# Patient Record
Sex: Female | Born: 1945
Health system: Southern US, Community
[De-identification: ages and names within clinical notes are randomized; demographics above are authoritative.]

## PROBLEM LIST (undated history)

## (undated) DIAGNOSIS — Z8719 Personal history of other diseases of the digestive system: Secondary | ICD-10-CM

## (undated) DIAGNOSIS — I10 Essential (primary) hypertension: Secondary | ICD-10-CM

## (undated) DIAGNOSIS — K219 Gastro-esophageal reflux disease without esophagitis: Secondary | ICD-10-CM

## (undated) DIAGNOSIS — IMO0002 Reserved for concepts with insufficient information to code with codable children: Secondary | ICD-10-CM

## (undated) DIAGNOSIS — C3490 Malignant neoplasm of unspecified part of unspecified bronchus or lung: Secondary | ICD-10-CM

## (undated) DIAGNOSIS — L309 Dermatitis, unspecified: Secondary | ICD-10-CM

## (undated) DIAGNOSIS — E785 Hyperlipidemia, unspecified: Secondary | ICD-10-CM

## (undated) DIAGNOSIS — C140 Malignant neoplasm of pharynx, unspecified: Secondary | ICD-10-CM

## (undated) DIAGNOSIS — R070 Pain in throat: Principal | ICD-10-CM

## (undated) DIAGNOSIS — A4902 Methicillin resistant Staphylococcus aureus infection, unspecified site: Secondary | ICD-10-CM

## (undated) DIAGNOSIS — Z923 Personal history of irradiation: Secondary | ICD-10-CM

## (undated) DIAGNOSIS — R112 Nausea with vomiting, unspecified: Secondary | ICD-10-CM

## (undated) DIAGNOSIS — D638 Anemia in other chronic diseases classified elsewhere: Principal | ICD-10-CM

## (undated) DIAGNOSIS — R110 Nausea: Principal | ICD-10-CM

## (undated) DIAGNOSIS — C01 Malignant neoplasm of base of tongue: Secondary | ICD-10-CM

## (undated) HISTORY — DX: Anemia in other chronic diseases classified elsewhere: D63.8

## (undated) HISTORY — PX: UPPER GASTROINTESTINAL ENDOSCOPY: SHX188

## (undated) HISTORY — DX: Essential (primary) hypertension: I10

## (undated) HISTORY — DX: Dermatitis, unspecified: L30.9

## (undated) HISTORY — DX: Hyperlipidemia, unspecified: E78.5

## (undated) HISTORY — DX: Malignant neoplasm of pharynx, unspecified: C14.0

## (undated) HISTORY — DX: Nausea with vomiting, unspecified: R11.2

## (undated) HISTORY — DX: Methicillin resistant Staphylococcus aureus infection, unspecified site: A49.02

## (undated) HISTORY — DX: Personal history of other diseases of the digestive system: Z87.19

## (undated) HISTORY — PX: OTHER SURGICAL HISTORY: SHX169

## (undated) HISTORY — DX: Nausea: R11.0

## (undated) HISTORY — DX: Reserved for concepts with insufficient information to code with codable children: IMO0002

## (undated) HISTORY — DX: Pain in throat: R07.0

## (undated) HISTORY — DX: Malignant neoplasm of base of tongue: C01

## (undated) HISTORY — PX: TONSILLECTOMY: SUR1361

## (undated) HISTORY — PX: INCONTINENCE SURGERY: SHX676

## (undated) HISTORY — PX: RECTOCELE REPAIR: SHX761

---

## 1988-09-26 HISTORY — PX: ABDOMINAL HYSTERECTOMY: SHX81

## 2004-09-09 ENCOUNTER — Encounter: Admission: RE | Admit: 2004-09-09 | Discharge: 2004-09-09 | Payer: Self-pay | Admitting: Occupational Medicine

## 2011-10-05 ENCOUNTER — Encounter (HOSPITAL_COMMUNITY): Payer: Self-pay | Admitting: Cardiology

## 2011-10-05 ENCOUNTER — Emergency Department (HOSPITAL_COMMUNITY)
Admission: EM | Admit: 2011-10-05 | Discharge: 2011-10-05 | Disposition: A | Discharge status: Home / Self Care | Payer: 59 | Source: Home / Self Care

## 2011-10-05 DIAGNOSIS — J069 Acute upper respiratory infection, unspecified: Secondary | ICD-10-CM

## 2011-10-05 DIAGNOSIS — J209 Acute bronchitis, unspecified: Secondary | ICD-10-CM

## 2011-10-05 MED ORDER — GUAIFENESIN-CODEINE 100-10 MG/5ML PO SYRP: ORAL_SOLUTION | ORAL | Status: AC

## 2011-10-05 MED ORDER — AMOXICILLIN-POT CLAVULANATE 875-125 MG PO TABS: 1.0000 | ORAL_TABLET | Freq: Two times a day (BID) | ORAL | Status: AC

## 2011-10-05 NOTE — ED Provider Notes (Signed)
History     CSN: [id number]  Arrival date & time 10/05/11  1109   None     Chief Complaint  Patient presents with  . Generalized Body Aches  . Cough  . Nasal Congestion  . Fever    (Consider location/radiation/quality/duration/timing/severity/associated sxs/prior treatment) HPI Comments: Pt presents with c/o cough, body aches and fever - onset 2 days ago . Cough is nonproductive. She denies dyspnea or wheezing. Her throat is painful only when she coughs. Clear rhinorrhea only from Rt nare. Sinus pressure - hx of chronic sinus problems and plans to see ENT regarding this. T Max 100.8 at home. Cough is disruptive to sleep. She has tried various otc cough medications without improvement.   The history is provided by the patient.    History reviewed. No pertinent past medical history.  Past Surgical History  Procedure Date  . Abdominal hysterectomy 1990    partial  . Incontinence surgery 1990, 4098,1191    No family history on file.  History  Substance Use Topics  . Smoking status: Never Smoker   . Smokeless tobacco: Not on file  . Alcohol Use: No    OB History    Grav Para Term Preterm Abortions TAB SAB Ect Mult Living                  Review of Systems  Constitutional: Positive for fever, chills and appetite change. Negative for fatigue.  HENT: Positive for rhinorrhea and sinus pressure. Negative for ear pain, sore throat, sneezing and postnasal drip.   Respiratory: Positive for cough. Negative for shortness of breath and wheezing.   Cardiovascular: Negative for chest pain and palpitations.  Gastrointestinal: Negative for nausea, vomiting, abdominal pain and diarrhea.  Neurological: Negative for headaches.    Allergies  Review of patient's allergies indicates no known allergies.  Home Medications   Current Outpatient Rx  Name Route Sig Dispense Refill  . CALCIUM + D PO Oral Take 2 tablets by mouth daily.    . IBUPROFEN 200 MG PO CAPS Oral Take 400 mg by  mouth daily.    . LUTEIN-ZEAXANTHIN-SELENIUM PO Oral Take 1 tablet by mouth daily.    . MULTIVITAMIN PO Oral Take 1 tablet by mouth daily.    Marland Kitchen OVER THE COUNTER MEDICATION  Raspberry ketones 2 tabs daily    . AMOXICILLIN-POT CLAVULANATE 875-125 MG PO TABS Oral Take 1 tablet by mouth 2 (two) times daily. 20 tablet 0  . GUAIFENESIN-CODEINE 100-10 MG/5ML PO SYRP  1-2 tsp every 6 hrs prn cough 120 mL 0    BP 151/76  Pulse 96  Temp(Src) 100.2 F (37.9 C) (Oral)  Resp 20  SpO2 100%  Physical Exam  Constitutional: She appears well-developed and well-nourished. No distress.  HENT:  Head: Normocephalic and atraumatic.  Right Ear: Tympanic membrane, external ear and ear canal normal.  Left Ear: Tympanic membrane, external ear and ear canal normal.  Nose: Nose normal. No mucosal edema.  Mouth/Throat: Uvula is midline, oropharynx is clear and moist and mucous membranes are normal. No oropharyngeal exudate, posterior oropharyngeal edema or posterior oropharyngeal erythema.       Bilat nasal turbs pale. Small amount of clear mucus bilat also.   Neck: Neck supple.  Cardiovascular: Normal rate, regular rhythm and normal heart sounds.   Pulmonary/Chest: Effort normal and breath sounds normal. No respiratory distress.  Lymphadenopathy:    She has no cervical adenopathy.  Neurological: She is alert.  Skin: Skin is warm and dry.  Psychiatric: She has a normal mood and affect.    ED Course  Procedures (including critical care time)  Labs Reviewed - No data to display No results found.   1. Acute URI   2. Acute bronchitis       MDM   Viral respiratory symptoms with fever - onset 2 days ago.        Melody Comas, [place] 10/05/11 1527

## 2011-10-05 NOTE — ED Notes (Addendum)
Pt reports hackey cough, nasal congestion, body aches and fever since Monday. Nasal drainage is clear. Scattered expiratory to right lung fields. Has been taking Mucinex, advil, cough syrup with no relief. Temp 100.8 at home this morning.

## 2011-10-05 NOTE — ED Provider Notes (Signed)
Medical screening examination/treatment/procedure(s) were performed by non-physician practitioner and as supervising physician I was immediately available for consultation/collaboration.  Marquavion Venhuizen   Emalynn Clewis, MD 10/05/11 1723 

## 2011-10-17 ENCOUNTER — Ambulatory Visit (INDEPENDENT_AMBULATORY_CARE_PROVIDER_SITE_OTHER): Payer: 59 | Admitting: Family Medicine

## 2011-10-17 ENCOUNTER — Encounter: Payer: Self-pay | Admitting: Family Medicine

## 2011-10-17 VITALS — BP 192/90 | Temp 99.1°F | Ht 66.0 in | Wt 178.0 lb

## 2011-10-17 DIAGNOSIS — E785 Hyperlipidemia, unspecified: Secondary | ICD-10-CM

## 2011-10-17 DIAGNOSIS — R51 Headache: Secondary | ICD-10-CM

## 2011-10-17 DIAGNOSIS — R519 Headache, unspecified: Secondary | ICD-10-CM | POA: Insufficient documentation

## 2011-10-17 DIAGNOSIS — D569 Thalassemia, unspecified: Secondary | ICD-10-CM | POA: Insufficient documentation

## 2011-10-17 DIAGNOSIS — E663 Overweight: Secondary | ICD-10-CM

## 2011-10-17 DIAGNOSIS — N390 Urinary tract infection, site not specified: Secondary | ICD-10-CM

## 2011-10-17 DIAGNOSIS — I10 Essential (primary) hypertension: Secondary | ICD-10-CM

## 2011-10-17 DIAGNOSIS — R300 Dysuria: Secondary | ICD-10-CM

## 2011-10-17 DIAGNOSIS — D563 Thalassemia minor: Secondary | ICD-10-CM

## 2011-10-17 DIAGNOSIS — Z0000 Encounter for general adult medical examination without abnormal findings: Secondary | ICD-10-CM

## 2011-10-17 LAB — POCT URINALYSIS DIPSTICK
Bilirubin, UA: NEGATIVE
Glucose, UA: NEGATIVE
Ketones, UA: NEGATIVE
Nitrite, UA: NEGATIVE
Protein, UA: NEGATIVE
Spec Grav, UA: 1.02
Urobilinogen, UA: 0.2
pH, UA: 7

## 2011-10-17 LAB — COMPREHENSIVE METABOLIC PANEL
ALT: 25 U/L (ref 0–35)
AST: 21 U/L (ref 0–37)
Albumin: 4.3 g/dL (ref 3.5–5.2)
Alkaline Phosphatase: 90 U/L (ref 39–117)
BUN: 17 mg/dL (ref 6–23)
CO2: 29 mEq/L (ref 19–32)
Calcium: 9.5 mg/dL (ref 8.4–10.5)
Chloride: 101 mEq/L (ref 96–112)
Creat: 0.64 mg/dL (ref 0.50–1.10)
Glucose, Bld: 111 mg/dL — ABNORMAL HIGH (ref 70–99)
Potassium: 4 mEq/L (ref 3.5–5.3)
Sodium: 136 mEq/L (ref 135–145)
Total Bilirubin: 0.6 mg/dL (ref 0.3–1.2)
Total Protein: 6.5 g/dL (ref 6.0–8.3)

## 2011-10-17 LAB — CBC
HCT: 35.9 % — ABNORMAL LOW (ref 36.0–46.0)
Hemoglobin: 11 g/dL — ABNORMAL LOW (ref 12.0–15.0)
MCH: 19.3 pg — ABNORMAL LOW (ref 26.0–34.0)
MCHC: 30.6 g/dL (ref 30.0–36.0)
MCV: 63 fL — ABNORMAL LOW (ref 78.0–100.0)
Platelets: 413 10*3/uL — ABNORMAL HIGH (ref 150–400)
RBC: 5.7 MIL/uL — ABNORMAL HIGH (ref 3.87–5.11)
RDW: 15.4 % (ref 11.5–15.5)
WBC: 7.9 10*3/uL (ref 4.0–10.5)

## 2011-10-17 LAB — LIPID PANEL
HDL: 42 mg/dL (ref >39)
LDL Cholesterol: 94 mg/dL (ref 0–99)
Total CHOL/HDL Ratio: 3.5 Ratio
Triglycerides: 57 mg/dL (ref <150)
VLDL: 11 mg/dL (ref 0–40)

## 2011-10-17 LAB — POCT UA - MICROSCOPIC ONLY

## 2011-10-17 MED ORDER — CEPHALEXIN 500 MG PO CAPS: 500.0000 mg | ORAL_CAPSULE | Freq: Two times a day (BID) | ORAL | Status: AC

## 2011-10-17 MED ORDER — LISINOPRIL 5 MG PO TABS: 5.0000 mg | ORAL_TABLET | Freq: Every day | ORAL | Status: DC

## 2011-10-17 NOTE — Assessment & Plan Note (Signed)
Pt reports frequent headaches.  Taking motrin daily.  Most likely having medication overuse headache.  Need to discuss in more detail at f/up appt.

## 2011-10-17 NOTE — Progress Notes (Signed)
Subjective:    Patient ID: Debra Farrell, female    DOB: [birth year], 66 y.o.   MRN: [id number]  HPI Here for new patient complete physical:  PMH, FH, surg hx and SH documented under the appropriate tabs in chart: Has not had a PCP in years.   1)UTI symptoms: Dysuria/buring with urination x 3 days.  Not improving.  Pain worse at end of stream.  Some frequency and occasional retention symptoms.  No back pain.  No fever.  Nothings seems to be helping it.  Drinking lot so fluids.  Has h/o uti's and has had bladder surgery to tack bladder and remove calcium deposit in bladder in past.    2) HTN: [facility] that bp was elevated.  Has only checked occasional over the past 2 years.  Sometimes sbp 170 the lowest she has seen it was 135.  Doesn't check frequently.  Knows that she most likely will need bp agent and is ok with starting something low.  Not tracking bp at home.  No dizziness.  Occasional headache.  No syncope.   3)Health maintanence: Up to date on tetanus and flu vaccine.  Has not had mammogram or colonoscopy. Has not noticed any breast lesions or nodules.  No dark or bloody stools.  Also has not had shingles vaccine.  Agrees to screening lipid panel.  Also agrees to cbc check since pmh of thalassemia minor.  Also agrees to CMET.     Review of Systems As per above.     Objective:   Physical Exam  Constitutional: She is oriented to person, place, and time. She appears well-developed and well-nourished.  HENT:  Head: Normocephalic and atraumatic.  Right Ear: External ear normal.  Left Ear: External ear normal.       TM normal bilateral.  Diffuse dental caries.   Cardiovascular: Normal rate, regular rhythm and normal heart sounds.   No murmur heard. Pulmonary/Chest: Effort normal. No respiratory distress. She has no wheezes. She has no rales.  Abdominal: Soft. She exhibits no distension. There is tenderness. There is no rebound and no guarding.       No CVA  tenderness.  Musculoskeletal: She exhibits no edema.  Neurological: She is alert and oriented to person, place, and time.  Skin: No rash noted.  Psychiatric: She has a normal mood and affect. Her behavior is normal.          Assessment & Plan:

## 2011-10-17 NOTE — Assessment & Plan Note (Signed)
Creatine drawn today for baseline.  Started lisinopril 5mg  (pt wanted to start low).  Pt to return in 2 weeks for f/up and titration up of medication.

## 2011-10-17 NOTE — Assessment & Plan Note (Signed)
CBC to obtain baseline hemoglobin.

## 2011-10-17 NOTE — Assessment & Plan Note (Addendum)
Up to date on tetanus and flu vaccine. Due for mammogram- gave handout Due for colonoscopy-- will put in order Due for shingles vaccine-- gave handout, pt to let me know if she would like me to send in Rx to pharmacy Fasting glucose obtained via CBC panel to screen for diabetes- no family history of diabetes.

## 2011-10-17 NOTE — Assessment & Plan Note (Signed)
+   dysuria. Small leuks, and trace blood.  Urine culture sent.  Will treat with keflex x 7 days.  Pt to return if no improvement within 3 days or if any new or worsening of symptoms.  No signs/symptoms of pyelo at this time.

## 2011-10-17 NOTE — Assessment & Plan Note (Signed)
Pt expreses concern about weight gain.  Would like to discuss in more detail at future appt.

## 2011-10-17 NOTE — Assessment & Plan Note (Signed)
2 years ago pt diagnosed with hyperlipidemia at [facility].  Recheck lipid panel today.

## 2011-10-17 NOTE — Patient Instructions (Signed)
UTI: Keflex as directed.  I will mail or call you with the result.  HTN: Lisinopril and keep bp logbook.  Blood work I will mail it to you.   Health screenings: Read about shingles vaccine, I will order colonoscopy, Schedule mammogram.

## 2011-10-18 ENCOUNTER — Encounter: Payer: Self-pay | Admitting: Family Medicine

## 2011-10-19 ENCOUNTER — Other Ambulatory Visit: Payer: Self-pay | Admitting: Family Medicine

## 2011-10-19 DIAGNOSIS — Z1231 Encounter for screening mammogram for malignant neoplasm of breast: Secondary | ICD-10-CM

## 2011-10-20 ENCOUNTER — Encounter: Payer: Self-pay | Admitting: Family Medicine

## 2011-10-20 LAB — URINE CULTURE

## 2011-10-31 ENCOUNTER — Ambulatory Visit (INDEPENDENT_AMBULATORY_CARE_PROVIDER_SITE_OTHER): Payer: 59 | Admitting: Family Medicine

## 2011-10-31 ENCOUNTER — Telehealth: Payer: Self-pay | Admitting: Family Medicine

## 2011-10-31 DIAGNOSIS — N390 Urinary tract infection, site not specified: Secondary | ICD-10-CM

## 2011-10-31 DIAGNOSIS — I10 Essential (primary) hypertension: Secondary | ICD-10-CM

## 2011-10-31 DIAGNOSIS — Z0000 Encounter for general adult medical examination without abnormal findings: Secondary | ICD-10-CM

## 2011-10-31 DIAGNOSIS — E663 Overweight: Secondary | ICD-10-CM

## 2011-10-31 MED ORDER — ASPIRIN 81 MG PO CHEW: 81.0000 mg | CHEWABLE_TABLET | Freq: Every day | ORAL | Status: AC

## 2011-10-31 NOTE — Patient Instructions (Addendum)
Health maintanence: My nurses will let you know when your GI appointment.   Blood pressure: Increase lisinopril to 10mg  daily.  Our goal blood pressure is 140/90.  Work on exercise program/ exercises goals for cold weather  Headaches: Stop taking daily motrin.    Return back to see me last week of February--

## 2011-10-31 NOTE — Progress Notes (Signed)
Subjective:    Patient ID: Debra Farrell, female    DOB: 1946/07/02, 66 y.o.   MRN: [id number]  HPI followup blood pressure: Patient has been taking lisinopril daily 5 mg. Patient states her blood pressure has been running 150 to 190s over 80s to 90s. Patient pressure today on exam 165/74. Patient states that she has not noticed any side effects. No dizziness. No vision changes. Some decreased energy she's not sure this is do to the above-mentioned medication.  Headaches: Patient takes Motrin 400 mg daily in the morning to prevent headache. Tends to have an evening headache about 4 days of the week. States this is not very severe pain. States in the morning she has pressure on the top her temples and some tightness in the back or neck and this is what causes her to take a preventative dose of Motrin.  No changes in vision. No severe headaches. No aura. No nausea or vomiting. No days missing work due to headaches.  UTI followup: Patient states that she has no further symptoms of urinary tract infection. No dysuria. No fever. No back pain. No abdominal pain.to all antibiotic as directed.  Health maintenance: Patient has mammogram scheduled for this week. Patient requesting shingles vaccine prescription. Patient to be scheduled for colonoscopy.     Review of Systems    as per above. Objective:   Physical Exam  Constitutional: She appears well-developed and well-nourished.  HENT:  Head: Normocephalic and atraumatic.  Cardiovascular: Normal rate, regular rhythm and normal heart sounds.   No murmur heard. Pulmonary/Chest: Effort normal. No respiratory distress. She has no wheezes.  Abdominal: Soft. She exhibits no distension.  Musculoskeletal: She exhibits no edema.  Neurological: She is alert. She displays normal reflexes. No cranial nerve deficit. She exhibits normal muscle tone. Coordination normal.  Skin: No rash noted.  Psychiatric: She has a normal mood and affect.            Assessment & Plan:

## 2011-10-31 NOTE — Telephone Encounter (Signed)
Pt was here this AM and Caviness was going to increase her Lisinopril and the pharmacy still hasn't gotten the orders.  [facility]

## 2011-10-31 NOTE — Telephone Encounter (Signed)
Spoke with patient and she does have lisinopril 5 mg  on hand . Advised her to use these taking 2 at the time and will send message to Dr. Edmonia James to send in the 10 mg lisinopril in to pharmacy. Patient agreeable.

## 2011-11-01 ENCOUNTER — Other Ambulatory Visit: Payer: Self-pay | Admitting: Family Medicine

## 2011-11-01 ENCOUNTER — Telehealth: Payer: Self-pay | Admitting: Family Medicine

## 2011-11-01 MED ORDER — LISINOPRIL 10 MG PO TABS: 5.0000 mg | ORAL_TABLET | Freq: Every day | ORAL | Status: DC

## 2011-11-01 NOTE — Telephone Encounter (Signed)
Patient notified

## 2011-11-01 NOTE — Telephone Encounter (Signed)
LMOM advising pt that she can sched colonoscopy herself. Left # for Harper on machine.

## 2011-11-01 NOTE — Telephone Encounter (Signed)
rx sent to pharmacy

## 2011-11-01 NOTE — Telephone Encounter (Signed)
Message copied by Barnie Alderman on Tue Nov 01, 2011  4:58 PM ------      Message from: Darci Needle      Created: Tue Nov 01, 2011  4:44 PM                   ----- Message -----         From: Ellin Mayhew, MD         Sent: 10/31/2011  11:18 AM           To: [facility]            Order placed for GI consult for colonoscopy at her last appt.  This has not been scheduled.  Can you please check on this referral and give pt a call regarding when her appt is scheduled.  Thanks, [employer]

## 2011-11-02 ENCOUNTER — Ambulatory Visit
Admission: RE | Admit: 2011-11-02 | Discharge: 2011-11-02 | Disposition: A | Discharge status: Home / Self Care | Payer: 59 | Source: Ambulatory Visit | Attending: Family Medicine | Admitting: Family Medicine

## 2011-11-02 DIAGNOSIS — Z1231 Encounter for screening mammogram for malignant neoplasm of breast: Secondary | ICD-10-CM

## 2011-11-02 NOTE — Assessment & Plan Note (Addendum)
Increase lisinopril to 10mg  daily.  Pt to monitor bp and return for recheck in 2-3 weeks. Consider recheck of creatinine at next appt.

## 2011-11-02 NOTE — Assessment & Plan Note (Signed)
Now resolved.  No further symtoms.  No further intervention needed at this time.

## 2011-11-02 NOTE — Assessment & Plan Note (Signed)
Need to discuss in more detail at f/up appt.

## 2011-11-02 NOTE — Assessment & Plan Note (Addendum)
Patient has mammogram scheduled for this week. Patient requesting shingles vaccine prescription- gave rx. Patient to be scheduled for colonoscopy.

## 2011-11-22 ENCOUNTER — Encounter: Payer: Self-pay | Admitting: Family Medicine

## 2011-11-22 ENCOUNTER — Ambulatory Visit (INDEPENDENT_AMBULATORY_CARE_PROVIDER_SITE_OTHER): Payer: 59 | Admitting: Family Medicine

## 2011-11-22 DIAGNOSIS — I10 Essential (primary) hypertension: Secondary | ICD-10-CM

## 2011-11-22 DIAGNOSIS — R51 Headache: Secondary | ICD-10-CM

## 2011-11-22 DIAGNOSIS — R739 Hyperglycemia, unspecified: Secondary | ICD-10-CM

## 2011-11-22 DIAGNOSIS — E663 Overweight: Secondary | ICD-10-CM

## 2011-11-22 DIAGNOSIS — R7309 Other abnormal glucose: Secondary | ICD-10-CM

## 2011-11-22 LAB — BASIC METABOLIC PANEL
BUN: 12 mg/dL (ref 6–23)
CO2: 29 mEq/L (ref 19–32)
Calcium: 10.1 mg/dL (ref 8.4–10.5)
Chloride: 104 mEq/L (ref 96–112)
Glucose, Bld: 102 mg/dL — ABNORMAL HIGH (ref 70–99)
Potassium: 4.1 mEq/L (ref 3.5–5.3)
Sodium: 140 mEq/L (ref 135–145)

## 2011-11-22 MED ORDER — LOSARTAN POTASSIUM 50 MG PO TABS: 50.0000 mg | ORAL_TABLET | Freq: Every day | ORAL | Status: DC

## 2011-11-22 NOTE — Patient Instructions (Signed)
Blood pressure: Stop lisinopril.  Start losartan. Return in 2 weeks for bp with nurse.  Make nurse appointment.   Headaches: Keep a headache log. See form. Do neck exercises. Back Exercises  Weight management: Consider walking at mall.  Consider getting membership at [facility].  Get a cold weather and raining day exercise plan.   Return in 1 month for follow up on headaches and blood pressure.      These exercises may help you when beginning to rehabilitate your injury. Your symptoms may resolve with or without further involvement from your physician, physical therapist or athletic trainer. While completing these exercises, remember:   Restoring tissue flexibility helps normal motion to return to the joints. This allows healthier, less painful movement and activity.   An effective stretch should be held for at least 30 seconds.   A stretch should never be painful. You should only feel a gentle lengthening or release in the stretched tissue.  STRETCH - Extension, Prone on Elbows   Lie on your stomach on the floor, a bed will be too soft. Place your palms about shoulder width apart and at the height of your head.   Place your elbows under your shoulders. If this is too painful, stack pillows under your chest.   Allow your body to relax so that your hips drop lower and make contact more completely with the floor.   Hold this position for __________ seconds.   Slowly return to lying flat on the floor.  Repeat __________ times. Complete this exercise __________ times per day.  RANGE OF MOTION - Extension, Prone Press Ups   Lie on your stomach on the floor, a bed will be too soft. Place your palms about shoulder width apart and at the height of your head.   Keeping your back as relaxed as possible, slowly straighten your elbows while keeping your hips on the floor. You may adjust the placement of your hands to maximize your comfort. As you gain motion, your hands will come more  underneath your shoulders.   Hold this position __________ seconds.   Slowly return to lying flat on the floor.  Repeat __________ times. Complete this exercise __________ times per day.  RANGE OF MOTION- Quadruped, Neutral Spine   Assume a hands and knees position on a firm surface. Keep your hands under your shoulders and your knees under your hips. You may place padding under your knees for comfort.   Drop your head and point your tail bone toward the ground below you. This will round out your low back like an angry cat. Hold this position for __________ seconds.   Slowly lift your head and release your tail bone so that your back sags into a large arch, like an old horse.   Hold this position for __________ seconds.   Repeat this until you feel limber in your low back.   Now, find your "sweet spot." This will be the most comfortable position somewhere between the two previous positions. This is your neutral spine. Once you have found this position, tense your stomach muscles to support your low back.   Hold this position for __________ seconds.  Repeat __________ times. Complete this exercise __________ times per day.  STRETCH - Flexion, Single Knee to Chest   Lie on a firm bed or floor with both legs extended in front of you.   Keeping one leg in contact with the floor, bring your opposite knee to your chest. Hold your leg in place  by either grabbing behind your thigh or at your knee.   Pull until you feel a gentle stretch in your low back. Hold __________ seconds.   Slowly release your grasp and repeat the exercise with the opposite side.  Repeat __________ times. Complete this exercise __________ times per day.  STRETCH - Hamstrings, Standing  Stand or sit and extend your right / left leg, placing your foot on a chair or foot stool   Keeping a slight arch in your low back and your hips straight forward.   Lead with your chest and lean forward at the waist until you feel a  gentle stretch in the back of your right / left knee or thigh. (When done correctly, this exercise requires leaning only a small distance.)   Hold this position for __________ seconds.  Repeat __________ times. Complete this stretch __________ times per day. STRENGTHENING - Deep Abdominals, Pelvic Tilt   Lie on a firm bed or floor. Keeping your legs in front of you, bend your knees so they are both pointed toward the ceiling and your feet are flat on the floor.   Tense your lower abdominal muscles to press your low back into the floor. This motion will rotate your pelvis so that your tail bone is scooping upwards rather than pointing at your feet or into the floor.   With a gentle tension and even breathing, hold this position for __________ seconds.  Repeat __________ times. Complete this exercise __________ times per day.  STRENGTHENING - Abdominals, Crunches   Lie on a firm bed or floor. Keeping your legs in front of you, bend your knees so they are both pointed toward the ceiling and your feet are flat on the floor. Cross your arms over your chest.   Slightly tip your chin down without bending your neck.   Tense your abdominals and slowly lift your trunk high enough to just clear your shoulder blades. Lifting higher can put excessive stress on the low back and does not further strengthen your abdominal muscles.   Control your return to the starting position.  Repeat __________ times. Complete this exercise __________ times per day.  STRENGTHENING - Quadruped, Opposite UE/LE Lift   Assume a hands and knees position on a firm surface. Keep your hands under your shoulders and your knees under your hips. You may place padding under your knees for comfort.   Find your neutral spine and gently tense your abdominal muscles so that you can maintain this position. Your shoulders and hips should form a rectangle that is parallel with the floor and is not twisted.   Keeping your trunk steady,  lift your right hand no higher than your shoulder and then your left leg no higher than your hip. Make sure you are not holding your breath. Hold this position __________ seconds.   Continuing to keep your abdominal muscles tense and your back steady, slowly return to your starting position. Repeat with the opposite arm and leg.  Repeat __________ times. Complete this exercise __________ times per day. Document Released: 09/30/2005 Document Revised: 05/25/2011 Document Reviewed: 12/25/2008 [facility] Patient Information 2012 [place], [place].

## 2011-11-27 ENCOUNTER — Encounter: Payer: Self-pay | Admitting: Family Medicine

## 2011-11-27 NOTE — Assessment & Plan Note (Signed)
Encouraged pt to find a fun way to incorporate exercise in to daily life.  Also encouraged health diet and possible RD consult.  Pt states she will think about it and get me.

## 2011-11-27 NOTE — Assessment & Plan Note (Signed)
Since possible reaction to lisinopril.  Will d/c lisinopril and start losartan at this time.  Pt to continue to monitor bp and return in 2-4 weeks for recheck.

## 2011-11-27 NOTE — Progress Notes (Signed)
Subjective:    Patient ID: Debra Farrell, female    DOB: January 15, 1946, 66 y.o.   MRN: [id number]  HPI Blood pressure f/up: Pt taking lisinopril as directed.  BP elevated consistently on BP log- BP ranging from 150/65- 181/94.  No vision changes. No dizziness. No syncope.  Pt states she has noticed a dry cough and a tickle in her throat since starting on the lisinopril.  Is concerned that this may be a side effect.   H/A: Has stopped taking daily motrin x 1 month now and continues to have a daily headache.  Usually occurs 1 hour after getting up, usually located in back of neck.  No n/v.  No syncope .  descrives h/a pain as mild to moderate.    Weight: Trying to eat healthier.  Has not been exercising.  States that it is too cold to exercise.    Review of Systems As per above.     Objective:   Physical Exam  Constitutional: She appears well-developed and well-nourished.  HENT:  Head: Normocephalic and atraumatic.  Cardiovascular: Normal rate, regular rhythm and normal heart sounds.   No murmur heard. Pulmonary/Chest: Effort normal and breath sounds normal. No respiratory distress.  Abdominal: Soft. She exhibits no distension. There is no tenderness.  Musculoskeletal: She exhibits no edema.  Neurological: She is alert. She displays normal reflexes. No cranial nerve deficit. She exhibits normal muscle tone. Coordination normal.  Skin: No rash noted.  Psychiatric: She has a normal mood and affect.          Assessment & Plan:

## 2011-11-27 NOTE — Assessment & Plan Note (Signed)
Cause of headache unclear.  May be medication overuse headache.  But usally improves after 1 month of non use.  H/a may be 2/2 neck muscles tension.  Pt to do neck exercises and keep headache log.  Return in1 month for follow up.

## 2011-12-05 ENCOUNTER — Ambulatory Visit (INDEPENDENT_AMBULATORY_CARE_PROVIDER_SITE_OTHER): Payer: 59 | Admitting: *Deleted

## 2011-12-05 VITALS — BP 138/70 | HR 76

## 2011-12-05 DIAGNOSIS — I10 Essential (primary) hypertension: Secondary | ICD-10-CM

## 2011-12-05 NOTE — Progress Notes (Signed)
In for BP check . BP checked manually using regular adult cuff.   BP RA 140/70 and LA 138/70 pulse 76. Taking medication as directed. Has follow up appointment with PCP in 12/21/2011. Denis any problem with new medication.

## 2011-12-21 ENCOUNTER — Encounter: Payer: Self-pay | Admitting: Family Medicine

## 2011-12-21 ENCOUNTER — Ambulatory Visit (INDEPENDENT_AMBULATORY_CARE_PROVIDER_SITE_OTHER): Payer: 59 | Admitting: Family Medicine

## 2011-12-21 VITALS — BP 168/89 | HR 74 | Ht 67.0 in | Wt 175.2 lb

## 2011-12-21 DIAGNOSIS — R51 Headache: Secondary | ICD-10-CM

## 2011-12-21 DIAGNOSIS — I10 Essential (primary) hypertension: Secondary | ICD-10-CM

## 2011-12-21 DIAGNOSIS — E663 Overweight: Secondary | ICD-10-CM

## 2011-12-21 LAB — BASIC METABOLIC PANEL
CO2: 28 mEq/L (ref 19–32)
Calcium: 9.5 mg/dL (ref 8.4–10.5)
Chloride: 103 mEq/L (ref 96–112)
Creat: 0.64 mg/dL (ref 0.50–1.10)
Glucose, Bld: 98 mg/dL (ref 70–99)
Potassium: 4 mEq/L (ref 3.5–5.3)
Sodium: 138 mEq/L (ref 135–145)

## 2011-12-21 MED ORDER — LOSARTAN POTASSIUM 100 MG PO TABS: 100.0000 mg | ORAL_TABLET | Freq: Every day | ORAL | Status: DC

## 2011-12-21 NOTE — Progress Notes (Signed)
Subjective:    Patient ID: Debra Farrell, female    DOB: 06-Nov-1945, 66 y.o.   MRN: [id number]  HPI Headache: Patient states that her headaches seem to be related to stress. Did not bring in headache logbook today but states that she has a daily headache when she wakes up most days. Also has headaches that occur anytime she has stress throughout the day. She has avoided the use of Tylenol or Motrin except for very severe headaches. Has only taken one dose of Motrin during the past month. If she can find a way to relax her headache usually resolves. She identifies life stressors most related to family issues. Has not identified any food triggers. No light sensitivity. No noise sensitivity. No nausea. No vomiting. Able to go to work and has not taken any 6 days due to headaches.  Blood pressure: Blood pressure ranges from systolics of 140s to 170s at home. Has been taking losartan but does occasionally miss doses. No vision changes. No syncope. No dizziness.  Headache as per above.  Weight management: Patient eats 3 meals per day. Is cutting back on portion sizes. Has lost 3 pounds since last appointment. Exercises 3-4 times per week.-For 1 hour at a time. Usually walks.    Review of Systems As per above.    Objective:   Physical Exam  Constitutional: She appears well-developed and well-nourished.  HENT:  Head: Normocephalic and atraumatic.  Eyes: Pupils are equal, round, and reactive to light.  Neck: Normal range of motion. Neck supple.  Cardiovascular: Normal rate, regular rhythm and normal heart sounds.   No murmur heard. Pulmonary/Chest: Effort normal. No respiratory distress. She has no wheezes.  Abdominal: Soft. She exhibits no distension.  Musculoskeletal: She exhibits no edema.  Neurological: She is alert. She displays normal reflexes. No cranial nerve deficit. She exhibits normal muscle tone. Coordination normal.  Skin: No rash noted.  Psychiatric: She has a normal mood and  affect.          Assessment & Plan:

## 2011-12-21 NOTE — Patient Instructions (Signed)
Weight management: Make a list of fruits/veggies/ and meats that you like. A list of those you will not try. A list of those you don't eat , but are open to trying.  Keep up the great work with walking. Think of other new ways to be active so you won't be sore bored. Look at plate method handout.    Blood pressure: Losartan increase to 100mg  daily.  We will check your kidney function today.  Headaches: See headache triggers handout  See relaxation technique handout.

## 2011-12-22 ENCOUNTER — Encounter: Payer: Self-pay | Admitting: Family Medicine

## 2011-12-22 NOTE — Assessment & Plan Note (Signed)
Pt goals are as follows: Make a list of fruits/veggies/ and meats that you like. A list of those you will not try. A list of those you don't eat , but are open to trying.  Keep up the great work with walking. Think of other new ways to be active so you won't be sore bored. Look at plate method handout.

## 2011-12-22 NOTE — Assessment & Plan Note (Signed)
Cause of headache unclear.  Pt to continue to use motrin only for severe headaches.  There does seem to be a stress component to headaches.  Handout give to patient regarding headache food triggers, also gave handout on relaxation techniques.  Encouraged pt to complete headache logbook and bring to next appointment.

## 2011-12-22 NOTE — Assessment & Plan Note (Signed)
168/89--also elevated at home.  Will increase losartan to 100mg  daily.  Pt agrees with this plan.  Pt requests that I recheck renal function today.

## 2012-01-26 ENCOUNTER — Encounter: Payer: Self-pay | Admitting: Family Medicine

## 2012-01-26 ENCOUNTER — Ambulatory Visit (INDEPENDENT_AMBULATORY_CARE_PROVIDER_SITE_OTHER): Payer: 59 | Admitting: Family Medicine

## 2012-01-26 VITALS — BP 166/86 | HR 76 | Ht 67.0 in | Wt 174.0 lb

## 2012-01-26 DIAGNOSIS — I10 Essential (primary) hypertension: Secondary | ICD-10-CM

## 2012-01-26 DIAGNOSIS — E663 Overweight: Secondary | ICD-10-CM

## 2012-01-26 MED ORDER — HYDROCHLOROTHIAZIDE 25 MG PO TABS: 25.0000 mg | ORAL_TABLET | Freq: Every day | ORAL | Status: DC

## 2012-01-26 MED ORDER — LOSARTAN POTASSIUM 100 MG PO TABS: 100.0000 mg | ORAL_TABLET | Freq: Every day | ORAL | Status: DC

## 2012-01-26 NOTE — Progress Notes (Signed)
Subjective:     Patient ID: Debra Farrell, female   DOB: Apr 18, 1946, 66 y.o.   MRN: [id number]  HPI Here for f/u on HTN. On BP recheck, R arm: 152/88 L arm: 158/80  Her BP is always above 140 in both arms at home. Has been on Losartan 100mg  daily, no AE.no cough.  No syncope, dizziness, vision change.  Does not smoke, drink alcohol, no added salt in diet. She is unsure if her parents had high blood pressure. No known history of renal artery stenosis. Feels well, no recent illness.  Weight management: Ms. Davia continues to lose weight. Lost 1.5 lbs since last visit 1 mo ago. Has increased fruits/vegetables and watches her intake. Expects to increase activity when weather gets better.  Review of Systems As per above    Objective:   Physical Exam Gen: well-appearing, pleasant HEENT: ncat, mmm, conjunctiva clear, oropharynx clear. No carotid bruits CV: RRR, no m/r/g Lungs: CTAB, no increased wob Abdomen: soft, non-tender. No renal artery bruits.    Assessment:         Plan:

## 2012-01-28 NOTE — Assessment & Plan Note (Signed)
Continue to work on diet and exercise goals.

## 2012-01-28 NOTE — Assessment & Plan Note (Signed)
Debra Farrell is following up on her BP. BP still high today, even with losartan 100mg  daily. Will add HCTZ 25mg  today and f/u in 3-4 weeks.

## 2012-02-21 ENCOUNTER — Encounter: Payer: Self-pay | Admitting: Family Medicine

## 2012-02-21 ENCOUNTER — Ambulatory Visit (INDEPENDENT_AMBULATORY_CARE_PROVIDER_SITE_OTHER): Payer: 59 | Admitting: Family Medicine

## 2012-02-21 VITALS — BP 133/75 | HR 71 | Ht 67.0 in | Wt 172.0 lb

## 2012-02-21 DIAGNOSIS — E663 Overweight: Secondary | ICD-10-CM

## 2012-02-21 DIAGNOSIS — Z0000 Encounter for general adult medical examination without abnormal findings: Secondary | ICD-10-CM

## 2012-02-21 DIAGNOSIS — I10 Essential (primary) hypertension: Secondary | ICD-10-CM

## 2012-02-21 MED ORDER — HYDROCHLOROTHIAZIDE 25 MG PO TABS: 25.0000 mg | ORAL_TABLET | Freq: Every day | ORAL | Status: DC

## 2012-02-21 MED ORDER — LOSARTAN POTASSIUM 100 MG PO TABS: 100.0000 mg | ORAL_TABLET | Freq: Every day | ORAL | Status: DC

## 2012-02-21 NOTE — Progress Notes (Signed)
Subjective:    Patient ID: Debra Farrell, female    DOB: [birth year], 66 y.o.   MRN: [id number]  HPI Hypertension followup: Patient states she's taking Cozaar and HCTZ as directed. Tolerating medications well without side effects. Blood pressure has been in 140s systolic up to 160 systolic at home. Using home automatic cuff. Does not check at work. Today blood pressure 130/70 in right arm. And 133/75 and left arm. Confirmed with manual check. No dizziness. No syncope. No vision changes. No chest pain. No shortness of breath.  Weight management followup: Patient has lost 2 pounds since last appointment early May. Hasn't increase activity-active at least 30 minutes daily. Doesn't long work, walking, cleaning. Has decreased portion sizes. Has increased fruits and vegetables in her diet. Patient states that she feels great.  Health maintenance: Patient states that she has not yet scheduled colonoscopy. Trying to find someone that can help her with transportation and care for her after procedure.    Smoking status reviewed.  Review of Systems As per above.    Objective:   Physical Exam        Assessment & Plan:

## 2012-02-21 NOTE — Patient Instructions (Signed)
Hypertension: Keep taking meds as directed. Keep up the great exercise, health diet, and weight loss. Continue to check bp at home, maybe at work.   Remember your goal bp is less than 140/90.

## 2012-02-23 NOTE — Assessment & Plan Note (Signed)
Pt lost 2 lbs since last visit with portion control and increasing exercise- discussed that we have a nutritionist as resource here at program.  Pt aware but doesn't want referral at this time.

## 2012-02-23 NOTE — Assessment & Plan Note (Signed)
bp wnl at todays appt.  I am not sure why readings are elevated at home and not here.  Pt to check at work on the machines there to see how they are running.  Pt to also check and make sure that home machine is working correctly.  Can bring to next appt and compare against our manual cuff.  Will continue current bp medications- no changes today.  Pt to continue exercise and weightloss efforts.

## 2012-02-23 NOTE — Assessment & Plan Note (Signed)
Due for colonoscopy- pt is in the process of getting this scheduled.

## 2012-04-05 ENCOUNTER — Ambulatory Visit (INDEPENDENT_AMBULATORY_CARE_PROVIDER_SITE_OTHER): Payer: 59 | Admitting: Family Medicine

## 2012-04-05 ENCOUNTER — Encounter: Payer: Self-pay | Admitting: Family Medicine

## 2012-04-05 VITALS — BP 105/70 | HR 88 | Ht 68.0 in | Wt 171.0 lb

## 2012-04-05 DIAGNOSIS — M25561 Pain in right knee: Secondary | ICD-10-CM

## 2012-04-05 DIAGNOSIS — E663 Overweight: Secondary | ICD-10-CM

## 2012-04-05 DIAGNOSIS — M25569 Pain in unspecified knee: Secondary | ICD-10-CM

## 2012-04-05 DIAGNOSIS — I10 Essential (primary) hypertension: Secondary | ICD-10-CM

## 2012-04-05 MED ORDER — MELOXICAM 15 MG PO TABS: 7.5000 mg | ORAL_TABLET | Freq: Every day | ORAL | Status: AC

## 2012-04-05 NOTE — Assessment & Plan Note (Signed)
Encouraged continued diet modifications. Continue to work towards increasing exercise to help with weight and bp.

## 2012-04-05 NOTE — Progress Notes (Signed)
Subjective:    Patient ID: Debra Farrell, female    DOB: 05-May-1946, 66 y.o.   MRN: [id number]  HPI Blood pressure followup: On arrival left arm blood pressure 105/70. Manual check of right arm in exam room by M.D.-128/70. At home patient states blood pressure currently 130s and 140s systolic. Uses a blood pressure machine at home-not manual. Patient has had no headaches. No dizziness. No blurred vision. Doing well with Cozaar and hydrochlorothiazide. No side effects of medications.  Weight management: Patient has lost 2 pounds since last appointment. States that she is working to control portion sizes.  Has decreased soda intake. Exercise is predominantly by doing yard work. Not walking or on a strict exercise routine.  Right knee pain: X4 days.  No known trauma to the knee. Pain located on lateral aspect. Pain worse after she has been sitting for a while and goes to stand up. Tried to split the knee and this made the pain worse, it felt more stiff. Pain is worse with movement. Improves if she rests the knee in a bent position with ice applied. Able to walk-pain seemed to improve after she walks around a bit. Has been taking Advil 400 mg or Aleve- 2 tabs- with minimal relief.  No joint redness. No joint swelling. No fever.    Smoking status reviewed.   Review of Systems    as per above. Objective:   Physical Exam  Constitutional: She appears well-developed and well-nourished.  Cardiovascular: Normal rate, regular rhythm and normal heart sounds.   No murmur heard. Pulmonary/Chest: Effort normal. No respiratory distress.  Musculoskeletal: She exhibits no edema.       Right knee exam: Normal rom or right knee and hip.  + tenderness on palpation of lateral aspect of tibial plateau.    Crepitus of knee bilateral with movement. Normal strength.  Ligaments wnl.  No redness. No effusion.   Negative Mcmurray's.      Neurological: She is alert.          Assessment & Plan:

## 2012-04-05 NOTE — Patient Instructions (Addendum)
Blood pressure: Continue meds as directed.  Continue to monitor at home.    Weight: Continue to work on portion control, cutting out sugary drinks, try to increase exercise.   Knee pain:  I think that your pain is due to arthritis or possibly an inflammed bursea.  Take mobic as directed.  Continue to move knee, walk.    Return in 3-4 months for blood pressure and weight recheck.   Return sooner if knee pain doesn't resolve after 1 week.  Or if new or worsening of symptoms.

## 2012-04-05 NOTE — Assessment & Plan Note (Signed)
Well controlled. Continue cozaar and hctz.  Encouraged patient to exercise and continue weight loss efforts.

## 2012-04-05 NOTE — Assessment & Plan Note (Signed)
Knee pain may be 2/2 to arthritis flare (+ crepitus bilateral suggesting h/o arthritis)- this may be pt's first flare.  Also, could be knee bursa inflammation.  Will treat with daily mobic x 7 days, then prn.  Pt to continue to walk and be active.  Return if no improvement in 1-2 weeks for recheck.

## 2013-02-21 ENCOUNTER — Other Ambulatory Visit: Payer: Self-pay | Admitting: Family Medicine

## 2013-03-13 ENCOUNTER — Ambulatory Visit (INDEPENDENT_AMBULATORY_CARE_PROVIDER_SITE_OTHER): Payer: 59 | Admitting: Family Medicine

## 2013-03-13 ENCOUNTER — Encounter: Payer: Self-pay | Admitting: Family Medicine

## 2013-03-13 VITALS — BP 117/63 | HR 69 | Ht 68.0 in | Wt 166.0 lb

## 2013-03-13 DIAGNOSIS — I10 Essential (primary) hypertension: Secondary | ICD-10-CM

## 2013-03-13 DIAGNOSIS — Z0000 Encounter for general adult medical examination without abnormal findings: Secondary | ICD-10-CM

## 2013-03-13 DIAGNOSIS — G479 Sleep disorder, unspecified: Secondary | ICD-10-CM

## 2013-03-13 DIAGNOSIS — L989 Disorder of the skin and subcutaneous tissue, unspecified: Secondary | ICD-10-CM

## 2013-03-13 LAB — BASIC METABOLIC PANEL
BUN: 23 mg/dL (ref 6–23)
CO2: 27 mEq/L (ref 19–32)
Calcium: 9.3 mg/dL (ref 8.4–10.5)
Chloride: 105 mEq/L (ref 96–112)
Creat: 0.89 mg/dL (ref 0.50–1.10)
Glucose, Bld: 145 mg/dL — ABNORMAL HIGH (ref 70–99)
Potassium: 3.8 mEq/L (ref 3.5–5.3)
Sodium: 140 mEq/L (ref 135–145)

## 2013-03-13 MED ORDER — HYDROCHLOROTHIAZIDE 25 MG PO TABS: 25.0000 mg | ORAL_TABLET | Freq: Every day | ORAL | Status: DC

## 2013-03-13 MED ORDER — LOSARTAN POTASSIUM 100 MG PO TABS: 100.0000 mg | ORAL_TABLET | Freq: Every day | ORAL | Status: DC

## 2013-03-13 MED ORDER — TRAZODONE HCL 50 MG PO TABS: 50.0000 mg | ORAL_TABLET | Freq: Every evening | ORAL | Status: DC | PRN

## 2013-03-13 NOTE — Patient Instructions (Addendum)
Follow up in 6 months  Make an appointment with the procedure clinic for a skin biopsy.

## 2013-03-14 ENCOUNTER — Encounter: Payer: Self-pay | Admitting: Family Medicine

## 2013-03-17 DIAGNOSIS — G479 Sleep disorder, unspecified: Secondary | ICD-10-CM | POA: Insufficient documentation

## 2013-03-17 DIAGNOSIS — L989 Disorder of the skin and subcutaneous tissue, unspecified: Secondary | ICD-10-CM | POA: Insufficient documentation

## 2013-03-17 DIAGNOSIS — Z0000 Encounter for general adult medical examination without abnormal findings: Secondary | ICD-10-CM | POA: Insufficient documentation

## 2013-03-17 NOTE — Assessment & Plan Note (Signed)
Trial of trazadone 

## 2013-03-17 NOTE — Progress Notes (Signed)
Patient ID: Debra Farrell    DOB: May 19, 1946, 67 y.o.   MRN: [id number] --- Subjective:  Debra Farrell is a 67 y.o.female who presents for routine general health visit and refills on medications.  - hypertension: on losartan 100 and hydrochlorothiazide 25.compliant. Tolerating medication well. No chest pain, no shortness of breath, no lower extremity edema, no headache.  - difficulty sleeping: goes to sleep around 9pm-10pm. Wakes up around 2am and then has trouble going back to sleep. This is relatively new occurrence. No new life stressors that she worries about. No caffeine during the afternoon. No TV before bed.  - skin lesions:  -One spot on right upper back that has been scabbing over and has been present for a few months.  -Lesion on left axilla that has been there since adolescence, has grown slowly through the years.   - health maintenance:  Has not yet had colonoscopy.  Mammogram up to date PAP: h.o hysterectomy   ROS: see HPI Past Medical History: reviewed and updated medications and allergies. Medications: takes multiple vitamins.  Social History: Tobacco: no  Objective: Filed Vitals:   03/13/13 1528  BP: 117/63  Pulse: 69    Physical Examination:   General appearance - alert, well appearing, and in no distress Nose - normal and patent, no erythema, discharge or polyps Mouth - mucous membranes moist, pharynx normal without lesions Neck - supple, no significant adenopathy Chest - clear to auscultation, no wheezes, rales or rhonchi, symmetric air entry Heart - normal rate, regular rhythm, normal S1, S2, no murmurs, rubs, clicks or gallops Extremities - peripheral pulses normal, no pedal edema, no clubbing or cyanosis Skin - 1x1cm erythematous, scabbed macule, tanned skin  Skin growth under left axilla

## 2013-03-17 NOTE — Assessment & Plan Note (Signed)
Macule on back: differential squamous cell ca vs basal cell vs actinic keratosis. Recommended follow up at procedure clinic for punch biopsy.   Growth under axilla: possible lipoma. Not growing rapidly and longstanding. Monitor.

## 2013-03-17 NOTE — Assessment & Plan Note (Signed)
Mammogram done Due for colonoscopy: encouraged her to get it.

## 2013-03-17 NOTE — Assessment & Plan Note (Signed)
Well controled on hctz and losartan. Obtain BMP to monitor cr and K. Otherwise, continue current therapy.

## 2013-03-21 ENCOUNTER — Other Ambulatory Visit: Payer: Self-pay | Admitting: Family Medicine

## 2013-03-21 ENCOUNTER — Encounter: Payer: Self-pay | Admitting: Family Medicine

## 2013-03-21 ENCOUNTER — Ambulatory Visit (INDEPENDENT_AMBULATORY_CARE_PROVIDER_SITE_OTHER): Payer: 59 | Admitting: Family Medicine

## 2013-03-21 VITALS — BP 131/70 | HR 70 | Ht 68.0 in | Wt 166.0 lb

## 2013-03-21 DIAGNOSIS — D1720 Benign lipomatous neoplasm of skin and subcutaneous tissue of unspecified limb: Secondary | ICD-10-CM

## 2013-03-21 DIAGNOSIS — L989 Disorder of the skin and subcutaneous tissue, unspecified: Secondary | ICD-10-CM

## 2013-03-21 DIAGNOSIS — D1739 Benign lipomatous neoplasm of skin and subcutaneous tissue of other sites: Secondary | ICD-10-CM

## 2013-03-21 NOTE — Patient Instructions (Addendum)
I will call you regarding your pathology which we should have by at lest Tuesday of next week. If you have not heard from [place] by then, please give my office a call.  The bump under your left arm looks like a lipoma (normal fatty deposit). Unless it became inflamed, bothersome or suddenly started to enlarge quickly, I would do nothing. If you decide to have it removed, it would be a simple procedure so just let me or your PCP know.  Treat the biopsy area just like a small cut--you can take the bandaid off in a few hours or leave it  On for a day or so. If the area gets wet, just dry it and if there is a bandaid, put a dry one on. You should not experience any significant redness, bleeding or pain. If you see any signs of infection or have problems, please cal lmy office or the on call doctor. (same number) Great to see you.

## 2013-03-21 NOTE — Progress Notes (Signed)
Patient ID: Debra Farrell, female   DOB: 1946/06/23, 67 y.o.   MRN: [id number] Patient here for evaluation of lesion on her back and lesion under her developed.  Lesion on her back has been there for a while. It does not itch, does not bleed. She thinks it may be a little bit larger than originally. The lump underneath the axilla has been there more than 20 years. It has gotten somewhat bigger with time but no recent growth. It is not painful, does not drain or itch.  OBJECTIVE: Well-developed female no acute distress SKIN: Right in the brawl I in the axilla is a soft slightly pedunculated fleshy downsized mass. Consistent with small lipomatous lesion. On the back there is an irregularly shaped flat hyperpigmented and slightly excoriated area that is doing half centimeters by 2 cm in its widest dimensions.  PROCEDURE NOTE: Patient given informed consent for punch biopsy. Area prepped and draped in years usual sterile fashion. 1 cc of 1% lidocaine with epinephrine using for local anesthesia. 4 mm punch biopsy taken. Hemostasis obtained with pressure. Patient chart procedure well. Essentially no blood loss(<1 cc)  ASSESSMENT: #1. Neoplasm of skin on the back uncertain behavior. 4 mm punch biopsy. #2. 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 This could be removed particularly if he became bothersome. I think it is totally benign. She will watch it.

## 2013-03-26 ENCOUNTER — Encounter: Payer: Self-pay | Admitting: Family Medicine

## 2014-02-27 ENCOUNTER — Other Ambulatory Visit: Payer: Self-pay | Admitting: Family Medicine

## 2014-04-26 DIAGNOSIS — IMO0002 Reserved for concepts with insufficient information to code with codable children: Secondary | ICD-10-CM

## 2014-04-26 HISTORY — DX: Reserved for concepts with insufficient information to code with codable children: IMO0002

## 2014-05-22 ENCOUNTER — Emergency Department (HOSPITAL_COMMUNITY)
Admission: EM | Admit: 2014-05-22 | Discharge: 2014-05-22 | Disposition: A | Discharge status: Home / Self Care | Payer: 59 | Attending: Emergency Medicine | Admitting: Emergency Medicine

## 2014-05-22 DIAGNOSIS — S01309A Unspecified open wound of unspecified ear, initial encounter: Secondary | ICD-10-CM | POA: Diagnosis present

## 2014-05-22 DIAGNOSIS — W1809XA Striking against other object with subsequent fall, initial encounter: Secondary | ICD-10-CM | POA: Diagnosis not present

## 2014-05-22 DIAGNOSIS — I10 Essential (primary) hypertension: Secondary | ICD-10-CM | POA: Insufficient documentation

## 2014-05-22 DIAGNOSIS — Z23 Encounter for immunization: Secondary | ICD-10-CM | POA: Diagnosis not present

## 2014-05-22 DIAGNOSIS — Z862 Personal history of diseases of the blood and blood-forming organs and certain disorders involving the immune mechanism: Secondary | ICD-10-CM | POA: Insufficient documentation

## 2014-05-22 DIAGNOSIS — Z79899 Other long term (current) drug therapy: Secondary | ICD-10-CM | POA: Insufficient documentation

## 2014-05-22 DIAGNOSIS — Z8639 Personal history of other endocrine, nutritional and metabolic disease: Secondary | ICD-10-CM | POA: Insufficient documentation

## 2014-05-22 DIAGNOSIS — Y9289 Other specified places as the place of occurrence of the external cause: Secondary | ICD-10-CM | POA: Insufficient documentation

## 2014-05-22 DIAGNOSIS — Y9389 Activity, other specified: Secondary | ICD-10-CM | POA: Insufficient documentation

## 2014-05-22 DIAGNOSIS — Z872 Personal history of diseases of the skin and subcutaneous tissue: Secondary | ICD-10-CM | POA: Insufficient documentation

## 2014-05-22 DIAGNOSIS — IMO0002 Reserved for concepts with insufficient information to code with codable children: Secondary | ICD-10-CM

## 2014-05-22 MED ORDER — TETANUS-DIPHTH-ACELL PERTUSSIS 5-2.5-18.5 LF-MCG/0.5 IM SUSP
0.5000 mL | Freq: Once | INTRAMUSCULAR | Status: AC
  Administered 2014-05-22: 0.5 mL via INTRAMUSCULAR
  Filled 2014-05-22: qty 0.5

## 2014-05-22 NOTE — Discharge Instructions (Signed)
Sutured Wound Care Ms. Kovich, you were seen today after a cut on your ear.  Your tetanus was updated and the laceration was repaired.  Follow up with your primary doctor in 7-10 days to have the wound evaluated and possibly get the stitches removed.  Return to the ED immediately if you develop worsening pain, swelling, redness, foul smelling drainage, or any concerns for infection.  Thank you. Sutures are stitches that can be used to close wounds. Caring for your wound can help stop infection and lessen pain. HOME CARE   Rest and raise (elevate) the injured area until the pain and puffiness (swelling) go away.  Only take medicines as told by your doctor.  Clean the wound gently with mild soap and water once a day after the first 2 days. Rinse off the soap. Pat the area dry with a clean towel. Do not rub the wound.  Change the bandage (dressing) as told by your doctor. If the bandage sticks, soak it off with soapy water. Stop using a bandage after 2 days or after the wound stops leaking fluid.  Put cream on the wound as told by your doctor.  Do not stretch the wound.  Drink enough fluids to keep your pee (urine) clear or pale yellow.  See your doctor to have the sutures removed.  Use sunscreen or sunblock on the wound after it heals. GET HELP RIGHT AWAY IF:   Your wound gets red, puffy, hot, or tender.  You have more pain in the wound.  You have a red streak that goes away from the wound.  You see yellowish-white fluid (pus) coming out of the wound.  You have a fever.  You have chills and start to shake.  You notice a bad smell coming from the wound.  Your wound will not stop bleeding. MAKE SURE YOU:   Understand these instructions.  Will watch your condition.  Will get help right away if you are not doing well or get worse. Document Released: 02/29/2008 Document Revised: 12/05/2011 Document Reviewed: 01/16/2011 [facility] Patient Information 2015 [employer], [place]. This  information is not intended to replace advice given to you by your health care provider. Make sure you discuss any questions you have with your health care provider. Laceration Care, Adult A laceration is a cut that goes through all layers of the skin. The cut goes into the tissue beneath the skin. HOME CARE For stitches (sutures) or staples:  Keep the cut clean and dry.  If you have a bandage (dressing), change it at least once a day. Change the bandage if it gets wet or dirty, or as told by your doctor.  Wash the cut with soap and water 2 times a day. Rinse the cut with water. Pat it dry with a clean towel.  Put a thin layer of medicated cream on the cut as told by your doctor.  You may shower after the first 24 hours. Do not soak the cut in water until the stitches are removed.  Only take medicines as told by your doctor.  Have your stitches or staples removed as told by your doctor. For skin adhesive strips:  Keep the cut clean and dry.  Do not get the strips wet. You may take a bath, but be careful to keep the cut dry.  If the cut gets wet, pat it dry with a clean towel.  The strips will fall off on their own. Do not remove the strips that are still stuck to  the cut. For wound glue:  You may shower or take baths. Do not soak or scrub the cut. Do not swim. Avoid heavy sweating until the glue falls off on its own. After a shower or bath, pat the cut dry with a clean towel.  Do not put medicine on your cut until the glue falls off.  If you have a bandage, do not put tape over the glue.  Avoid lots of sunlight or tanning lamps until the glue falls off. Put sunscreen on the cut for the first year to reduce your scar.  The glue will fall off on its own. Do not pick at the glue. You may need a tetanus shot if:  You cannot remember when you had your last tetanus shot.  You have never had a tetanus shot. If you need a tetanus shot and you choose not to have one, you may get  tetanus. Sickness from tetanus can be serious. GET HELP RIGHT AWAY IF:   Your pain does not get better with medicine.  Your arm, hand, leg, or foot loses feeling (numbness) or changes color.  Your cut is bleeding.  Your joint feels weak, or you cannot use your joint.  You have painful lumps on your body.  Your cut is red, puffy (swollen), or painful.  You have a red line on the skin near the cut.  You have yellowish-white fluid (pus) coming from the cut.  You have a fever.  You have a bad smell coming from the cut or bandage.  Your cut breaks open before or after stitches are removed.  You notice something coming out of the cut, such as wood or glass.  You cannot move a finger or toe. MAKE SURE YOU:   Understand these instructions.  Will watch your condition.  Will get help right away if you are not doing well or get worse. Document Released: 02/29/2008 Document Revised: 12/05/2011 Document Reviewed: 03/08/2011 [facility] Patient Information 2015 [place], [place]. This information is not intended to replace advice given to you by your health care provider. Make sure you discuss any questions you have with your health care provider.

## 2014-05-22 NOTE — ED Provider Notes (Signed)
CSN: [id number]     Arrival date & time 05/22/14  0520 History   First MD Initiated Contact with Patient 05/22/14 [phone]     No chief complaint on file.    (Consider location/radiation/quality/duration/timing/severity/associated sxs/prior Treatment) HPI Debra Farrell is a 68 year old female with no significant past medical history coming to emergency department with a superficial laceration. 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 She denies any LOC at that time or injury to other parts of her body.  She denies any headache chest pain or shortness of breath.  Patient is requesting a laceration repair so she can go to work at [employer].   Past Medical History  Diagnosis Date  . Hypertension   . Hyperlipidemia   . Eczema   . Thalassemia minor    Past Surgical History  Procedure Laterality Date  . Abdominal hysterectomy  1990    partial  . Incontinence surgery  1990, [id number]  . Rectocele repair    . Urocele      correction surgery   Family History  Problem Relation Age of Onset  . Cancer Mother     lung  . Asthma Father    History  Substance Use Topics  . Smoking status: Never Smoker   . Smokeless tobacco: Not on file  . Alcohol Use: No   OB History   Grav Para Term Preterm Abortions TAB SAB Ect Mult Living                 Review of Systems 10 Systems reviewed and are negative for acute change except as noted in the HPI.    Allergies  Morphine and related  Home Medications   Prior to Admission medications   Medication Sig Start Date End Date Taking? Authorizing Provider  hydrochlorothiazide (HYDRODIURIL) 25 MG tablet Take 1 tablet (25 mg total) by mouth daily. 03/13/13  Yes Kandis Nab, MD  losartan (COZAAR) 100 MG tablet Take 1 tablet (100 mg total) by mouth daily. 03/13/13  Yes Kandis Nab, MD  naproxen sodium (ANAPROX) 220 MG tablet Take 220 mg by mouth 2 (two) times daily as needed (for  pain).   Yes Historical Provider, MD   BP 161/72  Pulse 66  Temp(Src) 98.2 F (36.8 C) (Oral)  Resp 18  Ht 5\' 8"  (1.727 m)  Wt 173 lb (78.472 kg)  BMI 26.31 kg/m2  SpO2 99% Physical Exam  Nursing note and vitals reviewed. Constitutional: She is oriented to person, place, and time. She appears well-developed and well-nourished. No distress.  HENT:  Head: Normocephalic.  Nose: Nose normal.  Mouth/Throat: Oropharynx is clear and moist. No oropharyngeal exudate.  Right ear has a superficial laceration 3 x 3 x 1 cm with the dog flap.  There is no active hemorrhage. Laceration extends to the lateral face.  Eyes: Conjunctivae and EOM are normal. Pupils are equal, round, and reactive to light. No scleral icterus.  Neck: Normal range of motion. Neck supple. No JVD present. No tracheal deviation present. No thyromegaly present.  Cardiovascular: Normal rate, regular rhythm and normal heart sounds.  Exam reveals no gallop and no friction rub.   No murmur heard. Pulmonary/Chest: Effort normal and breath sounds normal. No respiratory distress. She has no wheezes. She exhibits no tenderness.  Abdominal: Soft. Bowel sounds are normal. She exhibits no distension and no mass. There is no tenderness. There is no rebound and no  guarding.  Musculoskeletal: Normal range of motion. She exhibits no edema and no tenderness.  Lymphadenopathy:    She has no cervical adenopathy.  Neurological: She is alert and oriented to person, place, and time.  Skin: Skin is warm and dry. No rash noted. She is not diaphoretic. No erythema. No pallor.    ED Course  Procedures (including critical care time) Labs Review Labs Reviewed - No data to display  Imaging Review No results found.   EKG Interpretation None      MDM   Final diagnoses:  Laceration   Patient is to emergency department seeking care for her laceration. Patient denies any loss of consciousness. CT scan of her head at this time is not  warranted. She has no neurological findings on exam or complaints. Patient tetanus was updated as it is greater than 56 years old. Patient had an elevated blood pressure. This appears to be her baseline she is asymptomatic. Patient safe for discharge.    LACERATION REPAIR Performed by: Everlene Balls Authorized byEverlene Balls Consent: Verbal consent obtained. Risks and benefits: risks, benefits and alternatives were discussed Consent given by: patient Patient identity confirmed: provided demographic data Prepped and Draped in normal sterile fashion Wound explored  Laceration Location: right ear  Laceration Length: 3x3x1cm  No Foreign Bodies seen or palpated  Anesthesia: local infiltration  Local anesthetic: lidocaine 2% with epinephrine  Anesthetic total: 5 ml  Irrigation method: syringe Amount of cleaning: standard  Skin closure: superficial  Number of sutures: 11  Technique: simple interrupted  Patient tolerance: Patient tolerated the procedure well with no immediate complications.     Everlene Balls, MD 05/22/14 [phone]

## 2014-05-22 NOTE — ED Notes (Addendum)
Pt fell OOB and fell against the corner of night stand after waking up fm bad dream. Pt cleaned wound up with peroxide and placed steri strips and iced  For 20 min PTA.   Pt is in hurry to get to wotrk at 0700

## 2014-05-30 ENCOUNTER — Ambulatory Visit (INDEPENDENT_AMBULATORY_CARE_PROVIDER_SITE_OTHER): Payer: 59 | Admitting: *Deleted

## 2014-05-30 DIAGNOSIS — Z4802 Encounter for removal of sutures: Secondary | ICD-10-CM

## 2014-05-30 NOTE — Progress Notes (Signed)
   Patient presents for suture removal of right ear laceration.  Wound assessed; no redness, drainage or swelling noted.  Pt stated area is tender; due her sleeping on that side.  11 sutures was placed; 8 sutures removed and 3 sutures left area not completely healed.  Precepted with Dr. Gwendlyn Deutscher; assessed area and agree with plan; pt to return on Tuesday 06/03/2014 for reminder of sutures to be removed.

## 2014-06-03 ENCOUNTER — Ambulatory Visit (INDEPENDENT_AMBULATORY_CARE_PROVIDER_SITE_OTHER): Payer: 59 | Admitting: *Deleted

## 2014-06-03 ENCOUNTER — Other Ambulatory Visit: Payer: Self-pay | Admitting: *Deleted

## 2014-06-03 DIAGNOSIS — Z4802 Encounter for removal of sutures: Secondary | ICD-10-CM

## 2014-06-03 MED ORDER — LOSARTAN POTASSIUM 100 MG PO TABS: 100.0000 mg | ORAL_TABLET | Freq: Every day | ORAL | Status: DC

## 2014-06-03 NOTE — Progress Notes (Signed)
   Pt in nurse clinic for reminder of sutures to be removed.  Three sutures in place right ear; three sutures removed without difficultly.  Pt advised to follow with PCP and return to clinic at signs of infections.  Derl Barrow, RN

## 2014-06-03 NOTE — Telephone Encounter (Signed)
Please let the pt know I sent a Rx for 1 month; I would like for her to come into clinic for a routine appt as she has not been seen in over a year.  Thanks, [employer]

## 2014-06-04 NOTE — Telephone Encounter (Signed)
Pt informed of rx being sent for 1 month and will make an routine appt soon. Elna Radovich CMA

## 2014-06-25 ENCOUNTER — Ambulatory Visit (INDEPENDENT_AMBULATORY_CARE_PROVIDER_SITE_OTHER): Payer: 59 | Admitting: Family Medicine

## 2014-06-25 ENCOUNTER — Encounter: Payer: Self-pay | Admitting: Family Medicine

## 2014-06-25 VITALS — BP 156/66 | HR 76 | Temp 98.2°F | Wt 168.0 lb

## 2014-06-25 DIAGNOSIS — Z0000 Encounter for general adult medical examination without abnormal findings: Secondary | ICD-10-CM

## 2014-06-25 DIAGNOSIS — G479 Sleep disorder, unspecified: Secondary | ICD-10-CM

## 2014-06-25 DIAGNOSIS — E785 Hyperlipidemia, unspecified: Secondary | ICD-10-CM

## 2014-06-25 DIAGNOSIS — I10 Essential (primary) hypertension: Secondary | ICD-10-CM

## 2014-06-25 DIAGNOSIS — Z23 Encounter for immunization: Secondary | ICD-10-CM

## 2014-06-25 MED ORDER — LOSARTAN POTASSIUM 100 MG PO TABS: 100.0000 mg | ORAL_TABLET | Freq: Every day | ORAL | Status: DC

## 2014-06-25 NOTE — Patient Instructions (Signed)
It was nice meeting you. For sleep, you can try OTC melatonin. If this is not helpful, please call the office and we can consider an alternative. Please take the time to get your mammogram and colonoscopy. Follow up with me in 1 year or sooner if needed.

## 2014-06-25 NOTE — Progress Notes (Signed)
Patient ID: Debra Farrell, female   DOB: 06/02/1946, 68 y.o.   MRN: [id number] [facility]  Debra Puff, MD  Subjective:  Chief complaint: medication refill  Hypertension  Currently on losartan and HCTZ. Patient feels her BPs have been stable when she takes them at work (she's a Engineer, civil (consulting) at [employer]). No chest pain, SOB, change in vision, or symptoms of orthostatic hypotension.   Insomnia: Patient notes that she works 12 hour day shifts. She normally goes to bed around 9pm and wakes up around 2am. She denies any depressive symptoms such as change in diet, fatigue, or loss of interest in activities. She feels she has good sleep hygiene.   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 No constipation, diarrhea, nausea, or vomiting.   Past Medical History Patient Active Problem List   Diagnosis Date Noted  . Skin lesion of back 03/21/2013  . Lipoma of axilla 03/21/2013  . Sleep disorder 03/17/2013  . Skin lesion 03/17/2013  . Health care maintenance 03/17/2013  . Right knee pain 04/05/2012  . Health maintenance examination 10/17/2011  . 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 Hypertension 10/17/2011  . Hyperlipidemia 10/17/2011  . Overweight 10/17/2011  . Headache 10/17/2011    Medications- reviewed and updated Current Outpatient Prescriptions  Medication Sig Dispense Refill  . hydrochlorothiazide (HYDRODIURIL) 25 MG tablet Take 1 tablet (25 mg total) by mouth daily.  90 tablet  3  . losartan (COZAAR) 100 MG tablet Take 1 tablet (100 mg total) by mouth daily.  90 tablet  4  . naproxen sodium (ANAPROX) 220 MG tablet Take 220 mg by mouth 2 (two) times daily as needed (for pain).       No current facility-administered medications for this visit.    Objective: BP 156/66  Pulse 76  Temp(Src) 98.2 F (36.8 C) (Oral)  Wt 168 lb (76.204 kg) Repeat manual BP by MD: 126/70 Gen: Pleasant. No acute distress. Alert, cooperative with exam HEENT:  Atraumatic, EOMI, PERRLA, Oropharynx clear. MMM CV: RRR. No murmurs, rubs, or gallops noted. 2+ radial and DP pulses bilaterally. Resp: CTAB. No wheezing, crackles, or rhonchi noted. Abd: +BS. Soft, non-distended, non-tender. No rebound or guarding.  Ext: No edema. No gross deformities. Neuro: Alert and oriented, No gross focal deficits   Assessment/Plan:  Hypertension BP stable and at goal. No medication side effects. - Continue losartan and HCTZ - CMET today  Health maintenance examination Patient s/p hysterectomy that was preformed in [place]. She is pretty confident that her cervix was removed at this time therefore no need for pap smear. Patient preforms regular breast exams on herself. Defers breast and pelvic exam by MD today. - Influenza vaccine given today - PCV 13 given today, will need PCV 23 in 1 year. - Patient due to mammogram- last was done in 2013. She had the information. - Recommended DEXA scan when she goes to get her mammogram - Patient due for colonoscopy- has the information. No hematochezia/melana. -Tdap and Zostavax up to date. -   Sleep disorder Patient never tried trazodone prescribed by Dr. Gwenlyn Saran. - Will attempt a trial of melatonin      Orders Placed This Encounter  Procedures  . Pneumococcal conjugate vaccine 13-valent IM  . Comprehensive metabolic panel    Meds ordered this encounter  Medications  . losartan (COZAAR) 100 MG tablet    Sig: Take 1 tablet (100 mg total) by mouth daily.    Dispense:  90 tablet  Refill:  4

## 2014-06-25 NOTE — Assessment & Plan Note (Signed)
Patient never tried trazodone prescribed by Dr. Otis Dials. - Will attempt a trial of melatonin

## 2014-06-25 NOTE — Assessment & Plan Note (Signed)
Patient s/p hysterectomy that was preformed in [place]. She is pretty confident that her cervix was removed at this time therefore no need for pap smear. Patient preforms regular breast exams on herself. Defers breast and pelvic exam by MD today. - Influenza vaccine given today - PCV 13 given today, will need PCV 23 in 1 year. - Patient due to mammogram- last was done in 2013. She had the information. - Recommended DEXA scan when she goes to get her mammogram - Patient due for colonoscopy- has the information. No hematochezia/melana. -Tdap and Zostavax up to date. -

## 2014-06-25 NOTE — Assessment & Plan Note (Signed)
BP stable and at goal. No medication side effects. - Continue losartan and HCTZ - CMET today

## 2014-06-26 LAB — COMPREHENSIVE METABOLIC PANEL
ALT: 19 U/L (ref 0–35)
AST: 22 U/L (ref 0–37)
Albumin: 4.3 g/dL (ref 3.5–5.2)
Alkaline Phosphatase: 94 U/L (ref 39–117)
BUN: 21 mg/dL (ref 6–23)
CO2: 29 mEq/L (ref 19–32)
Calcium: 9.5 mg/dL (ref 8.4–10.5)
Chloride: 101 mEq/L (ref 96–112)
Creat: 0.95 mg/dL (ref 0.50–1.10)
Glucose, Bld: 75 mg/dL (ref 70–99)
Potassium: 3.7 mEq/L (ref 3.5–5.3)
Sodium: 137 mEq/L (ref 135–145)
Total Bilirubin: 0.4 mg/dL (ref 0.2–1.2)
Total Protein: 6.8 g/dL (ref 6.0–8.3)

## 2015-04-07 ENCOUNTER — Ambulatory Visit (INDEPENDENT_AMBULATORY_CARE_PROVIDER_SITE_OTHER): Payer: 59 | Admitting: Family Medicine

## 2015-04-07 ENCOUNTER — Encounter: Payer: Self-pay | Admitting: Family Medicine

## 2015-04-07 VITALS — BP 121/55 | HR 72 | Temp 98.0°F | Ht 68.0 in | Wt 161.9 lb

## 2015-04-07 DIAGNOSIS — I10 Essential (primary) hypertension: Secondary | ICD-10-CM | POA: Diagnosis not present

## 2015-04-07 DIAGNOSIS — E785 Hyperlipidemia, unspecified: Secondary | ICD-10-CM | POA: Diagnosis not present

## 2015-04-07 MED ORDER — HYDROCHLOROTHIAZIDE 25 MG PO TABS: 25.0000 mg | ORAL_TABLET | Freq: Every day | ORAL | Status: DC

## 2015-04-07 NOTE — Patient Instructions (Signed)
It was good to see you. Please schedule a mammogram and colonoscopy  Continue taking HCTZ '25mg'$  daily and monitor your BPs, if elevated call the clinic Follow up in [place] for your physical exam

## 2015-04-07 NOTE — Progress Notes (Signed)
Patient ID: Debra Farrell, female   DOB: 1946/08/31, 69 y.o.   MRN: [id number]    Subjective: CC:f/u HTN HPI: Patient is a 69 y.o. female with a past medical history of HTN presenting to clinic today for f/u HTN.  Hypertension Blood pressure at home: stable: 130s/ 80s Blood pressure today: 121/55 Meds: Compliant with losartan and HCTZ x 2wks, however has not been taking them previously since 05/2014 Side effects: Fatigue ROS: Denies headache, dizziness, visual changes, nausea, vomiting, chest pain, abdominal pain or shortness of breath.   Social History: Patient currently the guardian of her granddaughter who's 40 y/o. Having difficult time. In therapy 2x/wk for her behavior. Was hoping to take care of her grandbaby in [place] this summer but that fell through. RN at [employer].  ROS: All other systems reviewed and are negative.  Past Medical History Patient Active Problem List   Diagnosis Date Noted  . Skin lesion of back 03/21/2013  . Lipoma of axilla 03/21/2013  . Sleep disorder 03/17/2013  . Skin lesion 03/17/2013  . Health care maintenance 03/17/2013  . Right knee pain 04/05/2012  . Health maintenance examination 10/17/2011  . 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 Hypertension 10/17/2011  . Hyperlipidemia 10/17/2011  . Overweight(278.02) 10/17/2011  . Headache(784.0) 10/17/2011    Medications- reviewed and updated Current Outpatient Prescriptions  Medication Sig Dispense Refill  . hydrochlorothiazide (HYDRODIURIL) 25 MG tablet Take 1 tablet (25 mg total) by mouth daily. 90 tablet 3   No current facility-administered medications for this visit.    Objective: Office vital signs reviewed. BP 121/55 mmHg  Pulse 72  Temp(Src) 98 F (36.7 C) (Oral)  Ht '5\' 8"'$  (1.727 m)  Wt 161 lb 14.4 oz (73.437 kg)  BMI 24.62 kg/m2   Physical Examination:  General: Awake, alert, well- nourished, NAD Cardio: RRR, no m/r/g noted. No pitting edema noted.  Pulm: No increased WOB.  CTAB,  without wheezes, rhonchi or crackles noted.  MSK: Normal gait and station  Assessment/Plan: Hypertension BP stable/slightly low. Patient feels she's had decreased energy lately. Endorses that she's only been taking medication x 2 wks in preparation for OV.  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     Orders Placed This Encounter  Procedures  . Basic Metabolic Panel    Meds ordered this encounter  Medications  . hydrochlorothiazide (HYDRODIURIL) 25 MG tablet    Sig: Take 1 tablet (25 mg total) by mouth daily.    Dispense:  90 tablet    Refill:  [place] PGY-2, [place]

## 2015-04-07 NOTE — Assessment & Plan Note (Signed)
BP stable/slightly low. Patient feels she's had decreased energy lately. Endorses that she's only been taking medication x 2 wks in preparation for OV.  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

## 2015-04-08 LAB — BASIC METABOLIC PANEL
BUN: 33 mg/dL — ABNORMAL HIGH (ref 6–23)
CALCIUM: 9.8 mg/dL (ref 8.4–10.5)
CHLORIDE: 100 meq/L (ref 96–112)
CO2: 29 meq/L (ref 19–32)
CREATININE: 1.04 mg/dL (ref 0.50–1.10)
GLUCOSE: 114 mg/dL — AB (ref 70–99)
Potassium: 3.6 mEq/L (ref 3.5–5.3)
SODIUM: 141 meq/L (ref 135–145)

## 2015-04-14 ENCOUNTER — Encounter: Payer: Self-pay | Admitting: Family Medicine

## 2015-09-11 ENCOUNTER — Ambulatory Visit (INDEPENDENT_AMBULATORY_CARE_PROVIDER_SITE_OTHER): Payer: 59 | Admitting: Family Medicine

## 2015-09-11 ENCOUNTER — Encounter: Payer: Self-pay | Admitting: Family Medicine

## 2015-09-11 VITALS — BP 125/60 | HR 86 | Temp 98.5°F | Ht 66.0 in | Wt 166.9 lb

## 2015-09-11 DIAGNOSIS — R590 Localized enlarged lymph nodes: Secondary | ICD-10-CM | POA: Diagnosis not present

## 2015-09-11 DIAGNOSIS — R221 Localized swelling, mass and lump, neck: Secondary | ICD-10-CM | POA: Diagnosis not present

## 2015-09-11 DIAGNOSIS — Z1159 Encounter for screening for other viral diseases: Secondary | ICD-10-CM

## 2015-09-11 LAB — CBC WITH DIFFERENTIAL/PLATELET
BASOS ABS: 0.1 10*3/uL (ref 0.0–0.1)
Basophils Relative: 1 % (ref 0–1)
EOS PCT: 3 % (ref 0–5)
Eosinophils Absolute: 0.2 10*3/uL (ref 0.0–0.7)
HEMATOCRIT: 35.9 % — AB (ref 36.0–46.0)
HEMOGLOBIN: 11.6 g/dL — AB (ref 12.0–15.0)
LYMPHS ABS: 1.4 10*3/uL (ref 0.7–4.0)
LYMPHS PCT: 19 % (ref 12–46)
MCH: 19.4 pg — ABNORMAL LOW (ref 26.0–34.0)
MCHC: 32.3 g/dL (ref 30.0–36.0)
MCV: 60 fL — AB (ref 78.0–100.0)
Monocytes Absolute: 0.7 10*3/uL (ref 0.1–1.0)
Monocytes Relative: 10 % (ref 3–12)
Neutro Abs: 4.8 10*3/uL (ref 1.7–7.7)
Neutrophils Relative %: 67 % (ref 43–77)
Platelets: 307 10*3/uL (ref 150–400)
RBC: 5.98 MIL/uL — ABNORMAL HIGH (ref 3.87–5.11)
RDW: 15.8 % — ABNORMAL HIGH (ref 11.5–15.5)
WBC: 7.2 10*3/uL (ref 4.0–10.5)

## 2015-09-11 NOTE — Patient Instructions (Signed)
You will be referred to the ENT (Ear Nose and Throat) doctors. They will evaluate you and most likely will plan for a biopsy of the lump/lymph node; they may or may not order imaging studies before this.

## 2015-09-11 NOTE — Progress Notes (Signed)
Subjective:    Patient ID: Debra Farrell, female    DOB: [birth year], 69 y.o.   MRN: [id number]  HPI  Patient presents for Same Day Appointment  CC: lump on neck  # Right neck mass:  First noticed about 6 months ago, she ignored it for a while  Initially soft, more movable. Was not painful.  In last several months has noticed the size getting progressively bigger, less mobile, more tender (but not sure if this is because she is playing with it more)  Has not noticed any other lumps/masses  Works at [employer] and was being bothered by coworkers and doctors to get it looked at  She denies anything out of the ordinary when she first noticed it -- no infections, no new symptoms (fatigue, fevers), no travel  She continues to deny any other symptoms: no difficulty swallowing, no fevers/chills, no SOB, no cough, no night sweats, no weight changes  Remote history of a left breast lump that was taken out and told it was benign (1985)  Social Hx: never smoker. Not currently sexually active (reports last time >10 years ago with ex-husband)  Review of Systems   See HPI for ROS.   Past medical history, surgical, family, and social history reviewed and updated in the EMR as appropriate.  Objective:  BP 125/60 mmHg  Pulse 86  Temp(Src) 98.5 F (36.9 C) (Oral)  Ht 5\' 6"  (1.676 m)  Wt 166 lb 14.4 oz (75.705 kg)  BMI 26.95 kg/m2 Vitals and nursing note reviewed  General: NAD Neck: there is an obvious mass anterior right cervical neck near SCM area approx 6cm. It is not fluctuant, it is firm and immobile.  CV: RRR, normal heart sounds, no murmurs Resp: clear to auscultation bilaterally, normal effort Skin: no surrounding skin lesions or erythema   Assessment & Plan:   1. Mass of right side of neck Enlarging isolated mass/node in a 69 yo female. No other symptoms that would point to a source, low suspicion of infectious etiology. Concerning for possible malignancy (this was  discussed with her) vs benign tumor. Check CBC with diff, HIV (discussed low possibility but should be ruled out). Will refer to ENT for further evaluation/workup (called and made appointment in clinic, first available is Jan 3rd). - Ambulatory referral to ENT - CBC with Differential/Platelet - HIV antibody  Will also check Hep C screen since we are doing blood work.

## 2015-09-12 ENCOUNTER — Telehealth: Payer: Self-pay | Admitting: Family Medicine

## 2015-09-12 LAB — HIV ANTIBODY (ROUTINE TESTING W REFLEX): HIV 1&2 Ab, 4th Generation: NONREACTIVE

## 2015-09-12 LAB — HEPATITIS C ANTIBODY: HCV Ab: NEGATIVE

## 2015-09-12 NOTE — Telephone Encounter (Signed)
Called patient and informed of negative testing results, neg HIV and Hep C, normal WBC, hgb slightly low (pt reports thalassemia minor). No changes to be made based on this, still recommending ENT follow up which was scheduled yesterday. -Dr. Lamar Benes

## 2015-09-29 ENCOUNTER — Other Ambulatory Visit: Payer: Self-pay | Admitting: Otolaryngology

## 2015-09-29 ENCOUNTER — Other Ambulatory Visit (HOSPITAL_COMMUNITY)
Admission: RE | Admit: 2015-09-29 | Discharge: 2015-09-29 | Disposition: A | Discharge status: Home / Self Care | Payer: 59 | Source: Ambulatory Visit | Attending: Otolaryngology | Admitting: Otolaryngology

## 2015-09-29 DIAGNOSIS — R221 Localized swelling, mass and lump, neck: Secondary | ICD-10-CM | POA: Diagnosis not present

## 2015-09-29 DIAGNOSIS — I96 Gangrene, not elsewhere classified: Secondary | ICD-10-CM | POA: Diagnosis not present

## 2015-09-29 DIAGNOSIS — K1370 Unspecified lesions of oral mucosa: Secondary | ICD-10-CM | POA: Diagnosis not present

## 2015-09-29 DIAGNOSIS — K134 Granuloma and granuloma-like lesions of oral mucosa: Secondary | ICD-10-CM | POA: Diagnosis not present

## 2015-09-29 DIAGNOSIS — R590 Localized enlarged lymph nodes: Secondary | ICD-10-CM | POA: Diagnosis not present

## 2015-10-01 ENCOUNTER — Other Ambulatory Visit (HOSPITAL_COMMUNITY): Payer: Self-pay | Admitting: Otolaryngology

## 2015-10-01 DIAGNOSIS — R590 Localized enlarged lymph nodes: Secondary | ICD-10-CM

## 2015-10-06 ENCOUNTER — Other Ambulatory Visit (HOSPITAL_COMMUNITY): Payer: Self-pay | Admitting: Otolaryngology

## 2015-10-06 DIAGNOSIS — C799 Secondary malignant neoplasm of unspecified site: Secondary | ICD-10-CM

## 2015-10-08 ENCOUNTER — Encounter (HOSPITAL_COMMUNITY): Payer: Self-pay

## 2015-10-08 ENCOUNTER — Ambulatory Visit (HOSPITAL_COMMUNITY)
Admission: RE | Admit: 2015-10-08 | Discharge: 2015-10-08 | Disposition: A | Discharge status: Home / Self Care | Payer: 59 | Source: Ambulatory Visit | Attending: Otolaryngology | Admitting: Otolaryngology

## 2015-10-08 DIAGNOSIS — R590 Localized enlarged lymph nodes: Secondary | ICD-10-CM | POA: Diagnosis not present

## 2015-10-08 DIAGNOSIS — K148 Other diseases of tongue: Secondary | ICD-10-CM | POA: Diagnosis not present

## 2015-10-08 LAB — CREATININE, SERUM: CREATININE: 0.82 mg/dL (ref 0.44–1.00)

## 2015-10-08 MED ORDER — IOHEXOL 300 MG/ML  SOLN
75.0000 mL | Freq: Once | INTRAMUSCULAR | Status: AC | PRN
  Administered 2015-10-08: 75 mL via INTRAVENOUS

## 2015-10-12 ENCOUNTER — Telehealth: Payer: Self-pay | Admitting: *Deleted

## 2015-10-12 ENCOUNTER — Encounter (HOSPITAL_COMMUNITY): Payer: 59

## 2015-10-12 ENCOUNTER — Telehealth: Payer: Self-pay | Admitting: Hematology and Oncology

## 2015-10-12 ENCOUNTER — Other Ambulatory Visit: Payer: Self-pay | Admitting: *Deleted

## 2015-10-12 NOTE — Telephone Encounter (Signed)
  Oncology Nurse Navigator Documentation Navigator Location: [facility] (10/12/15 1622) Navigator Encounter Type: Telephone (10/12/15 1622) Telephone: Appt Confirmation/Clarification (10/12/15 1622)             Barriers/Navigation Needs: Coordination of Care (10/12/15 1622)   Interventions: Coordination of Care (10/12/15 1622)       Spoke with Ms. Greeson:  Informed her of upcoming appts:  1/18 7:30 PET with 0700 arrival at [facility] Radiology, NPO after midnight; 1/20 12:30 NE and 1:00 Dr. Isidore Moos; 1/23 10:30 or 11:30 Dr. Alvy Bimler.   Explained the purpose of a dental evaluation prior to starting RT, indicated she will be contacted by Dental Medicine to schedule an appt with Dr. Enrique Sack. She verbalized understanding of information provided, understands I will contact her to verify appt time with Dr. Alvy Bimler.  Gayleen Orem, RN, BSN, [place] at [place] [phone]                Time Spent with Patient: 15 (10/12/15 1622)

## 2015-10-12 NOTE — Telephone Encounter (Signed)
  Oncology Nurse Navigator Documentation Navigator Location: [facility] (10/12/15 0935) Navigator Encounter Type: Introductory phone call (10/12/15 0935)     Confirmed Diagnosis Date: 09/29/15 (10/12/15 0935)     Placed introductory call to new referral patient.  She was enroute home from [place].  Introduced myself as the oncology nurse navigator that works with Drs Alvy Bimler and Isidore Moos to whom she is being referred by Dr. Erik Obey, [facility] ENT.  She confirmed her understanding of pending referrals, appt for PET this Friday 12:00.  She understands I am arranging additional appts with Drs. Alvy Bimler and Isidore Moos and will be following up with her.    I provided my contact information, told her I will call again tomorrow.  Gayleen Orem, RN, BSN, [place] at [place] [phone] .                           Time Spent with Patient: 15 (10/12/15 0935)

## 2015-10-12 NOTE — Telephone Encounter (Signed)
Rick sent and urgent pof for a new patient.it was sen to HIM/kim H as an inbasket a nd she is aware as well   anne

## 2015-10-13 ENCOUNTER — Telehealth: Payer: Self-pay | Admitting: *Deleted

## 2015-10-13 NOTE — Telephone Encounter (Signed)
  Oncology Nurse Navigator Documentation Navigator Location: [facility] (10/13/15 1312) Navigator Encounter Type: Telephone (10/13/15 1312) Telephone: Appt Confirmation/Clarification (10/13/15 1312)      Spoke with Debra Farrell, she acknowledged understanding of appts through next Monday, including most recently scheduled 1/23 11:30 Dr Alvy Bimler and 12:45 Dental Medicine.   She requested I email this information to her home which I later did.   She understands she can contact me with questions.  Gayleen Orem, RN, BSN, [place] at [place] [phone]                                        Time Spent with Patient: 15 (10/13/15 1312)

## 2015-10-14 ENCOUNTER — Telehealth: Payer: Self-pay | Admitting: *Deleted

## 2015-10-14 ENCOUNTER — Encounter: Payer: Self-pay | Admitting: Hematology and Oncology

## 2015-10-14 ENCOUNTER — Encounter (HOSPITAL_COMMUNITY)
Admission: RE | Admit: 2015-10-14 | Discharge: 2015-10-14 | Disposition: A | Discharge status: Home / Self Care | Payer: 59 | Source: Ambulatory Visit | Attending: Otolaryngology | Admitting: Otolaryngology

## 2015-10-14 ENCOUNTER — Telehealth: Payer: Self-pay | Admitting: Hematology and Oncology

## 2015-10-14 ENCOUNTER — Other Ambulatory Visit (HOSPITAL_COMMUNITY): Payer: Self-pay | Admitting: Otolaryngology

## 2015-10-14 DIAGNOSIS — C01 Malignant neoplasm of base of tongue: Secondary | ICD-10-CM | POA: Insufficient documentation

## 2015-10-14 DIAGNOSIS — C799 Secondary malignant neoplasm of unspecified site: Secondary | ICD-10-CM

## 2015-10-14 DIAGNOSIS — C4442 Squamous cell carcinoma of skin of scalp and neck: Secondary | ICD-10-CM | POA: Diagnosis not present

## 2015-10-14 HISTORY — DX: Malignant neoplasm of base of tongue: C01

## 2015-10-14 LAB — GLUCOSE, CAPILLARY: Glucose-Capillary: 113 mg/dL — ABNORMAL HIGH (ref 65–99)

## 2015-10-14 MED ORDER — FLUDEOXYGLUCOSE F - 18 (FDG) INJECTION
8.3000 | Freq: Once | INTRAVENOUS | Status: AC | PRN
  Administered 2015-10-14: 8.3 via INTRAVENOUS

## 2015-10-14 NOTE — Telephone Encounter (Signed)
  Oncology Nurse Navigator Documentation Navigator Location: [facility] (10/14/15 1415) Navigator Encounter Type: Telephone (10/14/15 1415) Telephone: Appt Confirmation/Clarification (10/14/15 1415)     Called patient, confirmed her understanding that appt with Dr. Alvy Bimler has been rescheduled for this Friday at 11:30.  She verbalized understanding.  Gayleen Orem, RN, BSN, [place] at [place] [phone]                                       Time Spent with Patient: 15 (10/14/15 1415)

## 2015-10-14 NOTE — Telephone Encounter (Signed)
Pt aware of appt 10/16/15'@11'$ :00

## 2015-10-15 ENCOUNTER — Telehealth: Payer: Self-pay | Admitting: *Deleted

## 2015-10-15 ENCOUNTER — Other Ambulatory Visit (HOSPITAL_COMMUNITY): Payer: Self-pay | Admitting: Otolaryngology

## 2015-10-15 DIAGNOSIS — E0789 Other specified disorders of thyroid: Secondary | ICD-10-CM

## 2015-10-15 NOTE — Telephone Encounter (Signed)
  Oncology Nurse Navigator Documentation Navigator Location: [facility] (10/15/15 1251) Navigator Encounter Type: Telephone (10/15/15 1251) Telephone: Appt Confirmation/Clarification (10/15/15 1251)     Spoke with Mr Maddison, informed her that her appts tomorrow in Radiation Oncology have been changed to 1:30 NE, 2:00 Dr. Isidore Moos.  She verbalized understanding.  Gayleen Orem, RN, BSN, [place] at [place] [phone]                                       Time Spent with Patient: 15 (10/15/15 1251)

## 2015-10-15 NOTE — Progress Notes (Signed)
Head and Neck Cancer Location of Tumor / Histology:  09/29/15 Diagnosis Mouth, biopsy, lower alveolus - PYOGENIC GRANULOMA.  Patient presented 1 month ago with symptoms of: As observed by PCP Dr. Coralyn Pear as [place]  Right neck mass:  First noticed about 6 months ago, she ignored it for a while  Initially soft, more movable. Was not painful. In last several months has noticed the size getting progressively bigger, less mobile, more tender (but not sure if this is because she is playing with it more)    Nutrition Status Yes No Comments  Weight changes? '[x]'$  '[]'$  She reports she has been losing weight (about 5 lbs) since diagnosis, although she reports she is eating more than ever.   Swallowing concerns? '[]'$  '[x]'$    PEG? '[]'$  '[x]'$     Referrals Yes No Comments  Social Work? '[]'$  '[x]'$    Dentistry? '[]'$  '[x]'$  Appointment scheduled 10/19/15  Swallowing therapy? '[]'$  '[]'$    Nutrition? '[]'$  '[x]'$  10/20/15  Med/Onc? '[x]'$  '[]'$  Dr. Alvy Bimler 10/16/15 at 11:30   Safety Issues Yes No Comments  Prior radiation? '[]'$  '[x]'$    Pacemaker/ICD? '[]'$  '[x]'$    Possible current pregnancy? '[]'$  '[x]'$    Is the patient on methotrexate? '[]'$  '[x]'$     Tobacco/Marijuana/Snuff/ETOH use: Never a smoker  Past/Anticipated interventions by otolaryngology, if any: Dr. Erik Obey. Last note written he had called Ms. Palecek and informed her of her CT results which showed that she has a large Right base of tongue mass and multiple right sided nodes.   Past/Anticipated interventions by medical oncology, if any: She has an appointment with Dr. Alvy Bimler 10/16/15 at 11:30.     Current Complaints / other details:  None  BP 153/69 mmHg  Pulse 77  Temp(Src) 98.5 F (36.9 C)  Ht '5\' 6"'$  (1.676 m)  Wt 165 lb 8 oz (75.07 kg)  BMI 26.73 kg/m2   Wt Readings from Last 3 Encounters:  10/16/15 165 lb 8 oz (75.07 kg)  10/16/15 163 lb 14.4 oz (74.345 kg)  09/11/15 166 lb 14.4 oz (75.705 kg)

## 2015-10-16 ENCOUNTER — Encounter: Payer: Self-pay | Admitting: *Deleted

## 2015-10-16 ENCOUNTER — Encounter: Payer: Self-pay | Admitting: Radiation Oncology

## 2015-10-16 ENCOUNTER — Ambulatory Visit: Payer: 59 | Admitting: Radiation Oncology

## 2015-10-16 ENCOUNTER — Ambulatory Visit
Admission: RE | Admit: 2015-10-16 | Discharge: 2015-10-16 | Disposition: A | Discharge status: Home / Self Care | Payer: 59 | Source: Ambulatory Visit | Attending: Radiation Oncology | Admitting: Radiation Oncology

## 2015-10-16 ENCOUNTER — Ambulatory Visit (HOSPITAL_COMMUNITY): Payer: 59

## 2015-10-16 ENCOUNTER — Encounter: Payer: Self-pay | Admitting: Hematology and Oncology

## 2015-10-16 ENCOUNTER — Ambulatory Visit (HOSPITAL_BASED_OUTPATIENT_CLINIC_OR_DEPARTMENT_OTHER): Payer: 59 | Admitting: Hematology and Oncology

## 2015-10-16 ENCOUNTER — Ambulatory Visit (HOSPITAL_COMMUNITY)
Admission: RE | Admit: 2015-10-16 | Discharge: 2015-10-16 | Disposition: A | Discharge status: Home / Self Care | Payer: 59 | Source: Ambulatory Visit | Attending: Otolaryngology | Admitting: Otolaryngology

## 2015-10-16 VITALS — BP 153/69 | HR 77 | Temp 98.5°F | Ht 66.0 in | Wt 165.5 lb

## 2015-10-16 VITALS — BP 182/67 | HR 71 | Temp 98.0°F | Resp 18 | Ht 66.0 in | Wt 163.9 lb

## 2015-10-16 DIAGNOSIS — L309 Dermatitis, unspecified: Secondary | ICD-10-CM | POA: Insufficient documentation

## 2015-10-16 DIAGNOSIS — I10 Essential (primary) hypertension: Secondary | ICD-10-CM | POA: Insufficient documentation

## 2015-10-16 DIAGNOSIS — E041 Nontoxic single thyroid nodule: Secondary | ICD-10-CM | POA: Insufficient documentation

## 2015-10-16 DIAGNOSIS — R634 Abnormal weight loss: Secondary | ICD-10-CM

## 2015-10-16 DIAGNOSIS — E785 Hyperlipidemia, unspecified: Secondary | ICD-10-CM | POA: Insufficient documentation

## 2015-10-16 DIAGNOSIS — D497 Neoplasm of unspecified behavior of endocrine glands and other parts of nervous system: Secondary | ICD-10-CM | POA: Diagnosis not present

## 2015-10-16 DIAGNOSIS — E042 Nontoxic multinodular goiter: Secondary | ICD-10-CM | POA: Diagnosis not present

## 2015-10-16 DIAGNOSIS — D563 Thalassemia minor: Secondary | ICD-10-CM | POA: Insufficient documentation

## 2015-10-16 DIAGNOSIS — Z510 Encounter for antineoplastic radiation therapy: Secondary | ICD-10-CM | POA: Insufficient documentation

## 2015-10-16 DIAGNOSIS — Z809 Family history of malignant neoplasm, unspecified: Secondary | ICD-10-CM | POA: Diagnosis not present

## 2015-10-16 DIAGNOSIS — C01 Malignant neoplasm of base of tongue: Secondary | ICD-10-CM

## 2015-10-16 DIAGNOSIS — E0789 Other specified disorders of thyroid: Secondary | ICD-10-CM

## 2015-10-16 NOTE — Progress Notes (Signed)
Radiation Oncology         [phone] ________________________________  Initial Outpatient Consultation  Name: Debra Farrell MRN: [id number]  Date: 10/16/2015  DOB: 03-Jun-1946  YQ:MVHQION Leonides Schanz, MD  [name], MD   REFERRING PHYSICIAN: [name], MD  DIAGNOSIS:    ICD-9-CM ICD-10-CM   1. 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 female who presented with symptoms from a right neck mass she noticed about 6 months ago but ignored. The size of the mass has been getting bigger, which lead her to see Dr. Raiford Noble.  Subsequently, the patient saw Dr. Raiford Noble in December 2016 who referred her to ENT.  On 09/29/2015 due to appreciation of loose teeth at the alveolar ridge and concerned for primary there, Dr. Lazarus Salines performed a biopsy, however this showed pyogenic granuloma and no malignancy.  According to tumor board discussion, the pt also underwent biopsy of the right neck which showed necrotic cells that were suspicious but not definitive for squamous cell carcinoma.  Pertinent imaging thus far includes a CT scan performed on 10/08/2015 revealing a large right base of tongue mass and multiple right sided nodes. This also showed an enlarged right lobe on the thyroid containing multiple nodules. PET scan on 10/14/2015 confirms hypermetabolic activity in the right tongue base and right neck, as well as mild hypermetabolic activity in the left level 3 lymph node. Scattered pulmonary nodules are indeterminate. She had an ultrasound of the thyroid, results pending.   Another biopsy will be pursued by otolaryngology of the neck or throat for definitive diagnosis. Patient has poor dentition and will likely undergo multiple dental extractions.  She is accompanied by a Animator. She (patient) is a  Engineer, civil (consulting).  Swallowing issues, if any: No  Weight Changes: Yes, she reports she has been losing weight (about 5 lbs) since diagnosis, although she reports she is eating more than ever. She has unintentionally lost 10 pounds.  PEG: No  Pain status: N/A  Other symptoms: No  Tobacco history, if any: Never a smoker.  ETOH abuse, if any: No  Prior cancers, if any: No  PREVIOUS RADIATION THERAPY: No  PAST MEDICAL HISTORY:  has a past medical history of Hypertension; Hyperlipidemia; Eczema; Thalassemia minor; and Cancer of base of tongue (HCC) (10/14/2015).    PAST SURGICAL HISTORY: Past Surgical History  Procedure Laterality Date  . Abdominal hysterectomy  1990    partial  . Incontinence surgery  1990, [id number]  . Rectocele repair    . Urocele      correction surgery    FAMILY HISTORY: family history includes Asthma in her father; Cancer in her mother.  SOCIAL HISTORY:  reports that she has never smoked. She has never used smokeless tobacco. She reports that she does not drink alcohol or use illicit drugs.  ALLERGIES: Morphine and related  MEDICATIONS:  Current Outpatient Prescriptions  Medication Sig Dispense Refill  . hydrochlorothiazide (HYDRODIURIL) 25 MG tablet Take 25 mg by mouth daily.     No current facility-administered medications for this encounter.    REVIEW OF SYSTEMS:  Notable for that above.   PHYSICAL EXAM:  height is 5\' 6"  (1.676 m) and weight is 165 lb 8 oz (75.07 kg). Her temperature is 98.5 F (36.9 C). Her blood pressure is  153/69 and her pulse is 77.   General: Alert and oriented, in no acute distress HEENT: Head is normocephalic. Extraocular movements are intact. Oropharynx is without visible lesions on external inspection. Oral cavity notable for some brittle teeth in posterior molars of the lower jaw bilaterally and loose teeth anteriorly. Small growth along the posterior midline of the lower alveolar ridge. Neck: Neck is notable for mass in upper  right neck that is 3 finger widths in dimension. No palpable masses in the left cervical or supraclavicular neck. Heart: Regular in rate and rhythm with no murmurs, rubs, or gallops. Chest: Clear to auscultation bilaterally, with no rhonchi, wheezes, or rales. Abdomen: Soft, nontender, nondistended, with no rigidity or guarding. Extremities: No cyanosis or edema. Lymphatics: see Neck Exam Skin: No concerning lesions. Musculoskeletal: symmetric strength and muscle tone throughout. Neurologic: Cranial nerves II through XII are grossly intact. No obvious focalities. Speech is fluent. Coordination is intact. Psychiatric: Judgment and insight are intact. Affect is appropriate.   ECOG = 0  0 - Asymptomatic (Fully active, able to carry on all predisease activities without restriction)  1 - Symptomatic but completely ambulatory (Restricted in physically strenuous activity but ambulatory and able to carry out work of a light or sedentary nature. For example, light housework, office work)  2 - Symptomatic, <50% in bed during the day (Ambulatory and capable of all self care but unable to carry out any work activities. Up and about more than 50% of waking hours)  3 - Symptomatic, >50% in bed, but not bedbound (Capable of only limited self-care, confined to bed or chair 50% or more of waking hours)  4 - Bedbound (Completely disabled. Cannot carry on any self-care. Totally confined to bed or chair)  5 - Death   [name], Creech RH, Tormey DC, et al. [phone]). "Toxicity and response criteria of the [facility]". Am. Evlyn Clines. Oncol. 5 (6): 649-55   LABORATORY DATA:  Lab Results  Component Value Date   WBC 7.2 09/11/2015   HGB 11.6* 09/11/2015   HCT 35.9* 09/11/2015   MCV 60.0* 09/11/2015   PLT 307 09/11/2015   CMP     Component Value Date/Time   NA 141 04/07/2015 1536   K 3.6 04/07/2015 1536   CL 100 04/07/2015 1536   CO2 29 04/07/2015 1536   GLUCOSE 114* 04/07/2015  1536   BUN 33* 04/07/2015 1536   CREATININE 0.82 10/08/2015 1406   CREATININE 1.04 04/07/2015 1536   CALCIUM 9.8 04/07/2015 1536   PROT 6.8 06/25/2014 1512   ALBUMIN 4.3 06/25/2014 1512   AST 22 06/25/2014 1512   ALT 19 06/25/2014 1512   ALKPHOS 94 06/25/2014 1512   BILITOT 0.4 06/25/2014 1512   GFRNONAA >60 10/08/2015 1406   GFRAA >60 10/08/2015 1406     No results found for: TSH     RADIOGRAPHY: Ct Soft Tissue Neck W Contrast  10/08/2015  CLINICAL DATA:  Cervical lymphadenopathy. EXAM: CT NECK WITH CONTRAST TECHNIQUE: Multidetector CT imaging of the neck was performed using the standard protocol following the bolus administration of intravenous contrast. CONTRAST:  75mL OMNIPAQUE IOHEXOL 300 MG/ML  SOLN COMPARISON:  None. FINDINGS: Pharynx and larynx: An avidly enhancing right tongue base mass measures 2.7 x 2.6 x 3.0 cm (transverse by AP by craniocaudal). This extends laterally on the right to the glossotonsillar sulcus and slightly extends leftward across the midline. The mass extends to the posterior superior aspect of the hyoid bone on the right without  evidence of osseous invasion. Multiple dental caries with periapical lucencies, most notably involving the left mandibular molars. Salivary glands: Parotid glands are partially obscured by metallic streak artifact from earrings, without abnormality identified. Submandibular glands are unremarkable aside from anterior displacement of the right submandibular gland by lymphadenopathy. Thyroid: Enlarged, heterogeneous right thyroid lobe containing multiple nodules including an approximately 3.3 x 2.4 x 3.7 cm nodule projecting post row inferiorly. Lymph nodes: Multiple enlarged, necrotic right level II lymph nodes including a level II/ III nodal mass measuring 3.5 x 2.5 cm. No suspicious lymph nodes identified in the left neck. Vascular: The right internal jugular vein is medially displaced and severely compressed/completely effaced in the mid  neck by the nodal mass but remains patent. A partial retropharyngeal course is noted of the internal carotid arteries. Limited intracranial: The visualized portion of the brain is unremarkable. Visualized orbits: Unremarkable aside from possible right cataract extraction. Mastoids and visualized paranasal sinuses: Minimal left maxillary sinus mucosal thickening. Skeleton: Mild lower cervical disc degeneration. No suspicious lytic or blastic osseous lesions identified. Upper chest: Pleural-parenchymal scarring in the lung apices. IMPRESSION: 1. 3 cm right tongue base mass consistent with malignancy. 2. Necrotic right level II and III nodal metastases. 3. Enlarged right thyroid lobe containing multiple nodules. Suggest thyroid ultrasound for further evaluation. Electronically Signed   By: Sebastian Ache M.D.   On: 10/08/2015 16:24   Nm Pet Image Initial (pi) Skull Base To Thigh  10/14/2015  CLINICAL DATA:  Initial treatment strategy for squamous cell carcinoma of the head/neck. EXAM: NUCLEAR MEDICINE PET SKULL BASE TO THIGH TECHNIQUE: 8.3 mCi F-18 FDG was injected intravenously. Full-ring PET imaging was performed from the skull base to thigh after the radiotracer. CT data was obtained and used for attenuation correction and anatomic localization. FASTING BLOOD GLUCOSE:  Value: 113 mg/dl COMPARISON:  CT neck 16/06/9603. FINDINGS: NECK Right tongue base mass is better measured on 10/08/2015 and has an SUV max of 26.7. Hypermetabolic right level 2 and 3 lymph nodes measure up to 2.6 cm in long axis in the right level 3 station, with an SUV max of 18.8. A 6 mm short axis left level 3 lymph node is seen deep to the left sternocleidomastoid muscle (CT image 42) and appears mildly hypermetabolic as well. CT images show no acute findings. CHEST No hypermetabolic mediastinal, hilar or axillary lymph nodes. No hypermetabolic pulmonary nodules. CT images show an enlarged and heterogeneous right lobe of the thyroid, as before.  No pericardial or pleural effusion. Biapical pleural parenchymal scarring. A few scattered tiny pulmonary nodules are too small for PET resolution. ABDOMEN/PELVIS No abnormal hypermetabolism in the liver, adrenal glands, spleen or pancreas. No hypermetabolic lymph nodes. CT images show the liver to be grossly unremarkable. Stones are seen in the gallbladder. Adrenal glands, kidneys, spleen, pancreas, stomach and bowel are grossly unremarkable. SKELETON No abnormal osseous hypermetabolism. Scattered foci of uptake along the costovertebral junctions do not have CT correlate. IMPRESSION: 1. Hypermetabolic right tongue base mass with hypermetabolic right level II and III adenopathy. Small left level III lymph node appears mildly hypermetabolic as well. 2. A few scattered tiny pulmonary nodules are too small for PET resolution. Continued attention on followup exams is warranted. 3. Enlarged heterogeneous right lobe of the thyroid, as before. Please correlate clinically. 4. Cholelithiasis. Electronically Signed   By: Leanna Battles M.D.   On: 10/14/2015 10:18      IMPRESSION/PLAN:  This is a delightful patient with BOT head and neck cancer.  I recommend radiotherapy for this patient.  We discussed the potential risks, benefits, and side effects of radiotherapy. We talked in detail about acute and late effects. We discussed that some of the most bothersome acute effects may be mucositis, dysgeusia, salivary changes, skin irritation, hair loss, dehydration, weight loss and fatigue. We talked about late effects which include but are not necessarily limited to dysphagia, hypothyroidism, nerve injury, spinal cord injury, xerostomia, trismus, and neck edema. No guarantees of treatment were given. A consent form was signed and placed in the patient's medical record. The patient is enthusiastic about proceeding with treatment. I look forward to participating in the patient's care.   Simulation (treatment planning) will  take place after her dental appointment and they clear her for radiation.  We also discussed that the treatment of head and neck cancer is a multidisciplinary process to maximize treatment outcomes and quality of life. For this reasons the following referrals have been or will be made:   Medical oncology to discuss chemotherapy. She had an appointment with Dr. Bertis Ruddy today at 11:30 am. Anticipate CDDP   Dentistry for dental evaluation, possible extractions in the radiation fields, and /or advice on reducing risk of cavities, osteoradionecrosis, or other oral issues. She has an appointment scheduled on 10/19/2015.   Nutritionist for nutrition support during and after treatment. A feeding tube will be ordered today.   Speech language pathology for swallowing and/or speech therapy.   Social work for social support.    Physical therapy due to risk of lymphedema in neck and deconditioning.   Baseline labs including TSH.  IR referral for PEG tube. __________________________________________   Lonie Peak, MD    This document serves as a record of services personally performed by Lonie Peak, MD. It was created on her behalf by Thomasene Lot, a trained medical scribe. The creation of this record is based on the scribe's personal observations and the provider's statements to them. This document has been checked and approved by the attending provider.

## 2015-10-16 NOTE — Progress Notes (Signed)
[facility] CONSULT NOTE  Patient Care Team: [name], MD as PCP - General [name], RN as Oncology Nurse Navigator [name], MD as Consulting Physician (Hematology and Oncology) [name], MD as Attending Physician (Radiation Oncology) [name], RD as Dietitian (Nutrition)  CHIEF COMPLAINTS/PURPOSE OF CONSULTATION:  Base of tongue cancer  HISTORY OF PRESENTING ILLNESS:  Debra Farrell 70 y.o. female is here because of newly diagnosed base of tongue cancer According to the patient, the first initial presentation was due to palpable lymphadenopathy in the right side of the neck. It has been going on for about 6 months she denies any hearing deficit, difficulties with chewing food, swallowing difficulties, painful swallowing or changes in the quality of her voice. She report 8 pounds weight loss. She had anorexia. I reviewed her records extensively and summarized as follows: Oncology History   Cancer of base of tongue ([facility])   Staging form: Lip and Oral Cavity, AJCC 7th Edition     Clinical stage from 10/14/2015: Stage IVA (T2, N2b, M0) - Signed by [name], MD on 10/19/2015       Cancer of base of tongue ([facility])   09/29/2015 Pathology Results Accession: [id number] FNA right neck showed necrotic cells suspicious for squamous cell cancer   09/29/2015 Pathology Results Accession: [id number] Mouth biopsy showed pyogenic granuloma   10/08/2015 Imaging CT neck showed 1. 3 cm right tongue base mass consistent with malignancy.2. Necrotic right level II and III nodal metastases.3. Enlarged right thyroid lobe containing multiple nodules.   10/14/2015 Imaging PET scan showed 1. Hypermetabolic right tongue base mass with hypermetabolic right level II and III adenopathy. Small left level III lymph node appears mildly hypermetabolic as well.2. A few scattered tiny pulmonary nodules are too small for PETresolution     MEDICAL HISTORY:  Past Medical History  Diagnosis  Date  . Hypertension   . Hyperlipidemia   . Eczema   . Thalassemia minor   . Cancer of base of tongue ([facility]) 10/14/2015    SURGICAL HISTORY: Past Surgical History  Procedure Laterality Date  . Abdominal hysterectomy  1990    partial  . Incontinence surgery  1990, [id number]  . Rectocele repair    . Urocele      correction surgery    SOCIAL HISTORY: Social History   Social History  . Marital Status: Unknown    Spouse Name: N/A  . Number of Children: N/A  . Years of Education: N/A   Occupational History  . Not on file.   Social History Main Topics  . Smoking status: Never Smoker   . Smokeless tobacco: Never Used  . Alcohol Use: No  . Drug Use: No  . Sexual Activity: No     Comment: widowed. 3 daughters. 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   Enjoys biking and walking.    Leeanne Mannan- Grandaughter- age 90- Has lived with Chesnie since birth.           FAMILY HISTORY: Family History  Problem Relation Age of Onset  . Cancer Mother     lung  . Asthma Father     ALLERGIES:  is allergic to morphine and related.  MEDICATIONS:  Current Outpatient Prescriptions  Medication Sig Dispense Refill  . hydrochlorothiazide (HYDRODIURIL) 25 MG tablet Take 25 mg by mouth  daily.     No current facility-administered medications for this visit.    REVIEW OF SYSTEMS:   Constitutional: Denies fevers, chills or abnormal night sweats Eyes: Denies blurriness of vision, double vision or watery eyes Ears, nose, mouth, throat, and face: Denies mucositis or sore throat Respiratory: Denies cough, dyspnea or wheezes Cardiovascular: Denies palpitation, chest discomfort or lower extremity swelling Gastrointestinal:  Denies nausea, heartburn or change in bowel habits Skin: Denies abnormal skin rashes Neurological:Denies numbness, tingling or new weaknesses Behavioral/Psych:  Mood is stable, no new changes  All other systems were reviewed with the patient and are negative.  PHYSICAL EXAMINATION: ECOG PERFORMANCE STATUS: 0 - Asymptomatic  Filed Vitals:   10/16/15 1054  BP: 182/67  Pulse: 71  Temp: 98 F (36.7 C)  Resp: 18   Filed Weights   10/16/15 1054  Weight: 163 lb 14.4 oz (74.345 kg)    GENERAL:alert, no distress and comfortable SKIN: skin color, texture, turgor are normal, no rashes or significant lesions EYES: normal, conjunctiva are pink and non-injected, sclera clear OROPHARYNX:no exudate, no erythema and lips, buccal mucosa, and tongue normal  NECK: supple, thyroid normal size, non-tender, without nodularity LYMPH:  She has palpable lymphadenopathy on the right side of the neck  LUNGS: clear to auscultation and percussion with normal breathing effort HEART: regular rate & rhythm and no murmurs and no lower extremity edema ABDOMEN:abdomen soft, non-tender and normal bowel sounds Musculoskeletal:no cyanosis of digits and no clubbing  PSYCH: alert & oriented x 3 with fluent speech NEURO: no focal motor/sensory deficits  LABORATORY DATA:  I have reviewed the data as listed Lab Results  Component Value Date   WBC 7.2 09/11/2015   HGB 11.6* 09/11/2015   HCT 35.9* 09/11/2015   MCV 60.0* 09/11/2015   PLT 307 09/11/2015   Lab Results  Component Value Date   NA 141 04/07/2015   K 3.6 04/07/2015   CL 100 04/07/2015   CO2 29 04/07/2015    RADIOGRAPHIC STUDIES: I have personally reviewed the radiological images as listed and agreed with the findings in the report. Ct Soft Tissue Neck W Contrast  10/08/2015  CLINICAL DATA:  Cervical lymphadenopathy. EXAM: CT NECK WITH CONTRAST TECHNIQUE: Multidetector CT imaging of the neck was performed using the standard protocol following the bolus administration of intravenous contrast. CONTRAST:  71m OMNIPAQUE IOHEXOL 300 MG/ML  SOLN COMPARISON:  None. FINDINGS: Pharynx and larynx: An avidly enhancing  right tongue base mass measures 2.7 x 2.6 x 3.0 cm (transverse by AP by craniocaudal). This extends laterally on the right to the glossotonsillar sulcus and slightly extends leftward across the midline. The mass extends to the posterior superior aspect of the hyoid bone on the right without evidence of osseous invasion. Multiple dental caries with periapical lucencies, most notably involving the left mandibular molars. Salivary glands: Parotid glands are partially obscured by metallic streak artifact from earrings, without abnormality identified. Submandibular glands are unremarkable aside from anterior displacement of the right submandibular gland by lymphadenopathy. Thyroid: Enlarged, heterogeneous right thyroid lobe containing multiple nodules including an approximately 3.3 x 2.4 x 3.7 cm nodule projecting post row inferiorly. Lymph nodes: Multiple enlarged, necrotic right level II lymph nodes including a level II/ III nodal mass measuring 3.5 x 2.5 cm. No suspicious lymph nodes identified in the left neck. Vascular: The right internal jugular vein is medially displaced and severely compressed/completely effaced in the mid neck by the nodal mass but remains patent. A partial retropharyngeal course is noted of  the internal carotid arteries. Limited intracranial: The visualized portion of the brain is unremarkable. Visualized orbits: Unremarkable aside from possible right cataract extraction. Mastoids and visualized paranasal sinuses: Minimal left maxillary sinus mucosal thickening. Skeleton: Mild lower cervical disc degeneration. No suspicious lytic or blastic osseous lesions identified. Upper chest: Pleural-parenchymal scarring in the lung apices. IMPRESSION: 1. 3 cm right tongue base mass consistent with malignancy. 2. Necrotic right level II and III nodal metastases. 3. Enlarged right thyroid lobe containing multiple nodules. Suggest thyroid ultrasound for further evaluation. Electronically Signed   By: Logan Bores M.D.   On: 10/08/2015 16:24   Nm Pet Image Initial (pi) Skull Base To Thigh  10/14/2015  CLINICAL DATA:  Initial treatment strategy for squamous cell carcinoma of the head/neck. EXAM: NUCLEAR MEDICINE PET SKULL BASE TO THIGH TECHNIQUE: 8.3 mCi F-18 FDG was injected intravenously. Full-ring PET imaging was performed from the skull base to thigh after the radiotracer. CT data was obtained and used for attenuation correction and anatomic localization. FASTING BLOOD GLUCOSE:  Value: 113 mg/dl COMPARISON:  CT neck 10/08/2015. FINDINGS: NECK Right tongue base mass is better measured on 10/08/2015 and has an SUV max of 26.7. Hypermetabolic right level 2 and 3 lymph nodes measure up to 2.6 cm in long axis in the right level 3 station, with an SUV max of 18.8. A 6 mm short axis left level 3 lymph node is seen deep to the left sternocleidomastoid muscle (CT image 42) and appears mildly hypermetabolic as well. CT images show no acute findings. CHEST No hypermetabolic mediastinal, hilar or axillary lymph nodes. No hypermetabolic pulmonary nodules. CT images show an enlarged and heterogeneous right lobe of the thyroid, as before. No pericardial or pleural effusion. Biapical pleural parenchymal scarring. A few scattered tiny pulmonary nodules are too small for PET resolution. ABDOMEN/PELVIS No abnormal hypermetabolism in the liver, adrenal glands, spleen or pancreas. No hypermetabolic lymph nodes. CT images show the liver to be grossly unremarkable. Stones are seen in the gallbladder. Adrenal glands, kidneys, spleen, pancreas, stomach and bowel are grossly unremarkable. SKELETON No abnormal osseous hypermetabolism. Scattered foci of uptake along the costovertebral junctions do not have CT correlate. IMPRESSION: 1. Hypermetabolic right tongue base mass with hypermetabolic right level II and III adenopathy. Small left level III lymph node appears mildly hypermetabolic as well. 2. A few scattered tiny pulmonary nodules are  too small for PET resolution. Continued attention on followup exams is warranted. 3. Enlarged heterogeneous right lobe of the thyroid, as before. Please correlate clinically. 4. Cholelithiasis. Electronically Signed   By: Lorin Picket M.D.   On: 10/14/2015 10:18   We review her case at the ENT tumor board. With necrotic tissue, further HPV status determination is not possible. Repeat biopsy along with dental extraction is recommended.  PLAN:  Cancer of base of tongue ([facility]) The patient would need further evaluation/biopsy for definitive confirmation of invasive cancer and for HPV testing. She would need full dental evaluation with possible dental extraction prior to the start of treatment. She would benefit from concurrent chemoradiation therapy. I plan to give her high dose cisplatin and she is in excellent health.   In preparation for treatment, the patient will need the following tests or referrals #1 Referral to Radiation Oncology for consultation.  #2 Referral to dentist for full dental evaluation and possible dental extraction  #3 Baseline audiogram  #4 Feeding tube placement.  #5 Infusaport placement #6 Referral to Speech Pathologist  #7 Referral to Nutritionist  #8  Referral to Social Worker #9 Possible Referral to chemotherapy class to learn about practical tips while on treatment.  #10 Blood work     Essential hypertension Her blood pressure is very high, is exacerbated by white coat hypertension. She will continue medical management.    All questions were answered. The patient knows to call the clinic with any problems, questions or concerns. I spent 55 minutes counseling the patient face to face. The total time spent in the appointment was 60 minutes and more than 50% was on counseling.     [facility], [place], MD 10/16/2015 3:17 PM

## 2015-10-19 ENCOUNTER — Ambulatory Visit (HOSPITAL_COMMUNITY): Payer: Self-pay | Admitting: Dentistry

## 2015-10-19 ENCOUNTER — Encounter (HOSPITAL_COMMUNITY): Payer: Self-pay | Admitting: Dentistry

## 2015-10-19 ENCOUNTER — Other Ambulatory Visit (HOSPITAL_COMMUNITY): Payer: Self-pay | Admitting: Otolaryngology

## 2015-10-19 ENCOUNTER — Telehealth: Payer: Self-pay | Admitting: *Deleted

## 2015-10-19 ENCOUNTER — Ambulatory Visit: Payer: 59 | Admitting: Hematology and Oncology

## 2015-10-19 VITALS — BP 183/79 | HR 68 | Temp 98.9°F

## 2015-10-19 DIAGNOSIS — IMO0002 Reserved for concepts with insufficient information to code with codable children: Secondary | ICD-10-CM

## 2015-10-19 DIAGNOSIS — M264 Malocclusion, unspecified: Secondary | ICD-10-CM

## 2015-10-19 DIAGNOSIS — Z01818 Encounter for other preprocedural examination: Secondary | ICD-10-CM | POA: Diagnosis not present

## 2015-10-19 DIAGNOSIS — K029 Dental caries, unspecified: Secondary | ICD-10-CM

## 2015-10-19 DIAGNOSIS — K0889 Other specified disorders of teeth and supporting structures: Secondary | ICD-10-CM

## 2015-10-19 DIAGNOSIS — M270 Developmental disorders of jaws: Secondary | ICD-10-CM

## 2015-10-19 DIAGNOSIS — K083 Retained dental root: Secondary | ICD-10-CM

## 2015-10-19 DIAGNOSIS — K036 Deposits [accretions] on teeth: Secondary | ICD-10-CM

## 2015-10-19 DIAGNOSIS — M799 Soft tissue disorder, unspecified: Secondary | ICD-10-CM

## 2015-10-19 DIAGNOSIS — K045 Chronic apical periodontitis: Secondary | ICD-10-CM

## 2015-10-19 DIAGNOSIS — C01 Malignant neoplasm of base of tongue: Secondary | ICD-10-CM | POA: Diagnosis not present

## 2015-10-19 DIAGNOSIS — E079 Disorder of thyroid, unspecified: Secondary | ICD-10-CM

## 2015-10-19 DIAGNOSIS — K08409 Partial loss of teeth, unspecified cause, unspecified class: Secondary | ICD-10-CM

## 2015-10-19 DIAGNOSIS — K0530 Chronic periodontitis, unspecified: Secondary | ICD-10-CM

## 2015-10-19 DIAGNOSIS — K030 Excessive attrition of teeth: Secondary | ICD-10-CM

## 2015-10-19 NOTE — Assessment & Plan Note (Signed)
The patient would need further evaluation/biopsy for definitive confirmation of invasive cancer and for HPV testing. She would need full dental evaluation with possible dental extraction prior to the start of treatment. She would benefit from concurrent chemoradiation therapy. I plan to give her high dose cisplatin and she is in excellent health.   In preparation for treatment, the patient will need the following tests or referrals #1 Referral to Radiation Oncology for consultation.  #2 Referral to dentist for full dental evaluation and possible dental extraction  #3 Baseline audiogram  #4 Feeding tube placement.  #5 Infusaport placement #6 Referral to Speech Pathologist  #7 Referral to Nutritionist  #8 Referral to Social Worker #9 Possible Referral to chemotherapy class to learn about practical tips while on treatment.  #10 Blood work

## 2015-10-19 NOTE — Telephone Encounter (Signed)
  Oncology Nurse Navigator Documentation Navigator Location: [facility] (10/19/15 1507) Navigator Encounter Type: Telephone (10/19/15 1507) Telephone: Jerilee Hoh Confirmation/Clarification (10/19/15 1507)     Called Ms Himmelberger to remind her of 0800 arrival for tomorrow morning's H&N MDC. LVMM.  Gayleen Orem, RN, BSN, [facility] [phone]                                       Time Spent with Patient: 15 (10/19/15 1507)

## 2015-10-19 NOTE — Progress Notes (Signed)
DENTAL CONSULTATION  Date of Consultation:  10/19/2015 Patient Name:   Debra Farrell Date of Birth:   [birth year] Medical Record Number: [id number]  VITALS: BP 183/79 mmHg  Pulse 68  Temp(Src) 98.9 F (37.2 C) (Oral)  CHIEF COMPLAINT: Patient referred by Dr. Lazarus Salines for a dental consultation.   HPI: Debra Farrell is a 70 year old female recently diagnosed with squamous cell carcinoma of the base of tongue. Patient with anticipated chemoradiation therapy. Patient now seen as part of a pre-chemoradiation therapy dental protocol examination.  Patient has a history of the lower left molar tooth pain for the past several weeks. This pain occurs on an intermittent basis. Patient indicates that the pain lasts for minutes at a time and usually occurs when she is chewing in that area. Patient describes the pain as being both sharp and dull in nature.  This tooth is currently not hurting her today.  The patient last saw a dentist approximately 6 years ago. This was by a dentist on [address] [facility].  The patient was referred to Dr. Barbette Merino (Oral surgeon) to have a tooth pulled at that time. Patient denies any complications from that dental extraction. Patient then had a bridge from tooth numbers 12-14 inserted after adequate healing. Patient indicates that the premolar abutment tooth (12) has hurt from "time to time" since then.  Patient indicates that she previously had periodontal surgery while she lived in [place]. Patient has lived in [place] since 2005.  PROBLEM LIST: Patient Active Problem List   Diagnosis Date Noted  . 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    Priority: High  . Skin lesion of back 03/21/2013  . Lipoma of axilla 03/21/2013  . Sleep disorder 03/17/2013  . Skin lesion 03/17/2013  . Health care maintenance 03/17/2013  . Right knee pain 04/05/2012  . Health maintenance examination 10/17/2011  . 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 Essential hypertension 10/17/2011  .  Hyperlipidemia 10/17/2011  . Overweight(278.02) 10/17/2011  . Headache(784.0) 10/17/2011    PMH: Past Medical History  Diagnosis Date  . Hypertension   . Hyperlipidemia   . Eczema   . Thalassemia minor   . 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    PSH: Past Surgical History  Procedure Laterality Date  . Abdominal hysterectomy  1990    partial  . Incontinence surgery  1990, [id number]  . Rectocele repair    . Urocele      correction surgery    ALLERGIES: Allergies  Allergen Reactions  . Morphine And Related Nausea And Vomiting    MEDICATIONS: Current Outpatient Prescriptions  Medication Sig Dispense Refill  . hydrochlorothiazide (HYDRODIURIL) 25 MG tablet Take 25 mg by mouth daily.     No current facility-administered medications for this visit.    LABS: Lab Results  Component Value Date   WBC 7.2 09/11/2015   HGB 11.6* 09/11/2015   HCT 35.9* 09/11/2015   MCV 60.0* 09/11/2015   PLT 307 09/11/2015      Component Value Date/Time   NA 141 04/07/2015 1536   K 3.6 04/07/2015 1536   CL 100 04/07/2015 1536   CO2 29 04/07/2015 1536   GLUCOSE 114* 04/07/2015 1536   BUN 33* 04/07/2015 1536   CREATININE 0.82 10/08/2015 1406   CREATININE 1.04 04/07/2015 1536   CALCIUM 9.8 04/07/2015 1536   GFRNONAA >60 10/08/2015 1406   GFRAA >60 10/08/2015 1406   No results found for: INR, PROTIME No results found for: PTT  SOCIAL HISTORY:  Social History   Social History  . Marital Status: Unknown    Spouse Name: N/A  . Number of Children: 3  . Years of Education: N/A   Occupational History  .  [employer]   Social History Main Topics  . Smoking status: Never Smoker   . Smokeless tobacco: Never Used  . Alcohol Use: No  . Drug Use: No  . 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 Not on file   Social History Narrative   The patient is widowed with 3 Daughters.    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   Enjoys biking and walking.    [name]- Grandaughter- age 109- Has lived with Annali since birth.           FAMILY HISTORY: Family History  Problem Relation Age of Onset  . Cancer Mother     lung  . Asthma Father     REVIEW OF SYSTEMS: Review of systems by Dr. Bertis Ruddy from 10/16/2015 was reviewed with the patient again and no changes were noted. See below.  Constitutional: Denies fevers, chills or abnormal night sweats Eyes: Denies blurriness of vision, double vision or watery eyes Ears, nose, mouth, throat, and face: Denies mucositis or sore throat Respiratory: Denies cough, dyspnea or wheezes Cardiovascular: Denies palpitation, chest discomfort or lower extremity swelling Gastrointestinal: Denies nausea, heartburn or change in bowel habits Skin: Denies abnormal skin rashes Neurological:Denies numbness, tingling or new weaknesses Behavioral/Psych: Mood is stable, no new changes  All other systems were reviewed with the patient and are negative.  DENTAL HISTORY: CHIEF COMPLAINT: Patient referred by Dr. Lazarus Salines for a dental consultation.   HPI: Debra Farrell is a 70 year old female recently diagnosed with squamous cell carcinoma of the base of tongue. Patient with anticipated chemoradiation therapy. Patient now seen as part of a pre-chemoradiation therapy dental protocol examination.  Patient has a history of the lower left molar tooth pain for the past several weeks. This pain occurs on an intermittent basis. Patient indicates that the pain lasts for minutes at a time and usually occurs when she is chewing in that area. Patient describes the pain as being both sharp and dull in nature.  This tooth is currently not hurting her today.  The patient last saw a dentist approximately 6 years ago. This was by a dentist on [address] [facility].  The patient was referred to Dr. Barbette Merino (Oral surgeon) to have a tooth pulled at that time. Patient denies any  complications from that dental extraction. Patient then had a bridge from tooth numbers 12-14 inserted after adequate healing. Patient indicates that the premolar abutment tooth (12) has hurt from "time to time" since then.  Patient indicates that she previously had periodontal surgery while she lived in [place]. Patient has lived in [place] since 2005.  DENTAL EXAMINATION: GENERAL: The patient is a well-developed, well-nourished female in no acute distress. HEAD AND NECK: She has 2 x 2 cm right neck lymphadenopathy. There is no palpable left lymphadenopathy. Patient denies any TMJ symptoms but has left TMJ crepitus. Maximum interincisal opening is 42 mm. INTRAORAL EXAM: Patient has normal saliva. The patient has an 8 mm x 9 mm pedunculated soft tissue lesion on the lingual aspect of tooth numbers 24 and 25 that was previously biopsied and diagnosed as being a pyogenic granuloma. Patient has a mandibular left lingual  torus. DENTITION: Patient is missing tooth numbers 1, 13, 16, 19, 30, and 32. Tooth numbers 17, 18, and 31 are present as retained root segments. Patient has maxillary and mandibular excessive incisal attrition. PERIODONTAL: The patient has significant chronic periodontitis with plaque and calculus accumulations, gingival recession, and tooth mobility as charted. Periodontal charting was performed. Patient has significant pocketing, bleeding on probing, and tooth mobility. DENTAL CARIES/SUBOPTIMAL RESTORATIONS: Patient has multiple dental caries as charted. ENDODONTIC: Patient with a history of acute pulpitis symptoms. Patient has chronic apical periodontitis involving tooth numbers 12, 17, 18, 25, and 31. CROWN AND BRIDGE: Patient has multiple crown and bridge restorations. PROSTHODONTIC: Patient denies having any partial dentures. OCCLUSION: Patient has a poor occlusal scheme secondary to multiple missing teeth, multiple retained root segments, supra-eruption and drifting of the  unopposed teeth into the edentulous areas, deep overbite and excessive overjet, and lack of replacement missing teeth with dental prostheses.  RADIOGRAPHIC INTERPRETATION: An orthopantogram was taken and supplemented with 14 periapical radiographs and 4 bitewings. There are multiple missing teeth. There are multiple retained root segments. There is evidence of periapical pathology and radiolucency. There is supra-eruption and drifting of the unopposed teeth into the edentulous areas. There is moderate to severe bone loss. Radiographic calculus is noted. There is evidence of maxillary and mandibular interincisal attrition. Dental caries are noted.  ASSESSMENTS: 1. Squamous cell carcinoma of the base of tongue 2. Pre-chemo radiation therapy dental protocol 3. History of acute pulpitis 4. Chronic apical periodontitis 5. Multiple retained root segments 6. Mandibular left lingual torus 7. Soft tissue lesion of the mandibular left lingual area 24/25-presumed pyogenic granuloma as per previous biopsy. 8. Chronic periodontitis with bone loss 9. Accretions 10. Gingival recession 11. Generalized tooth mobility 12. Maxillary and mandibular incisal attrition 13. Multiple missing teeth 14. Deep overbite and excessive overjet 15. Poor occlusal scheme and malocclusion  PLAN/RECOMMENDATIONS: 1. I discussed the risks, benefits, and complications of various treatment options with the patient in relationship to her medical and dental conditions, anticipated  chemoradiation therapy,  and chemoradiation therapy side effects to include xerostomia, radiation caries, trismus, mucositis, taste changes, gum and jawbone changes, and risk for infection, bleeding, and osteoradionecrosis. We discussed various treatment options to include no treatment, multiple extractions with alveoloplasty, pre-prosthetic surgery as indicated, periodontal therapy, dental restorations, root canal therapy, crown and bridge therapy, implant  therapy, and replacement of missing teeth as indicated after adequate healing. The patient currently wishes to proceed with extraction of remaining teeth with alveoloplasty and pre-prosthetic surgery as needed in the operating room with general anesthesia. This surgery will be coordinated with Dr. Lazarus Salines.  The patient will then need to follow up with the dentist of her choice for fabrication of upper and lower complete dentures approximately 3 months from the last date of radiation therapy. Chemoradiation therapy should be able to be started in approximately 2 weeks from the date of the dental surgery.  2. Discussion of findings with medical team and coordination of future medical and dental care as needed.  I spent in excess of 90 minutes during the conduct of this consultation and >50% of this time involved direct face-to-face encounter for counseling and/or coordination of the patient's care.    Charlynne Pander, DDS

## 2015-10-19 NOTE — Patient Instructions (Signed)

## 2015-10-19 NOTE — Assessment & Plan Note (Signed)
Her blood pressure is very high, is exacerbated by white coat hypertension. She will continue medical management.

## 2015-10-20 ENCOUNTER — Ambulatory Visit: Payer: 59

## 2015-10-20 ENCOUNTER — Ambulatory Visit: Payer: 59 | Admitting: Nutrition

## 2015-10-20 ENCOUNTER — Encounter (HOSPITAL_COMMUNITY): Payer: Self-pay | Admitting: Dentistry

## 2015-10-20 ENCOUNTER — Ambulatory Visit: Payer: 59 | Attending: Radiation Oncology | Admitting: Physical Therapy

## 2015-10-20 ENCOUNTER — Other Ambulatory Visit: Payer: Self-pay | Admitting: Hematology and Oncology

## 2015-10-20 ENCOUNTER — Encounter: Payer: Self-pay | Admitting: *Deleted

## 2015-10-20 ENCOUNTER — Telehealth: Payer: Self-pay | Admitting: *Deleted

## 2015-10-20 ENCOUNTER — Ambulatory Visit
Admission: RE | Admit: 2015-10-20 | Discharge: 2015-10-20 | Disposition: A | Discharge status: Home / Self Care | Payer: 59 | Source: Ambulatory Visit | Attending: Radiation Oncology | Admitting: Radiation Oncology

## 2015-10-20 ENCOUNTER — Ambulatory Visit: Payer: Self-pay | Admitting: Physical Therapy

## 2015-10-20 VITALS — BP 162/87 | HR 67 | Temp 97.8°F | Ht 66.0 in

## 2015-10-20 DIAGNOSIS — Z9189 Other specified personal risk factors, not elsewhere classified: Secondary | ICD-10-CM | POA: Diagnosis not present

## 2015-10-20 DIAGNOSIS — R1310 Dysphagia, unspecified: Secondary | ICD-10-CM | POA: Insufficient documentation

## 2015-10-20 DIAGNOSIS — M2560 Stiffness of unspecified joint, not elsewhere classified: Secondary | ICD-10-CM | POA: Diagnosis not present

## 2015-10-20 DIAGNOSIS — C01 Malignant neoplasm of base of tongue: Secondary | ICD-10-CM

## 2015-10-20 NOTE — Progress Notes (Signed)
Patient was seen in head and neck clinic.  70 year old female diagnosed with tongue cancer.  She is a patient of Dr. Alvy Bimler and Dr. Isidore Moos.  Past medical history includes hypertension, and hyperlipidemia.  Medications were reviewed.  Labs include glucose 113.  Height: 66 inches. Weight: 165.5 pounds January 20. Usual body weight: 165-175 pounds. BMI: 26.73.  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 States she is receiving a feeding tube tomorrow. She describes herself as a very picky eater and does not seem to consume much protein. Patient dislikes oral nutrition supplements.  Nutrition diagnosis:  Predicted suboptimal energy intake related to new diagnosis of tongue cancer and associated treatments as evidenced by history or presence of this condition for which research shows an increased incidence of suboptimal energy intake.  Intervention:  Educated patient to consume smaller, more frequent meals increasing calories and protein to promote weight maintenance. Educated patient on strategies for eating if she develops nausea and vomiting and sore mouth. Provided recipes for shakes and smoothies. Provided fact sheets on increasing calories and protein, nausea, vomiting, sore mouth, and recipes. Educated patient to begin Ensure Plus or equivalent. One can via feeding tube twice a day with 60 cc free water before and after bolus feeding. Patient has good understanding.  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 Questions were answered.  Teach back method used.  Monitoring, evaluation, goals: Patient will tolerate adequate calories and protein for minimal weight loss.  Next visit: To be scheduled with treatment.  **Disclaimer: This note was dictated with voice recognition software. Similar sounding words can inadvertently be transcribed and this note may contain transcription errors which may not have been corrected upon publication of  note.**

## 2015-10-20 NOTE — Progress Notes (Signed)
Patient here for the Head and Neck clinic. Vitals signs taken, meds and allergies reviewed.

## 2015-10-20 NOTE — Telephone Encounter (Addendum)
  Oncology Nurse Navigator Documentation Navigator Location: [facility] (10/20/15 1431) Navigator Encounter Type: Telephone (10/20/15 1431) Telephone: Lahoma Crocker Call;Appt Confirmation/Clarification (10/20/15 1431)     Called Ms Reach to inform her that per further discussion with Care Team members, tomorrow's PEG placement is being cancelled. I further explained PEG and port-a-cath placement will be rescheduled sometime in early February following dental extractions and before start of chemo and RT tmts.  She verbalized understanding. I spoke with Juliann Pulse at [facility] IR re cancellation.  Gayleen Orem, RN, BSN, [place] at [place] [phone]                                       Time Spent with Patient: 15 (10/20/15 1431)

## 2015-10-20 NOTE — Therapy (Signed)
[facility] [address] [place], [place], [address] Phone: [phone]   Fax:  [phone]  Physical Therapy Evaluation  Patient Details  Name: Debra Farrell MRN: [id number] Date of Birth: 29-Oct-1945 Referring Provider: Dr. Lonie Peak  Encounter Date: 10/20/2015      PT End of Session - 10/20/15 1118    Visit Number 1   Number of Visits 1   PT Start Time 0905   PT Stop Time 0935   PT Time Calculation (min) 30 min   Activity Tolerance Patient tolerated treatment well   Behavior During Therapy [facility] for tasks assessed/performed      Past Medical History  Diagnosis Date  . Hypertension   . Hyperlipidemia   . Eczema   . Thalassemia minor   . 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    Past Surgical History  Procedure Laterality Date  . Abdominal hysterectomy  1990    partial  . Incontinence surgery  1990, [id number]  . Rectocele repair    . Urocele      correction surgery    There were no vitals filed for this visit.  Visit Diagnosis:  At risk for lymphedema - Plan: PT plan of care cert/re-cert  Joint stiffness of spine - Plan: PT plan of care cert/re-cert      Subjective Assessment - 10/20/15 1057    Subjective "You've gotta have a positive attitude with all this."   Pertinent History Pt. with right neck mass x 6 monhts.  Diagnosed with right base of tongue mass and positive nodes (stage IVA), expected to be treated with chemoradiation.  HTN.   Patient Stated Goals get information from all head & neck clinic providers   Currently in Pain? No/denies            [facility] PT Assessment - 10/20/15 1110    Assessment   Medical Diagnosis right base of tongue cancer with positive nodes   Referring Provider Dr. Lonie Peak   Precautions   Precautions Other (comment)   Precaution Comments cancer precautions   Restrictions   Weight Bearing Restrictions No   Balance Screen   Has the patient fallen in the  past 6 months No   Has the patient had a decrease in activity level because of a fear of falling?  No   Is the patient reluctant to leave their home because of a fear of falling?  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, civil (consulting) at [employer]   Leisure reports being very active; just got a new bike, has a Photographer, does yardwork and household chores   Cognition   Overall Cognitive Status Within Functional Limits for tasks assessed   Observation/Other Assessments   Observations alert and energetic-appearing woman   Functional Tests   Functional tests Sit to Stand   Sit to Stand   Comments 17 repetitions in 30 seconds (above average)  leg muscles tired   Posture/Postural Control   Posture/Postural Control Postural limitations   Postural Limitations Rounded Shoulders;Forward head  slight   ROM / Strength   AROM / PROM / Strength AROM;Strength   AROM   Overall AROM Comments neck AROM WFL except sidebend 25% loss bilaterally and rotation 10% loss;  shoulder AROM WNL throughout   Strength   Overall Strength Comments both shoulders grossly 5/5   Palpation   Palpation comment rightneck lump firm and approx. golf-ball sized   Ambulation/Gait   Ambulation/Gait Yes   Ambulation/Gait Assistance 7: Independent           LYMPHEDEMA/ONCOLOGY QUESTIONNAIRE - 10/20/15 1107    Type   Cancer Type base of tongue   Lymphedema Assessments   Lymphedema Assessments Head and Neck   Head and Neck   4 cm superior to sternal notch around neck 38 cm   6 cm superior to sternal notch around neck 34.9 cm   8 cm superior to sternal notch around neck 34.8 cm   Other at 10 cm. proximal to sternal notch, 36.8 cm.                        PT Education - 10/20/15 1117     Education provided Yes   Education Details posture, neck ROM, diaphragmatic breathing, walking or other cardiovascular exercise, Cure article on staying active, lymphedema and PT info   Person(s) Educated Patient   Methods Explanation;Handout   Comprehension Verbalized understanding                 Head and Neck Clinic Goals - 10/20/15 1123    Patient will be able to verbalize understanding of a home exercise program for cervical range of motion, posture, and walking.    Status Achieved   Patient will be able to verbalize understanding of proper sitting and standing posture.    Status Achieved   Patient will be able to verbalize understanding of lymphedema risk and availability of treatment for this condition.    Status Achieved           Plan - 10/20/15 1118    Clinical Impression Statement This is a very pleasant woman who is a Engineer, civil (consulting) at [employer] and who reports being very active. She looks like she does stay fit and did well on functional sit to stand testing as well as shoulder strength testing.  Her neck AROM does show some limitation, and she feels this is directly from the lump in her neck that is easily visible and palpable today.     Pt will benefit from skilled therapeutic intervention in order to improve on the following deficits Decreased range of motion;Other (comment)  at risk for lymphedema   Rehab Potential Excellent   PT Frequency One time visit   PT Treatment/Interventions Patient/family education   PT Next Visit Plan None at this time; patient may benefit from treatment going forward if lymphedema develops or other functional limitations that can result from chemoradiation.   PT Home Exercise Plan see education section   Consulted and Agree with Plan of Care Patient         Problem List Patient Active Problem List   Diagnosis Date Noted  . 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  . Skin lesion of back 03/21/2013  . Lipoma  of axilla 03/21/2013  . Sleep disorder 03/17/2013  . Skin lesion 03/17/2013  . Health care maintenance 03/17/2013  . Right knee pain 04/05/2012  . Health maintenance examination 10/17/2011  . 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 Essential hypertension 10/17/2011  . Hyperlipidemia 10/17/2011  . Overweight(278.02) 10/17/2011  . Headache(784.0) 10/17/2011    SALISBURY,DONNA 10/20/2015, 11:27 AM  [facility] [address] [place], [place], [address] Phone: [phone]  Fax:  [phone]  Name: Debra Farrell MRN: [id number] Date of Birth: 02-23-1946   Micheline Maze, PT 10/20/2015 11:27 AM

## 2015-10-20 NOTE — Progress Notes (Addendum)
  Oncology Nurse Navigator Documentation Navigator Location: [facility] (10/16/15 1050) Navigator Encounter Type: Initial MedOnc (10/16/15 1050)           Patient Visit Type: MedOnc (10/16/15 1050)     Met with patient during initial consult with Dr. Alvy Bimler.  She was accompanied by her friend/co-worker Ginger Gleaman. I further introduced myself as her Navigator, explained my role as a member of the Care Team. Provided New Patient Information packet:  Contact information for physician, this navigator, other members of the Care Team  Fall Prevention Patient [place] sheet  [facility] campus map with highlight of [place].  She stated she uses [facility] since she works at [employer]. She indicated she has Advance Directives, will bring copies. 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  Denies pain, N&V, SOB, chest pains, difficulty swallowing.  Denies that she drinks/smokes.  Lives by self (widowed).  Has 3 dtrs, 2 of whom live in [place], the other lives in [place].  G-dtr in [place] recently committed suicide (hanging). She verbalized understanding of Dr. Calton Dach discussion of CT and PET results. She verbalized understanding that she will receive concurrent RT and cisplatin chemotherapy, that she will have baseline audiogram prior to start of chemo.  Per Dr. Alvy Bimler, I will contact Dr. Noreene Filbert office to arrange. Showed them the location of Dr. Ritta Slot office and [facility] as reference for future appts, including arrival procedure for these appts.   She understands I will be joining them for this afternoon's consult with Dr. Isidore Moos.  Gayleen Orem, RN, BSN, [place] at [place] [phone]                             Time Spent with Patient: 45 (10/16/15 1050)

## 2015-10-20 NOTE — Progress Notes (Signed)
  Oncology Nurse Navigator Documentation Navigator Location: [facility] (10/16/15 1410) Navigator Encounter Type: Initial RadOnc (10/16/15 1410)           Patient Visit Type: CEQFDV (10/16/15 1410)       Met with Ms Hutto and her friend during initial consult with Dr. Isidore Moos.     Provided introductory explanation of radiation treatment including SIM planning and purpose of Aquaplast head and shoulder mask, showed them example.    Provided photos/diagrams of feeding tube and PAC, explained their purpose.  Provided a tour of SIM and Tomo areas, explained treatment and arrival procedures. I encouraged them to contact me with questions/concerns as treatments/procedures begin.   Gayleen Orem, RN, BSN, [place] at [place] [phone]                             Time Spent with Patient: 60 (10/16/15 1410)

## 2015-10-20 NOTE — Progress Notes (Signed)
  Oncology Nurse Navigator Documentation  Met with patient prior to, during and after H&N MDC.   She informed me that per her appt with Dr. Enrique Sack yesterday, she is moving forward with full extractions.  Procedure is scheduled for 10/28/15. She denied any needs/concerns at this time, understands to contact me should that change.   Gayleen Orem, RN, BSN, [place] at [place] [phone]

## 2015-10-20 NOTE — Progress Notes (Signed)
A user error has taken place: encounter opened in error, closed for administrative reasons.

## 2015-10-21 ENCOUNTER — Telehealth: Payer: Self-pay | Admitting: *Deleted

## 2015-10-21 ENCOUNTER — Other Ambulatory Visit: Payer: Self-pay | Admitting: Hematology and Oncology

## 2015-10-21 ENCOUNTER — Other Ambulatory Visit (HOSPITAL_COMMUNITY): Payer: Self-pay

## 2015-10-21 ENCOUNTER — Ambulatory Visit: Payer: 59 | Admitting: Radiation Oncology

## 2015-10-21 ENCOUNTER — Ambulatory Visit (HOSPITAL_COMMUNITY): Payer: 59

## 2015-10-21 ENCOUNTER — Other Ambulatory Visit (HOSPITAL_COMMUNITY): Payer: Self-pay | Admitting: Dentistry

## 2015-10-21 DIAGNOSIS — C01 Malignant neoplasm of base of tongue: Secondary | ICD-10-CM

## 2015-10-21 NOTE — Patient Instructions (Signed)
SWALLOWING EXERCISES Do these 6 of the 7 days per week until 6 months until your last day of radiation, then 2-3 times per week afterwards  1. Effortful Swallows - Press your tongue against the roof of your mouth for 3 seconds and then squeeze hard with the muscles in your neck while you swallow your  saliva or a sip of water - Repeat 15-20 times, 2-3 times a day, and use whenever you eat or drink  2. Masako Swallow - swallow with your tongue sticking out - Stick tongue out past your teeth and gently bite tongue with your teeth - Swallow, while holding your tongue with your teeth - Repeat 15-20 times, 2-3 times a day *use a wet spoon if your mouth gets dry*  3. Pitch Raise - Repeat "he", once per second in as high of a pitch as you can - Repeat 20 times, 2-3 times a day  4. Shaker Exercise - head lift - Lie flat on your back in your bed or on a couch without pillows - Raise your head and look at your feet - KEEP YOUR SHOULDERS DOWN - HOLD FOR 45-60 SECONDS, then lower your head back down - Repeat 3 times, 2 times a day  5. Mendelsohn Maneuver - "half swallow" exercise - Start to swallow, and keep your Adam's apple up by squeezing hard with the muscles of the throat - Hold the squeeze for 5-7 seconds and then relax - Repeat 15 times, 2-3 times a day *use a wet spoon if your mouth gets dry*  6. Breath Hold - Say "HUH!" loudly, then hold your breath for 3 seconds at your voice box - Repeat 20 times, 2-3 times a day  7. Chin pushback - Open your mouth  - Place your fist UNDER your chin near your neck, and push back with your fist for 5 seconds - Repeat 8-10 times, 2-3 times a day

## 2015-10-21 NOTE — Therapy (Signed)
[facility] [address] [place], [place], [address] Phone: [phone]   Fax:  [phone]  Speech Language Pathology Evaluation  Patient Details  Name: Debra Farrell MRN: [id number] Date of Birth: March 17, 1946 Referring Provider: Lonie Peak MD  Encounter Date: 10/20/2015      End of Session - 10/21/15 1706    Visit Number 1   Number of Visits 3   Date for SLP Re-Evaluation 12/18/15   SLP Start Time 0920   SLP Stop Time  1000   SLP Time Calculation (min) 40 min   Activity Tolerance Patient tolerated treatment well      Past Medical History  Diagnosis Date  . Hypertension   . Hyperlipidemia   . Eczema   . Thalassemia minor   . 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    Past Surgical History  Procedure Laterality Date  . Abdominal hysterectomy  1990    partial  . Incontinence surgery  1990, [id number]  . Rectocele repair    . Urocele      correction surgery    There were no vitals filed for this visit.  Visit Diagnosis: Dysphagia          SLP Evaluation [facility] - 10/21/15 1713    SLP Visit Information   SLP Received On 10/20/15   Referring Provider Lonie Peak MD   Medical Diagnosis Cancer- base of tongue   Pain Assessment   Currently in Pain? No/denies   Prior Functional Status   Cognitive/Linguistic Baseline Within functional limits   Cognition   Overall Cognitive Status Within Functional Limits for tasks assessed   Auditory Comprehension   Overall Auditory Comprehension Appears within functional limits for tasks assessed   Verbal Expression   Overall Verbal Expression Appears within functional limits for tasks assessed   Oral Motor/Sensory Function   Overall Oral Motor/Sensory Function Appears within functional limits for tasks assessed   Labial ROM Within Functional Limits   Motor Speech   Overall Motor Speech Appears within functional limits for tasks assessed       Pt  currently tolerates regular diet and thin liquids. Oral motor assessment: as above. POs: Pt ate [place] sandwich and drank H2O without overt s/s aspiration. Thyroid elevation appeared WNL, and swallows appeared timely. Oral residue noted as none. Pt's swallow deemed WFL/WNL at this time.   Because data states the risk for dysphagia during and after radiation treatment is high due to undergoing radiation tx, SLP taught pt about the possibility of reduced/limited ability for PO intake during rad tx. SLP encouraged pt to continue swallowing POs as far into rad tx as possible, even ingesting POs and/or completing HEP shortly after administration of pain meds.   SLP educated pt re: changes to swallowing musculature after rad tx, and why adherence to dysphagia HEP provided today and PO consumption was necessary to inhibit possible muscular disuse atrophy and to reduce muscle fibrosis following rad tx. Pt demonstrated understanding of these things to SLP.     After eval tasks, SLP then developed a HEP for pt and pt was instructed how to perform exercises involving lingual, vocal, and pharyngeal strengthening. SLP performed each exercise and pt return demonstrated each exercise. SLP ensured pt performance was correct prior to moving on to next exercise. Pt was instructed to complete this program 2-3 times a day, 6-7 days/week until 6 months after her last rad tx, then x2-3 a week after that.  SLP Education - 10/21/15 1705    Education provided Yes   Education Details HEP, late effects head/neck radiation   Person(s) Educated Patient   Methods Explanation;Demonstration;Handout;Verbal cues   Comprehension Verbalized understanding;Returned demonstration;Verbal cues required          SLP Short Term Goals - 10/21/15 1715    SLP SHORT TERM GOAL #1   Title pt will demo HEP with rare min A   Time 1   Period --  visit   Status New   SLP SHORT TERM GOAL #2   Title pt will  provide SLP with rationale for doing HEP   Time 1   Period --  visit   Status New          SLP Long Term Goals - 10/21/15 1716    SLP LONG TERM GOAL #1   Title pt will demo HEP over two sessions with modified independence   Time 3   Period --  visits   Status New   SLP LONG TERM GOAL #2   Title pt will tell SLP three s/s aspiration PNA with modifiedi independence   Time 3   Period --  visits   Status New          Plan - 10/21/15 1713    Clinical Impression Statement Pt presents with swallowing essentially WNL, however risk for aspiration increases as pt progresses through rad tx. Skilled ST is needed to assess pt during and after treatment with success with HEP, and pt safety with POs.   Speech Therapy Frequency --  approx every 4 weeks   Duration --  for 60 days   Treatment/Interventions Aspiration precaution training;Pharyngeal strengthening exercises;Oral motor exercises;Diet toleration management by SLP;Patient/family education;SLP instruction and feedback   Potential to Achieve Goals Good   SLP Home Exercise Plan provided today   Consulted and Agree with Plan of Care Patient          G-Codes - 10-22-2015 1709    Functional Assessment Tool Used noms   Functional Limitations Swallowing   Swallow Current Status (Y7829) 0 percent impaired, limited or restricted   Swallow Goal Status (F6213) At least 1 percent but less than 20 percent impaired, limited or restricted      Problem List Patient Active Problem List   Diagnosis Date Noted  . 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  . Skin lesion of back 03/21/2013  . Lipoma of axilla 03/21/2013  . Sleep disorder 03/17/2013  . Skin lesion 03/17/2013  . Health care maintenance 03/17/2013  . Right knee pain 04/05/2012  . Health maintenance examination 10/17/2011  . 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 Essential hypertension 10/17/2011  . Hyperlipidemia 10/17/2011  . Overweight(278.02) 10/17/2011  . Headache(784.0)  10/17/2011    Quamere Mussell , MS, CCC-SLP  10/21/2015, 5:19 PM  [facility] [facility] [address] [place], [place], [address] Phone: [phone]   Fax:  [phone]  Name: KATHERN CHESTNUT MRN: [id number] Date of Birth: 11-18-1945

## 2015-10-21 NOTE — Telephone Encounter (Addendum)
  Oncology Nurse Navigator Documentation Navigator Location: [facility] (10/21/15 0844) Navigator Encounter Type: Telephone (10/21/15 0844) Telephone: Outgoing Call (10/21/15 0844)                 Interventions: Coordination of Care (10/21/15 0844)       Per Dr. Alvy Bimler, called [facility] E[facility] Audiology, spoke with Lovey Newcomer, requested Ms Govan be contacted to arrange appt for baseline audiogram prior to anticipated 2/15 start of cisplatin chemotherapy.  I asked to be notified when appt scheduled.  She verbalized understanding, indicated she would call Ms Gillum to arrange.  Gayleen Orem, RN, BSN, [place] at [place] [phone]                   Time Spent with Patient: 15 (10/21/15 0844)

## 2015-10-21 NOTE — Telephone Encounter (Signed)
  Oncology Nurse Navigator Documentation  Navigator Location: [facility] (10/21/15 0935) Navigator Encounter Type: Telephone (10/21/15 0935) Telephone: Incoming Call;Appt Confirmation/Clarification (10/21/15 0935)                 Interventions: Coordination of Care (10/21/15 0935)     Received call from [facility] Audiology stating Ms Bi's audiogram is scheduled for tomorrow at 1:00 in conjunction with an appt with Dr. Erik Obey.  I informed Dr. Alvy Bimler.  Gayleen Orem, RN, BSN, [place] at [place] [phone]                     Time Spent with Patient: 15 (10/21/15 0935)

## 2015-10-22 ENCOUNTER — Telehealth: Payer: Self-pay | Admitting: *Deleted

## 2015-10-22 DIAGNOSIS — H903 Sensorineural hearing loss, bilateral: Secondary | ICD-10-CM | POA: Diagnosis not present

## 2015-10-22 DIAGNOSIS — R221 Localized swelling, mass and lump, neck: Secondary | ICD-10-CM | POA: Diagnosis not present

## 2015-10-22 DIAGNOSIS — R220 Localized swelling, mass and lump, head: Secondary | ICD-10-CM | POA: Diagnosis not present

## 2015-10-22 MED FILL — HYDROCOD-APAP 7.5-325/15ML: 7.5-325 | 3 days supply | Qty: 400 | Fill #0

## 2015-10-22 MED FILL — CHLORHEXIDINE 0.12% RINSE: 0.12 | 23 days supply | Qty: 473 | Fill #0

## 2015-10-23 ENCOUNTER — Encounter (HOSPITAL_COMMUNITY): Payer: Self-pay

## 2015-10-23 ENCOUNTER — Encounter: Payer: Self-pay | Admitting: Radiation Oncology

## 2015-10-23 ENCOUNTER — Telehealth: Payer: Self-pay | Admitting: Hematology and Oncology

## 2015-10-23 ENCOUNTER — Other Ambulatory Visit: Payer: Self-pay

## 2015-10-23 ENCOUNTER — Encounter (HOSPITAL_COMMUNITY)
Admission: RE | Admit: 2015-10-23 | Discharge: 2015-10-23 | Disposition: A | Discharge status: Home / Self Care | Payer: 59 | Source: Ambulatory Visit | Attending: Otolaryngology | Admitting: Otolaryngology

## 2015-10-23 ENCOUNTER — Other Ambulatory Visit: Payer: Self-pay | Admitting: Otolaryngology

## 2015-10-23 DIAGNOSIS — E785 Hyperlipidemia, unspecified: Secondary | ICD-10-CM | POA: Diagnosis not present

## 2015-10-23 DIAGNOSIS — Z885 Allergy status to narcotic agent status: Secondary | ICD-10-CM | POA: Diagnosis not present

## 2015-10-23 DIAGNOSIS — I10 Essential (primary) hypertension: Secondary | ICD-10-CM | POA: Diagnosis not present

## 2015-10-23 DIAGNOSIS — C760 Malignant neoplasm of head, face and neck: Secondary | ICD-10-CM | POA: Diagnosis not present

## 2015-10-23 DIAGNOSIS — E042 Nontoxic multinodular goiter: Secondary | ICD-10-CM | POA: Diagnosis not present

## 2015-10-23 DIAGNOSIS — D563 Thalassemia minor: Secondary | ICD-10-CM | POA: Diagnosis not present

## 2015-10-23 DIAGNOSIS — Z01818 Encounter for other preprocedural examination: Secondary | ICD-10-CM | POA: Diagnosis not present

## 2015-10-23 DIAGNOSIS — Z01812 Encounter for preprocedural laboratory examination: Secondary | ICD-10-CM | POA: Diagnosis not present

## 2015-10-23 HISTORY — DX: Reserved for concepts with insufficient information to code with codable children: IMO0002

## 2015-10-23 LAB — CBC
HEMATOCRIT: 35.9 % — AB (ref 36.0–46.0)
HEMOGLOBIN: 11.1 g/dL — AB (ref 12.0–15.0)
MCH: 19.2 pg — ABNORMAL LOW (ref 26.0–34.0)
MCHC: 30.9 g/dL (ref 30.0–36.0)
MCV: 62.1 fL — ABNORMAL LOW (ref 78.0–100.0)
Platelets: 317 10*3/uL (ref 150–400)
RBC: 5.78 MIL/uL — ABNORMAL HIGH (ref 3.87–5.11)
RDW: 14.5 % (ref 11.5–15.5)
WBC: 6.8 10*3/uL (ref 4.0–10.5)

## 2015-10-23 LAB — BASIC METABOLIC PANEL
ANION GAP: 8 (ref 5–15)
BUN: 25 mg/dL — ABNORMAL HIGH (ref 6–20)
CALCIUM: 9.6 mg/dL (ref 8.9–10.3)
CO2: 27 mmol/L (ref 22–32)
Chloride: 101 mmol/L (ref 101–111)
Creatinine, Ser: 1.03 mg/dL — ABNORMAL HIGH (ref 0.44–1.00)
GFR, EST NON AFRICAN AMERICAN: 55 mL/min — AB (ref >60)
Glucose, Bld: 133 mg/dL — ABNORMAL HIGH (ref 65–99)
POTASSIUM: 3.6 mmol/L (ref 3.5–5.1)
SODIUM: 136 mmol/L (ref 135–145)

## 2015-10-23 LAB — TSH: TSH: 0.609 u[IU]/mL (ref 0.350–4.500)

## 2015-10-23 NOTE — Pre-Procedure Instructions (Signed)
    Debra Farrell  10/23/2015       OUTPATIENT PHARMACY - [place],  - [address] [place] [address] Phone: [phone] Fax: [phone]    Your procedure is scheduled on :Wednesday, October 28, 2015  Report to [facility] Admitting at 6:30 A.M.  Call this number if you have problems the morning of surgery:  [phone]   Remember:  Do not eat food or drink liquids after midnight Tuesday, October 27, 2015   Take these medicines the morning of surgery with A SIP OF WATER : none  Stop taking Aspirin, vitamins, fish oil and herbal medications. Do not take any NSAIDs ie: Ibuprofen, Advil, Naproxen or any medication containing Aspirin; stop now.   Do not wear jewelry, make-up or nail polish.  Do not wear lotions, powders, or perfumes.  You may not wear deodorant.  Do not shave 48 hours prior to surgery.    Do not bring valuables to the hospital.  [facility] is not responsible for any belongings or valuables.  Contacts, dentures or bridgework may not be worn into surgery.  Leave your suitcase in the car.  After surgery it may be brought to your room.  For patients admitted to the hospital, discharge time will be determined by your treatment team.  Patients discharged the day of surgery will not be allowed to drive home.   Name and phone number of your driver:   Special instructions: Shower the night before surgery and the morning of surgery with CHG.  Please read over the following fact sheets that you were given. Pain Booklet, Coughing and Deep Breathing and Surgical Site Infection Prevention

## 2015-10-23 NOTE — Progress Notes (Signed)
Pt. Reports that she has never had even an EKG in the past. She denies chest complaints. She is followed by [facility] practice.

## 2015-10-23 NOTE — Telephone Encounter (Signed)
Spoke with patient and she is aware of her appointments °

## 2015-10-23 NOTE — Progress Notes (Signed)
I emailed Ailene Ravel the fmla forms for patient. Dr. Isidore Moos.

## 2015-10-23 NOTE — Progress Notes (Signed)
Paperwork ([employer])received,  given to RN, 10/23/15 Ardeen Fillers)

## 2015-10-23 NOTE — H&P (Signed)
Debra Farrell, Impastato 70 y.o., female [id number]     Chief Complaint: RIGHT neck mass  HPI: 70 year old white female has come in for evaluation of a RIGHT neck mass.  She believes this started when she fell and lacerated her RIGHT pinna 15 months ago.  This did heal nicely.  The mid neck mass has been getting larger and firmer ever since.  No fevers or night sweats.  No change in weight, appetite, or energy.  No other neck masses, axillary or groin masses.  She does not smoke nor drink.  No imaging or treatment thus far.  Workup included negative screen for HIV and hepatitis C.  She has noticed a lesion in the midline behind her lower central incisors which includes some loosening of those teeth.  No breathing,  voice, or swallowing issues.  No prior history of cancer anywhere.  I called to review her pathology.  The needle aspiration of the neck shows predominantly necrotic material with some atypical cells suspicious for squamous cell carcinoma.  The lesion of her lower alveolus is pyogenic granuloma was no evidence of malignancy.  I spoke with Dr. Avis Epley earlier today.  There is not enough material to do P. 16 staining.  I discussed this with the patient.  We have a CT scan of the neck scheduled next week.  We will now also order a PET scan.  She may require panendoscopy with biopsy of the base of tongue.  we will obtain P. 16 staining of this tissue.  she understands and agrees.  I reviewed her CT scan of the neck from yesterday.  She has a large enhancing mass of the RIGHT tongue base.  She has multiple necrotic nodes in the RIGHT neck but not in the LEFT.  She has multinodular enlargement of the RIGHT thyroid with some calcifications.    I will call and discuss this with her.  We will put her on for tumor board next week.  She probably will need a formal biopsy/panendoscopy of the RIGHT tongue base  with P 16 staining.  PET scan is pending.  I will order a thyroid ultrasound and ultrasound-guided  needle aspiration of the RIGHT thyroid.   I called to discuss her CT results.  She has a large RIGHT base of tongue mass, and multiple right-sided nodes.  We will discuss her case at tumor board tomorrow morning.  We will decide if we need additional tissue sampling specifically for P. 16.  She has her PET scan scheduled tomorrow.  She is getting appointments lined up for  medical oncology, radiation oncology, and will need to see Dr. Enrique Sack DDS as well.  I explained to her that we have set her up for thyroid ultrasound, and ultrasound-guided biopsy of the RIGHT thyroid lobe.  This is probably incidental to the base of tongue cancer.  I will discuss further results with her as they become available.  She understands and agrees.  Preoperative visit.  She is undergoing dental extractions next week and at the same time, we will perform panendoscopy and biopsy of the RIGHT tongue base mass.   I reviewed the findings thus far including PET scan positive in the RIGHT base of tongue and lymph nodes in both necks. her thyroid ultrasound showed a multinodular RIGHT lobe.  She is scheduled for needle aspiration in the immediate future.   She claims to be eating well but not gaining any weight.   I discussed my portion of the procedure including risks and  complications.  Questions were answered and informed consent was obtained.  I left her prescriptions for hydrocodone liquid and Peridex oral rinse.  I Discussed resumption of activities and diet.  I will see her back in one month.  I will call her with the pathology reports when available.  PMH: Past Medical History  Diagnosis Date  . Hypertension   . Hyperlipidemia   . Eczema   . Thalassemia minor   . 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 Abdominal hysterectomy  1990    partial  . Incontinence surgery  1990, [id number]  . Rectocele repair    . Urocele      correction surgery     FHx:   Family History  Problem Relation Age of Onset  . Cancer Mother     lung  . Asthma Father    SocHx:  reports that she has never smoked. She has never used smokeless tobacco. She reports that she does not drink alcohol or use illicit drugs.  ALLERGIES:  Allergies  Allergen Reactions  . Morphine And Related Nausea And Vomiting     (Not in a hospital admission)  No results found for this or any previous visit (from the past 48 hour(s)). No results found.  WER:[id number]: Not feeling tired (fatigue).  No fever, no night sweats, and no recent weight loss. Head: No headache. Eyes: No eye symptoms. Otolaryngeal: No hearing loss, no earache, no tinnitus, and no purulent nasal discharge.  No nasal passage blockage (stuffiness), no snoring, no sneezing, no hoarseness, and no sore throat. Cardiovascular: No chest pain or discomfort  and no palpitations. Pulmonary: No dyspnea, no cough, and no wheezing. Gastrointestinal: No dysphagia  and no heartburn.  No nausea, no abdominal pain, and no melena.  No diarrhea. Genitourinary: No dysuria. Endocrine: No muscle weakness. Musculoskeletal: No calf muscle cramps, no arthralgias, and no soft tissue swelling. Neurological: No dizziness, no fainting, no tingling, and no numbness. Psychological: No anxiety  and no depression. Skin: No rash.  BP:148/78,  HR: 81 b/min,  BSA Calculated: 1.90 ,  BMI Calculated: 25.54 ,  Weight: 163.5 lb , BMI: 25.5 kg/m2,  Height: 5 ft 8 in.   PHYSICAL EXAM: She appears trim and basically healthy.  There is a visible mass in the RIGHT neck.  Mental status is sharp.  She hears well in conversational speech.  Voice is clear and respirations unlabored through the nose.  The head is atraumatic and neck supple.  Cranial nerves intact.  Ear canals are clear with normal drums.  Anterior nose is moist and patent.  Oral cavity reveals teeth and fair to poor repair.  There is a roughly 1.5 cm semi-pedunculated mass  behind teeth  #24 and 25  with loosening of those 2 teeth.  No other intraoral lesions.  Oropharynx shows surgically absent tonsils. Neck is remarkable for a firm ovoid 4 x 3 x 3 cm RIGHT level III mass.  No other palpable nodes.   Using the flexible laryngoscope, the nasopharynx is clear with normal eustachian tori.  Oropharynx shows moderately prominent lingual tonsils more to the RIGHT than the LEFT.  Hypopharynx/larynx normal.   On palpation of the base of tongue, there is mild fullness on the RIGHT side but no firmness, pain, or ulceration  Lungs: Clear to auscultation Heart: Regular rate and rhythm Abdomen: Soft, active Extremities: Normal configuration Neurologic: Symmetric, grossly intact.  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 Lymphadenopathy, cervical (785.6) (R59.0). Hypertension (401.9) (I10). 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  All the evidence points to a RIGHT base of tongue cancer with lymph nodes actually in both sides of her neck, but mostly the RIGHT.  A thyroid biopsy is pending but is likely to be benign.  We will check a TSH for thyroid function.    Next week, preceding Dr. Enrique Sack is dental extractions, we will do an evaluation of your throat under anesthesia including a biopsy of the tongue base.  I will call you with the pathology reports.  I would like to see you back to one month after that surgery at which point you should be under treatment.   your hearing testing today did show hearing loss which may be related to distant noise exposure.  Avoid future noise exposure.     I am leaving you prescriptions today for hydrocodone liquid for pain relief, and for  Peridex antibiotic mouth rinse after dental extractions.    Hydrocodone-Acetaminophen 7.5-325 MG/15ML Oral Solution;10-20 ml po q4-6h prn pain; Qty400; R0;  Rx. Chlorhexidine Gluconate 0.12 % Mouth/Throat Solution;5 ml swish/swallow 4 times daily; MDY709; R2; Rx.  Reka Wist 2/95/7473, 9:08 AM

## 2015-10-26 ENCOUNTER — Other Ambulatory Visit: Payer: Self-pay | Admitting: Radiology

## 2015-10-26 ENCOUNTER — Ambulatory Visit (HOSPITAL_COMMUNITY)
Admission: RE | Admit: 2015-10-26 | Discharge: 2015-10-26 | Disposition: A | Discharge status: Home / Self Care | Payer: 59 | Source: Ambulatory Visit | Attending: Otolaryngology | Admitting: Otolaryngology

## 2015-10-26 DIAGNOSIS — Z01812 Encounter for preprocedural laboratory examination: Secondary | ICD-10-CM | POA: Insufficient documentation

## 2015-10-26 DIAGNOSIS — E042 Nontoxic multinodular goiter: Secondary | ICD-10-CM | POA: Diagnosis not present

## 2015-10-26 DIAGNOSIS — Z885 Allergy status to narcotic agent status: Secondary | ICD-10-CM | POA: Insufficient documentation

## 2015-10-26 DIAGNOSIS — I10 Essential (primary) hypertension: Secondary | ICD-10-CM | POA: Diagnosis not present

## 2015-10-26 DIAGNOSIS — E785 Hyperlipidemia, unspecified: Secondary | ICD-10-CM | POA: Diagnosis not present

## 2015-10-26 DIAGNOSIS — D563 Thalassemia minor: Secondary | ICD-10-CM | POA: Diagnosis not present

## 2015-10-26 DIAGNOSIS — E079 Disorder of thyroid, unspecified: Secondary | ICD-10-CM

## 2015-10-26 DIAGNOSIS — C760 Malignant neoplasm of head, face and neck: Secondary | ICD-10-CM | POA: Insufficient documentation

## 2015-10-26 DIAGNOSIS — E041 Nontoxic single thyroid nodule: Secondary | ICD-10-CM | POA: Diagnosis not present

## 2015-10-26 DIAGNOSIS — Z01818 Encounter for other preprocedural examination: Secondary | ICD-10-CM | POA: Diagnosis not present

## 2015-10-26 MED ORDER — LIDOCAINE HCL (PF) 1 % IJ SOLN
INTRAMUSCULAR | Status: AC
  Filled 2015-10-26: qty 10

## 2015-10-26 NOTE — Procedures (Signed)
US guided FNA of right lower pole nodule/pseudonodule.  5 FNAs obtained.  No immediate complication.

## 2015-10-27 ENCOUNTER — Ambulatory Visit (HOSPITAL_COMMUNITY)
Admission: RE | Admit: 2015-10-27 | Discharge: 2015-10-27 | Disposition: A | Discharge status: Home / Self Care | Payer: 59 | Source: Ambulatory Visit | Attending: Hematology and Oncology | Admitting: Hematology and Oncology

## 2015-10-27 ENCOUNTER — Encounter (HOSPITAL_COMMUNITY): Payer: Self-pay

## 2015-10-27 ENCOUNTER — Encounter: Payer: Self-pay | Admitting: Hematology and Oncology

## 2015-10-27 ENCOUNTER — Other Ambulatory Visit: Payer: Self-pay | Admitting: Hematology and Oncology

## 2015-10-27 ENCOUNTER — Encounter: Payer: Self-pay | Admitting: *Deleted

## 2015-10-27 DIAGNOSIS — E785 Hyperlipidemia, unspecified: Secondary | ICD-10-CM | POA: Insufficient documentation

## 2015-10-27 DIAGNOSIS — Z539 Procedure and treatment not carried out, unspecified reason: Secondary | ICD-10-CM | POA: Diagnosis not present

## 2015-10-27 DIAGNOSIS — Z801 Family history of malignant neoplasm of trachea, bronchus and lung: Secondary | ICD-10-CM | POA: Diagnosis not present

## 2015-10-27 DIAGNOSIS — C029 Malignant neoplasm of tongue, unspecified: Secondary | ICD-10-CM | POA: Diagnosis not present

## 2015-10-27 DIAGNOSIS — E042 Nontoxic multinodular goiter: Secondary | ICD-10-CM | POA: Diagnosis not present

## 2015-10-27 DIAGNOSIS — C01 Malignant neoplasm of base of tongue: Secondary | ICD-10-CM | POA: Diagnosis not present

## 2015-10-27 DIAGNOSIS — I10 Essential (primary) hypertension: Secondary | ICD-10-CM | POA: Diagnosis not present

## 2015-10-27 LAB — BASIC METABOLIC PANEL
ANION GAP: 10 (ref 5–15)
BUN: 21 mg/dL — ABNORMAL HIGH (ref 6–20)
CO2: 27 mmol/L (ref 22–32)
Calcium: 9.9 mg/dL (ref 8.9–10.3)
Chloride: 102 mmol/L (ref 101–111)
Creatinine, Ser: 0.85 mg/dL (ref 0.44–1.00)
GFR calc Af Amer: >60 mL/min (ref >60)
GLUCOSE: 107 mg/dL — AB (ref 65–99)
POTASSIUM: 3.7 mmol/L (ref 3.5–5.1)
Sodium: 139 mmol/L (ref 135–145)

## 2015-10-27 LAB — CBC WITH DIFFERENTIAL/PLATELET
BASOS ABS: 0.1 10*3/uL (ref 0.0–0.1)
Basophils Relative: 1 %
Eosinophils Absolute: 0.2 10*3/uL (ref 0.0–0.7)
Eosinophils Relative: 3 %
HEMATOCRIT: 37.3 % (ref 36.0–46.0)
HEMOGLOBIN: 11.6 g/dL — AB (ref 12.0–15.0)
LYMPHS PCT: 22 %
Lymphs Abs: 1.3 10*3/uL (ref 0.7–4.0)
MCH: 19.2 pg — ABNORMAL LOW (ref 26.0–34.0)
MCHC: 31.1 g/dL (ref 30.0–36.0)
MCV: 61.7 fL — ABNORMAL LOW (ref 78.0–100.0)
MONOS PCT: 10 %
Monocytes Absolute: 0.6 10*3/uL (ref 0.1–1.0)
NEUTROS ABS: 3.7 10*3/uL (ref 1.7–7.7)
Neutrophils Relative %: 64 %
Platelets: 288 10*3/uL (ref 150–400)
RBC: 6.05 MIL/uL — AB (ref 3.87–5.11)
RDW: 14.3 % (ref 11.5–15.5)
WBC: 5.9 10*3/uL (ref 4.0–10.5)

## 2015-10-27 LAB — PROTIME-INR
INR: 1.08 (ref 0.00–1.49)
Prothrombin Time: 14.2 seconds (ref 11.6–15.2)

## 2015-10-27 MED ORDER — CEFAZOLIN SODIUM-DEXTROSE 2-3 GM-% IV SOLR
2.0000 g | Freq: Once | INTRAVENOUS | Status: AC
  Administered 2015-10-28: 2 g via INTRAVENOUS
  Filled 2015-10-27: qty 50

## 2015-10-27 MED ORDER — CEFAZOLIN SODIUM-DEXTROSE 2-3 GM-% IV SOLR: 2.0000 g | INTRAVENOUS | Status: DC

## 2015-10-27 MED ORDER — SODIUM CHLORIDE 0.9 % IV SOLN
INTRAVENOUS | Status: DC
  Administered 2015-10-27: 10:00:00 via INTRAVENOUS

## 2015-10-27 NOTE — Progress Notes (Signed)
[employer] noted. fmla for patient and wendi champigny were left in box

## 2015-10-27 NOTE — Anesthesia Preprocedure Evaluation (Addendum)
Anesthesia Evaluation  Patient identified by MRN, date of birth, ID band Patient awake    Reviewed: Allergy & Precautions, NPO status , Patient's Chart, lab work & pertinent test results  History of Anesthesia Complications Negative for: history of anesthetic complications  Airway Mallampati: I  TM Distance: >3 FB Neck ROM: Full    Dental  (+) Dental Advisory Given, Poor Dentition   Pulmonary neg pulmonary ROS,    breath sounds clear to auscultation       Cardiovascular hypertension, Pt. on medications (-) angina Rhythm:Regular Rate:Normal     Neuro/Psych negative neurological ROS     GI/Hepatic Neg liver ROS, GERD  Controlled,  Endo/Other  negative endocrine ROS  Renal/GU negative Renal ROS     Musculoskeletal negative musculoskeletal ROS (+)   Abdominal   Peds  Hematology  (+) Blood dyscrasia (thalasemia minor), ,   Anesthesia Other Findings Cancer of tongue base  Reproductive/Obstetrics                           Anesthesia Physical Anesthesia Plan  ASA: III  Anesthesia Plan: General   Post-op Pain Management:    Induction: Intravenous  Airway Management Planned: Oral ETT and Nasal ETT  Additional Equipment:   Intra-op Plan:   Post-operative Plan: Extubation in OR  Informed Consent: I have reviewed the patients History and Physical, chart, labs and discussed the procedure including the risks, benefits and alternatives for the proposed anesthesia with the patient or authorized representative who has indicated his/her understanding and acceptance.   Dental advisory given  Plan Discussed with: CRNA and Surgeon  Anesthesia Plan Comments: (Plan routine monitors, GETA, beginning with Oral ETT, changing to nasal ETT intra op for dental)        Anesthesia Quick Evaluation

## 2015-10-27 NOTE — H&P (Signed)
Chief Complaint: base of tongue cancer, need PAC and g-tube Referring Physician:Dr. [name] HPI: Debra Farrell is an 70 y.o. 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  She is followed by Dr. Alvy Bimler and a request has been made for a [facility] for chemo and a g-tube.  Of note, she does have thyroid nodules as well that were biopsied by Dr.  Anselm Pancoast yesterday.  Pathology is pending.   Past Medical History:  Past Medical History  Diagnosis Date  . Hypertension   . Hyperlipidemia   . Eczema   . Thalassemia minor   . Cancer of base of tongue ([place]) 10/14/2015  . Laceration 04/2014    around R ear, for falling out of bed & hitting nightstand    Past Surgical History:  Past Surgical History  Procedure Laterality Date  . Abdominal hysterectomy  1990    partial  . Incontinence surgery  1990, [id number]  . Rectocele repair    . Urocele      correction surgery  . Tonsillectomy      as a child    Family History:  Family History  Problem Relation Age of Onset  . Cancer Mother     lung  . Asthma Father     Social History:  reports that she has never smoked. She has never used smokeless tobacco. She reports that she does not drink alcohol or use illicit drugs.  Allergies:  Allergies  Allergen Reactions  . Morphine And Related Nausea And Vomiting    Medications:   Medication List    ASK your doctor about these medications        hydrochlorothiazide 25 MG tablet  Commonly known as:  HYDRODIURIL  Take 25 mg by mouth daily.     ibuprofen 200 MG tablet  Commonly known as:  ADVIL,MOTRIN  Take 400 mg by mouth every 6 (six) hours as needed for moderate pain.        Please HPI for pertinent positives, otherwise complete 10 system ROS negative.  Mallampati Score: MD Evaluation Airway: WNL Heart: WNL Abdomen: WNL Chest/ Lungs: WNL ASA  Classification: 3 Mallampati/Airway Score: One  Physical Exam: BP 155/95 mmHg  Pulse 72  Temp(Src)  98.1 F (36.7 C) (Oral)  Resp 18  Ht '5\' 6"'  (1.676 m)  Wt 161 lb (73.029 kg)  BMI 26.00 kg/m2  SpO2 100% Body mass index is 26 kg/(m^2). General: pleasant, WD, WN white female who is laying in bed in NAD HEENT: head is normocephalic, atraumatic.  Sclera are noninjected.  PERRL.  Ears and nose without any masses or lesions.  Mouth is pink and moist Heart: regular, rate, and rhythm.  Normal s1,s2. No obvious murmurs, gallops, or rubs noted.  Palpable radial and pedal pulses bilaterally Lungs: CTAB, no wheezes, rhonchi, or rales noted.  Respiratory effort nonlabored Abd: soft, NT, ND, +BS, no masses or organomegaly MS: all 4 extremities are symmetrical with no cyanosis, clubbing, or edema. Skin: warm and dry with no masses, lesions, or rashes Psych: A&Ox3 with an appropriate affect.   Labs: Results for orders placed or performed during the hospital encounter of 10/27/15 (from the past 48 hour(s))  Basic metabolic panel     Status: Abnormal   Collection Time: 10/27/15 10:00 AM  Result Value Ref Range   Sodium 139 135 - 145 mmol/L   Potassium 3.7 3.5 - 5.1 mmol/L   Chloride 102 101 - 111 mmol/L  CO2 27 22 - 32 mmol/L   Glucose, Bld 107 (H) 65 - 99 mg/dL   BUN 21 (H) 6 - 20 mg/dL   Creatinine, Ser 0.85 0.44 - 1.00 mg/dL   Calcium 9.9 8.9 - 10.3 mg/dL   GFR calc non Af Amer >60 >60 mL/min   GFR calc Af Amer >60 >60 mL/min    Comment: (NOTE) The eGFR has been calculated using the CKD EPI equation. This calculation has not been validated in all clinical situations. eGFR's persistently <60 mL/min signify possible Chronic Kidney Disease.    Anion gap 10 5 - 15  CBC with Differential/Platelet     Status: Abnormal (Preliminary result)   Collection Time: 10/27/15 10:00 AM  Result Value Ref Range   WBC 5.9 4.0 - 10.5 K/uL   RBC 6.05 (H) 3.87 - 5.11 MIL/uL   Hemoglobin 11.6 (L) 12.0 - 15.0 g/dL   HCT 37.3 36.0 - 46.0 %   MCV 61.7 (L) 78.0 - 100.0 fL   MCH 19.2 (L) 26.0 - 34.0 pg    MCHC 31.1 30.0 - 36.0 g/dL   RDW 14.3 11.5 - 15.5 %   Platelets 288 150 - 400 K/uL   Neutrophils Relative % PENDING %   Neutro Abs PENDING 1.7 - 7.7 K/uL   Band Neutrophils PENDING %   Lymphocytes Relative PENDING %   Lymphs Abs PENDING 0.7 - 4.0 K/uL   Monocytes Relative PENDING %   Monocytes Absolute PENDING 0.1 - 1.0 K/uL   Eosinophils Relative PENDING %   Eosinophils Absolute PENDING 0.0 - 0.7 K/uL   Basophils Relative PENDING %   Basophils Absolute PENDING 0.0 - 0.1 K/uL   WBC Morphology PENDING    RBC Morphology PENDING    Smear Review PENDING    nRBC PENDING 0 /100 WBC   Metamyelocytes Relative PENDING %   Myelocytes PENDING %   Promyelocytes Absolute PENDING %   Blasts PENDING %  Protime-INR     Status: None   Collection Time: 10/27/15 10:00 AM  Result Value Ref Range   Prothrombin Time 14.2 11.6 - 15.2 seconds   INR 1.08 0.00 - 1.49    Imaging: US Biopsy  10/26/2015  INDICATION: 70 year old with head and neck cancer and multiple thyroid nodules. Request for biopsy of the dominant right thyroid nodule. EXAM: ULTRASOUND GUIDED NEEDLE ASPIRATE BIOPSY OF THE THYROID GLAND MEDICATIONS: None. ANESTHESIA/SEDATION: None FLUOROSCOPY TIME:  None COMPLICATIONS: None immediate. PROCEDURE: The thyroid tissue was evaluated with ultrasound. Previously described 2.2 cm dominant lesion along the inferior right lower pole was difficult to identified on today's examination. The thyroid tissue is diffusely heterogeneous and the previously described nodule along the inferior aspect may represent a pseudo nodule but indeterminate. These findings were discussed with Dr. Erik Obey and we elected to proceed with biopsy of the right lower pole region. Informed written consent was obtained from the patient after a thorough discussion of the procedural risks, benefits and alternatives. All questions were addressed. Maximal Sterile Barrier Technique was utilized including caps, mask, sterile gowns, sterile  gloves, sterile drape, hand hygiene and skin antiseptic. A timeout was performed prior to the initiation of the procedure. Ultrasound was performed to localize and mark an adequate site for the biopsy. The patient was then prepped and draped in a normal sterile fashion. Local anesthesia was provided with 1% lidocaine. Using direct ultrasound guidance, 5 passes were made using 25 gauge needles into the nodule within the right lobe of the thyroid. Two of  the specimens were placed in Afirma solution. Ultrasound was used to confirm needle placements on all occasions. Specimens were sent to Pathology for analysis. FINDINGS: Right thyroid lobe is heterogeneous. Difficult to identify a discrete nodule along the inferior right thyroid lobe. The area of concern in the inferior right thyroid lobe was sampled. IMPRESSION: Ultrasound-guided fine-needle aspirations of the right thyroid lower pole region. Electronically Signed   By: Markus Daft M.D.   On: 10/26/2015 14:19    Assessment/Plan 1. Tongue cancer, need for PAC for chemo and gastrostomy tube placement -we will plan on proceeding with PAC and PEG tube placement today by Dr. Vernard Gambles. -her labs have been reviewed and are within normal range. -she will be getting ancef on call to the IR suite -Risks and Benefits of port a cath placement were discussed with the patient including, but not limited to bleeding, infection, pneumothorax, or fibrin sheath development and need for additional procedures. -Risks and Benefits of g-tube placement were discussed with the patient including, but not limited to the need for a barium enema during the procedure, bleeding, infection, peritonitis, or damage to adjacent structures. All of the patient's questions were answered, patient is agreeable to proceed. Consent signed and in chart.    Thank you for this interesting consult.  I greatly enjoyed meeting Debra Farrell and look forward to participating in their care.  A copy of  this report was sent to the requesting provider on this date.  Electronically Signed: Henreitta Cea 10/27/2015, 11:03 AM   I spent a total of  15 Minutes   in face to face in clinical consultation, greater than 50% of which was counseling/coordinating care for tongue cancer, need for PAC and g-tube

## 2015-10-28 ENCOUNTER — Encounter (HOSPITAL_COMMUNITY): Payer: Self-pay | Admitting: *Deleted

## 2015-10-28 ENCOUNTER — Ambulatory Visit (HOSPITAL_COMMUNITY)
Admission: RE | Admit: 2015-10-28 | Discharge: 2015-10-28 | Disposition: A | Discharge status: Home / Self Care | Payer: 59 | Source: Ambulatory Visit | Attending: Otolaryngology | Admitting: Otolaryngology

## 2015-10-28 ENCOUNTER — Encounter (HOSPITAL_COMMUNITY)
Admission: RE | Disposition: A | Discharge status: Home / Self Care | Payer: Self-pay | Source: Ambulatory Visit | Attending: Otolaryngology

## 2015-10-28 ENCOUNTER — Ambulatory Visit (HOSPITAL_COMMUNITY): Payer: 59 | Admitting: Anesthesiology

## 2015-10-28 ENCOUNTER — Encounter: Payer: Self-pay | Admitting: Hematology and Oncology

## 2015-10-28 DIAGNOSIS — E785 Hyperlipidemia, unspecified: Secondary | ICD-10-CM | POA: Insufficient documentation

## 2015-10-28 DIAGNOSIS — M270 Developmental disorders of jaws: Secondary | ICD-10-CM | POA: Diagnosis not present

## 2015-10-28 DIAGNOSIS — I10 Essential (primary) hypertension: Secondary | ICD-10-CM | POA: Diagnosis not present

## 2015-10-28 DIAGNOSIS — K045 Chronic apical periodontitis: Secondary | ICD-10-CM | POA: Insufficient documentation

## 2015-10-28 DIAGNOSIS — C01 Malignant neoplasm of base of tongue: Secondary | ICD-10-CM | POA: Insufficient documentation

## 2015-10-28 DIAGNOSIS — K083 Retained dental root: Secondary | ICD-10-CM | POA: Diagnosis not present

## 2015-10-28 DIAGNOSIS — E0789 Other specified disorders of thyroid: Secondary | ICD-10-CM | POA: Diagnosis not present

## 2015-10-28 DIAGNOSIS — K0889 Other specified disorders of teeth and supporting structures: Secondary | ICD-10-CM | POA: Insufficient documentation

## 2015-10-28 DIAGNOSIS — R221 Localized swelling, mass and lump, neck: Secondary | ICD-10-CM | POA: Diagnosis present

## 2015-10-28 DIAGNOSIS — K0530 Chronic periodontitis, unspecified: Secondary | ICD-10-CM

## 2015-10-28 DIAGNOSIS — D0007 Carcinoma in situ of tongue: Secondary | ICD-10-CM | POA: Diagnosis not present

## 2015-10-28 DIAGNOSIS — C799 Secondary malignant neoplasm of unspecified site: Secondary | ICD-10-CM | POA: Diagnosis not present

## 2015-10-28 HISTORY — PX: LARYNGOSCOPY AND BRONCHOSCOPY: SHX5659

## 2015-10-28 HISTORY — PX: DIRECT LARYNGOSCOPY: SHX5326

## 2015-10-28 HISTORY — PX: ESOPHAGOSCOPY: SHX5534

## 2015-10-28 HISTORY — PX: MULTIPLE EXTRACTIONS WITH ALVEOLOPLASTY: SHX5342

## 2015-10-28 SURGERY — LARYNGOSCOPY, DIRECT
Anesthesia: General

## 2015-10-28 MED ORDER — DEXAMETHASONE SODIUM PHOSPHATE 10 MG/ML IJ SOLN
INTRAMUSCULAR | Status: DC | PRN
  Administered 2015-10-28: 10 mg via INTRAVENOUS

## 2015-10-28 MED ORDER — BUPIVACAINE-EPINEPHRINE 0.5% -1:200000 IJ SOLN
INTRAMUSCULAR | Status: DC | PRN
  Administered 2015-10-28: 3.6 mL

## 2015-10-28 MED ORDER — MEPERIDINE HCL 25 MG/ML IJ SOLN: 6.2500 mg | INTRAMUSCULAR | Status: DC | PRN

## 2015-10-28 MED ORDER — FENTANYL CITRATE (PF) 100 MCG/2ML IJ SOLN
INTRAMUSCULAR | Status: DC | PRN
  Administered 2015-10-28 (×5): 50 ug via INTRAVENOUS

## 2015-10-28 MED ORDER — DEXTROSE 5 % IV SOLN
10.0000 mg | INTRAVENOUS | Status: DC | PRN
  Administered 2015-10-28: 25 ug/min via INTRAVENOUS

## 2015-10-28 MED ORDER — SUCCINYLCHOLINE CHLORIDE 20 MG/ML IJ SOLN
INTRAMUSCULAR | Status: AC
  Filled 2015-10-28: qty 1

## 2015-10-28 MED ORDER — DEXAMETHASONE SODIUM PHOSPHATE 10 MG/ML IJ SOLN
INTRAMUSCULAR | Status: AC
  Filled 2015-10-28: qty 1

## 2015-10-28 MED ORDER — LIDOCAINE HCL (CARDIAC) 20 MG/ML IV SOLN
INTRAVENOUS | Status: AC
  Filled 2015-10-28: qty 5

## 2015-10-28 MED ORDER — DEXTROSE-NACL 5-0.45 % IV SOLN: INTRAVENOUS | Status: DC

## 2015-10-28 MED ORDER — 0.9 % SODIUM CHLORIDE (POUR BTL) OPTIME
TOPICAL | Status: DC | PRN
  Administered 2015-10-28: 1000 mL

## 2015-10-28 MED ORDER — LIDOCAINE-EPINEPHRINE 2 %-1:100000 IJ SOLN
INTRAMUSCULAR | Status: DC | PRN
  Administered 2015-10-28: 10.2 mL

## 2015-10-28 MED ORDER — NEOSTIGMINE METHYLSULFATE 10 MG/10ML IV SOLN
INTRAVENOUS | Status: AC
  Filled 2015-10-28: qty 1

## 2015-10-28 MED ORDER — GLYCOPYRROLATE 0.2 MG/ML IJ SOLN
INTRAMUSCULAR | Status: AC
  Filled 2015-10-28: qty 2

## 2015-10-28 MED ORDER — MIDAZOLAM HCL 2 MG/2ML IJ SOLN: 0.5000 mg | Freq: Once | INTRAMUSCULAR | Status: DC | PRN

## 2015-10-28 MED ORDER — ONDANSETRON HCL 4 MG/2ML IJ SOLN
INTRAMUSCULAR | Status: AC
  Filled 2015-10-28: qty 2

## 2015-10-28 MED ORDER — BUPIVACAINE-EPINEPHRINE (PF) 0.5% -1:200000 IJ SOLN
INTRAMUSCULAR | Status: AC
  Filled 2015-10-28: qty 1.8

## 2015-10-28 MED ORDER — LIDOCAINE-EPINEPHRINE 2 %-1:100000 IJ SOLN
INTRAMUSCULAR | Status: AC
  Filled 2015-10-28: qty 1.7

## 2015-10-28 MED ORDER — FENTANYL CITRATE (PF) 250 MCG/5ML IJ SOLN
INTRAMUSCULAR | Status: AC
  Filled 2015-10-28: qty 5

## 2015-10-28 MED ORDER — FENTANYL CITRATE (PF) 100 MCG/2ML IJ SOLN: 25.0000 ug | INTRAMUSCULAR | Status: DC | PRN

## 2015-10-28 MED ORDER — MIDAZOLAM HCL 2 MG/2ML IJ SOLN
INTRAMUSCULAR | Status: AC
  Filled 2015-10-28: qty 2

## 2015-10-28 MED ORDER — GLYCOPYRROLATE 0.2 MG/ML IJ SOLN
INTRAMUSCULAR | Status: DC | PRN
  Administered 2015-10-28: 0.4 mg via INTRAVENOUS

## 2015-10-28 MED ORDER — LACTATED RINGERS IV SOLN
INTRAVENOUS | Status: DC | PRN
  Administered 2015-10-28 (×2): via INTRAVENOUS

## 2015-10-28 MED ORDER — NEOSTIGMINE METHYLSULFATE 10 MG/10ML IV SOLN
INTRAVENOUS | Status: DC | PRN
  Administered 2015-10-28: 3 mg via INTRAVENOUS

## 2015-10-28 MED ORDER — PROMETHAZINE HCL 25 MG/ML IJ SOLN: 6.2500 mg | INTRAMUSCULAR | Status: DC | PRN

## 2015-10-28 MED ORDER — PROPOFOL 10 MG/ML IV BOLUS
INTRAVENOUS | Status: AC
  Filled 2015-10-28: qty 20

## 2015-10-28 MED ORDER — ROCURONIUM BROMIDE 50 MG/5ML IV SOLN
INTRAVENOUS | Status: AC
  Filled 2015-10-28: qty 1

## 2015-10-28 MED ORDER — OXYMETAZOLINE HCL 0.05 % NA SOLN
NASAL | Status: AC
  Filled 2015-10-28: qty 15

## 2015-10-28 MED ORDER — PHENYLEPHRINE HCL 10 MG/ML IJ SOLN
INTRAMUSCULAR | Status: DC | PRN
  Administered 2015-10-28 (×3): 80 ug via INTRAVENOUS

## 2015-10-28 MED ORDER — MIDAZOLAM HCL 5 MG/5ML IJ SOLN
INTRAMUSCULAR | Status: DC | PRN
  Administered 2015-10-28 (×2): 1 mg via INTRAVENOUS

## 2015-10-28 MED ORDER — IBUPROFEN 100 MG/5ML PO SUSP: 400.0000 mg | Freq: Four times a day (QID) | ORAL | Status: DC | PRN

## 2015-10-28 MED ORDER — ROCURONIUM BROMIDE 100 MG/10ML IV SOLN
INTRAVENOUS | Status: DC | PRN
  Administered 2015-10-28: 40 mg via INTRAVENOUS

## 2015-10-28 MED ORDER — EPINEPHRINE HCL (NASAL) 0.1 % NA SOLN
NASAL | Status: AC
  Filled 2015-10-28: qty 30

## 2015-10-28 MED ORDER — PHENYLEPHRINE 40 MCG/ML (10ML) SYRINGE FOR IV PUSH (FOR BLOOD PRESSURE SUPPORT)
PREFILLED_SYRINGE | INTRAVENOUS | Status: AC
  Filled 2015-10-28: qty 10

## 2015-10-28 MED ORDER — HYDROCODONE-ACETAMINOPHEN 7.5-325 MG/15ML PO SOLN: 10.0000 mL | ORAL | Status: DC | PRN

## 2015-10-28 MED ORDER — PROPOFOL 10 MG/ML IV BOLUS
INTRAVENOUS | Status: DC | PRN
  Administered 2015-10-28: 150 mg via INTRAVENOUS
  Administered 2015-10-28: 50 mg via INTRAVENOUS

## 2015-10-28 MED ORDER — LIDOCAINE HCL (CARDIAC) 20 MG/ML IV SOLN
INTRAVENOUS | Status: DC | PRN
  Administered 2015-10-28: 30 mg via INTRAVENOUS

## 2015-10-28 MED ORDER — EPHEDRINE SULFATE 50 MG/ML IJ SOLN
INTRAMUSCULAR | Status: AC
  Filled 2015-10-28: qty 1

## 2015-10-28 MED ORDER — CHLORHEXIDINE GLUCONATE 0.12 % MT SOLN
5.0000 mL | Freq: Four times a day (QID) | OROMUCOSAL | Status: DC
  Filled 2015-10-28: qty 15

## 2015-10-28 MED ORDER — ONDANSETRON HCL 4 MG/2ML IJ SOLN
INTRAMUSCULAR | Status: DC | PRN
  Administered 2015-10-28: 4 mg via INTRAVENOUS

## 2015-10-28 MED ORDER — HEMOSTATIC AGENTS (NO CHARGE) OPTIME
TOPICAL | Status: DC | PRN
  Administered 2015-10-28: 1 via TOPICAL

## 2015-10-28 MED ORDER — SODIUM CHLORIDE 0.9 % IJ SOLN
INTRAMUSCULAR | Status: AC
  Filled 2015-10-28: qty 10

## 2015-10-28 SURGICAL SUPPLY — 52 items
ALCOHOL 70% 16 OZ (MISCELLANEOUS) ×2 IMPLANT
ATTRACTOMAT 16X20 MAGNETIC DRP (DRAPES) ×2 IMPLANT
BLADE SURG 15 STRL LF DISP TIS (BLADE) ×2 IMPLANT
BLADE SURG 15 STRL SS (BLADE) ×6
CONT SPEC 4OZ CLIKSEAL STRL BL (MISCELLANEOUS) ×2 IMPLANT
COUNTER NEEDLE 20 DBL MAG RED (NEEDLE) ×1 IMPLANT
COVER BACK TABLE 60X90IN (DRAPES) ×1 IMPLANT
COVER MAYO STAND STRL (DRAPES) ×3 IMPLANT
COVER SURGICAL LIGHT HANDLE (MISCELLANEOUS) ×2 IMPLANT
COVER TABLE BACK 60X90 (DRAPES) ×2 IMPLANT
DRAPE PROXIMA HALF (DRAPES) ×2 IMPLANT
GAUZE PACKING FOLDED 2  STR (GAUZE/BANDAGES/DRESSINGS) ×1
GAUZE PACKING FOLDED 2 STR (GAUZE/BANDAGES/DRESSINGS) ×1 IMPLANT
GAUZE SPONGE 4X4 12PLY STRL (GAUZE/BANDAGES/DRESSINGS) ×1 IMPLANT
GAUZE SPONGE 4X4 16PLY XRAY LF (GAUZE/BANDAGES/DRESSINGS) ×4 IMPLANT
GLOVE BIOGEL PI IND STRL 6 (GLOVE) ×1 IMPLANT
GLOVE BIOGEL PI IND STRL 7.0 (GLOVE) IMPLANT
GLOVE BIOGEL PI INDICATOR 6 (GLOVE) ×1
GLOVE BIOGEL PI INDICATOR 7.0 (GLOVE) ×2
GLOVE ECLIPSE 8.0 STRL XLNG CF (GLOVE) ×2 IMPLANT
GLOVE SURG ORTHO 8.0 STRL STRW (GLOVE) ×2 IMPLANT
GLOVE SURG SS PI 6.0 STRL IVOR (GLOVE) ×2 IMPLANT
GLOVE SURG SS PI 7.0 STRL IVOR (GLOVE) ×3 IMPLANT
GOWN STRL REUS W/ TWL LRG LVL3 (GOWN DISPOSABLE) ×1 IMPLANT
GOWN STRL REUS W/ TWL XL LVL3 (GOWN DISPOSABLE) IMPLANT
GOWN STRL REUS W/TWL 2XL LVL3 (GOWN DISPOSABLE) ×2 IMPLANT
GOWN STRL REUS W/TWL LRG LVL3 (GOWN DISPOSABLE) ×6
GOWN STRL REUS W/TWL XL LVL3 (GOWN DISPOSABLE) ×2
GUARD TEETH (MISCELLANEOUS) ×2 IMPLANT
KIT BASIN OR (CUSTOM PROCEDURE TRAY) ×3 IMPLANT
KIT ROOM TURNOVER OR (KITS) ×2 IMPLANT
MANIFOLD NEPTUNE WASTE (CANNULA) ×2 IMPLANT
MARKER SKIN DUAL TIP RULER LAB (MISCELLANEOUS) ×1 IMPLANT
NDL BLUNT 16X1.5 OR ONLY (NEEDLE) ×1 IMPLANT
NEEDLE BLUNT 16X1.5 OR ONLY (NEEDLE) ×2 IMPLANT
NS IRRIG 1000ML POUR BTL (IV SOLUTION) ×4 IMPLANT
PACK EENT II TURBAN DRAPE (CUSTOM PROCEDURE TRAY) ×2 IMPLANT
PAD ARMBOARD 7.5X6 YLW CONV (MISCELLANEOUS) ×2 IMPLANT
SOLUTION ANTI FOG 6CC (MISCELLANEOUS) ×1 IMPLANT
SPONGE GAUZE 4X4 12PLY STER LF (GAUZE/BANDAGES/DRESSINGS) ×4 IMPLANT
SPONGE SURGIFOAM ABS GEL 100 (HEMOSTASIS) IMPLANT
SPONGE SURGIFOAM ABS GEL 12-7 (HEMOSTASIS) IMPLANT
SPONGE SURGIFOAM ABS GEL SZ50 (HEMOSTASIS) ×1 IMPLANT
SUCTION FRAZIER HANDLE 10FR (MISCELLANEOUS) ×1
SUCTION TUBE FRAZIER 10FR DISP (MISCELLANEOUS) ×1 IMPLANT
SUT CHROMIC 3 0 PS 2 (SUTURE) ×6 IMPLANT
SYR 50ML SLIP (SYRINGE) ×2 IMPLANT
SYR BULB IRRIGATION 50ML (SYRINGE) ×1 IMPLANT
TOWEL OR 17X24 6PK STRL BLUE (TOWEL DISPOSABLE) ×2 IMPLANT
TOWEL OR 17X26 10 PK STRL BLUE (TOWEL DISPOSABLE) ×2 IMPLANT
TUBE CONNECTING 12X1/4 (SUCTIONS) ×4 IMPLANT
YANKAUER SUCT BULB TIP NO VENT (SUCTIONS) ×2 IMPLANT

## 2015-10-28 NOTE — Anesthesia Procedure Notes (Signed)
Procedure Name: Intubation Date/Time: 10/28/2015 9:19 AM Performed by: Rebekah Chesterfield L Pre-anesthesia Checklist: Patient identified, Emergency Drugs available, Suction available, Patient being monitored and Timeout performed Patient Re-evaluated:Patient Re-evaluated prior to inductionOxygen Delivery Method: Circle system utilized Preoxygenation: Pre-oxygenation with 100% oxygen Intubation Type: Inhalational induction with existing ETT Ventilation: Mask ventilation without difficulty Laryngoscope Size: Mac and 3 Grade View: Grade I Nasal Tubes: Right and Nasal prep performed Tube size: 7.5 mm Number of attempts: 1 Placement Confirmation: ETT inserted through vocal cords under direct vision,  positive ETCO2 and breath sounds checked- equal and bilateral Secured at: 29 cm Tube secured with: Tape Dental Injury: Bloody posterior oropharynx  Comments: Pt originally intubated by ENT p direct laryngoscopy =BBS +etco2 7.5 ett, p ENT procedure 8.0 npa placed right nare dl with mac #3 nasal rae tube passed s difficulty visualized through cords without use of magills . Oropharynx bloody p bx and multiple DL's by ENT

## 2015-10-28 NOTE — Op Note (Signed)
10/28/2015  9:22 AM    Priscella Mann  [id number]   Pre-Op Dx:  T2N3 SCCa RIGHT base of tongue  Post-op Dx: same  Proc: DL and Bx, Esophagoscopy, Bronchoscopy   Surg:  [name] T MD  Anes: GMask to  GOT  EBL:  min  Comp:  none  Findings:  Friable RIGHT tongue base mass, 3cm.  Nl laryngoscopy, bronchoscopy, esophagoscopy   Procedure: With the patient in a comfortable supine position, GMask anesthesia was induced without difficulty.  At an appropriate level, the table was turned 90 degrees away from Anesthesia.  A clean preparation and draping was performed in the standard fashion.  A rubber tooth guard was placed.   Using the [facility] laryngoscope, the larynx was visualized.  The rod bronchoscope was passed  and inspection down into the mainstem bronchus on each side was performed with the findings as described above.  The bronchoscope and laryngoscope were removed.  The cervical esopagoscope was lubricated and inserted into the hypopharynx.  With gentle pressure, it was passed through the cricopharyngeus and advanced to its full length without difficulty with the findings as described above.  It was removed.  The anterior commissure laryngoscope was introduced taking care to protect lips, teeth, and endotracheal tube.  Complete laryngoscopy was performed in the standard fashion.  The findings were as described above. Cup forcep biopsies from the RIGHT base of tongue lesions were taken.  Hemostasis was spontaneous.   The laryngoscope was removed.  The oropharynx, oral cavity, nasopharynx and hypopharynx were palpated with findings as described above.  At this point the procedure was completed. The patient was turned over to Dr. Enrique Sack for full mouth dental extractions.   Dispo:   Per Dr. Enrique Sack  Plan:  Ice, elevation, oral hygiene, analgesia.  Await pathology reports. Diet per Dr. Hart Robinsons,  Marikay Alar MD

## 2015-10-28 NOTE — Discharge Instructions (Signed)
Rinse mouth with cool dilute salt water after meals and as desired Diet as per Dr. Enrique Sack ( Advance diet as tolerated to soft food diet) Recheck my office 3 weeks [phone]   MOUTH CARE AFTER SURGERY  FACTS:  Ice used in ice bag helps keep the swelling down, and can help lessen the pain.  It is easier to treat pain BEFORE it happens.  Spitting disturbs the clot and may cause bleeding to start again, or to get worse.  Smoking delays healing and can cause complications.  Sharing prescriptions can be dangerous.  Do not take medications not recently prescribed for you.  Antibiotics may stop birth control pills from working.  Use other means of birth control while on antibiotics.  Warm salt water rinses after the first 24 hours will help lessen the swelling:  Use 1/2 teaspoonful of table salt per oz.of water.  DO NOT:  Do not spit.  Do not drink through a straw.  Strongly advised not to smoke, dip snuff or chew tobacco at least for 3 days.  Do not eat sharp or crunchy foods.  Avoid the area of surgery when chewing.  Do not stop your antibiotics before your instructions say to do so.  Do not eat hot foods until bleeding has stopped.  If you need to, let your food cool down to room temperature.  EXPECT:  Some swelling, especially first 2-3 days.  Soreness or discomfort in varying degrees.  Follow your dentist's instructions about how to handle pain before it starts.  Pinkish saliva or light blood in saliva, or on your pillow in the morning.  This can last around 24 hours.  Bruising inside or outside the mouth.  This may not show up until 2-3 days after surgery.  Don't worry, it will go away in time.  Pieces of "bone" may work themselves loose.  It's OK.  If they bother you, let us know.  WHAT TO DO IMMEDIATELY AFTER SURGERY:  Bite on the gauze with steady pressure for 1-2 hours.  Don't chew on the gauze.  Do not lie down flat.  Raise your head support especially for  the first 24 hours.  Apply ice to your face on the side of the surgery.  You may apply it 20 minutes on and a few minutes off.  Ice for 8-12 hours.  You may use ice up to 24 hours.  Before the numbness wears off, take a pain pill as instructed.  Prescription pain medication is not always required.  SWELLING:  Expect swelling for the first couple of days.  It should get better after that.  If swelling increases 3 days or so after surgery; let us know as soon as possible.  FEVER:  Take Tylenol every 4 hours if needed to lower your temperature, especially if it is at 100F or higher.  Drink lots of fluids.  If the fever does not go away, let us know.  BREATHING TROUBLE:  Any unusual difficulty breathing means you have to have someone bring you to the emergency room ASAP  BLEEDING:  Light oozing is expected for 24 hours or so.  Prop head up with pillows  Avoid spitting  Do not confuse bright red fresh flowing blood with lots of saliva colored with a little bit of blood.  If you notice some bleeding, place gauze or a tea bag where it is bleeding and apply CONSTANT pressure by biting down for 1 hour.  Avoid talking during this time.  Do  not remove the gauze or tea bag during this hour to "check" the bleeding.  If you notice bright RED bleeding FLOWING out of particular area, and filling the floor of your mouth, put a wad of gauze on that area, bite down firmly and constantly.  Call us immediately.  If we're closed, have someone bring you to the emergency room.  ORAL HYGIENE:  Brush your teeth as usual after meals and before bedtime.  Use a soft toothbrush around the area of surgery.  DO NOT AVOID BRUSHING.  Otherwise bacteria(germs) will grow and may delay healing or encourage infection.  Since you cannot spit, just gently rinse and let the water flow out of your mouth.  DO NOT SWISH HARD.  EATING:  Cool liquids are a good point to start.  Increase to soft foods as  tolerated.  PRESCRIPTIONS:  Follow the directions for your prescriptions exactly as written.  If Dr. Enrique Sack gave you a narcotic pain medication, do not drive, operate machinery or drink alcohol when on that medication.  QUESTIONS:  Call our office during office hours [phone] or call the Emergency Room at [phone].

## 2015-10-28 NOTE — Progress Notes (Signed)
I called and left patient message forms are being faxed to [phone] and will send her copy for records along with wendi's.

## 2015-10-28 NOTE — Progress Notes (Signed)
PRE-OPERATIVE NOTE:  10/28/2015 Lolly Mustache Beaver [id number]  VITALS: BP 161/75 mmHg  Pulse 68  Temp(Src) 99 F (37.2 C) (Oral)  Resp 16  Ht '5\' 6"'$  (1.676 m)  Wt 161 lb (73.029 kg)  BMI 26.00 kg/m2  SpO2 99%  Lab Results  Component Value Date   WBC 5.9 10/27/2015   HGB 11.6* 10/27/2015   HCT 37.3 10/27/2015   MCV 61.7* 10/27/2015   PLT 288 10/27/2015   BMET    Component Value Date/Time   NA 139 10/27/2015 1000   K 3.7 10/27/2015 1000   CL 102 10/27/2015 1000   CO2 27 10/27/2015 1000   GLUCOSE 107* 10/27/2015 1000   BUN 21* 10/27/2015 1000   CREATININE 0.85 10/27/2015 1000   CREATININE 1.04 04/07/2015 1536   CALCIUM 9.9 10/27/2015 1000   GFRNONAA >60 10/27/2015 1000   GFRAA >60 10/27/2015 1000    Lab Results  Component Value Date   INR 1.08 10/27/2015   No results found for: PTT   Urijah E Sanders presents for extraction of remaining teeth with alveoloplasty and pre-prosthetic surgery in the operating room with general anesthesia.   SUBJECTIVE: The patient denies any acute medical or dental changes and agrees to proceed with treatment as planned.  EXAM: No sign of acute dental changes.  ASSESSMENT: Patient is affected by history of acute pulpitis, chronic periodontitis, chronic apical periodontititis, retained roots, loose teeth, and mandibular torus.  PLAN: Patient agrees to proceed with treatment as planned in the operating room as previously discussed and accepts the risks, benefits, and complications of the proposed treatment. Patient is aware of the risk for bleeding, bruising, swelling, infection, pain, nerve damage, soft tissue damage, sinus involvement, root tip fracture, mandible fracture, and the risks of complications associated with the anesthesia. Patient also is aware of the potential for other complications not mentioned above.   Lenn Cal, DDS

## 2015-10-28 NOTE — Interval H&P Note (Signed)
History and Physical Interval Note:  10/28/2015 8:35 AM  Debra Farrell  has presented today for surgery, with the diagnosis of RIGHT TONGUE BASE MASS  The various methods of treatment have been discussed with the patient and family. After consideration of risks, benefits and other options for treatment, the patient has consented to  Procedure(s): DIRECT LARYNGOSCOPY WITH BIOPSY, FROZEN SECTION (N/A) ESOPHAGOSCOPY (N/A) BRONCHOSCOPY (N/A) MULTIPLE EXTRACTION WITH ALVEOLOPLASTY AND PRE-PROSTHETIC SURGERY AS NEEDED (N/A) as a surgical intervention .  The patient's history has been re-reviewed, patient re-examined, no change in status, stable for surgery.  I have re-reviewed the patient's chart and labs.  Questions were answered to the patient's satisfaction.     [name]

## 2015-10-28 NOTE — Transfer of Care (Signed)
Immediate Anesthesia Transfer of Care Note  Patient: Debra Farrell  Procedure(s) Performed: Procedure(s): DIRECT LARYNGOSCOPY WITH BIOPSY (N/A) ESOPHAGOSCOPY (N/A) BRONCHOSCOPY (N/A) Extraction of tooth #'s 2-12, 14,15,17,18,20-29, 31 with alveoloplasy and mandibular left torus reduction (N/A)  Patient Location: PACU  Anesthesia Type:General  Level of Consciousness: awake, alert , oriented and patient cooperative  Airway & Oxygen Therapy: Patient Spontanous Breathing and Patient connected to face mask oxygen  Post-op Assessment: Report given to RN, Post -op Vital signs reviewed and stable and Patient moving all extremities  Post vital signs: Reviewed and stable  Last Vitals:  Filed Vitals:   10/28/15 0639 10/28/15 1139  BP: 161/75 121/79  Pulse: 68   Temp: 37.2 C 37.3 C  Resp: 16 7    Complications: No apparent anesthesia complications

## 2015-10-28 NOTE — Op Note (Signed)
OPERATIVE REPORT  Patient:            Debra Farrell Date of Birth:  [birth year] MRN:                [id number]   DATE OF PROCEDURE:  10/28/2015  PREOPERATIVE DIAGNOSES: 1. Squamous cell carcinoma of the base of tongue 2. Pre-chemoradiation therapy dental protocol 3. Chronic apical periodontitis 4. Retained root segments 5. Chronic periodontitis 6. Loose teeth 7. Mandibular left lingual torus  POSTOPERATIVE DIAGNOSES: 1. Squamous cell carcinoma of the base of tongue 2. Pre-chemoradiation therapy dental protocol 3. Chronic apical periodontitis 4. Retained root segments 5. Chronic periodontitis 6. Loose teeth 7. Mandibular left lingual torus  OPERATIONS: 1. Multiple extraction of tooth numbers 2-12, 14, 15, 17, 18,20-29, and 31. 2. 4 Quadrants of alveoloplasty 3. Mandibular left lingual torus reduction   SURGEON: Charlynne Pander, DDS  ANESTHESIA: General anesthesia via nasoendotracheal tube.  MEDICATIONS: 1. Ancef 2 g IV prior to operating room procedures.  2. Local anesthesia with a total utilization of 6 carpules each containing 34 mg of lidocaine with 0.017 mg of epinephrine as well as 2 carpules each containing 9 mg of bupivacaine with 0.009 mg of epinephrine.  SPECIMENS: There are 26 teeth that were discarded.  DRAINS: None  CULTURES: None  COMPLICATIONS: None   ESTIMATED BLOOD LOSS: 100 mLs.  INTRAVENOUS FLUIDS: 1100 mLs of Lactated ringers solution.  INDICATIONS: The patient was recently diagnosed with squamous cell carcinoma of the base of tongue.  A medically necessary dental consultation was then requested as part of a pre-chemoradiation therapy dental protocol.  The patient was examined and treatment planned for extraction of remaining teeth with alveoloplasty and pre-prosthetic surgery as needed in the operating with general anesthesia.  This treatment plan was formulated to decrease the risks and complications associated with dental infection from  affecting the patient's systemic health and to prevent future complications such as infection and osteoradionecrosis.  OPERATIVE FINDINGS: Patient was examined operating room number 8.  The teeth were identified for extraction. The patient was noted be affected by chronic periodontitis, chronic apical periodontitis, retained root segments, loose teeth, history of acute pulpitis, and mandibular left lingual torus.   DESCRIPTION OF PROCEDURE: Patient was brought to the main operating room number 8. Patient was then placed in the supine position on the operating table. Dr. Lazarus Salines then performed his schedule procedures. After completion of his procedures, the patient was handed over to the dental medicine team. General anesthesia with a nasoendotracheal tube was then induced per the anesthesia team. The patient was then prepped and draped in the usual manner for dental medicine procedure. A timeout was performed. The patient was identified and procedures were verified. A throat pack was placed at this time. The oral cavity was then thoroughly examined with the findings noted above. The patient was then ready for dental medicine procedure as follows:  Local anesthesia was then administered sequentially with a total utilization of 6 carpules each containing 34 mg of lidocaine with 0.017 mg of epinephrine as well as 2 carpules  each containing 9 mg bupivacaine with 0.009 mg of epinephrine.  The Maxillary left and right quadrants first approached. Anesthesia was then delivered utilizing infiltration with lidocaine with epinephrine. A #15 blade incision was then made from the maxillary right tuberosity and extended to the maxillary left tuberosity.  A  surgical flap was then carefully reflected. The teeth were then subluxated with a series of straight elevators. Tooth numbers 2,  3 were then removed with a 53R forceps without complications. Tooth numbers 4-11 were then removed with a 150 forceps without  complications. The bridge from tooth numbers 12-14 was then removed with a rongeurs without complication. Tooth #12 was then removed with a 150 forceps without complications. Tooth numbers 14 and 15 were then removed with a 53L forceps without complications.  Alveoloplasty was then performed utilizing a ronguers and bone file. The surgical site was then irrigated with copious amounts of sterile saline. The tissues were approximated and trimmed appropriately.  A piece of Surgifoam was then placed in the extraction socket area #12 through 14. The maxillary right surgical site was then closed from the maxillary right tuberosity and extended the mesial #8 utilizing 3-0 chromic gut suture in a continuous interrupted suture technique 1. The maxillary left surgical site was then closed from the maxillary left tuberosity and extended the mesial #9 utilizing 3-0 chromic gut suture in a continuous interrupted suture technique 1.  At this point time, the mandibular quadrants were approached. The patient was given bilateral inferior alveolar nerve blocks and long buccal nerve blocks utilizing the bupivacaine with epinephrine. Further infiltration was then achieved utilizing the lidocaine with epinephrine. A 15 blade incision was then made from the distal of number 17 and extended to the distal of #32.  A surgical flap was then carefully reflected. The lower teeth were then subluxated with a series of straight elevators. Tooth numbers 17, 18, 20-29 and 31 were then removed utilizing a 151 forceps without complications. Alveoloplasty was then performed utilizing a rongeurs and bone file.  The mandibular left lingual torus was then visualized and reduced utilizing a surgical handpiece and bur and copious amounts of sterile water.  Further alveoloplasty was then performed utilizing a rongeur and bone file. The soft tissue lesion previously biopsied and diagnosed as a a pyogenic granulomas was then removed in the area of soft  tissue #24/25 without complication. This was not sent to pathology for further evaluation per discussion and agreement with Dr. Lazarus Salines. The tissues were approximated and trimmed appropriately. The surgical sites were then irrigated with copious amounts of sterile saline 4. The mandibular left surgical site was then closed from the distal of  #17 to 24 utilizing 3-0 chromic gut suture in a continuous interrupted suture technique 1.  The mandibular right surgical site was then closed from the distal of #32 and extended the mesial of #25 utilizing 3-0 chromic gut suture in a continuous surface suture technique 1. 2 individual interrupted sutures were then placed to further close the surgical site as needed.   At this point time, the entire mouth was irrigated with copious amounts of sterile saline. The patient was examined for complications, seeing none, the dental medicine procedure was deemed to be complete. The throat pack was removed at this time. An oral airway was then placed at the request of the anesthesia team. A series of 4 x 4 gauze were placed in the mouth to aid hemostasis. The patient was then handed over to the anesthesia team for final disposition. After an appropriate amount of time, the patient was extubated and taken to the postanesthsia care unit in good condition. All counts were correct for the dental medicine procedure. Patient has already been prescribed hydrocodone/acetaminophen liquid pain medication and chlorhexidine rinses for postop use by Dr. Lazarus Salines. Patient is to be seen in approximately 7-10 days for evaluation for suture removal.   Charlynne Pander, DDS.

## 2015-10-28 NOTE — H&P (View-Only) (Signed)
Debra Farrell, Debra Farrell 70 y.o., female [id number]     Chief Complaint: RIGHT neck mass  HPI: 70 year old white female has come in for evaluation of a RIGHT neck mass.  She believes this started when she fell and lacerated her RIGHT pinna 15 months ago.  This did heal nicely.  The mid neck mass has been getting larger and firmer ever since.  No fevers or night sweats.  No change in weight, appetite, or energy.  No other neck masses, axillary or groin masses.  She does not smoke nor drink.  No imaging or treatment thus far.  Workup included negative screen for HIV and hepatitis C.  She has noticed a lesion in the midline behind her lower central incisors which includes some loosening of those teeth.  No breathing,  voice, or swallowing issues.  No prior history of cancer anywhere.  I called to review her pathology.  The needle aspiration of the neck shows predominantly necrotic material with some atypical cells suspicious for squamous cell carcinoma.  The lesion of her lower alveolus is pyogenic granuloma was no evidence of malignancy.  I spoke with Dr. Avis Epley earlier today.  There is not enough material to do P. 16 staining.  I discussed this with the patient.  We have a CT scan of the neck scheduled next week.  We will now also order a PET scan.  She may require panendoscopy with biopsy of the base of tongue.  we will obtain P. 16 staining of this tissue.  she understands and agrees.  I reviewed her CT scan of the neck from yesterday.  She has a large enhancing mass of the RIGHT tongue base.  She has multiple necrotic nodes in the RIGHT neck but not in the LEFT.  She has multinodular enlargement of the RIGHT thyroid with some calcifications.    I will call and discuss this with her.  We will put her on for tumor board next week.  She probably will need a formal biopsy/panendoscopy of the RIGHT tongue base  with P 16 staining.  PET scan is pending.  I will order a thyroid ultrasound and ultrasound-guided  needle aspiration of the RIGHT thyroid.   I called to discuss her CT results.  She has a large RIGHT base of tongue mass, and multiple right-sided nodes.  We will discuss her case at tumor board tomorrow morning.  We will decide if we need additional tissue sampling specifically for P. 16.  She has her PET scan scheduled tomorrow.  She is getting appointments lined up for  medical oncology, radiation oncology, and will need to see Dr. Enrique Sack DDS as well.  I explained to her that we have set her up for thyroid ultrasound, and ultrasound-guided biopsy of the RIGHT thyroid lobe.  This is probably incidental to the base of tongue cancer.  I will discuss further results with her as they become available.  She understands and agrees.  Preoperative visit.  She is undergoing dental extractions next week and at the same time, we will perform panendoscopy and biopsy of the RIGHT tongue base mass.   I reviewed the findings thus far including PET scan positive in the RIGHT base of tongue and lymph nodes in both necks. her thyroid ultrasound showed a multinodular RIGHT lobe.  She is scheduled for needle aspiration in the immediate future.   She claims to be eating well but not gaining any weight.   I discussed my portion of the procedure including risks and  complications.  Questions were answered and informed consent was obtained.  I left her prescriptions for hydrocodone liquid and Peridex oral rinse.  I Discussed resumption of activities and diet.  I will see her back in one month.  I will call her with the pathology reports when available.  PMH: Past Medical History  Diagnosis Date  . Hypertension   . Hyperlipidemia   . Eczema   . Thalassemia minor   . Cancer of base of tongue ([place]) 10/14/2015    Surg Hx: Past Surgical History  Procedure Laterality Date  . Abdominal hysterectomy  1990    partial  . Incontinence surgery  1990, [id number]  . Rectocele repair    . Urocele      correction surgery     FHx:   Family History  Problem Relation Age of Onset  . Cancer Mother     lung  . Asthma Father    SocHx:  reports that she has never smoked. She has never used smokeless tobacco. She reports that she does not drink alcohol or use illicit drugs.  ALLERGIES:  Allergies  Allergen Reactions  . Morphine And Related Nausea And Vomiting     (Not in a hospital admission)  No results found for this or any previous visit (from the past 48 hour(s)). No results found.  NWG:NFAOZHYQ: Not feeling tired (fatigue).  No fever, no night sweats, and no recent weight loss. Head: No headache. Eyes: No eye symptoms. Otolaryngeal: No hearing loss, no earache, no tinnitus, and no purulent nasal discharge.  No nasal passage blockage (stuffiness), no snoring, no sneezing, no hoarseness, and no sore throat. Cardiovascular: No chest pain or discomfort  and no palpitations. Pulmonary: No dyspnea, no cough, and no wheezing. Gastrointestinal: No dysphagia  and no heartburn.  No nausea, no abdominal pain, and no melena.  No diarrhea. Genitourinary: No dysuria. Endocrine: No muscle weakness. Musculoskeletal: No calf muscle cramps, no arthralgias, and no soft tissue swelling. Neurological: No dizziness, no fainting, no tingling, and no numbness. Psychological: No anxiety  and no depression. Skin: No rash.  BP:148/78,  HR: 81 b/min,  BSA Calculated: 1.90 ,  BMI Calculated: 25.54 ,  Weight: 163.5 lb , BMI: 25.5 kg/m2,  Height: 5 ft 8 in.   PHYSICAL EXAM: She appears trim and basically healthy.  There is a visible mass in the RIGHT neck.  Mental status is sharp.  She hears well in conversational speech.  Voice is clear and respirations unlabored through the nose.  The head is atraumatic and neck supple.  Cranial nerves intact.  Ear canals are clear with normal drums.  Anterior nose is moist and patent.  Oral cavity reveals teeth and fair to poor repair.  There is a roughly 1.5 cm semi-pedunculated mass  behind teeth  #24 and 25  with loosening of those 2 teeth.  No other intraoral lesions.  Oropharynx shows surgically absent tonsils. Neck is remarkable for a firm ovoid 4 x 3 x 3 cm RIGHT level III mass.  No other palpable nodes.   Using the flexible laryngoscope, the nasopharynx is clear with normal eustachian tori.  Oropharynx shows moderately prominent lingual tonsils more to the RIGHT than the LEFT.  Hypopharynx/larynx normal.   On palpation of the base of tongue, there is mild fullness on the RIGHT side but no firmness, pain, or ulceration  Lungs: Clear to auscultation Heart: Regular rate and rhythm Abdomen: Soft, active Extremities: Normal configuration Neurologic: Symmetric, grossly intact.  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 Lymphadenopathy, cervical (785.6) (R59.0). Hypertension (401.9) (I10). 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  All the evidence points to a RIGHT base of tongue cancer with lymph nodes actually in both sides of her neck, but mostly the RIGHT.  A thyroid biopsy is pending but is likely to be benign.  We will check a TSH for thyroid function.    Next week, preceding Dr. Enrique Sack is dental extractions, we will do an evaluation of your throat under anesthesia including a biopsy of the tongue base.  I will call you with the pathology reports.  I would like to see you back to one month after that surgery at which point you should be under treatment.   your hearing testing today did show hearing loss which may be related to distant noise exposure.  Avoid future noise exposure.     I am leaving you prescriptions today for hydrocodone liquid for pain relief, and for  Peridex antibiotic mouth rinse after dental extractions.    Hydrocodone-Acetaminophen 7.5-325 MG/15ML Oral Solution;10-20 ml po q4-6h prn pain; Qty400; R0;  Rx. Chlorhexidine Gluconate 0.12 % Mouth/Throat Solution;5 ml swish/swallow 4 times daily; KBT248; R2; Rx.  Ilze Roselli 1/85/9093, 9:08 AM

## 2015-10-28 NOTE — Progress Notes (Signed)
Chemo treatment. I placed [facility] forms for dr. Alvy Bimler

## 2015-10-28 NOTE — Anesthesia Postprocedure Evaluation (Signed)
Anesthesia Post Note  Patient: Debra Farrell  Procedure(s) Performed: Procedure(s) (LRB): DIRECT LARYNGOSCOPY WITH BIOPSY (N/A) ESOPHAGOSCOPY (N/A) BRONCHOSCOPY (N/A) Extraction of tooth #'s 2-12, 14,15,17,18,20-29, 31 with alveoloplasy and mandibular left torus reduction (N/A)  Patient location during evaluation: PACU Anesthesia Type: General Level of consciousness: awake and alert, oriented and patient cooperative Pain management: pain level controlled Vital Signs Assessment: post-procedure vital signs reviewed and stable Respiratory status: spontaneous breathing, nonlabored ventilation and respiratory function stable Cardiovascular status: blood pressure returned to baseline and stable Postop Assessment: no signs of nausea or vomiting Anesthetic complications: no    Last Vitals:  Filed Vitals:   10/28/15 1200 10/28/15 1226  BP: 154/70 150/70  Pulse: 77 80  Temp:    Resp: 18 18    Last Pain:  Filed Vitals:   10/28/15 1230  PainSc: 2                  Janielle Mittelstadt,E. Prerna Harold

## 2015-10-29 ENCOUNTER — Encounter (HOSPITAL_COMMUNITY): Payer: Self-pay | Admitting: Otolaryngology

## 2015-10-29 ENCOUNTER — Encounter: Payer: Self-pay | Admitting: *Deleted

## 2015-10-29 NOTE — Progress Notes (Signed)
Debra Farrell Psychosocial Distress Screening Clinical Social Work  Clinical Social Work was referred by distress screening protocol.  The patient scored a 6 on the Psychosocial Distress Thermometer which indicates moderate distress. Clinical Social Worker contacted patient by phone to assess for distress and other psychosocial needs. [name] shared she currently has many appointments to keep track of, but no concerns at this time.  The patient has three daughters which are her main support.  CSW briefly explained CSW role and support services.  CSW plans to follow up with patient once she begins treatment.  ONCBCN DISTRESS SCREENING 10/16/2015  Screening Type Initial Screening  Distress experienced in past week (1-10) 6  Family Problem type Children  Emotional problem type Nervousness/Anxiety;Adjusting to illness  Physical Problem type Sleep/insomnia  Physician notified of physical symptoms Yes    Polo Riley, MSW, LCSW, OSW-C Clinical Social Worker [place] [phone]

## 2015-10-29 NOTE — Progress Notes (Signed)
  Oncology Nurse Navigator Documentation  Navigator Location: [facility] (10/27/15 1035) Navigator Encounter Type: Education (10/27/15 1035)           Patient Visit Type: Inpatient (10/27/15 1035)       Met with Debra Farrell in [place] Stay prior to PEG and port placement.  Her dtr was at the bedside.  Using Teach Back, provided PEG education using instructional device, including care of tube insertion site, S&S of infection, when to clamp/unclamp for gravity bolus administration of nutritional supplement, hydration and medications.  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   Provided her the following PEG supplies:  2x2 gauze  Split gauze  Cotton-tip applicators  782 mL bottle of saline   Paper tape  Mesh brief  60 mL syringe  She understands she will receive ongoing support for her PEG by myself and RadOnc Nursing.  Gayleen Orem, RN, BSN, [place] [facility] [place] [phone]                               Time Spent with Patient: 30 (10/27/15 1035)

## 2015-10-30 ENCOUNTER — Encounter: Payer: Self-pay | Admitting: Radiation Oncology

## 2015-10-30 ENCOUNTER — Other Ambulatory Visit: Payer: Self-pay | Admitting: Hematology and Oncology

## 2015-10-30 ENCOUNTER — Other Ambulatory Visit (HOSPITAL_COMMUNITY): Payer: Self-pay | Admitting: Dentistry

## 2015-10-30 MED ORDER — ACETAMINOPHEN-CODEINE 300-30 MG PO TABS: 1.0000 | ORAL_TABLET | Freq: Four times a day (QID) | ORAL | Status: DC | PRN

## 2015-10-30 MED FILL — ACETAMINOPHEN/COD #3 TABLET: 300-30 | 3 days supply | Qty: 20 | Fill #0

## 2015-10-30 NOTE — Progress Notes (Signed)
Paperwork (Matrix) given to RN, 10/30/15 Ardeen Fillers)

## 2015-10-30 NOTE — Telephone Encounter (Signed)
A user error has taken place: encounter opened in error, closed for administrative reasons.

## 2015-11-02 ENCOUNTER — Ambulatory Visit: Payer: 59

## 2015-11-02 ENCOUNTER — Ambulatory Visit
Admission: RE | Admit: 2015-11-02 | Discharge: 2015-11-02 | Disposition: A | Discharge status: Home / Self Care | Payer: 59 | Source: Ambulatory Visit | Attending: Radiation Oncology | Admitting: Radiation Oncology

## 2015-11-02 DIAGNOSIS — C01 Malignant neoplasm of base of tongue: Secondary | ICD-10-CM

## 2015-11-02 DIAGNOSIS — Z510 Encounter for antineoplastic radiation therapy: Secondary | ICD-10-CM | POA: Diagnosis not present

## 2015-11-02 MED ORDER — SODIUM CHLORIDE 0.9% FLUSH
3.0000 mL | Freq: Once | INTRAVENOUS | Status: AC
  Administered 2015-11-02: 3 mL via INTRAVENOUS

## 2015-11-02 NOTE — Progress Notes (Signed)
Head and Neck Cancer Simulation, IMRT treatment planning, and Special treatment procedure note   Outpatient  Diagnosis:    ICD-9-CM ICD-10-CM   1. 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 An Aquaplast head and shoulder mask was custom fitted to the patient's anatomy. High-resolution CT axial imaging was obtained of the head and neck with contrast. I verified that the quality of the imaging is good for treatment planning. 1 Medically Necessary Treatment Device was fabricated and supervised by me: Aquaplast mask.   Treatment planning note I plan to treat the patient with IMRT. I plan to treat the patient's tumor and bilateral neck nodes. I plan to treat to a total dose of 70 Gray in 35  fractions. Dose calculation was ordered from dosimetry.  IMRT planning Note  IMRT is an important modality to deliver adequate dose to the patient's at risk tissues while sparing the patient's normal structures, including the: esophagus, parotid tissue, mandible, brain stem, spinal cord, oral cavity, brachial plexus.  This justifies the use of IMRT in the patient's treatment.   Special Treatment Procedure Note:  The patient will be receiving chemotherapy concurrently. Chemotherapy heightens the risk of side effects. I have considered this during the patient's treatment planning process and will monitor the patient accordingly for side effects on a weekly basis. Concurrent chemotherapy increases the complexity of this patient's treatment and therefore this constitutes a special treatment procedure.  -----------------------------------  [name], MD

## 2015-11-03 ENCOUNTER — Other Ambulatory Visit: Payer: Self-pay | Admitting: General Surgery

## 2015-11-03 ENCOUNTER — Ambulatory Visit: Payer: Self-pay | Admitting: Physical Therapy

## 2015-11-03 ENCOUNTER — Encounter: Payer: 59 | Admitting: Nutrition

## 2015-11-03 MED FILL — HYDROCODON-APAP 5-325: 5-325 | 4 days supply | Qty: 50 | Fill #0

## 2015-11-04 ENCOUNTER — Other Ambulatory Visit: Payer: Self-pay | Admitting: Radiology

## 2015-11-05 ENCOUNTER — Ambulatory Visit (HOSPITAL_COMMUNITY)
Admission: RE | Admit: 2015-11-05 | Discharge: 2015-11-05 | Disposition: A | Discharge status: Home / Self Care | Payer: 59 | Source: Ambulatory Visit | Attending: Hematology and Oncology | Admitting: Hematology and Oncology

## 2015-11-05 ENCOUNTER — Encounter (HOSPITAL_COMMUNITY): Payer: Self-pay

## 2015-11-05 ENCOUNTER — Encounter: Payer: Self-pay | Admitting: *Deleted

## 2015-11-05 ENCOUNTER — Other Ambulatory Visit: Payer: Self-pay | Admitting: Hematology and Oncology

## 2015-11-05 ENCOUNTER — Other Ambulatory Visit: Payer: 59

## 2015-11-05 DIAGNOSIS — Z79899 Other long term (current) drug therapy: Secondary | ICD-10-CM | POA: Insufficient documentation

## 2015-11-05 DIAGNOSIS — C01 Malignant neoplasm of base of tongue: Secondary | ICD-10-CM | POA: Insufficient documentation

## 2015-11-05 DIAGNOSIS — Z5111 Encounter for antineoplastic chemotherapy: Secondary | ICD-10-CM | POA: Diagnosis not present

## 2015-11-05 LAB — CBC WITH DIFFERENTIAL/PLATELET
BASOS ABS: 0.1 10*3/uL (ref 0.0–0.1)
BASOS PCT: 1 %
EOS ABS: 0.1 10*3/uL (ref 0.0–0.7)
Eosinophils Relative: 2 %
HCT: 33.4 % — ABNORMAL LOW (ref 36.0–46.0)
HEMOGLOBIN: 10.3 g/dL — AB (ref 12.0–15.0)
LYMPHS PCT: 19 %
Lymphs Abs: 1.2 10*3/uL (ref 0.7–4.0)
MCH: 19.4 pg — ABNORMAL LOW (ref 26.0–34.0)
MCHC: 30.8 g/dL (ref 30.0–36.0)
MCV: 62.9 fL — ABNORMAL LOW (ref 78.0–100.0)
Monocytes Absolute: 0.5 10*3/uL (ref 0.1–1.0)
Monocytes Relative: 8 %
NEUTROS PCT: 70 %
Neutro Abs: 4.4 10*3/uL (ref 1.7–7.7)
Platelets: 298 10*3/uL (ref 150–400)
RBC: 5.31 MIL/uL — ABNORMAL HIGH (ref 3.87–5.11)
RDW: 14.5 % (ref 11.5–15.5)
WBC: 6.3 10*3/uL (ref 4.0–10.5)

## 2015-11-05 LAB — COMPREHENSIVE METABOLIC PANEL
ALBUMIN: 4.3 g/dL (ref 3.5–5.0)
ALK PHOS: 92 U/L (ref 38–126)
ALT: 29 U/L (ref 14–54)
AST: 30 U/L (ref 15–41)
Anion gap: 12 (ref 5–15)
BUN: 12 mg/dL (ref 6–20)
CALCIUM: 9.7 mg/dL (ref 8.9–10.3)
CO2: 25 mmol/L (ref 22–32)
CREATININE: 0.83 mg/dL (ref 0.44–1.00)
Chloride: 104 mmol/L (ref 101–111)
GFR calc Af Amer: >60 mL/min (ref >60)
GFR calc non Af Amer: >60 mL/min (ref >60)
GLUCOSE: 84 mg/dL (ref 65–99)
Potassium: 3.7 mmol/L (ref 3.5–5.1)
SODIUM: 141 mmol/L (ref 135–145)
Total Bilirubin: 1 mg/dL (ref 0.3–1.2)
Total Protein: 7.2 g/dL (ref 6.5–8.1)

## 2015-11-05 LAB — APTT: APTT: 35 s (ref 24–37)

## 2015-11-05 LAB — PROTIME-INR
INR: 1.18 (ref 0.00–1.49)
Prothrombin Time: 14.7 seconds (ref 11.6–15.2)

## 2015-11-05 LAB — MAGNESIUM: Magnesium: 2.2 mg/dL (ref 1.7–2.4)

## 2015-11-05 MED ORDER — HEPARIN SOD (PORK) LOCK FLUSH 100 UNIT/ML IV SOLN
INTRAVENOUS | Status: AC | PRN
  Administered 2015-11-05: 500 [IU]

## 2015-11-05 MED ORDER — CEFAZOLIN SODIUM-DEXTROSE 2-3 GM-% IV SOLR
2.0000 g | INTRAVENOUS | Status: AC
  Administered 2015-11-05: 2 g via INTRAVENOUS

## 2015-11-05 MED ORDER — FENTANYL CITRATE (PF) 100 MCG/2ML IJ SOLN
INTRAMUSCULAR | Status: AC
  Filled 2015-11-05: qty 4

## 2015-11-05 MED ORDER — GLUCAGON HCL RDNA (DIAGNOSTIC) 1 MG IJ SOLR
INTRAMUSCULAR | Status: AC | PRN
  Administered 2015-11-05: .5 mg via INTRAVENOUS

## 2015-11-05 MED ORDER — HYDROCODONE-ACETAMINOPHEN 5-325 MG PO TABS: 1.0000 | ORAL_TABLET | ORAL | Status: DC | PRN

## 2015-11-05 MED ORDER — FENTANYL CITRATE (PF) 100 MCG/2ML IJ SOLN
INTRAMUSCULAR | Status: AC | PRN
  Administered 2015-11-05 (×2): 50 ug via INTRAVENOUS
  Administered 2015-11-05 (×4): 25 ug via INTRAVENOUS

## 2015-11-05 MED ORDER — IOHEXOL 300 MG/ML  SOLN
50.0000 mL | Freq: Once | INTRAMUSCULAR | Status: AC | PRN
  Administered 2015-11-05: 10 mL

## 2015-11-05 MED ORDER — MIDAZOLAM HCL 2 MG/2ML IJ SOLN
INTRAMUSCULAR | Status: AC
  Filled 2015-11-05: qty 6

## 2015-11-05 MED ORDER — ONDANSETRON HCL 4 MG/2ML IJ SOLN
4.0000 mg | INTRAMUSCULAR | Status: DC | PRN
  Administered 2015-11-05: 4 mg via INTRAVENOUS
  Filled 2015-11-05: qty 2

## 2015-11-05 MED ORDER — MIDAZOLAM HCL 2 MG/2ML IJ SOLN
INTRAMUSCULAR | Status: AC | PRN
  Administered 2015-11-05: 1 mg via INTRAVENOUS
  Administered 2015-11-05: 0.5 mg via INTRAVENOUS
  Administered 2015-11-05: 1 mg via INTRAVENOUS
  Administered 2015-11-05: 0.5 mg via INTRAVENOUS
  Administered 2015-11-05 (×2): 1 mg via INTRAVENOUS

## 2015-11-05 MED ORDER — GLUCAGON HCL RDNA (DIAGNOSTIC) 1 MG IJ SOLR
INTRAMUSCULAR | Status: AC
  Filled 2015-11-05: qty 1

## 2015-11-05 MED ORDER — CEFAZOLIN SODIUM-DEXTROSE 2-3 GM-% IV SOLR
INTRAVENOUS | Status: AC
  Filled 2015-11-05: qty 50

## 2015-11-05 MED ORDER — HYDROMORPHONE HCL 1 MG/ML IJ SOLN
1.0000 mg | INTRAMUSCULAR | Status: DC | PRN
  Administered 2015-11-05: 1 mg via INTRAVENOUS
  Filled 2015-11-05: qty 1

## 2015-11-05 MED ORDER — LIDOCAINE HCL 1 % IJ SOLN
INTRAMUSCULAR | Status: AC
  Filled 2015-11-05: qty 20

## 2015-11-05 MED ORDER — LIDOCAINE-EPINEPHRINE 2 %-1:100000 IJ SOLN
INTRAMUSCULAR | Status: AC
  Filled 2015-11-05: qty 1

## 2015-11-05 MED ORDER — HEPARIN SOD (PORK) LOCK FLUSH 100 UNIT/ML IV SOLN
INTRAVENOUS | Status: AC
  Filled 2015-11-05: qty 5

## 2015-11-05 MED ORDER — SODIUM CHLORIDE 0.9 % IV SOLN: INTRAVENOUS | Status: DC

## 2015-11-05 NOTE — Discharge Instructions (Signed)
Implanted Port Home Guide °An implanted port is a type of central line that is placed under the skin. Central lines are used to provide IV access when treatment or nutrition needs to be given through a person's veins. Implanted ports are used for long-term IV access. An implanted port may be placed because:  °· You need IV medicine that would be irritating to the small veins in your hands or arms.   °· You need long-term IV medicines, such as antibiotics.   °· You need IV nutrition for a long period.   °· You need frequent blood draws for lab tests.   °· You need dialysis.   °Implanted ports are usually placed in the chest area, but they can also be placed in the upper arm, the abdomen, or the leg. An implanted port has two main parts:  °· Reservoir. The reservoir is round and will appear as a small, raised area under your skin. The reservoir is the part where a needle is inserted to give medicines or draw blood.   °· Catheter. The catheter is a thin, flexible tube that extends from the reservoir. The catheter is placed into a large vein. Medicine that is inserted into the reservoir goes into the catheter and then into the vein.   °HOW WILL I CARE FOR MY INCISION SITE? °Do not get the incision site wet. Bathe or shower as directed by your health care provider.  °HOW IS MY PORT ACCESSED? °Special steps must be taken to access the port:  °· Before the port is accessed, a numbing cream can be placed on the skin. This helps numb the skin over the port site.   °· Your health care provider uses a sterile technique to access the port. °· Your health care provider must put on a mask and sterile gloves. °· The skin over your port is cleaned carefully with an antiseptic and allowed to dry. °· The port is gently pinched between sterile gloves, and a needle is inserted into the port. °· Only "non-coring" port needles should be used to access the port. Once the port is accessed, a blood return should be checked. This helps  ensure that the port is in the vein and is not clogged.   °· If your port needs to remain accessed for a constant infusion, a clear (transparent) bandage will be placed over the needle site. The bandage and needle will need to be changed every week, or as directed by your health care provider.   °· Keep the bandage covering the needle clean and dry. Do not get it wet. Follow your health care provider's instructions on how to take a shower or bath while the port is accessed.   °· If your port does not need to stay accessed, no bandage is needed over the port.   °WHAT IS FLUSHING? °Flushing helps keep the port from getting clogged. Follow your health care provider's instructions on how and when to flush the port. Ports are usually flushed with saline solution or a medicine called heparin. The need for flushing will depend on how the port is used.  °· If the port is used for intermittent medicines or blood draws, the port will need to be flushed:   °· After medicines have been given.   °· After blood has been drawn.   °· As part of routine maintenance.   °· If a constant infusion is running, the port may not need to be flushed.   °HOW LONG WILL MY PORT STAY IMPLANTED? °The port can stay in for as long as your health care   provider thinks it is needed. When it is time for the port to come out, surgery will be done to remove it. The procedure is similar to the one performed when the port was put in.  WHEN SHOULD I SEEK IMMEDIATE MEDICAL CARE? When you have an implanted port, you should seek immediate medical care if:   You notice a bad smell coming from the incision site.   You have swelling, redness, or drainage at the incision site.   You have more swelling or pain at the port site or the surrounding area.   You have a fever that is not controlled with medicine.   This information is not intended to replace advice given to you by your health care provider. Make sure you discuss any questions you have with  your health care provider.   Document Released: 09/12/2005 Document Revised: 07/03/2013 Document Reviewed: 05/20/2013 Elsevier Interactive Patient Education 2016 [employer]   Moderate Conscious Sedation, Adult, Care After Refer to this sheet in the next few weeks. These instructions provide you with information on caring for yourself after your procedure. Your health care provider may also give you more specific instructions. Your treatment has been planned according to current medical practices, but problems sometimes occur. Call your health care provider if you have any problems or questions after your procedure. WHAT TO EXPECT AFTER THE PROCEDURE  After your procedure:  You may feel sleepy, clumsy, and have poor balance for several hours.  Vomiting may occur if you eat too soon after the procedure. HOME CARE INSTRUCTIONS  Do not participate in any activities where you could become injured for at least 24 hours. Do not:  Drive.  Swim.  Ride a bicycle.  Operate heavy machinery.  Cook.  Use power tools.  Climb ladders.  Work from a high place.  Do not make important decisions or sign legal documents until you are improved.  If you vomit, drink water, juice, or soup when you can drink without vomiting. Make sure you have little or no nausea before eating solid foods.  Only take over-the-counter or prescription medicines for pain, discomfort, or fever as directed by your health care provider.  Make sure you and your family fully understand everything about the medicines given to you, including what side effects may occur.  You should not drink alcohol, take sleeping pills, or take medicines that cause drowsiness for at least 24 hours.  If you smoke, do not smoke without supervision.  If you are feeling better, you may resume normal activities 24 hours after you were sedated.  Keep all appointments with your health care provider. SEEK MEDICAL CARE IF:  Your skin is  pale or bluish in color.  You continue to feel nauseous or vomit.  Your pain is getting worse and is not helped by medicine.  You have bleeding or swelling.  You are still sleepy or feeling clumsy after 24 hours. SEEK IMMEDIATE MEDICAL CARE IF:  You develop a rash.  You have difficulty breathing.  You develop any type of allergic problem.  You have a fever. MAKE SURE YOU:  Understand these instructions.  Will watch your condition.  Will get help right away if you are not doing well or get worse.   This information is not intended to replace advice given to you by your health care provider. Make sure you discuss any questions you have with your health care provider.   Document Released: 07/03/2013 Document Revised: 10/03/2014 Document Reviewed: 07/03/2013 Elsevier Interactive  Patient Education 2016 [employer].   Gastrostomy Tube Replacement, Care After Refer to this sheet in the next few weeks. These instructions provide you with information on caring for yourself after your procedure. Your health care provider may also give you more specific instructions. Your treatment has been planned according to current medical practices, but problems sometimes occur. Call your health care provider if you have any problems or questions after your procedure. WHAT TO EXPECT AFTER THE PROCEDURE  After your procedure, it is typical to have the following:   Mild abdominal pain.  A small amount of blood-tinged fluid leaking from the replacement site. HOME CARE INSTRUCTIONS  You may resume your normal level of activity.  You may resume your normal feedings.  Care for your gastrostomy tube as you did before, or as directed by your health care provider. SEEK MEDICAL CARE IF:  You have a fever or chills.  You have redness or irritation near the insertion site.  You continue to have abdominal pain or leaking around your gastrostomy tube. SEEK IMMEDIATE MEDICAL CARE IF:   You  develop bleeding or significant discharge around the tube.  You have severe abdominal pain.  Your new tube does not seem to be working properly.  You are unable to get feedings into the tube.  Your tube comes out for any reason.    This information is not intended to replace advice given to you by your health care provider. Make sure you discuss any questions you have with your health care provider.   Document Released: 04/09/2014 Document Reviewed: 04/09/2014 Elsevier Interactive Patient Education [employer].

## 2015-11-05 NOTE — Sedation Documentation (Signed)
Patient denies pain and is resting comfortably.  

## 2015-11-05 NOTE — H&P (Signed)
HPI:  The patient has had a H&P performed within the last 30 days, all history, medications, and exam have been reviewed. The patient denies any interval changes since the H&P.  Medications: Prior to Admission medications   Medication Sig Start Date End Date Taking? Authorizing Provider  Acetaminophen-Codeine 300-30 MG tablet Take 1-2 tablets by mouth every 6 (six) hours as needed for pain. Patient not taking: Reported on 11/02/2015 10/30/15   Lenn Cal, DDS  hydrochlorothiazide (HYDRODIURIL) 25 MG tablet Take 25 mg by mouth daily.    Historical Provider, MD  HYDROcodone-acetaminophen (NORCO/VICODIN) 5-325 MG tablet Take 1 tablet by mouth every 6 (six) hours as needed for moderate pain.    Historical Provider, MD  HYDROcodone-homatropine (HYCODAN) 5-1.5 MG/5ML syrup Take 5 mLs by mouth every 6 (six) hours as needed for cough.    Historical Provider, MD  ibuprofen (ADVIL,MOTRIN) 200 MG tablet Take 400 mg by mouth every 6 (six) hours as needed for moderate pain.    Historical Provider, MD     Vital Signs: BP 174/82 mmHg  Pulse 69  Temp(Src) 98.6 F (37 C) (Oral)  Resp 16  SpO2 100%  Physical Exam   General: pleasant, WD, WN white female who is laying in bed in NAD HEENT: head is normocephalic, atraumatic.  Sclera are noninjected.  PERRL.  Ears and nose without any masses or lesions.  Slight fullness to the right side of her neck Heart: regular, rate, and rhythm.  Normal s1,s2. No obvious murmurs, gallops, or rubs noted.  Palpable radial and pedal pulses bilaterally Lungs: CTAB, no wheezes, rhonchi, or rales noted.  Respiratory effort nonlabored Abd: soft, NT, ND, +BS, no masses, hernias, or organomegaly Skin: warm and dry with no masses, lesions, or rashes Psych: A&Ox3 with an appropriate affect.   Mallampati Score:  MD Evaluation Airway: WNL Heart: WNL Abdomen: WNL Chest/ Lungs: WNL ASA  Classification: 3 Mallampati/Airway Score: Two  Labs: From today, 2-9, are  still in process   CBC:  Recent Labs  09/11/15 0920 10/23/15 1003 10/27/15 1000  WBC 7.2 6.8 5.9  HGB 11.6* 11.1* 11.6*  HCT 35.9* 35.9* 37.3  PLT 307 317 288    COAGS:  Recent Labs  10/27/15 1000  INR 1.08    BMP:  Recent Labs  04/07/15 1536 10/08/15 1406 10/23/15 1003 10/27/15 1000  NA 141  --  136 139  K 3.6  --  3.6 3.7  CL 100  --  101 102  CO2 29  --  27 27  GLUCOSE 114*  --  133* 107*  BUN 33*  --  25* 21*  CALCIUM 9.8  --  9.6 9.9  CREATININE 1.04 0.82 1.03* 0.85  GFRNONAA  --  >60 55* >60  GFRAA  --  >60 >60 >60    LIVER FUNCTION TESTS: No results for input(s): BILITOT, AST, ALT, ALKPHOS, PROT, ALBUMIN in the last 8760 hours.  Assessment/Plan:  1. Base of tongue cancer -we will proceed with PAC placement and PEG tube placement today. -no changes in the last week since the patient was last seen.  She did undergo teeth extractions and tolerated that well. Risks and Benefits discussed with the patient including, but not limited to bleeding, infection, pneumothorax, or fibrin sheath development and need for additional procedures. All of the patient's questions were answered, patient is agreeable to proceed. Consent signed and in chart. -Risks and Benefits discussed with the patient including, but not limited to the need for a  barium enema during the procedure, bleeding, infection, peritonitis, or damage to adjacent structures. All of the patient's questions were answered, patient is agreeable to proceed. Consent signed and in chart.  Signed: Wray Goehring E 11/05/2015, 1:10 PM

## 2015-11-05 NOTE — Procedures (Signed)
1. R IJ Port catheter 2. Gastrostomy tube placed No complication No blood loss. See complete dictation in [facility].

## 2015-11-05 NOTE — Discharge Instructions (Signed)
Care of a Feeding Tube People who have trouble swallowing or cannot take food or medicine by mouth are sometimes given feeding tubes. A feeding tube can go into the nose and down to the stomach or through the skin in the abdomen and into the stomach or small bowel. Some of the names of these feeding tubes are gastrostomy tubes, PEG lines, nasogastric tubes, and gastrojejunostomy tubes.  SUPPLIES NEEDED TO CARE FOR THE TUBE SITE  Clean gloves.  Clean wash cloth, gauze pads, or soft paper towel.  Cotton swabs.  Skin barrier ointment or cream.  Soap and water.  Pre-cut foam pads or gauze (that go around the tube).  Tube tape. TUBE SITE CARE 1. Have all supplies ready and available. 2. Wash hands well. 3. Put on clean gloves. 4. Remove the soiled foam pad or gauze, if present, that is found under the tube stabilizer. Change the foam pad or gauze daily or when soiled or moist. 5. Check the skin around the tube site for redness, rash, swelling, drainage, or extra tissue growth. If you notice any of these, call your caregiver. 6. Moisten gauze and cotton swabs with water and soap. 7. Wipe the area closest to the tube (right near the stoma) with cotton swabs. Wipe the surrounding skin with moistened gauze. Rinse with water. 8. Dry the skin and stoma site with a dry gauze pad or soft paper towel. Do not use antibiotic ointments at the tube site. 9. If the skin is red, apply a skin barrier cream or ointment (such as petroleum jelly) in a circular motion, using a cotton swab. The cream or ointment will provide a moisture barrier for the skin and helps with wound healing. 10. Apply a new pre-cut foam pad or gauze around the tube. Secure it with tape around the edges. If no drainage is present, foam pads or gauze may be left off. 11. Use tape or an anchoring device to fasten the feeding tube to the skin for comfort or as directed. Rotate where you tape the tube to avoid skin damage from the  adhesive. 12. Position the person in a semi-upright position (30-45 degree angle). 13. Throw away used supplies. 14. Remove gloves. 15. Wash hands. SUPPLIES NEEDED TO FLUSH A FEEDING TUBE  Clean gloves.  60 mL syringe (that connects to the feeding tube).  Towel.  Water. FLUSHING A FEEDING TUBE  1. Have all supplies ready and available. 2. Wash hands well. 3. Put on clean gloves. 4. Draw up 30 mL of water in the syringe. 5. Kink the feeding tube while disconnecting it from the feeding-bag tubing or while removing the plug at the end of the tube. Kinking closes the tube and prevents secretions in the tube from spilling out. 6. Insert the tip of the syringe into the end of the feeding tube. Release the kink. Slowly inject the water. 7. If unable to inject the water, the person with the feeding tube should lay on his or her left side. The tip of the tube may be against the stomach wall, blocking fluid flow. Changing positions may move the tip away from the stomach wall. After repositioning, try injecting the water again. 8. After injecting the water, remove the syringe. 9. Always flush before giving the first medicine, between medicines, and after the final medicine before starting a feeding. This prevents medicines from clogging the tube. 10. Throw away used supplies. 11. Remove gloves. 12. Wash hands.   This information is not intended to replace  advice given to you by your health care provider. Make sure you discuss any questions you have with your health care provider.   Document Released: 09/12/2005 Document Revised: 08/29/2012 Document Reviewed: 04/26/2012 Elsevier Interactive Patient Education 2016 [place]. Gastrostomy Tube Home Guide, Adult A gastrostomy tube is a tube that is surgically placed into the stomach. It is also called a "G-tube." G-tubes are used when a person is unable to eat and drink enough on their own to stay healthy. The tube is inserted into the stomach  through a small cut (incision) in the skin. This tube is used for:  Feeding.  Giving medication. GASTROSTOMY TUBE CARE 16. Wash your hands with soap and water. 23. Remove the old dressing (if any). Some styles of G-tubes may need a dressing inserted between the skin and the G-tube. Other types of G-tubes do not require a dressing. Ask your health care provider if a dressing is needed. 18. Check the area where the tube enters the skin (insertion site) for redness, swelling, or pus-like (purulent) drainage. A small amount of clear or tan liquid drainage is normal. Check to make sure scar tissue (skin) is not growing around the insertion site. This could have a raised, bumpy appearance. 19. A cotton swab can be used to clean the skin around the tube: 1. When the G-tube is first put in, a normal saline solution or water can be used to clean the skin. 2. Mild soap and warm water can be used when the skin around the G-tube site has healed. 3. Roll the cotton swab around the G-tube insertion site to remove any drainage or crusting at the insertion site. STOMACH RESIDUALS Feeding tube residuals are the amount of liquids that are in the stomach at any given time. Residuals may be checked before giving feedings, medications, or as instructed by your health care provider.  Ask your health care provider if there are instances when you would not start tube feedings depending on the amount or type of contents withdrawn from the stomach.  Check residuals by attaching a syringe to the G-tube and pulling back on the syringe plunger. Note the amount, and return the residual back into the stomach. FLUSHING THE G-TUBE 13. The G-tube should be periodically flushed with clean warm water to keep it from clogging.  Flush the G-tube after feedings or medications. Draw up 30 mL of warm water in a syringe. Connect the syringe to the G-tube and slowly push the water into the tube.  Do not push feedings, medications, or  flushes rapidly. Flush the G-tube gently and slowly.  Only use syringes made for G-tubes to flush medications or feedings.  Your health care provider may want the G-tube flushed more often or with more water. If this is the case, follow your health care provider's instructions. FEEDINGS Your health care provider will determine whether feedings are given as a bolus (a certain amount given at one time and at scheduled times) or whether feedings will be given continuously on a feeding pump.   Formulas should be given at room temperature.  If feedings are continuous, no more than 4 hours worth of feedings should be placed in the feeding bag. This helps prevent spoilage or accidental excess infusion.  Cover and place unused formula in the refrigerator.  If feedings are continuous, stop the feedings when medications or flushes are given. Be sure to restart the feedings.  Feeding bags and syringes should be replaced as instructed by your health care provider.  GIVING MEDICATION   In general, it is best if all medications are in a liquid form for G-tube administration. Liquid medications are less likely to clog the G-tube.  Mix the liquid medication with 30 mL (or amount recommended by your health care provider) of warm water.  Draw up the medication into the syringe.  Attach the syringe to the G-tube and slowly push the mixture into the G-tube.  After giving the medication, draw up 30 mL of warm water in the syringe and slowly flush the G-tube.  For pills or capsules, check with your health care provider first before crushing medications. Some pills are not effective if they are crushed. Some capsules are sustained-release medications.  If appropriate, crush the pill or capsule and mix with 30 mL of warm water. Using the syringe, slowly push the medication through the tube, then flush the tube with another 30 mL of tap water. G-TUBE PROBLEMS G-tube was pulled out.  Cause: May have been  pulled out accidentally.  Solutions: Cover the opening with clean dressing and tape. Call your health care provider right away. The G-tube should be put in as soon as possible (within 4 hours) so the G-tube opening (tract) does not close. The G-tube needs to be put in at a health care setting. An X-ray needs to be done to confirm placement before the G-tube can be used again. Redness, irritation, soreness, or foul odor around the gastrostomy site.  Cause: May be caused by leakage or infection.  Solutions: Call your health care provider right away. Large amount of leakage of fluid or mucus-like liquid present (a large amount means it soaks clothing).  Cause: Many reasons could cause the G-tube to leak.  Solutions: Call your health care provider to discuss the amount of leakage. Skin or scar tissue appears to be growing where tube enters skin.   Cause: Tissue growth may develop around the insertion site if the G-tube is moved or pulled on excessively.  Solutions: Secure tube with tape so that excess movement does not occur. Call your health care provider. G-tube is clogged.  Cause: Thick formula or medication.  Solutions: Try to slowly push warm water into the tube with a large syringe. Never try to push any object into the tube to unclog it. Do not force fluid into the G-tube. If you are unable to unclog the tube, call your health care provider right away. TIPS  Head of bed (HOB) position refers to the upright position of a person's upper body.  When giving medications or a feeding bolus, keep the [facility] up as told by your health care provider. Do this during the feeding and for 1 hour after the feeding or medication administration.  If continuous feedings are being given, it is best to keep the [facility] up as told by your health care provider. When ADLs (activities of daily living) are performed and the [facility] needs to be flat, be sure to turn the feeding pump off. Restart the feeding pump when the  [facility] is returned to the recommended height.  Do not pull or put tension on the tube.  To prevent fluid backflow, kink the G-tube before removing the cap or disconnecting a syringe.  Check the G-tube length every day. Measure from the insertion site to the end of the G-tube. If the length is longer than previous measurements, the tube may be coming out. Call your health care provider if you notice increasing G-tube length.  Oral care, such as brushing teeth,  must be continued.  You may need to remove excess air (vent) from the G-tube. Your health care provider will tell you if this is needed.  Always call your health care provider if you have questions or problems with the G-tube. SEEK IMMEDIATE MEDICAL CARE IF:   You have severe abdominal pain, tenderness, or abdominal bloating (distension).  You have nausea or vomiting.  You are constipated or have problems moving your bowels.  The G-tube insertion site is red, swollen, has a foul smell, or has yellow or brown drainage.  You have difficulty breathing or shortness of breath.  You have a fever.  You have a large amount of feeding tube residuals.  The G-tube is clogged and cannot be flushed. MAKE SURE YOU:   Understand these instructions.  Will watch your condition.  Will get help right away if you are not doing well or get worse.   This information is not intended to replace advice given to you by your health care provider. Make sure you discuss any questions you have with your health care provider.   Document Released: 11/21/2001 Document Revised: 01/27/2015 Document Reviewed: 05/20/2013 Elsevier Interactive Patient Education 2016 [place] Insertion, Care After Refer to this sheet in the next few weeks. These instructions provide you with information on caring for yourself after your procedure. Your health care provider may also give you more specific instructions. Your treatment has been planned  according to current medical practices, but problems sometimes occur. Call your health care provider if you have any problems or questions after your procedure. WHAT TO EXPECT AFTER THE PROCEDURE After your procedure, it is typical to have the following:   Discomfort at the port insertion site. Ice packs to the area will help.  Bruising on the skin over the port. This will subside in 3-4 days. HOME CARE INSTRUCTIONS 20. After your port is placed, you will get a manufacturer's information card. The card has information about your port. Keep this card with you at all times.  21. Know what kind of port you have. There are many types of ports available.  72. Wear a medical alert bracelet in case of an emergency. This can help alert health care workers that you have a port.  23. The port can stay in for as long as your health care provider believes it is necessary.  24. A home health care nurse may give medicines and take care of the port.  76. You or a family member can get special training and directions for giving medicine and taking care of the port at home.  SEEK MEDICAL CARE IF:   Your port does not flush or you are unable to get a blood return.   You have a fever or chills. SEEK IMMEDIATE MEDICAL CARE IF: 14. You have new fluid or pus coming from your incision.  15. You notice a bad smell coming from your incision site.  16. You have swelling, pain, or more redness at the incision or port site.  17. You have chest pain or shortness of breath.   This information is not intended to replace advice given to you by your health care provider. Make sure you discuss any questions you have with your health care provider.   Document Released: 07/03/2013 Document Revised: 09/17/2013 Document Reviewed: 07/03/2013 Elsevier Interactive Patient Education 2016 [place] An implanted port is a type of central line that is placed under the skin. Central lines  are  used to provide IV access when treatment or nutrition needs to be given through a person's veins. Implanted ports are used for long-term IV access. An implanted port may be placed because:   You need IV medicine that would be irritating to the small veins in your hands or arms.   You need long-term IV medicines, such as antibiotics.   You need IV nutrition for a long period.   You need frequent blood draws for lab tests.   You need dialysis.  Implanted ports are usually placed in the chest area, but they can also be placed in the upper arm, the abdomen, or the leg. An implanted port has two main parts:  26. Reservoir. The reservoir is round and will appear as a small, raised area under your skin. The reservoir is the part where a needle is inserted to give medicines or draw blood.  27. Catheter. The catheter is a thin, flexible tube that extends from the reservoir. The catheter is placed into a large vein. Medicine that is inserted into the reservoir goes into the catheter and then into the vein.  HOW WILL I CARE FOR MY INCISION SITE? Do not get the incision site wet. Bathe or shower as directed by your health care provider.  HOW IS MY PORT ACCESSED? Special steps must be taken to access the port:   Before the port is accessed, a numbing cream can be placed on the skin. This helps numb the skin over the port site.   Your health care provider uses a sterile technique to access the port.  Your health care provider must put on a mask and sterile gloves.  The skin over your port is cleaned carefully with an antiseptic and allowed to dry.  The port is gently pinched between sterile gloves, and a needle is inserted into the port.  Only "non-coring" port needles should be used to access the port. Once the port is accessed, a blood return should be checked. This helps ensure that the port is in the vein and is not clogged.   If your port needs to remain accessed for a constant  infusion, a clear (transparent) bandage will be placed over the needle site. The bandage and needle will need to be changed every week, or as directed by your health care provider.   Keep the bandage covering the needle clean and dry. Do not get it wet. Follow your health care provider's instructions on how to take a shower or bath while the port is accessed.   If your port does not need to stay accessed, no bandage is needed over the port.  WHAT IS FLUSHING? Flushing helps keep the port from getting clogged. Follow your health care provider's instructions on how and when to flush the port. Ports are usually flushed with saline solution or a medicine called heparin. The need for flushing will depend on how the port is used.  18. If the port is used for intermittent medicines or blood draws, the port will need to be flushed:   After medicines have been given.   After blood has been drawn.   As part of routine maintenance.  19. If a constant infusion is running, the port may not need to be flushed.  HOW LONG WILL MY PORT STAY IMPLANTED? The port can stay in for as long as your health care provider thinks it is needed. When it is time for the port to come out, surgery will be done to remove it.  The procedure is similar to the one performed when the port was put in.  WHEN SHOULD I SEEK IMMEDIATE MEDICAL CARE? When you have an implanted port, you should seek immediate medical care if:   You notice a bad smell coming from the incision site.   You have swelling, redness, or drainage at the incision site.   You have more swelling or pain at the port site or the surrounding area.   You have a fever that is not controlled with medicine.   This information is not intended to replace advice given to you by your health care provider. Make sure you discuss any questions you have with your health care provider.   Document Released: 09/12/2005 Document Revised: 07/03/2013 Document Reviewed:  05/20/2013 Elsevier Interactive Patient Education 2016 Elsevier Inc[employer] Moderate Conscious Sedation, Adult Sedation is the use of medicines to promote relaxation and relieve discomfort and anxiety. Moderate conscious sedation is a type of sedation. Under moderate conscious sedation you are less alert than normal but are still able to respond to instructions or stimulation. Moderate conscious sedation is used during short medical and dental procedures. It is milder than deep sedation or general anesthesia and allows you to return to your regular activities sooner. LET [facility] CARE PROVIDER KNOW ABOUT:   Any allergies you have.  All medicines you are taking, including vitamins, herbs, eye drops, creams, and over-the-counter medicines.  Use of steroids (by mouth or creams).  Previous problems you or members of your family have had with the use of anesthetics.  Any blood disorders you have.  Previous surgeries you have had.  Medical conditions you have.  Possibility of pregnancy, if this applies.  Use of cigarettes, alcohol, or illegal drugs. RISKS AND COMPLICATIONS Generally, this is a safe procedure. However, as with any procedure, problems can occur. Possible problems include: 28. Oversedation. 29. Trouble breathing on your own. You may need to have a breathing tube until you are awake and breathing on your own. 30. Allergic reaction to any of the medicines used for the procedure. BEFORE THE PROCEDURE  You may have blood tests done. These tests can help show how well your kidneys and liver are working. They can also show how well your blood clots.  A physical exam will be done.  Only take medicines as directed by your health care provider. You may need to stop taking medicines (such as blood thinners, aspirin, or nonsteroidal anti-inflammatory drugs) before the procedure.   Do not eat or drink at least 6 hours before the procedure or as directed by your health care  provider.  Arrange for a responsible adult, family member, or friend to take you home after the procedure. He or she should stay with you for at least 24 hours after the procedure, until the medicine has worn off. PROCEDURE  20. An intravenous (IV) catheter will be inserted into one of your veins. Medicine will be able to flow directly into your body through this catheter. You may be given medicine through this tube to help prevent pain and help you relax. 21. The medical or dental procedure will be done. AFTER THE PROCEDURE  You will stay in a recovery area until the medicine has worn off. Your blood pressure and pulse will be checked.   Depending on the procedure you had, you may be allowed to go home when you can tolerate liquids and your pain is under control.   This information is not intended to replace advice given to  you by your health care provider. Make sure you discuss any questions you have with your health care provider.   Document Released: 06/07/2001 Document Revised: 10/03/2014 Document Reviewed: 05/20/2013 Elsevier Interactive Patient Education [employer].

## 2015-11-06 ENCOUNTER — Telehealth: Payer: Self-pay | Admitting: Radiation Oncology

## 2015-11-06 ENCOUNTER — Other Ambulatory Visit: Payer: Self-pay | Admitting: *Deleted

## 2015-11-06 ENCOUNTER — Telehealth: Payer: Self-pay | Admitting: *Deleted

## 2015-11-06 ENCOUNTER — Encounter: Payer: Self-pay | Admitting: Radiation Oncology

## 2015-11-06 ENCOUNTER — Other Ambulatory Visit: Payer: Self-pay | Admitting: Hematology and Oncology

## 2015-11-06 DIAGNOSIS — C01 Malignant neoplasm of base of tongue: Secondary | ICD-10-CM

## 2015-11-06 DIAGNOSIS — Z510 Encounter for antineoplastic radiation therapy: Secondary | ICD-10-CM | POA: Diagnosis not present

## 2015-11-06 MED ORDER — OXYCODONE-ACETAMINOPHEN 10-325 MG PO TABS: 1.0000 | ORAL_TABLET | ORAL | Status: DC | PRN

## 2015-11-06 MED ORDER — OXYCODONE HCL 20 MG/ML PO CONC
10.0000 mg | ORAL | Status: DC | PRN
Start: 2015-11-06 — End: 2015-11-06

## 2015-11-06 MED FILL — OXYCODONE-APAP 10-325 TAB: 10-325 | 10 days supply | Qty: 60 | Fill #0

## 2015-11-06 NOTE — Telephone Encounter (Signed)
Pt states PEG tube insertion site causing a lot more pain than she anticipated.  She has some Hydrocodone 5-325 mg at home and thought this would be enough for the pain but it not.  She doesn't have enough Hydrocodone to take for this level of pain.  Pt says site does not look infected.  The pain is so bad it is difficult to move/ bend to use the bathroom.  Notified Dr. Alvy Bimler and informed pt of new Rx for increased dose of pain medication ready to pick up.   She will have her daughter, Debra Farrell, pick it up today before 4:30 pm.

## 2015-11-06 NOTE — Telephone Encounter (Signed)
Pt's daughter, Abigail Butts, came by to pick up Rx for pt.  She told injection RN that pt needed a liquid due to difficulty swallowing pills since all her teeth had been pulled.  Printed Rx for oxycodone liquid,  But Dr. Alvy Bimler says she thinks the liquid formulations are expensive and it would be less expensive for pt to just crush the Percocet pills and put in her feeding tube.  Liquid oxycodone may also require a P.A. And that can take a few days.  Dr. Alvy Bimler wants to give pt something stronger than the Hydrocodone since pt says that is not working.  Went to lobby and discussed above with daughter.  She says pt will be ok to crush the percocet.   Printed new Percocet Rx for Dr. Calton Dach signature (had already ripped up the first one).  Rx given to pt's daughter in lobby.

## 2015-11-06 NOTE — Progress Notes (Signed)
Paperwork received from doctor, faxed to [facility] @ [phone], confirmation received, 11/06/15 Ardeen Fillers)

## 2015-11-06 NOTE — Telephone Encounter (Signed)
Spoke with pt, she adv to fax paperwork and hold a copy for her when she comes in, 11/06/15 Debra Farrell)

## 2015-11-09 DIAGNOSIS — Z510 Encounter for antineoplastic radiation therapy: Secondary | ICD-10-CM | POA: Diagnosis not present

## 2015-11-09 NOTE — Progress Notes (Signed)
  Oncology Nurse Navigator Documentation  Navigator Location: [facility] Onc (11/05/15 1530) Navigator Encounter Type: Education (11/05/15 1530)           Patient Visit Type: Inpatient (11/05/15 1530)       Met with Ms. Drinkard in [place] Stay following he PAC and PEG placement  Her dtr was at the bedside. I educated her again about PEG care, initiation of daily flushes 24 hrs post-placement and initiation of clear liquids per MD guidance. She stated she had necessary medications to manage post-procedure pain. She verbalized understanding that I can be contacted with needs/concerns.  Gayleen Orem, RN, BSN, [place] [facility] [place] [phone]                            Time Spent with Patient: 30 (11/05/15 1530)

## 2015-11-10 ENCOUNTER — Encounter (HOSPITAL_COMMUNITY): Payer: Self-pay | Admitting: Dentistry

## 2015-11-10 ENCOUNTER — Encounter: Payer: Self-pay | Admitting: Hematology and Oncology

## 2015-11-10 ENCOUNTER — Ambulatory Visit (HOSPITAL_BASED_OUTPATIENT_CLINIC_OR_DEPARTMENT_OTHER): Payer: 59 | Admitting: Hematology and Oncology

## 2015-11-10 ENCOUNTER — Ambulatory Visit (HOSPITAL_COMMUNITY): Payer: Self-pay | Admitting: Dentistry

## 2015-11-10 ENCOUNTER — Encounter: Payer: Self-pay | Admitting: *Deleted

## 2015-11-10 ENCOUNTER — Telehealth: Payer: Self-pay | Admitting: Hematology and Oncology

## 2015-11-10 VITALS — BP 155/61 | HR 77 | Temp 98.5°F

## 2015-11-10 VITALS — BP 162/75 | HR 79 | Temp 98.2°F | Resp 18 | Ht 66.0 in | Wt 161.7 lb

## 2015-11-10 DIAGNOSIS — T402X5A Adverse effect of other opioids, initial encounter: Secondary | ICD-10-CM | POA: Insufficient documentation

## 2015-11-10 DIAGNOSIS — C01 Malignant neoplasm of base of tongue: Secondary | ICD-10-CM

## 2015-11-10 DIAGNOSIS — K5903 Drug induced constipation: Secondary | ICD-10-CM | POA: Diagnosis not present

## 2015-11-10 DIAGNOSIS — I10 Essential (primary) hypertension: Secondary | ICD-10-CM | POA: Diagnosis not present

## 2015-11-10 DIAGNOSIS — R208 Other disturbances of skin sensation: Secondary | ICD-10-CM

## 2015-11-10 DIAGNOSIS — L7682 Other postprocedural complications of skin and subcutaneous tissue: Secondary | ICD-10-CM | POA: Insufficient documentation

## 2015-11-10 DIAGNOSIS — Z01818 Encounter for other preprocedural examination: Secondary | ICD-10-CM

## 2015-11-10 DIAGNOSIS — K0820 Unspecified atrophy of edentulous alveolar ridge: Secondary | ICD-10-CM

## 2015-11-10 DIAGNOSIS — K08109 Complete loss of teeth, unspecified cause, unspecified class: Secondary | ICD-10-CM

## 2015-11-10 MED ORDER — DEXAMETHASONE 4 MG PO TABS: ORAL_TABLET | ORAL | Status: DC

## 2015-11-10 MED ORDER — LIDOCAINE-PRILOCAINE 2.5-2.5 % EX CREA: TOPICAL_CREAM | CUTANEOUS | Status: DC

## 2015-11-10 MED ORDER — ONDANSETRON HCL 8 MG PO TABS: 8.0000 mg | ORAL_TABLET | Freq: Two times a day (BID) | ORAL | Status: DC | PRN

## 2015-11-10 MED ORDER — PROCHLORPERAZINE MALEATE 10 MG PO TABS: 10.0000 mg | ORAL_TABLET | Freq: Four times a day (QID) | ORAL | Status: DC | PRN

## 2015-11-10 MED FILL — LIDOCAINE-PRILOCAINE CREAM: 2.5-2.5 | 15 days supply | Qty: 30 | Fill #0

## 2015-11-10 MED FILL — ONDANSETRON HCL 8 MG TABLET: 8 | 15 days supply | Qty: 30 | Fill #0

## 2015-11-10 MED FILL — DEXAMETHASONE 4 MG TABLET: 4 | 30 days supply | Qty: 30 | Fill #0

## 2015-11-10 MED FILL — PROCHLORPERAZINE 10 MG TAB: 10 | 8 days supply | Qty: 30 | Fill #0

## 2015-11-10 NOTE — Progress Notes (Signed)
Introduced myself as her FA.  Informed her that unfortunately at this time there aren't any foundations that offer copay assistance for her Dx, however, she can apply for the [place] and went over what the grant will and will not cover.  She will bring in her bank statement tomorrow to see if she qualifies.  She has my card for any questions or concerns she may have in the future.

## 2015-11-10 NOTE — Assessment & Plan Note (Signed)
-   she had significant postoperative pain.  I recommend she increase her current prescription medication for pain to be used only on short-term basis. I also recommend a trial of topical lidocaine cream at the surgical site

## 2015-11-10 NOTE — Assessment & Plan Note (Signed)
Her blood pressure is high today, likely aggravated by severe pain. I recommend she increase her pain medication as above Due to risk of dehydration, I recommend she reduce her risk of hydrochlorothiazide starting tomorrow by half

## 2015-11-10 NOTE — Progress Notes (Signed)
POST OPERATIVE NOTE:  11/10/2015 Debra Farrell [id number]  VITALS: BP 155/61 mmHg  Pulse 77  Temp(Src) 98.5 F (36.9 C) (Oral)  LABS:  Lab Results  Component Value Date   WBC 6.3 11/05/2015   HGB 10.3* 11/05/2015   HCT 33.4* 11/05/2015   MCV 62.9* 11/05/2015   PLT 298 11/05/2015   BMET    Component Value Date/Time   NA 141 11/05/2015 1212   K 3.7 11/05/2015 1212   CL 104 11/05/2015 1212   CO2 25 11/05/2015 1212   GLUCOSE 84 11/05/2015 1212   BUN 12 11/05/2015 1212   CREATININE 0.83 11/05/2015 1212   CREATININE 1.04 04/07/2015 1536   CALCIUM 9.7 11/05/2015 1212   GFRNONAA >60 11/05/2015 1212   GFRAA >60 11/05/2015 1212    Lab Results  Component Value Date   INR 1.18 11/05/2015   INR 1.08 10/27/2015   No results found for: PTT   Debra Farrell is status post extraction remaining teeth with alveoloplasty and pre-prosthetic surgery as needed in the operating room on 10/28/2015. The patient now presents for evaluation of healing and suture removal as needed..  SUBJECTIVE: Patient denies having any problems with dental pain. Patient is eating softer foods without problems. Patient denies having any stitches that remain.   EXAM: There is no sign of infection, heme, or ooze. There are no sutures. Patient is healing in by generalized primary closure. There is atrophy of the edentulous alveolar ridges. Patient is now completely edentulous.  PROCEDURE: The patient was given a chlorhexidine gluconate rinse for 30 seconds. No sutures remain.   ASSESSMENT: Post operative course is consistent with dental procedures performed in the OR. Patient is edentulous. There is atrophy of the edentulous alveolar ridges that may require prosthodontics referral.  PLAN: 1. Use salt water rinses as needed aid healing. 2. Brush tongue daily. 3. Perform trismus exercises as directed.  4. Advance diet as tolerated.   Lenn Cal, DDS

## 2015-11-10 NOTE — Assessment & Plan Note (Signed)
We discussed the role of chemotherapy. The intent is for cure.  We discussed some of the risks, benefits, side-effects of cisplatin. Some of the short term side-effects included, though not limited to, including weight loss, life threatening infections, risk of allergic reactions, need for transfusions of blood products, nausea, vomiting, change in bowel habits, loss of hair, admission to hospital for various reasons, and risks of death.   Long term side-effects are also discussed including risks of infertility, permanent damage to nerve function, hearing loss, chronic fatigue, kidney damage with possibility needing hemodialysis, and rare secondary malignancy including bone marrow disorders.  The patient is aware that the response rates discussed earlier is not guaranteed.  After a long discussion, patient made an informed decision to proceed with the prescribed plan of care  Patient education material was dispensed.  I will see the patient on a weekly basis after the start date of treatment.

## 2015-11-10 NOTE — Patient Instructions (Signed)
1. Continue salt water rinses as needed to aid healing. 2. Brush tongue daily. 3. Advance diet as tolerated or use feeding tube as directed. 4. Use trismus exercises as directed.  Dr. Enrique Sack

## 2015-11-10 NOTE — Assessment & Plan Note (Signed)
We discussed management for constipation and I recommend she use aggressive laxative therapy for now

## 2015-11-10 NOTE — Progress Notes (Signed)
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 3 cm right tongue base mass consistent with malignancy.2. Necrotic right level II and III nodal metastases.3. Enlarged right thyroid lobe containing multiple nodules.   10/14/2015 Imaging PET scan showed 1. Hypermetabolic right tongue base mass with hypermetabolic right level II and III adenopathy. Small left level III lymph node appears mildly hypermetabolic as well.2. A few scattered tiny pulmonary nodules are too small for PETresolution   10/16/2015 Imaging [place] Head/Neck:  1) Multiple thyroid lesions bilaterally; dominant nodule in right lower pole measures 22 mm and meets fine-needle aspiration criteria. 2) Complex mass within the soft tissues of the right side of neck worrisome for metastatic malignancy.   10/26/2015 Pathology Results Accession [id number]:  Thyroid FNA - Consistent with benign follicular nodule.   10/28/2015 Surgery Multiple tooth extractions (26), right tongue base biopsy.   10/28/2015 Pathology Results Accession [id number]:  Tongue, right base -  Infiltrative SCC, p16 positive.  Actinomyces infection.   11/05/2015 Procedure Port-a-cath and PEG placed.     INTERVAL HISTORY: Please see below for problem oriented charting.  she is seen prior to start date of chemotherapy. She complained of severe incisional pain,  Rated her pain at 9 out of 10. She felt that the pain medication will help to reduce it by only last for about 2 hours. She is afraid to take pain medication because she is "not a pill taker". She developed severe constipation recently and has not have a bowel movement for 1 week. She denies nausea.  REVIEW OF SYSTEMS:   Constitutional: Denies fevers, chills  Eyes: Denies blurriness of vision Ears, nose, mouth, throat, and face: Denies mucositis or sore throat Respiratory: Denies cough, dyspnea or wheezes Cardiovascular: Denies palpitation, chest discomfort or lower extremity swelling Gastrointestinal:  Denies nausea, heartburn or change in bowel habits Skin: Denies abnormal skin rashes Lymphatics: Denies new lymphadenopathy or easy bruising Neurological:Denies numbness, tingling or new weaknesses Behavioral/Psych: Mood is stable, no new changes  All other systems were reviewed with the patient and are negative.  I have reviewed the past medical history, past surgical history, social history and family history with the patient and they are unchanged from previous note.  ALLERGIES:  is allergic to morphine and related.  MEDICATIONS:  Current Outpatient Prescriptions  Medication Sig Dispense Refill  . hydrochlorothiazide (HYDRODIURIL) 25 MG tablet Take 25 mg by mouth daily.    Marland Kitchen ibuprofen (ADVIL,MOTRIN) 200 MG tablet Take 400 mg by mouth every 6 (six) hours as needed for moderate pain.    Marland Kitchen oxyCODONE-acetaminophen (PERCOCET) 10-325 MG tablet Take 1 tablet by mouth every 4 (four) hours as needed for pain. 60 tablet 0  .  dexamethasone (DECADRON) 4 MG tablet Take 2 tablets by mouth once a day on the day after chemotherapy and then take 2 tablets two times a day for 2 days. Take with food. 30 tablet 1  . lidocaine-prilocaine  (EMLA) cream Apply to affected area once 30 g 3  . ondansetron (ZOFRAN) 8 MG tablet Take 1 tablet (8 mg total) by mouth 2 (two) times daily as needed. Start on the third day after chemotherapy. 30 tablet 1  . prochlorperazine (COMPAZINE) 10 MG tablet Take 1 tablet (10 mg total) by mouth every 6 (six) hours as needed (Nausea or vomiting). 30 tablet 1   No current facility-administered medications for this visit.    PHYSICAL EXAMINATION: ECOG PERFORMANCE STATUS: 1 - Symptomatic but completely ambulatory  Filed Vitals:   11/10/15 1143  BP: 162/75  Pulse: 79  Temp: 98.2 F (36.8 C)  Resp: 18   Filed Weights   11/10/15 1143  Weight: 161 lb 11.2 oz (73.347 kg)    GENERAL:alert, no distress and comfortable SKIN: skin color, texture, turgor are normal, no rashes or significant lesions EYES: normal, Conjunctiva are pink and non-injected, sclera clear OROPHARYNX:no exudate, no erythema and lips, buccal mucosa, and tongue normal  NECK: supple, thyroid normal size, non-tender, without nodularity LYMPH:  no palpable lymphadenopathy in the cervical, axillary or inguinal LUNGS: clear to auscultation and percussion with normal breathing effort HEART: regular rate & rhythm and no murmurs and no lower extremity edema ABDOMEN:abdomen soft, non-tender and normal bowel sounds.  Feeding tube in situ Musculoskeletal:no cyanosis of digits and no clubbing  NEURO: alert & oriented x 3 with fluent speech, no focal motor/sensory deficits  LABORATORY DATA:  I have reviewed the data as listed    Component Value Date/Time   NA 141 11/05/2015 1212   K 3.7 11/05/2015 1212   CL 104 11/05/2015 1212   CO2 25 11/05/2015 1212   GLUCOSE 84 11/05/2015 1212   BUN 12 11/05/2015 1212   CREATININE 0.83 11/05/2015 1212   CREATININE 1.04 04/07/2015 1536   CALCIUM 9.7 11/05/2015 1212   PROT 7.2 11/05/2015 1212   ALBUMIN 4.3 11/05/2015 1212   AST 30 11/05/2015 1212   ALT 29 11/05/2015 1212   ALKPHOS 92  11/05/2015 1212   BILITOT 1.0 11/05/2015 1212   GFRNONAA >60 11/05/2015 1212   GFRAA >60 11/05/2015 1212    No results found for: SPEP, UPEP  Lab Results  Component Value Date   WBC 6.3 11/05/2015   NEUTROABS 4.4 11/05/2015   HGB 10.3* 11/05/2015   HCT 33.4* 11/05/2015   MCV 62.9* 11/05/2015   PLT 298 11/05/2015      Chemistry      Component Value Date/Time   NA 141 11/05/2015 1212   K 3.7 11/05/2015 1212   CL 104 11/05/2015 1212   CO2 25 11/05/2015 1212   BUN 12 11/05/2015 1212   CREATININE 0.83 11/05/2015 1212   CREATININE 1.04 04/07/2015 1536      Component Value Date/Time   CALCIUM 9.7 11/05/2015 1212   ALKPHOS 92 11/05/2015 1212   AST 30 11/05/2015 1212   ALT 29 11/05/2015 1212   BILITOT 1.0 11/05/2015 1212     ASSESSMENT & PLAN:  Cancer of base of tongue (HCC) We discussed the role of chemotherapy. The intent is for cure.  We discussed some of the risks, benefits, side-effects of cisplatin. Some of the short term side-effects included, though not limited to, including weight loss, life threatening infections, risk of  allergic reactions, need for transfusions of blood products, nausea, vomiting, change in bowel habits, loss of hair, admission to hospital for various reasons, and risks of death.   Long term side-effects are also discussed including risks of infertility, permanent damage to nerve function, hearing loss, chronic fatigue, kidney damage with possibility needing hemodialysis, and rare secondary malignancy including bone marrow disorders.  The patient is aware that the response rates discussed earlier is not guaranteed.  After a long discussion, patient made an informed decision to proceed with the prescribed plan of care  Patient education material was dispensed.  I will see the patient on a weekly basis after the start date of treatment.   Pain at surgical incision - she had significant postoperative pain.  I recommend she increase her current  prescription medication for pain to be used only on short-term basis. I also recommend a trial of topical lidocaine cream at the surgical site  Constipation due to opioid therapy  We discussed management for constipation and I recommend she use aggressive laxative therapy for now  Essential hypertension  Her blood pressure is high today, likely aggravated by severe pain. I recommend she increase her pain medication as above Due to risk of dehydration, I recommend she reduce her risk of hydrochlorothiazide starting tomorrow by half    No orders of the defined types were placed in this encounter.   All questions were answered. The patient knows to call the clinic with any problems, questions or concerns. No barriers to learning was detected. I spent 25 minutes counseling the patient face to face. The total time spent in the appointment was 40 minutes and more than 50% was on counseling and review of test results     [facility], [place], MD 11/10/2015 1:56 PM

## 2015-11-10 NOTE — Telephone Encounter (Signed)
per pof to sch pt appt-gave pt copy of avs °

## 2015-11-11 ENCOUNTER — Ambulatory Visit: Payer: 59 | Admitting: Nutrition

## 2015-11-11 ENCOUNTER — Ambulatory Visit (HOSPITAL_BASED_OUTPATIENT_CLINIC_OR_DEPARTMENT_OTHER): Payer: 59

## 2015-11-11 ENCOUNTER — Ambulatory Visit
Admission: RE | Admit: 2015-11-11 | Discharge: 2015-11-11 | Disposition: A | Discharge status: Home / Self Care | Payer: 59 | Source: Ambulatory Visit | Attending: Radiation Oncology | Admitting: Radiation Oncology

## 2015-11-11 ENCOUNTER — Other Ambulatory Visit: Payer: Self-pay | Admitting: Hematology and Oncology

## 2015-11-11 ENCOUNTER — Telehealth: Payer: Self-pay | Admitting: Hematology and Oncology

## 2015-11-11 ENCOUNTER — Encounter: Payer: Self-pay | Admitting: *Deleted

## 2015-11-11 VITALS — BP 145/61 | HR 93 | Temp 98.4°F | Resp 18

## 2015-11-11 DIAGNOSIS — Z510 Encounter for antineoplastic radiation therapy: Secondary | ICD-10-CM | POA: Diagnosis not present

## 2015-11-11 DIAGNOSIS — C01 Malignant neoplasm of base of tongue: Secondary | ICD-10-CM | POA: Diagnosis not present

## 2015-11-11 DIAGNOSIS — Z5111 Encounter for antineoplastic chemotherapy: Secondary | ICD-10-CM

## 2015-11-11 MED ORDER — PALONOSETRON HCL INJECTION 0.25 MG/5ML
0.2500 mg | Freq: Once | INTRAVENOUS | Status: AC
  Administered 2015-11-11: 0.25 mg via INTRAVENOUS

## 2015-11-11 MED ORDER — POTASSIUM CHLORIDE 2 MEQ/ML IV SOLN
Freq: Once | INTRAVENOUS | Status: AC
  Administered 2015-11-11: 10:00:00 via INTRAVENOUS
  Filled 2015-11-11: qty 10

## 2015-11-11 MED ORDER — SODIUM CHLORIDE 0.9 % IV SOLN
100.0000 mg/m2 | Freq: Once | INTRAVENOUS | Status: AC
  Administered 2015-11-11: 185 mg via INTRAVENOUS
  Filled 2015-11-11: qty 185

## 2015-11-11 MED ORDER — HEPARIN SOD (PORK) LOCK FLUSH 100 UNIT/ML IV SOLN
500.0000 [IU] | Freq: Once | INTRAVENOUS | Status: AC | PRN
  Administered 2015-11-11: 500 [IU]
  Filled 2015-11-11: qty 5

## 2015-11-11 MED ORDER — PALONOSETRON HCL INJECTION 0.25 MG/5ML
INTRAVENOUS | Status: AC
  Filled 2015-11-11: qty 5

## 2015-11-11 MED ORDER — FOSAPREPITANT DIMEGLUMINE INJECTION 150 MG
Freq: Once | INTRAVENOUS | Status: AC
  Administered 2015-11-11: 12:00:00 via INTRAVENOUS
  Filled 2015-11-11: qty 5

## 2015-11-11 MED ORDER — SODIUM CHLORIDE 0.9% FLUSH
10.0000 mL | INTRAVENOUS | Status: DC | PRN
  Administered 2015-11-11: 10 mL
  Filled 2015-11-11: qty 10

## 2015-11-11 NOTE — Progress Notes (Signed)
Nutrition follow-up completed with patient in chemotherapy for tongue cancer. Patient is receiving her first dose of cisplatin. Weight documented as 161.7 pounds February 14 decreased from 165.5 pounds January 20 and UBW of 175 pounds.. Weight has been relatively stable for several weeks. Patient complains of pain after PEG placed on February 9. Continues to consume limited diet secondary to likes and dislikes. Reports she flushes her feeding tube daily with water with no difficulty.  Nutrition diagnosis:  Predicted suboptimal energy intake has evolved into inadequate oral intake related to tongue cancer and associated treatments as evidenced by 4 pound weight loss over 3 weeks.  Intervention: I educated patient to consume small frequent meals and snacks and reviewed high protein foods with her. Encouraged patient to drink milkshakes or smoothies for additional calories and protein, since her diet is very limited. Recommended patient continue flushing feeding tube with water daily. Encouraged patient to infuse one can of Ensure Plus or equivalent via feeding tube if oral intake has decreased during the day. Provided samples of protein powder for patient to add to foods and smoothies. Questions were answered.  Teach back method used.  Monitoring, evaluation, goals: Patient will work to increase oral intake of high-calorie, high-protein foods to minimize further weight loss.  Next visit: Friday, February 24 after radiation treatment.  **Disclaimer: This note was dictated with voice recognition software. Similar sounding words can inadvertently be transcribed and this note may contain transcription errors which may not have been corrected upon publication of note.**

## 2015-11-11 NOTE — Progress Notes (Signed)
IMRT Device Note    ICD-9-CM ICD-10-CM   1. Cancer of base of tongue (HCC) 141.0 C01     10.3 delivered field widths represent one set of IMRT treatment devices. The code is [phone].  -----------------------------------  [name], MD

## 2015-11-11 NOTE — Patient Instructions (Signed)
[place] Discharge Instructions for Patients Receiving Chemotherapy  Today you received the following chemotherapy agents:  Cisplatin  To help prevent nausea and vomiting after your treatment, we encourage you to take your nausea medication as prescribed.   If you develop nausea and vomiting that is not controlled by your nausea medication, call the clinic.   BELOW ARE SYMPTOMS THAT SHOULD BE REPORTED IMMEDIATELY:  *FEVER GREATER THAN 100.5 F  *CHILLS WITH OR WITHOUT FEVER  NAUSEA AND VOMITING THAT IS NOT CONTROLLED WITH YOUR NAUSEA MEDICATION  *UNUSUAL SHORTNESS OF BREATH  *UNUSUAL BRUISING OR BLEEDING  TENDERNESS IN MOUTH AND THROAT WITH OR WITHOUT PRESENCE OF ULCERS  *URINARY PROBLEMS  *BOWEL PROBLEMS  UNUSUAL RASH Items with * indicate a potential emergency and should be followed up as soon as possible.  Feel free to call the clinic you have any questions or concerns. The clinic phone number is [phone].  Please show the [place] at check-in to the Emergency Department and triage nurse.  Cisplatin injection What is this medicine? CISPLATIN (SIS pla tin) is a chemotherapy drug. It targets fast dividing cells, like cancer cells, and causes these cells to die. This medicine is used to treat many types of cancer like bladder, ovarian, and testicular cancers. This medicine may be used for other purposes; ask your health care provider or pharmacist if you have questions. What should I tell my health care provider before I take this medicine? They need to know if you have any of these conditions: -blood disorders -hearing problems -kidney disease -recent or ongoing radiation therapy -an unusual or allergic reaction to cisplatin, carboplatin, other chemotherapy, other medicines, foods, dyes, or preservatives -pregnant or trying to get pregnant -breast-feeding How should I use this medicine? This drug is given as an infusion into a vein. It is  administered in a hospital or clinic by a specially trained health care professional. Talk to your pediatrician regarding the use of this medicine in children. Special care may be needed. Overdosage: If you think you have taken too much of this medicine contact a poison control center or emergency room at once. NOTE: This medicine is only for you. Do not share this medicine with others. What if I miss a dose? It is important not to miss a dose. Call your doctor or health care professional if you are unable to keep an appointment. What may interact with this medicine? -dofetilide -foscarnet -medicines for seizures -medicines to increase blood counts like filgrastim, pegfilgrastim, sargramostim -probenecid -pyridoxine used with altretamine -rituximab -some antibiotics like amikacin, gentamicin, neomycin, polymyxin B, streptomycin, tobramycin -sulfinpyrazone -vaccines -zalcitabine Talk to your doctor or health care professional before taking any of these medicines: -acetaminophen -aspirin -ibuprofen -ketoprofen -naproxen This list may not describe all possible interactions. Give your health care provider a list of all the medicines, herbs, non-prescription drugs, or dietary supplements you use. Also tell them if you smoke, drink alcohol, or use illegal drugs. Some items may interact with your medicine. What should I watch for while using this medicine? Your condition will be monitored carefully while you are receiving this medicine. You will need important blood work done while you are taking this medicine. This drug may make you feel generally unwell. This is not uncommon, as chemotherapy can affect healthy cells as well as cancer cells. Report any side effects. Continue your course of treatment even though you feel ill unless your doctor tells you to stop. In some cases, you may be  given additional medicines to help with side effects. Follow all directions for their use. Call your doctor  or health care professional for advice if you get a fever, chills or sore throat, or other symptoms of a cold or flu. Do not treat yourself. This drug decreases your body's ability to fight infections. Try to avoid being around people who are sick. This medicine may increase your risk to bruise or bleed. Call your doctor or health care professional if you notice any unusual bleeding. Be careful brushing and flossing your teeth or using a toothpick because you may get an infection or bleed more easily. If you have any dental work done, tell your dentist you are receiving this medicine. Avoid taking products that contain aspirin, acetaminophen, ibuprofen, naproxen, or ketoprofen unless instructed by your doctor. These medicines may hide a fever. Do not become pregnant while taking this medicine. Women should inform their doctor if they wish to become pregnant or think they might be pregnant. There is a potential for serious side effects to an unborn child. Talk to your health care professional or pharmacist for more information. Do not breast-feed an infant while taking this medicine. Drink fluids as directed while you are taking this medicine. This will help protect your kidneys. Call your doctor or health care professional if you get diarrhea. Do not treat yourself. What side effects may I notice from receiving this medicine? Side effects that you should report to your doctor or health care professional as soon as possible: -allergic reactions like skin rash, itching or hives, swelling of the face, lips, or tongue -signs of infection - fever or chills, cough, sore throat, pain or difficulty passing urine -signs of decreased platelets or bleeding - bruising, pinpoint red spots on the skin, black, tarry stools, nosebleeds -signs of decreased red blood cells - unusually weak or tired, fainting spells, lightheadedness -breathing problems -changes in hearing -gout pain -low blood counts - This drug may  decrease the number of white blood cells, red blood cells and platelets. You may be at increased risk for infections and bleeding. -nausea and vomiting -pain, swelling, redness or irritation at the injection site -pain, tingling, numbness in the hands or feet -problems with balance, movement -trouble passing urine or change in the amount of urine Side effects that usually do not require medical attention (report to your doctor or health care professional if they continue or are bothersome): -changes in vision -loss of appetite -metallic taste in the mouth or changes in taste This list may not describe all possible side effects. Call your doctor for medical advice about side effects. You may report side effects to FDA at 1-800-FDA-1088. Where should I keep my medicine? This drug is given in a hospital or clinic and will not be stored at home. NOTE: This sheet is a summary. It may not cover all possible information. If you have questions about this medicine, talk to your doctor, pharmacist, or health care provider.    2016, [employer]. (2007-12-18 14:40:54)

## 2015-11-11 NOTE — Telephone Encounter (Signed)
per pof to sch pt appt-pt to get updated copy sch on MY CHART per pt

## 2015-11-12 ENCOUNTER — Ambulatory Visit: Payer: 59

## 2015-11-12 ENCOUNTER — Ambulatory Visit
Admission: RE | Admit: 2015-11-12 | Discharge: 2015-11-12 | Disposition: A | Discharge status: Home / Self Care | Payer: 59 | Source: Ambulatory Visit | Attending: Radiation Oncology | Admitting: Radiation Oncology

## 2015-11-12 DIAGNOSIS — D563 Thalassemia minor: Secondary | ICD-10-CM | POA: Diagnosis not present

## 2015-11-12 DIAGNOSIS — Z510 Encounter for antineoplastic radiation therapy: Secondary | ICD-10-CM | POA: Diagnosis not present

## 2015-11-12 DIAGNOSIS — I10 Essential (primary) hypertension: Secondary | ICD-10-CM | POA: Diagnosis not present

## 2015-11-12 DIAGNOSIS — Z809 Family history of malignant neoplasm, unspecified: Secondary | ICD-10-CM | POA: Diagnosis not present

## 2015-11-12 DIAGNOSIS — E785 Hyperlipidemia, unspecified: Secondary | ICD-10-CM | POA: Diagnosis not present

## 2015-11-12 DIAGNOSIS — C01 Malignant neoplasm of base of tongue: Secondary | ICD-10-CM | POA: Diagnosis not present

## 2015-11-12 DIAGNOSIS — L309 Dermatitis, unspecified: Secondary | ICD-10-CM | POA: Diagnosis not present

## 2015-11-12 MED ORDER — SONAFINE EX EMUL
1.0000 "application " | Freq: Once | CUTANEOUS | Status: AC
  Administered 2015-11-12: 1 via TOPICAL
  Filled 2015-11-12: qty 45

## 2015-11-12 NOTE — Addendum Note (Signed)
Encounter addended by: Neysa Hotter, [facility] on: 11/12/2015 10:25 AM<BR>     Documentation filed: Rx Order Verification

## 2015-11-12 NOTE — Addendum Note (Signed)
Encounter addended by: Ernst Spell, RN on: 11/12/2015 11:04 AM<BR>     Documentation filed: Inpatient MAR

## 2015-11-12 NOTE — Progress Notes (Addendum)
Met with Ms Jantz following her initial Tomo XRT.  She was accompanied by her dtr.   She reported successful completion of tmt, voiced confusion about "going in and out of machine twice".  I re-educated her on the 2-step tmt procedure.   I encouraged dtr to watch tmt procedure next time so she has better understanding of process.  She indicated she would like to do that. I noted to Ms Mathwig that I would check with her in Infusion later today.  Gayleen Orem, RN, BSN, [place] [facility] [place] [phone]

## 2015-11-12 NOTE — Progress Notes (Addendum)
Pt here for patient teaching.  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 Pt reports they have not watched the Radiation Therapy Education video, but were given the link to watch at home.  Reviewed areas of pertinence such as fatigue, mouth changes, skin changes, throat changes, breast tenderness, breast swelling, cough, shortness of breath, earaches and taste changes . Pt able to give teach back of to pat skin, use unscented/gentle soap and drink plenty of water,apply Sonafine bid and avoid applying anything to skin within 4 hours of treatment. Pt verbalizes understanding of information given and will contact nursing with any questions or concerns.     Http://rtanswers.org/treatmentinformation/whattoexpect/index  The patient was given the following worksheet:  Managing Acute Radiation Side Effects for Head and Neck Cancer  Skin irritation:  . Sonafine  Topical Emulsion: First-line topical cream to help soothe skin irritation.  Apply to skin in radiation fields at least 4 hours before radiotherapy, or any time after treatments during the rest of the day.  . Triple Antibiotic Ointment (Neosporin): Apply to areas of skin with moist breakdown to prevent infection.  . 1% hydrocortisone cream: Apply to areas of skin that are itching, up to three times a day.  Arnetha Massy (Silver Sulfadiazine): Used in select cases if large patches of skin develop moist breakdown (let physician or nurse know if you have a "sulfa" drug allergy)  Soreness in mouth or throat: . Baking Soda Rinse: a home remedy to soothe/cleanse mouth and loosen thick saliva.  Mix 1/2 teaspoon salt, 1/2 teaspoon baking soda, 1 pint water (16 oz or two cups).  Swish, gargle and spit as needed to soothe/cleanse mouth. Use as often as you want.  . Sucralfate (Carafate): coats throat to soothe it before meals or any time of day. Crush 1  tablet in 10 mL H20 and swallow up to four times a day.  . 2% viscous Lidocaine (Magic Mouth Wash): Soothes mouth and/or throat by numbing your mucous membranes. Mix 1 part 2% viscous lidocaine (Magic Mouth Wash), 1 part H20. Swish and/or swallow 6m of this mixture, 33m before meals and at bedtime, up to four times a day. Alternate with Sucralfate (Carafate).  . Narcotics: Various short acting and long acting narcotics can be prescribed.  Often, medical oncology will prescribe these if you are receiving chemotherapy concurrently. Narcotics may cause constipation. It may be helpful to take a stool softener (Docusate Sodium) or gentle laxative (ie Senna or Polyethylene Glycol) to prevent constipation.  Having food in your stomach before ingesting a narcotic may reduce risk of stomach upset.  Thick Saliva: . Baking Soda Rinse: a home remedy to soothe/cleanse mouth and loosen thick saliva.  Mix 1/2 teaspoon salt, 1/2 teaspoon baking soda, 1 pint water (16 oz or two cups).  Swish, gargle, and spit as needed to soothe/cleanse mouth. Use as often as you want.  . Some patients find Diet Ginger Ale or Papaya Juice to be helpful.  . In extreme cases, your physician may consider prescribing a Scopolamine transdermal patch which dries up your saliva.     Poor taste, or lack of taste:   . There are no well-established medications to combat taste bud changes from radiotherapy.  It often takes weeks to months to regain taste function.  Eating bland foods and drinking nutritional shakes  may help you maintain your weight when food is not enjoyable.  Some patients supplement their  oral intake with a feeding tube.  Fatigue and weakness: . There is not a well-established safe and effective medication to combat radiation-induced fatigue.  However, if you are able to perform light exercise (such as a daily walk, yoga, recumbent stationary bicycling), this may combat fatigue and help you maintain muscle mass during  treatment.  . Maintaining hydration and nutrition are also important.  If you have not been referred to a nutritionist and would like a referral, please let your nurse or physician know.  . Try to get at least 8 hours of sleep each night. You may need a daily nap, but try not to nap so late that it interferes with your nightly sleep schedule.

## 2015-11-12 NOTE — Progress Notes (Signed)
Met with patient during her initial chemotherapy infusion to provide support and encouragement.  She was accompanied by her dtr. She denied any concerns or needs. She understands I will continue to follow her during upcoming tmts, that she can contact me.  Gayleen Orem, RN, BSN, [place] at [place] [phone]

## 2015-11-12 NOTE — Addendum Note (Signed)
Encounter addended by: Ernst Spell, RN on: 11/12/2015 10:16 AM<BR>     Documentation filed: Dx [employer], Orders

## 2015-11-12 NOTE — Progress Notes (Addendum)
  Oncology Nurse Navigator Documentation  Navigator Location: [facility]-Med Onc (11/10/15 1135) Navigator Encounter Type: Treatment;Follow-up Appt (11/10/15 1135)           Patient Visit Type: MedOnc (11/10/15 1135) Treatment Phase: Pre-Tx/Tx Discussion (11/10/15 1135) Barriers/Navigation Needs: Education (11/10/15 1135)                Acuity: Level 2 (11/10/15 1135)   Acuity Level 2: Educational needs;Initial guidance, education and coordination as needed (11/10/15 1135)     Met with Ms. Cumbie during est pt appt with Dr. Alvy Bimler.  She begins concurrent chemo/RT tomorrow. She reported:  Ongoing abd discomfort at PEG insertion site.  Dr. Alvy Bimler guided to double dose of current pain medication; to apply Lidocaine to area around site but directly at insertion.  She voiced understanding.  Constipation of 3-4 days.  She voiced understanding some of her abdominal discomfort may be attributed to lack of BM.  Dr. Alvy Bimler guided her to begin using OTC laxative such as Miralax.  Drinking 4-5 large tumblers of water daily.  Dr. Alvy Bimler guided her to keep record of daily intake.  Conducting daily PEG flushes and dsg changes without difficulty. She voiced understanding of guidance and information provided.  Gayleen Orem, RN, BSN, [place] at [place] [phone]     Time Spent with Patient: 15 (11/10/15 1135)

## 2015-11-13 ENCOUNTER — Encounter: Payer: Self-pay | Admitting: *Deleted

## 2015-11-13 ENCOUNTER — Telehealth: Payer: Self-pay | Admitting: Hematology and Oncology

## 2015-11-13 ENCOUNTER — Encounter (HOSPITAL_COMMUNITY): Payer: Self-pay

## 2015-11-13 ENCOUNTER — Other Ambulatory Visit: Payer: Self-pay | Admitting: Hematology and Oncology

## 2015-11-13 ENCOUNTER — Encounter: Payer: Self-pay | Admitting: Hematology and Oncology

## 2015-11-13 ENCOUNTER — Ambulatory Visit (HOSPITAL_BASED_OUTPATIENT_CLINIC_OR_DEPARTMENT_OTHER): Payer: 59 | Admitting: Hematology and Oncology

## 2015-11-13 ENCOUNTER — Ambulatory Visit
Admission: RE | Admit: 2015-11-13 | Discharge: 2015-11-13 | Disposition: A | Discharge status: Home / Self Care | Payer: 59 | Source: Ambulatory Visit | Attending: Radiation Oncology | Admitting: Radiation Oncology

## 2015-11-13 ENCOUNTER — Other Ambulatory Visit: Payer: Self-pay | Admitting: *Deleted

## 2015-11-13 ENCOUNTER — Ambulatory Visit: Payer: 59

## 2015-11-13 ENCOUNTER — Inpatient Hospital Stay (HOSPITAL_COMMUNITY)
Admission: AD | Admit: 2015-11-13 | Discharge: 2015-11-30 | DRG: 391 | Disposition: A | Discharge status: Home Health | Payer: 59 | Source: Ambulatory Visit | Attending: Hematology and Oncology | Admitting: Hematology and Oncology

## 2015-11-13 ENCOUNTER — Telehealth: Payer: Self-pay | Admitting: *Deleted

## 2015-11-13 ENCOUNTER — Ambulatory Visit (HOSPITAL_BASED_OUTPATIENT_CLINIC_OR_DEPARTMENT_OTHER): Payer: 59

## 2015-11-13 VITALS — BP 166/79 | HR 72 | Temp 98.2°F | Resp 18

## 2015-11-13 VITALS — BP 179/72 | HR 71 | Temp 98.0°F

## 2015-11-13 DIAGNOSIS — E876 Hypokalemia: Secondary | ICD-10-CM | POA: Diagnosis not present

## 2015-11-13 DIAGNOSIS — R52 Pain, unspecified: Secondary | ICD-10-CM

## 2015-11-13 DIAGNOSIS — R1110 Vomiting, unspecified: Secondary | ICD-10-CM

## 2015-11-13 DIAGNOSIS — K651 Peritoneal abscess: Secondary | ICD-10-CM | POA: Diagnosis not present

## 2015-11-13 DIAGNOSIS — D709 Neutropenia, unspecified: Secondary | ICD-10-CM

## 2015-11-13 DIAGNOSIS — N179 Acute kidney failure, unspecified: Secondary | ICD-10-CM | POA: Diagnosis not present

## 2015-11-13 DIAGNOSIS — E860 Dehydration: Secondary | ICD-10-CM

## 2015-11-13 DIAGNOSIS — D759 Disease of blood and blood-forming organs, unspecified: Secondary | ICD-10-CM | POA: Diagnosis present

## 2015-11-13 DIAGNOSIS — R5081 Fever presenting with conditions classified elsewhere: Secondary | ICD-10-CM | POA: Diagnosis not present

## 2015-11-13 DIAGNOSIS — R112 Nausea with vomiting, unspecified: Secondary | ICD-10-CM

## 2015-11-13 DIAGNOSIS — E43 Unspecified severe protein-calorie malnutrition: Secondary | ICD-10-CM | POA: Diagnosis not present

## 2015-11-13 DIAGNOSIS — L02211 Cutaneous abscess of abdominal wall: Secondary | ICD-10-CM | POA: Diagnosis not present

## 2015-11-13 DIAGNOSIS — K2900 Acute gastritis without bleeding: Secondary | ICD-10-CM | POA: Diagnosis not present

## 2015-11-13 DIAGNOSIS — K1230 Oral mucositis (ulcerative), unspecified: Secondary | ICD-10-CM | POA: Diagnosis not present

## 2015-11-13 DIAGNOSIS — B370 Candidal stomatitis: Secondary | ICD-10-CM | POA: Diagnosis not present

## 2015-11-13 DIAGNOSIS — Z79899 Other long term (current) drug therapy: Secondary | ICD-10-CM

## 2015-11-13 DIAGNOSIS — Z6826 Body mass index (BMI) 26.0-26.9, adult: Secondary | ICD-10-CM

## 2015-11-13 DIAGNOSIS — E785 Hyperlipidemia, unspecified: Secondary | ICD-10-CM | POA: Diagnosis not present

## 2015-11-13 DIAGNOSIS — D6489 Other specified anemias: Secondary | ICD-10-CM

## 2015-11-13 DIAGNOSIS — I10 Essential (primary) hypertension: Secondary | ICD-10-CM

## 2015-11-13 DIAGNOSIS — Z510 Encounter for antineoplastic radiation therapy: Secondary | ICD-10-CM | POA: Diagnosis not present

## 2015-11-13 DIAGNOSIS — Z809 Family history of malignant neoplasm, unspecified: Secondary | ICD-10-CM | POA: Diagnosis not present

## 2015-11-13 DIAGNOSIS — R599 Enlarged lymph nodes, unspecified: Secondary | ICD-10-CM | POA: Diagnosis present

## 2015-11-13 DIAGNOSIS — D509 Iron deficiency anemia, unspecified: Secondary | ICD-10-CM | POA: Diagnosis present

## 2015-11-13 DIAGNOSIS — Z931 Gastrostomy status: Secondary | ICD-10-CM

## 2015-11-13 DIAGNOSIS — L309 Dermatitis, unspecified: Secondary | ICD-10-CM | POA: Diagnosis not present

## 2015-11-13 DIAGNOSIS — C01 Malignant neoplasm of base of tongue: Secondary | ICD-10-CM

## 2015-11-13 DIAGNOSIS — D563 Thalassemia minor: Secondary | ICD-10-CM | POA: Diagnosis not present

## 2015-11-13 DIAGNOSIS — Z90710 Acquired absence of both cervix and uterus: Secondary | ICD-10-CM

## 2015-11-13 DIAGNOSIS — D568 Other thalassemias: Secondary | ICD-10-CM | POA: Diagnosis not present

## 2015-11-13 DIAGNOSIS — K2970 Gastritis, unspecified, without bleeding: Secondary | ICD-10-CM | POA: Diagnosis not present

## 2015-11-13 DIAGNOSIS — F419 Anxiety disorder, unspecified: Secondary | ICD-10-CM | POA: Diagnosis present

## 2015-11-13 DIAGNOSIS — R208 Other disturbances of skin sensation: Secondary | ICD-10-CM | POA: Diagnosis not present

## 2015-11-13 DIAGNOSIS — Z885 Allergy status to narcotic agent status: Secondary | ICD-10-CM

## 2015-11-13 DIAGNOSIS — B9689 Other specified bacterial agents as the cause of diseases classified elsewhere: Secondary | ICD-10-CM | POA: Diagnosis not present

## 2015-11-13 DIAGNOSIS — L7682 Other postprocedural complications of skin and subcutaneous tissue: Secondary | ICD-10-CM

## 2015-11-13 DIAGNOSIS — K9189 Other postprocedural complications and disorders of digestive system: Secondary | ICD-10-CM

## 2015-11-13 DIAGNOSIS — E871 Hypo-osmolality and hyponatremia: Secondary | ICD-10-CM | POA: Diagnosis present

## 2015-11-13 DIAGNOSIS — B9562 Methicillin resistant Staphylococcus aureus infection as the cause of diseases classified elsewhere: Secondary | ICD-10-CM | POA: Diagnosis present

## 2015-11-13 DIAGNOSIS — N281 Cyst of kidney, acquired: Secondary | ICD-10-CM | POA: Diagnosis present

## 2015-11-13 DIAGNOSIS — N19 Unspecified kidney failure: Secondary | ICD-10-CM

## 2015-11-13 DIAGNOSIS — R509 Fever, unspecified: Secondary | ICD-10-CM

## 2015-11-13 DIAGNOSIS — R109 Unspecified abdominal pain: Secondary | ICD-10-CM

## 2015-11-13 DIAGNOSIS — A4902 Methicillin resistant Staphylococcus aureus infection, unspecified site: Secondary | ICD-10-CM | POA: Diagnosis not present

## 2015-11-13 DIAGNOSIS — K8020 Calculus of gallbladder without cholecystitis without obstruction: Secondary | ICD-10-CM | POA: Diagnosis present

## 2015-11-13 DIAGNOSIS — L0291 Cutaneous abscess, unspecified: Secondary | ICD-10-CM

## 2015-11-13 DIAGNOSIS — W19XXXA Unspecified fall, initial encounter: Secondary | ICD-10-CM | POA: Diagnosis not present

## 2015-11-13 DIAGNOSIS — Z825 Family history of asthma and other chronic lower respiratory diseases: Secondary | ICD-10-CM

## 2015-11-13 DIAGNOSIS — E440 Moderate protein-calorie malnutrition: Secondary | ICD-10-CM | POA: Diagnosis not present

## 2015-11-13 DIAGNOSIS — T451X5A Adverse effect of antineoplastic and immunosuppressive drugs, initial encounter: Secondary | ICD-10-CM | POA: Diagnosis present

## 2015-11-13 HISTORY — DX: Nausea with vomiting, unspecified: R11.2

## 2015-11-13 MED ORDER — FAMOTIDINE IN NACL 20-0.9 MG/50ML-% IV SOLN
20.0000 mg | Freq: Two times a day (BID) | INTRAVENOUS | Status: DC
  Administered 2015-11-13: 20 mg via INTRAVENOUS

## 2015-11-13 MED ORDER — ONDANSETRON 8 MG PO TBDP
8.0000 mg | ORAL_TABLET | Freq: Once | ORAL | Status: AC
  Administered 2015-11-13: 8 mg via ORAL
  Filled 2015-11-13: qty 1

## 2015-11-13 MED ORDER — PANTOPRAZOLE SODIUM 40 MG IV SOLR
40.0000 mg | Freq: Every day | INTRAVENOUS | Status: DC
  Administered 2015-11-13 – 2015-11-15 (×3): 40 mg via INTRAVENOUS
  Filled 2015-11-13 (×3): qty 40

## 2015-11-13 MED ORDER — ONDANSETRON HCL 4 MG/2ML IJ SOLN: 4.0000 mg | Freq: Three times a day (TID) | INTRAMUSCULAR | Status: DC | PRN

## 2015-11-13 MED ORDER — FAMOTIDINE IN NACL 20-0.9 MG/50ML-% IV SOLN
INTRAVENOUS | Status: AC
  Filled 2015-11-13: qty 50

## 2015-11-13 MED ORDER — HYDROMORPHONE HCL 4 MG/ML IJ SOLN
2.0000 mg | INTRAMUSCULAR | Status: DC | PRN
  Administered 2015-11-13: 2 mg via INTRAVENOUS

## 2015-11-13 MED ORDER — SODIUM CHLORIDE 0.9 % IV SOLN
Freq: Once | INTRAVENOUS | Status: AC
  Administered 2015-11-13: 10:00:00 via INTRAVENOUS
  Filled 2015-11-13: qty 4

## 2015-11-13 MED ORDER — SODIUM CHLORIDE 0.9 % IV SOLN
10.0000 mg | Freq: Once | INTRAVENOUS | Status: AC
  Administered 2015-11-13: 10 mg via INTRAVENOUS
  Filled 2015-11-13: qty 1

## 2015-11-13 MED ORDER — AMLODIPINE 1 MG/ML ORAL SUSPENSION: 5.0000 mg | Freq: Every day | ORAL | Status: DC

## 2015-11-13 MED ORDER — ENOXAPARIN SODIUM 40 MG/0.4ML ~~LOC~~ SOLN
40.0000 mg | SUBCUTANEOUS | Status: DC
  Administered 2015-11-13 – 2015-11-16 (×4): 40 mg via SUBCUTANEOUS
  Filled 2015-11-13 (×4): qty 0.4

## 2015-11-13 MED ORDER — SCOPOLAMINE 1 MG/3DAYS TD PT72
1.0000 | MEDICATED_PATCH | TRANSDERMAL | Status: DC
  Administered 2015-11-16 – 2015-11-28 (×5): 1.5 mg via TRANSDERMAL
  Filled 2015-11-13 (×6): qty 1

## 2015-11-13 MED ORDER — CHLORHEXIDINE GLUCONATE 0.12 % MT SOLN
15.0000 mL | Freq: Two times a day (BID) | OROMUCOSAL | Status: DC
  Administered 2015-11-13 – 2015-11-30 (×30): 15 mL via OROMUCOSAL
  Filled 2015-11-13 (×30): qty 15

## 2015-11-13 MED ORDER — ALUM & MAG HYDROXIDE-SIMETH 200-200-20 MG/5ML PO SUSP
30.0000 mL | Freq: Once | ORAL | Status: AC
  Administered 2015-11-13: 30 mL via ORAL
  Filled 2015-11-13: qty 30

## 2015-11-13 MED ORDER — ONDANSETRON HCL 4 MG PO TABS: 4.0000 mg | ORAL_TABLET | Freq: Three times a day (TID) | ORAL | Status: DC | PRN

## 2015-11-13 MED ORDER — ALTEPLASE 2 MG IJ SOLR
2.0000 mg | Freq: Once | INTRAMUSCULAR | Status: DC | PRN
  Filled 2015-11-13: qty 2

## 2015-11-13 MED ORDER — SODIUM CHLORIDE 0.9 % IV SOLN
Freq: Once | INTRAVENOUS | Status: AC
  Administered 2015-11-13: 12:00:00 via INTRAVENOUS

## 2015-11-13 MED ORDER — HYDROMORPHONE HCL 2 MG/ML IJ SOLN
2.0000 mg | INTRAMUSCULAR | Status: DC | PRN
Start: 2015-11-13 — End: 2015-11-17
  Administered 2015-11-13: 2 mg via INTRAVENOUS
  Filled 2015-11-13: qty 1

## 2015-11-13 MED ORDER — MAGNESIUM CITRATE PO SOLN: 1.0000 | Freq: Once | ORAL | Status: DC | PRN

## 2015-11-13 MED ORDER — ONDANSETRON 4 MG PO TBDP: 4.0000 mg | ORAL_TABLET | Freq: Three times a day (TID) | ORAL | Status: DC | PRN

## 2015-11-13 MED ORDER — SODIUM CHLORIDE 0.9 % IV SOLN
Freq: Once | INTRAVENOUS | Status: AC
  Administered 2015-11-13: 10:00:00 via INTRAVENOUS

## 2015-11-13 MED ORDER — HEPARIN SOD (PORK) LOCK FLUSH 100 UNIT/ML IV SOLN
250.0000 [IU] | Freq: Once | INTRAVENOUS | Status: DC | PRN
  Filled 2015-11-13: qty 5

## 2015-11-13 MED ORDER — LIDOCAINE-PRILOCAINE 2.5-2.5 % EX CREA
TOPICAL_CREAM | CUTANEOUS | Status: DC | PRN
  Filled 2015-11-13 (×2): qty 5

## 2015-11-13 MED ORDER — PROMETHAZINE HCL 25 MG/ML IJ SOLN
12.5000 mg | Freq: Four times a day (QID) | INTRAMUSCULAR | Status: DC | PRN
  Administered 2015-11-13 – 2015-11-14 (×2): 12.5 mg via INTRAVENOUS
  Filled 2015-11-13 (×2): qty 1

## 2015-11-13 MED ORDER — SCOPOLAMINE 1 MG/3DAYS TD PT72: 1.0000 | MEDICATED_PATCH | TRANSDERMAL | Status: DC

## 2015-11-13 MED ORDER — CETYLPYRIDINIUM CHLORIDE 0.05 % MT LIQD
7.0000 mL | Freq: Two times a day (BID) | OROMUCOSAL | Status: DC
  Administered 2015-11-13 – 2015-11-23 (×17): 7 mL via OROMUCOSAL
  Filled 2015-11-13: qty 7

## 2015-11-13 MED ORDER — SODIUM CHLORIDE 0.9 % IV SOLN
INTRAVENOUS | Status: DC
  Administered 2015-11-13: 1000 mL via INTRAVENOUS
  Administered 2015-11-14 – 2015-11-15 (×3): via INTRAVENOUS

## 2015-11-13 MED ORDER — ACETAMINOPHEN 325 MG PO TABS
650.0000 mg | ORAL_TABLET | ORAL | Status: DC | PRN
  Administered 2015-11-17 – 2015-11-27 (×15): 650 mg via ORAL
  Filled 2015-11-13 (×16): qty 2

## 2015-11-13 MED ORDER — DEXTROSE 5 % IV SOLN
INTRAVENOUS | Status: DC
  Administered 2015-11-13 – 2015-11-14 (×2): 1000 mL via INTRAVENOUS
  Administered 2015-11-16: 19:00:00 via INTRAVENOUS

## 2015-11-13 MED ORDER — AMLODIPINE BESYLATE 5 MG PO TABS
5.0000 mg | ORAL_TABLET | Freq: Every day | ORAL | Status: DC
  Administered 2015-11-13: 5 mg
  Filled 2015-11-13: qty 1

## 2015-11-13 MED ORDER — SODIUM CHLORIDE 0.9 % IV SOLN
8.0000 mg | Freq: Three times a day (TID) | INTRAVENOUS | Status: DC | PRN
  Administered 2015-11-13: 8 mg via INTRAVENOUS
  Filled 2015-11-13 (×3): qty 4

## 2015-11-13 MED ORDER — HYDROMORPHONE HCL 4 MG/ML IJ SOLN
INTRAMUSCULAR | Status: AC
  Filled 2015-11-13: qty 1

## 2015-11-13 MED FILL — TRANSDERM-SCOP 1.5 MG/3 DAY: 1 | 30 days supply | Qty: 10 | Fill #0

## 2015-11-13 NOTE — Progress Notes (Signed)
Patient arrived to unit at around 1310. Alert and orientedx x 4. Placed comfortably in bed. Peg tube intact. Will continue to monitor.

## 2015-11-13 NOTE — Assessment & Plan Note (Signed)
Her pain has improved with IV Dilaudid. Continue pain medication as needed at home

## 2015-11-13 NOTE — Assessment & Plan Note (Signed)
Unfortunately, she developed intractable nausea and vomiting, multifactorial, could be related to side effects of chemotherapy, compounded by severe gastritis and recent constipation  I will prescribe IV fluid resuscitation and IV antiemetics  I plan to extend IV fluid daily starting tomorrow  I will see her back next week for supportive care

## 2015-11-13 NOTE — Progress Notes (Signed)
Debra Farrell came to nursing this morning with a 24 hour history of nausea, dry heaves, and vomiting. She was requesting nausea medicine so that she could tolerate radiation therapy today. I spoke with Dr. Lisbeth Renshaw who agreed to prescribe a one time dose of zofran ODT 8 mg. She has received this medicine. I plan to contact Debra Farrell to help with symptom management in Medical Oncology, to see if she will be able to be seen today.

## 2015-11-13 NOTE — Assessment & Plan Note (Signed)
She is clinically dehydrated. We will give her IV fluid resuscitation and IV antiemetics I will also prescribe scopolamine patch

## 2015-11-13 NOTE — Assessment & Plan Note (Signed)
She has severe acute gastritis, exacerbating her nausea and vomiting. We will give her some Maalox

## 2015-11-13 NOTE — Progress Notes (Signed)
  Oncology Nurse Navigator Documentation  Navigator Location: [facility]-Med Onc (11/13/15 5732) Navigator Encounter Type: Treatment (11/13/15 2025)           Patient Visit Type: Other (11/13/15 4270) Treatment Phase: Treatment (11/13/15 0905) Barriers/Navigation Needs: Coordination of Care (11/13/15 0905)     Met with Ms. Mira following her Tom[facility] tmt.  She was accompanied by her dtr. Ms. Borys arrived today with persistent nausea, dry heaves and vomiting; was directed to Nursing where she received zofran ODT 8 mg prior XRT.   Arrangements were made for her to received IVF and additional antiemetic in MedOnc, I escorted her to Exam Room 7 for the infusion under the care of Dr. Calton Dach RN [place]. During follow-up, Dr. Alvy Bimler was discussing with Ms. Vanderwerf the scheduling of IVF tomorrow and daily next week in conjunction with XRT.  She and her dtr voiced understanding of this plan. I will follow-up with Ms. Wisnieski Monday morning to check on her well-being.  Gayleen Orem, RN, BSN, [place] at [place] [phone]                         Time Spent with Patient: 30 (11/13/15 0905)

## 2015-11-13 NOTE — Telephone Encounter (Signed)
Pt confirmed labs/ov per 02/17 POF, gave pt AVS and Calendar.Cherylann Banas, sent msg to add IVF.Marland KitchenMarland Kitchen

## 2015-11-13 NOTE — Progress Notes (Signed)
I have spoken with Dr. Jerrell [place] nurse regarding Ms. Debra Farrell. She has arranged for Debra Farrell to receive IV fluids and possible IV antiemetics today from their office. Debra Farrell was able to complete her radiation therapy today and is currently heading upstairs to receive her IV fluids. She is aware of the plan and agreeable.

## 2015-11-13 NOTE — H&P (Signed)
Please see my dictation from this morning regarding evaluation in the outpatient clinic.  The patient continues to have persistent uncontrolled nausea and vomiting despite 2 L of IV fluids and IV anti-emetics in the outpatient clinic. The goal of admission would be to place the patient on continuous IV fluids and IV antiemetics overnight  I will reassess tomorrow  She is currently placed on observation unit  I will decide tomorrow if the patient needs to be changed to full admission  I will place her on full liquid diet only for now and reassess tomorrow to see if we can advance her to regular diet   CODE STATUS  Full code

## 2015-11-13 NOTE — Progress Notes (Signed)
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 3 cm right tongue base mass consistent with malignancy.2. Necrotic right level II and III nodal metastases.3. Enlarged right thyroid lobe containing multiple nodules.   10/14/2015 Imaging PET scan showed 1. Hypermetabolic right tongue base mass with hypermetabolic right level II and III adenopathy. Small left level III lymph node appears mildly hypermetabolic as well.2. A few scattered tiny pulmonary nodules are too small for PETresolution   10/16/2015 Imaging [place] Head/Neck:  1) Multiple thyroid lesions bilaterally; dominant nodule in right lower pole measures 22 mm and meets fine-needle aspiration criteria. 2) Complex mass within the soft tissues of the right side of neck worrisome for metastatic malignancy.   10/26/2015 Pathology Results Accession [id number]:  Thyroid FNA - Consistent with benign follicular nodule.   10/28/2015 Surgery Multiple tooth extractions (26), right tongue base biopsy.   10/28/2015 Pathology Results Accession [id number]:  Tongue, right base -  Infiltrative SCC, p16 positive.  Actinomyces infection.   11/05/2015 Procedure Port-a-cath and PEG placed.    11/11/2015 -  Radiation Therapy She received radiation   11/11/2015 -  Chemotherapy She received high dose cisplatin   11/13/2015 Adverse Reaction She presented with intractable nausea and vomiting    INTERVAL HISTORY: Please see below for problem oriented charting.  she is seen urgently as an add-on today because of intractable nausea and vomiting starting yesterday.  she has severe constipation recently, resolved yesterday  She complained of incisional pain and gastritis /burning pain in her stomach  She complained of weakness and mild dizziness  REVIEW OF SYSTEMS:   Constitutional: Denies fevers, chills or abnormal weight loss Eyes: Denies blurriness of vision Ears, nose, mouth, throat, and face: Denies mucositis or sore throat Respiratory: Denies cough, dyspnea or wheezes Cardiovascular: Denies palpitation, chest discomfort or lower extremity swelling Skin: Denies abnormal skin rashes Lymphatics: Denies new lymphadenopathy or easy bruising Neurological:Denies numbness, tingling or new weaknesses Behavioral/Psych: Mood is stable, no new changes  All other systems were reviewed with the patient and are negative.  I have reviewed the past medical history, past surgical history, social history and family history with the patient and they are unchanged from previous note.  ALLERGIES:  is allergic to morphine and related.  MEDICATIONS:  Current Outpatient Prescriptions  Medication Sig Dispense Refill  . dexamethasone (DECADRON) 4 MG tablet Take 2 tablets by mouth once a day on the day after chemotherapy and then take 2 tablets two times a day for 2 days. Take with food. 30 tablet 1  . hydrochlorothiazide (HYDRODIURIL) 25 MG tablet Take 25 mg by mouth daily.    Marland Kitchen ibuprofen (ADVIL,MOTRIN) 200 MG tablet Take 400 mg by mouth every  6 (six) hours as needed for moderate pain.    Marland Kitchen lidocaine-prilocaine (EMLA) cream Apply to affected area once 30 g 3  . ondansetron (ZOFRAN) 8 MG tablet Take 1  tablet (8 mg total) by mouth 2 (two) times daily as needed. Start on the third day after chemotherapy. 30 tablet 1  . oxyCODONE-acetaminophen (PERCOCET) 10-325 MG tablet Take 1 tablet by mouth every 4 (four) hours as needed for pain. 60 tablet 0  . prochlorperazine (COMPAZINE) 10 MG tablet Take 1 tablet (10 mg total) by mouth every 6 (six) hours as needed (Nausea or vomiting). 30 tablet 1  . scopolamine (TRANSDERM-SCOP) 1 MG/3DAYS Place 1 patch (1.5 mg total) onto the skin every 3 (three) days. 10 patch 12   No current facility-administered medications for this visit.   Facility-Administered Medications Ordered in Other Visits  Medication Dose Route Frequency Provider Last Rate Last Dose  . alteplase (CATHFLO ACTIVASE) injection 2 mg  2 mg Intracatheter Once PRN [name], MD      . heparin lock flush 100 unit/mL  250 Units Intracatheter Once PRN [name], MD      . HYDROmorphone (DILAUDID) injection 2 mg  2 mg Intravenous Q2H PRN [name], MD   2 mg at 11/13/15 8850    PHYSICAL EXAMINATION: ECOG PERFORMANCE STATUS: 2 - Symptomatic, <50% confined to bed GENERAL:alert, no distress and comfortable. She appears debilitated SKIN: skin color, texture, turgor are normal, no rashes or significant lesions EYES: normal, Conjunctiva are pink and non-injected, sclera clear OROPHARYNX:no exudate, no erythema and lips, buccal mucosa, and tongue normal  NECK: supple, thyroid normal size, non-tender, without nodularity LYMPH:  no palpable lymphadenopathy in the cervical, axillary or inguinal LUNGS: clear to auscultation and percussion with normal breathing effort HEART: regular rate & rhythm and no murmurs and no lower extremity edema ABDOMEN:abdomen soft, non-tender and normal bowel sounds. Well-healed surgical scar. Mild tenderness on palpation near the feeding tube area Musculoskeletal:no cyanosis of digits and no clubbing  NEURO: alert & oriented x 3 with fluent speech, no focal motor/sensory  deficits  LABORATORY DATA:  I have reviewed the data as listed    Component Value Date/Time   NA 141 11/05/2015 1212   K 3.7 11/05/2015 1212   CL 104 11/05/2015 1212   CO2 25 11/05/2015 1212   GLUCOSE 84 11/05/2015 1212   BUN 12 11/05/2015 1212   CREATININE 0.83 11/05/2015 1212   CREATININE 1.04 04/07/2015 1536   CALCIUM 9.7 11/05/2015 1212   PROT 7.2 11/05/2015 1212   ALBUMIN 4.3 11/05/2015 1212   AST 30 11/05/2015 1212   ALT 29 11/05/2015 1212   ALKPHOS 92 11/05/2015 1212   BILITOT 1.0 11/05/2015 1212   GFRNONAA >60 11/05/2015 1212   GFRAA >60 11/05/2015 1212    No results found for: SPEP, UPEP  Lab Results  Component Value Date   WBC 6.3 11/05/2015   NEUTROABS 4.4 11/05/2015   HGB 10.3* 11/05/2015   HCT 33.4* 11/05/2015   MCV 62.9* 11/05/2015   PLT 298 11/05/2015      Chemistry      Component Value Date/Time   NA 141 11/05/2015 1212   K 3.7 11/05/2015 1212   CL 104 11/05/2015 1212   CO2 25 11/05/2015 1212   BUN 12 11/05/2015 1212   CREATININE 0.83 11/05/2015 1212   CREATININE 1.04 04/07/2015 1536      Component Value Date/Time   CALCIUM 9.7 11/05/2015 1212   ALKPHOS 92 11/05/2015 1212  AST 30 11/05/2015 1212   ALT 29 11/05/2015 1212   BILITOT 1.0 11/05/2015 1212      ASSESSMENT & PLAN:  Cancer of base of tongue ([place])  Unfortunately, she developed intractable nausea and vomiting, multifactorial, could be related to side effects of chemotherapy, compounded by severe gastritis and recent constipation  I will prescribe IV fluid resuscitation and IV antiemetics  I plan to extend IV fluid daily starting tomorrow  I will see her back next week for supportive care  Intractable nausea and vomiting  She is clinically dehydrated. We will give her IV fluid resuscitation and IV antiemetics I will also prescribe scopolamine patch  Gastritis, acute  She has severe acute gastritis, exacerbating her nausea and vomiting. We will give her some Maalox  Pain at  surgical incision  Her pain has improved with IV Dilaudid. Continue pain medication as needed at home   No orders of the defined types were placed in this encounter.   All questions were answered. The patient knows to call the clinic with any problems, questions or concerns. No barriers to learning was detected. I spent 20 minutes counseling the patient face to face. The total time spent in the appointment was 30 minutes and more than 50% was on counseling and review of test results     [facility], [place], MD 11/13/2015 10:48 AM  ,

## 2015-11-13 NOTE — Progress Notes (Signed)
Pt c/o severe N/V over 24 hrs.  Given IVFs and Anti emetics in Exam room today as there was no availability in Infusion room.  Pt c/o epigastric burning pain 6/10 which was unrelieved with Dilaudid IV.  Pt reported dilaudid helped the soreness at PEG tube site but did nothing for the burning pain in her stomach.  Dr. Alvy Bimler aware and Maalox given via PEG tube.   Pt's daughter, Abigail Butts, picked up Scolpolomine patch from [employer] and pt applied skin behind her right ear.  Pt continued to have persistent nausea with vomiting and dry heaves after 2 liters IVFs,  IV zofran, pepcid and Dex..  Dr. Alvy Bimler recommends pt admitted to hospital for intractable n/v.  Pt agreed. She was taken by w/c in stable condition to 3W accompanied by her daughter.

## 2015-11-13 NOTE — Telephone Encounter (Signed)
Per staff message and POF I have scheduled appts. Advised scheduler of appts. And no available on 2/22 JMW

## 2015-11-14 ENCOUNTER — Ambulatory Visit: Payer: Self-pay

## 2015-11-14 ENCOUNTER — Observation Stay (HOSPITAL_COMMUNITY): Payer: 59

## 2015-11-14 DIAGNOSIS — E871 Hypo-osmolality and hyponatremia: Secondary | ICD-10-CM

## 2015-11-14 DIAGNOSIS — E785 Hyperlipidemia, unspecified: Secondary | ICD-10-CM | POA: Diagnosis not present

## 2015-11-14 DIAGNOSIS — K2900 Acute gastritis without bleeding: Secondary | ICD-10-CM | POA: Diagnosis not present

## 2015-11-14 DIAGNOSIS — C01 Malignant neoplasm of base of tongue: Secondary | ICD-10-CM | POA: Diagnosis not present

## 2015-11-14 DIAGNOSIS — Z6826 Body mass index (BMI) 26.0-26.9, adult: Secondary | ICD-10-CM | POA: Diagnosis not present

## 2015-11-14 DIAGNOSIS — B370 Candidal stomatitis: Secondary | ICD-10-CM | POA: Diagnosis not present

## 2015-11-14 DIAGNOSIS — A4902 Methicillin resistant Staphylococcus aureus infection, unspecified site: Secondary | ICD-10-CM | POA: Diagnosis not present

## 2015-11-14 DIAGNOSIS — I10 Essential (primary) hypertension: Secondary | ICD-10-CM

## 2015-11-14 DIAGNOSIS — E440 Moderate protein-calorie malnutrition: Secondary | ICD-10-CM | POA: Diagnosis not present

## 2015-11-14 DIAGNOSIS — K2970 Gastritis, unspecified, without bleeding: Secondary | ICD-10-CM | POA: Diagnosis not present

## 2015-11-14 DIAGNOSIS — T451X5A Adverse effect of antineoplastic and immunosuppressive drugs, initial encounter: Secondary | ICD-10-CM | POA: Diagnosis present

## 2015-11-14 DIAGNOSIS — T814XXA Infection following a procedure, initial encounter: Secondary | ICD-10-CM | POA: Diagnosis not present

## 2015-11-14 DIAGNOSIS — Z809 Family history of malignant neoplasm, unspecified: Secondary | ICD-10-CM | POA: Diagnosis not present

## 2015-11-14 DIAGNOSIS — K651 Peritoneal abscess: Secondary | ICD-10-CM | POA: Diagnosis not present

## 2015-11-14 DIAGNOSIS — D759 Disease of blood and blood-forming organs, unspecified: Secondary | ICD-10-CM | POA: Diagnosis present

## 2015-11-14 DIAGNOSIS — D509 Iron deficiency anemia, unspecified: Secondary | ICD-10-CM | POA: Diagnosis not present

## 2015-11-14 DIAGNOSIS — R5081 Fever presenting with conditions classified elsewhere: Secondary | ICD-10-CM | POA: Diagnosis not present

## 2015-11-14 DIAGNOSIS — Z90710 Acquired absence of both cervix and uterus: Secondary | ICD-10-CM | POA: Diagnosis not present

## 2015-11-14 DIAGNOSIS — L02211 Cutaneous abscess of abdominal wall: Secondary | ICD-10-CM | POA: Diagnosis not present

## 2015-11-14 DIAGNOSIS — D709 Neutropenia, unspecified: Secondary | ICD-10-CM | POA: Diagnosis not present

## 2015-11-14 DIAGNOSIS — E43 Unspecified severe protein-calorie malnutrition: Secondary | ICD-10-CM | POA: Diagnosis not present

## 2015-11-14 DIAGNOSIS — R1110 Vomiting, unspecified: Secondary | ICD-10-CM | POA: Diagnosis not present

## 2015-11-14 DIAGNOSIS — L0291 Cutaneous abscess, unspecified: Secondary | ICD-10-CM | POA: Diagnosis not present

## 2015-11-14 DIAGNOSIS — W19XXXA Unspecified fall, initial encounter: Secondary | ICD-10-CM | POA: Diagnosis not present

## 2015-11-14 DIAGNOSIS — N179 Acute kidney failure, unspecified: Secondary | ICD-10-CM | POA: Diagnosis not present

## 2015-11-14 DIAGNOSIS — D563 Thalassemia minor: Secondary | ICD-10-CM | POA: Diagnosis not present

## 2015-11-14 DIAGNOSIS — Z825 Family history of asthma and other chronic lower respiratory diseases: Secondary | ICD-10-CM | POA: Diagnosis not present

## 2015-11-14 DIAGNOSIS — Z931 Gastrostomy status: Secondary | ICD-10-CM | POA: Diagnosis not present

## 2015-11-14 DIAGNOSIS — Z431 Encounter for attention to gastrostomy: Secondary | ICD-10-CM | POA: Diagnosis not present

## 2015-11-14 DIAGNOSIS — Z79899 Other long term (current) drug therapy: Secondary | ICD-10-CM | POA: Diagnosis not present

## 2015-11-14 DIAGNOSIS — E46 Unspecified protein-calorie malnutrition: Secondary | ICD-10-CM

## 2015-11-14 DIAGNOSIS — N281 Cyst of kidney, acquired: Secondary | ICD-10-CM | POA: Diagnosis not present

## 2015-11-14 DIAGNOSIS — F419 Anxiety disorder, unspecified: Secondary | ICD-10-CM | POA: Diagnosis present

## 2015-11-14 DIAGNOSIS — R599 Enlarged lymph nodes, unspecified: Secondary | ICD-10-CM | POA: Diagnosis present

## 2015-11-14 DIAGNOSIS — R112 Nausea with vomiting, unspecified: Secondary | ICD-10-CM | POA: Diagnosis not present

## 2015-11-14 DIAGNOSIS — E860 Dehydration: Secondary | ICD-10-CM | POA: Diagnosis not present

## 2015-11-14 DIAGNOSIS — D568 Other thalassemias: Secondary | ICD-10-CM | POA: Diagnosis not present

## 2015-11-14 DIAGNOSIS — R05 Cough: Secondary | ICD-10-CM | POA: Diagnosis not present

## 2015-11-14 DIAGNOSIS — B9562 Methicillin resistant Staphylococcus aureus infection as the cause of diseases classified elsewhere: Secondary | ICD-10-CM | POA: Diagnosis not present

## 2015-11-14 DIAGNOSIS — L309 Dermatitis, unspecified: Secondary | ICD-10-CM | POA: Diagnosis not present

## 2015-11-14 DIAGNOSIS — K8020 Calculus of gallbladder without cholecystitis without obstruction: Secondary | ICD-10-CM | POA: Diagnosis not present

## 2015-11-14 DIAGNOSIS — Z510 Encounter for antineoplastic radiation therapy: Secondary | ICD-10-CM | POA: Diagnosis not present

## 2015-11-14 DIAGNOSIS — Z885 Allergy status to narcotic agent status: Secondary | ICD-10-CM | POA: Diagnosis not present

## 2015-11-14 DIAGNOSIS — E876 Hypokalemia: Secondary | ICD-10-CM | POA: Diagnosis not present

## 2015-11-14 DIAGNOSIS — B9689 Other specified bacterial agents as the cause of diseases classified elsewhere: Secondary | ICD-10-CM | POA: Diagnosis not present

## 2015-11-14 DIAGNOSIS — R208 Other disturbances of skin sensation: Secondary | ICD-10-CM | POA: Diagnosis not present

## 2015-11-14 DIAGNOSIS — K1230 Oral mucositis (ulcerative), unspecified: Secondary | ICD-10-CM | POA: Diagnosis not present

## 2015-11-14 LAB — COMPREHENSIVE METABOLIC PANEL WITH GFR
ALT: 37 U/L (ref 14–54)
AST: 31 U/L (ref 15–41)
Albumin: 2.9 g/dL — ABNORMAL LOW (ref 3.5–5.0)
Alkaline Phosphatase: 93 U/L (ref 38–126)
Anion gap: 12 (ref 5–15)
BUN: 52 mg/dL — ABNORMAL HIGH (ref 6–20)
CO2: 22 mmol/L (ref 22–32)
Calcium: 8.2 mg/dL — ABNORMAL LOW (ref 8.9–10.3)
Chloride: 96 mmol/L — ABNORMAL LOW (ref 101–111)
Creatinine, Ser: 1.84 mg/dL — ABNORMAL HIGH (ref 0.44–1.00)
GFR calc Af Amer: 31 mL/min — ABNORMAL LOW (ref >60)
GFR calc non Af Amer: 27 mL/min — ABNORMAL LOW (ref >60)
Glucose, Bld: 186 mg/dL — ABNORMAL HIGH (ref 65–99)
Potassium: 3.6 mmol/L (ref 3.5–5.1)
Sodium: 130 mmol/L — ABNORMAL LOW (ref 135–145)
Total Bilirubin: 1.1 mg/dL (ref 0.3–1.2)
Total Protein: 6 g/dL — ABNORMAL LOW (ref 6.5–8.1)

## 2015-11-14 LAB — URINALYSIS, ROUTINE W REFLEX MICROSCOPIC
BILIRUBIN URINE: NEGATIVE
GLUCOSE, UA: NEGATIVE mg/dL
KETONES UR: NEGATIVE mg/dL
Nitrite: NEGATIVE
PH: 6.5 (ref 5.0–8.0)
PROTEIN: NEGATIVE mg/dL
Specific Gravity, Urine: 1.01 (ref 1.005–1.030)

## 2015-11-14 LAB — URINE MICROSCOPIC-ADD ON

## 2015-11-14 MED ORDER — SUCRALFATE 1 GM/10ML PO SUSP
1.0000 g | Freq: Three times a day (TID) | ORAL | Status: DC
  Administered 2015-11-14 – 2015-11-16 (×8): 1 g via ORAL
  Filled 2015-11-14 (×10): qty 10

## 2015-11-14 MED ORDER — LORAZEPAM 2 MG/ML IJ SOLN
0.5000 mg | INTRAMUSCULAR | Status: DC | PRN
  Administered 2015-11-14 – 2015-11-16 (×2): 0.5 mg via INTRAVENOUS
  Filled 2015-11-14 (×2): qty 1

## 2015-11-14 MED ORDER — AMLODIPINE BESYLATE 5 MG PO TABS
5.0000 mg | ORAL_TABLET | Freq: Two times a day (BID) | ORAL | Status: DC
  Administered 2015-11-14 – 2015-11-17 (×7): 5 mg
  Filled 2015-11-14 (×8): qty 1

## 2015-11-14 NOTE — Progress Notes (Signed)
Patient educated on her safety.  Patient was assisted after an accident to get cleaned up.  Patient sitting on couch in room and almost fell face forward on the floor however I was able to break her fall.  Patient was educated that once we got her cleaned up that she would be placed back in the bed with the bed alarm set.  Patient stated "I know how to turn it off".  Patient's daughter also in room and stated that "She'll just turn it off.  She's impatient and can't wait until someone gets here to get up".  Patient and daughter both encouraged to call for assistance as her safety was a priority.  Patient and daughter verbalized understanding.  Patient safely in bed with bed alarm initiated.

## 2015-11-14 NOTE — Progress Notes (Signed)
At 1745 patient was assisted to the [facility].  Patient needed additional tissue.  Patient was informed not to get up with assist, RN went to get additional tissue.  RN came right back to room and patient was sitting on the floor.  Patient was asked what happened.  She stated that she stood up to get herself cleaned up and then her IV line got tangled in the [facility] and she sat down on her bottom.  Post fall huddle was completed.  Patient stated that "I need to listen to you guys more".

## 2015-11-14 NOTE — Progress Notes (Signed)
[name]   DOB:1946-05-13   NW#:[id number]   Oncology History   Cancer of base of tongue (HCC)   Staging form: Lip and Oral Cavity, AJCC 7th Edition     Clinical stage from 10/14/2015: Stage IVA (T2, N2b, M0) - Signed by Artis Delay, MD on 10/19/2015       Cancer of base of tongue (HCC)   09/29/2015 Pathology Results Accession: [id number] FNA right neck showed necrotic cells suspicious for squamous cell cancer   09/29/2015 Pathology Results Accession: [id number] Mouth biopsy showed pyogenic granuloma   10/08/2015 Imaging CT neck showed 1. 3 cm right tongue base mass consistent with malignancy.2. Necrotic right level II and III nodal metastases.3. Enlarged right thyroid lobe containing multiple nodules.   10/14/2015 Imaging PET scan showed 1. Hypermetabolic right tongue base mass with hypermetabolic right level II and III adenopathy. Small left level III lymph node appears mildly hypermetabolic as well.2. A few scattered tiny pulmonary nodules are too small for PETresolution   10/16/2015 Imaging [place] Head/Neck:  1) Multiple thyroid lesions bilaterally; dominant nodule in right lower pole measures 22 mm and meets fine-needle aspiration criteria. 2) Complex mass within the soft tissues of the right side of neck worrisome for metastatic malignancy.   10/26/2015 Pathology Results Accession [id number]:  Thyroid FNA - Consistent with benign follicular nodule.   10/28/2015 Surgery Multiple tooth extractions (26), right tongue base biopsy.   10/28/2015 Pathology Results Accession [id number]:  Tongue, right base -  Infiltrative SCC, p16 positive.  Actinomyces infection.   11/05/2015 Procedure Port-a-cath and PEG placed.   11/11/2015 -  Radiation Therapy She received radiation   11/11/2015 -  Chemotherapy She received high dose cisplatin   11/13/2015 Adverse Reaction She presented with intractable nausea and vomiting   Debra Farrell is seen today in [facility] Subjective: She was admitted from yesterday. Vomiting has stopped but she  remained nauseated with burning sensation in her stomach. No appetite. Restless with poor sleep. Some urine output. No bowel movement. Felt miserable. Unable to tolerate oral intake  Objective:  Filed Vitals:   11/13/15 2151 11/14/15 0600  BP: 162/71 174/73  Pulse: 74 67  Temp: 98.2 F (36.8 C) 98.6 F (37 C)  Resp: 18 18     Intake/Output Summary (Last 24 hours) at 11/14/15 7253 Last data filed at 11/13/15 2130  Gross per 24 hour  Intake 131.67 ml  Output    900 ml  Net -768.33 ml    GENERAL:alert, mild distress, restless, appears uncomfortable SKIN: skin color, texture, turgor are normal, no rashes or significant lesions EYES: normal, Conjunctiva are pink and non-injected, sclera clear OROPHARYNX:no exudate, no erythema and lips, buccal mucosa, and tongue normal. Dry tongue ABDOMEN:abdomen soft, mild tenderness near feeding tube, reduced bowel sounds, no rebound or guarding Musculoskeletal:no cyanosis of digits and no clubbing  NEURO: alert & oriented x 3 with fluent speech, no focal motor/sensory deficits   Labs:  Lab Results  Component Value Date   WBC 6.3 11/05/2015   HGB 10.3* 11/05/2015   HCT 33.4* 11/05/2015   MCV 62.9* 11/05/2015   PLT 298 11/05/2015   NEUTROABS 4.4 11/05/2015    Lab Results  Component Value Date   NA 130* 11/14/2015   K 3.6 11/14/2015   CL 96* 11/14/2015   CO2 22 11/14/2015    Assessment & Plan:   Cancer of base of tongue (HCC) Unfortunately, she developed intractable nausea and vomiting, multifactorial, could be related to side effects of chemotherapy, compounded by  severe gastritis and recent constipation Continue supportive care  Intractable nausea and vomiting She is clinically dehydrated. She received IV dexamethasone for 2 days on 2/16 & 2/17 Continue IVF, switch back to normal saline at 100 cc/hour Continue IV antiemetics, scopolamine patch and add IV ativan Check abdominal X ray to exclude ileus Add urinalysis +/-  culture  Gastritis, acute She has severe acute gastritis, exacerbating her nausea and vomiting. Continue IV protonix Add carafate  Hyponatremia Acute renal failure Due to dehydration Continue IVF resuscitation Monitor daily  Protein calorie malnutriton Poor oral intake Consult nutritionist On full liquid only now Checking electrolytes tomorrow  Essential Hypertension Was on HCTZ at home/baseline. Due to dehydration, this is stopped Starting on amlodipine for now  Pain at surgical incision Her pain has improved with IV Dilaudid. Continue pain medication as needed   Discharge planning  Not ready to go home due to unresolved nausea and poor oral intake Keep over the weekend   [facility], Arletha Marschke, MD 11/14/2015  7:22 AM

## 2015-11-14 NOTE — Progress Notes (Addendum)
NUTRITION NOTE  Consult received for poor PO intake and pt with PEG placed 11/05/15. Pt is followed by Jackson RD and was last seen by that RD 11/11/15. At that time pt's PO intake had been diminished and RD recommended that pt using Ensure Plus or equivalent via PEG once/day to supplement poor intakes.   No RD on site today. RD will be available 2/19 and will see pt for full assessment at that time and provide more appropriate TF recommendations. Until then, recommend Ensure Enlive TID via PEG to provide 1050 kcal, 60 grams of protein.     Jarome Matin, RD, LDN Inpatient Clinical Dietitian Pager # [phone] After hours/weekend pager # [phone]

## 2015-11-15 DIAGNOSIS — D509 Iron deficiency anemia, unspecified: Secondary | ICD-10-CM

## 2015-11-15 DIAGNOSIS — R112 Nausea with vomiting, unspecified: Secondary | ICD-10-CM

## 2015-11-15 DIAGNOSIS — E876 Hypokalemia: Secondary | ICD-10-CM

## 2015-11-15 DIAGNOSIS — C01 Malignant neoplasm of base of tongue: Secondary | ICD-10-CM

## 2015-11-15 LAB — PREPARE RBC (CROSSMATCH)

## 2015-11-15 LAB — COMPREHENSIVE METABOLIC PANEL
ALBUMIN: 3.1 g/dL — AB (ref 3.5–5.0)
ALK PHOS: 92 U/L (ref 38–126)
ALT: 34 U/L (ref 14–54)
AST: 34 U/L (ref 15–41)
Anion gap: 10 (ref 5–15)
BILIRUBIN TOTAL: 1.3 mg/dL — AB (ref 0.3–1.2)
BUN: 43 mg/dL — ABNORMAL HIGH (ref 6–20)
CALCIUM: 8 mg/dL — AB (ref 8.9–10.3)
CO2: 26 mmol/L (ref 22–32)
CREATININE: 1.54 mg/dL — AB (ref 0.44–1.00)
Chloride: 92 mmol/L — ABNORMAL LOW (ref 101–111)
GFR, EST AFRICAN AMERICAN: 39 mL/min — AB (ref >60)
GFR, EST NON AFRICAN AMERICAN: 34 mL/min — AB (ref >60)
Glucose, Bld: 105 mg/dL — ABNORMAL HIGH (ref 65–99)
Potassium: 2.5 mmol/L — CL (ref 3.5–5.1)
SODIUM: 128 mmol/L — AB (ref 135–145)
TOTAL PROTEIN: 6.1 g/dL — AB (ref 6.5–8.1)

## 2015-11-15 LAB — RETICULOCYTES
RBC.: 3.97 MIL/uL (ref 3.87–5.11)
RETIC COUNT ABSOLUTE: 23.8 10*3/uL (ref 19.0–186.0)
RETIC CT PCT: 0.6 % (ref 0.4–3.1)

## 2015-11-15 LAB — CBC WITH DIFFERENTIAL/PLATELET
BASOS ABS: 0 10*3/uL (ref 0.0–0.1)
BASOS PCT: 0 %
EOS ABS: 0 10*3/uL (ref 0.0–0.7)
Eosinophils Relative: 0 %
HCT: 22.8 % — ABNORMAL LOW (ref 36.0–46.0)
HEMOGLOBIN: 7.6 g/dL — AB (ref 12.0–15.0)
LYMPHS PCT: 5 %
Lymphs Abs: 0.4 10*3/uL — ABNORMAL LOW (ref 0.7–4.0)
MCH: 19.5 pg — AB (ref 26.0–34.0)
MCHC: 33.3 g/dL (ref 30.0–36.0)
MCV: 58.5 fL — AB (ref 78.0–100.0)
MONO ABS: 0.5 10*3/uL (ref 0.1–1.0)
Monocytes Relative: 6 %
NEUTROS PCT: 89 %
Neutro Abs: 6.6 10*3/uL (ref 1.7–7.7)
PLATELETS: 337 10*3/uL (ref 150–400)
RBC: 3.9 MIL/uL (ref 3.87–5.11)
RDW: 13.7 % (ref 11.5–15.5)
WBC: 7.5 10*3/uL (ref 4.0–10.5)

## 2015-11-15 LAB — ABO/RH: ABO/RH(D): O POS

## 2015-11-15 LAB — FERRITIN: Ferritin: 501 ng/mL — ABNORMAL HIGH (ref 11–307)

## 2015-11-15 LAB — MAGNESIUM: Magnesium: 1.8 mg/dL (ref 1.7–2.4)

## 2015-11-15 LAB — IRON AND TIBC
IRON: 135 ug/dL (ref 28–170)
Saturation Ratios: 74 % — ABNORMAL HIGH (ref 10.4–31.8)
TIBC: 183 ug/dL — AB (ref 250–450)
UIBC: 48 ug/dL

## 2015-11-15 LAB — LACTATE DEHYDROGENASE: LDH: 252 U/L — AB (ref 98–192)

## 2015-11-15 LAB — PHOSPHORUS: Phosphorus: 3.2 mg/dL (ref 2.5–4.6)

## 2015-11-15 MED ORDER — METOCLOPRAMIDE HCL 5 MG/ML IJ SOLN
10.0000 mg | Freq: Four times a day (QID) | INTRAMUSCULAR | Status: DC
  Administered 2015-11-15 – 2015-11-17 (×7): 10 mg via INTRAVENOUS
  Filled 2015-11-15 (×7): qty 2

## 2015-11-15 MED ORDER — PANTOPRAZOLE SODIUM 40 MG PO PACK
40.0000 mg | PACK | Freq: Every day | ORAL | Status: DC
  Administered 2015-11-16 – 2015-11-29 (×14): 40 mg
  Filled 2015-11-15 (×18): qty 20

## 2015-11-15 MED ORDER — ONDANSETRON HCL 40 MG/20ML IJ SOLN
8.0000 mg | Freq: Three times a day (TID) | INTRAMUSCULAR | Status: DC
  Administered 2015-11-15 – 2015-11-18 (×9): 8 mg via INTRAVENOUS
  Filled 2015-11-15 (×10): qty 4

## 2015-11-15 MED ORDER — POTASSIUM CHLORIDE 10 MEQ/50ML IV SOLN
10.0000 meq | INTRAVENOUS | Status: AC
  Administered 2015-11-15 (×4): 10 meq via INTRAVENOUS
  Filled 2015-11-15 (×4): qty 50

## 2015-11-15 MED ORDER — POTASSIUM CHLORIDE IN NACL 20-0.45 MEQ/L-% IV SOLN
INTRAVENOUS | Status: DC
  Administered 2015-11-15 – 2015-11-16 (×2): via INTRAVENOUS
  Filled 2015-11-15 (×6): qty 1000

## 2015-11-15 NOTE — Progress Notes (Signed)
Key Points: Use following P&T approved IV to PO non-antibiotic change policy.  Description contains the criteria that are approved Note: Policy Excludes:  Esophagectomy patientsPHARMACIST - PHYSICIAN COMMUNICATION DR:   Jana Hakim CONCERNING: IV to Oral Route Change Policy  RECOMMENDATION: This patient is receiving Protonix by the intravenous route.  Based on criteria approved by the Pharmacy and Therapeutics Committee, the intravenous medication(s) is/are being converted to the equivalent oral dose form(s).   DESCRIPTION: These criteria include:  The patient is eating (either orally or via tube) and/or has been taking other orally administered medications for a least 24 hours  The patient has no evidence of active gastrointestinal bleeding or impaired GI absorption (gastrectomy, short bowel, patient on TNA or NPO).  If you have questions about this conversion, please contact the Pharmacy Department  '[]'$   [phone] )  [facility] '[]'$   [phone] )  [facility] Pontes  '[]'$   [phone] )  [facility] '[x]'$   [phone] )  [place], [place], [facility] 11/15/2015 12:23 PM

## 2015-11-15 NOTE — Progress Notes (Signed)
Debra Farrell   DOB:01-Jun-1946   ZO#:[id number]   JWJ#:[id number]  Subjective: [name] tells me lorazepam makes her "loopy" and that's why she fell yesterday. She hit her L shoulder area. She hates the new fall precautions-- the bed starts beeping loudly every time she sits up--but unfortunately I cannot cancel that for her. She is still gagging but "there's nothing to vomit." Denies pain. Normal brown BM last PM. Urine is "clear." Minimal epistaxis. No family in room   Objective: 70 White woman examined in bed Filed Vitals:   11/15/15 0213 11/15/15 0514  BP: 167/68 165/71  Pulse: 66 66  Temp: 99 F (37.2 C) 98.8 F (37.1 C)  Resp: 16 16    Body mass index is 26.66 kg/(m^2).  Intake/Output Summary (Last 24 hours) at 11/15/15 0846 Last data filed at 11/15/15 6213  Gross per 24 hour  Intake   1220 ml  Output    800 ml  Net    420 ml     Lungs clear -- no rales or rhonchi  Heart regular rate and rhythm  Abdomen soft, +BS, PEG in UOQ, intact  Neuro nonfocal    CBG (last 3)  No results for input(s): GLUCAP in the last 72 hours.   Labs:  Lab Results  Component Value Date   WBC 7.5 11/15/2015   HGB 7.6* 11/15/2015   HCT 22.8* 11/15/2015   MCV 58.5* 11/15/2015   PLT 337 11/15/2015   NEUTROABS 6.6 11/15/2015    @LASTCHEMISTRY @  Urine Studies No results for input(s): UHGB, CRYS in the last 72 hours.  Invalid input(s): UACOL, UAPR, USPG, UPH, UTP, UGL, UKET, UBIL, UNIT, UROB, ULEU, UEPI, UWBC, URBC, UBAC, CAST, UCOM, BILUA  Basic Metabolic Panel:  Recent Labs Lab 11/14/15 0500 11/15/15 0530  NA 130* 128*  K 3.6 2.5*  CL 96* 92*  CO2 22 26  GLUCOSE 186* 105*  BUN 52* 43*  CREATININE 1.84* 1.54*  CALCIUM 8.2* 8.0*  PHOS  --  3.2   GFR Estimated Creatinine Clearance: 36.2 mL/min (by C-G formula based on Cr of 1.54). Liver Function Tests:  Recent Labs Lab 11/14/15 0500 11/15/15 0530  AST 31 34  ALT 37 34  ALKPHOS 93 92  BILITOT 1.1 1.3*  PROT 6.0*  6.1*  ALBUMIN 2.9* 3.1*   No results for input(s): LIPASE, AMYLASE in the last 168 hours. No results for input(s): AMMONIA in the last 168 hours. Coagulation profile No results for input(s): INR, PROTIME in the last 168 hours.  CBC:  Recent Labs Lab 11/15/15 0530  WBC 7.5  NEUTROABS 6.6  HGB 7.6*  HCT 22.8*  MCV 58.5*  PLT 337   Cardiac Enzymes: No results for input(s): CKTOTAL, CKMB, CKMBINDEX, TROPONINI in the last 168 hours. BNP: Invalid input(s): POCBNP CBG: No results for input(s): GLUCAP in the last 168 hours. D-Dimer No results for input(s): DDIMER in the last 72 hours. Hgb A1c No results for input(s): HGBA1C in the last 72 hours. Lipid Profile No results for input(s): CHOL, HDL, LDLCALC, TRIG, CHOLHDL, LDLDIRECT in the last 72 hours. Thyroid function studies No results for input(s): TSH, T4TOTAL, T3FREE, THYROIDAB in the last 72 hours.  Invalid input(s): FREET3 Anemia work up  Recent Labs  11/15/15 0700  RETICCTPCT 0.6   Microbiology No results found for this or any previous visit (from the past 240 hour(s)).    Studies:  Dg Abd 2 Views  11/14/2015  CLINICAL DATA:  70 year old female with 2- 3 day  history of nausea/vomiting after chemotherapy for tongue cancer EXAM: ABDOMEN - 2 VIEW COMPARISON:  Prior PET-CT 11/05/2015 FINDINGS: Nonobstructive bowel gas pattern. Gastrostomy tube in good position overlying the gastric bubble. No acute bony abnormality. No evidence of free air on the decubitus view. Chronic parenchymal changes in the visualized lower lobes of the lungs. IMPRESSION: No evidence of bowel obstruction or free air. Gastrostomy tube appears to be in good position. Electronically Signed   By: Malachy Moan M.D.   On: 11/14/2015 10:16    Assessment: 70 y.o. 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   Changed anti-nausea meds to RTC instead of PRN, continued PRN phenergan and lorazepam; increased IVF; correcting hypokalemia; transfused PRBCs and checking iron studies.   LDH and t bil are both mildly elevated; retics low; haptoglobin is pending; little suspicion for hemolysis; smear not yet available for review.  Not eating but PEG feeds make her more nauseated, she says, so refuses at present.   A bit discouraged. Emphasized that we can intensify supportive care next cycle.   Will repeat labs tomorrow. Not ready for discharge at this point.   Lowella Dell, MD 11/15/2015  8:46 AM Medical Oncology and Hematology [facility] [address] [place], [place] [address] Tel. [phone]    Fax. [phone]

## 2015-11-15 NOTE — Progress Notes (Signed)
CRITICAL VALUE ALERT  Critical value received:  K+ 2.5   Date of notification:  11/15/2015  Time of notification: 0620  Critical value read back:Yes  Nurse who received alert:  Azzie Glatter, RN  MD notified (1st page): Magrinat  Time of first page: [phone]  MD notified (2nd page): n/a  Time of second page: n/a  Responding MD:  Magrinat  Time MD responded: [id number] (MD also informed about drop in HGB from 10.3 to 7.6)

## 2015-11-15 NOTE — Progress Notes (Signed)
Initial Nutrition Assessment  DOCUMENTATION CODES:   Severe malnutrition in context of acute illness/injury  INTERVENTION:   Once nausea more controlled, patient agreeable to attempting tube feeds 2/20. Will initiate 1 bottle of Ensure Enlive @ 10 ml/hr.  Once feeds started, will need to monitor for refeeding syndrome given severe malnutrition status.  RD to continue to follow for tolerance and advancement.   NUTRITION DIAGNOSIS:   Malnutrition related to acute illness as evidenced by energy intake < or equal to 50% for > or equal to 5 days, severe depletion of body fat.  GOAL:   Patient will meet greater than or equal to 90% of their needs  MONITOR:   Labs, Weight trends, TF tolerance, Skin, I & O's, PO intake  REASON FOR ASSESSMENT:   Consult Poor PO (Has PEG tube placement for tongue cancer. 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 She is followed by Dr. Alvy Bimler and a request has been made for a [facility] for chemo and a g-tube. Of note, she does have thyroid nodules as well that were biopsied by Dr. Anselm Pancoast yesterday.  Pt with PEG, which was placed 2/9. Pt reports not being able to use it much since placement d/t N/V. She states she was previously able to eat PO but is unable to hold anything down. She states she does not feel ready to resume feeds through her PEG at this time. She would like to try some ginger ale today. Pt has been ordered scheduled IV Zofran every 8 hours and IV Phenergen PRN. Pt reports continues nausea since anti-emetics were ordered. Encouraged her to request Phenergen if it does not improve. Pt has also been ordered Reglan, will monitor for improvements in tolerance. Pt denies swallowing problems but states she has chewing issues d/t not having teeth. Pt states that prior to her symptoms she would eat softer foods. Pt is agreeable to seeing how the  medications help her N/V and resuming tube feeds tomorrow at a lower rate. Patient has been using Ensure through her tube. May need another formula if unable to tolerate Ensure.  Patient with little weight loss, weight has stayed relatively stable. Pt has had a BM. Pt presents with severe muscle depletion in arm region. Pt with severe malnutrition, please monitor for refeeding syndrome.  Labs reviewed: Low Na, K Mg/Phos WNL  Diet Order:  Diet full liquid Room service appropriate?: Yes; Fluid consistency:: Thin  Skin:  Reviewed, no issues  Last BM:  2/18  Height:   Ht Readings from Last 1 Encounters:  11/13/15 '5\' 6"'$  (1.676 m)    Weight:   Wt Readings from Last 1 Encounters:  11/13/15 165 lb 1.6 oz (74.889 kg)    Ideal Body Weight:  59.1 kg  BMI:  Body mass index is 26.66 kg/(m^2).  Estimated Nutritional Needs:   Kcal:  2100-2300  Protein:  110-120g  Fluid:  2.1L/day  EDUCATION NEEDS:   No education needs identified at this time  Clayton Bibles, MS, RD, LDN Pager: [phone] After Hours Pager: [phone]

## 2015-11-16 ENCOUNTER — Ambulatory Visit: Payer: 59

## 2015-11-16 ENCOUNTER — Ambulatory Visit
Admission: RE | Admit: 2015-11-16 | Discharge: 2015-11-16 | Disposition: A | Discharge status: Home / Self Care | Payer: 59 | Source: Ambulatory Visit | Attending: Radiation Oncology | Admitting: Radiation Oncology

## 2015-11-16 DIAGNOSIS — E878 Other disorders of electrolyte and fluid balance, not elsewhere classified: Secondary | ICD-10-CM

## 2015-11-16 DIAGNOSIS — D569 Thalassemia, unspecified: Secondary | ICD-10-CM

## 2015-11-16 LAB — TYPE AND SCREEN
ABO/RH(D): O POS
ANTIBODY SCREEN: NEGATIVE
UNIT DIVISION: 0
Unit division: 0

## 2015-11-16 LAB — CBC WITH DIFFERENTIAL/PLATELET
BASOS ABS: 0 10*3/uL (ref 0.0–0.1)
Basophils Relative: 0 %
EOS ABS: 0 10*3/uL (ref 0.0–0.7)
Eosinophils Relative: 0 %
HCT: 32.5 % — ABNORMAL LOW (ref 36.0–46.0)
Hemoglobin: 10.9 g/dL — ABNORMAL LOW (ref 12.0–15.0)
LYMPHS ABS: 0.5 10*3/uL — AB (ref 0.7–4.0)
Lymphocytes Relative: 5 %
MCH: 20.8 pg — ABNORMAL LOW (ref 26.0–34.0)
MCHC: 33.5 g/dL (ref 30.0–36.0)
MCV: 62.1 fL — ABNORMAL LOW (ref 78.0–100.0)
MONO ABS: 0.5 10*3/uL (ref 0.1–1.0)
MONOS PCT: 5 %
Neutro Abs: 8.7 10*3/uL — ABNORMAL HIGH (ref 1.7–7.7)
Neutrophils Relative %: 90 %
PLATELETS: 341 10*3/uL (ref 150–400)
RBC: 5.23 MIL/uL — AB (ref 3.87–5.11)
RDW: 18.4 % — AB (ref 11.5–15.5)
WBC: 9.7 10*3/uL (ref 4.0–10.5)

## 2015-11-16 LAB — HAPTOGLOBIN: HAPTOGLOBIN: 278 mg/dL — AB (ref 34–200)

## 2015-11-16 LAB — COMPREHENSIVE METABOLIC PANEL
ALT: 34 U/L (ref 14–54)
AST: 33 U/L (ref 15–41)
Albumin: 3.3 g/dL — ABNORMAL LOW (ref 3.5–5.0)
Alkaline Phosphatase: 98 U/L (ref 38–126)
Anion gap: 13 (ref 5–15)
BILIRUBIN TOTAL: 1.9 mg/dL — AB (ref 0.3–1.2)
BUN: 39 mg/dL — AB (ref 6–20)
CALCIUM: 8.3 mg/dL — AB (ref 8.9–10.3)
CO2: 28 mmol/L (ref 22–32)
CREATININE: 1.92 mg/dL — AB (ref 0.44–1.00)
Chloride: 89 mmol/L — ABNORMAL LOW (ref 101–111)
GFR calc Af Amer: 30 mL/min — ABNORMAL LOW (ref >60)
GFR, EST NON AFRICAN AMERICAN: 26 mL/min — AB (ref >60)
Glucose, Bld: 90 mg/dL (ref 65–99)
POTASSIUM: 2.6 mmol/L — AB (ref 3.5–5.1)
Sodium: 130 mmol/L — ABNORMAL LOW (ref 135–145)
TOTAL PROTEIN: 6.4 g/dL — AB (ref 6.5–8.1)

## 2015-11-16 LAB — MAGNESIUM: MAGNESIUM: 1.6 mg/dL — AB (ref 1.7–2.4)

## 2015-11-16 MED ORDER — MAGNESIUM SULFATE 2 GM/50ML IV SOLN
2.0000 g | Freq: Once | INTRAVENOUS | Status: AC
Start: 2015-11-16 — End: 2015-11-16
  Administered 2015-11-16: 2 g via INTRAVENOUS
  Filled 2015-11-16: qty 50

## 2015-11-16 MED ORDER — UNJURY CHICKEN SOUP POWDER
8.0000 [oz_av] | Freq: Two times a day (BID) | ORAL | Status: DC
  Administered 2015-11-16: 8 [oz_av] via ORAL
  Filled 2015-11-16 (×14): qty 27

## 2015-11-16 MED ORDER — GI COCKTAIL ~~LOC~~
30.0000 mL | Freq: Three times a day (TID) | ORAL | Status: DC | PRN
  Administered 2015-11-16 – 2015-11-17 (×2): 30 mL
  Filled 2015-11-16 (×2): qty 30

## 2015-11-16 MED ORDER — POTASSIUM CHLORIDE 10 MEQ/50ML IV SOLN
10.0000 meq | INTRAVENOUS | Status: AC
  Administered 2015-11-16 (×4): 10 meq via INTRAVENOUS
  Filled 2015-11-16 (×4): qty 50

## 2015-11-16 MED ORDER — HYDRALAZINE HCL 10 MG PO TABS
10.0000 mg | ORAL_TABLET | Freq: Three times a day (TID) | ORAL | Status: DC
  Administered 2015-11-16 – 2015-11-17 (×4): 10 mg via ORAL
  Filled 2015-11-16 (×4): qty 1

## 2015-11-16 MED ORDER — GI COCKTAIL ~~LOC~~: 30.0000 mL | Freq: Three times a day (TID) | ORAL | Status: DC | PRN

## 2015-11-16 NOTE — Progress Notes (Signed)
ALAYAH KITTEL   DOB:November 13, 1945   YQ#:[id number]    Subjective: Hospital admission day 4. Vomiting has stopped but she remained nauseated with burning sensation in her stomach. She is eager to try oral intake. Had a fall yesterday, felt to be attributed to medication side effects. Received blood transfusion on 11/15/2015. She had bowel movement yesterday Noted elevated blood pressure but denies headache  Objective:  Filed Vitals:   11/16/15 0130 11/16/15 0615  BP: 155/70 177/71  Pulse: 71 63  Temp: 98.8 F (37.1 C) 99.4 F (37.4 C)  Resp: 16 20     Intake/Output Summary (Last 24 hours) at 11/16/15 0751 Last data filed at 11/16/15 9528  Gross per 24 hour  Intake 2710.66 ml  Output    500 ml  Net 2210.66 ml    GENERAL:alert, no distress and comfortable SKIN: skin color, texture, turgor are normal, no rashes or significant lesions EYES: normal, Conjunctiva are pink and non-injected, sclera clear OROPHARYNX:no exudate, no erythema and lips, buccal mucosa, and tongue normal  NECK: supple, thyroid normal size, non-tender, without nodularity LYMPH:  Palpable lymphadenopathy on the side of the neck, unchanged  LUNGS: clear to auscultation and percussion with normal breathing effort HEART: regular rate & rhythm and no murmurs and no lower extremity edema ABDOMEN:abdomen soft, non-tender and normal bowel sounds. Feeding tube site looks okay Musculoskeletal:no cyanosis of digits and no clubbing  NEURO: alert & oriented x 3 with fluent speech, no focal motor/sensory deficits   Labs:  Lab Results  Component Value Date   WBC 9.7 11/16/2015   HGB 10.9* 11/16/2015   HCT 32.5* 11/16/2015   MCV 62.1* 11/16/2015   PLT 341 11/16/2015   NEUTROABS 8.7* 11/16/2015    Lab Results  Component Value Date   NA 130* 11/16/2015   K 2.6* 11/16/2015   CL 89* 11/16/2015   CO2 28 11/16/2015    Studies:  Dg Abd 2 Views  11/14/2015  CLINICAL DATA:  70 year old female with 2- 3 day history of  nausea/vomiting after chemotherapy for tongue cancer EXAM: ABDOMEN - 2 VIEW COMPARISON:  Prior PET-CT 11/05/2015 FINDINGS: Nonobstructive bowel gas pattern. Gastrostomy tube in good position overlying the gastric bubble. No acute bony abnormality. No evidence of free air on the decubitus view. Chronic parenchymal changes in the visualized lower lobes of the lungs. IMPRESSION: No evidence of bowel obstruction or free air. Gastrostomy tube appears to be in good position. Electronically Signed   By: Malachy Moan M.D.   On: 11/14/2015 10:16    Assessment & Plan:   Cancer of base of tongue (HCC) Unfortunately, she developed intractable nausea and vomiting, multifactorial, could be related to side effects of chemotherapy, compounded by severe gastritis and recent constipation Continue supportive care OK to resume radiation She may not be able to tolerate further chemotherapy  Intractable nausea and vomiting She is clinically dehydrated. She received IV dexamethasone for 2 days on 2/16 & 2/17 Continue IVF Continue IV antiemetics, scopolamine patch  Abdominal X ray is within normal limits  Gastritis, acute She has severe acute gastritis, exacerbating her nausea and vomiting. Continue IV protonix Added carafate  Hyponatremia Acute renal failure Due to dehydration Continue IVF resuscitation Monitor daily  Protein calorie malnutriton Poor oral intake Consult nutritionist On full liquid only now  Essential Hypertension Was on HCTZ at home/baseline. Due to dehydration, this is stopped Started on amlodipine, added hydralazine  Pain at surgical incision, resolved Her pain has improved with IV Dilaudid. Continue pain medication  as needed   Electrolyte imbalance with hypomagnesemia and hypokalemia Replacement started, monitor  Thalassemia, acute on chronic anemia Received blood transfusion on 11/15/15  Discharge planning  Not ready to go home due to unresolved nausea and poor oral  intake   Antwon Rochin, MD 11/16/2015  7:51 AM

## 2015-11-16 NOTE — Clinical Documentation Improvement (Signed)
Oncology  Can the diagnosis of Malnutrition be further specified?   Document Severity - Severe(third degree), Moderate (second degree), Mild (first degree)  Other condition  Unable to clinically determine  Document any associated diagnoses/conditions   Supporting Information: :  Protein calorie malnutrion per 02/18 progress notes.  Severe malnutrition related to acute illness as evidenced by energy intake < or equal to 50% for > or equal to 5 days, severe depletion of body fat per 02/19 RD's evaluation.     Please exercise your independent, professional judgment when responding. A specific answer is not anticipated or expected.   Thank You, [place] [phone]

## 2015-11-16 NOTE — Progress Notes (Signed)
CRITICAL VALUE ALERT  Critical value received:  K+ 2.6  Date of notification:  11/16/15  Time of notification:  0655  Critical value read back:Yes  Nurse who received alert: Harvest Dark, RN  MD notified (1st page): Lindi Adie  Time of first page:  [id number]  MD notified (2nd page): n./a  Time of second page: n/a  Responding MD:  Alvy Bimler, MD  Time MD responded:  [phone]

## 2015-11-16 NOTE — Progress Notes (Signed)
Nutrition Follow-up  DOCUMENTATION CODES:   Severe malnutrition in context of acute illness/injury  INTERVENTION:   Provide Unjury Chicken Soup BID, each packet provides 100 kcal and 21g of protein. RD to continue to follow for nutritional options  NUTRITION DIAGNOSIS:   Malnutrition related to acute illness as evidenced by energy intake < or equal to 50% for > or equal to 5 days, severe depletion of body fat.  Ongoing.  GOAL:   Patient will meet greater than or equal to 90% of their needs  Not meeting.  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 She is followed by Dr. Alvy Bimler and a request has been made for a [facility] for chemo and a g-tube. Of note, she does have thyroid nodules as well that were biopsied by Dr. Anselm Pancoast yesterday.  Patient in room with daughter at bedside. Pt reports not being able to tolerate any liquids today. She tried some jello, sherbet and cream soup which she said caused more nausea. She states she does not tolerate sweet foods very well. She received some Carafate and that worsened the nausea per pt's daughter. Pt refuses feedings through PEG today. She would like to try a savory nutritional supplement. RD to order Unjury Chicken soup. Pt states she tolerated unflavored Unjury PTA. Will monitor and follow-up for tolerance.  Need to continue to monitor for refeeding syndrome. Low Mg and K levels.  Medications: Reglan TID, Zofran TID  Labs reviewed: Low Na, K, Mg  Diet Order:  Diet full liquid Room service appropriate?: Yes; Fluid consistency:: Thin  Skin:  Reviewed, no issues  Last BM:  2/19  Height:   Ht Readings from Last 1 Encounters:  11/13/15 '5\' 6"'$  (1.676 m)    Weight:   Wt Readings from Last 1 Encounters:  11/13/15 165 lb 1.6 oz (74.889 kg)    Ideal Body Weight:  59.1 kg  BMI:  Body mass index is  26.66 kg/(m^2).  Estimated Nutritional Needs:   Kcal:  2100-2300  Protein:  110-120g  Fluid:  2.1L/day  EDUCATION NEEDS:   Education needs addressed  Clayton Bibles, MS, RD, LDN Pager: [phone] After Hours Pager: [phone]

## 2015-11-16 NOTE — Progress Notes (Signed)
[facility] Therapy Treatment Record Phone [phone]   Radiation Therapy was administered to Debra Farrell on: 11/16/2015  9:32 AM and was treatment # 4 out of a planned course of 35 treatments.

## 2015-11-17 ENCOUNTER — Encounter: Payer: Self-pay | Admitting: *Deleted

## 2015-11-17 ENCOUNTER — Inpatient Hospital Stay (HOSPITAL_COMMUNITY): Payer: 59

## 2015-11-17 ENCOUNTER — Ambulatory Visit
Admission: RE | Admit: 2015-11-17 | Discharge: 2015-11-17 | Disposition: A | Discharge status: Home / Self Care | Payer: 59 | Source: Ambulatory Visit | Attending: Radiation Oncology | Admitting: Radiation Oncology

## 2015-11-17 ENCOUNTER — Ambulatory Visit: Payer: 59

## 2015-11-17 ENCOUNTER — Encounter: Payer: Self-pay | Admitting: Radiation Oncology

## 2015-11-17 ENCOUNTER — Ambulatory Visit: Payer: Self-pay

## 2015-11-17 VITALS — BP 136/64 | HR 72 | Temp 98.9°F | Ht 66.0 in | Wt 157.1 lb

## 2015-11-17 DIAGNOSIS — C01 Malignant neoplasm of base of tongue: Secondary | ICD-10-CM

## 2015-11-17 LAB — COMPREHENSIVE METABOLIC PANEL
ALK PHOS: 87 U/L (ref 38–126)
ALT: 27 U/L (ref 14–54)
AST: 22 U/L (ref 15–41)
Albumin: 2.9 g/dL — ABNORMAL LOW (ref 3.5–5.0)
Anion gap: 10 (ref 5–15)
BUN: 36 mg/dL — ABNORMAL HIGH (ref 6–20)
CALCIUM: 7.9 mg/dL — AB (ref 8.9–10.3)
CO2: 27 mmol/L (ref 22–32)
CREATININE: 2.25 mg/dL — AB (ref 0.44–1.00)
Chloride: 88 mmol/L — ABNORMAL LOW (ref 101–111)
GFR calc non Af Amer: 21 mL/min — ABNORMAL LOW (ref >60)
GFR, EST AFRICAN AMERICAN: 25 mL/min — AB (ref >60)
GLUCOSE: 158 mg/dL — AB (ref 65–99)
Potassium: 2.7 mmol/L — CL (ref 3.5–5.1)
SODIUM: 125 mmol/L — AB (ref 135–145)
Total Bilirubin: 1.4 mg/dL — ABNORMAL HIGH (ref 0.3–1.2)
Total Protein: 5.8 g/dL — ABNORMAL LOW (ref 6.5–8.1)

## 2015-11-17 LAB — BASIC METABOLIC PANEL
Anion gap: 11 (ref 5–15)
BUN: 31 mg/dL — ABNORMAL HIGH (ref 6–20)
CALCIUM: 7.7 mg/dL — AB (ref 8.9–10.3)
CO2: 24 mmol/L (ref 22–32)
CREATININE: 2.12 mg/dL — AB (ref 0.44–1.00)
Chloride: 94 mmol/L — ABNORMAL LOW (ref 101–111)
GFR, EST AFRICAN AMERICAN: 26 mL/min — AB (ref >60)
GFR, EST NON AFRICAN AMERICAN: 23 mL/min — AB (ref >60)
Glucose, Bld: 106 mg/dL — ABNORMAL HIGH (ref 65–99)
Potassium: 3.7 mmol/L (ref 3.5–5.1)
SODIUM: 129 mmol/L — AB (ref 135–145)

## 2015-11-17 LAB — OSMOLALITY, URINE: Osmolality, Ur: 220 mOsm/kg — ABNORMAL LOW (ref 300–900)

## 2015-11-17 LAB — MAGNESIUM
MAGNESIUM: 1.7 mg/dL (ref 1.7–2.4)
Magnesium: 1.8 mg/dL (ref 1.7–2.4)

## 2015-11-17 LAB — URIC ACID: Uric Acid, Serum: 5.4 mg/dL (ref 2.3–6.6)

## 2015-11-17 LAB — SODIUM, URINE, RANDOM: SODIUM UR: 70 mmol/L

## 2015-11-17 MED ORDER — POTASSIUM CHLORIDE 10 MEQ/50ML IV SOLN
10.0000 meq | INTRAVENOUS | Status: AC
  Administered 2015-11-17 (×4): 10 meq via INTRAVENOUS
  Filled 2015-11-17 (×4): qty 50

## 2015-11-17 MED ORDER — SODIUM CHLORIDE 0.9 % IV SOLN
INTRAVENOUS | Status: DC
  Administered 2015-11-17 – 2015-11-18 (×2): via INTRAVENOUS
  Administered 2015-11-19 (×2): 1000 mL via INTRAVENOUS
  Administered 2015-11-20 – 2015-11-27 (×8): via INTRAVENOUS
  Administered 2015-11-28: 1000 mL via INTRAVENOUS
  Administered 2015-11-28 – 2015-11-29 (×2): via INTRAVENOUS

## 2015-11-17 MED ORDER — AMLODIPINE BESYLATE 5 MG PO TABS
5.0000 mg | ORAL_TABLET | Freq: Two times a day (BID) | ORAL | Status: DC
Start: 2015-11-17 — End: 2015-11-30
  Administered 2015-11-17 – 2015-11-30 (×26): 5 mg
  Filled 2015-11-17 (×27): qty 1

## 2015-11-17 MED ORDER — DEXTROSE-NACL 5-0.45 % IV SOLN
INTRAVENOUS | Status: DC
  Administered 2015-11-17: 16:00:00 via INTRAVENOUS

## 2015-11-17 MED ORDER — METOCLOPRAMIDE HCL 5 MG/ML IJ SOLN
5.0000 mg | Freq: Four times a day (QID) | INTRAMUSCULAR | Status: DC
  Administered 2015-11-17 – 2015-11-18 (×4): 5 mg via INTRAVENOUS
  Filled 2015-11-17 (×4): qty 2

## 2015-11-17 MED ORDER — HEPARIN SODIUM (PORCINE) 5000 UNIT/ML IJ SOLN
5000.0000 [IU] | Freq: Two times a day (BID) | INTRAMUSCULAR | Status: DC
  Administered 2015-11-17 – 2015-11-22 (×10): 5000 [IU] via SUBCUTANEOUS
  Filled 2015-11-17 (×11): qty 1

## 2015-11-17 MED ORDER — SODIUM CHLORIDE 0.9 % IV SOLN
INTRAVENOUS | Status: AC
  Administered 2015-11-17 (×2): via INTRAVENOUS

## 2015-11-17 MED ORDER — HYDRALAZINE HCL 10 MG PO TABS
10.0000 mg | ORAL_TABLET | Freq: Three times a day (TID) | ORAL | Status: DC
  Administered 2015-11-17 – 2015-11-30 (×38): 10 mg via ORAL
  Filled 2015-11-17 (×38): qty 1

## 2015-11-17 MED ORDER — POTASSIUM CHLORIDE 20 MEQ/15ML (10%) PO SOLN
20.0000 meq | Freq: Three times a day (TID) | ORAL | Status: AC
  Administered 2015-11-17 – 2015-11-18 (×6): 20 meq via ORAL
  Filled 2015-11-17 (×6): qty 15

## 2015-11-17 NOTE — Consult Note (Signed)
[facility] CM Inpatient Consult   11/17/2015  Debra Farrell May 25, 1946 [id number]   Went to bedside to visit patient on behalf of [facility] Care Management/ Link to Wellness program for [facility] employees/dependents with [facility] insurance. However, she was sleeping soundly. Did not want to disturb.   Raiford Noble, MSN-Ed, RN,BSN [facility] Liaison [phone]

## 2015-11-17 NOTE — Progress Notes (Signed)
Debra Farrell is here for her 5th fraction of radiation to her Neck. She is currently an inpatient admitted for intractable nausea/vomiting. She reports nausea when anything is in her mouth, or instilled into her feeding tube. She states she has improved slightly. She is attempting to drink and eat on the floor. She is drinking water, and chicken broth with protein added. She was able to eat a boiled egg and toast this morning with only slight nausea. She is receiving zofran around the clock in the hospital. Her neck is normal appearing, she has not yet started the cream, but states she will use it when discharged. She denies pain. She has no other concerns at this time.   BP 136/64 mmHg  Pulse 72  Temp(Src) 98.9 F (37.2 C)  Ht '5\' 6"'$  (1.676 m)  Wt 157 lb 1.6 oz (71.26 kg)  BMI 25.37 kg/m2  SpO2 100%   Wt Readings from Last 3 Encounters:  11/13/15 165 lb 1.6 oz (74.889 kg)  11/17/15 157 lb 1.6 oz (71.26 kg)  11/10/15 161 lb 11.2 oz (73.347 kg)

## 2015-11-17 NOTE — Progress Notes (Signed)
Nutrition Follow-up  DOCUMENTATION CODES:   Severe malnutrition in context of acute illness/injury  INTERVENTION:   Continue Unjury Chicken Soup BID- each packet provides 100 kcal and 21g of protein RD to continue to monitor  NUTRITION DIAGNOSIS:   Malnutrition related to acute illness as evidenced by energy intake < or equal to 50% for > or equal to 5 days, severe depletion of body fat.  Ongoing.  GOAL:   Patient will meet greater than or equal to 90% of their needs  Progressing.  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 She is followed by Dr. Alvy Bimler and a request has been made for a [facility] for chemo and a g-tube. Of note, she does have thyroid nodules as well that were biopsied by Dr. Anselm Pancoast yesterday.  Patient reports improvement with her nausea and denies any vomiting today. She states she has been eating better today. She did like the Unjury chicken soup supplement and ate 50%. Pt has not had more of the supplement today but is willing to have more. She states she ate a boiled egg and toast this morning with good tolerance. Pt does report some dryness in her throat when swallowing. She is still receiving XRT.  Will continue her supplements and monitor for further improvements in PO intake. Continues to decline feeds via PEG.  Medications: Reglan BID, Zofran TID Labs reviewed: Low Na Mg WNL  Diet Order:  DIET SOFT Room service appropriate?: Yes; Fluid consistency:: Thin  Skin:  Reviewed, no issues  Last BM:  2/19  Height:   Ht Readings from Last 1 Encounters:  11/13/15 '5\' 6"'$  (1.676 m)    Weight:   Wt Readings from Last 1 Encounters:  11/13/15 165 lb 1.6 oz (74.889 kg)    Ideal Body Weight:  59.1 kg  BMI:  Body mass index is 26.66 kg/(m^2).  Estimated Nutritional Needs:   Kcal:  2100-2300  Protein:  110-120g  Fluid:   2.1L/day  EDUCATION NEEDS:   Education needs addressed  Clayton Bibles, MS, RD, LDN Pager: [phone] After Hours Pager: [phone]

## 2015-11-17 NOTE — Progress Notes (Signed)
Radiation Oncology         ([phone]) [phone] ________________________________  Name: Debra Farrell MRN: [id number]  Date: 11/17/2015  DOB: [birth year]  (INPATIENT) Weekly Radiation Therapy Management  No diagnosis found.  Current Dose: 10 Gy     Planned Dose:  70 Gy  Narrative . . . . . . . . The patient presents for routine under treatment assessment. Debra Farrell is here for her 5th fraction of radiation to her Neck. She is currently an inpatient admitted for intractable nausea/vomiting. She reports nausea when anything is in her mouth or instilled into her feeding tube. She states she has improved slightly. She is attempting to drink and eat on the floor. She is drinking water and chicken broth with protein added. She was able to eat a boiled egg and toast this morning with slight nausea. She is receiving zofran around the clock in the hospital. She has yet to started the cream for her neck, but states she will use it when discharged. She denies pain. She has no other concerns at this time.                                 Set-up films were reviewed.                                 The chart was checked. Physical Findings. . .  height is 5\' 6"  (1.676 m) and weight is 157 lb 1.6 oz (71.26 kg). Her temperature is 98.9 F (37.2 C). Her blood pressure is 136/64 and her pulse is 72. Her oxygen saturation is 100%.   She presents to the clinic in a wheelchair. Mouth is dry with no secondary infections. Large right palpable neck mass. Impression . . . . . . . The patient is tolerating radiation. Remains in the hospital for intractable nausea/vomiting.  Plan . . . . . . . . . . . . Continue treatment as planned as an inpatient. ________________________________   Billie Lade, PhD, MD  This document serves as a record of services personally performed by Antony Blackbird, MD. It was created on his behalf by Eustace Moore, a trained medical scribe. The creation of this record is based on the scribe's  personal observations and the provider's statements to them. This document has been checked and approved by the attending provider.

## 2015-11-17 NOTE — Consult Note (Signed)
Renal Service Consult Note [facility]  Debra Farrell 11/17/2015 [place] Requesting Physician:  Dr. Alvy Bimler  Reason for Consult:  Acute renal failure HPI: The patient is a 70 y.o. year-old with hx of HTN, HL, thalassemia minor who presented on 2/17 with uncont N/V not responding to IVF and anti-emetics in the OP clinic.  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 Radiation was started 2/15 and chemoRx started on 2/15 as well, with the next dose due on 12/02/15. She was treated with high-dose cisplatin. PEG was placed on 11/05/15.   Creatinine has risen from 1.84 on admisssion to 1.92-2.25 this past 24 hours. Asked to see for AKI.    > Home meds are > tyl #3, HTCZ, decadron, EMLA cream, zofran tab, percocet prn, compazine prn, scopolamine prn , dilaudid IV , ativan IV, zofran, phenergan, Advil  > Meds here > norvasc, heparin SQ, hydralazine, reglan, magnesium, KCl, Protonix, KCl, carafate /   > CT neck done Jan 12th with 75 cc IV contrast  ROS  no SOB , cough, CP, no fever , chills, abd pain  Past Medical History  Past Medical History  Diagnosis Date  . Hypertension   . Hyperlipidemia   . Eczema   . Thalassemia minor   . Cancer of base of tongue ([place]) 10/14/2015  . Laceration 04/2014    around R ear, for falling out of bed & hitting nightstand  . Intractable nausea and vomiting 11/13/2015   Past Surgical History  Past Surgical History  Procedure Laterality Date  . Abdominal hysterectomy  1990    partial  . Incontinence surgery  1990, [id number]  . Rectocele repair    . Urocele      correction surgery  . Tonsillectomy      as a child  . Direct laryngoscopy N/A 10/28/2015    Procedure: DIRECT LARYNGOSCOPY WITH BIOPSY;  Surgeon: [name], MD;  Location: [place];  Service: ENT;  Laterality: N/A;  . Esophagoscopy N/A 10/28/2015    Procedure: ESOPHAGOSCOPY;  Surgeon: [name], MD;  Location: [place];  Service:  ENT;  Laterality: N/A;  . Laryngoscopy and bronchoscopy N/A 10/28/2015    Procedure: BRONCHOSCOPY;  Surgeon: [name], MD;  Location: [place];  Service: ENT;  Laterality: N/A;  . Multiple extractions with alveoloplasty N/A 10/28/2015    Procedure: Extraction of tooth #'s 2-12, 14,15,17,18,20-29, 31 with alveoloplasy and mandibular left torus reduction;  Surgeon: Lenn Cal, DDS;  Location: [place];  Service: Oral Surgery;  Laterality: N/A;   Family History  Family History  Problem Relation Age of Onset  . Cancer Mother     lung  . Asthma Father    Social History  reports that she has never smoked. She has never used smokeless tobacco. She reports that she does not drink alcohol or use illicit drugs. Allergies  Allergies  Allergen Reactions  . Morphine And Related Nausea And Vomiting   Home medications Prior to Admission medications   Medication Sig Start Date End Date Taking? Authorizing Provider  acetaminophen-codeine (TYLENOL #3) 300-30 MG tablet Take 1 tablet by mouth every 6 (six) hours as needed. pain 10/30/15  Yes Historical Provider, MD  dexamethasone (DECADRON) 4 MG tablet Take 2 tablets by mouth once a day on the day after chemotherapy and then take 2 tablets two times a day for 2 days. Take with food. 11/10/15  Yes [name], MD  hydrochlorothiazide (HYDRODIURIL) 25 MG tablet Take 25 mg by mouth daily.   Yes Historical Provider, MD  HYDROcodone-acetaminophen (NORCO/VICODIN) 5-325 MG tablet Take 1 tablet by mouth every 6 (six) hours as needed. pain 11/03/15  Yes Historical Provider, MD  ibuprofen (ADVIL,MOTRIN) 200 MG tablet Take 400 mg by mouth every 6 (six) hours as needed for moderate pain.   Yes Historical Provider, MD  lidocaine-prilocaine (EMLA) cream Apply to affected area once Patient taking differently: Apply 1 application topically daily as needed (port access). Apply to affected area once 11/10/15  Yes [name], MD  oxyCODONE-acetaminophen (PERCOCET) 10-325 MG tablet  Take 1 tablet by mouth every 4 (four) hours as needed for pain. 11/06/15  Yes [name], MD  prochlorperazine (COMPAZINE) 10 MG tablet Take 1 tablet (10 mg total) by mouth every 6 (six) hours as needed (Nausea or vomiting). 11/10/15  Yes [name], MD  scopolamine (TRANSDERM-SCOP) 1 MG/3DAYS Place 1 patch (1.5 mg total) onto the skin every 3 (three) days. 11/13/15  Yes [name], MD  ondansetron (ZOFRAN) 8 MG tablet Take 1 tablet (8 mg total) by mouth 2 (two) times daily as needed. Start on the third day after chemotherapy. Patient not taking: Reported on 11/13/2015 11/10/15   [name], MD   Liver Function Tests  Recent Labs Lab 11/15/15 0530 11/16/15 0600 11/17/15 0735  AST 34 33 22  ALT 34 34 27  ALKPHOS 92 98 87  BILITOT 1.3* 1.9* 1.4*  PROT 6.1* 6.4* 5.8*  ALBUMIN 3.1* 3.3* 2.9*   No results for input(s): LIPASE, AMYLASE in the last 168 hours. CBC  Recent Labs Lab 11/15/15 0530 11/16/15 0600  WBC 7.5 9.7  NEUTROABS 6.6 8.7*  HGB 7.6* 10.9*  HCT 22.8* 32.5*  MCV 58.5* 62.1*  PLT 337 440   Basic Metabolic Panel  Recent Labs Lab 11/14/15 0500 11/15/15 0530 11/16/15 0600 11/17/15 0735  NA 130* 128* 130* 125*  K 3.6 2.5* 2.6* 2.7*  CL 96* 92* 89* 88*  CO2 '22 26 28 27  '$ GLUCOSE 186* 105* 90 158*  BUN 52* 43* 39* 36*  CREATININE 1.84* 1.54* 1.92* 2.25*  CALCIUM 8.2* 8.0* 8.3* 7.9*  PHOS  --  3.2  --   --     Filed Vitals:   11/16/15 1425 11/16/15 2112 11/17/15 0519 11/17/15 1340  BP: 153/70 149/72 150/85 159/75  Pulse: 78 70 74 69  Temp: 99.3 F (37.4 C) 99.8 F (37.7 C) 99.5 F (37.5 C) 99.9 F (37.7 C)  TempSrc: Oral Oral Oral Oral  Resp: '20 19 18 16  '$ Height:      Weight:      SpO2: 98% 95% 98% 98%   Exam Alert  No rash, cyanosis or gangrene Sclera anicteric, throat clear No jvd, firm mass R ant neck Chest clear bilat RRR no MRG ABd soft ntnd no mass or ascites GU defer MS no joint changes Ext no LE or UE edema Neuro alert, ox 3  UA -  clear, 0-5 rbc, 6-30 wbc, 0-5 epi, prot neg Abd xray > PEG tube in good position  > Home meds were > tyl #3, HTCZ, decadron, EMLA cream, zofran tab, percocet prn, compazine prn, scopolamine prn , dilaudid IV , ativan IV, zofran, phenergan, Advil  > Meds here > norvasc, heparin SQ, hydralazine, reglan, magnesium, KCl, Protonix, KCl, carafate /   Assessment: 1 Acute kidney injury - suspect cisplatin renal injury; do not see another renal toxin (ACEi, nsaids, contrast, hypotension, etc.) in  this case.  2 Hypokalemia - check Mg, Mg-wasting typical for cisplatin tox 3 Squamous cell tongue cancer - stage IV-A.  4 Volume - no vol excess 5 HTN - bp's up in hospital, on norvasc/ hydral which are both new. Was taking HCTZ only at home   Plan - check Mg, hold cisplatinum. Changed IVF to D5 1/2NS  [place] pager [phone]    cell [phone] 11/17/2015, 3:30 PM

## 2015-11-17 NOTE — Progress Notes (Signed)
Debra Farrell   DOB:[birth year]   WU#:[id number]    Subjective: the patient is more alert. She had no further vomiting today. She tolerated some liquid diet yesterday. No bowel movement for 1 day. The burning sensation in her stomach is reduced. She is mildly frustrated because of the  bed alarm and wants to mobilize a little more  Objective:  Filed Vitals:   11/16/15 2112 11/17/15 0519  BP: 149/72 150/85  Pulse: 70 74  Temp: 99.8 F (37.7 C) 99.5 F (37.5 C)  Resp: 19 18     Intake/Output Summary (Last 24 hours) at 11/17/15 0920 Last data filed at 11/16/15 1300  Gross per 24 hour  Intake    240 ml  Output      0 ml  Net    240 ml    GENERAL:alert, no distress and comfortable SKIN: skin color, texture, turgor are normal, no rashes or significant lesions EYES: normal, Conjunctiva are pink and non-injected, sclera clear OROPHARYNX:no exudate, no erythema and lips, buccal mucosa, and tongue normal  NECK: supple, thyroid normal size, non-tender, without nodularity LYMPH:  She has persistent lymphadenopathy on the right side of the neck LUNGS: clear to auscultation and percussion with normal breathing effort HEART: regular rate & rhythm and no murmurs and no lower extremity edema ABDOMEN:abdomen soft, non-tender and normal bowel sounds. Feeding tube site looks okay Musculoskeletal:no cyanosis of digits and no clubbing  NEURO: alert & oriented x 3 with fluent speech, no focal motor/sensory deficits   Labs:  Lab Results  Component Value Date   WBC 9.7 11/16/2015   HGB 10.9* 11/16/2015   HCT 32.5* 11/16/2015   MCV 62.1* 11/16/2015   PLT 341 11/16/2015   NEUTROABS 8.7* 11/16/2015    Lab Results  Component Value Date   NA 125* 11/17/2015   K 2.7* 11/17/2015   CL 88* 11/17/2015   CO2 27 11/17/2015    Assessment & Plan:   Cancer of base of tongue (HCC) Unfortunately, she developed intractable nausea and vomiting, multifactorial, could be related to side effects of  chemotherapy, compounded by severe gastritis and recent constipation Continue supportive care OK to resume radiation She may not be able to tolerate further chemotherapy  Intractable nausea and vomiting, improving She is clinically dehydrated. She received IV dexamethasone for 2 days on 2/16 & 2/17 Continue IVF, I plan to give her bolus of 2 L over 4 hours in view of hyponatremia and worsening renal failure Continue IV antiemetics, scopolamine patch  I am reducing the dose of Reglan Abdominal X ray is within normal limits  Gastritis, acute She has severe acute gastritis, exacerbating her nausea and vomiting. Continue IV protonix She did not tolerate Carafate. We switched her to GI cocktail  Hyponatremia Acute renal failure Due to dehydration Continue IVF resuscitation Monitor daily I am ordering renal ultrasound, uric acid, repeating labs and consulting nephrology  Moderate protein calorie malnutriton Poor oral intake Consult nutritionist On full liquid only now. Advancing to soft diet  Essential Hypertension Was on HCTZ at home/baseline. Due to dehydration, this is stopped Started on amlodipine, added hydralazine  Pain at surgical incision, resolved Her pain has improved with IV Dilaudid. Discontinue pain medicine due to recent falls  Electrolyte imbalance with hypomagnesemia and hypokalemia Replacement started, monitor Rechecking later today  Thalassemia, acute on chronic anemia Received blood transfusion on 11/15/15  DVT prophylaxis Switching to heparin subcutaneous due to worsening renal failure  Fall precaution Discussed with charge nurse I have continued  lorazepam and Dilaudid Encourage ambulation Patient declined PT assessment  Discharge planning  Not ready to go home due to unresolved nausea, renal failure and poor oral intake   Debra Amero, MD 11/17/2015  9:20 AM

## 2015-11-18 ENCOUNTER — Ambulatory Visit: Payer: 59

## 2015-11-18 ENCOUNTER — Encounter: Payer: Self-pay | Admitting: Radiation Oncology

## 2015-11-18 ENCOUNTER — Other Ambulatory Visit: Payer: Self-pay

## 2015-11-18 ENCOUNTER — Ambulatory Visit
Admission: RE | Admit: 2015-11-18 | Discharge: 2015-11-18 | Disposition: A | Discharge status: Home / Self Care | Payer: 59 | Source: Ambulatory Visit | Attending: Radiation Oncology | Admitting: Radiation Oncology

## 2015-11-18 ENCOUNTER — Ambulatory Visit: Payer: Self-pay | Admitting: Hematology and Oncology

## 2015-11-18 DIAGNOSIS — E860 Dehydration: Secondary | ICD-10-CM

## 2015-11-18 DIAGNOSIS — E440 Moderate protein-calorie malnutrition: Secondary | ICD-10-CM

## 2015-11-18 DIAGNOSIS — D568 Other thalassemias: Secondary | ICD-10-CM

## 2015-11-18 LAB — CBC WITH DIFFERENTIAL/PLATELET
Basophils Absolute: 0 10*3/uL (ref 0.0–0.1)
Basophils Relative: 0 %
EOS PCT: 0 %
Eosinophils Absolute: 0 10*3/uL (ref 0.0–0.7)
HEMATOCRIT: 29.8 % — AB (ref 36.0–46.0)
Hemoglobin: 9.8 g/dL — ABNORMAL LOW (ref 12.0–15.0)
LYMPHS ABS: 0.4 10*3/uL — AB (ref 0.7–4.0)
Lymphocytes Relative: 3 %
MCH: 20.9 pg — ABNORMAL LOW (ref 26.0–34.0)
MCHC: 32.9 g/dL (ref 30.0–36.0)
MCV: 63.5 fL — AB (ref 78.0–100.0)
MONO ABS: 0.8 10*3/uL (ref 0.1–1.0)
MONOS PCT: 6 %
NEUTROS ABS: 11.3 10*3/uL — AB (ref 1.7–7.7)
Neutrophils Relative %: 91 %
PLATELETS: 215 10*3/uL (ref 150–400)
RBC: 4.69 MIL/uL (ref 3.87–5.11)
RDW: 18.7 % — AB (ref 11.5–15.5)
WBC: 12.5 10*3/uL — ABNORMAL HIGH (ref 4.0–10.5)

## 2015-11-18 LAB — COMPREHENSIVE METABOLIC PANEL
ALBUMIN: 2.8 g/dL — AB (ref 3.5–5.0)
ALT: 22 U/L (ref 14–54)
AST: 18 U/L (ref 15–41)
Alkaline Phosphatase: 75 U/L (ref 38–126)
Anion gap: 10 (ref 5–15)
BILIRUBIN TOTAL: 1.2 mg/dL (ref 0.3–1.2)
BUN: 30 mg/dL — AB (ref 6–20)
CHLORIDE: 98 mmol/L — AB (ref 101–111)
CO2: 24 mmol/L (ref 22–32)
CREATININE: 2.11 mg/dL — AB (ref 0.44–1.00)
Calcium: 8.3 mg/dL — ABNORMAL LOW (ref 8.9–10.3)
GFR calc Af Amer: 27 mL/min — ABNORMAL LOW (ref >60)
GFR calc non Af Amer: 23 mL/min — ABNORMAL LOW (ref >60)
GLUCOSE: 95 mg/dL (ref 65–99)
POTASSIUM: 3.2 mmol/L — AB (ref 3.5–5.1)
Sodium: 132 mmol/L — ABNORMAL LOW (ref 135–145)
TOTAL PROTEIN: 5.8 g/dL — AB (ref 6.5–8.1)

## 2015-11-18 MED ORDER — ONDANSETRON HCL 40 MG/20ML IJ SOLN
8.0000 mg | Freq: Three times a day (TID) | INTRAMUSCULAR | Status: DC | PRN
  Administered 2015-11-24 – 2015-11-30 (×4): 8 mg via INTRAVENOUS
  Filled 2015-11-18 (×10): qty 4

## 2015-11-18 MED ORDER — METOCLOPRAMIDE HCL 5 MG/5ML PO SOLN
5.0000 mg | Freq: Three times a day (TID) | ORAL | Status: DC
  Administered 2015-11-18 – 2015-11-30 (×44): 5 mg via ORAL
  Filled 2015-11-18 (×55): qty 5

## 2015-11-18 MED ORDER — MAGNESIUM OXIDE 400 (241.3 MG) MG PO TABS
400.0000 mg | ORAL_TABLET | Freq: Two times a day (BID) | ORAL | Status: DC
  Administered 2015-11-18 – 2015-11-30 (×25): 400 mg via ORAL
  Filled 2015-11-18 (×26): qty 1

## 2015-11-18 NOTE — Care Management Note (Signed)
Case Management Note  Patient Details  Name: ADDY MCMANNIS MRN: [id number] Date of Birth: 05/27/1946  Subjective/Objective:      70 yo admitted with Cancer at base of tongue and nausea/vomiting              Action/Plan: From home alone.  Expected Discharge Date:   (unknown)               Expected Discharge Plan:  Home/Self Care  In-House Referral:     Discharge planning Services  CM Consult  Post Acute Care Choice:    Choice offered to:     DME Arranged:    DME Agency:     HH Arranged:    HH Agency:     Status of Service:  In process, will continue to follow  Medicare Important Message Given:    Date Medicare IM Given:    Medicare IM give by:    Date Additional Medicare IM Given:    Additional Medicare Important Message give by:     If discussed at [place] of Stay Meetings, dates discussed:    Additional Comments: Pt had PEG placed prior to admission and was flushing it herself.  Chart reviewed and no CM needs identified or communicated at this time. CM will continue to follow. Marney Doctor RN,BSN,NCM [phone] Lynnell Catalan, RN 11/18/2015, 2:30 PM

## 2015-11-18 NOTE — Progress Notes (Signed)
[facility] Progress Note   Subjective:  Creat down 2.1 today.  Mg was 1.7, low end of normal range.   Filed Vitals:   11/17/15 1340 11/17/15 2104 11/18/15 0516 11/18/15 0635  BP: 159/75 152/71 155/68 136/53  Pulse: 69 78  76  Temp: 99.9 F (37.7 C) 99.9 F (37.7 C)  98.9 F (37.2 C)  TempSrc: Oral Oral  Oral  Resp: '16 16  16  '$ Height:      Weight:    68.947 kg (152 lb)  SpO2: 98% 100% 97% 99%    Inpatient medications: . amLODipine  5 mg Per Tube BID  . antiseptic oral rinse  7 mL Mouth Rinse q12n4p  . chlorhexidine  15 mL Mouth Rinse BID  . heparin subcutaneous  5,000 Units Subcutaneous Q12H  . hydrALAZINE  10 mg Oral 3 times per day  . magnesium oxide  400 mg Oral BID  . metoCLOPramide (REGLAN) injection  5 mg Intravenous 4 times per day  . ondansetron (ZOFRAN) IV  8 mg Intravenous 3 times per day  . pantoprazole sodium  40 mg Per Tube Daily  . potassium chloride  20 mEq Oral TID  . protein supplement  8 oz Oral BID WC  . scopolamine  1 patch Transdermal Q72H   . sodium chloride 75 mL/hr at 11/17/15 1643   acetaminophen, gi cocktail, lidocaine-prilocaine  Exam: Alert No jvd, firm mass R ant neck Chest clear bilat RRR no MRG ABd soft ntnd no mass or ascites GU defer Ext no LE or UE edema Neuro alert, ox 3  UA - clear, 0-5 rbc, 6-30 wbc, 0-5 epi, prot neg Abd xray > PEG tube in good position Renal US > 12-14 cm kidneys, no hydro, normal echotexture  > Home meds were > tyl #3, HTCZ, decadron, EMLA cream, zofran tab, percocet prn, compazine prn, scopolamine prn , dilaudid IV , ativan IV, zofran, phenergan, Advil  > Meds here > norvasc, heparin SQ, hydralazine, reglan, magnesium, KCl, Protonix, KCl, carafate /   Assessment: 1 Acute kidney injury - suspect cisplatinum renal injury +/- recent dehydration; do not see another renal toxin (no ACEi, nsaids, contrast, hypotension, sepsis, obstruction, etc.). Improving. Cont IVF.  2 Hypokalemia - poss  related to Mg wasting. Adding po Mg supp today.  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  4 Volume - no vol excess 5 HTN - bp's up in hospital, on norvasc/ hydral which are both new. Off home med (HCTZ).    Plan - as above   Kelly Splinter MD [facility] pager [phone]    cell [phone] 11/18/2015, 8:56 AM    Recent Labs Lab 11/15/15 0530  11/17/15 0735 11/17/15 1500 11/18/15 0430  NA 128*  < > 125* 129* 132*  K 2.5*  < > 2.7* 3.7 3.2*  CL 92*  < > 88* 94* 98*  CO2 26  < > '27 24 24  '$ GLUCOSE 105*  < > 158* 106* 95  BUN 43*  < > 36* 31* 30*  CREATININE 1.54*  < > 2.25* 2.12* 2.11*  CALCIUM 8.0*  < > 7.9* 7.7* 8.3*  PHOS 3.2  --   --   --   --   < > = values in this interval not displayed.  Recent Labs Lab 11/16/15 0600 11/17/15 0735 11/18/15 0430  AST 33 22 18  ALT 34 27 22  ALKPHOS 98 87 75  BILITOT 1.9* 1.4* 1.2  PROT 6.4* 5.8* 5.8*  ALBUMIN 3.3* 2.9* 2.8*    Recent Labs Lab 11/15/15 0530 11/16/15 0600 11/18/15 0430  WBC 7.5 9.7 12.5*  NEUTROABS 6.6 8.7* 11.3*  HGB 7.6* 10.9* 9.8*  HCT 22.8* 32.5* 29.8*  MCV 58.5* 62.1* 63.5*  PLT 337 341 215

## 2015-11-18 NOTE — Progress Notes (Signed)
Faxed paperwork (matrix) received for [employer] (caregiver) given to nurse 11/18/15 Ardeen Fillers)

## 2015-11-18 NOTE — Progress Notes (Signed)
Debra Farrell   DOB:1946/03/23   XB#:[id number]    Subjective: She felt a little better. Requesting diet to be advanced to full diet She is able to use take sips of water Denies swallowing difficulties Vomiting has resolved. She had mild nausea but tolerable   Objective:  Filed Vitals:   11/18/15 0516 11/18/15 0635  BP: 155/68 136/53  Pulse:  76  Temp:  98.9 F (37.2 C)  Resp:  16     Intake/Output Summary (Last 24 hours) at 11/18/15 1049 Last data filed at 11/18/15 1308  Gross per 24 hour  Intake    240 ml  Output   1450 ml  Net  -1210 ml    GENERAL:alert, no distress and comfortable SKIN: skin color, texture, turgor are normal, no rashes or significant lesions EYES: normal, Conjunctiva are pink and non-injected, sclera clear OROPHARYNX:no exudate, no erythema and lips, buccal mucosa, and tongue normal  NECK: supple, thyroid normal size, non-tender, without nodularity LYMPH:  She has palpable lymphadenopathy in her neck, unchanged  LUNGS: clear to auscultation and percussion with normal breathing effort HEART: regular rate & rhythm and no murmurs and no lower extremity edema ABDOMEN:abdomen soft, non-tender and normal bowel sounds. Feeding tube site looks okay Musculoskeletal:no cyanosis of digits and no clubbing  NEURO: alert & oriented x 3 with fluent speech, no focal motor/sensory deficits   Labs:  Lab Results  Component Value Date   WBC 12.5* 11/18/2015   HGB 9.8* 11/18/2015   HCT 29.8* 11/18/2015   MCV 63.5* 11/18/2015   PLT 215 11/18/2015   NEUTROABS 11.3* 11/18/2015    Lab Results  Component Value Date   NA 132* 11/18/2015   K 3.2* 11/18/2015   CL 98* 11/18/2015   CO2 24 11/18/2015    Studies:  US Renal  11/17/2015  CLINICAL DATA:  Acute renal failure EXAM: RENAL / URINARY TRACT ULTRASOUND COMPLETE COMPARISON:  None. FINDINGS: Right Kidney: Length: 13.7 cm. Echogenicity grossly within normal limits. No hydronephrosis. 12 mm cyst identified in the  interpolar region. Left Kidney: Length: 11.9 cm. Echogenicity within normal limits. No mass or hydronephrosis visualized. Bladder: Appears normal for degree of bladder distention. IMPRESSION: No evidence for hydronephrosis. Electronically Signed   By: Kennith Center M.D.   On: 11/17/2015 19:57    Assessment & Plan:   Cancer of base of tongue (HCC) Unfortunately, she developed intractable nausea and vomiting, multifactorial, could be related to side effects of chemotherapy, compounded by severe gastritis and recent constipation Continue supportive care OK to continue radiation She may not be able to tolerate further chemotherapy  Intractable nausea and vomiting, improving She is clinically dehydrated. She received IV dexamethasone for 2 days on 2/16 & 2/17 Continue scopolamine patch, switching Reglan to PO and IV Zofran to prn Abdominal X ray is within normal limits  Gastritis, acute She has severe acute gastritis, exacerbating her nausea and vomiting. Continue protonix She did not tolerate Carafate. We switched her to GI cocktail  Hyponatremia, improving Acute renal failure, stable Due to dehydration and recent cisplatin Continue IVF resuscitation Monitor daily Renal ultrasound is OK, uric acid OK  Moderate protein calorie malnutriton Poor oral intake Consult nutritionist Advancing to regular diet  Essential Hypertension Was on HCTZ at home/baseline. Due to dehydration, this is stopped Started on amlodipine, added hydralazine  Pain at surgical incision, resolved Her pain has improved with IV Dilaudid. Discontinue pain medicine due to recent falls  Electrolyte imbalance with hypomagnesemia and hypokalemia Replacement started, improving  Monitor daily  Thalassemia, acute on chronic anemia Received blood transfusion on 11/15/15  DVT prophylaxis Switching to heparin subcutaneous due to worsening renal failure  Fall precaution Discussed with charge nurse I have  continued lorazepam and Dilaudid Encourage ambulation Patient declined PT assessment  Discharge planning  Not ready to go home due to unresolved nausea, renal failure and poor oral intake Hopefully DC next 2 days  [facility], Chantee Cerino, MD 11/18/2015  10:49 AM

## 2015-11-18 NOTE — Progress Notes (Signed)
Reiterated safety education with patient this morning with nurse tech Janett Billow in the room.  Turned patient's bed alarm back on as it was turned off through the night.  Patient was encouraged to call for assistance with toileting and other daily activities.  Patient verbalized understanding. Will continue to monitor.

## 2015-11-19 ENCOUNTER — Telehealth: Payer: Self-pay | Admitting: Hematology and Oncology

## 2015-11-19 ENCOUNTER — Ambulatory Visit
Admit: 2015-11-19 | Discharge: 2015-11-19 | Disposition: A | Discharge status: Home / Self Care | Payer: 59 | Attending: Radiation Oncology | Admitting: Radiation Oncology

## 2015-11-19 ENCOUNTER — Other Ambulatory Visit: Payer: Self-pay | Admitting: Hematology and Oncology

## 2015-11-19 ENCOUNTER — Ambulatory Visit: Payer: 59

## 2015-11-19 ENCOUNTER — Telehealth: Payer: Self-pay | Admitting: *Deleted

## 2015-11-19 ENCOUNTER — Encounter: Payer: Self-pay | Admitting: Hematology and Oncology

## 2015-11-19 ENCOUNTER — Ambulatory Visit: Payer: Self-pay

## 2015-11-19 LAB — URINALYSIS, ROUTINE W REFLEX MICROSCOPIC
BILIRUBIN URINE: NEGATIVE
GLUCOSE, UA: NEGATIVE mg/dL
Hgb urine dipstick: NEGATIVE
Ketones, ur: NEGATIVE mg/dL
LEUKOCYTES UA: NEGATIVE
Nitrite: NEGATIVE
Protein, ur: NEGATIVE mg/dL
SPECIFIC GRAVITY, URINE: 1.009 (ref 1.005–1.030)
pH: 6.5 (ref 5.0–8.0)

## 2015-11-19 LAB — BASIC METABOLIC PANEL
Anion gap: 8 (ref 5–15)
BUN: 19 mg/dL (ref 6–20)
CHLORIDE: 97 mmol/L — AB (ref 101–111)
CO2: 26 mmol/L (ref 22–32)
CREATININE: 1.4 mg/dL — AB (ref 0.44–1.00)
Calcium: 7.8 mg/dL — ABNORMAL LOW (ref 8.9–10.3)
GFR calc non Af Amer: 38 mL/min — ABNORMAL LOW (ref >60)
GFR, EST AFRICAN AMERICAN: 44 mL/min — AB (ref >60)
Glucose, Bld: 130 mg/dL — ABNORMAL HIGH (ref 65–99)
Potassium: 3.2 mmol/L — ABNORMAL LOW (ref 3.5–5.1)
Sodium: 131 mmol/L — ABNORMAL LOW (ref 135–145)

## 2015-11-19 LAB — CBC
HCT: 28.8 % — ABNORMAL LOW (ref 36.0–46.0)
HEMOGLOBIN: 9.4 g/dL — AB (ref 12.0–15.0)
MCH: 20.8 pg — AB (ref 26.0–34.0)
MCHC: 32.6 g/dL (ref 30.0–36.0)
MCV: 63.7 fL — AB (ref 78.0–100.0)
Platelets: 193 10*3/uL (ref 150–400)
RBC: 4.52 MIL/uL (ref 3.87–5.11)
RDW: 18.7 % — ABNORMAL HIGH (ref 11.5–15.5)
WBC: 10.8 10*3/uL — ABNORMAL HIGH (ref 4.0–10.5)

## 2015-11-19 LAB — COMPREHENSIVE METABOLIC PANEL
ALBUMIN: 2.8 g/dL — AB (ref 3.5–5.0)
ALK PHOS: 73 U/L (ref 38–126)
ALT: 19 U/L (ref 14–54)
AST: 16 U/L (ref 15–41)
Anion gap: 11 (ref 5–15)
BILIRUBIN TOTAL: 1.2 mg/dL (ref 0.3–1.2)
BUN: 21 mg/dL — AB (ref 6–20)
CALCIUM: 8.1 mg/dL — AB (ref 8.9–10.3)
CO2: 25 mmol/L (ref 22–32)
Chloride: 98 mmol/L — ABNORMAL LOW (ref 101–111)
Creatinine, Ser: 1.63 mg/dL — ABNORMAL HIGH (ref 0.44–1.00)
GFR calc Af Amer: 36 mL/min — ABNORMAL LOW (ref >60)
GFR calc non Af Amer: 31 mL/min — ABNORMAL LOW (ref >60)
GLUCOSE: 93 mg/dL (ref 65–99)
Potassium: 3 mmol/L — ABNORMAL LOW (ref 3.5–5.1)
Sodium: 134 mmol/L — ABNORMAL LOW (ref 135–145)
TOTAL PROTEIN: 5.7 g/dL — AB (ref 6.5–8.1)

## 2015-11-19 LAB — MAGNESIUM: Magnesium: 1.3 mg/dL — ABNORMAL LOW (ref 1.7–2.4)

## 2015-11-19 MED ORDER — SODIUM CHLORIDE 0.9 % IV SOLN: INTRAVENOUS | Status: DC

## 2015-11-19 MED ORDER — VITAL AF 1.2 CAL PO LIQD
1000.0000 mL | ORAL | Status: DC
  Administered 2015-11-19: 1000 mL
  Filled 2015-11-19 (×2): qty 1000

## 2015-11-19 MED ORDER — JEVITY 1.2 CAL PO LIQD: 1000.0000 mL | ORAL | Status: DC

## 2015-11-19 MED ORDER — MAGNESIUM SULFATE 2 GM/50ML IV SOLN
2.0000 g | Freq: Once | INTRAVENOUS | Status: AC
  Administered 2015-11-19: 2 g via INTRAVENOUS
  Filled 2015-11-19: qty 50

## 2015-11-19 MED ORDER — POTASSIUM CHLORIDE 10 MEQ/50ML IV SOLN
10.0000 meq | INTRAVENOUS | Status: AC
Start: 2015-11-19 — End: 2015-11-19
  Administered 2015-11-19 (×4): 10 meq via INTRAVENOUS
  Filled 2015-11-19 (×4): qty 50

## 2015-11-19 MED ORDER — POTASSIUM CHLORIDE 20 MEQ/15ML (10%) PO SOLN
20.0000 meq | Freq: Two times a day (BID) | ORAL | Status: AC
  Administered 2015-11-19 – 2015-11-20 (×4): 20 meq via ORAL
  Filled 2015-11-19 (×4): qty 15

## 2015-11-19 NOTE — Progress Notes (Signed)
Debra Farrell   DOB:[birth year]   KX#:[id number]    Subjective: Overall, she feels better. She is able to tolerate some regular diet. She denies further vomiting. Has some mild nausea. Had one febrile episode last night. Urinalysis was clear. Blood cultures pending. CBC showed mild leukocytosis but improved compared to prior result. Denies sore throat, mucositis or diarrhea. No cough. No sinus drainage. Denies chills.  Objective:  Filed Vitals:   11/18/15 2234 11/19/15 0642  BP:  163/72  Pulse: 80 84  Temp: 100.5 F (38.1 C) 99.3 F (37.4 C)  Resp:  18     Intake/Output Summary (Last 24 hours) at 11/19/15 1696 Last data filed at 11/19/15 7893  Gross per 24 hour  Intake 3272.75 ml  Output    375 ml  Net 2897.75 ml    GENERAL:alert, no distress and comfortable SKIN: skin color, texture, turgor are normal, no rashes or significant lesions EYES: normal, Conjunctiva are pink and non-injected, sclera clear OROPHARYNX:no exudate, no erythema and lips, buccal mucosa, and tongue normal  NECK: supple, thyroid normal size, non-tender, without nodularity LYMPH:  She has persistent lymphadenopathy in the neck, unchanged compared to prior visit  LUNGS: clear to auscultation and percussion with normal breathing effort HEART: regular rate & rhythm and no murmurs and no lower extremity edema ABDOMEN:abdomen soft, non-tender and normal bowel sounds. Feeding tube site looks okay Musculoskeletal:no cyanosis of digits and no clubbing  NEURO: alert & oriented x 3 with fluent speech, no focal motor/sensory deficits   Labs:  Lab Results  Component Value Date   WBC 10.8* 11/19/2015   HGB 9.4* 11/19/2015   HCT 28.8* 11/19/2015   MCV 63.7* 11/19/2015   PLT 193 11/19/2015   NEUTROABS 11.3* 11/18/2015    Lab Results  Component Value Date   NA 134* 11/19/2015   K 3.0* 11/19/2015   CL 98* 11/19/2015   CO2 25 11/19/2015    Studies:  US Renal  11/17/2015  CLINICAL DATA:  Acute renal failure  EXAM: RENAL / URINARY TRACT ULTRASOUND COMPLETE COMPARISON:  None. FINDINGS: Right Kidney: Length: 13.7 cm. Echogenicity grossly within normal limits. No hydronephrosis. 12 mm cyst identified in the interpolar region. Left Kidney: Length: 11.9 cm. Echogenicity within normal limits. No mass or hydronephrosis visualized. Bladder: Appears normal for degree of bladder distention. IMPRESSION: No evidence for hydronephrosis. Electronically Signed   By: Kennith Center M.D.   On: 11/17/2015 19:57    Assessment & Plan:   Cancer of base of tongue (HCC) Unfortunately, she developed intractable nausea and vomiting, multifactorial, could be related to side effects of chemotherapy, compounded by severe gastritis and recent constipation Continue supportive care OK to continue radiation She may not be able to tolerate further chemotherapy  Intractable nausea and vomiting, improving She is clinically dehydrated. She received IV dexamethasone for 2 days on 2/16 & 2/17 Continue scopolamine patch, switching Reglan to PO and IV Zofran to prn Abdominal X ray is within normal limits  Gastritis, acute She has severe acute gastritis, exacerbating her nausea and vomiting. Continue protonix She did not tolerate Carafate. We switched her to GI cocktail  Hyponatremia, improving Acute renal failure, improving Due to dehydration and recent cisplatin Continue IVF resuscitation Monitor daily Renal ultrasound is OK, uric acid OK  Moderate protein calorie malnutriton Poor oral intake, improving Consult nutritionist Advancing to regular diet  Essential Hypertension Was on HCTZ at home/baseline. Due to dehydration, this is stopped Started on amlodipine, added hydralazine  Pain at surgical incision,  resolved Her pain has improved with IV Dilaudid. Discontinue pain medicine due to recent falls  Electrolyte imbalance with hypomagnesemia and hypokalemia Replacement started, improving Monitor daily Planning to  recheck again later today  Thalassemia, acute on chronic anemia Received blood transfusion on 11/15/15  DVT prophylaxis Switching to heparin subcutaneous due to worsening renal failure  Fall precaution Discussed with charge nurse I have continued lorazepam and Dilaudid Encourage ambulation Patient declined PT assessment  Febrile episode The patient is not neutropenic She has no obvious signs or location of infection Urinalysis is clear No antibiotics is needed at this point and this should not hold the plan for discharge  Discharge planning  I plan to recheck her blood work again later today If her renal profile continues to improve and she is no longer febrile, I plan to discharge her with outpatient follow-up However, she have recurrence of fever or if her renal profile is not better, she may have to stay at least until tomorrow   [facility], Sybilla Malhotra, MD 11/19/2015  8:12 AM

## 2015-11-19 NOTE — Progress Notes (Signed)
[facility] Progress Note   Subjective:  Creat down 1.63 today.  K 3.0.    Filed Vitals:   11/18/15 1558 11/18/15 2211 11/18/15 2234 11/19/15 0642  BP: 151/69 144/52  163/72  Pulse: 75 78 80 84  Temp: 99.5 F (37.5 C) 101.1 F (38.4 C) 100.5 F (38.1 C) 99.3 F (37.4 C)  TempSrc: Oral Oral Oral Oral  Resp: '18 18  18  '$ Height:      Weight:    65.772 kg (145 lb)  SpO2: 98% 99%  97%    Inpatient medications: . amLODipine  5 mg Per Tube BID  . antiseptic oral rinse  7 mL Mouth Rinse q12n4p  . chlorhexidine  15 mL Mouth Rinse BID  . heparin subcutaneous  5,000 Units Subcutaneous Q12H  . hydrALAZINE  10 mg Oral 3 times per day  . magnesium oxide  400 mg Oral BID  . magnesium sulfate 1 - 4 g bolus IVPB  2 g Intravenous Once  . metoCLOPramide  5 mg Oral TID AC & HS  . pantoprazole sodium  40 mg Per Tube Daily  . potassium chloride  10 mEq Intravenous Q1 Hr x 4  . potassium chloride  20 mEq Oral BID  . protein supplement  8 oz Oral BID WC  . scopolamine  1 patch Transdermal Q72H   . sodium chloride 1,000 mL (11/19/15 0611)  . sodium chloride     acetaminophen, gi cocktail, lidocaine-prilocaine, ondansetron (ZOFRAN) IV  Exam: Alert No jvd, firm mass R ant neck Chest clear bilat RRR no MRG ABd soft ntnd no mass or ascites GU defer Ext no LE or UE edema Neuro alert, ox 3  UA - clear, 0-5 rbc, 6-30 wbc, 0-5 epi, prot neg Abd xray > PEG tube in good position Renal US > 12-14 cm kidneys, no hydro, normal echotexture  > Home meds were > tyl #3, HTCZ, decadron, EMLA cream, zofran tab, percocet prn, compazine prn, scopolamine prn , dilaudid IV , ativan IV, zofran, phenergan, Advil  > Meds here > norvasc, heparin SQ, hydralazine, reglan, magnesium, KCl, Protonix, KCl, carafate /   Assessment/Plan: 1 Acute kidney injury - prob cisplatinum renal injury +/- recent dehydration.  Improving creatinine. Avoid cisplatinum. No other suggestions, will sign off.  2  Hypokalemia - poss related to Mg wasting. Adding po Mg supp today.  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  4 Volume - no vol excess 5 HTN - bp's up in hospital, on norvasc/ hydral which are both new. Off home med (HCTZ).     Kelly Splinter MD [facility] pager [phone]    cell [phone] 11/19/2015, 8:35 AM    Recent Labs Lab 11/15/15 0530  11/17/15 1500 11/18/15 0430 11/19/15 0600  NA 128*  < > 129* 132* 134*  K 2.5*  < > 3.7 3.2* 3.0*  CL 92*  < > 94* 98* 98*  CO2 26  < > '24 24 25  '$ GLUCOSE 105*  < > 106* 95 93  BUN 43*  < > 31* 30* 21*  CREATININE 1.54*  < > 2.12* 2.11* 1.63*  CALCIUM 8.0*  < > 7.7* 8.3* 8.1*  PHOS 3.2  --   --   --   --   < > = values in this interval not displayed.  Recent Labs Lab 11/17/15 0735 11/18/15 0430 11/19/15 0600  AST '22 18 16  '$ ALT '27 22 19  '$ ALKPHOS 87 75 73  BILITOT 1.4*  1.2 1.2  PROT 5.8* 5.8* 5.7*  ALBUMIN 2.9* 2.8* 2.8*    Recent Labs Lab 11/15/15 0530 11/16/15 0600 11/18/15 0430 11/19/15 0600  WBC 7.5 9.7 12.5* 10.8*  NEUTROABS 6.6 8.7* 11.3*  --   HGB 7.6* 10.9* 9.8* 9.4*  HCT 22.8* 32.5* 29.8* 28.8*  MCV 58.5* 62.1* 63.5* 63.7*  PLT 337 341 215 193

## 2015-11-19 NOTE — Progress Notes (Signed)
Patient with fever earlier of max 101.1. Paged MD on call orders received for bld cx and labs.

## 2015-11-19 NOTE — Telephone Encounter (Signed)
per pof to sch pt appt-sent MW email to sch trmt-will call pt after reply

## 2015-11-19 NOTE — Progress Notes (Signed)
forms left in box °

## 2015-11-19 NOTE — Progress Notes (Signed)
Nutrition Follow-up  DOCUMENTATION CODES:   Severe malnutrition in context of acute illness/injury  INTERVENTION:   Initiate Vital AF 1.2 @ 10 ml/hr.  Based on tolerance will advance further. Will need to monitor for refeeding syndrome.  TF goal w/ PO intake: Vital AF 1.2 @ 60 ml/hr over 18 hour infusion. Recommend 30 ml Prostat BID. This regimen provides 1496 kcal (71% of needs), 111g protein (100% of needs) and 876 ml H2O.   Will continue to encourage PO intakes. RD to continue to monitor  NUTRITION DIAGNOSIS:   Malnutrition related to acute illness as evidenced by energy intake < or equal to 50% for > or equal to 5 days, severe depletion of body fat.  Ongoing.  GOAL:   Patient will meet greater than or equal to 90% of their needs  Progressing but not meeting protein needs.  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 She is followed by Dr. Alvy Bimler and a request has been made for a [facility] for chemo and a g-tube. Of note, she does have thyroid nodules as well that were biopsied by Dr. Anselm Pancoast yesterday.  Spoke with pt at bedside, nurse tech in room. Both pt and tech reports pt ate this morning. For breakfast she had 2 blueberry muffins, peaches and an [place] ice (provided 505 kcal, 4g of protein). Very low protein breakfast. Pt reports protein foods and supplements cause discomfort and nausea. Discussed with patient the importance of protein intake and she is agreeable to starting PEG feeds at a low rate. Goal reflects if patient continues to consume some PO intake.  Spoke with Dr. Alvy Bimler regarding starting feeds through PEG. Pt has been refusing feeds until this morning. Recommended continuous infusion of Vital AF 1.2. MD verbally consented for RD to begin TF.   RD will continue to monitor for tolerance and ability to advance.  Labs reviewed: Low  Na, K, Mg  Diet Order:  Diet regular Room service appropriate?: Yes; Fluid consistency:: Thin  Skin:  Reviewed, no issues  Last BM:  2/22  Height:   Ht Readings from Last 1 Encounters:  11/13/15 '5\' 6"'$  (1.676 m)    Weight:   Wt Readings from Last 1 Encounters:  11/19/15 145 lb (65.772 kg)    Ideal Body Weight:  59.1 kg  BMI:  Body mass index is 23.41 kg/(m^2).  Estimated Nutritional Needs:   Kcal:  2100-2300  Protein:  110-120g  Fluid:  2.1L/day  EDUCATION NEEDS:   Education needs addressed  Clayton Bibles, MS, RD, LDN Pager: [phone] After Hours Pager: [phone]

## 2015-11-19 NOTE — Telephone Encounter (Signed)
Per staff message and POF I have scheduled appts. Advised scheduler of appts and no available on 2/27. JMW

## 2015-11-19 NOTE — Progress Notes (Signed)
This is an addendum to my progress note this morning:   I spoke with the dietitian. She recommended continuous tube feeding before switching over to bolus feeding. When I saw the patient at 5 pm, she is at 10 mL an hour and so far tolerated that well without further nausea. She did not get radiation treatment today because the machine is down. She had another recurrence of fever that resolve spontaneously. Blood cultures are pending Overall, the plan would be to keep the patient over the weekend until she can tolerate goal rate of tube feeding and then switch over to intermittent tube feeds. The patient is at risk of refeeding syndrome and will monitor electrolytes daily over the weekend and she will receive appropriate replacement therapy as needed We will follow blood culture closely. I do not feel strong she needed antibiotic therapy today

## 2015-11-20 ENCOUNTER — Encounter: Payer: Self-pay | Admitting: *Deleted

## 2015-11-20 ENCOUNTER — Encounter: Payer: Self-pay | Admitting: Nutrition

## 2015-11-20 ENCOUNTER — Ambulatory Visit
Admission: RE | Admit: 2015-11-20 | Discharge: 2015-11-20 | Disposition: A | Discharge status: Home / Self Care | Payer: 59 | Source: Ambulatory Visit | Attending: Radiation Oncology | Admitting: Radiation Oncology

## 2015-11-20 ENCOUNTER — Encounter: Payer: Self-pay | Admitting: Hematology and Oncology

## 2015-11-20 ENCOUNTER — Ambulatory Visit: Payer: Self-pay

## 2015-11-20 ENCOUNTER — Ambulatory Visit: Payer: 59

## 2015-11-20 DIAGNOSIS — E876 Hypokalemia: Secondary | ICD-10-CM | POA: Insufficient documentation

## 2015-11-20 DIAGNOSIS — E860 Dehydration: Secondary | ICD-10-CM | POA: Insufficient documentation

## 2015-11-20 DIAGNOSIS — N179 Acute kidney failure, unspecified: Secondary | ICD-10-CM | POA: Insufficient documentation

## 2015-11-20 LAB — PHOSPHORUS: Phosphorus: 2.3 mg/dL — ABNORMAL LOW (ref 2.5–4.6)

## 2015-11-20 LAB — COMPREHENSIVE METABOLIC PANEL
ALBUMIN: 2.7 g/dL — AB (ref 3.5–5.0)
ALK PHOS: 76 U/L (ref 38–126)
ALT: 19 U/L (ref 14–54)
ANION GAP: 8 (ref 5–15)
AST: 20 U/L (ref 15–41)
BILIRUBIN TOTAL: 1 mg/dL (ref 0.3–1.2)
BUN: 15 mg/dL (ref 6–20)
CALCIUM: 8.2 mg/dL — AB (ref 8.9–10.3)
CO2: 26 mmol/L (ref 22–32)
CREATININE: 1.11 mg/dL — AB (ref 0.44–1.00)
Chloride: 98 mmol/L — ABNORMAL LOW (ref 101–111)
GFR calc Af Amer: 58 mL/min — ABNORMAL LOW (ref >60)
GFR calc non Af Amer: 50 mL/min — ABNORMAL LOW (ref >60)
GLUCOSE: 118 mg/dL — AB (ref 65–99)
Potassium: 3.1 mmol/L — ABNORMAL LOW (ref 3.5–5.1)
Sodium: 132 mmol/L — ABNORMAL LOW (ref 135–145)
TOTAL PROTEIN: 5.6 g/dL — AB (ref 6.5–8.1)

## 2015-11-20 LAB — MAGNESIUM: Magnesium: 1.5 mg/dL — ABNORMAL LOW (ref 1.7–2.4)

## 2015-11-20 MED ORDER — VITAL AF 1.2 CAL PO LIQD
1000.0000 mL | ORAL | Status: AC
  Administered 2015-11-20 – 2015-11-22 (×3): 1000 mL
  Filled 2015-11-20 (×4): qty 1000

## 2015-11-20 MED ORDER — PRO-STAT SUGAR FREE PO LIQD
30.0000 mL | Freq: Two times a day (BID) | ORAL | Status: DC
  Administered 2015-11-20 – 2015-11-23 (×7): 30 mL
  Filled 2015-11-20 (×8): qty 30

## 2015-11-20 MED ORDER — POTASSIUM CHLORIDE 10 MEQ/50ML IV SOLN
10.0000 meq | INTRAVENOUS | Status: AC
  Administered 2015-11-20 (×6): 10 meq via INTRAVENOUS
  Filled 2015-11-20: qty 50

## 2015-11-20 MED ORDER — MAGNESIUM SULFATE 2 GM/50ML IV SOLN
2.0000 g | Freq: Once | INTRAVENOUS | Status: AC
  Administered 2015-11-20: 2 g via INTRAVENOUS
  Filled 2015-11-20: qty 50

## 2015-11-20 MED ORDER — NYSTATIN 100000 UNIT/ML MT SUSP
5.0000 mL | Freq: Four times a day (QID) | OROMUCOSAL | Status: DC
  Administered 2015-11-20 – 2015-11-25 (×17): 500000 [IU] via ORAL
  Filled 2015-11-20 (×16): qty 5

## 2015-11-20 NOTE — Progress Notes (Signed)
Debra Farrell   DOB:Jun 20, 1946   ZO#:[id number]    INPATIENT PROGRESS NOTE  DOS 11/20/2015  Subjective:   Patient notes that overall she is feeling well. Had some low-grade fevers again this afternoon. No throat pain or discomfort. Noted to have white exudates concerning for thrush started on nystatin. Blood cultures still pending. Other obvious focal symptoms. Kidney function better. Electrolytes still requiring aggressive replacement. On continuous tube feeding. In good spirits overall.   Objective:   . Patient Vitals for the past 24 hrs:  BP Temp Temp src Pulse Resp SpO2  11/20/15 1309 (!) 134/56 mmHg (!) 100.7 F (38.2 C) Oral 81 16 93 %  11/20/15 0558 127/60 mmHg 99.7 F (37.6 C) Oral 93 18 95 %  11/19/15 2235 - - - 82 - -  11/19/15 2231 (!) 154/69 mmHg 99.9 F (37.7 C) Oral 83 - -  11/19/15 2100 (!) 125/49 mmHg (!) 100.8 F (38.2 C) Oral 83 18 97 %     Intake/Output Summary (Last 24 hours) at 11/20/15 1521 Last data filed at 11/20/15 9811  Gross per 24 hour  Intake 2656.75 ml  Output   1100 ml  Net 1556.75 ml    GENERAL:alert, no distress and comfortable SKIN: skin color, texture, turgor are normal, no rashes or significant lesions EYES: normal, Conjunctiva are pink and non-injected, sclera clear OROPHARYNX: Oral thrush noted on the tongue and pharynx. LYMPH:  Cervical adenopathy noted LUNGS: clear to auscultation  HEART: regular rate & rhythm and no murmurs and no lower extremity edema ABDOMEN:abdomen soft, non-tender and normal bowel sounds. Feeding tube site looks okay Musculoskeletal:no cyanosis of digits and no clubbing  NEURO: alert & oriented x 3 with fluent speech, no focal motor/sensory deficits   Labs:  Marland Kitchen CBC Latest Ref Rng 11/19/2015 11/18/2015 11/16/2015  WBC 4.0 - 10.5 K/uL 10.8(H) 12.5(H) 9.7  Hemoglobin 12.0 - 15.0 g/dL 9.1(Y) 7.8(G) 10.9(L)  Hematocrit 36.0 - 46.0 % 28.8(L) 29.8(L) 32.5(L)  Platelets 150 - 400 K/uL 193 215 341    . CMP Latest  Ref Rng 11/20/2015 11/19/2015 11/19/2015  Glucose 65 - 99 mg/dL 956(O) 130(Q) 93  BUN 6 - 20 mg/dL 15 19 65(H)  Creatinine 0.44 - 1.00 mg/dL 8.46(N) 6.29(B) 2.84(X)  Sodium 135 - 145 mmol/L 132(L) 131(L) 134(L)  Potassium 3.5 - 5.1 mmol/L 3.1(L) 3.2(L) 3.0(L)  Chloride 101 - 111 mmol/L 98(L) 97(L) 98(L)  CO2 22 - 32 mmol/L 26 26 25   Calcium 8.9 - 10.3 mg/dL 8.2(L) 7.8(L) 8.1(L)  Total Protein 6.5 - 8.1 g/dL 3.2(G) - 5.7(L)  Total Bilirubin 0.3 - 1.2 mg/dL 1.0 - 1.2  Alkaline Phos 38 - 126 U/L 76 - 73  AST 15 - 41 U/L 20 - 16  ALT 14 - 54 U/L 19 - 19   . Marland Kitchen amLODipine  5 mg Per Tube BID  . antiseptic oral rinse  7 mL Mouth Rinse q12n4p  . chlorhexidine  15 mL Mouth Rinse BID  . feeding supplement (PRO-STAT SUGAR FREE 64)  30 mL Per Tube BID  . heparin subcutaneous  5,000 Units Subcutaneous Q12H  . hydrALAZINE  10 mg Oral 3 times per day  . magnesium oxide  400 mg Oral BID  . metoCLOPramide  5 mg Oral TID AC & HS  . nystatin  5 mL Oral QID  . pantoprazole sodium  40 mg Per Tube Daily  . potassium chloride  10 mEq Intravenous Q1 Hr x 6  . potassium chloride  20 mEq  Oral BID  . protein supplement  8 oz Oral BID WC  . scopolamine  1 patch Transdermal Q72H   .acetaminophen, gi cocktail, lidocaine-prilocaine, ondansetron (ZOFRAN) IV   Studies:  No results found.  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 Received her first cycle of cisplatin 11/11/2015 .getting daily radiation therapy . Had significant issues with her cycle of cisplatin including acute renal failure and electrolyte wasting . Plan  -Continue radiation therapy as per plan . -Dr. Bertis Ruddy will decide on adjusting additional cisplatin use vs switching to Erbitux.  Intractable nausea and vomiting- much improved at this time. Was likely multifactorial from acute gastritis, chemotherapy, radiation and anxiety. Abdominal x-ray with no evidence of bowel  obstruction. Plan -Continue scopolamine patch, Reglan PO and IV Zofran to prn -On Protonix for gastritis. -We will adjust laxatives to maintain appropriate bowel function.  Hyponatremia, improving Acute renal failure, improving. Renal ultrasound is OK, uric acid OK Due to dehydration and recent cisplatin Plan -Continue IVF resuscitation -Monitor daily   Moderate protein calorie malnutriton Poor oral intake, improving Plan  -advancing continuous tube feeding as per dietitian input. -Additionally can have a regular diet orally.  Essential Hypertension On amlodipine, added hydralazine -Diuretics on hold due to dehydration and poor oral intake .  Electrolyte imbalance with hypomagnesemia and hypokalemia likely due to renal wasting in the setting of cisplatin use . Plan -An aggressive replacement  Thalassemia Minor, acute on chronic anemia Received blood transfusion on 11/15/15 -Transit when necessary for hemoglobin less than 8.  DVT prophylaxis -On subcutaneous heparin due to renal insufficiency will switch to Lovenox if renal function remained stable/improved tomorrow .  Fall precaution Discussed with charge nurse I have continued lorazepam and Dilaudid Encourage ambulation Patient declined PT assessment  Febrile episode low-grade fever today as well .concern for oral thrush on examination .blood cultures pending  Negative UA . Could be from dehydration/low-level mucositis from radiation therapy. Plan -We'll treat with nystatin 4 times a day for 14 days for presumed oral thrush. -Follow-up blood cultures  We'll plan to follow over the weekend .Dr. Bertis Ruddy will take over her care on Monday . The patient would likely be able to discharge early next week once her nutritional plan is finalized and she is tolerating it without significant nausea and isn't needing aggressive IV electrolyte replacement.  [name], MD 11/20/2015  3:18 PM

## 2015-11-20 NOTE — Progress Notes (Signed)
Utilization review completed.  

## 2015-11-20 NOTE — Progress Notes (Signed)
  Oncology Nurse Navigator Documentation  Navigator Location: [facility] (11/20/15 1505)             Patient Visit Type: Inpatient (11/20/15 1505) Treatment Phase: Active Tx (11/20/15 1505)       Visited Debra Farrell in [place] to check on her well-being.  Her dtr was at the bedside.  She reported feeling better since admission last week Friday.   She expressed frustration with:  The changing times for her XRT.  I explained that her previously scheduled early morning XRTs will be adjusted to insure her presence on the unit when Drs and other clinicians come by to see her.    The floor-level bed that she has had since her fall event.  I explained the lower bed and floor mat are in place for her safety.   She voiced understanding of my explanations.  She understands I will continue to check on her during this admission.  Gayleen Orem, RN, BSN, [place] [facility] [place] [phone]                         Time Spent with Patient: 15 (11/20/15 1505)

## 2015-11-20 NOTE — Progress Notes (Signed)
  Oncology Nurse Navigator Documentation  Navigator Location: [facility] (11/20/15 1535)             Patient Visit Type: Inpatient (11/20/15 1535)       Visited Ms Debra Farrell 1331 to check on her well-being.  She reported feeling better as the week has progressed.   She acknowledged understanding of:  The need for continuous feed nutritional supplement, that her goal is 60 cc/hr from her current 20 cc/hr.   The need to correct current electrolyte imbalance. She reported no difficulties with XRT this week.  I will continue to follow her during this admission.  Gayleen Orem, RN, BSN, [place] [facility] [place] [phone]                            Time Spent with Patient: 15 (11/20/15 1535)

## 2015-11-20 NOTE — Progress Notes (Signed)
Nutrition Follow-up  DOCUMENTATION CODES:   Severe malnutrition in context of acute illness/injury  INTERVENTION:  - Will continue to increase: Vital 1.2 @ 20 mL/hr to increase by 10 mL every 8 hours to reach goal rate of Vital 1.2 @ 60 mL/hr. TF should be run over 18 hours/day and goal rate will provide 1296 kcal, 81 grams of protein, and 876 mL free water. - Will order 30 mL Prostat BID to add 200 kcal and 30 grams of protein. - RD will talk with pt and daughter at follow-up concerning home TF regimen and appropriate food options. - Monitor magnesium, potassium, and phosphorus daily for at least 3 days, MD to replete as needed, as pt is at risk for refeeding syndrome given Phos and Mg currently low and likely not meeting nutritional needs PTA.   NUTRITION DIAGNOSIS:   Malnutrition related to acute illness as evidenced by energy intake < or equal to 50% for > or equal to 5 days, severe depletion of body fat. 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 She is followed by Dr. Alvy Bimler and a request has been made for a [facility] for chemo and a g-tube. Of note, she does have thyroid nodules as well that were biopsied by Dr. Anselm Pancoast yesterday.  2/24 Spoke with pt and daughter, who is at bedside. Pt reports that for breakfast this AM she had 2 cups of canned peaches, 2 blueberry muffins, and an [place] ice. She states she is feeling very full this AM after eating but denies nausea. Pt currently receiving Vital 1.2 @ 10 mL/hr via PEG and denies any discomfort related to this. She is very interested in increasing TF rate. Will order Vital 1.2 @ 20 mL/hr and advance by 10 mL every 8 hours to reach goal of Vital 1.2 @ 60 mL/hr. As outlined above. Pt should be receiving TF over 18  hours/day, rather than standard 24 hours/day. Recommendation was made 2/23 to add 30 mL Prostat BID once TF tolerance had been established; will order at this time.   Vital 1.2 @ 60 mL/hr over 18 hours/day with 30 mL Prostat BID will provide 1496 kcal (71% minimal estimated kcal needs), 111 grams of protein (100% estimated protein needs), 876 mL free water.   Pt and daughter have questions concerning protein foods after d/c. Pt with many food preferences and texture aversions, in addition she does not have any teeth. Talked with them about potential foods. Daughter reports that pt eats carbohydrate-containing foods very well.   Talked with RD who has been following pt this week concerning POC, nutrition needs, and pt and daughter questions. Informed her of discussion from today as well as plan concerning TF. RD will follow-up with pt, and daughter if she is present, on Sunday (2/26).  Pt not meeting needs at this time. Medications reviewed. IVF: NS @ 75 mL/hr. Labs reviewed; Phos: 2.3 mg/dL, Mg: 1.5 mg/dL, GFR: 50.   2/23 - Spoke with pt at bedside, nurse tech in room.  - Both pt and tech reports pt ate this morning.  - For breakfast she had 2 blueberry muffins, peaches and an [place] ice (provided 505 kcal, 4g of protein).  - Very low protein breakfast.  -  Pt reports protein foods and supplements cause discomfort and nausea.  - Discussed with patient the importance of protein intake and she is agreeable to starting PEG feeds at a low rate. - Goal reflects if patient continues to consume some PO intake. - Spoke with Dr. Alvy Bimler regarding starting feeds through PEG.  - Pt has been refusing feeds until this morning. - Recommended continuous infusion of Vital AF 1.2. MD verbally consented for RD to begin TF.   Diet Order:  Diet regular Room service appropriate?: Yes; Fluid consistency:: Thin  Skin:  Wound (see comment) (Lip wound)  Last BM:  2/22  Height:   Ht Readings from Last 1  Encounters:  11/13/15 '5\' 6"'$  (1.676 m)    Weight:   Wt Readings from Last 1 Encounters:  11/19/15 145 lb (65.772 kg)    Ideal Body Weight:  59.1 kg  BMI:  Body mass index is 23.41 kg/(m^2).  Estimated Nutritional Needs:   Kcal:  2100-2300  Protein:  110-120g  Fluid:  2.1L/day  EDUCATION NEEDS:   Education needs addressed     Jarome Matin, RD, LDN Inpatient Clinical Dietitian Pager # [phone] After hours/weekend pager # [phone]

## 2015-11-20 NOTE — Progress Notes (Signed)
Placed fmla forms for dr to sign

## 2015-11-21 ENCOUNTER — Ambulatory Visit: Payer: Self-pay

## 2015-11-21 LAB — CBC WITH DIFFERENTIAL/PLATELET
BASOS ABS: 0 10*3/uL (ref 0.0–0.1)
BASOS PCT: 0 %
Eosinophils Absolute: 0 10*3/uL (ref 0.0–0.7)
Eosinophils Relative: 0 %
HEMATOCRIT: 25.2 % — AB (ref 36.0–46.0)
HEMOGLOBIN: 8.2 g/dL — AB (ref 12.0–15.0)
LYMPHS PCT: 4 %
Lymphs Abs: 0.3 10*3/uL — ABNORMAL LOW (ref 0.7–4.0)
MCH: 20.9 pg — ABNORMAL LOW (ref 26.0–34.0)
MCHC: 32.5 g/dL (ref 30.0–36.0)
MCV: 64.3 fL — AB (ref 78.0–100.0)
MONO ABS: 0.4 10*3/uL (ref 0.1–1.0)
Monocytes Relative: 4 %
NEUTROS ABS: 8.3 10*3/uL — AB (ref 1.7–7.7)
NEUTROS PCT: 93 %
Platelets: 124 10*3/uL — ABNORMAL LOW (ref 150–400)
RBC: 3.92 MIL/uL (ref 3.87–5.11)
RDW: 19 % — AB (ref 11.5–15.5)
WBC: 8.9 10*3/uL (ref 4.0–10.5)

## 2015-11-21 LAB — COMPREHENSIVE METABOLIC PANEL
ALBUMIN: 2.5 g/dL — AB (ref 3.5–5.0)
ALT: 24 U/L (ref 14–54)
AST: 25 U/L (ref 15–41)
Alkaline Phosphatase: 89 U/L (ref 38–126)
Anion gap: 8 (ref 5–15)
BILIRUBIN TOTAL: 0.8 mg/dL (ref 0.3–1.2)
BUN: 21 mg/dL — AB (ref 6–20)
CO2: 27 mmol/L (ref 22–32)
CREATININE: 1.02 mg/dL — AB (ref 0.44–1.00)
Calcium: 7.9 mg/dL — ABNORMAL LOW (ref 8.9–10.3)
Chloride: 97 mmol/L — ABNORMAL LOW (ref 101–111)
GFR calc Af Amer: >60 mL/min (ref >60)
GFR calc non Af Amer: 55 mL/min — ABNORMAL LOW (ref >60)
GLUCOSE: 158 mg/dL — AB (ref 65–99)
POTASSIUM: 3.5 mmol/L (ref 3.5–5.1)
Sodium: 132 mmol/L — ABNORMAL LOW (ref 135–145)
TOTAL PROTEIN: 5.4 g/dL — AB (ref 6.5–8.1)

## 2015-11-21 LAB — PHOSPHORUS: PHOSPHORUS: 1.6 mg/dL — AB (ref 2.5–4.6)

## 2015-11-21 LAB — MAGNESIUM: MAGNESIUM: 1.4 mg/dL — AB (ref 1.7–2.4)

## 2015-11-21 MED ORDER — POTASSIUM & SODIUM PHOSPHATES 280-160-250 MG PO PACK
1.0000 | PACK | Freq: Three times a day (TID) | ORAL | Status: AC
Start: 2015-11-21 — End: 2015-11-22
  Administered 2015-11-21 – 2015-11-22 (×5): 1
  Filled 2015-11-21 (×6): qty 1

## 2015-11-21 MED ORDER — MAGNESIUM SULFATE 2 GM/50ML IV SOLN
2.0000 g | Freq: Two times a day (BID) | INTRAVENOUS | Status: AC
  Administered 2015-11-21 (×2): 2 g via INTRAVENOUS
  Filled 2015-11-21 (×3): qty 50

## 2015-11-21 MED ORDER — POTASSIUM PHOSPHATES 15 MMOLE/5ML IV SOLN
40.0000 meq | Freq: Once | INTRAVENOUS | Status: AC
  Administered 2015-11-21: 40 meq via INTRAVENOUS
  Filled 2015-11-21: qty 9.09

## 2015-11-22 ENCOUNTER — Inpatient Hospital Stay (HOSPITAL_COMMUNITY): Payer: 59

## 2015-11-22 DIAGNOSIS — N19 Unspecified kidney failure: Secondary | ICD-10-CM | POA: Insufficient documentation

## 2015-11-22 DIAGNOSIS — D6489 Other specified anemias: Secondary | ICD-10-CM | POA: Insufficient documentation

## 2015-11-22 DIAGNOSIS — R509 Fever, unspecified: Secondary | ICD-10-CM | POA: Insufficient documentation

## 2015-11-22 LAB — COMPREHENSIVE METABOLIC PANEL
ALK PHOS: 108 U/L (ref 38–126)
ALT: 31 U/L (ref 14–54)
AST: 31 U/L (ref 15–41)
Albumin: 2.4 g/dL — ABNORMAL LOW (ref 3.5–5.0)
Anion gap: 9 (ref 5–15)
BUN: 25 mg/dL — ABNORMAL HIGH (ref 6–20)
CALCIUM: 7.9 mg/dL — AB (ref 8.9–10.3)
CO2: 26 mmol/L (ref 22–32)
CREATININE: 0.93 mg/dL (ref 0.44–1.00)
Chloride: 96 mmol/L — ABNORMAL LOW (ref 101–111)
Glucose, Bld: 144 mg/dL — ABNORMAL HIGH (ref 65–99)
Potassium: 2.9 mmol/L — ABNORMAL LOW (ref 3.5–5.1)
SODIUM: 131 mmol/L — AB (ref 135–145)
Total Bilirubin: 1.1 mg/dL (ref 0.3–1.2)
Total Protein: 5.6 g/dL — ABNORMAL LOW (ref 6.5–8.1)

## 2015-11-22 LAB — CBC WITH DIFFERENTIAL/PLATELET
Basophils Absolute: 0 10*3/uL (ref 0.0–0.1)
Basophils Relative: 0 %
EOS PCT: 0 %
Eosinophils Absolute: 0 10*3/uL (ref 0.0–0.7)
HCT: 26 % — ABNORMAL LOW (ref 36.0–46.0)
HEMOGLOBIN: 8.4 g/dL — AB (ref 12.0–15.0)
LYMPHS ABS: 0.4 10*3/uL — AB (ref 0.7–4.0)
LYMPHS PCT: 5 %
MCH: 20.5 pg — AB (ref 26.0–34.0)
MCHC: 32.3 g/dL (ref 30.0–36.0)
MCV: 63.6 fL — AB (ref 78.0–100.0)
MONO ABS: 0.4 10*3/uL (ref 0.1–1.0)
MONOS PCT: 5 %
Neutro Abs: 7.5 10*3/uL (ref 1.7–7.7)
Neutrophils Relative %: 90 %
Platelets: 131 10*3/uL — ABNORMAL LOW (ref 150–400)
RBC: 4.09 MIL/uL (ref 3.87–5.11)
RDW: 19.1 % — ABNORMAL HIGH (ref 11.5–15.5)
WBC: 8.4 10*3/uL (ref 4.0–10.5)

## 2015-11-22 LAB — PHOSPHORUS: Phosphorus: 2.3 mg/dL — ABNORMAL LOW (ref 2.5–4.6)

## 2015-11-22 LAB — MAGNESIUM: Magnesium: 2.1 mg/dL (ref 1.7–2.4)

## 2015-11-22 MED ORDER — VITAL AF 1.2 CAL PO LIQD
119.0000 mL | Freq: Four times a day (QID) | ORAL | Status: AC
  Filled 2015-11-22 (×2): qty 237

## 2015-11-22 MED ORDER — ENOXAPARIN SODIUM 40 MG/0.4ML ~~LOC~~ SOLN
40.0000 mg | SUBCUTANEOUS | Status: DC
  Administered 2015-11-22 – 2015-11-29 (×8): 40 mg via SUBCUTANEOUS
  Filled 2015-11-22 (×9): qty 0.4

## 2015-11-22 MED ORDER — POTASSIUM CHLORIDE 10 MEQ/50ML IV SOLN
10.0000 meq | INTRAVENOUS | Status: AC
  Administered 2015-11-22 (×4): 10 meq via INTRAVENOUS
  Filled 2015-11-22 (×2): qty 50

## 2015-11-22 MED ORDER — VITAL AF 1.2 CAL PO LIQD
237.0000 mL | Freq: Four times a day (QID) | ORAL | Status: DC
  Administered 2015-11-23 – 2015-11-27 (×16): 237 mL
  Filled 2015-11-22 (×29): qty 237

## 2015-11-22 MED ORDER — POTASSIUM CHLORIDE 20 MEQ/15ML (10%) PO SOLN
40.0000 meq | Freq: Two times a day (BID) | ORAL | Status: AC
  Administered 2015-11-22 – 2015-11-23 (×4): 40 meq
  Filled 2015-11-22 (×4): qty 30

## 2015-11-22 MED ORDER — FOLIC ACID 1 MG PO TABS
2.0000 mg | ORAL_TABLET | Freq: Every day | ORAL | Status: DC
  Administered 2015-11-22 – 2015-11-30 (×9): 2 mg via ORAL
  Filled 2015-11-22 (×10): qty 2

## 2015-11-22 NOTE — Progress Notes (Signed)
Debra Farrell   DOB:[birth year]   VP#:[id number]    INPATIENT PROGRESS NOTE  DOS 11/21/2015  Subjective:   Patient notes that overall she is feeling well and is keen to go home. Notes no acute new symptoms. Tube feeding rate increased to 56ml/h. No nausea issues. Daughter taking patient around the unit in a wheelchair.Still having intermittent low grade fevers . Bl cx neg.  Objective:   . Patient Vitals for the past 24 hrs:  BP Temp Temp src Pulse Resp SpO2 Weight  11/21/15 2046 (!) 158/57 mmHg 100.3 F (37.9 C) Oral (!) 106 (!) 22 90 % -  11/21/15 1400 - 99.5 F (37.5 C) Oral - - - -  11/21/15 1351 (!) 150/57 mmHg (!) 100.7 F (38.2 C) Oral 97 18 95 % -  11/21/15 1320 - (!) 100.4 F (38 C) Oral - - - -  11/21/15 0848 - (!) 100.5 F (38.1 C) Oral - - - -  11/21/15 5462 (!) 151/63 mmHg (!) 102.8 F (39.3 C) Oral (!) 103 18 90 % 147 lb (66.679 kg)     Intake/Output Summary (Last 24 hours) at 11/22/15 0017 Last data filed at 11/21/15 1835  Gross per 24 hour  Intake    120 ml  Output   1950 ml  Net  -1830 ml    GENERAL:alert, no distress and comfortable SKIN: skin color, texture, turgor are normal, no rashes or significant lesions EYES: normal, Conjunctiva are pink and non-injected, sclera clear OROPHARYNX: Oral thrush noted on the tongue and pharynx. LYMPH:  Cervical adenopathy noted LUNGS: clear to auscultation  HEART: regular rate & rhythm and no murmurs and no lower extremity edema ABDOMEN:abdomen soft, non-tender and normal bowel sounds. Feeding tube site looks okay Musculoskeletal:no cyanosis of digits and no clubbing  NEURO: alert & oriented x 3 with fluent speech, no focal motor/sensory deficits   Labs:  Marland Kitchen CBC Latest Ref Rng 11/21/2015 11/19/2015 11/18/2015  WBC 4.0 - 10.5 K/uL 8.9 10.8(H) 12.5(H)  Hemoglobin 12.0 - 15.0 g/dL 8.2(L) 9.4(L) 9.8(L)  Hematocrit 36.0 - 46.0 % 25.2(L) 28.8(L) 29.8(L)  Platelets 150 - 400 K/uL 124(L) 193 215    . CMP Latest Ref Rng  11/21/2015 11/20/2015 11/19/2015  Glucose 65 - 99 mg/dL 703(J) 009(F) 818(E)  BUN 6 - 20 mg/dL 99(B) 15 19  Creatinine 0.44 - 1.00 mg/dL 7.16(R) 6.78(L) 3.81(O)  Sodium 135 - 145 mmol/L 132(L) 132(L) 131(L)  Potassium 3.5 - 5.1 mmol/L 3.5 3.1(L) 3.2(L)  Chloride 101 - 111 mmol/L 97(L) 98(L) 97(L)  CO2 22 - 32 mmol/L 27 26 26   Calcium 8.9 - 10.3 mg/dL 7.9(L) 8.2(L) 7.8(L)  Total Protein 6.5 - 8.1 g/dL 1.7(P) 5.6(L) -  Total Bilirubin 0.3 - 1.2 mg/dL 0.8 1.0 -  Alkaline Phos 38 - 126 U/L 89 76 -  AST 15 - 41 U/L 25 20 -  ALT 14 - 54 U/L 24 19 -   . Marland Kitchen amLODipine  5 mg Per Tube BID  . antiseptic oral rinse  7 mL Mouth Rinse q12n4p  . chlorhexidine  15 mL Mouth Rinse BID  . feeding supplement (PRO-STAT SUGAR FREE 64)  30 mL Per Tube BID  . folic acid  2 mg Oral Daily  . heparin subcutaneous  5,000 Units Subcutaneous Q12H  . hydrALAZINE  10 mg Oral 3 times per day  . magnesium oxide  400 mg Oral BID  . metoCLOPramide  5 mg Oral TID AC & HS  . nystatin  5  mL Oral QID  . pantoprazole sodium  40 mg Per Tube Daily  . potassium & sodium phosphates  1 packet Per Tube TID WC & HS  . protein supplement  8 oz Oral BID WC  . scopolamine  1 patch Transdermal Q72H   .acetaminophen, gi cocktail, lidocaine-prilocaine, ondansetron (ZOFRAN) IV   Studies:  No results found.  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 Received her first cycle of cisplatin 11/11/2015 .getting daily radiation therapy . Had significant issues with her cycle of cisplatin including acute renal failure and electrolyte wasting . Plan  -Continue radiation therapy as per plan . -Dr. Bertis Ruddy will decide on adjusting additional cisplatin use vs switching to Erbitux.  Intractable nausea and vomiting- much improved at this time. Was likely multifactorial from acute gastritis, chemotherapy, radiation and anxiety. Abdominal x-ray with no evidence of bowel  obstruction. Plan -Continue scopolamine patch, Reglan PO and IV Zofran to prn -On Protonix for gastritis. -We will adjust laxatives to maintain appropriate bowel function.  Hyponatremia, improving Acute renal failure, improving. Renal ultrasound is OK, uric acid OK Due to dehydration and recent cisplatin Plan -Continue IVF resuscitation -Monitor daily   Moderate protein calorie malnutriton Poor oral intake, improving Plan  -advancing continuous tube feeding as per dietitian input. -Additionally can have a regular diet orally.  Essential Hypertension On amlodipine, added hydralazine -Diuretics on hold due to dehydration and poor oral intake .  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 -added folic acid to support accelerated hematopoiesis with thal minor  DVT prophylaxis -On subcutaneous heparin due to renal insufficiency will switch to Lovenox if renal function remained stable/improved tomorrow .  Fall precaution -Encourage ambulation Patient declined PT assessment  Febrile episode low-grade fever today as well .concern for oral thrush on examination .blood cultures prelim neg Negative UA . Could be from dehydration/low-level mucositis from radiation therapy. Plan -continue with nystatin 4 times a day for 14 days for presumed oral thrush. -Follow-up blood cultures final results. No other focal symptoms.  Debra Lora, MD

## 2015-11-22 NOTE — Progress Notes (Addendum)
Nutrition Follow-up  DOCUMENTATION CODES:   Severe malnutrition in context of acute illness/injury  INTERVENTION:   Recommend continue to monitor for refeeding syndrome.   ADDENDUM: Continue to provide continuous infusion of Vital AF 1.2 for 18 hours at goal.  2/26: At 1700 stop TF.  2/27: Transition to bolus feeds of Vital AF 1.2 @ 0700. 1/2 can for first 2 feedings (0700, 1100). Advance to 1 can at feedings (1500, 1900).  Bolus feeds schedule:  1 can Vital AF 1.2 every 4 hours (0700, 1100, 1500, 1900) Recommend water flushes of 30 ml before and after each bolus. 30 ml Prostat TID Tube feeding regimen to provide 1436 kcal (68% of needs), 117g protein (100% of needs) and 1008 ml H2O.  If patient is not receiving IVF, recommend free water flushes of 240 ml every 6 hours.  D/c Unjury -pt not consuming these  RD to continue to monitor  NUTRITION DIAGNOSIS:   Malnutrition related to acute illness as evidenced by energy intake < or equal to 50% for > or equal to 5 days, severe depletion of body fat.  Ongoing.  GOAL:   Patient will meet greater than or equal to 90% of their needs  Meeting.  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 She is followed by Dr. Alvy Bimler and a request has been made for a [facility] for chemo and a g-tube. Of note, she does have thyroid nodules as well that were biopsied by Dr. Anselm Pancoast yesterday.  Patient in room with RN and Dr. Irene Limbo at bedside. RN reports pt has tolerated TF at goal with no residuals or nausea. Pt questions whether she is to continue continuous feeds or transition to bolus. Pt was encouraged to try bolus feeds here at the hospital. Will stop TF once she has received goal rate for 18 hours. RN states she has been at 60 ml/hr since 2300 yesterday. Will stop TF at 1700 today.  Questionable if pt will be discharged  tomorrow given electrolytes and refeeding per oncologist. Pt would like a list of protein options but when told she can take in protein PO she did not seem interested. Pt will most likely need to rely on TF and protein modulars to meet protein needs at this time. Will follow-up with patient and hopefully daughter tomorrow to review other options. Pt is still encouraged to eat between boluses to take in other 25% of kcal needs.  Medications: folic acid tablet daily, Mag-OX tablet BID, Reglan QID, KCl BID  Labs reviewed: Low Na, K, Phos Mg WNL today  Diet Order:  Diet regular Room service appropriate?: Yes; Fluid consistency:: Thin  Skin:  Wound (see comment) (Lip wound)  Last BM:  2/22  Height:   Ht Readings from Last 1 Encounters:  11/13/15 '5\' 6"'$  (1.676 m)    Weight:   Wt Readings from Last 1 Encounters:  11/22/15 158 lb (71.668 kg)    Ideal Body Weight:  59.1 kg  BMI:  Body mass index is 25.51 kg/(m^2).  Estimated Nutritional Needs:   Kcal:  2100-2300  Protein:  110-120g  Fluid:  2.1L/day  EDUCATION NEEDS:   Education needs addressed  Clayton Bibles, MS, RD, LDN Pager: [phone] After Hours Pager: [phone]

## 2015-11-22 NOTE — Progress Notes (Signed)
Debra Farrell   DOB:[birth year]   WN#:[id number]    INPATIENT PROGRESS NOTE  DOS 11/22/2015  Subjective:   Patient notes that she is feeling well and has no acute new concerns. She is keen to go home soon. Potassium back down again to 2.9. Phosphorus levels and magnesium levels improving. Dietitian was at bedside when I saw the patient. Patient is at her continuous feeding goal of 60 mL per hour 18 hours a day and has had no issues with nausea or vomiting. Limited oral intake at this time. Had a good bowel movement. Had a fever again this morning. No new focal symptoms. No redness or discharge around her feeding tube.   Objective:   . Patient Vitals for the past 24 hrs:  BP Temp Temp src Pulse Resp SpO2 Weight  11/22/15 1306 (!) 164/61 mmHg 98.9 F (37.2 C) Oral 87 20 93 % -  11/22/15 0600 (!) 142/53 mmHg (!) 103 F (39.4 C) Oral (!) 106 (!) 22 91 % 158 lb (71.668 kg)  11/21/15 2046 (!) 158/57 mmHg 100.3 F (37.9 C) Oral (!) 106 (!) 22 90 % -     Intake/Output Summary (Last 24 hours) at 11/22/15 1546 Last data filed at 11/22/15 0602  Gross per 24 hour  Intake      0 ml  Output    800 ml  Net   -800 ml    GENERAL:alert, no distress and comfortable SKIN: skin color, texture, turgor are normal, no rashes or significant lesions EYES: normal, Conjunctiva are pink and non-injected, sclera clear OROPHARYNX: Oral thrush noted on the tongue and pharynx. LYMPH:  Cervical adenopathy noted LUNGS: clear to auscultation  HEART: regular rate & rhythm and no murmurs and no lower extremity edema ABDOMEN:abdomen soft, non-tender and normal bowel sounds. Feeding tube site looks okay Musculoskeletal:no cyanosis of digits and no clubbing  NEURO: alert & oriented x 3 with fluent speech, no focal motor/sensory deficits   Labs:  Marland Kitchen CBC Latest Ref Rng 11/22/2015 11/21/2015 11/19/2015  WBC 4.0 - 10.5 K/uL 8.4 8.9 10.8(H)  Hemoglobin 12.0 - 15.0 g/dL 4.0(H) 4.7(Q) 2.5(Z)  Hematocrit 36.0 - 46.0 %  26.0(L) 25.2(L) 28.8(L)  Platelets 150 - 400 K/uL 131(L) 124(L) 193    . CMP Latest Ref Rng 11/22/2015 11/21/2015 11/20/2015  Glucose 65 - 99 mg/dL 563(O) 756(E) 332(R)  BUN 6 - 20 mg/dL 51(O) 84(Z) 15  Creatinine 0.44 - 1.00 mg/dL 6.60 6.30(Z) 6.01(U)  Sodium 135 - 145 mmol/L 131(L) 132(L) 132(L)  Potassium 3.5 - 5.1 mmol/L 2.9(L) 3.5 3.1(L)  Chloride 101 - 111 mmol/L 96(L) 97(L) 98(L)  CO2 22 - 32 mmol/L 26 27 26   Calcium 8.9 - 10.3 mg/dL 7.9(L) 7.9(L) 8.2(L)  Total Protein 6.5 - 8.1 g/dL 9.3(A) 3.5(T) 5.6(L)  Total Bilirubin 0.3 - 1.2 mg/dL 1.1 0.8 1.0  Alkaline Phos 38 - 126 U/L 108 89 76  AST 15 - 41 U/L 31 25 20   ALT 14 - 54 U/L 31 24 19    . . amLODipine  5 mg Per Tube BID  . antiseptic oral rinse  7 mL Mouth Rinse q12n4p  . chlorhexidine  15 mL Mouth Rinse BID  . feeding supplement (PRO-STAT SUGAR FREE 64)  30 mL Per Tube BID  . [START ON 11/23/2015] feeding supplement (VITAL AF 1.2 CAL)  119 mL Per Tube QID  . [START ON 11/23/2015] feeding supplement (VITAL AF 1.2 CAL)  237 mL Per Tube QID  . folic acid  2 mg  Oral Daily  . heparin subcutaneous  5,000 Units Subcutaneous Q12H  . hydrALAZINE  10 mg Oral 3 times per day  . magnesium oxide  400 mg Oral BID  . metoCLOPramide  5 mg Oral TID AC & HS  . nystatin  5 mL Oral QID  . pantoprazole sodium  40 mg Per Tube Daily  . potassium & sodium phosphates  1 packet Per Tube TID WC & HS  . potassium chloride  10 mEq Intravenous Q1 Hr x 4  . potassium chloride  40 mEq Per Tube BID  . scopolamine  1 patch Transdermal Q72H   .acetaminophen, gi cocktail, lidocaine-prilocaine, ondansetron (ZOFRAN) IV   Studies:  No results found.  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 Received her first cycle of cisplatin 11/11/2015 .getting daily radiation therapy . Had significant issues with her cycle of cisplatin including acute renal failure and electrolyte wasting . Plan   -Continue radiation therapy as per plan . -Dr. Bertis Ruddy will decide on adjusting additional cisplatin use vs switching to Erbitux.  Intractable nausea and vomiting- This has resolved. Was likely multifactorial from acute gastritis, chemotherapy, radiation and anxiety. Abdominal x-ray with no evidence of bowel obstruction. Patient is tolerating her target tube feeding rate of 60 mL per hour for 18 hours daily without significant nausea. Had a good bowel movement today Plan -Continue scopolamine patch, Reglan PO and IV Zofran to prn -On Protonix for gastritis. -Dietitian will try to transition to bolus feeding so that the patient does not continuously connected to the feeding pump. Patient is agreeable with this idea. She'll continue try to eat as well she can orally.  Hyponatremia, improving Acute renal failure, improving. Renal ultrasound is OK, uric acid OK Due to dehydration and recent cisplatin Plan -Continue IVF resuscitation -Monitor daily -Replacing electrolytes aggressively   Moderate protein calorie malnutriton Poor oral intake, improving Plan  -Has reached target rate with continuous tube feeding as per dietitian. We'll attempt to switch to bolus feeding see if she tolerates this. -Additionally can have a regular diet orally.  Essential Hypertension On amlodipine, added hydralazine -Diuretics on hold due to dehydration and poor oral intake .  Electrolyte imbalance with hypomagnesemia and hypokalemia and hypophosphatemia likely due to renal wasting in the setting of cisplatin use and likely refeeding syndrome. Magnesium better. Still with significant hypokalemia potassium 2.9. Plan -continue aggressive replacement -Check labs daily  Thalassemia Minor, acute on chronic anemia Received blood transfusion on 11/15/15 -Transfuse when necessary for hemoglobin less than 8. -Continue folic acid   DVT prophylaxis -On subcutaneous heparin due to renal insufficiency since this  has resolved we shall switch to Lovenox from this evening.  Fall precaution -Encourage ambulation   Recurrent Febrile episode continuing to have fevers today as well .concern for oral thrush on examination .blood cultures negative. Negative UA . Could be from dehydration/low-level mucositis from radiation therapy. No evidence of redness or leakage around her feeding tube. No abdominal pain. Plan -continue with nystatin 4 times a day for 14 days for presumed oral thrush. -The blood cultures x 2 from central line and peripheral line. . -Patient is not neutropenic at this time and will not start empiric antibiotics . Would consider starting empiric antibiotics if the patient has any signs of hemodynamic instability or persistent high-grade fever spikes. -We'll get chest x-ray to complete septic workup .   [name], MD

## 2015-11-23 ENCOUNTER — Encounter: Payer: Self-pay | Admitting: Hematology and Oncology

## 2015-11-23 ENCOUNTER — Encounter (HOSPITAL_COMMUNITY): Payer: Self-pay

## 2015-11-23 ENCOUNTER — Ambulatory Visit
Admission: RE | Admit: 2015-11-23 | Discharge: 2015-11-23 | Disposition: A | Discharge status: Home / Self Care | Payer: 59 | Source: Ambulatory Visit | Attending: Radiation Oncology | Admitting: Radiation Oncology

## 2015-11-23 ENCOUNTER — Inpatient Hospital Stay (HOSPITAL_COMMUNITY): Payer: 59

## 2015-11-23 ENCOUNTER — Other Ambulatory Visit: Payer: Self-pay

## 2015-11-23 ENCOUNTER — Ambulatory Visit: Payer: 59

## 2015-11-23 ENCOUNTER — Encounter: Payer: Self-pay | Admitting: Radiation Oncology

## 2015-11-23 ENCOUNTER — Encounter: Payer: Self-pay | Admitting: Nutrition

## 2015-11-23 VITALS — BP 138/73 | HR 94 | Temp 99.5°F | Ht 66.0 in | Wt 160.0 lb

## 2015-11-23 DIAGNOSIS — C01 Malignant neoplasm of base of tongue: Secondary | ICD-10-CM

## 2015-11-23 DIAGNOSIS — R5081 Fever presenting with conditions classified elsewhere: Secondary | ICD-10-CM

## 2015-11-23 LAB — CBC WITH DIFFERENTIAL/PLATELET
BASOS PCT: 1 %
Basophils Absolute: 0 10*3/uL (ref 0.0–0.1)
EOS ABS: 0 10*3/uL (ref 0.0–0.7)
Eosinophils Relative: 0 %
HEMATOCRIT: 37.4 % (ref 36.0–46.0)
HEMOGLOBIN: 12.3 g/dL (ref 12.0–15.0)
LYMPHS ABS: 0.2 10*3/uL — AB (ref 0.7–4.0)
Lymphocytes Relative: 9 %
MCH: 21 pg — AB (ref 26.0–34.0)
MCHC: 32.9 g/dL (ref 30.0–36.0)
MCV: 63.8 fL — AB (ref 78.0–100.0)
MONO ABS: 0.3 10*3/uL (ref 0.1–1.0)
Monocytes Relative: 14 %
NEUTROS ABS: 1.9 10*3/uL (ref 1.7–7.7)
Neutrophils Relative %: 76 %
Platelets: 91 10*3/uL — ABNORMAL LOW (ref 150–400)
RBC: 5.86 MIL/uL — ABNORMAL HIGH (ref 3.87–5.11)
RDW: 20.3 % — ABNORMAL HIGH (ref 11.5–15.5)
WBC: 2.4 10*3/uL — ABNORMAL LOW (ref 4.0–10.5)

## 2015-11-23 LAB — COMPREHENSIVE METABOLIC PANEL
ALK PHOS: 91 U/L (ref 38–126)
ALT: 30 U/L (ref 14–54)
ANION GAP: 9 (ref 5–15)
AST: 34 U/L (ref 15–41)
Albumin: 2.3 g/dL — ABNORMAL LOW (ref 3.5–5.0)
BILIRUBIN TOTAL: 1.4 mg/dL — AB (ref 0.3–1.2)
BUN: 19 mg/dL (ref 6–20)
CALCIUM: 8.4 mg/dL — AB (ref 8.9–10.3)
CO2: 26 mmol/L (ref 22–32)
CREATININE: 0.9 mg/dL (ref 0.44–1.00)
Chloride: 101 mmol/L (ref 101–111)
Glucose, Bld: 99 mg/dL (ref 65–99)
Potassium: 4.1 mmol/L (ref 3.5–5.1)
Sodium: 136 mmol/L (ref 135–145)
TOTAL PROTEIN: 5.8 g/dL — AB (ref 6.5–8.1)

## 2015-11-23 LAB — MAGNESIUM: MAGNESIUM: 1.7 mg/dL (ref 1.7–2.4)

## 2015-11-23 LAB — PHOSPHORUS: PHOSPHORUS: 2.7 mg/dL (ref 2.5–4.6)

## 2015-11-23 MED ORDER — IOHEXOL 300 MG/ML  SOLN
10.0000 mL | Freq: Once | INTRAMUSCULAR | Status: AC | PRN
  Administered 2015-11-23: 10 mL

## 2015-11-23 MED ORDER — LEVOFLOXACIN IN D5W 750 MG/150ML IV SOLN
750.0000 mg | INTRAVENOUS | Status: DC
  Administered 2015-11-23 – 2015-11-24 (×2): 750 mg via INTRAVENOUS
  Filled 2015-11-23: qty 150

## 2015-11-23 NOTE — Progress Notes (Signed)
Debra Farrell   DOB:[birth year]   ZO#:[id number]    Subjective: The patient is seen this morning with her daughter present. Significant leak around the PEG tube is noted. Tube feeds was held since last night. She continues to have intermittent fever. No further nausea or vomiting. The patient was able to eat oral diet. She denies any cough, chest pain or shortness of breath  O bjective:  Filed Vitals:   11/22/15 2200 11/23/15 0622  BP:  146/64  Pulse:  92  Temp: 99.4 F (37.4 C) 99.7 F (37.6 C)  Resp:  18     Intake/Output Summary (Last 24 hours) at 11/23/15 1142 Last data filed at 11/22/15 2043  Gross per 24 hour  Intake    355 ml  Output      0 ml  Net    355 ml    GENERAL:alert, no distress and comfortable SKIN: skin color, texture, turgor are normal, no rashes or significant lesions EYES: normal, Conjunctiva are pink and non-injected, sclera clear OROPHARYNX:no exudate, no erythema and lips, buccal mucosa, and tongue normal  NECK: supple, thyroid normal size, non-tender, without nodularity LYMPH:  Persistent lymphadenopathy is seen NGS: clear to auscultation and percussion with normal breathing effort HEART: regular rate & rhythm and no murmurs and no lower extremity edema ABDOMEN:abdomen soft, non-tender and normal bowel sounds. There is significant leak around the feeding tube with malodorous smell  Musculoskeletal:no cyanosis of digits and no clubbing  NEURO: alert & oriented x 3 with fluent speech, no focal motor/sensory deficits   Labs:  Lab Results  Component Value Date   WBC 2.4* 11/23/2015   HGB 12.3 11/23/2015   HCT 37.4 11/23/2015   MCV 63.8* 11/23/2015   PLT 91* 11/23/2015   NEUTROABS 1.9 11/23/2015    Lab Results  Component Value Date   NA 136 11/23/2015   K 4.1 11/23/2015   CL 101 11/23/2015   CO2 26 11/23/2015    Studies:  Dg Chest 2 View  11/22/2015  CLINICAL DATA:  Dry cough for 2 days EXAM: CHEST  2 VIEW COMPARISON:  10/14/2015 FINDINGS:  Cardiac shadow is within normal limits. Right chest wall port is noted in satisfactory position. Bibasilar infiltrative changes are noted right greater than left. Small effusions are seen as well. No bony abnormality is noted. IMPRESSION: Bibasilar infiltrates with small effusions. Electronically Signed   By: Alcide Clever M.D.   On: 11/22/2015 17:26    Assessment & Plan:   Cancer of base of tongue (HCC) Unfortunately, she developed intractable nausea and vomiting, multifactorial, could be related to side effects of chemotherapy, compounded by severe gastritis and recent constipation Continue supportive care OK to continue radiation She may not be able to tolerate further chemotherapy  Intractable nausea and vomiting, improving She is clinically dehydrated. She received IV dexamethasone for 2 days on 2/16 & 2/17 Continue scopolamine patch, switching Reglan to PO and IV Zofran to prn Abdominal X ray is within normal limits  Gastritis, acute She has severe acute gastritis, exacerbating her nausea and vomiting. Continue protonix She did not tolerate Carafate. We switched her to GI cocktail  Hyponatremia, improving Acute renal failure, resolved Due to dehydration and recent cisplatin Continue IVF resuscitation Monitor daily Renal ultrasound is OK, uric acid OK  Moderate protein calorie malnutriton Poor oral intake, improving Consult nutritionist Advancing to regular diet  Leak around feeding tube We'll consult IR  Essential Hypertension Was on HCTZ at home/baseline. Due to dehydration, this is stopped  Started on amlodipine, added hydralazine  Pain at surgical incision, resolved Her pain has improved with IV Dilaudid. Discontinue pain medicine due to recent falls  Electrolyte imbalance with hypomagnesemia and hypokalemia, resolved Replacement started, improving Monitor daily  Thalassemia, acute on chronic anemia Received blood transfusion on 11/15/15  DVT  prophylaxis Switching to heparin subcutaneous due to worsening renal failure  Fall precaution Discussed with charge nurse I have continued lorazepam and Dilaudid Encourage ambulation Patient declined PT assessment  Febrile episode Possible lung infiltrate The patient is not neutropenic I will start her on levofloxacin  Discharge planning  Not ready for discharge due to unresolved fever and leak of feeding tube  [facility], Ebin Palazzi, MD 11/23/2015  11:42 AM

## 2015-11-23 NOTE — Progress Notes (Signed)
   Weekly Management Note:  Outpatient    ICD-9-CM ICD-10-CM   1. Cancer of base of tongue (HCC) 141.0 C01         Current Dose:  16 Gy  Projected Dose: 70 Gy   Narrative:  The patient presents for routine under treatment assessment.  CBCT/MVCT images/Port film x-rays were reviewed.  The chart was checked. Still coping with nausea.  Inpatient.  Leaking PEG tube to be fixed, soon  Physical Findings:  Wt Readings from Last 3 Encounters:  11/23/15 160 lb (72.576 kg)  11/23/15 160 lb (72.576 kg)  11/17/15 157 lb 1.6 oz (71.26 kg)    height is '5\' 6"'$  (1.676 m) and weight is 160 lb (72.576 kg). Her oral temperature is 99.7 F (37.6 C). Her blood pressure is 146/64 and her pulse is 92. Her respiration is 18 and oxygen saturation is 98%.  No mucositis or skin irritation thus far.  CBC    Component Value Date/Time   WBC 2.4* 11/23/2015 0622   RBC 5.86* 11/23/2015 0622   RBC 3.97 11/15/2015 0700   HGB 12.3 11/23/2015 0622   HCT 37.4 11/23/2015 0622   PLT 91* 11/23/2015 0622   MCV 63.8* 11/23/2015 0622   MCH 21.0* 11/23/2015 0622   MCHC 32.9 11/23/2015 0622   RDW 20.3* 11/23/2015 0622   LYMPHSABS 0.2* 11/23/2015 0622   MONOABS 0.3 11/23/2015 0622   EOSABS 0.0 11/23/2015 0622   BASOSABS 0.0 11/23/2015 0622     CMP     Component Value Date/Time   NA 136 11/23/2015 0622   K 4.1 11/23/2015 0622   CL 101 11/23/2015 0622   CO2 26 11/23/2015 0622   GLUCOSE 99 11/23/2015 0622   BUN 19 11/23/2015 0622   CREATININE 0.90 11/23/2015 0622   CREATININE 1.04 04/07/2015 1536   CALCIUM 8.4* 11/23/2015 0622   PROT 5.8* 11/23/2015 0622   ALBUMIN 2.3* 11/23/2015 0622   AST 34 11/23/2015 0622   ALT 30 11/23/2015 0622   ALKPHOS 91 11/23/2015 0622   BILITOT 1.4* 11/23/2015 0622   GFRNONAA >60 11/23/2015 0622   GFRAA >60 11/23/2015 0622     Impression:  The patient is tolerating radiotherapy.   Plan:  Continue radiotherapy as planned.  Provided verbal support and empathetic  listening.  -----------------------------------  [name], MD

## 2015-11-23 NOTE — Progress Notes (Signed)
Nutrition Follow-up  DOCUMENTATION CODES:   Severe malnutrition in context of acute illness/injury  INTERVENTION:   Recommend continue to monitor for refeeding syndrome.   Once PEG is able to be used: Initiate bolus feeds of Vital AF 1.2 @ 0700. 1/2 can for first 2 feedings. If tolerating, advance to 1 can at feedings.  Bolus feeds schedule:  1 can Vital AF 1.2 every 4 hours (0700, 1100, 1500, 1900) Recommend water flushes of 30 ml before and after each bolus. 30 ml Prostat TID Tube feeding regimen to provide 1436 kcal (68% of needs), 117g protein (100% of needs) and 1008 ml H2O.  If patient is not receiving IVF, recommend free water flushes of 240 ml every 6 hours.  RD to continue to monitor   NUTRITION DIAGNOSIS:   Malnutrition related to acute illness as evidenced by energy intake < or equal to 50% for > or equal to 5 days, severe depletion of body fat.  Ongoing.  GOAL:   Patient will meet greater than or equal to 90% of their needs  Not meeting.  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 She is followed by Dr. Alvy Bimler and a request has been made for a [facility] for chemo and a g-tube. Of note, she does have thyroid nodules as well that were biopsied by Dr. Anselm Pancoast yesterday.  Pt's PEG was suspected of leaking today, IR checked and PEG is no longer leaking. Spoke with pt who is eager to begin bolus feeds. RN was going to check to see if feeds could be resumed. TF recommendations remain the same.  RD to follow for tolerance and advancement.  Medications: folic acid tablet daily, Mag-OX tablet BID, Reglan QID, KCl BID  Labs reviewed: Mg/Phos/K WNL  Diet Order:  Diet regular Room service appropriate?: Yes; Fluid consistency:: Thin  Skin:  Wound (see comment) (Lip wound)  Last BM:  2/22  Height:   Ht Readings from Last 1 Encounters:   11/13/15 '5\' 6"'$  (1.676 m)    Weight:   Wt Readings from Last 1 Encounters:  11/23/15 160 lb (72.576 kg)    Ideal Body Weight:  59.1 kg  BMI:  Body mass index is 25.84 kg/(m^2).  Estimated Nutritional Needs:   Kcal:  2100-2300  Protein:  110-120g  Fluid:  2.1L/day  EDUCATION NEEDS:   Education needs addressed  Clayton Bibles, MS, RD, LDN Pager: [phone] After Hours Pager: [phone]

## 2015-11-23 NOTE — Progress Notes (Signed)
faxed  [phone] and left copy for patient at front with ms wilma

## 2015-11-23 NOTE — Progress Notes (Signed)
[place] [facility] Therapy Treatment Record Phone [phone]   Radiation Therapy was administered to Debra Farrell on: 11/23/2015  9:12 AM and was treatment # 8 out of a planned course of 35 treatments.

## 2015-11-23 NOTE — Progress Notes (Signed)
Pt peg site has been leaking since yesterday. Site dressing was changed twice this shift. Pt is suppose to get bolus feed this a.m but wants to hold until later this morning.

## 2015-11-23 NOTE — Progress Notes (Signed)
Debra Farrell is here for her 8th fraction of radiation to her Base of Tongue and Bilateral Neck. She remains an inpatient. She has been receiving continuous tube feeding until today because of improved nausea. She was to start slowly receiving bolus doses today, however her PEG tube began to leak, and she will go to interventional Radiology to have it evaluated today. The skin to her neck is normal without any pain or tenderness. She is not using the sonafine cream because it is at home. I have encouraged her to have somebody bring it from home so she can use it. She is walking around the unit to maintain her energy level while in the hospital.   BP 138/73 mmHg  Pulse 94  Temp(Src) 99.5 F (37.5 C)  Ht '5\' 6"'$  (1.676 m)  Wt 160 lb (72.576 kg)  BMI 25.84 kg/m2   Wt Readings from Last 3 Encounters:  11/23/15 160 lb (72.576 kg)  11/23/15 160 lb (72.576 kg)  11/17/15 157 lb 1.6 oz (71.26 kg)

## 2015-11-23 NOTE — Progress Notes (Signed)
Asked to eval G-tube for possible leak. G-tube placed 2/9 without complication. She has used it well without issue until yesterday./ They noticed copious amounts of foul smelling purulent drainage. She reported some pain at the site as well, which is now improved coincidentally. It does not appear to leak TF or when flushed.  BP 146/64 mmHg  Pulse 92  Temp(Src) 99.7 F (37.6 C) (Oral)  Resp 18  Ht '5\' 6"'$  (1.676 m)  Wt 160 lb (72.576 kg)  BMI 25.84 kg/m2  SpO2 98% G-tube site is actually very clean, no apparent laxity in tube or around tract. No erythema or fluctuance. Tube flushed easily with about 30cc water, no signs of leak.  Drainage from G-tube site. Do not suspect leak or malpositioning of tube but to be thorough, will bring down for quick injection under fluoro. Could have been an abscess/fluid pocket along tract that has spontaneously drained Pt now on abx.  Ascencion Dike PA-C Interventional Radiology 11/23/2015 11:21 AM    ADDENDUM: G-tube injection shows properly positioned G-tube with no evidence of leak. Recommend diligent dressing changes prn soilage. Drainage appears to have slowed. If continued copious drainage and/or fever/leukocytosis, may need abdominal CT, thought there is nothing suspicious on clinical exam to suggest abscess at g-tube site.  Ascencion Dike PA-C Interventional Radiology 11/23/2015 1:02 PM

## 2015-11-23 NOTE — Consult Note (Signed)
WOC wound consult note Reason for Consult:Leaking around PEG tube, improved per patient.  Feeds have been held.  Wound type:Moisture Associated Skin Damage (MASD) from leaking PEG tube.  PEG tube fits snugly against skin as well.  Pressure Ulcer POA: N/A Measurement: Erythema noted circumferentially around PEG tube site.  No breakdown.  Dry drain dressing is in place.  Musty odor is present.  Patient states this is improved.  SKin is intact.  Wound BXI:DHWYSHUO Drainage (amount, consistency, odor) Leaking from PEG tube site.  Brown liquid noted to PEG tube drain sponge with musty odor.  Periwound:erythema Dressing procedure/placement/frequency:Cleanse PEG tube site with soap and water and pat gently dry.  Apply dry sponge twice daily and PRN soilage. Barrier cream is leaking resumes.  Will not follow at this time.  Please re-consult if needed.  Domenic Moras RN BSN [place] Pager [phone]

## 2015-11-24 ENCOUNTER — Ambulatory Visit: Payer: 59

## 2015-11-24 ENCOUNTER — Ambulatory Visit
Admission: RE | Admit: 2015-11-24 | Discharge: 2015-11-24 | Disposition: A | Discharge status: Home / Self Care | Payer: 59 | Source: Ambulatory Visit | Attending: Radiation Oncology | Admitting: Radiation Oncology

## 2015-11-24 ENCOUNTER — Ambulatory Visit: Payer: Self-pay

## 2015-11-24 ENCOUNTER — Inpatient Hospital Stay (HOSPITAL_COMMUNITY): Payer: 59

## 2015-11-24 DIAGNOSIS — D703 Neutropenia due to infection: Secondary | ICD-10-CM

## 2015-11-24 LAB — CBC WITH DIFFERENTIAL/PLATELET
BASOS ABS: 0 10*3/uL (ref 0.0–0.1)
BASOS PCT: 3 %
Basophils Absolute: 0.1 10*3/uL (ref 0.0–0.1)
Basophils Relative: 2 %
EOS ABS: 0 10*3/uL (ref 0.0–0.7)
Eosinophils Absolute: 0 10*3/uL (ref 0.0–0.7)
Eosinophils Relative: 1 %
Eosinophils Relative: 1 %
HCT: 23.9 % — ABNORMAL LOW (ref 36.0–46.0)
HEMATOCRIT: 22.7 % — AB (ref 36.0–46.0)
Hemoglobin: 7.7 g/dL — ABNORMAL LOW (ref 12.0–15.0)
Hemoglobin: 7.3 g/dL — ABNORMAL LOW (ref 12.0–15.0)
LYMPHS ABS: 0.9 10*3/uL (ref 0.7–4.0)
LYMPHS ABS: 0.6 10*3/uL — AB (ref 0.7–4.0)
Lymphocytes Relative: 33 %
Lymphocytes Relative: 32 %
MCH: 20.9 pg — AB (ref 26.0–34.0)
MCH: 20.7 pg — ABNORMAL LOW (ref 26.0–34.0)
MCHC: 32.2 g/dL (ref 30.0–36.0)
MCHC: 32.2 g/dL (ref 30.0–36.0)
MCV: 64.8 fL — AB (ref 78.0–100.0)
MCV: 64.5 fL — AB (ref 78.0–100.0)
MONO ABS: 0.6 10*3/uL (ref 0.1–1.0)
MONO ABS: 0.6 10*3/uL (ref 0.1–1.0)
MONOS PCT: 23 %
Monocytes Relative: 28 %
NEUTROS ABS: 0.8 10*3/uL — AB (ref 1.7–7.7)
NEUTROS PCT: 41 %
Neutro Abs: 1.1 10*3/uL — ABNORMAL LOW (ref 1.7–7.7)
Neutrophils Relative %: 37 %
PLATELETS: 204 10*3/uL (ref 150–400)
PLATELETS: 204 10*3/uL (ref 150–400)
RBC: 3.69 MIL/uL — ABNORMAL LOW (ref 3.87–5.11)
RBC: 3.52 MIL/uL — AB (ref 3.87–5.11)
RDW: 19.8 % — ABNORMAL HIGH (ref 11.5–15.5)
RDW: 19.8 % — AB (ref 11.5–15.5)
WBC: 2.8 10*3/uL — ABNORMAL LOW (ref 4.0–10.5)
WBC: 2 10*3/uL — AB (ref 4.0–10.5)

## 2015-11-24 LAB — CULTURE, BLOOD (ROUTINE X 2)
CULTURE: NO GROWTH
Culture: NO GROWTH

## 2015-11-24 LAB — URINALYSIS, ROUTINE W REFLEX MICROSCOPIC
BILIRUBIN URINE: NEGATIVE
GLUCOSE, UA: NEGATIVE mg/dL
Hgb urine dipstick: NEGATIVE
KETONES UR: NEGATIVE mg/dL
LEUKOCYTES UA: NEGATIVE
NITRITE: NEGATIVE
PH: 7 (ref 5.0–8.0)
PROTEIN: NEGATIVE mg/dL
Specific Gravity, Urine: 1.011 (ref 1.005–1.030)

## 2015-11-24 LAB — COMPREHENSIVE METABOLIC PANEL
ALK PHOS: 88 U/L (ref 38–126)
ALT: 31 U/L (ref 14–54)
ANION GAP: 9 (ref 5–15)
AST: 33 U/L (ref 15–41)
Albumin: 2.3 g/dL — ABNORMAL LOW (ref 3.5–5.0)
BUN: 20 mg/dL (ref 6–20)
CALCIUM: 8.4 mg/dL — AB (ref 8.9–10.3)
CHLORIDE: 101 mmol/L (ref 101–111)
CO2: 25 mmol/L (ref 22–32)
Creatinine, Ser: 0.88 mg/dL (ref 0.44–1.00)
Glucose, Bld: 104 mg/dL — ABNORMAL HIGH (ref 65–99)
Potassium: 3.6 mmol/L (ref 3.5–5.1)
SODIUM: 135 mmol/L (ref 135–145)
TOTAL PROTEIN: 6 g/dL — AB (ref 6.5–8.1)
Total Bilirubin: 1.4 mg/dL — ABNORMAL HIGH (ref 0.3–1.2)

## 2015-11-24 MED ORDER — IOHEXOL 300 MG/ML  SOLN
100.0000 mL | Freq: Once | INTRAMUSCULAR | Status: AC | PRN
  Administered 2015-11-24: 100 mL via INTRAVENOUS

## 2015-11-24 MED ORDER — IOHEXOL 300 MG/ML  SOLN
50.0000 mL | Freq: Once | INTRAMUSCULAR | Status: AC | PRN
  Administered 2015-11-24: 50 mL via ORAL

## 2015-11-24 MED ORDER — DEXTROSE 5 % IV SOLN
2.0000 g | Freq: Three times a day (TID) | INTRAVENOUS | Status: DC
  Administered 2015-11-25 – 2015-11-29 (×14): 2 g via INTRAVENOUS
  Filled 2015-11-24 (×19): qty 2

## 2015-11-24 MED ORDER — PRO-STAT SUGAR FREE PO LIQD
30.0000 mL | Freq: Three times a day (TID) | ORAL | Status: DC
  Administered 2015-11-24 – 2015-11-26 (×5): 30 mL
  Administered 2015-11-26: 11:00:00
  Administered 2015-11-26 – 2015-11-30 (×11): 30 mL
  Filled 2015-11-24 (×19): qty 30

## 2015-11-24 MED ORDER — DEXTROSE 5 % IV SOLN
2.0000 g | INTRAVENOUS | Status: AC
  Administered 2015-11-24: 2 g via INTRAVENOUS
  Filled 2015-11-24: qty 2

## 2015-11-24 NOTE — Progress Notes (Signed)
Patient continues to have leaking around PEG site.  On night shift, dressing has been changed 3 times since 8pm.  Drainage is brown and purulent smelling. Will continue to cleanse/dry site and change dressing PRN.Azzie Glatter [place]

## 2015-11-24 NOTE — Progress Notes (Signed)
Pharmacy Antibiotic Note  Debra Farrell is a 70 y.o. female admitted on 11/13/2015 with febrile neutropenia.  Pharmacy has been consulted for Cefepime dosing.  Plan: Cefepime 2gm IV q8h  Height: '5\' 6"'$  (167.6 cm) Weight: 163 lb (73.936 kg) IBW/kg (Calculated) : 59.3  Temp (24hrs), Avg:100.2 F (37.9 C), Min:98.4 F (36.9 C), Max:102.2 F (39 C)   Recent Labs Lab 11/19/15 0600  11/20/15 0600 11/21/15 0430 11/22/15 0623 11/23/15 0622 11/24/15 0545  WBC 10.8*  --   --  8.9 8.4 2.4* 2.8*  CREATININE 1.63*  < > 1.11* 1.02* 0.93 0.90 0.88  < > = values in this interval not displayed.  Estimated Creatinine Clearance: 62.9 mL/min (by C-G formula based on Cr of 0.88).    Allergies  Allergen Reactions  . Morphine And Related Nausea And Vomiting    Antimicrobials this admission: 2/27 Levaquin >> 2/28 2/28 Cefepime >>    Dose adjustments this admission:   Microbiology results: 2/26 BCx: NG x 2days 2/28 BCx:   2/28 BCx (peripheral line):   Thank you for allowing pharmacy to be a part of this patient's care.  Everette Rank, PharmD 11/24/2015 4:05 PM

## 2015-11-24 NOTE — Consult Note (Signed)
[facility] CM Inpatient Consult   11/24/2015  Debra Farrell 1945-11-13 [id number]    Came to visit patient at bedside on behalf of [employer] [facility] Care Management program for [facility] employees/dependents with [facility] insurance. She was currently getting her tube feeds. Will continue to follow along.   Raiford Noble, MSN-Ed, RN,BSN [facility] Liaison [phone]

## 2015-11-24 NOTE — Progress Notes (Signed)
I was called to evaluate this patient with recurrent fever. The patient did not feel sick. Denies cough. She continues a mild leak around the feeding tube. I plan to do the following:  1) REPEAT cbc NOW  2) Repeat blood culture and urine culture 3) CT scan of the abdomen to look for possible abdominal abscess due to recent leak around the feeding tube 4) Consult pharmacy and change antibiotics to vancomycin and cefepime

## 2015-11-24 NOTE — Progress Notes (Signed)
Debra Farrell   DOB:08/18/1946   WU#:[id number]    Subjective: she is complaining of persistent minor leak around the feeding tube. She was able to tolerate intermittent bolus tube feeds. Due to altered taste sensation, she does not have much appetite to consume oral diet. She denies nausea or vomiting She had low-grade fever yesterday, improved compared to the day before She denies any cough; denies dizziness, chest pain or shortness of breath  Objective:  Filed Vitals:   11/24/15 0130 11/24/15 0601  BP:  148/60  Pulse:  98  Temp: 99.3 F (37.4 C) 98.5 F (36.9 C)  Resp:  18     Intake/Output Summary (Last 24 hours) at 11/24/15 0801 Last data filed at 11/24/15 0601  Gross per 24 hour  Intake 1484.5 ml  Output    450 ml  Net 1034.5 ml    GENERAL:alert, no distress and comfortable SKIN: skin color, texture, turgor are normal, no rashes or significant lesions EYES: normal, Conjunctiva are pink and non-injected, sclera clear OROPHARYNX:no exudate, no erythema and lips, buccal mucosa, and tongue normal  NECK: supple, thyroid normal size, non-tender, without nodularity LYMPH:  Persistent lymphadenopathy on the right side of the neck LUNGS: clear to auscultation and percussion with normal breathing effort. Noted crackles at both lung bases HEART: regular rate & rhythm and no murmurs and no lower extremity edema ABDOMEN:abdomen soft, non-tender and normal bowel sounds. Noted minor leak around the feeding tube Musculoskeletal:no cyanosis of digits and no clubbing  NEURO: alert & oriented x 3 with fluent speech, no focal motor/sensory deficits   Labs:  Lab Results  Component Value Date   WBC 2.8* 11/24/2015   HGB 7.7* 11/24/2015   HCT 23.9* 11/24/2015   MCV 64.8* 11/24/2015   PLT 204 11/24/2015   NEUTROABS 1.1* 11/24/2015    Lab Results  Component Value Date   NA 135 11/24/2015   K 3.6 11/24/2015   CL 101 11/24/2015   CO2 25 11/24/2015    Studies:  Dg Chest 2  View  11/22/2015  CLINICAL DATA:  Dry cough for 2 days EXAM: CHEST  2 VIEW COMPARISON:  10/14/2015 FINDINGS: Cardiac shadow is within normal limits. Right chest wall port is noted in satisfactory position. Bibasilar infiltrative changes are noted right greater than left. Small effusions are seen as well. No bony abnormality is noted. IMPRESSION: Bibasilar infiltrates with small effusions. Electronically Signed   By: Alcide Clever M.D.   On: 11/22/2015 17:26   Ir Cm Inj Any Colonic Tube W/fluoro  11/23/2015  INDICATION: Percutaneous gastrostomy tube placed 11/05/2015. Concern for possible leak. EXAM: GI TUBE INJECTION MEDICATIONS: None; Antibiotics were administered within 1 hour of the procedure. Glucagon 1 mg IV ANESTHESIA/SEDATION: Moderate Sedation Time:  None The patient was continuously monitored during the procedure by the interventional radiology nurse under my direct supervision. CONTRAST:  10 mL Omnipaque 300 - administered into the gastric lumen. FLUOROSCOPY TIME:  Fluoroscopy Time: 6 seconds COMPLICATIONS: None immediate. PROCEDURE: Informed written consent was obtained from the patient after a thorough discussion of the procedural risks, benefits and alternatives. All questions were addressed. Maximal Sterile Barrier Technique was utilized including caps, mask, sterile gowns, sterile gloves, sterile drape, hand hygiene and skin antiseptic. A timeout was performed prior to the initiation of the procedure. Gastrostomy tube was inspected and found to be without obvious defects. The tube was then injected with approximately 10 cc of contrast which promptly filled the gastric lumen. Several images were taken confirming no  evidence of leak or obstruction. Gastrostomy tube is in proper position. IMPRESSION: Properly position the gastrostomy tube without evidence of leak. Read by: Brayton El PA-C Electronically Signed   By: Jolaine Click M.D.   On: 11/23/2015 13:00    Assessment & Plan:   Cancer of  base of tongue (HCC) Unfortunately, she developed intractable nausea and vomiting, multifactorial, could be related to side effects of chemotherapy, compounded by severe gastritis and recent constipation Continue supportive care OK to continue radiation She may not be able to tolerate further chemotherapy  Intractable nausea and vomiting, improving She is clinically dehydrated. She received IV dexamethasone for 2 days on 2/16 & 2/17 Continue scopolamine patch, switching Reglan to PO and IV Zofran to prn Abdominal X ray is within normal limits  Gastritis, acute She has severe acute gastritis, exacerbating her nausea and vomiting. Continue protonix She did not tolerate Carafate. We switched her to GI cocktail  Hyponatremia, resolved Acute renal failure, resolved Due to dehydration and recent cisplatin Continue IVF resuscitation Monitor daily Renal ultrasound is OK, uric acid OK  Moderate protein calorie malnutriton Poor oral intake, improving Consult nutritionist Advancing to regular diet and intermittent bolus feeding  Leak around feeding tube Consulted IR. The feeding tube in position  Essential Hypertension Was on HCTZ at home/baseline. Due to dehydration, this is stopped Started on amlodipine, added hydralazine  Pain at surgical incision, resolved Her pain has improved with IV Dilaudid. Discontinue pain medicine due to recent falls  Electrolyte imbalance with hypomagnesemia and hypokalemia, resolved Replacement started, improving Monitor daily  Thalassemia, acute on chronic anemia Received blood transfusion on 11/15/15 Labs are drastically different today. Not symptomatic. No need transfusion  DVT prophylaxis On Lovenox  Febrile episode Possible lung infiltrate The patient is not neutropenic On levofloxacin since 11/23/15. Still febrile  Discharge planning  Not ready for discharge due to unresolved fever Possibly tomorrow   [facility], Beau Ramsburg, MD 11/24/2015   8:01 AM

## 2015-11-24 NOTE — Progress Notes (Signed)
[place] [facility] Therapy Treatment Record Phone [phone]   Radiation Therapy was administered to Debra Farrell on: 11/24/2015  3:02 PM and was treatment # *9* out of a planned course of *35** treatments.

## 2015-11-24 NOTE — Progress Notes (Signed)
Notified MD of pt temp, MD to come down to evaluate pt.

## 2015-11-24 NOTE — Progress Notes (Signed)
Nutrition Follow-up  DOCUMENTATION CODES:   Severe malnutrition in context of acute illness/injury  INTERVENTION:   Recommend continue to monitor for refeeding syndrome.   Bolus feeds schedule:  1 can Vital AF 1.2 every 4 hours (0700, 1100, 1500, 1900) Recommend water flushes of 30 ml before and after each bolus. 30 ml Prostat TID Tube feeding regimen to provide 1436 kcal (68% of needs), 117g protein (100% of needs) and 1008 ml H2O.  If patient is not receiving IVF, recommend free water flushes of 240 ml every 6 hours.  RD to continue to monitor  NUTRITION DIAGNOSIS:   Malnutrition related to acute illness as evidenced by energy intake < or equal to 50% for > or equal to 5 days, severe depletion of body fat.  Ongoing.  GOAL:   Patient will meet greater than or equal to 90% of their needs  Progressing. TF at goal but PO intake is still improving.  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 She is followed by Dr. Alvy Bimler and a request has been made for a [facility] for chemo and a g-tube. Of note, she does have thyroid nodules as well that were biopsied by Dr. Anselm Pancoast yesterday.  Patient's PEG continued to leak overnight. Per RN, PEG was adjusted and at the moment is not leaking. Pt has been receiving her bolus feeds and tolerating without issue. Pt has not eaten much today but states she did order some lunch (popsicle, italian ice, 155 kcal). At bedside, pt has bottles of chocolate almond milk which her daughter brought for her (these provide 120 kcal, 1 g protein). Pt very eager to go home. May discharge tomorrow.   Labs reviewed. Medications: Folic acid tablet daily, Mag-Ox tablet BID, Reglan QID  Diet Order:  Diet regular Room service appropriate?: Yes; Fluid consistency:: Thin  Skin:  Wound (see comment) (Lip wound)  Last BM:  2/27  Height:   Ht  Readings from Last 1 Encounters:  11/13/15 '5\' 6"'$  (1.676 m)    Weight:   Wt Readings from Last 1 Encounters:  11/24/15 163 lb (73.936 kg)    Ideal Body Weight:  59.1 kg  BMI:  Body mass index is 26.32 kg/(m^2).  Estimated Nutritional Needs:   Kcal:  2100-2300  Protein:  110-120g  Fluid:  2.1L/day  EDUCATION NEEDS:   Education needs addressed  Clayton Bibles, MS, RD, LDN Pager: [phone] After Hours Pager: [phone]

## 2015-11-25 ENCOUNTER — Ambulatory Visit: Payer: Self-pay | Admitting: Hematology and Oncology

## 2015-11-25 ENCOUNTER — Ambulatory Visit
Admit: 2015-11-25 | Discharge: 2015-11-25 | Disposition: A | Discharge status: Home / Self Care | Payer: 59 | Attending: Radiation Oncology | Admitting: Radiation Oncology

## 2015-11-25 ENCOUNTER — Inpatient Hospital Stay (HOSPITAL_COMMUNITY): Payer: 59

## 2015-11-25 ENCOUNTER — Telehealth: Payer: Self-pay

## 2015-11-25 ENCOUNTER — Ambulatory Visit: Payer: 59

## 2015-11-25 ENCOUNTER — Other Ambulatory Visit: Payer: Self-pay

## 2015-11-25 ENCOUNTER — Ambulatory Visit: Payer: Self-pay

## 2015-11-25 ENCOUNTER — Encounter (HOSPITAL_COMMUNITY): Payer: Self-pay | Admitting: Radiology

## 2015-11-25 DIAGNOSIS — R109 Unspecified abdominal pain: Secondary | ICD-10-CM | POA: Insufficient documentation

## 2015-11-25 DIAGNOSIS — L0291 Cutaneous abscess, unspecified: Secondary | ICD-10-CM | POA: Insufficient documentation

## 2015-11-25 LAB — PREPARE RBC (CROSSMATCH)

## 2015-11-25 LAB — BASIC METABOLIC PANEL
ANION GAP: 9 (ref 5–15)
BUN: 20 mg/dL (ref 6–20)
CALCIUM: 8 mg/dL — AB (ref 8.9–10.3)
CO2: 25 mmol/L (ref 22–32)
Chloride: 96 mmol/L — ABNORMAL LOW (ref 101–111)
Creatinine, Ser: 0.9 mg/dL (ref 0.44–1.00)
Glucose, Bld: 110 mg/dL — ABNORMAL HIGH (ref 65–99)
Potassium: 2.7 mmol/L — CL (ref 3.5–5.1)
SODIUM: 130 mmol/L — AB (ref 135–145)

## 2015-11-25 LAB — CBC WITH DIFFERENTIAL/PLATELET
BASOS ABS: 0 10*3/uL (ref 0.0–0.1)
Basophils Relative: 1 %
EOS PCT: 1 %
Eosinophils Absolute: 0 10*3/uL (ref 0.0–0.7)
HEMATOCRIT: 22.8 % — AB (ref 36.0–46.0)
Hemoglobin: 7.3 g/dL — ABNORMAL LOW (ref 12.0–15.0)
Lymphocytes Relative: 36 %
Lymphs Abs: 0.7 10*3/uL (ref 0.7–4.0)
MCH: 20.6 pg — ABNORMAL LOW (ref 26.0–34.0)
MCHC: 32 g/dL (ref 30.0–36.0)
MCV: 64.2 fL — ABNORMAL LOW (ref 78.0–100.0)
MONO ABS: 0.4 10*3/uL (ref 0.1–1.0)
MONOS PCT: 21 %
NEUTROS PCT: 41 %
Neutro Abs: 0.8 10*3/uL — ABNORMAL LOW (ref 1.7–7.7)
Platelets: 257 10*3/uL (ref 150–400)
RBC: 3.55 MIL/uL — AB (ref 3.87–5.11)
RDW: 20 % — AB (ref 11.5–15.5)
WBC: 1.9 10*3/uL — AB (ref 4.0–10.5)

## 2015-11-25 LAB — POTASSIUM: Potassium: 2.8 mmol/L — ABNORMAL LOW (ref 3.5–5.1)

## 2015-11-25 LAB — PHOSPHORUS: PHOSPHORUS: 2.6 mg/dL (ref 2.5–4.6)

## 2015-11-25 LAB — MAGNESIUM: MAGNESIUM: 1.2 mg/dL — AB (ref 1.7–2.4)

## 2015-11-25 MED ORDER — FENTANYL CITRATE (PF) 100 MCG/2ML IJ SOLN
INTRAMUSCULAR | Status: AC
  Filled 2015-11-25: qty 4

## 2015-11-25 MED ORDER — MIDAZOLAM HCL 2 MG/2ML IJ SOLN
INTRAMUSCULAR | Status: AC
  Filled 2015-11-25: qty 6

## 2015-11-25 MED ORDER — MIDAZOLAM HCL 2 MG/2ML IJ SOLN
INTRAMUSCULAR | Status: AC | PRN
  Administered 2015-11-25 (×4): 1 mg via INTRAVENOUS

## 2015-11-25 MED ORDER — SODIUM CHLORIDE 0.9 % IV SOLN
Freq: Once | INTRAVENOUS | Status: AC
  Administered 2015-11-25: 18:00:00 via INTRAVENOUS

## 2015-11-25 MED ORDER — FENTANYL CITRATE (PF) 100 MCG/2ML IJ SOLN
INTRAMUSCULAR | Status: AC | PRN
  Administered 2015-11-25: 50 ug via INTRAVENOUS

## 2015-11-25 MED ORDER — MAGNESIUM SULFATE 4 GM/100ML IV SOLN
4.0000 g | Freq: Once | INTRAVENOUS | Status: AC
  Administered 2015-11-25: 4 g via INTRAVENOUS
  Filled 2015-11-25: qty 100

## 2015-11-25 MED ORDER — POTASSIUM CHLORIDE 10 MEQ/50ML IV SOLN
10.0000 meq | INTRAVENOUS | Status: AC
  Administered 2015-11-25 (×3): 10 meq via INTRAVENOUS
  Filled 2015-11-25 (×2): qty 50

## 2015-11-25 MED ORDER — HYDROCODONE-ACETAMINOPHEN 5-325 MG PO TABS: 1.0000 | ORAL_TABLET | ORAL | Status: DC | PRN

## 2015-11-25 MED ORDER — POTASSIUM CHLORIDE 10 MEQ/50ML IV SOLN
INTRAVENOUS | Status: AC
  Administered 2015-11-25: 10 meq via INTRAVENOUS
  Filled 2015-11-25: qty 50

## 2015-11-25 MED ORDER — POTASSIUM CHLORIDE 20 MEQ/15ML (10%) PO SOLN
40.0000 meq | Freq: Two times a day (BID) | ORAL | Status: DC
  Administered 2015-11-26 – 2015-11-30 (×9): 40 meq via ORAL
  Filled 2015-11-25 (×11): qty 30

## 2015-11-25 MED ORDER — POTASSIUM CHLORIDE 10 MEQ/50ML IV SOLN
10.0000 meq | INTRAVENOUS | Status: AC
  Administered 2015-11-25 (×3): 10 meq via INTRAVENOUS
  Filled 2015-11-25 (×3): qty 50

## 2015-11-25 MED ORDER — TBO-FILGRASTIM 300 MCG/0.5ML ~~LOC~~ SOSY
300.0000 ug | PREFILLED_SYRINGE | Freq: Every day | SUBCUTANEOUS | Status: DC
Start: 2015-11-25 — End: 2015-11-26
  Administered 2015-11-25: 300 ug via SUBCUTANEOUS
  Filled 2015-11-25 (×2): qty 0.5

## 2015-11-25 NOTE — Progress Notes (Signed)
K level of 2.8 was called to Dr. Alvy Bimler, new orders received.

## 2015-11-25 NOTE — Progress Notes (Addendum)
Nutrition Follow-up  DOCUMENTATION CODES:   Severe malnutrition in context of acute illness/injury  INTERVENTION:   Recommend continue to monitor for refeeding syndrome.   Resume tube feedings when medically able Bolus feeds schedule:  1 can Vital AF 1.2 every 4 hours (0700, 1100, 1500, 1900) Recommend water flushes of 30 ml before and after each bolus. 30 ml Prostat TID Tube feeding regimen to provide 1436 kcal (68% of needs), 117g protein (100% of needs) and 1008 ml H2O.  If patient is not receiving IVF, recommend free water flushes of 240 ml every 6 hours.  RD to continue to monitor  NUTRITION DIAGNOSIS:   Malnutrition related to acute illness as evidenced by energy intake < or equal to 50% for > or equal to 5 days, severe depletion of body fat.  Ongoing.  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 She is followed by Dr. Alvy Bimler and a request has been made for a [facility] for chemo and a g-tube. Of note, she does have thyroid nodules as well that were biopsied by Dr. Anselm Pancoast yesterday.  Patient received 3 boluses of Vital AF 1.2 and Prostat TID yesterday (providing 1152 kcal and 97g protein). Did not receive last bolus d/t N/V from contrast. IR evaluated and found a large intra-abdominal abscess. Plan is for patient to have drain placed today. Last bolus of TF was given this AM around 0540. Once medically able, recommend resume bolus feeds. Need to continue to monitor for refeeding, Mg and K with low lab values.   Medications: folic acid tablet daily, Mag-Ox tablet BID, Reglan QID,  Labs reviewed: Low Na, K, Mg Phos WNL  Diet Order:  Diet NPO time specified  Skin:  Wound (see comment) (Lip wound)  Last BM:  2/28  Height:   Ht Readings from Last 1 Encounters:   11/13/15 '5\' 6"'$  (1.676 m)    Weight:   Wt Readings from Last 1 Encounters:  11/25/15 164 lb (74.39 kg)    Ideal Body Weight:  59.1 kg  BMI:  Body mass index is 26.48 kg/(m^2).  Estimated Nutritional Needs:   Kcal:  2100-2300  Protein:  110-120g  Fluid:  2.1L/day  EDUCATION NEEDS:   Education needs addressed  Clayton Bibles, MS, RD, LDN Pager: [phone] After Hours Pager: [phone]

## 2015-11-25 NOTE — Procedures (Signed)
CT abscess drain catheter placement 70F into ant peritoneal collection No complication No blood loss. See complete dictation in [facility]: Lambert Rhonda W.

## 2015-11-25 NOTE — Sedation Documentation (Signed)
Patient denies pain and is resting comfortably.  

## 2015-11-25 NOTE — Telephone Encounter (Signed)
Debra Farrell had a procedure in Radiology today. She is being monitored after this procedure, and her BP is slightly high, and she has a temperature. After speaking with her nurse on the floor, Debra Farrell has declined coming for her radiation treatment today. The Radiation therapist has been made aware.

## 2015-11-25 NOTE — Progress Notes (Signed)
MEDICATION RELATED CONSULT NOTE   IR Procedure Consult - Anticoagulant/Antiplatelet PTA/Inpatient Med List Review by Pharmacist    Procedure:   Peritoneal drain placement Completed:   3/1 4:00 pm Post-Procedural bleeding risk per IR MD assessment:  Low  Antithrombotic medications on inpatient or PTA profile prior to procedure:   Lovenox (prophylaxis dose)    Recommended restart time per IR Post-Procedure Guidelines:  Day 0, 4 hrs after completion of procedure   Other considerations:   none   Plan:   Retime Lovenox to 8:00 pm; no further action required  Reuel Boom, PharmD, BCPS Pager: [phone] 11/25/2015, 4:37 PM

## 2015-11-25 NOTE — Progress Notes (Signed)
Debra Farrell   DOB:1946-09-22   ZO#:[id number]    Subjective: The patient continues to have intermittent fever since yesterday. This morning, she is noted to be mildly neutropenic. CT scan from yesterday show large intra-abdominal abscess. She denies nausea or vomiting. Denies abdomen pain. She has no cough.  Objective:  Filed Vitals:   11/25/15 0534 11/25/15 0728  BP: 148/65   Pulse: 104   Temp: 102.2 F (39 C) 99.2 F (37.3 C)  Resp: 16      Intake/Output Summary (Last 24 hours) at 11/25/15 0925 Last data filed at 11/25/15 0534  Gross per 24 hour  Intake 3128.75 ml  Output   1900 ml  Net 1228.75 ml    GENERAL:alert, no distress and comfortable SKIN: skin color, texture, turgor are normal, no rashes or significant lesions EYES: normal, Conjunctiva are pink and non-injected, sclera clear OROPHARYNX:no exudate, no erythema and lips, buccal mucosa, and tongue normal  NECK: supple, thyroid normal size, non-tender, without nodularity LYMPH: She have persistent lymphadenopathy in the right side of the neck  LUNGS: She has normal breathing effort. Reduced breath sounds on both lung bases  HEART: regular rate & rhythm and no murmurs and no lower extremity edema ABDOMEN:abdomen soft, non-tender and normal bowel sounds. Feeding tube site looks okay Musculoskeletal:no cyanosis of digits and no clubbing  NEURO: alert & oriented x 3 with fluent speech, no focal motor/sensory deficits   Labs:  Lab Results  Component Value Date   WBC 1.9* 11/25/2015   HGB 7.3* 11/25/2015   HCT 22.8* 11/25/2015   MCV 64.2* 11/25/2015   PLT 257 11/25/2015   NEUTROABS 0.8* 11/25/2015    Lab Results  Component Value Date   NA 130* 11/25/2015   K 2.7* 11/25/2015   CL 96* 11/25/2015   CO2 25 11/25/2015    Studies:  Ct Abdomen Pelvis W Contrast  11/24/2015  CLINICAL DATA:  Acute onset of fever and leukopenia. Leak about the G-tube. Assess for abscess. Initial encounter. EXAM: CT ABDOMEN AND PELVIS  WITH CONTRAST TECHNIQUE: Multidetector CT imaging of the abdomen and pelvis was performed using the standard protocol following bolus administration of intravenous contrast. CONTRAST:  50mL OMNIPAQUE IOHEXOL 300 MG/ML SOLN, OMNIPAQUE IOHEXOL 300 MG/ML SOLN COMPARISON:  PET/CT performed 10/14/2015, and renal ultrasound performed 11/17/2015 FINDINGS: Small bilateral pleural effusions are noted, with bibasilar opacities likely reflecting atelectasis. There is a large collection of fluid and air noted tracking along the anterior aspect of the liver, measuring approximately 12.4 x 11.0 x 4.8 cm, with a relatively thin peripheral rind, compatible with an abscess. This rise adjacent to the body and antrum of the stomach, with mild surrounding soft tissue inflammation tracking inferiorly along the omentum. The patient's G-tube is noted within the stomach. The stomach is largely decompressed. Stones are noted within the gallbladder. The gallbladder is otherwise unremarkable. The pancreas and adrenal glands are grossly unremarkable. A 1.0 cm cyst is noted at the interpole region of the right kidney. The kidneys are otherwise unremarkable. There is no evidence of hydronephrosis. No renal or ureteral stones are seen. Minimal nonspecific perinephric stranding is noted bilaterally. No free fluid is identified. The small bowel is unremarkable in appearance. No acute vascular abnormalities are seen. Minimal calcification is noted along the abdominal aorta and its branches. The appendix is normal in caliber and contains contrast, without evidence of appendicitis. Contrast progresses to the level of the rectum. The colon is unremarkable in appearance. Apparent mild wall thickening at the  rectum is thought to be transient in nature. The bladder is mildly distended and grossly unremarkable. The patient is status post hysterectomy. No suspicious adnexal masses are seen. The ovaries are relatively symmetric. Scattered postoperative  change is noted within the pelvis. No inguinal lymphadenopathy is seen. No acute osseous abnormalities are identified. IMPRESSION: 1. Large abscess noted along the anterior aspect of the liver, adjacent to the body and antrum of the stomach, measuring 12.4 x 11.0 x 4.8 cm, containing fluid and air, with a peripheral rind. Mild surrounding soft tissue inflammation tracks about the antrum of the stomach and inferiorly along the omentum. 2. Small bilateral pleural effusions, with bibasilar airspace opacities likely reflecting atelectasis. 3. G-tube remains within the stomach. The stomach is largely decompressed. 4. Cholelithiasis.  Gallbladder otherwise unremarkable. 5. Small right renal cyst seen. These results were called by telephone at the time of interpretation on 11/24/2015 at 10:14 pm to [facility] at [facility], who verbally acknowledged these results. Electronically Signed   By: Roanna Raider M.D.   On: 11/24/2015 22:15   Ir Cm Inj Any Colonic Tube W/fluoro  11/23/2015  INDICATION: Percutaneous gastrostomy tube placed 11/05/2015. Concern for possible leak. EXAM: GI TUBE INJECTION MEDICATIONS: None; Antibiotics were administered within 1 hour of the procedure. Glucagon 1 mg IV ANESTHESIA/SEDATION: Moderate Sedation Time:  None The patient was continuously monitored during the procedure by the interventional radiology nurse under my direct supervision. CONTRAST:  10 mL Omnipaque 300 - administered into the gastric lumen. FLUOROSCOPY TIME:  Fluoroscopy Time: 6 seconds COMPLICATIONS: None immediate. PROCEDURE: Informed written consent was obtained from the patient after a thorough discussion of the procedural risks, benefits and alternatives. All questions were addressed. Maximal Sterile Barrier Technique was utilized including caps, mask, sterile gowns, sterile gloves, sterile drape, hand hygiene and skin antiseptic. A timeout was performed prior to the initiation of the procedure. Gastrostomy tube was  inspected and found to be without obvious defects. The tube was then injected with approximately 10 cc of contrast which promptly filled the gastric lumen. Several images were taken confirming no evidence of leak or obstruction. Gastrostomy tube is in proper position. IMPRESSION: Properly position the gastrostomy tube without evidence of leak. Read by: Brayton El PA-C Electronically Signed   By: Jolaine Click M.D.   On: 11/23/2015 13:00    Assessment & Plan:   Cancer of base of tongue (HCC) Unfortunately, she developed intractable nausea and vomiting, multifactorial, could be related to side effects of chemotherapy, compounded by severe gastritis and recent constipation Continue supportive care Today, she is mildly neutropenic. I will defer to radiation oncologist to determine the plan regarding radiation treatment She may not be able to tolerate further chemotherapy due to multiple complications  Intractable nausea and vomiting, improving This was related to side effects of treatment  Continue scopolamine patch, switching Reglan to PO and IV Zofran to prn  Gastritis, acute She has severe acute gastritis, exacerbating her nausea and vomiting. Continue protonix She did not tolerate Carafate. 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 The feeding tube in position but with a large  intra-abdominal abscess, she may need placement of drain  Essential Hypertension Was on HCTZ at home/baseline. Due to dehydration, this is stopped Started on amlodipine, added hydralazine  Pain at surgical incision, resolved Her pain has improved with IV Dilaudid. Discontinue pain medicine due to recent falls  Thalassemia, acute on chronic anemia Received blood transfusion on 11/15/15 She have evidence of significant bone marrow suppression due to ongoing infection and is mildly symptomatic with tachycardia I recommend 2 units of blood transfusion and she agreed to proceed  Neutropenic fever This is related to ongoing treatment and intra-abdominal abscess I will start her on G-CSF to keep ANC greater than 1.5 She is switched to vancomycin and cefepime due to unresolved fever I will consult infectious disease after drain is placed by IR to determine the duration of antibiotic therapy  DVT prophylaxis On Lovenox  Discharge planning  Not ready for discharge due to unresolved fever and abscess   Jaqwon Manfred, MD 11/25/2015  9:25 AM

## 2015-11-25 NOTE — Progress Notes (Signed)
Received results of CT of abdomen last night around 2215 pm.  Spoke with Dr. Alen Blew on call regarding results.Debra Farrell

## 2015-11-25 NOTE — Progress Notes (Signed)
Patient ID: Debra Farrell, female   DOB: 1946-01-19, 70 y.o.   MRN: [id number]    Referring Physician(s): Oda Kilts  Supervising Physician: Oley Balm  Chief Complaint:  Abdominal abscess  Subjective: Patient familiar to IR service from prior gastrostomy tube placement and Port-A-Cath placement on 11/05/15. Patient has a history of base of tongue carcinoma. She has recently noticed increased leaking from around the gastrostomy tube site along with worsening abdominal pain and persistent fevers. Contrast injection of the gastrostomy tube on 2/27 revealed properly positioned tube without evidence of leak. Subsequent CT scan of the abdomen and pelvis performed on 2/28 revealed a large abscess noted along the anterior aspect of the liver adjacent to the body and antrum of the stomach, containing fluid and air with peripheral rind. There were small bilateral pleural effusions. G-tube was within stomach and stomach was largely decompressed. Cholelithiasis as well as a small right renal cyst were noted. Request now received from oncology for CT-guided drainage of the above-mentioned abdominal abscess. Patient currently denies headache, chest pain, dyspnea, back pain, nausea, vomiting and abnormal bleeding.  Allergies: Morphine and related  Medications: Prior to Admission medications   Medication Sig Start Date End Date Taking? Authorizing Provider  acetaminophen-codeine (TYLENOL #3) 300-30 MG tablet Take 1 tablet by mouth every 6 (six) hours as needed. pain 10/30/15  Yes Historical Provider, MD  dexamethasone (DECADRON) 4 MG tablet Take 2 tablets by mouth once a day on the day after chemotherapy and then take 2 tablets two times a day for 2 days. Take with food. 11/10/15  Yes Artis Delay, MD  hydrochlorothiazide (HYDRODIURIL) 25 MG tablet Take 25 mg by mouth daily.   Yes Historical Provider, MD  HYDROcodone-acetaminophen (NORCO/VICODIN) 5-325 MG tablet Take 1 tablet by mouth every 6 (six) hours as  needed. pain 11/03/15  Yes Historical Provider, MD  ibuprofen (ADVIL,MOTRIN) 200 MG tablet Take 400 mg by mouth every 6 (six) hours as needed for moderate pain.   Yes Historical Provider, MD  lidocaine-prilocaine (EMLA) cream Apply to affected area once Patient taking differently: Apply 1 application topically daily as needed (port access). Apply to affected area once 11/10/15  Yes Artis Delay, MD  oxyCODONE-acetaminophen (PERCOCET) 10-325 MG tablet Take 1 tablet by mouth every 4 (four) hours as needed for pain. 11/06/15  Yes Artis Delay, MD  prochlorperazine (COMPAZINE) 10 MG tablet Take 1 tablet (10 mg total) by mouth every 6 (six) hours as needed (Nausea or vomiting). 11/10/15  Yes Artis Delay, MD  scopolamine (TRANSDERM-SCOP) 1 MG/3DAYS Place 1 patch (1.5 mg total) onto the skin every 3 (three) days. 11/13/15  Yes Artis Delay, MD  ondansetron (ZOFRAN) 8 MG tablet Take 1 tablet (8 mg total) by mouth 2 (two) times daily as needed. Start on the third day after chemotherapy. Patient not taking: Reported on 11/13/2015 11/10/15   Artis Delay, MD     Vital Signs: BP 148/65 mmHg  Pulse 104  Temp(Src) 99.2 F (37.3 C) (Oral)  Resp 16  Ht 5\' 6"  (1.676 m)  Wt 164 lb (74.39 kg)  BMI 26.48 kg/m2  SpO2 93%  Physical Exam patient awake, alert. Warm to touch. Chest with slightly diminished breath sounds at bases. Right chest wall Port-A-Cath is intact, site clean, no hematoma, nontender. Heart with regular rate and rhythm. Abdomen soft, few bowel sounds, gastrostomy tube intact with obvious leaking and air /bubbling noted at insertion site; site moderately tender to palpation. Lower extremities with no significant edema.  Imaging: Dg Chest  2 View  11/22/2015  CLINICAL DATA:  Dry cough for 2 days EXAM: CHEST  2 VIEW COMPARISON:  10/14/2015 FINDINGS: Cardiac shadow is within normal limits. Right chest wall port is noted in satisfactory position. Bibasilar infiltrative changes are noted right greater than left. Small  effusions are seen as well. No bony abnormality is noted. IMPRESSION: Bibasilar infiltrates with small effusions. Electronically Signed   By: Alcide Clever M.D.   On: 11/22/2015 17:26   Ct Abdomen Pelvis W Contrast  11/24/2015  CLINICAL DATA:  Acute onset of fever and leukopenia. Leak about the G-tube. Assess for abscess. Initial encounter. EXAM: CT ABDOMEN AND PELVIS WITH CONTRAST TECHNIQUE: Multidetector CT imaging of the abdomen and pelvis was performed using the standard protocol following bolus administration of intravenous contrast. CONTRAST:  50mL OMNIPAQUE IOHEXOL 300 MG/ML SOLN, OMNIPAQUE IOHEXOL 300 MG/ML SOLN COMPARISON:  PET/CT performed 10/14/2015, and renal ultrasound performed 11/17/2015 FINDINGS: Small bilateral pleural effusions are noted, with bibasilar opacities likely reflecting atelectasis. There is a large collection of fluid and air noted tracking along the anterior aspect of the liver, measuring approximately 12.4 x 11.0 x 4.8 cm, with a relatively thin peripheral rind, compatible with an abscess. This rise adjacent to the body and antrum of the stomach, with mild surrounding soft tissue inflammation tracking inferiorly along the omentum. The patient's G-tube is noted within the stomach. The stomach is largely decompressed. Stones are noted within the gallbladder. The gallbladder is otherwise unremarkable. The pancreas and adrenal glands are grossly unremarkable. A 1.0 cm cyst is noted at the interpole region of the right kidney. The kidneys are otherwise unremarkable. There is no evidence of hydronephrosis. No renal or ureteral stones are seen. Minimal nonspecific perinephric stranding is noted bilaterally. No free fluid is identified. The small bowel is unremarkable in appearance. No acute vascular abnormalities are seen. Minimal calcification is noted along the abdominal aorta and its branches. The appendix is normal in caliber and contains contrast, without evidence of  appendicitis. Contrast progresses to the level of the rectum. The colon is unremarkable in appearance. Apparent mild wall thickening at the rectum is thought to be transient in nature. The bladder is mildly distended and grossly unremarkable. The patient is status post hysterectomy. No suspicious adnexal masses are seen. The ovaries are relatively symmetric. Scattered postoperative change is noted within the pelvis. No inguinal lymphadenopathy is seen. No acute osseous abnormalities are identified. IMPRESSION: 1. Large abscess noted along the anterior aspect of the liver, adjacent to the body and antrum of the stomach, measuring 12.4 x 11.0 x 4.8 cm, containing fluid and air, with a peripheral rind. Mild surrounding soft tissue inflammation tracks about the antrum of the stomach and inferiorly along the omentum. 2. Small bilateral pleural effusions, with bibasilar airspace opacities likely reflecting atelectasis. 3. G-tube remains within the stomach. The stomach is largely decompressed. 4. Cholelithiasis.  Gallbladder otherwise unremarkable. 5. Small right renal cyst seen. These results were called by telephone at the time of interpretation on 11/24/2015 at 10:14 pm to [facility] at [facility], who verbally acknowledged these results. Electronically Signed   By: Roanna Raider M.D.   On: 11/24/2015 22:15   Ir Cm Inj Any Colonic Tube W/fluoro  11/23/2015  INDICATION: Percutaneous gastrostomy tube placed 11/05/2015. Concern for possible leak. EXAM: GI TUBE INJECTION MEDICATIONS: None; Antibiotics were administered within 1 hour of the procedure. Glucagon 1 mg IV ANESTHESIA/SEDATION: Moderate Sedation Time:  None The patient was continuously monitored during the procedure  by the interventional radiology nurse under my direct supervision. CONTRAST:  10 mL Omnipaque 300 - administered into the gastric lumen. FLUOROSCOPY TIME:  Fluoroscopy Time: 6 seconds COMPLICATIONS: None immediate. PROCEDURE: Informed written  consent was obtained from the patient after a thorough discussion of the procedural risks, benefits and alternatives. All questions were addressed. Maximal Sterile Barrier Technique was utilized including caps, mask, sterile gowns, sterile gloves, sterile drape, hand hygiene and skin antiseptic. A timeout was performed prior to the initiation of the procedure. Gastrostomy tube was inspected and found to be without obvious defects. The tube was then injected with approximately 10 cc of contrast which promptly filled the gastric lumen. Several images were taken confirming no evidence of leak or obstruction. Gastrostomy tube is in proper position. IMPRESSION: Properly position the gastrostomy tube without evidence of leak. Read by: Brayton El PA-C Electronically Signed   By: Jolaine Click M.D.   On: 11/23/2015 13:00    Labs:  CBC:  Recent Labs  11/23/15 0622 11/24/15 0545 11/24/15 1622 11/25/15 0520  WBC 2.4* 2.8* 2.0* 1.9*  HGB 12.3 7.7* 7.3* 7.3*  HCT 37.4 23.9* 22.7* 22.8*  PLT 91* 204 204 257    COAGS:  Recent Labs  10/27/15 1000 11/05/15 1212  INR 1.08 1.18  APTT  --  35    BMP:  Recent Labs  11/22/15 0623 11/23/15 0622 11/24/15 0545 11/25/15 0520  NA 131* 136 135 130*  K 2.9* 4.1 3.6 2.7*  CL 96* 101 101 96*  CO2 26 26 25 25   GLUCOSE 144* 99 104* 110*  BUN 25* 19 20 20   CALCIUM 7.9* 8.4* 8.4* 8.0*  CREATININE 0.93 0.90 0.88 0.90  GFRNONAA >60 >60 >60 >60  GFRAA >60 >60 >60 >60    LIVER FUNCTION TESTS:  Recent Labs  11/21/15 0430 11/22/15 0623 11/23/15 0622 11/24/15 0545  BILITOT 0.8 1.1 1.4* 1.4*  AST 25 31 34 33  ALT 24 31 30 31   ALKPHOS 89 108 91 88  PROT 5.4* 5.6* 5.8* 6.0*  ALBUMIN 2.5* 2.4* 2.3* 2.3*    Assessment and Plan: Patient with history of base of tongue cancer and recent Port-A-Cath and gastrostomy tube placements on 11/05/15; now with persistent fevers, increasing abdominal pain as well as leaking around gastrostomy tube insertion  site. Recent CT abdomen/ pelvis has revealed a large abscess noted along the anterior aspect of the liver, adjacent to the body and antrum of the stomach, containing fluid and air with a peripheral rind. Request now received for CT guided drainage of the above abdominal abscess. Imaging studies have been reviewed by Dr. Deanne Coffer.Risks and benefits discussed with the patient including bleeding, infection, damage to adjacent structures, bowel perforation/fistula connection, and sepsis. All of the patient's questions were answered, patient is agreeable to proceed. Consent signed and in chart. Procedure scheduled for later today.     Electronically Signed: D. Jeananne Rama 11/25/2015, 9:18 AM   I spent a total of 15 minutes at the the patient's bedside AND on the patient's hospital floor or unit, greater than 50% of which was counseling/coordinating care for CT-guided upper abdominal abscess drainage

## 2015-11-25 NOTE — Progress Notes (Signed)
IR notified re: IR eval  Order.

## 2015-11-25 NOTE — Progress Notes (Signed)
Attempted to call IR at this time per Dr. Rae Roam response. Will call back later.

## 2015-11-25 NOTE — Progress Notes (Signed)
CRITICAL VALUE ALERT  Critical value received: K+ 2.7  Date of notification: 11/25/15  Time of notification:  0655  Critical value read back:Yes  Nurse who received alert:  Azzie Glatter, RN MD notified (1st page):  Alen Blew  Time of first page:  [phone]  MD notified (2nd page): n/a  Time of second page:n/a  Responding RS:[id number]  Time MD responded: [phone]

## 2015-11-25 NOTE — Progress Notes (Signed)
Patient from CT,peritoneal drain intact to R side draining through gravity, noted brownish drainage. Patient denies pain.Will continue to monitor.

## 2015-11-26 ENCOUNTER — Encounter: Payer: Self-pay | Admitting: Radiation Oncology

## 2015-11-26 ENCOUNTER — Ambulatory Visit: Payer: Self-pay

## 2015-11-26 ENCOUNTER — Telehealth: Payer: Self-pay

## 2015-11-26 ENCOUNTER — Ambulatory Visit: Payer: 59

## 2015-11-26 ENCOUNTER — Ambulatory Visit
Admit: 2015-11-26 | Discharge: 2015-11-26 | Disposition: A | Discharge status: Home / Self Care | Payer: 59 | Attending: Radiation Oncology | Admitting: Radiation Oncology

## 2015-11-26 DIAGNOSIS — K2970 Gastritis, unspecified, without bleeding: Secondary | ICD-10-CM

## 2015-11-26 DIAGNOSIS — E43 Unspecified severe protein-calorie malnutrition: Secondary | ICD-10-CM

## 2015-11-26 LAB — BASIC METABOLIC PANEL
ANION GAP: 8 (ref 5–15)
BUN: 17 mg/dL (ref 6–20)
CHLORIDE: 101 mmol/L (ref 101–111)
CO2: 26 mmol/L (ref 22–32)
CREATININE: 0.74 mg/dL (ref 0.44–1.00)
Calcium: 8.4 mg/dL — ABNORMAL LOW (ref 8.9–10.3)
GFR calc non Af Amer: >60 mL/min (ref >60)
Glucose, Bld: 90 mg/dL (ref 65–99)
POTASSIUM: 3 mmol/L — AB (ref 3.5–5.1)
SODIUM: 135 mmol/L (ref 135–145)

## 2015-11-26 LAB — TYPE AND SCREEN
ABO/RH(D): O POS
ANTIBODY SCREEN: NEGATIVE
UNIT DIVISION: 0
UNIT DIVISION: 0

## 2015-11-26 LAB — CBC WITH DIFFERENTIAL/PLATELET
BASOS ABS: 0 10*3/uL (ref 0.0–0.1)
Basophils Relative: 0 %
EOS PCT: 0 %
Eosinophils Absolute: 0 10*3/uL (ref 0.0–0.7)
HCT: 27.4 % — ABNORMAL LOW (ref 36.0–46.0)
HEMOGLOBIN: 9.2 g/dL — AB (ref 12.0–15.0)
LYMPHS PCT: 9 %
Lymphs Abs: 0.6 10*3/uL — ABNORMAL LOW (ref 0.7–4.0)
MCH: 22.4 pg — ABNORMAL LOW (ref 26.0–34.0)
MCHC: 33.6 g/dL (ref 30.0–36.0)
MCV: 66.8 fL — ABNORMAL LOW (ref 78.0–100.0)
MONOS PCT: 11 %
Monocytes Absolute: 0.7 10*3/uL (ref 0.1–1.0)
Neutro Abs: 5.2 10*3/uL (ref 1.7–7.7)
Neutrophils Relative %: 80 %
Platelets: 248 10*3/uL (ref 150–400)
RBC: 4.1 MIL/uL (ref 3.87–5.11)
RDW: 22.1 % — ABNORMAL HIGH (ref 11.5–15.5)
WBC: 6.5 10*3/uL (ref 4.0–10.5)

## 2015-11-26 LAB — MAGNESIUM: MAGNESIUM: 1.7 mg/dL (ref 1.7–2.4)

## 2015-11-26 MED ORDER — SODIUM CHLORIDE 0.9% FLUSH: 10.0000 mL | INTRAVENOUS | Status: DC | PRN

## 2015-11-26 MED ORDER — POTASSIUM CHLORIDE 10 MEQ/50ML IV SOLN
INTRAVENOUS | Status: AC
  Administered 2015-11-26: 10 meq
  Filled 2015-11-26: qty 50

## 2015-11-26 MED ORDER — SODIUM CHLORIDE 0.9% FLUSH
10.0000 mL | Freq: Two times a day (BID) | INTRAVENOUS | Status: DC
Start: 2015-11-26 — End: 2015-11-30
  Administered 2015-11-29: 10 mL

## 2015-11-26 NOTE — Progress Notes (Signed)
Debra Farrell   DOB:1945-11-11   QI#:[id number]    Subjective: Hospital day 14. She is doing well. Denies nausea or vomiting. No further fevers or chills. She tolerated tube feeds well. She denies pain. She noticed mild leak around the feeding tube site  Objective:  Filed Vitals:   11/26/15 0018 11/26/15 0544  BP: 150/50 137/69  Pulse: 86 84  Temp: 99.3 F (37.4 C) 98.6 F (37 C)  Resp: 20 20     Intake/Output Summary (Last 24 hours) at 11/26/15 1042 Last data filed at 11/26/15 3875  Gross per 24 hour  Intake 1659.5 ml  Output   2460 ml  Net -800.5 ml    GENERAL:alert, no distress and comfortable SKIN: skin color, texture, turgor are normal, no rashes or significant lesions EYES: normal, Conjunctiva are pink and non-injected, sclera clear OROPHARYNX:no exudate, no erythema and lips, buccal mucosa, and tongue normal  NECK: supple, thyroid normal size, non-tender, without nodularity LYMPH:  She has persistent palpable lymphadenopathy on her neck  LUNGS: clear to auscultation and percussion with normal breathing effort HEART: regular rate & rhythm and no murmurs and no lower extremity edema ABDOMEN:abdomen soft, non-tender and normal bowel sounds. Noted drain in situ. Feeding tube site looks okay with mild erythema around it Musculoskeletal:no cyanosis of digits and no clubbing  NEURO: alert & oriented x 3 with fluent speech, no focal motor/sensory deficits   Labs:  Lab Results  Component Value Date   WBC 6.5 11/26/2015   HGB 9.2* 11/26/2015   HCT 27.4* 11/26/2015   MCV 66.8* 11/26/2015   PLT 248 11/26/2015   NEUTROABS 5.2 11/26/2015    Lab Results  Component Value Date   NA 135 11/26/2015   K 3.0* 11/26/2015   CL 101 11/26/2015   CO2 26 11/26/2015    Studies:  Ct Abdomen Pelvis W Contrast  11/24/2015  CLINICAL DATA:  Acute onset of fever and leukopenia. Leak about the G-tube. Assess for abscess. Initial encounter. EXAM: CT ABDOMEN AND PELVIS WITH CONTRAST  TECHNIQUE: Multidetector CT imaging of the abdomen and pelvis was performed using the standard protocol following bolus administration of intravenous contrast. CONTRAST:  50mL OMNIPAQUE IOHEXOL 300 MG/ML SOLN, OMNIPAQUE IOHEXOL 300 MG/ML SOLN COMPARISON:  PET/CT performed 10/14/2015, and renal ultrasound performed 11/17/2015 FINDINGS: Small bilateral pleural effusions are noted, with bibasilar opacities likely reflecting atelectasis. There is a large collection of fluid and air noted tracking along the anterior aspect of the liver, measuring approximately 12.4 x 11.0 x 4.8 cm, with a relatively thin peripheral rind, compatible with an abscess. This rise adjacent to the body and antrum of the stomach, with mild surrounding soft tissue inflammation tracking inferiorly along the omentum. The patient's G-tube is noted within the stomach. The stomach is largely decompressed. Stones are noted within the gallbladder. The gallbladder is otherwise unremarkable. The pancreas and adrenal glands are grossly unremarkable. A 1.0 cm cyst is noted at the interpole region of the right kidney. The kidneys are otherwise unremarkable. There is no evidence of hydronephrosis. No renal or ureteral stones are seen. Minimal nonspecific perinephric stranding is noted bilaterally. No free fluid is identified. The small bowel is unremarkable in appearance. No acute vascular abnormalities are seen. Minimal calcification is noted along the abdominal aorta and its branches. The appendix is normal in caliber and contains contrast, without evidence of appendicitis. Contrast progresses to the level of the rectum. The colon is unremarkable in appearance. Apparent mild wall thickening at the rectum is  thought to be transient in nature. The bladder is mildly distended and grossly unremarkable. The patient is status post hysterectomy. No suspicious adnexal masses are seen. The ovaries are relatively symmetric. Scattered postoperative change is  noted within the pelvis. No inguinal lymphadenopathy is seen. No acute osseous abnormalities are identified. IMPRESSION: 1. Large abscess noted along the anterior aspect of the liver, adjacent to the body and antrum of the stomach, measuring 12.4 x 11.0 x 4.8 cm, containing fluid and air, with a peripheral rind. Mild surrounding soft tissue inflammation tracks about the antrum of the stomach and inferiorly along the omentum. 2. Small bilateral pleural effusions, with bibasilar airspace opacities likely reflecting atelectasis. 3. G-tube remains within the stomach. The stomach is largely decompressed. 4. Cholelithiasis.  Gallbladder otherwise unremarkable. 5. Small right renal cyst seen. These results were called by telephone at the time of interpretation on 11/24/2015 at 10:14 pm to [facility], who verbally acknowledged these results. Electronically Signed   By: Roanna Raider M.D.   On: 11/24/2015 22:15   Ct Image Guided Drainage By Percutaneous Catheter  11/25/2015  CLINICAL DATA:  Peritoneal abscess post gastrostomy tube placement EXAM: CT GUIDED DRAINAGE OF PERITONEAL ABSCESS ANESTHESIA/SEDATION: Intravenous Fentanyl and Versed were administered as conscious sedation during continuous monitoring of the patient's level of consciousness and physiological / cardiorespiratory status by the radiology RN, with a total moderate sedation time of 12 minutes. PROCEDURE: The procedure, risks, benefits, and alternatives were explained to the patient. Questions regarding the procedure were encouraged and answered. The patient understands and consents to the procedure. Select axial scans through the mid abdomen obtained. The collection was localized an appropriate skin entry site determined and marked. The operative field was prepped with chlorhexidinein a sterile fashion, and a sterile drape was applied covering the operative field. A sterile gown and sterile gloves were used for the procedure. Local  anesthesia was provided with 1% Lidocaine. Under CT fluoroscopic guidance, a 19 gauge percutaneous entry needle was advanced into the collection. Thin purulent material could be aspirated. Amplatz wire advanced easily within the collection, position confirmed on CT. Tract dilated to the to facilitate placement of a 12 French pigtail catheter, placed within the central dependent aspect of the collection. Approximately 40 mL of thin purulent fluid were aspirated, a sample sent for Gram stain and culture. The catheter was secured externally with 0 Prolene suture and StatLock and placed to gravity bag. The patient tolerated the procedure well. COMPLICATIONS: None immediate FINDINGS: CT confirms a loculated gas and fluid collection anterior to the left lobe of the liver in the midline. Abscess drain catheter placed under CT fluoroscopic guidance as above. IMPRESSION: 1. Technically successful CT-guided peritoneal abscess drain catheter placement. Electronically Signed   By: Corlis Leak M.D.   On: 11/25/2015 16:32    Assessment & Plan:   Cancer of base of tongue (HCC) Unfortunately, she developed intractable nausea and vomiting, multifactorial, could be related to side effects of chemotherapy, compounded by severe gastritis and recent constipation Continue supportive care Today, she is mildly neutropenic. I will defer to radiation oncologist to determine the plan regarding radiation treatment She may not be able to tolerate further chemotherapy due to multiple complications  Intractable nausea and vomiting, improving This was related to side effects of treatment  Continue scopolamine patch, continue PO Reglan and IV Zofran to prn  Gastritis, acute She has severe acute gastritis, exacerbating her nausea and vomiting. Continue protonix On GI cocktail prn  Hyponatremia, resolved Acute renal failure, resolved Due to dehydration and recent cisplatin Continue IVF resuscitation Monitor daily Renal  ultrasound is OK, uric acid OK  Hypomagnesemia, resolved Hypokalemia Renal wasting from cisplatin, poor oral intake and side effects of cefepime Replacement therapy is initiated We'll monitor closely  Moderate to severe protein calorie malnutriton Poor oral intake, improving Consult nutritionist Advancing to regular diet and intermittent bolus feeding  Leak around feeding tube Large intra-abdominal abscess s/p CT guided drain placement on 11/25/15 She has successful placement of drainage tube to the fluid collection/abscess. Fever had resolved. Cultures are pending. I have consulted infectious disease consultant to review her antibiotic therapy and to tailor her regimen and to comment on duration of antibiotics.  Essential Hypertension Was on HCTZ at home/baseline. Due to dehydration, this is stopped Started on amlodipine, added hydralazine  Pain at surgical incision, resolved Her pain has improved with IV Dilaudid. Discontinue pain medicine due to recent falls  Thalassemia, acute on chronic anemia Received blood transfusion on 11/15/15 and 11/25/15 due to bone marrow suppression  Neutropenic fever, resolved This is related to ongoing treatment and intra-abdominal abscess She received Granix x 1 on 11/25/15, with resolution of neutropenia I will consult infectious disease to determine the duration of antibiotic therapy  DVT prophylaxis On Lovenox  Discharge planning  Hopefully, she can be discharged tomorrow if everything goes well without further fever  Tanisa Lagace, MD 11/26/2015  10:42 AM

## 2015-11-26 NOTE — Progress Notes (Signed)
Nutrition Follow-up  DOCUMENTATION CODES:   Severe malnutrition in context of acute illness/injury  INTERVENTION:   -Continue to monitor Mg, Phos, K levels for refeeding. -Provided Home Tube Feeding booklet, provided needs, tube feeding formula and regimen. Answered all questions.  Continue TF regimen: 1 can Vital AF 1.2 every 4 hours (0700, 1100, 1500, 1900) Recommend water flushes of 30 ml before and after each bolus. 30 ml Prostat TID Tube feeding regimen to provide 1436 kcal (68% of needs), 117g protein (100% of needs) and 1008 ml H2O.  If patient is not receiving IVF, recommend free water flushes of 240 ml every 6 hours.  RD to continue to monitor  NUTRITION DIAGNOSIS:   Malnutrition related to acute illness as evidenced by energy intake < or equal to 50% for > or equal to 5 days, severe depletion of body fat.  Ongoing.  GOAL:   Patient will meet greater than or equal to 90% of their needs  Progressing.  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 She is followed by Dr. Alvy Bimler and a request has been made for a [facility] for chemo and a g-tube. Of note, she does have thyroid nodules as well that were biopsied by Dr. Anselm Pancoast yesterday.  Tube feeds have been resumed for today. Pt has had 2 boluses today so far and is tolerating them with no issue.  RD provided patient with Home Tube Feeding booklet with estimated needs, tube feeding formula and regimen. All questions were answered. Encouraged pt to follow-up with [place] RD after discharge.  Pt with little PO intake, ordered popsicles and [place] ices (provides ~155 kcal). Tried chocolate almond milk but did not like it. Encouraged her to try and increase PO intake to take in remaining 25% of kcal needs.  RD to continue to monitor daily while patient is here. Discharge possible for  tomorrow.  Medications: folic acid tablet daily, Mag-Ox tablet BID, Reglan QID  Labs reviewed: Low K Mg/Phos WNL  Diet Order:  Diet regular Room service appropriate?: Yes; Fluid consistency:: Thin  Skin:  Wound (see comment) (Lip wound)  Last BM:  3/1  Height:   Ht Readings from Last 1 Encounters:  11/13/15 '5\' 6"'$  (1.676 m)    Weight:   Wt Readings from Last 1 Encounters:  11/26/15 164 lb (74.39 kg)    Ideal Body Weight:  59.1 kg  BMI:  Body mass index is 26.48 kg/(m^2).  Estimated Nutritional Needs:   Kcal:  2100-2300  Protein:  110-120g  Fluid:  2.1L/day  EDUCATION NEEDS:   Education needs addressed  Clayton Bibles, MS, RD, LDN Pager: [phone] After Hours Pager: [phone]

## 2015-11-26 NOTE — Progress Notes (Signed)
Patient ID: Debra Farrell, female   DOB: [birth year], 70 y.o.   MRN: [id number]    Referring Physician(s): Bertis Ruddy  Supervising Physician: Irish Lack  Chief Complaint: Abdominal abscess  Subjective: Doing well today.  No new complaints  Allergies: Morphine and related  Medications: Prior to Admission medications   Medication Sig Start Date End Date Taking? Authorizing Provider  acetaminophen-codeine (TYLENOL #3) 300-30 MG tablet Take 1 tablet by mouth every 6 (six) hours as needed. pain 10/30/15  Yes Historical Provider, MD  dexamethasone (DECADRON) 4 MG tablet Take 2 tablets by mouth once a day on the day after chemotherapy and then take 2 tablets two times a day for 2 days. Take with food. 11/10/15  Yes Artis Delay, MD  hydrochlorothiazide (HYDRODIURIL) 25 MG tablet Take 25 mg by mouth daily.   Yes Historical Provider, MD  HYDROcodone-acetaminophen (NORCO/VICODIN) 5-325 MG tablet Take 1 tablet by mouth every 6 (six) hours as needed. pain 11/03/15  Yes Historical Provider, MD  ibuprofen (ADVIL,MOTRIN) 200 MG tablet Take 400 mg by mouth every 6 (six) hours as needed for moderate pain.   Yes Historical Provider, MD  lidocaine-prilocaine (EMLA) cream Apply to affected area once Patient taking differently: Apply 1 application topically daily as needed (port access). Apply to affected area once 11/10/15  Yes Artis Delay, MD  oxyCODONE-acetaminophen (PERCOCET) 10-325 MG tablet Take 1 tablet by mouth every 4 (four) hours as needed for pain. 11/06/15  Yes Artis Delay, MD  prochlorperazine (COMPAZINE) 10 MG tablet Take 1 tablet (10 mg total) by mouth every 6 (six) hours as needed (Nausea or vomiting). 11/10/15  Yes Artis Delay, MD  scopolamine (TRANSDERM-SCOP) 1 MG/3DAYS Place 1 patch (1.5 mg total) onto the skin every 3 (three) days. 11/13/15  Yes Artis Delay, MD  ondansetron (ZOFRAN) 8 MG tablet Take 1 tablet (8 mg total) by mouth 2 (two) times daily as needed. Start on the third day after  chemotherapy. Patient not taking: Reported on 11/13/2015 11/10/15   Artis Delay, MD    Vital Signs: BP 137/69 mmHg  Pulse 84  Temp(Src) 98.6 F (37 C) (Oral)  Resp 20  Ht 5\' 6"  (1.676 m)  Wt 164 lb (74.39 kg)  BMI 26.48 kg/m2  SpO2 95%  Physical Exam: Abd: soft, NT, ND, drain in place in central abdomen with tan output.  Site is c/d/i.  g-tube is in place with flange flush to her skin.  No evidence of leakage   Imaging: Dg Chest 2 View  11/22/2015  CLINICAL DATA:  Dry cough for 2 days EXAM: CHEST  2 VIEW COMPARISON:  10/14/2015 FINDINGS: Cardiac shadow is within normal limits. Right chest wall port is noted in satisfactory position. Bibasilar infiltrative changes are noted right greater than left. Small effusions are seen as well. No bony abnormality is noted. IMPRESSION: Bibasilar infiltrates with small effusions. Electronically Signed   By: Alcide Clever M.D.   On: 11/22/2015 17:26   Ct Abdomen Pelvis W Contrast  11/24/2015  CLINICAL DATA:  Acute onset of fever and leukopenia. Leak about the G-tube. Assess for abscess. Initial encounter. EXAM: CT ABDOMEN AND PELVIS WITH CONTRAST TECHNIQUE: Multidetector CT imaging of the abdomen and pelvis was performed using the standard protocol following bolus administration of intravenous contrast. CONTRAST:  50mL OMNIPAQUE IOHEXOL 300 MG/ML SOLN, OMNIPAQUE IOHEXOL 300 MG/ML SOLN COMPARISON:  PET/CT performed 10/14/2015, and renal ultrasound performed 11/17/2015 FINDINGS: Small bilateral pleural effusions are noted, with bibasilar opacities likely reflecting atelectasis. There is a  large collection of fluid and air noted tracking along the anterior aspect of the liver, measuring approximately 12.4 x 11.0 x 4.8 cm, with a relatively thin peripheral rind, compatible with an abscess. This rise adjacent to the body and antrum of the stomach, with mild surrounding soft tissue inflammation tracking inferiorly along the omentum. The patient's G-tube is noted  within the stomach. The stomach is largely decompressed. Stones are noted within the gallbladder. The gallbladder is otherwise unremarkable. The pancreas and adrenal glands are grossly unremarkable. A 1.0 cm cyst is noted at the interpole region of the right kidney. The kidneys are otherwise unremarkable. There is no evidence of hydronephrosis. No renal or ureteral stones are seen. Minimal nonspecific perinephric stranding is noted bilaterally. No free fluid is identified. The small bowel is unremarkable in appearance. No acute vascular abnormalities are seen. Minimal calcification is noted along the abdominal aorta and its branches. The appendix is normal in caliber and contains contrast, without evidence of appendicitis. Contrast progresses to the level of the rectum. The colon is unremarkable in appearance. Apparent mild wall thickening at the rectum is thought to be transient in nature. The bladder is mildly distended and grossly unremarkable. The patient is status post hysterectomy. No suspicious adnexal masses are seen. The ovaries are relatively symmetric. Scattered postoperative change is noted within the pelvis. No inguinal lymphadenopathy is seen. No acute osseous abnormalities are identified. IMPRESSION: 1. Large abscess noted along the anterior aspect of the liver, adjacent to the body and antrum of the stomach, measuring 12.4 x 11.0 x 4.8 cm, containing fluid and air, with a peripheral rind. Mild surrounding soft tissue inflammation tracks about the antrum of the stomach and inferiorly along the omentum. 2. Small bilateral pleural effusions, with bibasilar airspace opacities likely reflecting atelectasis. 3. G-tube remains within the stomach. The stomach is largely decompressed. 4. Cholelithiasis.  Gallbladder otherwise unremarkable. 5. Small right renal cyst seen. These results were called by telephone at the time of interpretation on 11/24/2015 at 10:14 pm to [facility] at [facility], who verbally  acknowledged these results. Electronically Signed   By: Roanna Raider M.D.   On: 11/24/2015 22:15   Ir Cm Inj Any Colonic Tube W/fluoro  11/23/2015  INDICATION: Percutaneous gastrostomy tube placed 11/05/2015. Concern for possible leak. EXAM: GI TUBE INJECTION MEDICATIONS: None; Antibiotics were administered within 1 hour of the procedure. Glucagon 1 mg IV ANESTHESIA/SEDATION: Moderate Sedation Time:  None The patient was continuously monitored during the procedure by the interventional radiology nurse under my direct supervision. CONTRAST:  10 mL Omnipaque 300 - administered into the gastric lumen. FLUOROSCOPY TIME:  Fluoroscopy Time: 6 seconds COMPLICATIONS: None immediate. PROCEDURE: Informed written consent was obtained from the patient after a thorough discussion of the procedural risks, benefits and alternatives. All questions were addressed. Maximal Sterile Barrier Technique was utilized including caps, mask, sterile gowns, sterile gloves, sterile drape, hand hygiene and skin antiseptic. A timeout was performed prior to the initiation of the procedure. Gastrostomy tube was inspected and found to be without obvious defects. The tube was then injected with approximately 10 cc of contrast which promptly filled the gastric lumen. Several images were taken confirming no evidence of leak or obstruction. Gastrostomy tube is in proper position. IMPRESSION: Properly position the gastrostomy tube without evidence of leak. Read by: Brayton El PA-C Electronically Signed   By: Jolaine Click M.D.   On: 11/23/2015 13:00   Ct Image Guided Drainage By Percutaneous Catheter  11/25/2015  CLINICAL  DATA:  Peritoneal abscess post gastrostomy tube placement EXAM: CT GUIDED DRAINAGE OF PERITONEAL ABSCESS ANESTHESIA/SEDATION: Intravenous Fentanyl and Versed were administered as conscious sedation during continuous monitoring of the patient's level of consciousness and physiological / cardiorespiratory status by the radiology  RN, with a total moderate sedation time of 12 minutes. PROCEDURE: The procedure, risks, benefits, and alternatives were explained to the patient. Questions regarding the procedure were encouraged and answered. The patient understands and consents to the procedure. Select axial scans through the mid abdomen obtained. The collection was localized an appropriate skin entry site determined and marked. The operative field was prepped with chlorhexidinein a sterile fashion, and a sterile drape was applied covering the operative field. A sterile gown and sterile gloves were used for the procedure. Local anesthesia was provided with 1% Lidocaine. Under CT fluoroscopic guidance, a 19 gauge percutaneous entry needle was advanced into the collection. Thin purulent material could be aspirated. Amplatz wire advanced easily within the collection, position confirmed on CT. Tract dilated to the to facilitate placement of a 12 French pigtail catheter, placed within the central dependent aspect of the collection. Approximately 40 mL of thin purulent fluid were aspirated, a sample sent for Gram stain and culture. The catheter was secured externally with 0 Prolene suture and StatLock and placed to gravity bag. The patient tolerated the procedure well. COMPLICATIONS: None immediate FINDINGS: CT confirms a loculated gas and fluid collection anterior to the left lobe of the liver in the midline. Abscess drain catheter placed under CT fluoroscopic guidance as above. IMPRESSION: 1. Technically successful CT-guided peritoneal abscess drain catheter placement. Electronically Signed   By: Corlis Leak M.D.   On: 11/25/2015 16:32    Labs:  CBC:  Recent Labs  11/24/15 0545 11/24/15 1622 11/25/15 0520 11/26/15 0530  WBC 2.8* 2.0* 1.9* 6.5  HGB 7.7* 7.3* 7.3* 9.2*  HCT 23.9* 22.7* 22.8* 27.4*  PLT 204 204 257 248    COAGS:  Recent Labs  10/27/15 1000 11/05/15 1212  INR 1.08 1.18  APTT  --  35    BMP:  Recent Labs   11/23/15 0622 11/24/15 0545 11/25/15 0520 11/25/15 1500 11/26/15 0530  NA 136 135 130*  --  135  K 4.1 3.6 2.7* 2.8* 3.0*  CL 101 101 96*  --  101  CO2 26 25 25   --  26  GLUCOSE 99 104* 110*  --  90  BUN 19 20 20   --  17  CALCIUM 8.4* 8.4* 8.0*  --  8.4*  CREATININE 0.90 0.88 0.90  --  0.74  GFRNONAA >60 >60 >60  --  >60  GFRAA >60 >60 >60  --  >60    LIVER FUNCTION TESTS:  Recent Labs  11/21/15 0430 11/22/15 0623 11/23/15 0622 11/24/15 0545  BILITOT 0.8 1.1 1.4* 1.4*  AST 25 31 34 33  ALT 24 31 30 31   ALKPHOS 89 108 91 88  PROT 5.4* 5.6* 5.8* 6.0*  ALBUMIN 2.5* 2.4* 2.3* 2.3*    Assessment and Plan: Abdominal abscess -s/p perc drain on 3/1, [place] -Cx abundant G(+) rods -cont drain irrigation -she can follow up in drain clinic at our office for repeat CT scan and evaluation of her drain.  Electronically Signed: Letha Cape 11/26/2015, 11:00 AM   I spent a total of 15 Minutes at the the patient's bedside AND on the patient's hospital floor or unit, greater than 50% of which was counseling/coordinating care for abdominal abscess, s/p perc drain

## 2015-11-26 NOTE — Telephone Encounter (Signed)
I spoke with Ms. Rochel Brome nurse on [facility]. Ms. Epler is able to come to radiation treatment today at 3:00. I will call the machine to let them know that she will be coming.

## 2015-11-26 NOTE — Consult Note (Addendum)
[facility] for Infectious Disease  Date of Admission:  11/13/2015  Date of Consult:  11/26/2015  Reason for Consult: fever, abscess Referring Physician: Alvy Bimler  Impression/Recommendation Intra-abdominal Abscess Cx g/s shows GPR Would continue cefepime while we await her Cx final. Could consider adding anaerobic coverage if Cx (-) Add vancomycin BCx are negative Suspect will need long anbx (at least 3 weeks) Will determine po vs IV once we have organism and sensi  Tongue Ca Further therapy per Dr Alvy Bimler  Neutropenia Improved with GCSF ANC now 5200  Sever protein calorie malnutrition Has nutrition f/u  Thank you so much for this interesting consult,   Bobby Rumpf (pager) [phone] [email or web address]  Debra Farrell is an 70 y.o. female.  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 Dexamethasone 2-16/17 Her hx is also notable for port and peg placement on 2-9. Her course was complicated by persistent abd pain.  She comes to hospital on 2-17 with n/v that was uncontrolled with zofran and needing IV hydration. Her course was complicated by AKI (suspected due to cisplatinin, dehydration). By 2-22 she developed fever to 101.1. She was noted to have mucositis and was started on nystatin. By 2-25 her temp had gone to 102.5.  By -26 her Cr had normalized.  She was noted on 2-27 to have leakage around her PEG tube, she was started on levaquin. He fever persisted and she was changed to cefepime on 2-28.  She had CT scan on 2-28 which showed large abscess adjacent to stomach, liver. Her ANC was 800 on 2-28. She underwent CT guided drain on 3-1.  She was started on GCSF on 3-1.   Past Medical History  Diagnosis Date  . Hypertension   . Hyperlipidemia   . Eczema   . Thalassemia minor   . Cancer of base of tongue ([place]) 10/14/2015  . Laceration  04/2014    around R ear, for falling out of bed & hitting nightstand  . Intractable nausea and vomiting 11/13/2015    Past Surgical History  Procedure Laterality Date  . Abdominal hysterectomy  1990    partial  . Incontinence surgery  1990, [id number]  . Rectocele repair    . Urocele      correction surgery  . Tonsillectomy      as a child  . Direct laryngoscopy N/A 10/28/2015    Procedure: DIRECT LARYNGOSCOPY WITH BIOPSY;  Surgeon: [name], MD;  Location: [place];  Service: ENT;  Laterality: N/A;  . Esophagoscopy N/A 10/28/2015    Procedure: ESOPHAGOSCOPY;  Surgeon: [name], MD;  Location: [place];  Service: ENT;  Laterality: N/A;  . Laryngoscopy and bronchoscopy N/A 10/28/2015    Procedure: BRONCHOSCOPY;  Surgeon: [name], MD;  Location: [place];  Service: ENT;  Laterality: N/A;  . Multiple extractions with alveoloplasty N/A 10/28/2015    Procedure: Extraction of tooth #'s 2-12, 14,15,17,18,20-29, 31 with alveoloplasy and mandibular left torus reduction;  Surgeon: Lenn Cal, DDS;  Location: [place];  Service: Oral Surgery;  Laterality: N/A;     Allergies  Allergen Reactions  . Morphine And Related Nausea And Vomiting    Medications:  Scheduled: . amLODipine  5 mg Per Tube BID  . ceFEPime (MAXIPIME) IV  2 g Intravenous Q8H  . chlorhexidine  15 mL Mouth Rinse BID  .  enoxaparin (LOVENOX) injection  40 mg Subcutaneous Q24H  . feeding supplement (PRO-STAT SUGAR FREE 64)  30 mL Per Tube TID  . feeding supplement (VITAL AF 1.2 CAL)  237 mL Per Tube QID  . folic acid  2 mg Oral Daily  . hydrALAZINE  10 mg Oral 3 times per day  . magnesium oxide  400 mg Oral BID  . metoCLOPramide  5 mg Oral TID AC & HS  . pantoprazole sodium  40 mg Per Tube Daily  . potassium chloride  40 mEq Oral BID  . scopolamine  1 patch Transdermal Q72H    Abtx:  Anti-infectives    Start     Dose/Rate Route Frequency Ordered Stop   11/25/15 0100  ceFEPIme (MAXIPIME) 2 g in dextrose 5 % 50 mL  IVPB     2 g 100 mL/hr over 30 Minutes Intravenous Every 8 hours 11/24/15 1604     11/24/15 1615  ceFEPIme (MAXIPIME) 2 g in dextrose 5 % 50 mL IVPB     2 g 100 mL/hr over 30 Minutes Intravenous STAT 11/24/15 1603 11/24/15 1707   11/23/15 0830  levofloxacin (LEVAQUIN) IVPB 750 mg  Status:  Discontinued     750 mg 100 mL/hr over 90 Minutes Intravenous Every 24 hours 11/23/15 0807 11/24/15 1559      Total days of antibiotics: 4 cefepime          Social History:  reports that she has never smoked. She has never used smokeless tobacco. She reports that she does not drink alcohol or use illicit drugs.  Family History  Problem Relation Age of Onset  . Cancer Mother     lung  . Asthma Father     General ROS: occas dry cough, no further nausea, taking po, mild loose BM, no dysurai, see HPI.   Blood pressure 137/69, pulse 84, temperature 98.6 F (37 C), temperature source Oral, resp. rate 20, height '5\' 6"'  (1.676 m), weight 74.39 kg (164 lb), SpO2 95 %. General appearance: alert, cooperative and no distress Eyes: negative findings: conjunctivae and sclerae normal and pupils equal, round, reactive to light and accomodation Throat: normal findings: buccal mucosa normal and oropharynx pink & moist without lesions or evidence of thrush Neck: lg palpable mass on R neck. fixed.  Lungs: clear to auscultation bilaterally and R chest port is clean, no erythema, non-tender, no fluctuance.  Heart: regular rate and rhythm Abdomen: normal findings: bowel sounds normal and soft, non-tender. Drain has cloudy yellow drainage present in it. Dressing clean.  Extremities: edema none   Results for orders placed or performed during the hospital encounter of 11/13/15 (from the past 48 hour(s))  CBC with Differential/Platelet     Status: Abnormal   Collection Time: 11/24/15  4:22 PM  Result Value Ref Range   WBC 2.0 (L) 4.0 - 10.5 K/uL   RBC 3.52 (L) 3.87 - 5.11 MIL/uL   Hemoglobin 7.3 (L) 12.0 - 15.0  g/dL   HCT 22.7 (L) 36.0 - 46.0 %   MCV 64.5 (L) 78.0 - 100.0 fL   MCH 20.7 (L) 26.0 - 34.0 pg   MCHC 32.2 30.0 - 36.0 g/dL   RDW 19.8 (H) 11.5 - 15.5 %   Platelets 204 150 - 400 K/uL   Neutrophils Relative % 37 %   Lymphocytes Relative 32 %   Monocytes Relative 28 %   Eosinophils Relative 1 %   Basophils Relative 2 %   Neutro Abs 0.8 (L) 1.7 -  7.7 K/uL   Lymphs Abs 0.6 (L) 0.7 - 4.0 K/uL   Monocytes Absolute 0.6 0.1 - 1.0 K/uL   Eosinophils Absolute 0.0 0.0 - 0.7 K/uL   Basophils Absolute 0.0 0.0 - 0.1 K/uL   RBC Morphology ELLIPTOCYTES     Comment: TARGET CELLS  Culture, blood (x 2)     Status: None (Preliminary result)   Collection Time: 11/24/15  4:22 PM  Result Value Ref Range   Specimen Description BLOOD RIGHT ARM    Special Requests BOTTLES DRAWN AEROBIC AND ANAEROBIC  10CC    Culture      NO GROWTH 2 DAYS Performed at [facility]    Report Status PENDING   Culture, blood (routine x 2)     Status: None (Preliminary result)   Collection Time: 11/24/15  4:30 PM  Result Value Ref Range   Specimen Description BLOOD LEFT ARM    Special Requests BOTTLES DRAWN AEROBIC AND ANAEROBIC  10CC    Culture      NO GROWTH 2 DAYS Performed at [facility]    Report Status PENDING   Urinalysis, Routine w reflex microscopic (not at [facility])     Status: None   Collection Time: 11/24/15  6:28 PM  Result Value Ref Range   Color, Urine YELLOW YELLOW   APPearance CLEAR CLEAR   Specific Gravity, Urine 1.011 1.005 - 1.030   pH 7.0 5.0 - 8.0   Glucose, UA NEGATIVE NEGATIVE mg/dL   Hgb urine dipstick NEGATIVE NEGATIVE   Bilirubin Urine NEGATIVE NEGATIVE   Ketones, ur NEGATIVE NEGATIVE mg/dL   Protein, ur NEGATIVE NEGATIVE mg/dL   Nitrite NEGATIVE NEGATIVE   Leukocytes, UA NEGATIVE NEGATIVE    Comment: MICROSCOPIC NOT DONE ON URINES WITH NEGATIVE PROTEIN, BLOOD, LEUKOCYTES, NITRITE, OR GLUCOSE <1000 mg/dL.  CBC with Differential/Platelet     Status: Abnormal   Collection  Time: 11/25/15  5:20 AM  Result Value Ref Range   WBC 1.9 (L) 4.0 - 10.5 K/uL   RBC 3.55 (L) 3.87 - 5.11 MIL/uL   Hemoglobin 7.3 (L) 12.0 - 15.0 g/dL   HCT 22.8 (L) 36.0 - 46.0 %   MCV 64.2 (L) 78.0 - 100.0 fL   MCH 20.6 (L) 26.0 - 34.0 pg   MCHC 32.0 30.0 - 36.0 g/dL   RDW 20.0 (H) 11.5 - 15.5 %   Platelets 257 150 - 400 K/uL   Neutrophils Relative % 41 %   Lymphocytes Relative 36 %   Monocytes Relative 21 %   Eosinophils Relative 1 %   Basophils Relative 1 %   Neutro Abs 0.8 (L) 1.7 - 7.7 K/uL   Lymphs Abs 0.7 0.7 - 4.0 K/uL   Monocytes Absolute 0.4 0.1 - 1.0 K/uL   Eosinophils Absolute 0.0 0.0 - 0.7 K/uL   Basophils Absolute 0.0 0.0 - 0.1 K/uL   RBC Morphology ELLIPTOCYTES     Comment: TARGET CELLS TEARDROP CELLS    WBC Morphology DOHLE BODIES   Basic metabolic panel     Status: Abnormal   Collection Time: 11/25/15  5:20 AM  Result Value Ref Range   Sodium 130 (L) 135 - 145 mmol/L   Potassium 2.7 (LL) 3.5 - 5.1 mmol/L    Comment: CRITICAL RESULT CALLED TO, READ BACK BY AND VERIFIED WITH: MEREDITH MILLS, RN, [phone] 11/25/15 BY A [place] DELTA CHECK NOTED REPEATED TO VERIFY    Chloride 96 (L) 101 - 111 mmol/L   CO2 25 22 - 32 mmol/L  Glucose, Bld 110 (H) 65 - 99 mg/dL   BUN 20 6 - 20 mg/dL   Creatinine, Ser 0.90 0.44 - 1.00 mg/dL   Calcium 8.0 (L) 8.9 - 10.3 mg/dL   GFR calc non Af Amer >60 >60 mL/min   GFR calc Af Amer >60 >60 mL/min    Comment: (NOTE) The eGFR has been calculated using the CKD EPI equation. This calculation has not been validated in all clinical situations. eGFR's persistently <60 mL/min signify possible Chronic Kidney Disease.    Anion gap 9 5 - 15  Magnesium     Status: Abnormal   Collection Time: 11/25/15  5:20 AM  Result Value Ref Range   Magnesium 1.2 (L) 1.7 - 2.4 mg/dL  Phosphorus     Status: None   Collection Time: 11/25/15  5:20 AM  Result Value Ref Range   Phosphorus 2.6 2.5 - 4.6 mg/dL  Prepare RBC     Status: None   Collection  Time: 11/25/15  9:10 AM  Result Value Ref Range   Order Confirmation ORDER PROCESSED BY BLOOD BANK   Type and screen [place]     Status: None   Collection Time: 11/25/15  9:11 AM  Result Value Ref Range   ABO/RH(D) O POS    Antibody Screen NEG    Sample Expiration 11/28/2015    Unit Number [id number]    Blood Component Type RBC, LR IRR    Unit division 00    Status of Unit ISSUED,FINAL    Transfusion Status OK TO TRANSFUSE    Crossmatch Result Compatible    Unit Number [id number]    Blood Component Type RBC, LR IRR    Unit division 00    Status of Unit ISSUED,FINAL    Transfusion Status OK TO TRANSFUSE    Crossmatch Result Compatible   Potassium     Status: Abnormal   Collection Time: 11/25/15  3:00 PM  Result Value Ref Range   Potassium 2.8 (L) 3.5 - 5.1 mmol/L  Culture, routine-abscess     Status: None (Preliminary result)   Collection Time: 11/25/15  3:47 PM  Result Value Ref Range   Specimen Description DRAINAGE    Special Requests Normal    Gram Stain      NO WBC SEEN NO SQUAMOUS EPITHELIAL CELLS SEEN ABUNDANT GRAM POSITIVE RODS Performed at [facility]    Culture PENDING    Report Status PENDING   CBC with Differential/Platelet     Status: Abnormal   Collection Time: 11/26/15  5:30 AM  Result Value Ref Range   WBC 6.5 4.0 - 10.5 K/uL   RBC 4.10 3.87 - 5.11 MIL/uL   Hemoglobin 9.2 (L) 12.0 - 15.0 g/dL    Comment: REPEATED TO VERIFY DELTA CHECK NOTED POST TRANSFUSION SPECIMEN    HCT 27.4 (L) 36.0 - 46.0 %   MCV 66.8 (L) 78.0 - 100.0 fL   MCH 22.4 (L) 26.0 - 34.0 pg   MCHC 33.6 30.0 - 36.0 g/dL   RDW 22.1 (H) 11.5 - 15.5 %   Platelets 248 150 - 400 K/uL   Neutrophils Relative % 80 %   Lymphocytes Relative 9 %   Monocytes Relative 11 %   Eosinophils Relative 0 %   Basophils Relative 0 %   Neutro Abs 5.2 1.7 - 7.7 K/uL   Lymphs Abs 0.6 (L) 0.7 - 4.0 K/uL   Monocytes Absolute 0.7 0.1 - 1.0 K/uL   Eosinophils Absolute  0.0  0.0 - 0.7 K/uL   Basophils Absolute 0.0 0.0 - 0.1 K/uL   RBC Morphology ELLIPTOCYTES    WBC Morphology TOXIC GRANULATION     Comment: MILD LEFT SHIFT (1-5% METAS, OCC MYELO, OCC BANDS)  Basic metabolic panel     Status: Abnormal   Collection Time: 11/26/15  5:30 AM  Result Value Ref Range   Sodium 135 135 - 145 mmol/L   Potassium 3.0 (L) 3.5 - 5.1 mmol/L   Chloride 101 101 - 111 mmol/L   CO2 26 22 - 32 mmol/L   Glucose, Bld 90 65 - 99 mg/dL   BUN 17 6 - 20 mg/dL   Creatinine, Ser 0.74 0.44 - 1.00 mg/dL   Calcium 8.4 (L) 8.9 - 10.3 mg/dL   GFR calc non Af Amer >60 >60 mL/min   GFR calc Af Amer >60 >60 mL/min    Comment: (NOTE) The eGFR has been calculated using the CKD EPI equation. This calculation has not been validated in all clinical situations. eGFR's persistently <60 mL/min signify possible Chronic Kidney Disease.    Anion gap 8 5 - 15  Magnesium     Status: None   Collection Time: 11/26/15  5:30 AM  Result Value Ref Range   Magnesium 1.7 1.7 - 2.4 mg/dL      Component Value Date/Time   SDES DRAINAGE 11/25/2015 1547   SPECREQUEST Normal 11/25/2015 1547   CULT PENDING 11/25/2015 1547   REPTSTATUS PENDING 11/25/2015 1547   Ct Abdomen Pelvis W Contrast  11/24/2015  CLINICAL DATA:  Acute onset of fever and leukopenia. Leak about the G-tube. Assess for abscess. Initial encounter. EXAM: CT ABDOMEN AND PELVIS WITH CONTRAST TECHNIQUE: Multidetector CT imaging of the abdomen and pelvis was performed using the standard protocol following bolus administration of intravenous contrast. CONTRAST:  47m OMNIPAQUE IOHEXOL 300 MG/ML SOLN, 1052mOMNIPAQUE IOHEXOL 300 MG/ML SOLN COMPARISON:  PET/CT performed 10/14/2015, and renal ultrasound performed 11/17/2015 FINDINGS: Small bilateral pleural effusions are noted, with bibasilar opacities likely reflecting atelectasis. There is a large collection of fluid and air noted tracking along the anterior aspect of the liver, measuring  approximately 12.4 x 11.0 x 4.8 cm, with a relatively thin peripheral rind, compatible with an abscess. This rise adjacent to the body and antrum of the stomach, with mild surrounding soft tissue inflammation tracking inferiorly along the omentum. The patient's G-tube is noted within the stomach. The stomach is largely decompressed. Stones are noted within the gallbladder. The gallbladder is otherwise unremarkable. The pancreas and adrenal glands are grossly unremarkable. A 1.0 cm cyst is noted at the interpole region of the right kidney. The kidneys are otherwise unremarkable. There is no evidence of hydronephrosis. No renal or ureteral stones are seen. Minimal nonspecific perinephric stranding is noted bilaterally. No free fluid is identified. The small bowel is unremarkable in appearance. No acute vascular abnormalities are seen. Minimal calcification is noted along the abdominal aorta and its branches. The appendix is normal in caliber and contains contrast, without evidence of appendicitis. Contrast progresses to the level of the rectum. The colon is unremarkable in appearance. Apparent mild wall thickening at the rectum is thought to be transient in nature. The bladder is mildly distended and grossly unremarkable. The patient is status post hysterectomy. No suspicious adnexal masses are seen. The ovaries are relatively symmetric. Scattered postoperative change is noted within the pelvis. No inguinal lymphadenopathy is seen. No acute osseous abnormalities are identified. IMPRESSION: 1. Large abscess noted along the anterior aspect of  the liver, adjacent to the body and antrum of the stomach, measuring 12.4 x 11.0 x 4.8 cm, containing fluid and air, with a peripheral rind. Mild surrounding soft tissue inflammation tracks about the antrum of the stomach and inferiorly along the omentum. 2. Small bilateral pleural effusions, with bibasilar airspace opacities likely reflecting atelectasis. 3. G-tube remains within  the stomach. The stomach is largely decompressed. 4. Cholelithiasis.  Gallbladder otherwise unremarkable. 5. Small right renal cyst seen. These results were called by telephone at the time of interpretation on 11/24/2015 at 10:14 pm to [facility], who verbally acknowledged these results. Electronically Signed   By: Garald Balding M.D.   On: 11/24/2015 22:15   Ct Image Guided Drainage By Percutaneous Catheter  11/25/2015  CLINICAL DATA:  Peritoneal abscess post gastrostomy tube placement EXAM: CT GUIDED DRAINAGE OF PERITONEAL ABSCESS ANESTHESIA/SEDATION: Intravenous Fentanyl and Versed were administered as conscious sedation during continuous monitoring of the patient's level of consciousness and physiological / cardiorespiratory status by the radiology RN, with a total moderate sedation time of 12 minutes. PROCEDURE: The procedure, risks, benefits, and alternatives were explained to the patient. Questions regarding the procedure were encouraged and answered. The patient understands and consents to the procedure. Select axial scans through the mid abdomen obtained. The collection was localized an appropriate skin entry site determined and marked. The operative field was prepped with chlorhexidinein a sterile fashion, and a sterile drape was applied covering the operative field. A sterile gown and sterile gloves were used for the procedure. Local anesthesia was provided with 1% Lidocaine. Under CT fluoroscopic guidance, a 19 gauge percutaneous entry needle was advanced into the collection. Thin purulent material could be aspirated. Amplatz wire advanced easily within the collection, position confirmed on CT. Tract dilated to the to facilitate placement of a 12 French pigtail catheter, placed within the central dependent aspect of the collection. Approximately 40 mL of thin purulent fluid were aspirated, a sample sent for Gram stain and culture. The catheter was secured externally with 0 Prolene  suture and StatLock and placed to gravity bag. The patient tolerated the procedure well. COMPLICATIONS: None immediate FINDINGS: CT confirms a loculated gas and fluid collection anterior to the left lobe of the liver in the midline. Abscess drain catheter placed under CT fluoroscopic guidance as above. IMPRESSION: 1. Technically successful CT-guided peritoneal abscess drain catheter placement. Electronically Signed   By: Lucrezia Europe M.D.   On: 11/25/2015 16:32   Recent Results (from the past 240 hour(s))  Culture, blood (routine x 2)     Status: None   Collection Time: 11/19/15  1:25 AM  Result Value Ref Range Status   Specimen Description BLOOD RIGHT HAND  Final   Special Requests BOTTLES DRAWN AEROBIC AND ANAEROBIC 5CC  Final   Culture   Final    NO GROWTH 5 DAYS Performed at [facility]    Report Status 11/24/2015 FINAL  Final  Culture, blood (routine x 2)     Status: None   Collection Time: 11/19/15  1:25 AM  Result Value Ref Range Status   Specimen Description BLOOD RIGHT ARM  Final   Special Requests BOTTLES DRAWN AEROBIC AND ANAEROBIC 5CC  Final   Culture   Final    NO GROWTH 5 DAYS Performed at [facility]    Report Status 11/24/2015 FINAL  Final  Culture, blood (routine x 2)     Status: None (Preliminary result)   Collection Time: 11/22/15  5:10 PM  Result Value Ref Range Status   Specimen Description BLOOD BLOOD RIGHT ARM  Final   Special Requests BOTTLES DRAWN AEROBIC AND ANAEROBIC 10CC  Final   Culture   Final    NO GROWTH 4 DAYS Performed at [facility]    Report Status PENDING  Incomplete  Culture, blood (routine x 2)     Status: None (Preliminary result)   Collection Time: 11/22/15  5:11 PM  Result Value Ref Range Status   Specimen Description BLOOD CENTRAL LINE  Final   Special Requests BOTTLES DRAWN AEROBIC AND ANAEROBIC 10CC  Final   Culture   Final    NO GROWTH 4 DAYS Performed at [facility]    Report Status PENDING   Incomplete  Culture, blood (x 2)     Status: None (Preliminary result)   Collection Time: 11/24/15  4:22 PM  Result Value Ref Range Status   Specimen Description BLOOD RIGHT ARM  Final   Special Requests BOTTLES DRAWN AEROBIC AND ANAEROBIC  10CC  Final   Culture   Final    NO GROWTH 2 DAYS Performed at [facility]    Report Status PENDING  Incomplete  Culture, blood (routine x 2)     Status: None (Preliminary result)   Collection Time: 11/24/15  4:30 PM  Result Value Ref Range Status   Specimen Description BLOOD LEFT ARM  Final   Special Requests BOTTLES DRAWN AEROBIC AND ANAEROBIC  10CC  Final   Culture   Final    NO GROWTH 2 DAYS Performed at [facility]    Report Status PENDING  Incomplete  Culture, routine-abscess     Status: None (Preliminary result)   Collection Time: 11/25/15  3:47 PM  Result Value Ref Range Status   Specimen Description DRAINAGE  Final   Special Requests Normal  Final   Gram Stain   Final    NO WBC SEEN NO SQUAMOUS EPITHELIAL CELLS SEEN ABUNDANT GRAM POSITIVE RODS Performed at [facility]    Culture PENDING  Incomplete   Report Status PENDING  Incomplete      11/26/2015, 1:47 PM     LOS: 13 days    Records and images were personally reviewed where available.

## 2015-11-26 NOTE — Progress Notes (Signed)
Paperwork (matrix) received from doctor, faxed to [facility] @ [phone], confirmation received, copy made for pt, 11/26/15 Ardeen Fillers)

## 2015-11-27 ENCOUNTER — Encounter: Payer: Self-pay | Admitting: *Deleted

## 2015-11-27 ENCOUNTER — Ambulatory Visit
Admission: RE | Admit: 2015-11-27 | Discharge: 2015-11-27 | Disposition: A | Discharge status: Home / Self Care | Payer: 59 | Source: Ambulatory Visit | Attending: Radiation Oncology | Admitting: Radiation Oncology

## 2015-11-27 ENCOUNTER — Ambulatory Visit: Payer: Self-pay

## 2015-11-27 ENCOUNTER — Ambulatory Visit: Payer: 59

## 2015-11-27 DIAGNOSIS — R5081 Fever presenting with conditions classified elsewhere: Secondary | ICD-10-CM

## 2015-11-27 DIAGNOSIS — D709 Neutropenia, unspecified: Secondary | ICD-10-CM | POA: Insufficient documentation

## 2015-11-27 LAB — CBC WITH DIFFERENTIAL/PLATELET
BASOS ABS: 0 10*3/uL (ref 0.0–0.1)
Basophils Relative: 0 %
EOS ABS: 0 10*3/uL (ref 0.0–0.7)
Eosinophils Relative: 0 %
HCT: 28.8 % — ABNORMAL LOW (ref 36.0–46.0)
Hemoglobin: 9.5 g/dL — ABNORMAL LOW (ref 12.0–15.0)
LYMPHS ABS: 1.1 10*3/uL (ref 0.7–4.0)
Lymphocytes Relative: 11 %
MCH: 22.2 pg — ABNORMAL LOW (ref 26.0–34.0)
MCHC: 33 g/dL (ref 30.0–36.0)
MCV: 67.4 fL — ABNORMAL LOW (ref 78.0–100.0)
MONO ABS: 0.6 10*3/uL (ref 0.1–1.0)
Monocytes Relative: 6 %
NEUTROS PCT: 83 %
Neutro Abs: 7.9 10*3/uL — ABNORMAL HIGH (ref 1.7–7.7)
PLATELETS: 314 10*3/uL (ref 150–400)
RBC: 4.27 MIL/uL (ref 3.87–5.11)
RDW: 22.4 % — AB (ref 11.5–15.5)
WBC: 9.6 10*3/uL (ref 4.0–10.5)

## 2015-11-27 LAB — CULTURE, BLOOD (ROUTINE X 2)
CULTURE: NO GROWTH
Culture: NO GROWTH

## 2015-11-27 LAB — BASIC METABOLIC PANEL
Anion gap: 9 (ref 5–15)
BUN: 21 mg/dL — AB (ref 6–20)
CALCIUM: 8.4 mg/dL — AB (ref 8.9–10.3)
CHLORIDE: 101 mmol/L (ref 101–111)
CO2: 25 mmol/L (ref 22–32)
CREATININE: 0.7 mg/dL (ref 0.44–1.00)
GFR calc non Af Amer: >60 mL/min (ref >60)
GLUCOSE: 120 mg/dL — AB (ref 65–99)
Potassium: 3.3 mmol/L — ABNORMAL LOW (ref 3.5–5.1)
Sodium: 135 mmol/L (ref 135–145)

## 2015-11-27 LAB — MAGNESIUM: MAGNESIUM: 1.4 mg/dL — AB (ref 1.7–2.4)

## 2015-11-27 MED ORDER — MAGNESIUM SULFATE 2 GM/50ML IV SOLN
2.0000 g | Freq: Once | INTRAVENOUS | Status: AC
  Administered 2015-11-27: 2 g via INTRAVENOUS
  Filled 2015-11-27: qty 50

## 2015-11-27 MED ORDER — OSMOLITE 1.2 CAL PO LIQD
237.0000 mL | Freq: Four times a day (QID) | ORAL | Status: DC
  Administered 2015-11-28 – 2015-11-30 (×10): 237 mL

## 2015-11-27 MED ORDER — VANCOMYCIN HCL IN DEXTROSE 1-5 GM/200ML-% IV SOLN
1000.0000 mg | Freq: Two times a day (BID) | INTRAVENOUS | Status: DC
  Administered 2015-11-27 – 2015-11-30 (×7): 1000 mg via INTRAVENOUS
  Filled 2015-11-27 (×7): qty 200

## 2015-11-27 NOTE — Care Management Note (Signed)
Case Management Note  Patient Details  Name: Debra Farrell MRN: [id number] Date of Birth: [birth year]  Subjective/Objective: [facility] chosen for HHRN-TF instruction-need HHRN order. If TF for home needed-please put in specific orders for formula, flush,& supplies. If home w/iv abx-will need specific HHRN-iv abx order,script.  [facility] rep-Susan aware & following.[facility] dme rep Lecretia aware of Vital formula & checking on if they have it in stock since patient has been having problems trying to order it.  Per patient her pharmacy will not have formula until Monday.                  Action/Plan:d/c plan home w/[facility].   Expected Discharge Date:   (unknown)               Expected Discharge Plan:  [place]  In-House Referral:     Discharge planning Services  CM Consult  Post Acute Care Choice:    Choice offered to:  Patient  DME Arranged:    DME Agency:     HH Arranged:    [place] Agency:  [place]  Status of Service:  In process, will continue to follow  Medicare Important Message Given:    Date Medicare IM Given:    Medicare IM give by:    Date Additional Medicare IM Given:    Additional Medicare Important Message give by:     If discussed at [place] of Stay Meetings, dates discussed:    Additional Comments:  Dessa Phi, RN 11/27/2015, 1:48 PM

## 2015-11-27 NOTE — Progress Notes (Signed)
  Oncology Nurse Navigator Documentation  Navigator Location: [facility] (11/27/15 1150)             Patient Visit Type: Inpatient (11/27/15 1150) Treatment Phase: Active Tx (11/27/15 1150) Barriers/Navigation Needs: Coordination of Care (11/27/15 1150)       Visited patient [facility] to follow-up on her VMM re XRT appts, concern re delivery of nutritional supplement upon pending DC.  I took [place] calendar of her appts for discussion.  She expressed concern that her XRT appts were "jumping all over the place, I like mornings".  I reviewed appts with her, explained that adjustments are being made during her inpatient status so she is available in the mornings for MD visits, ordered therapies, etc.  She verbalized understanding.  I later spoke with [facility], relayed her concern, they indicated they will adjust currently scheduled afternoon appts to mornings upon her DC.  They noted they will discuss with her when she arrives for XRT later today.  She noted concern re the delivery of nutritional supplements upon her DC (currently Vital AF 1.2 and ProStat).  I responded that I would speak with her RN to learnwhat was needed.  RN Joelene Millin an order was needed for Care Management and then CM will arrange with [facility] for delivery.  I relayed this information to Dr. Alvy Bimler who later entered an order for CM.  I notified RN Joelene Millin of pending order.  Gayleen Orem, RN, BSN, [place] [facility] [phone]                        Time Spent with Patient: 30 (11/27/15 1150)

## 2015-11-27 NOTE — Progress Notes (Signed)
Pharmacy Antibiotic Note  Debra Farrell is a 70 y.o. female admitted on 11/13/2015 with febrile neutropenia and pt started on cefepime. Pharmacy has now been consulted to dose vancomycin for abscess.  Plan: Vancomycin 1gm IV q12h Continue cefepime 2gm IV q8h Follow cultures, renal function ,clinical course Obtain vancomycin trough at steady state as needed  Height: '5\' 6"'$  (167.6 cm) Weight: 160 lb (72.576 kg) IBW/kg (Calculated) : 59.3  Temp (24hrs), Avg:99 F (37.2 C), Min:98.4 F (36.9 C), Max:99.4 F (37.4 C)   Recent Labs Lab 11/23/15 0622 11/24/15 0545 11/24/15 1622 11/25/15 0520 11/26/15 0530 11/27/15 0600  WBC 2.4* 2.8* 2.0* 1.9* 6.5 9.6  CREATININE 0.90 0.88  --  0.90 0.74 0.70    Estimated Creatinine Clearance: 68.6 mL/min (by C-G formula based on Cr of 0.7).    Allergies  Allergen Reactions  . Morphine And Related Nausea And Vomiting    Antimicrobials this admission: 2/27 Levaquin >> 2/28 2/28 Cefepime >>  3/3 vancomycin >>   Dose adjustments this admission:   Microbiology results: 2/26 BCx: NG x 4days 2/28 BCx: NG x 2 days 2/28 BCx (peripheral line):  3/1 Abscess: abundant GPR 3/2 BCx:  Thank you for allowing pharmacy to be a part of this patient's care.  Dolly Rias RPh 11/27/2015, 8:04 AM Pager [phone]

## 2015-11-27 NOTE — Care Management Note (Signed)
Case Management Note  Patient Details  Name: Debra Farrell MRN: [id number] Date of Birth: 1946-07-13  Subjective/Objective:  Noted patient will d/c on osmolite TF-[facility] dme rep Pura Spice is aware & following-she will update patient.[facility] rep Manuela Schwartz aware & following if [place] needed. If home iv abx needed-can be arranged for home.[facility] iv liason Pam aware & following. Will need HHC iv abx orders,flushes,labs.                  Action/Plan:d/c home    Expected Discharge Date:   (unknown)               Expected Discharge Plan:  [place]  In-House Referral:     Discharge planning Services  CM Consult  Post Acute Care Choice:    Choice offered to:  Patient  DME Arranged:    DME Agency:     HH Arranged:    [place] Agency:  [place]  Status of Service:  In process, will continue to follow  Medicare Important Message Given:    Date Medicare IM Given:    Medicare IM give by:    Date Additional Medicare IM Given:    Additional Medicare Important Message give by:     If discussed at [place] of Stay Meetings, dates discussed:    Additional Comments:  Dessa Phi, RN 11/27/2015, 3:57 PM

## 2015-11-27 NOTE — Progress Notes (Signed)
Nutrition Follow-up  DOCUMENTATION CODES:   Severe malnutrition in context of acute illness/injury  INTERVENTION:  -Continue to monitor Mg, Phos, K levels for refeeding. -Provided Home Tube Feeding booklet, provided needs, tube feeding formula and regimen. Answered all questions.  Continue TF regimen: 1 can Vital AF 1.2 every 4 hours (0700, 1100, 1500, 1900) Recommend water flushes of 30 ml before and after each bolus. 30 ml Prostat TID Tube feeding regimen to provide 1436 kcal (68% of needs), 117g protein (100% of needs) and 1008 ml H2O.  If patient is not receiving IVF, recommend free water flushes of 240 ml every 6 hours.  -Upon discharge provide 5 Cans Osmolite 1.2 every 4 hours (0700, 1100, 1500, 1900) Recommend water flushes of 96m before and after each bolus.  331mPro-Stat TID.  Tube feeding regimen to provide 1440 calories, 98g protein, 102051mree H2O.  NUTRITION DIAGNOSIS:   Malnutrition related to acute illness as evidenced by energy intake < or equal to 50% for > or equal to 5 days, severe depletion of body fat.  ongoing  GOAL:   Patient will meet greater than or equal to 90% of their needs  Meeting with tubefeeding  MONITOR:   PO intake, Labs, Weight trends, TF tolerance, I & O's  REASON FOR ASSESSMENT:   Consult Poor PO (Has PEG tube placement for tongue cancer. 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 She is followed by Dr. GorAlvy Bimlerd a [facility]. Of note, she does have thyroid nodules as well that were biopsied by Dr. HenAnselm Pancoaststerday.  Spoke with Ms. ZelKobeliefly at bedside. She denies any nausea/vomiting outside of brief episode this morning where they "put the tubefeeding in too fast." Stated that she could discharge today depending on cultures.  No other complaints. Labs: K 3.3, Mg  1.4, Ca 8.4 Medications: Reglan  Diet Order:  Diet regular Room service appropriate?: Yes; Fluid consistency:: Thin  Skin:  Wound (see comment) (Lip wound)  Last BM:  3/1  Height:   Ht Readings from Last 1 Encounters:  11/13/15 '5\' 6"'$  (1.676 m)    Weight:   Wt Readings from Last 1 Encounters:  11/27/15 160 lb (72.576 kg)    Ideal Body Weight:  59.1 kg  BMI:  Body mass index is 25.84 kg/(m^2).  Estimated Nutritional Needs:   Kcal:  2100-2300  Protein:  110-120g  Fluid:  2.1L/day  EDUCATION NEEDS:   Education needs addressed  WilSatira Anisard, MS, RD LDN After Hours/Weekend Pager [phone]

## 2015-11-27 NOTE — Progress Notes (Signed)
Utilization review completed.  

## 2015-11-27 NOTE — Progress Notes (Signed)
Debra Farrell   DOB:08-09-1946   NG#:[id number]    Subjective: Hospital day 15. She is doing well. Denies further nausea, vomiting or leak around the feeding tube. No fevers or chills. She tolerated tube feeds well  Objective:  Filed Vitals:   11/26/15 2246 11/27/15 0520  BP: 150/74 152/70  Pulse: 99 85  Temp:  98.4 F (36.9 C)  Resp:  20     Intake/Output Summary (Last 24 hours) at 11/27/15 0950 Last data filed at 11/27/15 0945  Gross per 24 hour  Intake    120 ml  Output   2130 ml  Net  -2010 ml    GENERAL:alert, no distress and comfortable SKIN: skin color, texture, turgor are normal, no rashes or significant lesions EYES: normal, Conjunctiva are pink and non-injected, sclera clear OROPHARYNX:no exudate, no erythema and lips, buccal mucosa, and tongue normal  NECK: supple, thyroid normal size, non-tender, without nodularity LYMPH:  no palpable lymphadenopathy in the cervical, axillary or inguinal LUNGS: clear to auscultation and percussion with normal breathing effort HEART: regular rate & rhythm and no murmurs and no lower extremity edema ABDOMEN:abdomen soft, non-tender and normal bowel sounds. Feeding tube in situ and drain in situ Musculoskeletal:no cyanosis of digits and no clubbing  NEURO: alert & oriented x 3 with fluent speech, no focal motor/sensory deficits   Labs:  Lab Results  Component Value Date   WBC 9.6 11/27/2015   HGB 9.5* 11/27/2015   HCT 28.8* 11/27/2015   MCV 67.4* 11/27/2015   PLT 314 11/27/2015   NEUTROABS 7.9* 11/27/2015    Lab Results  Component Value Date   NA 135 11/27/2015   K 3.3* 11/27/2015   CL 101 11/27/2015   CO2 25 11/27/2015    Studies:  Ct Image Guided Drainage By Percutaneous Catheter  11/25/2015  CLINICAL DATA:  Peritoneal abscess post gastrostomy tube placement EXAM: CT GUIDED DRAINAGE OF PERITONEAL ABSCESS ANESTHESIA/SEDATION: Intravenous Fentanyl and Versed were administered as conscious sedation during continuous  monitoring of the patient's level of consciousness and physiological / cardiorespiratory status by the radiology RN, with a total moderate sedation time of 12 minutes. PROCEDURE: The procedure, risks, benefits, and alternatives were explained to the patient. Questions regarding the procedure were encouraged and answered. The patient understands and consents to the procedure. Select axial scans through the mid abdomen obtained. The collection was localized an appropriate skin entry site determined and marked. The operative field was prepped with chlorhexidinein a sterile fashion, and a sterile drape was applied covering the operative field. A sterile gown and sterile gloves were used for the procedure. Local anesthesia was provided with 1% Lidocaine. Under CT fluoroscopic guidance, a 19 gauge percutaneous entry needle was advanced into the collection. Thin purulent material could be aspirated. Amplatz wire advanced easily within the collection, position confirmed on CT. Tract dilated to the to facilitate placement of a 12 French pigtail catheter, placed within the central dependent aspect of the collection. Approximately 40 mL of thin purulent fluid were aspirated, a sample sent for Gram stain and culture. The catheter was secured externally with 0 Prolene suture and StatLock and placed to gravity bag. The patient tolerated the procedure well. COMPLICATIONS: None immediate FINDINGS: CT confirms a loculated gas and fluid collection anterior to the left lobe of the liver in the midline. Abscess drain catheter placed under CT fluoroscopic guidance as above. IMPRESSION: 1. Technically successful CT-guided peritoneal abscess drain catheter placement. Electronically Signed   By: Ronald Pippins.D.  On: 11/25/2015 16:32    Assessment & Plan:   Cancer of base of tongue (HCC) Unfortunately, she developed intractable nausea and vomiting, multifactorial, could be related to side effects of chemotherapy, compounded by severe  gastritis and recent constipation Continue supportive care and radiation treatment She may not be able to tolerate further chemotherapy due to multiple complications  Intractable nausea and vomiting, improving This was related to side effects of treatment  Continue scopolamine patch, continue PO Reglan and IV Zofran to prn  Gastritis, acute She has severe acute gastritis, exacerbating her nausea and vomiting. Continue protonix On GI cocktail prn  Hyponatremia, resolved Acute renal failure, resolved Due to dehydration and recent cisplatin Continue IVF resuscitation Monitor daily Renal ultrasound is OK, uric acid OK  Hypomagnesemia Hypokalemia Renal wasting from cisplatin, poor oral intake and side effects of cefepime Replacement therapy is initiated We'll monitor closely  Moderate to severe protein calorie malnutriton Poor oral intake, improving Consult nutritionist Advancing to regular diet and intermittent bolus feeding  Leak around feeding tube, resolved Large intra-abdominal abscess s/p CT guided drain placement on 11/25/15 She has successful placement of drainage tube to the fluid collection/abscess. Fever had resolved. Cultures are pending. I have consulted infectious disease consultant to review her antibiotic therapy and to tailor her regimen and to comment on duration of antibiotics. She will begin vancomycin per ID recommendation  Essential Hypertension Was on HCTZ at home/baseline. Due to dehydration, this is stopped Started on amlodipine, added hydralazine  Pain at surgical incision, resolved Her pain has improved with IV Dilaudid. 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  Lurene Robley, MD 11/27/2015  9:50 AM

## 2015-11-27 NOTE — Progress Notes (Signed)
Patient ID: Debra Farrell, female   DOB: Nov 01, 1945, 70 y.o.   MRN: [id number]    Referring Physician(s): Oda Kilts  Supervising Physician: Simonne Come  Chief Complaint: Tongue cancer; abdominal abscess   Subjective: Pt doing fairly well today; had minimal nausea/small amount "spit up" after starting tube feed earlier, since resolved; no further leaking around G tube;  mid abd drain site mildly tender   Allergies: Morphine and related  Medications: Prior to Admission medications   Medication Sig Start Date End Date Taking? Authorizing Provider  acetaminophen-codeine (TYLENOL #3) 300-30 MG tablet Take 1 tablet by mouth every 6 (six) hours as needed. pain 10/30/15  Yes Historical Provider, MD  dexamethasone (DECADRON) 4 MG tablet Take 2 tablets by mouth once a day on the day after chemotherapy and then take 2 tablets two times a day for 2 days. Take with food. 11/10/15  Yes Artis Delay, MD  hydrochlorothiazide (HYDRODIURIL) 25 MG tablet Take 25 mg by mouth daily.   Yes Historical Provider, MD  HYDROcodone-acetaminophen (NORCO/VICODIN) 5-325 MG tablet Take 1 tablet by mouth every 6 (six) hours as needed. pain 11/03/15  Yes Historical Provider, MD  ibuprofen (ADVIL,MOTRIN) 200 MG tablet Take 400 mg by mouth every 6 (six) hours as needed for moderate pain.   Yes Historical Provider, MD  lidocaine-prilocaine (EMLA) cream Apply to affected area once Patient taking differently: Apply 1 application topically daily as needed (port access). Apply to affected area once 11/10/15  Yes Artis Delay, MD  oxyCODONE-acetaminophen (PERCOCET) 10-325 MG tablet Take 1 tablet by mouth every 4 (four) hours as needed for pain. 11/06/15  Yes Artis Delay, MD  prochlorperazine (COMPAZINE) 10 MG tablet Take 1 tablet (10 mg total) by mouth every 6 (six) hours as needed (Nausea or vomiting). 11/10/15  Yes Artis Delay, MD  scopolamine (TRANSDERM-SCOP) 1 MG/3DAYS Place 1 patch (1.5 mg total) onto the skin every 3 (three) days.  11/13/15  Yes Artis Delay, MD  ondansetron (ZOFRAN) 8 MG tablet Take 1 tablet (8 mg total) by mouth 2 (two) times daily as needed. Start on the third day after chemotherapy. Patient not taking: Reported on 11/13/2015 11/10/15   Artis Delay, MD     Vital Signs: BP 152/70 mmHg  Pulse 85  Temp(Src) 98.4 F (36.9 C) (Oral)  Resp 20  Ht 5\' 6"  (1.676 m)  Wt 160 lb (72.576 kg)  BMI 25.84 kg/m2  SpO2 96%  Physical Exam G tube intact, insertion site clean; mid abd drain intact, output 130 cc beige colored fluid; site mildly tender; cx's pend; abd soft,ND  Imaging: Ct Abdomen Pelvis W Contrast  11/24/2015  CLINICAL DATA:  Acute onset of fever and leukopenia. Leak about the G-tube. Assess for abscess. Initial encounter. EXAM: CT ABDOMEN AND PELVIS WITH CONTRAST TECHNIQUE: Multidetector CT imaging of the abdomen and pelvis was performed using the standard protocol following bolus administration of intravenous contrast. CONTRAST:  50mL OMNIPAQUE IOHEXOL 300 MG/ML SOLN, OMNIPAQUE IOHEXOL 300 MG/ML SOLN COMPARISON:  PET/CT performed 10/14/2015, and renal ultrasound performed 11/17/2015 FINDINGS: Small bilateral pleural effusions are noted, with bibasilar opacities likely reflecting atelectasis. There is a large collection of fluid and air noted tracking along the anterior aspect of the liver, measuring approximately 12.4 x 11.0 x 4.8 cm, with a relatively thin peripheral rind, compatible with an abscess. This rise adjacent to the body and antrum of the stomach, with mild surrounding soft tissue inflammation tracking inferiorly along the omentum. The patient's G-tube is noted within the stomach.  The stomach is largely decompressed. Stones are noted within the gallbladder. The gallbladder is otherwise unremarkable. The pancreas and adrenal glands are grossly unremarkable. A 1.0 cm cyst is noted at the interpole region of the right kidney. The kidneys are otherwise unremarkable. There is no evidence of  hydronephrosis. No renal or ureteral stones are seen. Minimal nonspecific perinephric stranding is noted bilaterally. No free fluid is identified. The small bowel is unremarkable in appearance. No acute vascular abnormalities are seen. Minimal calcification is noted along the abdominal aorta and its branches. The appendix is normal in caliber and contains contrast, without evidence of appendicitis. Contrast progresses to the level of the rectum. The colon is unremarkable in appearance. Apparent mild wall thickening at the rectum is thought to be transient in nature. The bladder is mildly distended and grossly unremarkable. The patient is status post hysterectomy. No suspicious adnexal masses are seen. The ovaries are relatively symmetric. Scattered postoperative change is noted within the pelvis. No inguinal lymphadenopathy is seen. No acute osseous abnormalities are identified. IMPRESSION: 1. Large abscess noted along the anterior aspect of the liver, adjacent to the body and antrum of the stomach, measuring 12.4 x 11.0 x 4.8 cm, containing fluid and air, with a peripheral rind. Mild surrounding soft tissue inflammation tracks about the antrum of the stomach and inferiorly along the omentum. 2. Small bilateral pleural effusions, with bibasilar airspace opacities likely reflecting atelectasis. 3. G-tube remains within the stomach. The stomach is largely decompressed. 4. Cholelithiasis.  Gallbladder otherwise unremarkable. 5. Small right renal cyst seen. These results were called by telephone at the time of interpretation on 11/24/2015 at 10:14 pm to [facility], who verbally acknowledged these results. Electronically Signed   By: Roanna Raider M.D.   On: 11/24/2015 22:15   Ir Cm Inj Any Colonic Tube W/fluoro  11/23/2015  INDICATION: Percutaneous gastrostomy tube placed 11/05/2015. Concern for possible leak. EXAM: GI TUBE INJECTION MEDICATIONS: None; Antibiotics were administered within 1 hour of  the procedure. Glucagon 1 mg IV ANESTHESIA/SEDATION: Moderate Sedation Time:  None The patient was continuously monitored during the procedure by the interventional radiology nurse under my direct supervision. CONTRAST:  10 mL Omnipaque 300 - administered into the gastric lumen. FLUOROSCOPY TIME:  Fluoroscopy Time: 6 seconds COMPLICATIONS: None immediate. PROCEDURE: Informed written consent was obtained from the patient after a thorough discussion of the procedural risks, benefits and alternatives. All questions were addressed. Maximal Sterile Barrier Technique was utilized including caps, mask, sterile gowns, sterile gloves, sterile drape, hand hygiene and skin antiseptic. A timeout was performed prior to the initiation of the procedure. Gastrostomy tube was inspected and found to be without obvious defects. The tube was then injected with approximately 10 cc of contrast which promptly filled the gastric lumen. Several images were taken confirming no evidence of leak or obstruction. Gastrostomy tube is in proper position. IMPRESSION: Properly position the gastrostomy tube without evidence of leak. Read by: Brayton El PA-C Electronically Signed   By: Jolaine Click M.D.   On: 11/23/2015 13:00   Ct Image Guided Drainage By Percutaneous Catheter  11/25/2015  CLINICAL DATA:  Peritoneal abscess post gastrostomy tube placement EXAM: CT GUIDED DRAINAGE OF PERITONEAL ABSCESS ANESTHESIA/SEDATION: Intravenous Fentanyl and Versed were administered as conscious sedation during continuous monitoring of the patient's level of consciousness and physiological / cardiorespiratory status by the radiology RN, with a total moderate sedation time of 12 minutes. PROCEDURE: The procedure, risks, benefits, and alternatives were explained to the  patient. Questions regarding the procedure were encouraged and answered. The patient understands and consents to the procedure. Select axial scans through the mid abdomen obtained. The  collection was localized an appropriate skin entry site determined and marked. The operative field was prepped with chlorhexidinein a sterile fashion, and a sterile drape was applied covering the operative field. A sterile gown and sterile gloves were used for the procedure. Local anesthesia was provided with 1% Lidocaine. Under CT fluoroscopic guidance, a 19 gauge percutaneous entry needle was advanced into the collection. Thin purulent material could be aspirated. Amplatz wire advanced easily within the collection, position confirmed on CT. Tract dilated to the to facilitate placement of a 12 French pigtail catheter, placed within the central dependent aspect of the collection. Approximately 40 mL of thin purulent fluid were aspirated, a sample sent for Gram stain and culture. The catheter was secured externally with 0 Prolene suture and StatLock and placed to gravity bag. The patient tolerated the procedure well. COMPLICATIONS: None immediate FINDINGS: CT confirms a loculated gas and fluid collection anterior to the left lobe of the liver in the midline. Abscess drain catheter placed under CT fluoroscopic guidance as above. IMPRESSION: 1. Technically successful CT-guided peritoneal abscess drain catheter placement. Electronically Signed   By: Corlis Leak M.D.   On: 11/25/2015 16:32    Labs:  CBC:  Recent Labs  11/24/15 1622 11/25/15 0520 11/26/15 0530 11/27/15 0600  WBC 2.0* 1.9* 6.5 9.6  HGB 7.3* 7.3* 9.2* 9.5*  HCT 22.7* 22.8* 27.4* 28.8*  PLT 204 257 248 314    COAGS:  Recent Labs  10/27/15 1000 11/05/15 1212  INR 1.08 1.18  APTT  --  35    BMP:  Recent Labs  11/24/15 0545 11/25/15 0520 11/25/15 1500 11/26/15 0530 11/27/15 0600  NA 135 130*  --  135 135  K 3.6 2.7* 2.8* 3.0* 3.3*  CL 101 96*  --  101 101  CO2 25 25  --  26 25  GLUCOSE 104* 110*  --  90 120*  BUN 20 20  --  17 21*  CALCIUM 8.4* 8.0*  --  8.4* 8.4*  CREATININE 0.88 0.90  --  0.74 0.70  GFRNONAA >60  >60  --  >60 >60  GFRAA >60 >60  --  >60 >60    LIVER FUNCTION TESTS:  Recent Labs  11/21/15 0430 11/22/15 0623 11/23/15 0622 11/24/15 0545  BILITOT 0.8 1.1 1.4* 1.4*  AST 25 31 34 33  ALT 24 31 30 31   ALKPHOS 89 108 91 88  PROT 5.4* 5.6* 5.8* 6.0*  ALBUMIN 2.5* 2.4* 2.3* 2.3*    Assessment and Plan: S/p perc G tube 11/05/15; mid abd /peritoneal abscess drain 3/1 ; check final cx's; WBC, creat nl; hgb stable; K 3.3- replace; pt scheduled for f/u CT in our drain clinic on 3/14   Electronically Signed: D. Jeananne Rama 11/27/2015, 12:22 PM   I spent a total of 15 minutes at the the patient's bedside AND on the patient's hospital floor or unit, greater than 50% of which was counseling/coordinating care for abdominal abscess drain

## 2015-11-27 NOTE — Progress Notes (Signed)
[place] Therapy Treatment Record Phone [phone]   Radiation Therapy was administered to Debra Farrell on: 11/27/2015  3:59 PM and was treatment # 10 out of a planned course of 35 treatments.

## 2015-11-27 NOTE — Progress Notes (Signed)
INFECTIOUS DISEASE PROGRESS NOTE  ID: Debra Farrell is a 70 y.o. female with  Active Problems:   Cancer of base of tongue (HCC)   Intractable nausea and vomiting   Gastritis, acute   Severe protein-calorie malnutrition (HCC)   Acute renal failure (ARF) (HCC)   Dehydration   Hypokalemia   Hypomagnesemia   Hypophosphatemia   Anemia due to other cause   Renal failure   Fever   Abdominal pain   Abscess  Subjective: Emesis this AM  Abtx:  Anti-infectives    Start     Dose/Rate Route Frequency Ordered Stop   11/27/15 0800  vancomycin (VANCOCIN) IVPB 1000 mg/200 mL premix     1,000 mg 200 mL/hr over 60 Minutes Intravenous Every 12 hours 11/27/15 0748     11/25/15 0100  ceFEPIme (MAXIPIME) 2 g in dextrose 5 % 50 mL IVPB     2 g 100 mL/hr over 30 Minutes Intravenous Every 8 hours 11/24/15 1604     11/24/15 1615  ceFEPIme (MAXIPIME) 2 g in dextrose 5 % 50 mL IVPB     2 g 100 mL/hr over 30 Minutes Intravenous STAT 11/24/15 1603 11/24/15 1707   11/23/15 0830  levofloxacin (LEVAQUIN) IVPB 750 mg  Status:  Discontinued     750 mg 100 mL/hr over 90 Minutes Intravenous Every 24 hours 11/23/15 0807 11/24/15 1559      Medications:  Scheduled: . amLODipine  5 mg Per Tube BID  . ceFEPime (MAXIPIME) IV  2 g Intravenous Q8H  . chlorhexidine  15 mL Mouth Rinse BID  . enoxaparin (LOVENOX) injection  40 mg Subcutaneous Q24H  . feeding supplement (PRO-STAT SUGAR FREE 64)  30 mL Per Tube TID  . feeding supplement (VITAL AF 1.2 CAL)  237 mL Per Tube QID  . folic acid  2 mg Oral Daily  . hydrALAZINE  10 mg Oral 3 times per day  . magnesium oxide  400 mg Oral BID  . metoCLOPramide  5 mg Oral TID AC & HS  . pantoprazole sodium  40 mg Per Tube Daily  . potassium chloride  40 mEq Oral BID  . scopolamine  1 patch Transdermal Q72H  . sodium chloride flush  10-40 mL Intracatheter Q12H  . vancomycin  1,000 mg Intravenous Q12H    Objective: Vital signs in last 24 hours: Temp:  [98.4 F  (36.9 C)-99.4 F (37.4 C)] 98.4 F (36.9 C) (03/03 0520) Pulse Rate:  [85-101] 85 (03/03 0520) Resp:  [20] 20 (03/03 0520) BP: (145-160)/(62-74) 152/70 mmHg (03/03 0520) SpO2:  [93 %-96 %] 96 % (03/03 0520) Weight:  [72.576 kg (160 lb)] 72.576 kg (160 lb) (03/03 0520)   General appearance: alert, cooperative and no distress Resp: clear to auscultation bilaterally Chest wall: no tenderness, port site is clean.  Cardio: regular rate and rhythm GI: normal findings: bowel sounds normal and soft, non-tender and peg site clean.   Lab Results  Recent Labs  11/26/15 0530 11/27/15 0600  WBC 6.5 9.6  HGB 9.2* 9.5*  HCT 27.4* 28.8*  NA 135 135  K 3.0* 3.3*  CL 101 101  CO2 26 25  BUN 17 21*  CREATININE 0.74 0.70   Liver Panel No results for input(s): PROT, ALBUMIN, AST, ALT, ALKPHOS, BILITOT, BILIDIR, IBILI in the last 72 hours. Sedimentation Rate No results for input(s): ESRSEDRATE in the last 72 hours. C-Reactive Protein No results for input(s): CRP in the last 72 hours.  Microbiology: Recent Results (from  the past 240 hour(s))  Culture, blood (routine x 2)     Status: None   Collection Time: 11/19/15  1:25 AM  Result Value Ref Range Status   Specimen Description BLOOD RIGHT HAND  Final   Special Requests BOTTLES DRAWN AEROBIC AND ANAEROBIC 5CC  Final   Culture   Final    NO GROWTH 5 DAYS Performed at [facility]    Report Status 11/24/2015 FINAL  Final  Culture, blood (routine x 2)     Status: None   Collection Time: 11/19/15  1:25 AM  Result Value Ref Range Status   Specimen Description BLOOD RIGHT ARM  Final   Special Requests BOTTLES DRAWN AEROBIC AND ANAEROBIC 5CC  Final   Culture   Final    NO GROWTH 5 DAYS Performed at [facility]    Report Status 11/24/2015 FINAL  Final  Culture, blood (routine x 2)     Status: None   Collection Time: 11/22/15  5:10 PM  Result Value Ref Range Status   Specimen Description BLOOD BLOOD RIGHT ARM  Final    Special Requests BOTTLES DRAWN AEROBIC AND ANAEROBIC 10CC  Final   Culture   Final    NO GROWTH 5 DAYS Performed at [facility]    Report Status 11/27/2015 FINAL  Final  Culture, blood (routine x 2)     Status: None   Collection Time: 11/22/15  5:11 PM  Result Value Ref Range Status   Specimen Description BLOOD CENTRAL LINE  Final   Special Requests BOTTLES DRAWN AEROBIC AND ANAEROBIC 10CC  Final   Culture   Final    NO GROWTH 5 DAYS Performed at [facility]    Report Status 11/27/2015 FINAL  Final  Culture, blood (x 2)     Status: None (Preliminary result)   Collection Time: 11/24/15  4:22 PM  Result Value Ref Range Status   Specimen Description BLOOD RIGHT ARM  Final   Special Requests BOTTLES DRAWN AEROBIC AND ANAEROBIC  10CC  Final   Culture   Final    NO GROWTH 3 DAYS Performed at [facility]    Report Status PENDING  Incomplete  Culture, blood (routine x 2)     Status: None (Preliminary result)   Collection Time: 11/24/15  4:30 PM  Result Value Ref Range Status   Specimen Description BLOOD LEFT ARM  Final   Special Requests BOTTLES DRAWN AEROBIC AND ANAEROBIC  10CC  Final   Culture   Final    NO GROWTH 3 DAYS Performed at [facility]    Report Status PENDING  Incomplete  Culture, routine-abscess     Status: None (Preliminary result)   Collection Time: 11/25/15  3:47 PM  Result Value Ref Range Status   Specimen Description DRAINAGE  Final   Special Requests Normal  Final   Gram Stain   Final    NO WBC SEEN NO SQUAMOUS EPITHELIAL CELLS SEEN ABUNDANT GRAM POSITIVE RODS Performed at [facility]    Culture   Final    Culture reincubated for better growth Performed at [facility]    Report Status PENDING  Incomplete    Studies/Results: Ct Image Guided Drainage By Percutaneous Catheter  11/25/2015  CLINICAL DATA:  Peritoneal abscess post gastrostomy tube placement EXAM: CT GUIDED DRAINAGE OF PERITONEAL ABSCESS  ANESTHESIA/SEDATION: Intravenous Fentanyl and Versed were administered as conscious sedation during continuous monitoring of the patient's level of consciousness and physiological /  cardiorespiratory status by the radiology RN, with a total moderate sedation time of 12 minutes. PROCEDURE: The procedure, risks, benefits, and alternatives were explained to the patient. Questions regarding the procedure were encouraged and answered. The patient understands and consents to the procedure. Select axial scans through the mid abdomen obtained. The collection was localized an appropriate skin entry site determined and marked. The operative field was prepped with chlorhexidinein a sterile fashion, and a sterile drape was applied covering the operative field. A sterile gown and sterile gloves were used for the procedure. Local anesthesia was provided with 1% Lidocaine. Under CT fluoroscopic guidance, a 19 gauge percutaneous entry needle was advanced into the collection. Thin purulent material could be aspirated. Amplatz wire advanced easily within the collection, position confirmed on CT. Tract dilated to the to facilitate placement of a 12 French pigtail catheter, placed within the central dependent aspect of the collection. Approximately 40 mL of thin purulent fluid were aspirated, a sample sent for Gram stain and culture. The catheter was secured externally with 0 Prolene suture and StatLock and placed to gravity bag. The patient tolerated the procedure well. COMPLICATIONS: None immediate FINDINGS: CT confirms a loculated gas and fluid collection anterior to the left lobe of the liver in the midline. Abscess drain catheter placed under CT fluoroscopic guidance as above. IMPRESSION: 1. Technically successful CT-guided peritoneal abscess drain catheter placement. Electronically Signed   By: Lucrezia Europe M.D.   On: 11/25/2015 16:32     Assessment/Plan: Intra-abdominal Abscess Cx pending- spoke with lab- growing a "beta" no  further info Recommendations regarding anbx pending the ID of her organism  Tongue Ca Further therapy per Dr Alvy Bimler  Neutropenia Improved with GCSF ANC now 7900  Sever protein calorie malnutrition Has nutrition f/u         Bobby Rumpf Infectious Diseases (pager) [phone] [email or web address] 11/27/2015, 11:45 AM

## 2015-11-28 ENCOUNTER — Ambulatory Visit: Payer: Self-pay

## 2015-11-28 DIAGNOSIS — K651 Peritoneal abscess: Secondary | ICD-10-CM

## 2015-11-28 DIAGNOSIS — D709 Neutropenia, unspecified: Secondary | ICD-10-CM

## 2015-11-28 DIAGNOSIS — Z931 Gastrostomy status: Secondary | ICD-10-CM

## 2015-11-28 DIAGNOSIS — B9689 Other specified bacterial agents as the cause of diseases classified elsewhere: Secondary | ICD-10-CM

## 2015-11-28 LAB — COMPREHENSIVE METABOLIC PANEL
ALBUMIN: 2.3 g/dL — AB (ref 3.5–5.0)
ALK PHOS: 96 U/L (ref 38–126)
ALT: 29 U/L (ref 14–54)
AST: 31 U/L (ref 15–41)
Anion gap: 8 (ref 5–15)
BILIRUBIN TOTAL: 0.5 mg/dL (ref 0.3–1.2)
BUN: 21 mg/dL — AB (ref 6–20)
CALCIUM: 8.2 mg/dL — AB (ref 8.9–10.3)
CO2: 27 mmol/L (ref 22–32)
Chloride: 97 mmol/L — ABNORMAL LOW (ref 101–111)
Creatinine, Ser: 0.65 mg/dL (ref 0.44–1.00)
GFR calc Af Amer: >60 mL/min (ref >60)
GFR calc non Af Amer: >60 mL/min (ref >60)
GLUCOSE: 114 mg/dL — AB (ref 65–99)
POTASSIUM: 3.5 mmol/L (ref 3.5–5.1)
Sodium: 132 mmol/L — ABNORMAL LOW (ref 135–145)
TOTAL PROTEIN: 6.2 g/dL — AB (ref 6.5–8.1)

## 2015-11-28 LAB — MAGNESIUM: Magnesium: 1.6 mg/dL — ABNORMAL LOW (ref 1.7–2.4)

## 2015-11-28 NOTE — Progress Notes (Signed)
[place] for Infectious Disease   Reason for visit: Follow up on abdominal abscess.  Interval History: culture growing GPR.  No ID yet.  States she is ready to go home.  No fever, no chills.   Physical Exam: Constitutional:  Filed Vitals:   11/27/15 2205 11/28/15 0525  BP: 139/62 166/99  Pulse: 83 98  Temp: 98.5 F (36.9 C) 99.5 F (37.5 C)  Resp: 20 20   patient appears in NAD Respiratory: Normal respiratory effort; CTA B Cardiovascular: RRR GI: Peg tube in place.  Bandaged.   Review of Systems: Constitutional: negative for fevers, chills and fatigue Gastrointestinal: negative for nausea and diarrhea  Lab Results  Component Value Date   WBC 9.6 11/27/2015   HGB 9.5* 11/27/2015   HCT 28.8* 11/27/2015   MCV 67.4* 11/27/2015   PLT 314 11/27/2015    Lab Results  Component Value Date   CREATININE 0.65 11/28/2015   BUN 21* 11/28/2015   NA 132* 11/28/2015   K 3.5 11/28/2015   CL 97* 11/28/2015   CO2 27 11/28/2015    Lab Results  Component Value Date   ALT 29 11/28/2015   AST 31 11/28/2015   ALKPHOS 96 11/28/2015     Microbiology: Recent Results (from the past 240 hour(s))  Culture, blood (routine x 2)     Status: None   Collection Time: 11/19/15  1:25 AM  Result Value Ref Range Status   Specimen Description BLOOD RIGHT HAND  Final   Special Requests BOTTLES DRAWN AEROBIC AND ANAEROBIC 5CC  Final   Culture   Final    NO GROWTH 5 DAYS Performed at [facility]    Report Status 11/24/2015 FINAL  Final  Culture, blood (routine x 2)     Status: None   Collection Time: 11/19/15  1:25 AM  Result Value Ref Range Status   Specimen Description BLOOD RIGHT ARM  Final   Special Requests BOTTLES DRAWN AEROBIC AND ANAEROBIC 5CC  Final   Culture   Final    NO GROWTH 5 DAYS Performed at [facility]    Report Status 11/24/2015 FINAL  Final  Culture, blood (routine x 2)     Status: None   Collection Time: 11/22/15  5:10 PM  Result Value Ref  Range Status   Specimen Description BLOOD BLOOD RIGHT ARM  Final   Special Requests BOTTLES DRAWN AEROBIC AND ANAEROBIC 10CC  Final   Culture   Final    NO GROWTH 5 DAYS Performed at [facility]    Report Status 11/27/2015 FINAL  Final  Culture, blood (routine x 2)     Status: None   Collection Time: 11/22/15  5:11 PM  Result Value Ref Range Status   Specimen Description BLOOD CENTRAL LINE  Final   Special Requests BOTTLES DRAWN AEROBIC AND ANAEROBIC 10CC  Final   Culture   Final    NO GROWTH 5 DAYS Performed at [facility]    Report Status 11/27/2015 FINAL  Final  Culture, blood (x 2)     Status: None (Preliminary result)   Collection Time: 11/24/15  4:22 PM  Result Value Ref Range Status   Specimen Description BLOOD RIGHT ARM  Final   Special Requests BOTTLES DRAWN AEROBIC AND ANAEROBIC  10CC  Final   Culture   Final    NO GROWTH 3 DAYS Performed at [facility]    Report Status PENDING  Incomplete  Culture, blood (routine x  2)     Status: None (Preliminary result)   Collection Time: 11/24/15  4:30 PM  Result Value Ref Range Status   Specimen Description BLOOD LEFT ARM  Final   Special Requests BOTTLES DRAWN AEROBIC AND ANAEROBIC  10CC  Final   Culture   Final    NO GROWTH 3 DAYS Performed at [facility]    Report Status PENDING  Incomplete  Culture, routine-abscess     Status: None (Preliminary result)   Collection Time: 11/25/15  3:47 PM  Result Value Ref Range Status   Specimen Description DRAINAGE  Final   Special Requests Normal  Final   Gram Stain   Final    NO WBC SEEN NO SQUAMOUS EPITHELIAL CELLS SEEN ABUNDANT GRAM POSITIVE RODS Performed at [facility]   Final    Culture reincubated for better growth Performed at [facility]    Report Status PENDING  Incomplete    Impression/Plan:  1. Abdominal abscess - continue with current antibiotics pending ID 2. neurtropenia - no ANC today.

## 2015-11-28 NOTE — Progress Notes (Signed)
Patient with fall during this admission and should be on bed alarm per high fall risk protocol however patient refuses to have alarm on.  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 At this time patient does not have alarm turned on and understands that she is to call for assistance when she needs to get to the bathroom.

## 2015-11-29 DIAGNOSIS — A4902 Methicillin resistant Staphylococcus aureus infection, unspecified site: Secondary | ICD-10-CM

## 2015-11-29 DIAGNOSIS — B9562 Methicillin resistant Staphylococcus aureus infection as the cause of diseases classified elsewhere: Secondary | ICD-10-CM

## 2015-11-29 DIAGNOSIS — L02211 Cutaneous abscess of abdominal wall: Secondary | ICD-10-CM

## 2015-11-29 LAB — COMPREHENSIVE METABOLIC PANEL
ALBUMIN: 2.4 g/dL — AB (ref 3.5–5.0)
ALT: 30 U/L (ref 14–54)
ANION GAP: 7 (ref 5–15)
AST: 29 U/L (ref 15–41)
Alkaline Phosphatase: 91 U/L (ref 38–126)
BILIRUBIN TOTAL: 0.6 mg/dL (ref 0.3–1.2)
BUN: 21 mg/dL — ABNORMAL HIGH (ref 6–20)
CO2: 28 mmol/L (ref 22–32)
Calcium: 8.5 mg/dL — ABNORMAL LOW (ref 8.9–10.3)
Chloride: 96 mmol/L — ABNORMAL LOW (ref 101–111)
Creatinine, Ser: 0.71 mg/dL (ref 0.44–1.00)
GFR calc Af Amer: >60 mL/min (ref >60)
Glucose, Bld: 118 mg/dL — ABNORMAL HIGH (ref 65–99)
POTASSIUM: 4 mmol/L (ref 3.5–5.1)
Sodium: 131 mmol/L — ABNORMAL LOW (ref 135–145)
TOTAL PROTEIN: 6.2 g/dL — AB (ref 6.5–8.1)

## 2015-11-29 LAB — CULTURE, BLOOD (ROUTINE X 2)
Culture: NO GROWTH
Culture: NO GROWTH

## 2015-11-29 LAB — CULTURE, ROUTINE-ABSCESS
Gram Stain: NONE SEEN
Special Requests: NORMAL

## 2015-11-29 NOTE — Progress Notes (Signed)
CRITICAL VALUE ALERT  Critical value received:  Abscess Culture results  + MRSA  Date of notification:  11/29/2015   Time of notification:  8:42 AM   Critical value read back:Yes.    Nurse who received alert:  Joaquin Courts   MD notified (1st page):  mohamed   Time of first page:  8:42 AM   MD notified (2nd page):  Time of second page:  Responding MD:  mohamed  Time MD responded:  [phone]

## 2015-11-29 NOTE — Progress Notes (Signed)
[place] for Infectious Disease   Reason for visit: Follow up on abdominal abscess.  Interval History: culture grew MRSA.  Still drainage coming out.  Afebrile.   Physical Exam: Constitutional:  Filed Vitals:   11/29/15 0500 11/29/15 1404  BP: 141/76 138/74  Pulse: 86 90  Temp: 99.2 F (37.3 C) 99.3 F (37.4 C)  Resp: 18 18   patient appears in NAD Respiratory: Normal respiratory effort; CTA B Cardiovascular: RRR GI: Peg tube in place.  Bandaged. Drain with pus-like drainage  Review of Systems: Constitutional: negative for fevers, chills and fatigue Gastrointestinal: negative for nausea and diarrhea  Lab Results  Component Value Date   WBC 9.6 11/27/2015   HGB 9.5* 11/27/2015   HCT 28.8* 11/27/2015   MCV 67.4* 11/27/2015   PLT 314 11/27/2015    Lab Results  Component Value Date   CREATININE 0.71 11/29/2015   BUN 21* 11/29/2015   NA 131* 11/29/2015   K 4.0 11/29/2015   CL 96* 11/29/2015   CO2 28 11/29/2015    Lab Results  Component Value Date   ALT 30 11/29/2015   AST 29 11/29/2015   ALKPHOS 91 11/29/2015     Microbiology: Recent Results (from the past 240 hour(s))  Culture, blood (routine x 2)     Status: None   Collection Time: 11/22/15  5:10 PM  Result Value Ref Range Status   Specimen Description BLOOD BLOOD RIGHT ARM  Final   Special Requests BOTTLES DRAWN AEROBIC AND ANAEROBIC 10CC  Final   Culture   Final    NO GROWTH 5 DAYS Performed at [facility]    Report Status 11/27/2015 FINAL  Final  Culture, blood (routine x 2)     Status: None   Collection Time: 11/22/15  5:11 PM  Result Value Ref Range Status   Specimen Description BLOOD CENTRAL LINE  Final   Special Requests BOTTLES DRAWN AEROBIC AND ANAEROBIC 10CC  Final   Culture   Final    NO GROWTH 5 DAYS Performed at [facility]    Report Status 11/27/2015 FINAL  Final  Culture, blood (x 2)     Status: None   Collection Time: 11/24/15  4:22 PM  Result Value Ref  Range Status   Specimen Description BLOOD RIGHT ARM  Final   Special Requests BOTTLES DRAWN AEROBIC AND ANAEROBIC  10CC  Final   Culture   Final    NO GROWTH 5 DAYS Performed at [facility]    Report Status 11/29/2015 FINAL  Final  Culture, blood (routine x 2)     Status: None   Collection Time: 11/24/15  4:30 PM  Result Value Ref Range Status   Specimen Description BLOOD LEFT ARM  Final   Special Requests BOTTLES DRAWN AEROBIC AND ANAEROBIC  10CC  Final   Culture   Final    NO GROWTH 5 DAYS Performed at [facility]    Report Status 11/29/2015 FINAL  Final  Culture, routine-abscess     Status: None   Collection Time: 11/25/15  3:47 PM  Result Value Ref Range Status   Specimen Description DRAINAGE  Final   Special Requests Normal  Final   Gram Stain   Final    NO WBC SEEN NO SQUAMOUS EPITHELIAL CELLS SEEN ABUNDANT GRAM POSITIVE RODS Performed at [facility]    Culture   Final    FEW METHICILLIN RESISTANT STAPHYLOCOCCUS AUREUS Note: RIFAMPIN AND GENTAMICIN SHOULD NOT BE  USED AS SINGLE DRUGS FOR TREATMENT OF STAPH INFECTIONS. This organism DOES NOT demonstrate inducible Clindamycin resistance in vitro. CRITICAL RESULT CALLED TO, READ BACK BY AND VERIFIED WITH: DIANA T. AT  8:43AM ON 11/29/15 HAJAM Performed at [facility]    Report Status 11/29/2015 FINAL  Final   Organism ID, Bacteria METHICILLIN RESISTANT STAPHYLOCOCCUS AUREUS  Final      Susceptibility   Methicillin resistant staphylococcus aureus - MIC*    CLINDAMYCIN <=0.25 SENSITIVE Sensitive     ERYTHROMYCIN >=8 RESISTANT Resistant     GENTAMICIN <=0.5 SENSITIVE Sensitive     LEVOFLOXACIN >=8 RESISTANT Resistant     OXACILLIN >=4 RESISTANT Resistant     RIFAMPIN <=0.5 SENSITIVE Sensitive     TRIMETH/SULFA >=320 RESISTANT Resistant     VANCOMYCIN 1 SENSITIVE Sensitive     TETRACYCLINE <=1 SENSITIVE Sensitive     * FEW METHICILLIN RESISTANT STAPHYLOCOCCUS AUREUS     Impression/Plan:  1. Abdominal abscess - will narrow to vancomycin  2. neurtropenia - no ANC today.

## 2015-11-29 NOTE — Progress Notes (Signed)
Subjective: The patient is seen and examined today. She is feeling fine today with no specific complaints. She denied having any significant fever or chills. She has no nausea or vomiting. She is interested in going home and wondered if this can be done tomorrow. The cultures from the wound drainage showed abundant gram-positive rods with few MRSA sensitive to clindamycin, gentamicin, rifampin, tetracycline and vancomycin.  Objective: Vital signs in last 24 hours: Temp:  [98.9 F (37.2 C)-99.3 F (37.4 C)] 99.2 F (37.3 C) (03/05 0500) Pulse Rate:  [83-86] 86 (03/05 0500) Resp:  [16-18] 18 (03/05 0500) BP: (141-147)/(67-76) 141/76 mmHg (03/05 0500) SpO2:  [97 %-99 %] 99 % (03/05 0500) Weight:  [160 lb (72.576 kg)] 160 lb (72.576 kg) (03/05 0500)  Intake/Output from previous day: 03/04 0701 - 03/05 0700 In: 1203 [P.O.:420; I.V.:176; IV Piggyback:250] Out: 2300 [Urine:2250; Drains:50] Intake/Output this shift: Total I/O In: 10 [Other:10] Out: 30 [Drains:30]  General appearance: alert, cooperative and no distress Resp: clear to auscultation bilaterally Cardio: regular rate and rhythm, S1, S2 normal, no murmur, click, rub or gallop GI: soft, non-tender; bowel sounds normal; no masses,  no organomegaly Extremities: extremities normal, atraumatic, no cyanosis or edema  Lab Results:   Recent Labs  11/27/15 0600  WBC 9.6  HGB 9.5*  HCT 28.8*  PLT 314   BMET  Recent Labs  11/28/15 0513 11/29/15 0622  NA 132* 131*  K 3.5 4.0  CL 97* 96*  CO2 27 28  GLUCOSE 114* 118*  BUN 21* 21*  CREATININE 0.65 0.71  CALCIUM 8.2* 8.5*    Studies/Results: No results found.  Medications: I have reviewed the patient's current medications.  Assessment/Plan: This is a very pleasant 70 years old white female with the following medical issues: 1) cancer of the base of the tongue and currently undergoing a course of concurrent chemoradiation. The patient is tolerating her treatment with  the radiotherapy fairly well but she has some concern about continuing further chemotherapy. Her chemotherapy is currently on hold and Dr. Alvy Bimler would make final decision regarding the next cycle of the treatment. 2) intractable nausea and vomiting: Significantly improved. The patient will continue her current treatment with scopolamine patch, Reglan and Zofran. 3) abdominal abscesses with culture positive for MRSA: The patient is currently followed by infectious disease. Continue vancomycin and cefepime for now until further recommendation from infectious disease. 4) hyponatremia: Very mild, will continue to monitor. Continue IV normal saline. 5) disposition: Dr. Alvy Bimler will make his decision regarding her discharge tomorrow.   LOS: 16 days    Charlis Harner K. 11/29/2015

## 2015-11-30 ENCOUNTER — Ambulatory Visit: Payer: Self-pay | Admitting: *Deleted

## 2015-11-30 ENCOUNTER — Other Ambulatory Visit: Payer: Self-pay | Admitting: Hematology and Oncology

## 2015-11-30 ENCOUNTER — Ambulatory Visit: Payer: 59

## 2015-11-30 ENCOUNTER — Encounter: Payer: Self-pay | Admitting: Radiation Oncology

## 2015-11-30 ENCOUNTER — Telehealth: Payer: Self-pay | Admitting: Hematology and Oncology

## 2015-11-30 ENCOUNTER — Ambulatory Visit
Admission: RE | Admit: 2015-11-30 | Discharge: 2015-11-30 | Disposition: A | Discharge status: Home / Self Care | Payer: 59 | Source: Ambulatory Visit | Attending: Radiation Oncology | Admitting: Radiation Oncology

## 2015-11-30 ENCOUNTER — Encounter: Payer: Self-pay | Admitting: Hematology and Oncology

## 2015-11-30 VITALS — BP 130/70 | HR 91 | Temp 98.6°F | Ht 66.0 in | Wt 153.7 lb

## 2015-11-30 DIAGNOSIS — IMO0002 Reserved for concepts with insufficient information to code with codable children: Secondary | ICD-10-CM

## 2015-11-30 DIAGNOSIS — E785 Hyperlipidemia, unspecified: Secondary | ICD-10-CM | POA: Diagnosis not present

## 2015-11-30 DIAGNOSIS — C01 Malignant neoplasm of base of tongue: Secondary | ICD-10-CM

## 2015-11-30 DIAGNOSIS — Z809 Family history of malignant neoplasm, unspecified: Secondary | ICD-10-CM | POA: Diagnosis not present

## 2015-11-30 DIAGNOSIS — D563 Thalassemia minor: Secondary | ICD-10-CM | POA: Diagnosis not present

## 2015-11-30 DIAGNOSIS — A4902 Methicillin resistant Staphylococcus aureus infection, unspecified site: Secondary | ICD-10-CM

## 2015-11-30 DIAGNOSIS — L309 Dermatitis, unspecified: Secondary | ICD-10-CM | POA: Diagnosis not present

## 2015-11-30 DIAGNOSIS — I10 Essential (primary) hypertension: Secondary | ICD-10-CM | POA: Diagnosis not present

## 2015-11-30 DIAGNOSIS — Z510 Encounter for antineoplastic radiation therapy: Secondary | ICD-10-CM | POA: Diagnosis not present

## 2015-11-30 HISTORY — DX: Methicillin resistant Staphylococcus aureus infection, unspecified site: A49.02

## 2015-11-30 HISTORY — DX: Reserved for concepts with insufficient information to code with codable children: IMO0002

## 2015-11-30 LAB — COMPREHENSIVE METABOLIC PANEL
ALBUMIN: 2.3 g/dL — AB (ref 3.5–5.0)
ALT: 27 U/L (ref 14–54)
AST: 25 U/L (ref 15–41)
Alkaline Phosphatase: 87 U/L (ref 38–126)
Anion gap: 5 (ref 5–15)
BUN: 19 mg/dL (ref 6–20)
CHLORIDE: 98 mmol/L — AB (ref 101–111)
CO2: 27 mmol/L (ref 22–32)
Calcium: 8.3 mg/dL — ABNORMAL LOW (ref 8.9–10.3)
Creatinine, Ser: 0.64 mg/dL (ref 0.44–1.00)
GFR calc Af Amer: >60 mL/min (ref >60)
GLUCOSE: 110 mg/dL — AB (ref 65–99)
POTASSIUM: 3.9 mmol/L (ref 3.5–5.1)
Sodium: 130 mmol/L — ABNORMAL LOW (ref 135–145)
Total Bilirubin: 0.6 mg/dL (ref 0.3–1.2)
Total Protein: 6 g/dL — ABNORMAL LOW (ref 6.5–8.1)

## 2015-11-30 LAB — VANCOMYCIN, TROUGH: VANCOMYCIN TR: 19 ug/mL (ref 10.0–20.0)

## 2015-11-30 MED ORDER — SUCRALFATE 1 G PO TABS
ORAL_TABLET | ORAL | Status: DC
Start: 2015-11-30 — End: 2016-01-13

## 2015-11-30 MED ORDER — LIDOCAINE VISCOUS 2 % MT SOLN: OROMUCOSAL | Status: DC

## 2015-11-30 MED ORDER — AMLODIPINE BESYLATE 5 MG PO TABS: 5.0000 mg | ORAL_TABLET | Freq: Two times a day (BID) | ORAL | Status: DC

## 2015-11-30 MED ORDER — OSMOLITE 1.2 CAL PO LIQD: 237.0000 mL | Freq: Four times a day (QID) | ORAL | Status: DC

## 2015-11-30 MED ORDER — VANCOMYCIN HCL IN DEXTROSE 1-5 GM/200ML-% IV SOLN: 1000.0000 mg | Freq: Two times a day (BID) | INTRAVENOUS | Status: DC

## 2015-11-30 MED FILL — AMLODIPINE BESYLATE 5 MG TA: 5 | 90 days supply | Qty: 180 | Fill #0

## 2015-11-30 MED FILL — LIDOCAINE 2% VISCOUS SOLN: 2 | 3 days supply | Qty: 100 | Fill #0

## 2015-11-30 MED FILL — SUCRALFATE 1 GM TABLET: 1 | 7 days supply | Qty: 30 | Fill #0

## 2015-11-30 NOTE — Discharge Summary (Signed)
Spent 45 minutes on discharge

## 2015-11-30 NOTE — Progress Notes (Signed)
Debra Farrell is here for her 11th fraction of radiation to her Neck, Tongue, Oropharynx. She was just discharged from the hospital today, related to an abscess around her feeding tube site. She is on oral vancomycin to treat the infection. She is instilling 240-270 ml of Osmolite 4 times a day with water flushed of 20 ml before and after tube feedings. She is to instill or swallow 1200 ml of free water daily per her discharge instructions. [facility] will visit her tonight at home with supplies and instructions related to her tube and antibiotic. She is having diarrhea related to the oral vancomycin about twice daily. Her PEG tube site is clean dry and intact. She has a dressing to the right of her PEG which is clean and intact. She also has an abscess drain in place underneath the dressing. The skin to her neck is normal appearing, she has not been using her cream while in the hospital, but will start tonight. Her weight has decreased since she was weighed in the hospital this morning. She reports there was multiple pillows and bedding, and other items on the bed to increase her weight.   BP 130/70 mmHg  Pulse 91  Temp(Src) 98.6 F (37 C)  Ht '5\' 6"'$  (1.676 m)  Wt 153 lb 11.2 oz (69.718 kg)  BMI 24.82 kg/m2  SpO2 100%   Wt Readings from Last 3 Encounters:  11/30/15 153 lb 11.2 oz (69.718 kg)  11/30/15 161 lb (73.029 kg)  11/23/15 160 lb (72.576 kg)

## 2015-11-30 NOTE — Progress Notes (Signed)
Patient discharged home, all discharge medications and instructions reviewed and questions answered.  Patient to be discharged with [facility] access left in place due to IV antibiotics at home. PAC was re-accessed in the hospital on 11/27/15 and not due to re-access until 12/04/15, therefore patient opted to keep current needle in place and will be re-accessed on 12/04/15 by home health.  Patient to be assisted to vehicle by wheelchair.

## 2015-11-30 NOTE — Discharge Summary (Signed)
Addendum to my discharge summary: In addition to the instructions in her discharge summary, the patient will also get 10 mL of flush into the abdominal drain per protocol to flush the drain daily until it is removed.

## 2015-11-30 NOTE — Progress Notes (Signed)
Pharmacy Antibiotic Note  Debra Farrell is a 70 y.o. female admitted on 11/13/2015 with febrile neutropenia and pt started on cefepime.  On 3/3 Pharmacy consulted to dose vancomycin for abscess.  Plan:  Continue Vancomycin 1gm IV q12h, Vanc trough=19  Per ID will need 3 weeks of therapy  Recommend obtaining a Scr twice weekly and vanc trough at least once weekly or as clinically indicated    Height: '5\' 6"'$  (167.6 cm) Weight: 161 lb (73.029 kg) IBW/kg (Calculated) : 59.3  Temp (24hrs), Avg:99.2 F (37.3 C), Min:98.6 F (37 C), Max:99.6 F (37.6 C)   Recent Labs Lab 11/24/15 0545 11/24/15 1622 11/25/15 0520 11/26/15 0530 11/27/15 0600 11/28/15 0513 11/29/15 0622 11/30/15 0706  WBC 2.8* 2.0* 1.9* 6.5 9.6  --   --   --   CREATININE 0.88  --  0.90 0.74 0.70 0.65 0.71 0.64  VANCOTROUGH  --   --   --   --   --   --   --  19    Estimated Creatinine Clearance: 68.9 mL/min (by C-G formula based on Cr of 0.64).    Allergies  Allergen Reactions  . Morphine And Related Nausea And Vomiting    Antimicrobials this admission: 2/27 Levaquin >> 2/28 2/28 Cefepime >> 3/5 3/3 vancomycin >>   Dose adjustments this admission: 3/6 vanc trough =19  Microbiology results: 2/26 BCx: NG x 4days 2/28 BCx: NG x 2 days 2/28 BCx (peripheral line):  3/1 Abscess: MRSA   Thank you for allowing pharmacy to be a part of this patient's care.  Dolly Rias RPh 11/30/2015, 8:22 AM Pager [phone]

## 2015-11-30 NOTE — Discharge Summary (Signed)
Physician Discharge Summary  Patient ID: Debra Farrell MRN: [id number] DOB/AGE: [birth year] 70 y.o.  Admit date: 11/13/2015 Discharge date: 11/30/2015  Primary Care Physician:  Rodrigo Ran, MD   Discharge Diagnoses:   Cancer of base of tongue (HCC) Intractable nausea and vomiting Gastritis, acute Hyponatremia Acute renal failure Hypomagnesemia Hypokalemia Moderate to severe protein calorie malnutriton Poor oral intake Leak around feeding tube Large intra-abdominal abscess s/p CT guided drain placement on 11/25/15 MRSA abscess Essential Hypertension Pain at surgical incision [place] Thalassemia, acute on chronic anemia Neutropenic fever Discharge Medications:    Medication List    STOP taking these medications        acetaminophen-codeine 300-30 MG tablet  Commonly known as:  TYLENOL #3     dexamethasone 4 MG tablet  Commonly known as:  DECADRON     hydrochlorothiazide 25 MG tablet  Commonly known as:  HYDRODIURIL     HYDROcodone-acetaminophen 5-325 MG tablet  Commonly known as:  NORCO/VICODIN     ibuprofen 200 MG tablet  Commonly known as:  ADVIL,MOTRIN     oxyCODONE-acetaminophen 10-325 MG tablet  Commonly known as:  PERCOCET      TAKE these medications        amLODipine 5 MG tablet  Commonly known as:  NORVASC  Place 1 tablet (5 mg total) into feeding tube 2 (two) times daily.     feeding supplement (OSMOLITE 1.2 CAL) Liqd  Place 237 mLs into feeding tube 4 (four) times daily.     lidocaine-prilocaine cream  Commonly known as:  EMLA  Apply to affected area once     ondansetron 8 MG tablet  Commonly known as:  ZOFRAN  Take 1 tablet (8 mg total) by mouth 2 (two) times daily as needed. Start on the third day after chemotherapy.     prochlorperazine 10 MG tablet  Commonly known as:  COMPAZINE  Take 1 tablet (10 mg total) by mouth every 6 (six) hours as needed (Nausea or vomiting).     scopolamine 1 MG/3DAYS  Commonly known as:   TRANSDERM-SCOP  Place 1 patch (1.5 mg total) onto the skin every 3 (three) days.     vancomycin 1 GM/200ML Soln  Commonly known as:  VANCOCIN  Inject 200 mLs (1,000 mg total) into the vein every 12 (twelve) hours.         Disposition and Follow-up:   Significant Diagnostic Studies:  Dg Chest 2 View  11/22/2015  CLINICAL DATA:  Dry cough for 2 days EXAM: CHEST  2 VIEW COMPARISON:  10/14/2015 FINDINGS: Cardiac shadow is within normal limits. Right chest wall port is noted in satisfactory position. Bibasilar infiltrative changes are noted right greater than left. Small effusions are seen as well. No bony abnormality is noted. IMPRESSION: Bibasilar infiltrates with small effusions. Electronically Signed   By: Alcide Clever M.D.   On: 11/22/2015 17:26   Ct Abdomen Pelvis W Contrast  11/24/2015  CLINICAL DATA:  Acute onset of fever and leukopenia. Leak about the G-tube. Assess for abscess. Initial encounter. EXAM: CT ABDOMEN AND PELVIS WITH CONTRAST TECHNIQUE: Multidetector CT imaging of the abdomen and pelvis was performed using the standard protocol following bolus administration of intravenous contrast. CONTRAST:  50mL OMNIPAQUE IOHEXOL 300 MG/ML SOLN, OMNIPAQUE IOHEXOL 300 MG/ML SOLN COMPARISON:  PET/CT performed 10/14/2015, and renal ultrasound performed 11/17/2015 FINDINGS: Small bilateral pleural effusions are noted, with bibasilar opacities likely reflecting atelectasis. There is a large collection of fluid and air noted tracking along the  anterior aspect of the liver, measuring approximately 12.4 x 11.0 x 4.8 cm, with a relatively thin peripheral rind, compatible with an abscess. This rise adjacent to the body and antrum of the stomach, with mild surrounding soft tissue inflammation tracking inferiorly along the omentum. The patient's G-tube is noted within the stomach. The stomach is largely decompressed. Stones are noted within the gallbladder. The gallbladder is otherwise unremarkable.  The pancreas and adrenal glands are grossly unremarkable. A 1.0 cm cyst is noted at the interpole region of the right kidney. The kidneys are otherwise unremarkable. There is no evidence of hydronephrosis. No renal or ureteral stones are seen. Minimal nonspecific perinephric stranding is noted bilaterally. No free fluid is identified. The small bowel is unremarkable in appearance. No acute vascular abnormalities are seen. Minimal calcification is noted along the abdominal aorta and its branches. The appendix is normal in caliber and contains contrast, without evidence of appendicitis. Contrast progresses to the level of the rectum. The colon is unremarkable in appearance. Apparent mild wall thickening at the rectum is thought to be transient in nature. The bladder is mildly distended and grossly unremarkable. The patient is status post hysterectomy. No suspicious adnexal masses are seen. The ovaries are relatively symmetric. Scattered postoperative change is noted within the pelvis. No inguinal lymphadenopathy is seen. No acute osseous abnormalities are identified. IMPRESSION: 1. Large abscess noted along the anterior aspect of the liver, adjacent to the body and antrum of the stomach, measuring 12.4 x 11.0 x 4.8 cm, containing fluid and air, with a peripheral rind. Mild surrounding soft tissue inflammation tracks about the antrum of the stomach and inferiorly along the omentum. 2. Small bilateral pleural effusions, with bibasilar airspace opacities likely reflecting atelectasis. 3. G-tube remains within the stomach. The stomach is largely decompressed. 4. Cholelithiasis.  Gallbladder otherwise unremarkable. 5. Small right renal cyst seen. These results were called by telephone at the time of interpretation on 11/24/2015 at 10:14 pm to [facility], who verbally acknowledged these results. Electronically Signed   By: Roanna Raider M.D.   On: 11/24/2015 22:15   Ir Gastrostomy Tube  11/05/2015   CLINICAL DATA:  Base of tongue carcinoma, needs enteral feeding support EXAM: PERC PLACEMENT GASTROSTOMY FLUOROSCOPY TIME:  102 seconds, 21 mGy TECHNIQUE: The procedure, risks, benefits, and alternatives were explained to the patient. Questions regarding the procedure were encouraged and answered. The patient understands and consents to the procedure. Patient had received adequate prophylactic antibiotic coverage with than 1 hour procedure.Progression of previously administered oral barium into the colon was confirmed fluoroscopically. A 5 French angiographic catheter was placed as orogastric tube. The upper abdomen was prepped with Betadine, draped in usual sterile fashion, and infiltrated locally with 1% lidocaine. Intravenous Fentanyl and Versed were administered as conscious sedation during continuous monitoring of the patient's level of consciousness and physiological / cardiorespiratory status by the radiology RN, with a total moderate sedation time of 12 minutes. Stomach was insufflated using air through the orogastric tube. An 87 French sheath needle was advanced percutaneously into the gastric lumen under fluoroscopy. Gas could be aspirated and a small contrast injection confirmed intraluminal spread. The sheath was exchanged over a guidewire for a 9 [place] vascular sheath, through which the snare device was advanced and used to snare a guidewire passed through the orogastric tube. This was withdrawn, and the snare attached to the 20 French pull-through gastrostomy tube, which was advanced antegrade, positioned with the internal bumper securing the anterior gastric  wall to the anterior abdominal wall. Small contrast injection confirms appropriate positioning. The external bumper was applied and the catheter was flushed. COMPLICATIONS: COMPLICATIONS none IMPRESSION: 1. Technically successful 20 French pull-through gastrostomy placement under fluoroscopy. Electronically Signed   By: Corlis Leak M.D.   On:  11/05/2015 15:23   US Renal  11/17/2015  CLINICAL DATA:  Acute renal failure EXAM: RENAL / URINARY TRACT ULTRASOUND COMPLETE COMPARISON:  None. FINDINGS: Right Kidney: Length: 13.7 cm. Echogenicity grossly within normal limits. No hydronephrosis. 12 mm cyst identified in the interpolar region. Left Kidney: Length: 11.9 cm. Echogenicity within normal limits. No mass or hydronephrosis visualized. Bladder: Appears normal for degree of bladder distention. IMPRESSION: No evidence for hydronephrosis. Electronically Signed   By: Kennith Center M.D.   On: 11/17/2015 19:57   Ir Fluoro Guide Cv Line Right  11/05/2015  CLINICAL DATA:  Carcinoma base of tongue, needs long-term IV access for chemotherapy. EXAM: TUNNELED PORT CATHETER PLACEMENT WITH ULTRASOUND AND FLUOROSCOPIC GUIDANCE FLUOROSCOPY TIME:  18 seconds, 3 mGy ANESTHESIA/SEDATION: Intravenous Fentanyl and Versed were administered as conscious sedation during continuous monitoring of the patient's level of consciousness and physiological / cardiorespiratory status by the radiology RN, with a total moderate sedation time of 16 minutes. TECHNIQUE: The procedure, risks, benefits, and alternatives were explained to the patient. Questions regarding the procedure were encouraged and answered. The patient understands and consents to the procedure. As antibiotic prophylaxis, cefazolin 2 g was ordered pre-procedure and administered intravenously within one hour of incision. Patency of the right IJ vein was confirmed with ultrasound with image documentation. An appropriate skin site was determined. Skin site was marked. Region was prepped using maximum barrier technique including cap and mask, sterile gown, sterile gloves, large sterile sheet, and Chlorhexidine as cutaneous antisepsis. The region was infiltrated locally with 1% lidocaine. Under real-time ultrasound guidance, the right IJ vein was accessed with a 21 gauge micropuncture needle; the needle tip within the vein  was confirmed with ultrasound image documentation. Needle was exchanged over a 018 guidewire for transitional dilator which allowed passage of the [facility] wire into the IVC. Over this, the transitional dilator was exchanged for a 5 [place] MPA catheter. A small incision was made on the right anterior chest wall and a subcutaneous pocket fashioned. The power-injectable port was positioned and its catheter tunneled to the right IJ dermatotomy site. The MPA catheter was exchanged over an Amplatz wire for a peel-away sheath, through which the port catheter, which had been trimmed to the appropriate length, was advanced and positioned under fluoroscopy with its tip at the cavoatrial junction. Spot chest radiograph confirms good catheter position and no pneumothorax. The pocket was closed with deep interrupted and subcuticular continuous 3-0 Monocryl sutures. The port was flushed per protocol. The incisions were covered with Dermabond then covered with a sterile dressing. COMPLICATIONS: COMPLICATIONS None immediate IMPRESSION: Technically successful right IJ power-injectable port catheter placement. Ready for routine use. Electronically Signed   By: Corlis Leak M.D.   On: 11/05/2015 15:22   Ir Cm Inj Any Colonic Tube W/fluoro  11/23/2015  INDICATION: Percutaneous gastrostomy tube placed 11/05/2015. Concern for possible leak. EXAM: GI TUBE INJECTION MEDICATIONS: None; Antibiotics were administered within 1 hour of the procedure. Glucagon 1 mg IV ANESTHESIA/SEDATION: Moderate Sedation Time:  None The patient was continuously monitored during the procedure by the interventional radiology nurse under my direct supervision. CONTRAST:  10 mL Omnipaque 300 - administered into the gastric lumen. FLUOROSCOPY TIME:  Fluoroscopy Time: 6 seconds  COMPLICATIONS: None immediate. PROCEDURE: Informed written consent was obtained from the patient after a thorough discussion of the procedural risks, benefits and alternatives. All questions  were addressed. Maximal Sterile Barrier Technique was utilized including caps, mask, sterile gowns, sterile gloves, sterile drape, hand hygiene and skin antiseptic. A timeout was performed prior to the initiation of the procedure. Gastrostomy tube was inspected and found to be without obvious defects. The tube was then injected with approximately 10 cc of contrast which promptly filled the gastric lumen. Several images were taken confirming no evidence of leak or obstruction. Gastrostomy tube is in proper position. IMPRESSION: Properly position the gastrostomy tube without evidence of leak. Read by: Brayton El PA-C Electronically Signed   By: Jolaine Click M.D.   On: 11/23/2015 13:00   Ir US Guide Vasc Access Right  11/05/2015  CLINICAL DATA:  Carcinoma base of tongue, needs long-term IV access for chemotherapy. EXAM: TUNNELED PORT CATHETER PLACEMENT WITH ULTRASOUND AND FLUOROSCOPIC GUIDANCE FLUOROSCOPY TIME:  18 seconds, 3 mGy ANESTHESIA/SEDATION: Intravenous Fentanyl and Versed were administered as conscious sedation during continuous monitoring of the patient's level of consciousness and physiological / cardiorespiratory status by the radiology RN, with a total moderate sedation time of 16 minutes. TECHNIQUE: The procedure, risks, benefits, and alternatives were explained to the patient. Questions regarding the procedure were encouraged and answered. The patient understands and consents to the procedure. As antibiotic prophylaxis, cefazolin 2 g was ordered pre-procedure and administered intravenously within one hour of incision. Patency of the right IJ vein was confirmed with ultrasound with image documentation. An appropriate skin site was determined. Skin site was marked. Region was prepped using maximum barrier technique including cap and mask, sterile gown, sterile gloves, large sterile sheet, and Chlorhexidine as cutaneous antisepsis. The region was infiltrated locally with 1% lidocaine. Under real-time  ultrasound guidance, the right IJ vein was accessed with a 21 gauge micropuncture needle; the needle tip within the vein was confirmed with ultrasound image documentation. Needle was exchanged over a 018 guidewire for transitional dilator which allowed passage of the [facility] wire into the IVC. Over this, the transitional dilator was exchanged for a 5 [place] MPA catheter. A small incision was made on the right anterior chest wall and a subcutaneous pocket fashioned. The power-injectable port was positioned and its catheter tunneled to the right IJ dermatotomy site. The MPA catheter was exchanged over an Amplatz wire for a peel-away sheath, through which the port catheter, which had been trimmed to the appropriate length, was advanced and positioned under fluoroscopy with its tip at the cavoatrial junction. Spot chest radiograph confirms good catheter position and no pneumothorax. The pocket was closed with deep interrupted and subcuticular continuous 3-0 Monocryl sutures. The port was flushed per protocol. The incisions were covered with Dermabond then covered with a sterile dressing. COMPLICATIONS: COMPLICATIONS None immediate IMPRESSION: Technically successful right IJ power-injectable port catheter placement. Ready for routine use. Electronically Signed   By: Corlis Leak M.D.   On: 11/05/2015 15:22   Dg Abd 2 Views  11/14/2015  CLINICAL DATA:  70 year old female with 2- 3 day history of nausea/vomiting after chemotherapy for tongue cancer EXAM: ABDOMEN - 2 VIEW COMPARISON:  Prior PET-CT 11/05/2015 FINDINGS: Nonobstructive bowel gas pattern. Gastrostomy tube in good position overlying the gastric bubble. No acute bony abnormality. No evidence of free air on the decubitus view. Chronic parenchymal changes in the visualized lower lobes of the lungs. IMPRESSION: No evidence of bowel obstruction or free air. Gastrostomy tube appears  to be in good position. Electronically Signed   By: Malachy Moan M.D.   On:  11/14/2015 10:16   Ct Image Guided Drainage By Percutaneous Catheter  11/25/2015  CLINICAL DATA:  Peritoneal abscess post gastrostomy tube placement EXAM: CT GUIDED DRAINAGE OF PERITONEAL ABSCESS ANESTHESIA/SEDATION: Intravenous Fentanyl and Versed were administered as conscious sedation during continuous monitoring of the patient's level of consciousness and physiological / cardiorespiratory status by the radiology RN, with a total moderate sedation time of 12 minutes. PROCEDURE: The procedure, risks, benefits, and alternatives were explained to the patient. Questions regarding the procedure were encouraged and answered. The patient understands and consents to the procedure. Select axial scans through the mid abdomen obtained. The collection was localized an appropriate skin entry site determined and marked. The operative field was prepped with chlorhexidinein a sterile fashion, and a sterile drape was applied covering the operative field. A sterile gown and sterile gloves were used for the procedure. Local anesthesia was provided with 1% Lidocaine. Under CT fluoroscopic guidance, a 19 gauge percutaneous entry needle was advanced into the collection. Thin purulent material could be aspirated. Amplatz wire advanced easily within the collection, position confirmed on CT. Tract dilated to the to facilitate placement of a 12 French pigtail catheter, placed within the central dependent aspect of the collection. Approximately 40 mL of thin purulent fluid were aspirated, a sample sent for Gram stain and culture. The catheter was secured externally with 0 Prolene suture and StatLock and placed to gravity bag. The patient tolerated the procedure well. COMPLICATIONS: None immediate FINDINGS: CT confirms a loculated gas and fluid collection anterior to the left lobe of the liver in the midline. Abscess drain catheter placed under CT fluoroscopic guidance as above. IMPRESSION: 1. Technically successful CT-guided peritoneal  abscess drain catheter placement. Electronically Signed   By: Corlis Leak M.D.   On: 11/25/2015 16:32    Discharge Laboratory Values: Lab Results  Component Value Date   WBC 9.6 11/27/2015   HGB 9.5* 11/27/2015   HCT 28.8* 11/27/2015   MCV 67.4* 11/27/2015   PLT 314 11/27/2015   Lab Results  Component Value Date   NA 130* 11/30/2015   K 3.9 11/30/2015   CL 98* 11/30/2015   CO2 27 11/30/2015    Brief H and P: For complete details please refer to admission H and P, but in brief, The patient was admitted to the hospital due to intractable nausea and vomiting secondary to side effects of chemotherapy. Subsequently, she was found to have MRSA intra-abdominal abscess requiring placement of drain and IV antibiotics.  Physical Exam at Discharge: BP 140/61 mmHg  Pulse 83  Temp(Src) 98.6 F (37 C) (Oral)  Resp 16  Ht 5\' 6"  (1.676 m)  Wt 161 lb (73.029 kg)  BMI 26.00 kg/m2  SpO2 98% GENERAL:alert, no distress and comfortable SKIN: skin color, texture, turgor are normal, no rashes or significant lesions EYES: normal, Conjunctiva are pink and non-injected, sclera clear OROPHARYNX:no exudate, no erythema and lips, buccal mucosa, and tongue normal  NECK: supple, thyroid normal size, non-tender, without nodularity LYMPH: no palpable lymphadenopathy in the cervical, axillary or inguinal LUNGS: clear to auscultation and percussion with normal breathing effort HEART: regular rate & rhythm and no murmurs and no lower extremity edema ABDOMEN:abdomen soft, non-tender and normal bowel sounds. Feeding tube in situ and drain in situ Musculoskeletal:no cyanosis of digits and no clubbing  NEURO: alert & oriented x 3 with fluent speech, no focal motor/sensory deficits  Hospital Course:  Active Problems:   Cancer of base of tongue (HCC)   Intractable nausea and vomiting   Gastritis, acute   Severe protein-calorie malnutrition (HCC)   Acute renal failure (ARF) (HCC)   Dehydration    Hypokalemia   Hypomagnesemia   Hypophosphatemia   Anemia due to other cause   Renal failure   Fever   Abdominal pain   Abscess   Neutropenic fever (HCC)  Cancer of base of tongue (HCC) Unfortunately, she developed intractable nausea and vomiting, multifactorial, could be related to side effects of chemotherapy, compounded by severe gastritis and recent constipation Continue supportive care and radiation treatment We have made informed decision to discontinue future chemotherapy due to multiple complications  Intractable nausea and vomiting, resolved This was related to side effects of treatment  Continue scopolamine patch only  Gastritis, acute, resolved She has severe acute gastritis, exacerbating her nausea and vomiting. She had Protonix, discontinued upon discharge  Hyponatremia, stable Acute renal failure, resolved Due to dehydration and recent cisplatin Monitor twice a week while on vancomycin in the out-patient setting  Hypomagnesemia Hypokalemia, resolved Renal wasting from cisplatin, poor oral intake and side effects of cefepime Replacement therapy is initiated We'll monitor closely in the clinic setting  Moderate to severe protein calorie malnutriton Poor oral intake, improving Consulted nutritionist, plan to go home on Osmolite Advancing to regular diet and intermittent bolus feeding, to follow nutritionist in the out-patient setting  Leak around feeding tube, resolved Large intra-abdominal abscess s/p CT guided drain placement on 11/25/15 MRSA abscess, on IV vancomycin since 11/27/15, planned for 3 weeks total duration of IV vancomycin She has successful placement of drainage tube to the fluid collection/abscess. Fever had resolved. Cultures positive for MRSA. I have consulted infectious disease consultant to review her antibiotic therapy and to tailor her regimen. Ultimately planned for 3 weeks total course of vancomycin until 12/18/15 She will follow with IR for  possible drain removal in the future  Essential Hypertension Was on HCTZ at home/baseline. Due to dehydration, this is stopped Started on amlodipine, added hydralazine Planned to go home on amlodipine  Pain at surgical incision, resolved Her pain has improved with IV Dilaudid. Discontinue pain medicine due to recent falls  Thalassemia, acute on chronic anemia Received blood transfusion on 11/15/15 and 11/25/15 due to bone marrow suppression  Neutropenic fever, resolved This is related to ongoing treatment and intra-abdominal abscess She received Granix x 1 on 11/25/15, with resolution of neutropenia  DVT prophylaxis On Lovenox  Discharge planning  Home today with home health care and [facility]  Diet:  Regular and tube feeding  Activity:  Up as tolerated  Condition at Discharge:   stable  Signed: Dr. Artis Delay [phone]  11/30/2015, 8:48 AM

## 2015-11-30 NOTE — Telephone Encounter (Signed)
lvm for pt regarding to March appt.... °

## 2015-11-30 NOTE — Progress Notes (Addendum)
   Weekly Management Note:  Outpatient    ICD-9-CM ICD-10-CM   1. 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  CBCT/MVCT images/Port film x-rays were reviewed.  The chart was checked. Doing much better, back home, on ABX for an abscess r/t leaking PEG tube. Tube now functional again. Chemo on hold  Physical Findings:  Wt Readings from Last 3 Encounters:  11/30/15 153 lb 11.2 oz (69.718 kg)  11/30/15 161 lb (73.029 kg)  11/23/15 160 lb (72.576 kg)    height is '5\' 6"'$  (1.676 m) and weight is 153 lb 11.2 oz (69.718 kg). Her temperature is 98.6 F (37 C). Her blood pressure is 130/70 and her pulse is 91. Her oxygen saturation is 100%.  No mucositis or skin irritation thus far.  + right neck mass  CBC    Component Value Date/Time   WBC 9.6 11/27/2015 0600   RBC 4.27 11/27/2015 0600   RBC 3.97 11/15/2015 0700   HGB 9.5* 11/27/2015 0600   HCT 28.8* 11/27/2015 0600   PLT 314 11/27/2015 0600   MCV 67.4* 11/27/2015 0600   MCH 22.2* 11/27/2015 0600   MCHC 33.0 11/27/2015 0600   RDW 22.4* 11/27/2015 0600   LYMPHSABS 1.1 11/27/2015 0600   MONOABS 0.6 11/27/2015 0600   EOSABS 0.0 11/27/2015 0600   BASOSABS 0.0 11/27/2015 0600     CMP     Component Value Date/Time   NA 130* 11/30/2015 0706   K 3.9 11/30/2015 0706   CL 98* 11/30/2015 0706   CO2 27 11/30/2015 0706   GLUCOSE 110* 11/30/2015 0706   BUN 19 11/30/2015 0706   CREATININE 0.64 11/30/2015 0706   CREATININE 1.04 04/07/2015 1536   CALCIUM 8.3* 11/30/2015 0706   PROT 6.0* 11/30/2015 0706   ALBUMIN 2.3* 11/30/2015 0706   AST 25 11/30/2015 0706   ALT 27 11/30/2015 0706   ALKPHOS 87 11/30/2015 0706   BILITOT 0.6 11/30/2015 0706   GFRNONAA >60 11/30/2015 0706   GFRAA >60 11/30/2015 0706     Impression:  The patient is tolerating  radiotherapy.   Plan:  Continue radiotherapy as planned.     Rx as above -----------------------------------  [name], MD

## 2015-12-01 ENCOUNTER — Telehealth: Payer: Self-pay | Admitting: *Deleted

## 2015-12-01 ENCOUNTER — Ambulatory Visit
Admission: RE | Admit: 2015-12-01 | Discharge: 2015-12-01 | Disposition: A | Discharge status: Home / Self Care | Payer: 59 | Source: Ambulatory Visit | Attending: Radiation Oncology | Admitting: Radiation Oncology

## 2015-12-01 ENCOUNTER — Ambulatory Visit (HOSPITAL_COMMUNITY): Payer: Self-pay | Admitting: Dentistry

## 2015-12-01 ENCOUNTER — Encounter: Payer: Self-pay | Admitting: Hematology and Oncology

## 2015-12-01 ENCOUNTER — Other Ambulatory Visit: Payer: Self-pay

## 2015-12-01 ENCOUNTER — Ambulatory Visit: Payer: 59

## 2015-12-01 ENCOUNTER — Encounter (HOSPITAL_COMMUNITY): Payer: Self-pay | Admitting: Dentistry

## 2015-12-01 VITALS — BP 137/64 | HR 82 | Temp 98.6°F | Wt 153.0 lb

## 2015-12-01 DIAGNOSIS — K0820 Unspecified atrophy of edentulous alveolar ridge: Secondary | ICD-10-CM

## 2015-12-01 DIAGNOSIS — R432 Parageusia: Secondary | ICD-10-CM

## 2015-12-01 DIAGNOSIS — Z510 Encounter for antineoplastic radiation therapy: Secondary | ICD-10-CM | POA: Diagnosis not present

## 2015-12-01 DIAGNOSIS — R682 Dry mouth, unspecified: Secondary | ICD-10-CM

## 2015-12-01 DIAGNOSIS — R5081 Fever presenting with conditions classified elsewhere: Secondary | ICD-10-CM | POA: Diagnosis not present

## 2015-12-01 DIAGNOSIS — E785 Hyperlipidemia, unspecified: Secondary | ICD-10-CM | POA: Diagnosis not present

## 2015-12-01 DIAGNOSIS — C01 Malignant neoplasm of base of tongue: Secondary | ICD-10-CM | POA: Diagnosis not present

## 2015-12-01 DIAGNOSIS — D563 Thalassemia minor: Secondary | ICD-10-CM | POA: Diagnosis not present

## 2015-12-01 DIAGNOSIS — L0291 Cutaneous abscess, unspecified: Secondary | ICD-10-CM | POA: Diagnosis not present

## 2015-12-01 DIAGNOSIS — R1310 Dysphagia, unspecified: Secondary | ICD-10-CM

## 2015-12-01 DIAGNOSIS — L309 Dermatitis, unspecified: Secondary | ICD-10-CM | POA: Diagnosis not present

## 2015-12-01 DIAGNOSIS — E43 Unspecified severe protein-calorie malnutrition: Secondary | ICD-10-CM | POA: Diagnosis not present

## 2015-12-01 DIAGNOSIS — Z0189 Encounter for other specified special examinations: Secondary | ICD-10-CM

## 2015-12-01 DIAGNOSIS — I10 Essential (primary) hypertension: Secondary | ICD-10-CM | POA: Diagnosis not present

## 2015-12-01 DIAGNOSIS — K117 Disturbances of salivary secretion: Secondary | ICD-10-CM

## 2015-12-01 DIAGNOSIS — K08109 Complete loss of teeth, unspecified cause, unspecified class: Secondary | ICD-10-CM

## 2015-12-01 DIAGNOSIS — Z809 Family history of malignant neoplasm, unspecified: Secondary | ICD-10-CM | POA: Diagnosis not present

## 2015-12-01 NOTE — Telephone Encounter (Signed)
Letter from Dr Alvy Bimler with lab orders faxed to [facility]

## 2015-12-01 NOTE — Progress Notes (Signed)
12/01/2015  Patient Name:   Debra Farrell Date of Birth:   12-Aug-1946 Medical Record Number: [id number]  BP 137/64 mmHg  Pulse 82  Temp(Src) 98.6 F (37 C) (Oral)  Wt 153 lb (69.4 kg)   Natonya E Esson presents for oral examination during chemoradiation therapy. Patient has completed 12/35 radiation treatments. Patient had 1 chemotherapy doses cis-platinum with extensive side effects. Chemotherapy has been discontinued due to multiple complications including intractable nausea and vomiting and kidney toxicity. Patient had complication with feeding tube which required hospitalization and is currently on IV vancomycin for an additional 3 weeks. Patient has missed several radiation therapy appointments and is attempting to catch up treatments during the next several weeks.  REVIEW OF CHIEF COMPLAINTS: DRY MOUTH: Yes, but not too bad. HARD TO SWALLOW: Yes, at times.  HURT TO SWALLOW: Yes, at times. TASTE CHANGES: Taste is going away. SORES IN MOUTH: No TRISMUS: No problems with trismus. WEIGHT: 153 lbs.  HOME OH REGIMEN:  BRUSHING: Not applicable FLOSSING: Not applicable RINSING: Using salt water and baking soda rinses. FLUORIDE: Not applicable.  TRISMUS EXERCISES:  Maximum interincisal opening: 42 mm  DENTAL EXAM:  Oral Hygiene:(PLAQUE): Patient is edentulous. Patient reminded to brush her tongue daily. LOCATION OF MUCOSITIS: Back of her throat is erythematous.  DESCRIPTION OF SALIVA: Decreased saliva. Incipient xerostomia. ANY EXPOSED BONE: None noted OTHER WATCHED AREAS: Previous extraction sites. DX:  1. Xerostomia 2. Dysgeusia 3. Dysphagia 4. Odynophagia 5. Edentulous 6. Atrophy of edentulous alveolar ridges.  RECOMMENDATIONS: 1. Brush tongue daily.  2. Use trismus exercises as directed. 3. Use Biotene Rinse or salt water/baking soda rinses. 4. Multiple sips of water as needed. 5. Return to clinic in two months for oral exam after radiation therapy. Ideally will  need to have patient referred to a prosthodontist for fabrication of upper and lower complete dentures +/- implant therapy. Call if problems before then.  Lenn Cal, DDS

## 2015-12-01 NOTE — Telephone Encounter (Signed)
Called patient to inform of nutrition appt. On 12-04-15 @ 1:30 pm, spoke with patient and she is aware of this appt.

## 2015-12-01 NOTE — Patient Instructions (Addendum)
RECOMMENDATIONS: 1. Brush tongue daily.  2. Use trismus exercises as directed. 3. Use Biotene Rinse or salt water/baking soda rinses. 4. Multiple sips of water as needed. 5. Return to clinic in two months for oral exam after radiation therapy. Ideally will need to have patient referred to a prosthodontist for fabrication of upper and lower complete dentures +/- implant therapy. Call if problems before then.  Lenn Cal, DDS

## 2015-12-02 ENCOUNTER — Ambulatory Visit
Admission: RE | Admit: 2015-12-02 | Discharge: 2015-12-02 | Disposition: A | Discharge status: Home / Self Care | Payer: 59 | Source: Ambulatory Visit | Attending: Radiation Oncology | Admitting: Radiation Oncology

## 2015-12-02 ENCOUNTER — Ambulatory Visit: Payer: Self-pay | Admitting: Hematology and Oncology

## 2015-12-02 ENCOUNTER — Other Ambulatory Visit: Payer: Self-pay

## 2015-12-02 ENCOUNTER — Ambulatory Visit: Payer: 59

## 2015-12-02 ENCOUNTER — Other Ambulatory Visit: Payer: Self-pay | Admitting: *Deleted

## 2015-12-02 ENCOUNTER — Ambulatory Visit: Payer: Self-pay

## 2015-12-02 ENCOUNTER — Telehealth: Payer: Self-pay | Admitting: Hematology and Oncology

## 2015-12-02 ENCOUNTER — Other Ambulatory Visit: Payer: Self-pay | Admitting: Hematology and Oncology

## 2015-12-02 ENCOUNTER — Encounter: Payer: Self-pay | Admitting: Nutrition

## 2015-12-02 DIAGNOSIS — I10 Essential (primary) hypertension: Secondary | ICD-10-CM | POA: Diagnosis not present

## 2015-12-02 DIAGNOSIS — L309 Dermatitis, unspecified: Secondary | ICD-10-CM | POA: Diagnosis not present

## 2015-12-02 DIAGNOSIS — C01 Malignant neoplasm of base of tongue: Secondary | ICD-10-CM | POA: Diagnosis not present

## 2015-12-02 DIAGNOSIS — D563 Thalassemia minor: Secondary | ICD-10-CM | POA: Diagnosis not present

## 2015-12-02 DIAGNOSIS — L0291 Cutaneous abscess, unspecified: Secondary | ICD-10-CM

## 2015-12-02 DIAGNOSIS — E785 Hyperlipidemia, unspecified: Secondary | ICD-10-CM | POA: Diagnosis not present

## 2015-12-02 DIAGNOSIS — Z510 Encounter for antineoplastic radiation therapy: Secondary | ICD-10-CM | POA: Diagnosis not present

## 2015-12-02 DIAGNOSIS — Z809 Family history of malignant neoplasm, unspecified: Secondary | ICD-10-CM | POA: Diagnosis not present

## 2015-12-02 NOTE — Telephone Encounter (Signed)
cld & spoke to pt and gave pt time & date of appt for 3/9

## 2015-12-03 ENCOUNTER — Ambulatory Visit: Payer: 59

## 2015-12-03 ENCOUNTER — Other Ambulatory Visit: Payer: Self-pay

## 2015-12-03 ENCOUNTER — Ambulatory Visit
Admission: RE | Admit: 2015-12-03 | Discharge: 2015-12-03 | Disposition: A | Discharge status: Home / Self Care | Payer: 59 | Source: Ambulatory Visit | Attending: Radiation Oncology | Admitting: Radiation Oncology

## 2015-12-03 ENCOUNTER — Other Ambulatory Visit (HOSPITAL_BASED_OUTPATIENT_CLINIC_OR_DEPARTMENT_OTHER): Payer: 59

## 2015-12-03 ENCOUNTER — Other Ambulatory Visit: Payer: Self-pay | Admitting: Hematology and Oncology

## 2015-12-03 ENCOUNTER — Other Ambulatory Visit: Payer: Self-pay | Admitting: *Deleted

## 2015-12-03 ENCOUNTER — Encounter: Payer: Self-pay | Admitting: Hematology and Oncology

## 2015-12-03 ENCOUNTER — Ambulatory Visit (HOSPITAL_BASED_OUTPATIENT_CLINIC_OR_DEPARTMENT_OTHER): Payer: 59 | Admitting: Hematology and Oncology

## 2015-12-03 ENCOUNTER — Telehealth: Payer: Self-pay | Admitting: Hematology and Oncology

## 2015-12-03 VITALS — BP 127/62 | HR 90 | Temp 98.3°F | Resp 17 | Ht 66.0 in | Wt 149.6 lb

## 2015-12-03 DIAGNOSIS — A4902 Methicillin resistant Staphylococcus aureus infection, unspecified site: Secondary | ICD-10-CM

## 2015-12-03 DIAGNOSIS — I10 Essential (primary) hypertension: Secondary | ICD-10-CM | POA: Diagnosis not present

## 2015-12-03 DIAGNOSIS — L309 Dermatitis, unspecified: Secondary | ICD-10-CM | POA: Diagnosis not present

## 2015-12-03 DIAGNOSIS — E785 Hyperlipidemia, unspecified: Secondary | ICD-10-CM | POA: Diagnosis not present

## 2015-12-03 DIAGNOSIS — Z809 Family history of malignant neoplasm, unspecified: Secondary | ICD-10-CM | POA: Diagnosis not present

## 2015-12-03 DIAGNOSIS — E43 Unspecified severe protein-calorie malnutrition: Secondary | ICD-10-CM

## 2015-12-03 DIAGNOSIS — Z510 Encounter for antineoplastic radiation therapy: Secondary | ICD-10-CM | POA: Diagnosis not present

## 2015-12-03 DIAGNOSIS — D563 Thalassemia minor: Secondary | ICD-10-CM | POA: Diagnosis not present

## 2015-12-03 DIAGNOSIS — K651 Peritoneal abscess: Secondary | ICD-10-CM | POA: Diagnosis not present

## 2015-12-03 DIAGNOSIS — K9423 Gastrostomy malfunction: Secondary | ICD-10-CM | POA: Insufficient documentation

## 2015-12-03 DIAGNOSIS — C01 Malignant neoplasm of base of tongue: Secondary | ICD-10-CM | POA: Diagnosis not present

## 2015-12-03 DIAGNOSIS — IMO0002 Reserved for concepts with insufficient information to code with codable children: Secondary | ICD-10-CM

## 2015-12-03 DIAGNOSIS — D6489 Other specified anemias: Secondary | ICD-10-CM

## 2015-12-03 LAB — CBC WITH DIFFERENTIAL/PLATELET
BASO%: 0.4 % (ref 0.0–2.0)
Basophils Absolute: 0 10*3/uL (ref 0.0–0.1)
EOS%: 0.5 % (ref 0.0–7.0)
Eosinophils Absolute: 0 10*3/uL (ref 0.0–0.5)
HEMATOCRIT: 30.5 % — AB (ref 34.8–46.6)
HGB: 9.6 g/dL — ABNORMAL LOW (ref 11.6–15.9)
LYMPH#: 0.7 10*3/uL — AB (ref 0.9–3.3)
LYMPH%: 12.5 % — ABNORMAL LOW (ref 14.0–49.7)
MCH: 21 pg — ABNORMAL LOW (ref 25.1–34.0)
MCHC: 31.5 g/dL (ref 31.5–36.0)
MCV: 66.7 fL — ABNORMAL LOW (ref 79.5–101.0)
MONO#: 0.7 10*3/uL (ref 0.1–0.9)
MONO%: 12.8 % (ref 0.0–14.0)
NEUT%: 73.8 % (ref 38.4–76.8)
NEUTROS ABS: 4.3 10*3/uL (ref 1.5–6.5)
PLATELETS: 447 10*3/uL — AB (ref 145–400)
RBC: 4.58 10*6/uL (ref 3.70–5.45)
RDW: 25.3 % — ABNORMAL HIGH (ref 11.2–14.5)
WBC: 5.8 10*3/uL (ref 3.9–10.3)

## 2015-12-03 LAB — COMPREHENSIVE METABOLIC PANEL
ALT: 18 U/L (ref 0–55)
ANION GAP: 8 meq/L (ref 3–11)
AST: 17 U/L (ref 5–34)
Albumin: 2.7 g/dL — ABNORMAL LOW (ref 3.5–5.0)
Alkaline Phosphatase: 108 U/L (ref 40–150)
BILIRUBIN TOTAL: 0.46 mg/dL (ref 0.20–1.20)
BUN: 21.3 mg/dL (ref 7.0–26.0)
CALCIUM: 9 mg/dL (ref 8.4–10.4)
CO2: 30 meq/L — AB (ref 22–29)
CREATININE: 0.9 mg/dL (ref 0.6–1.1)
Chloride: 96 mEq/L — ABNORMAL LOW (ref 98–109)
EGFR: 66 mL/min/{1.73_m2} — ABNORMAL LOW (ref >90)
Glucose: 159 mg/dl — ABNORMAL HIGH (ref 70–140)
Potassium: 4.1 mEq/L (ref 3.5–5.1)
Sodium: 134 mEq/L — ABNORMAL LOW (ref 136–145)
TOTAL PROTEIN: 6.8 g/dL (ref 6.4–8.3)

## 2015-12-03 LAB — MAGNESIUM: MAGNESIUM: 1.7 mg/dL (ref 1.5–2.5)

## 2015-12-03 NOTE — Assessment & Plan Note (Signed)
She could not tolerate further chemotherapy due to recent side effects and infection. She will continue radiation therapy only and I will continue to provide supportive care.

## 2015-12-03 NOTE — Assessment & Plan Note (Signed)
She has lost a lot of weight. She have a lack of taste sensation. I reinforced the importance of oral intake as tolerated and increased nutritional supplement through the feeding tube as tolerated. She will follow with dietitian tomorrow

## 2015-12-03 NOTE — Progress Notes (Signed)
[facility] OFFICE PROGRESS NOTE  Patient Care Team: [name], MD as PCP - General [name], RN as Oncology Nurse Navigator [name], MD as Consulting Physician (Hematology and Oncology) [name], MD as Attending Physician (Radiation Oncology) [name], RD as Dietitian (Nutrition)  SUMMARY OF ONCOLOGIC HISTORY: Oncology History   Cancer of base of tongue [facility])   Staging form: Lip and Oral Cavity, AJCC 7th Edition     Clinical stage from 10/14/2015: Stage IVA (T2, N2b, M0) - Signed by [name], MD on 10/19/2015       Cancer of base of tongue ([place])   09/29/2015 Pathology Results Accession: [id number] FNA right neck showed necrotic cells suspicious for squamous cell cancer   09/29/2015 Pathology Results Accession: [id number] Mouth biopsy showed pyogenic granuloma   10/08/2015 Imaging CT neck showed 1. 3 cm right tongue base mass consistent with malignancy.2. Necrotic right level II and III nodal metastases.3. Enlarged right thyroid lobe containing multiple nodules.   10/14/2015 Imaging PET scan showed 1. Hypermetabolic right tongue base mass with hypermetabolic right level II and III adenopathy. Small left level III lymph node appears mildly hypermetabolic as well.2. A few scattered tiny pulmonary nodules are too small for PETresolution   10/16/2015 Imaging [place] Head/Neck:  1) Multiple thyroid lesions bilaterally; dominant nodule in right lower pole measures 22 mm and meets fine-needle aspiration criteria. 2) Complex mass within the soft tissues of the right side of neck worrisome for metastatic malignancy.   10/26/2015 Pathology Results Accession [id number]:  Thyroid FNA - Consistent with benign follicular nodule.   10/28/2015 Surgery Multiple tooth extractions (26), right tongue base biopsy.   10/28/2015 Pathology Results Accession [id number]:  Tongue, right base -  Infiltrative SCC, p16 positive.  Actinomyces infection.   11/05/2015 Procedure Port-a-cath and PEG placed.    11/11/2015 -  Radiation Therapy She received radiation   11/11/2015 - 11/12/2015 Chemotherapy She received high dose cisplatin. Treatment was discontinued permanently due to severe side-effects   11/13/2015 Adverse Reaction She presented with intractable nausea and vomiting   11/13/2015 - 11/30/2015 Hospital Admission She was hospitalized for intractable nausea, vomiting and developed acute renal failure, reversed with aggressive IV fluid hydration. Subsequently, she was found to have intra-abdominal infection with MRSA requiring placement of drainage tube and IV ABx   11/24/2015 Imaging CT scan showed large abdominal abscess   11/25/2015 Procedure She had placement of drain    INTERVAL HISTORY: Please see below for problem oriented charting. She returns for further follow-up. Denies further nausea and vomiting. No recent fever or chills. She complained of mild drainage around the feeding tube site. Surprisingly, the dose of IV vancomycin has been changed to 1750 mg daily.  REVIEW OF SYSTEMS:   Constitutional: Denies fevers, chills or abnormal weight loss Eyes: Denies blurriness of vision Ears, nose, mouth, throat, and face: Denies mucositis or sore throat Respiratory: Denies cough, dyspnea or wheezes Cardiovascular: Denies palpitation, chest discomfort or lower extremity swelling Gastrointestinal:  Denies nausea, heartburn or change in bowel habits Skin: Denies abnormal skin rashes Lymphatics: Denies new lymphadenopathy or easy bruising Neurological:Denies numbness, tingling or new weaknesses Behavioral/Psych: Mood is stable, no new changes  All other systems were reviewed with the patient and are negative.  I have reviewed the past medical history, past surgical history, social history and family history with the patient and they are unchanged from previous note.  ALLERGIES:  is allergic to morphine and related.  MEDICATIONS:  Current Outpatient  Prescriptions  Medication Sig Dispense  Refill  . amLODipine (NORVASC) 5 MG tablet Place 1 tablet (5 mg total) into feeding tube 2 (two) times daily. 60 tablet 6  . Nutritional Supplements (FEEDING SUPPLEMENT, OSMOLITE 1.2 CAL,) LIQD Place 237 mLs into feeding tube 4 (four) times daily. 237 mL 0  . ondansetron (ZOFRAN) 8 MG tablet Take 1 tablet (8 mg total) by mouth 2 (two) times daily as needed. Start on the third day after chemotherapy. 30 tablet 1  . scopolamine (TRANSDERM-SCOP) 1 MG/3DAYS Place 1 patch (1.5 mg total) onto the skin every 3 (three) days. 10 patch 12  . sucralfate (CARAFATE) 1 g tablet Dissolve 1 tablet in 10 mL H20 and swallow 30 min prior to meals and bedtime. 30 tablet 5  . vancomycin (VANCOCIN) 1 GM/200ML SOLN Inject 200 mLs (1,000 mg total) into the vein every 12 (twelve) hours. 4000 mL 0  . lidocaine-prilocaine (EMLA) cream Apply to affected area once (Patient not taking: Reported on 12/03/2015) 30 g 3  . oxyCODONE-acetaminophen (PERCOCET) 10-325 MG tablet Reported on 12/03/2015  0   No current facility-administered medications for this visit.    PHYSICAL EXAMINATION: ECOG PERFORMANCE STATUS: 1 - Symptomatic but completely ambulatory  Filed Vitals:   12/03/15 1403  BP: 127/62  Pulse: 90  Temp: 98.3 F (36.8 C)  Resp: 17   Filed Weights   12/03/15 1403  Weight: 149 lb 9.6 oz (67.858 kg)    GENERAL:alert, no distress and comfortable. She looks thin and cachectic SKIN: skin color, texture, turgor are normal, no rashes or significant lesions EYES: normal, Conjunctiva are pink and non-injected, sclera clear OROPHARYNX:no exudate, no erythema and lips, buccal mucosa, and tongue normal  NECK: supple, thyroid normal size, non-tender, without nodularity LYMPH:  She has mild palpable lymphadenopathy in the right of the neck, unchanged compared to prior exam LUNGS: clear to auscultation and percussion with normal breathing effort HEART: regular rate & rhythm and no murmurs and no lower extremity  edema ABDOMEN:abdomen soft, non-tender and normal bowel sounds. She has persistent drainage tube in the midline and mild drainage around the feeding tube Musculoskeletal:no cyanosis of digits and no clubbing  NEURO: alert & oriented x 3 with fluent speech, no focal motor/sensory deficits  LABORATORY DATA:  I have reviewed the data as listed    Component Value Date/Time   NA 134* 12/03/2015 1352   NA 130* 11/30/2015 0706   K 4.1 12/03/2015 1352   K 3.9 11/30/2015 0706   CL 98* 11/30/2015 0706   CO2 30* 12/03/2015 1352   CO2 27 11/30/2015 0706   GLUCOSE 159* 12/03/2015 1352   GLUCOSE 110* 11/30/2015 0706   BUN 21.3 12/03/2015 1352   BUN 19 11/30/2015 0706   CREATININE 0.9 12/03/2015 1352   CREATININE 0.64 11/30/2015 0706   CREATININE 1.04 04/07/2015 1536   CALCIUM 9.0 12/03/2015 1352   CALCIUM 8.3* 11/30/2015 0706   PROT 6.8 12/03/2015 1352   PROT 6.0* 11/30/2015 0706   ALBUMIN 2.7* 12/03/2015 1352   ALBUMIN 2.3* 11/30/2015 0706   AST 17 12/03/2015 1352   AST 25 11/30/2015 0706   ALT 18 12/03/2015 1352   ALT 27 11/30/2015 0706   ALKPHOS 108 12/03/2015 1352   ALKPHOS 87 11/30/2015 0706   BILITOT 0.46 12/03/2015 1352   BILITOT 0.6 11/30/2015 0706   GFRNONAA >60 11/30/2015 0706   GFRAA >60 11/30/2015 0706    No results found for: SPEP, UPEP  Lab Results  Component Value Date  WBC 5.8 12/03/2015   NEUTROABS 4.3 12/03/2015   HGB 9.6* 12/03/2015   HCT 30.5* 12/03/2015   MCV 66.7* 12/03/2015   PLT 447* 12/03/2015      Chemistry      Component Value Date/Time   NA 134* 12/03/2015 1352   NA 130* 11/30/2015 0706   K 4.1 12/03/2015 1352   K 3.9 11/30/2015 0706   CL 98* 11/30/2015 0706   CO2 30* 12/03/2015 1352   CO2 27 11/30/2015 0706   BUN 21.3 12/03/2015 1352   BUN 19 11/30/2015 0706   CREATININE 0.9 12/03/2015 1352   CREATININE 0.64 11/30/2015 0706   CREATININE 1.04 04/07/2015 1536      Component Value Date/Time   CALCIUM 9.0 12/03/2015 1352   CALCIUM  8.3* 11/30/2015 0706   ALKPHOS 108 12/03/2015 1352   ALKPHOS 87 11/30/2015 0706   AST 17 12/03/2015 1352   AST 25 11/30/2015 0706   ALT 18 12/03/2015 1352   ALT 27 11/30/2015 0706   BILITOT 0.46 12/03/2015 1352   BILITOT 0.6 11/30/2015 0706       ASSESSMENT & PLAN:  Cancer of base of tongue ([place]) She could not tolerate further chemotherapy due to recent side effects and infection. She will continue radiation therapy only and I will continue to provide supportive care.  Infection with methicillin-resistant Staphylococcus aureus (MRSA) I was surprised to learn that the dose of vancomycin was changed to 1750 mg daily. I thought she was supposed to go home on 1000 mg twice a day I will order a vancomycin trough immediately today for fear that she may have toxic level. I will call the patient with test results and any changes to the dose is needed, we will call [facility]  Severe protein-calorie malnutrition ([place]) She has lost a lot of weight. She have a lack of taste sensation. I reinforced the importance of oral intake as tolerated and increased nutritional supplement through the feeding tube as tolerated. She will follow with dietitian tomorrow   Anemia due to other cause This is likely anemia of chronic disease. The patient denies recent history of bleeding such as epistaxis, hematuria or hematochezia. She is asymptomatic from the anemia. We will observe for now.  She does not require transfusion now.    Drainage from gastrostomy tube site [facility]) She has minor drainage around the gastrostomy site. She has appointment to follow-up with interventional radiology next week for further assessment  Abdominal abscess ([place]) Abdominal examination is benign. She has appointment next week with follow-up CT to evaluate.   Orders Placed This Encounter  Procedures  . Vancomycin, trough    Standing Status: Future     Number of Occurrences: 1     Standing Expiration Date:  12/02/2016   All questions were answered. The patient knows to call the clinic with any problems, questions or concerns. No barriers to learning was detected. I spent 25 minutes counseling the patient face to face. The total time spent in the appointment was 30 minutes and more than 50% was on counseling and review of test results     [facility], [place], MD 12/03/2015 3:23 PM

## 2015-12-03 NOTE — Assessment & Plan Note (Signed)
She has minor drainage around the gastrostomy site. She has appointment to follow-up with interventional radiology next week for further assessment

## 2015-12-03 NOTE — Assessment & Plan Note (Signed)
This is likely anemia of chronic disease. The patient denies recent history of bleeding such as epistaxis, hematuria or hematochezia. She is asymptomatic from the anemia. We will observe for now.  She does not require transfusion now.   

## 2015-12-03 NOTE — Assessment & Plan Note (Signed)
I was surprised to learn that the dose of vancomycin was changed to 1750 mg daily. I thought she was supposed to go home on 1000 mg twice a day I will order a vancomycin trough immediately today for fear that she may have toxic level. I will call the patient with test results and any changes to the dose is needed, we will call [facility]

## 2015-12-03 NOTE — Telephone Encounter (Signed)
Gave and printed appt sched and avs for pt for March adn April

## 2015-12-03 NOTE — Assessment & Plan Note (Signed)
Abdominal examination is benign. She has appointment next week with follow-up CT to evaluate.

## 2015-12-04 ENCOUNTER — Encounter: Payer: Self-pay | Admitting: Nutrition

## 2015-12-04 ENCOUNTER — Telehealth: Payer: Self-pay | Admitting: *Deleted

## 2015-12-04 ENCOUNTER — Ambulatory Visit: Payer: 59 | Admitting: Nutrition

## 2015-12-04 ENCOUNTER — Ambulatory Visit: Payer: 59

## 2015-12-04 ENCOUNTER — Ambulatory Visit
Admission: RE | Admit: 2015-12-04 | Discharge: 2015-12-04 | Disposition: A | Discharge status: Home / Self Care | Payer: 59 | Source: Ambulatory Visit | Attending: Radiation Oncology | Admitting: Radiation Oncology

## 2015-12-04 DIAGNOSIS — E43 Unspecified severe protein-calorie malnutrition: Secondary | ICD-10-CM

## 2015-12-04 DIAGNOSIS — C01 Malignant neoplasm of base of tongue: Secondary | ICD-10-CM

## 2015-12-04 DIAGNOSIS — Z510 Encounter for antineoplastic radiation therapy: Secondary | ICD-10-CM | POA: Diagnosis not present

## 2015-12-04 DIAGNOSIS — Z809 Family history of malignant neoplasm, unspecified: Secondary | ICD-10-CM | POA: Diagnosis not present

## 2015-12-04 DIAGNOSIS — E785 Hyperlipidemia, unspecified: Secondary | ICD-10-CM | POA: Diagnosis not present

## 2015-12-04 DIAGNOSIS — L309 Dermatitis, unspecified: Secondary | ICD-10-CM | POA: Diagnosis not present

## 2015-12-04 DIAGNOSIS — I10 Essential (primary) hypertension: Secondary | ICD-10-CM | POA: Diagnosis not present

## 2015-12-04 DIAGNOSIS — D563 Thalassemia minor: Secondary | ICD-10-CM | POA: Diagnosis not present

## 2015-12-04 LAB — VANCOMYCIN, RANDOM: VANCOMYCIN RANDOM, SERUM: 15.7 ug/mL (ref 5.0–40.0)

## 2015-12-04 MED ORDER — OSMOLITE 1.5 CAL PO LIQD: ORAL | Status: DC

## 2015-12-04 NOTE — Telephone Encounter (Signed)
Spoke w/ Pharmacist, Jeani Hawking, at [facility].  Informed him of Verbal Order from Dr. Alvy Bimler for Pharmacy to Manage and Monitor IV Vancomycin being administered at home.  Jeani Hawking states they will be happy to manage.  They will continue twice weekly labs to monitor and adjust dose as needed.  No need for Dr. Alvy Bimler to order labs for Vancomycin. They will contact us if any problems or concerns.

## 2015-12-04 NOTE — Progress Notes (Signed)
Nutrition follow-up completed with patient who is being treated for tongue cancer. Patient did not tolerate chemotherapy but will continue with radiation treatments. Weight decreased significantly and documented as 149.6 pounds March 9, down from 161.7 pounds February 14. Patient is eating very little by mouth and has been using her feeding tube to meet greater than 90% estimated needs. Patient is only tolerating Osmolite 1.2, one can 4 times a day with 60 cc free water before and after bolus feedings. Denies difficulty using feeding tube. Reports never receiving Protostat protein supplement.  Estimated nutrition needs: 2040-2300 calories, 95-108 grams protein, 2.3 L fluid.  Nutrition diagnosis: Inadequate oral intake continues.  Inadequate enteral nutrition infusion related to infusion volume not reached as evidenced by inadequate enteral nutrition volume compared to estimated requirements.  Intervention:  Educated patient to continue to try to increase oral intake as tolerated. Change Osmolite 1.2  To Osmolite 1.5, 1-1/2 cans 4 times a day with 60 cc free water before and after bolus feedings. Continue to flush feeding tube with 60 cc free water before and after bolus feeding. Give 30 mL Protostat daily through feeding tube.  Ensure to flush adequately after bolus. Educated patient to consume an additional 40 ounces of water daily or flush 240 cc in feeding tube 5 times a day. Patient verbalizes understanding and agreement to plan.  5 cans Osmolite 1.5+30 mL Protostat plus free water flushes will provide 2231 Calories, 104 g protein, 2286 mL free water.  Monitoring, evaluation, goals: Patient will tolerate increase in tube feeding to meet minimal estimated nutrition needs to minimize weight loss.  Next visit: Friday, March 17, after radiation therapy.  **Disclaimer: This note was dictated with voice recognition software. Similar sounding words can inadvertently be transcribed and this  note may contain transcription errors which may not have been corrected upon publication of note.**

## 2015-12-04 NOTE — Telephone Encounter (Signed)
-----   Message from [name], MD sent at 12/04/2015  3:40 PM EST ----- Regarding: vanc level PLs let her know vanc level is good Continue monitoring with [facility] ----- Message -----    From: [facility] Interface    Sent: 12/04/2015   3:38 PM      To: [name], MD

## 2015-12-04 NOTE — Telephone Encounter (Signed)
Informed pt of Dr. Gorsuch's message below. She verbalized understanding.  

## 2015-12-06 DIAGNOSIS — R5081 Fever presenting with conditions classified elsewhere: Secondary | ICD-10-CM | POA: Diagnosis not present

## 2015-12-06 DIAGNOSIS — C01 Malignant neoplasm of base of tongue: Secondary | ICD-10-CM | POA: Diagnosis not present

## 2015-12-06 DIAGNOSIS — E43 Unspecified severe protein-calorie malnutrition: Secondary | ICD-10-CM | POA: Diagnosis not present

## 2015-12-06 DIAGNOSIS — L0291 Cutaneous abscess, unspecified: Secondary | ICD-10-CM | POA: Diagnosis not present

## 2015-12-07 ENCOUNTER — Ambulatory Visit
Admission: RE | Admit: 2015-12-07 | Discharge: 2015-12-07 | Disposition: A | Discharge status: Home / Self Care | Payer: 59 | Source: Ambulatory Visit | Attending: Radiation Oncology | Admitting: Radiation Oncology

## 2015-12-07 ENCOUNTER — Ambulatory Visit: Payer: 59

## 2015-12-07 ENCOUNTER — Encounter: Payer: Self-pay | Admitting: Nutrition

## 2015-12-07 ENCOUNTER — Encounter: Payer: Self-pay | Admitting: Radiation Oncology

## 2015-12-07 VITALS — BP 135/60 | HR 89 | Temp 98.5°F | Resp 20 | Wt 148.5 lb

## 2015-12-07 DIAGNOSIS — C01 Malignant neoplasm of base of tongue: Secondary | ICD-10-CM | POA: Diagnosis not present

## 2015-12-07 DIAGNOSIS — E785 Hyperlipidemia, unspecified: Secondary | ICD-10-CM | POA: Diagnosis not present

## 2015-12-07 DIAGNOSIS — Z809 Family history of malignant neoplasm, unspecified: Secondary | ICD-10-CM | POA: Diagnosis not present

## 2015-12-07 DIAGNOSIS — I10 Essential (primary) hypertension: Secondary | ICD-10-CM | POA: Diagnosis not present

## 2015-12-07 DIAGNOSIS — L309 Dermatitis, unspecified: Secondary | ICD-10-CM | POA: Diagnosis not present

## 2015-12-07 DIAGNOSIS — Z510 Encounter for antineoplastic radiation therapy: Secondary | ICD-10-CM | POA: Diagnosis not present

## 2015-12-07 DIAGNOSIS — D563 Thalassemia minor: Secondary | ICD-10-CM | POA: Diagnosis not present

## 2015-12-07 MED ORDER — OSMOLITE 1.5 CAL PO LIQD: ORAL | Status: DC

## 2015-12-07 NOTE — Progress Notes (Signed)
   Weekly Management Note:  Outpatient    ICD-9-CM ICD-10-CM   1. 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  CBCT/MVCT images/Port film x-rays were reviewed.  The chart was checked. No pain today.  Skin intact. Some nausea and fatigue. Will take Zofran. Physical Findings:  Wt Readings from Last 3 Encounters:  12/07/15 148 lb 8 oz (67.359 kg)  12/03/15 149 lb 9.6 oz (67.858 kg)  12/01/15 153 lb (69.4 kg)    weight is 148 lb 8 oz (67.359 kg). Her oral temperature is 98.5 F (36.9 C). Her blood pressure is 135/60 and her pulse is 89. Her respiration is 20 and oxygen saturation is 100%.  No  skin irritation thus far. + right neck mass;  whitish tongue, no obvious thrush  CBC    Component Value Date/Time   WBC 5.8 12/03/2015 1352   WBC 9.6 11/27/2015 0600   RBC 4.58 12/03/2015 1352   RBC 4.27 11/27/2015 0600   RBC 3.97 11/15/2015 0700   HGB 9.6* 12/03/2015 1352   HGB 9.5* 11/27/2015 0600   HCT 30.5* 12/03/2015 1352   HCT 28.8* 11/27/2015 0600   PLT 447* 12/03/2015 1352   PLT 314 11/27/2015 0600   MCV 66.7* 12/03/2015 1352   MCV 67.4* 11/27/2015 0600   MCH 21.0* 12/03/2015 1352   MCH 22.2* 11/27/2015 0600   MCHC 31.5 12/03/2015 1352   MCHC 33.0 11/27/2015 0600   RDW 25.3* 12/03/2015 1352   RDW 22.4* 11/27/2015 0600   LYMPHSABS 0.7* 12/03/2015 1352   LYMPHSABS 1.1 11/27/2015 0600   MONOABS 0.7 12/03/2015 1352   MONOABS 0.6 11/27/2015 0600   EOSABS 0.0 12/03/2015 1352   EOSABS 0.0 11/27/2015 0600   BASOSABS 0.0 12/03/2015 1352   BASOSABS 0.0 11/27/2015 0600     CMP     Component Value Date/Time   NA 134* 12/03/2015 1352   NA 130* 11/30/2015 0706   K 4.1 12/03/2015 1352   K 3.9 11/30/2015 0706   CL 98* 11/30/2015 0706   CO2 30* 12/03/2015 1352   CO2 27 11/30/2015 0706   GLUCOSE 159* 12/03/2015 1352   GLUCOSE 110* 11/30/2015 0706   BUN 21.3  12/03/2015 1352   BUN 19 11/30/2015 0706   CREATININE 0.9 12/03/2015 1352   CREATININE 0.64 11/30/2015 0706   CREATININE 1.04 04/07/2015 1536   CALCIUM 9.0 12/03/2015 1352   CALCIUM 8.3* 11/30/2015 0706   PROT 6.8 12/03/2015 1352   PROT 6.0* 11/30/2015 0706   ALBUMIN 2.7* 12/03/2015 1352   ALBUMIN 2.3* 11/30/2015 0706   AST 17 12/03/2015 1352   AST 25 11/30/2015 0706   ALT 18 12/03/2015 1352   ALT 27 11/30/2015 0706   ALKPHOS 108 12/03/2015 1352   ALKPHOS 87 11/30/2015 0706   BILITOT 0.46 12/03/2015 1352   BILITOT 0.6 11/30/2015 0706   GFRNONAA >60 11/30/2015 0706   GFRAA >60 11/30/2015 0706     Impression:  The patient is tolerating radiotherapy.   Plan:  Continue radiotherapy as planned.       -----------------------------------  [name], MD

## 2015-12-07 NOTE — Progress Notes (Signed)
I was notified by [facility] that they do not carry Protostat protein supplements. They use  a protein supplement called ProMod. They are requesting new orders be written. Patient will continue Osmolite 1.5 1-1/2 cans 4 times a day with 60 cc free water before and after bolus feedings. Patient will use 45 mL Protostat daily through feeding tube with 240 cc free water flush. In addition patient will flush feeding tube with 240 cc free water 4 times a day or consume by mouth as tolerated. Orders were written and [facility] was notified.

## 2015-12-07 NOTE — Progress Notes (Addendum)
Weekly rd txs  Head neck 16/35 completed, has IV Vancomycin  infusiong via right port a cath,  Swallows water without difficulty, ha no taste, taking jevity 1.5 cal can 4xday via peg tube, will get osmolite case tomorrow delivered to her home, tongue white coated, dry, not using scopoline patch, not dizzy, slight nausea, will start taking zofran, flushes peg with free water also prostat 45 ml via peg daily with 240cc free water, no c/o pain, slight erythema on neck, wills tart using her cream today,  Skin intact, fatigued, saw B. Neff, dietician last Friday   Wt Readings from Last 3 Encounters:  12/03/15 149 lb 9.6 oz (67.858 kg)  12/01/15 153 lb (69.4 kg)  11/30/15 153 lb 11.2 oz (69.718 kg)  BP 135/60 mmHg  Pulse 89  Temp(Src) 98.5 F (36.9 C) (Oral)  Resp 20  Wt 148 lb 8 oz (67.359 kg)  SpO2 100%

## 2015-12-08 ENCOUNTER — Ambulatory Visit
Admission: RE | Admit: 2015-12-08 | Discharge: 2015-12-08 | Disposition: A | Discharge status: Home / Self Care | Payer: 59 | Source: Ambulatory Visit | Attending: Hematology and Oncology | Admitting: Hematology and Oncology

## 2015-12-08 ENCOUNTER — Other Ambulatory Visit: Payer: Self-pay

## 2015-12-08 ENCOUNTER — Ambulatory Visit
Admission: RE | Admit: 2015-12-08 | Discharge: 2015-12-08 | Disposition: A | Discharge status: Home / Self Care | Payer: 59 | Source: Ambulatory Visit | Attending: Radiation Oncology | Admitting: Radiation Oncology

## 2015-12-08 ENCOUNTER — Ambulatory Visit: Payer: 59

## 2015-12-08 ENCOUNTER — Ambulatory Visit
Admit: 2015-12-08 | Discharge: 2015-12-08 | Disposition: A | Discharge status: Home / Self Care | Payer: 59 | Attending: General Surgery | Admitting: General Surgery

## 2015-12-08 DIAGNOSIS — Z809 Family history of malignant neoplasm, unspecified: Secondary | ICD-10-CM | POA: Diagnosis not present

## 2015-12-08 DIAGNOSIS — D563 Thalassemia minor: Secondary | ICD-10-CM | POA: Diagnosis not present

## 2015-12-08 DIAGNOSIS — L0291 Cutaneous abscess, unspecified: Secondary | ICD-10-CM

## 2015-12-08 DIAGNOSIS — Z5181 Encounter for therapeutic drug level monitoring: Secondary | ICD-10-CM | POA: Diagnosis not present

## 2015-12-08 DIAGNOSIS — K651 Peritoneal abscess: Secondary | ICD-10-CM | POA: Diagnosis not present

## 2015-12-08 DIAGNOSIS — E43 Unspecified severe protein-calorie malnutrition: Secondary | ICD-10-CM | POA: Diagnosis not present

## 2015-12-08 DIAGNOSIS — Z510 Encounter for antineoplastic radiation therapy: Secondary | ICD-10-CM | POA: Diagnosis not present

## 2015-12-08 DIAGNOSIS — L309 Dermatitis, unspecified: Secondary | ICD-10-CM | POA: Diagnosis not present

## 2015-12-08 DIAGNOSIS — I10 Essential (primary) hypertension: Secondary | ICD-10-CM | POA: Diagnosis not present

## 2015-12-08 DIAGNOSIS — C01 Malignant neoplasm of base of tongue: Secondary | ICD-10-CM | POA: Diagnosis not present

## 2015-12-08 DIAGNOSIS — R5081 Fever presenting with conditions classified elsewhere: Secondary | ICD-10-CM | POA: Diagnosis not present

## 2015-12-08 DIAGNOSIS — E785 Hyperlipidemia, unspecified: Secondary | ICD-10-CM | POA: Diagnosis not present

## 2015-12-08 DIAGNOSIS — Z4682 Encounter for fitting and adjustment of non-vascular catheter: Secondary | ICD-10-CM | POA: Diagnosis not present

## 2015-12-08 MED ORDER — IOPAMIDOL (ISOVUE-300) INJECTION 61%
100.0000 mL | Freq: Once | INTRAVENOUS | Status: AC | PRN
  Administered 2015-12-08: 100 mL via INTRAVENOUS

## 2015-12-08 NOTE — Progress Notes (Signed)
Referring Physician(s): [name] MD  Chief Complaint: The patient is seen in follow up today s/p peritoneal abscess drain placed 11/25/2015  History of present illness:  Tongue cancer Dysphagia Percutaneous gastric tube placed 11/05/2015 Developed fever and leukocytosis: peritoneal abscess Drain placed 11/25/2015  Scheduled today for recheck CT and possible drain injection   Pt doing well Output minimal and serous in color Denies pain; fever   Past Medical History  Diagnosis Date  . Hypertension   . Hyperlipidemia   . Eczema   . Thalassemia minor   . 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 Laceration 04/2014    around R ear, for falling out of bed & hitting nightstand  . Intractable nausea and vomiting 11/13/2015  . Abdominal abscess ([place]) 11/30/2015  . Infection with methicillin-resistant Staphylococcus aureus (MRSA) 11/30/2015    Past Surgical History  Procedure Laterality Date  . Abdominal hysterectomy  1990    partial  . Incontinence surgery  1990, [id number]  . Rectocele repair    . Urocele      correction surgery  . Tonsillectomy      as a child  . Direct laryngoscopy N/A 10/28/2015    Procedure: DIRECT LARYNGOSCOPY WITH BIOPSY;  Surgeon: [name], MD;  Location: [facility];  Service: ENT;  Laterality: N/A;  . Esophagoscopy N/A 10/28/2015    Procedure: ESOPHAGOSCOPY;  Surgeon: [name], MD;  Location: [facility];  Service: ENT;  Laterality: N/A;  . Laryngoscopy and bronchoscopy N/A 10/28/2015    Procedure: BRONCHOSCOPY;  Surgeon: [name], MD;  Location: [facility];  Service: ENT;  Laterality: N/A;  . Multiple extractions with alveoloplasty N/A 10/28/2015    Procedure: Extraction of tooth #'s 2-12, 14,15,17,18,20-29, 31 with alveoloplasy and mandibular left torus reduction;  Surgeon: Lenn Cal, DDS;  Location: [facility];  Service: Oral Surgery;  Laterality: N/A;    Allergies: Morphine and related  Medications: Prior to Admission medications   Medication Sig  Start Date End Date Taking? Authorizing Provider  amLODipine (NORVASC) 5 MG tablet Place 1 tablet (5 mg total) into feeding tube 2 (two) times daily. 11/30/15   [name], MD  lidocaine-prilocaine (EMLA) cream Apply to affected area once 11/10/15   [name], MD  Nutritional Supplements (FEEDING SUPPLEMENT, OSMOLITE 1.5 CAL,) LIQD Osmolite 1.5, 1.5 cans 4 times daily with 60 cc free water before and after bolus feeding. Give 45 ml Liquid Promod (or equivalent) once daily with 240 cc water flush after protein supplement. Give additional 240 cc free water flushes 4 times daily. 12/07/15   [name], MD  ondansetron (ZOFRAN) 8 MG tablet Take 1 tablet (8 mg total) by mouth 2 (two) times daily as needed. Start on the third day after chemotherapy. 11/10/15   [name], MD  oxyCODONE-acetaminophen (PERCOCET) 10-325 MG tablet Reported on 12/03/2015 11/06/15   Historical Provider, MD  scopolamine (TRANSDERM-SCOP) 1 MG/3DAYS Place 1 patch (1.5 mg total) onto the skin every 3 (three) days. Patient not taking: Reported on 12/07/2015 11/13/15   [name], MD  sucralfate (CARAFATE) 1 g tablet Dissolve 1 tablet in 10 mL H20 and swallow 30 min prior to meals and bedtime. 11/30/15   [name], MD  vancomycin (VANCOCIN) 1 GM/200ML SOLN Inject 200 mLs (1,000 mg total) into the vein every 12 (twelve) hours. 11/30/15   [name], MD     Family History  Problem Relation Age of Onset  . Cancer Mother     lung  .  Asthma Father     Social History   Social History  . Marital Status: Unknown    Spouse Name: N/A  . Number of Children: 3  . Years of Education: N/A   Occupational History  .  [employer]   Social History Main Topics  . Smoking status: Never Smoker   . Smokeless tobacco: Never Used  . Alcohol Use: No  . Drug Use: No  . Sexual Activity: No     Comment: widowed. 3 daughters. 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 Not on file   Social History Narrative   The patient is  widowed with 3 Daughters.    Moved to [facility] 2005.  RN at [employer].   Enjoys biking and walking.    Leeanne Mannan- Grandaughter- age 44- Has lived with Kevia since birth.            Vital Signs: BP 111/59 mmHg  Pulse 87  Temp(Src) 98.5 F (36.9 C) (Oral)  SpO2 98%  Physical Exam  Abdominal: Soft. Bowel sounds are normal.  Skin: Skin is warm and dry.  Site of drain is clean and dry NT no bleeding  no sign of infection  Output scant in bag--serous color Output less than 15 cc per day  afeb    Imaging: Ct Abdomen Pelvis W Contrast  12/08/2015  CLINICAL DATA:  71 year old female with a history of upper abdominal abscess. Drainage was performed 11/25/2015. The patient reports today scant fluid output. EXAM: CT ABDOMEN AND PELVIS WITH CONTRAST TECHNIQUE: Multidetector CT imaging of the abdomen and pelvis was performed using the standard protocol following bolus administration of intravenous contrast. CONTRAST:  131m ISOVUE-300 IOPAMIDOL (ISOVUE-300) INJECTION 61% COMPARISON:  11/25/2015 11/24/2015 FINDINGS: Lower chest: Unremarkable appearance of the superficial soft tissues of the chest. 8 mm nodule at the left costophrenic angle, which does appear on the sagittal reformatted images to have significant volumetric size likely 8 mm in cranial caudal dimension. This was in a region of prior inflammatory changes on the CT 11/24/2015. Heart size within normal limits.  No pericardial fluid/ thickening. Abdomen/ pelvis: Percutaneous surgical drain within the epigastric region overlying the anterior margin of the liver. There is no significant residual fluid at the prior abscess cavity. Unremarkable liver, unremarkable gallbladder. Unremarkable spleen. Unremarkable bilateral adrenal glands. Unremarkable pancreas. Unremarkable left kidney. Low-density lesion within the right kidney measures 11 mm, compatible with a benign cyst. This was present on the comparison CT of 11/24/2015.  Unremarkable appearance of small bowel and colon without evidence of obstruction or transition point. Percutaneous gastrostomy tube within the upper abdomen of well positioned. Surgical clips within the anatomic pelvis. Unremarkable appearance of the urinary bladder. Surgical changes of hysterectomy. Scattered vascular calcifications of the abdominal aorta. No aneurysm or dissection flap. Musculoskeletal: No displaced fracture. Multilevel degenerative disc disease with associated facet disease. No significant bony canal narrowing. Degenerative changes of the bilateral hips. IMPRESSION: Complete resolution of upper abdominal abscess with percutaneous drain in position. Percutaneous gastrostomy tube remains well positioned. There is a new 8 mm nodule at the left lung base. This has developed since the comparison PET-CT of 10/14/2015, and was in a region of inflammatory changes on the prior abdominal CT. While this is most likely postinflammatory, a short-term follow-up chest CT is recommended in 3 months given the patient's history of malignancy, to assure resolution and rule out metastatic disease. Signed, JDulcy Fanny WEarleen Newport DO [facility]  Radiology Electronically Signed   By: Corrie Mckusick D.O.   On: 12/08/2015 13:03    Labs:  CBC:  Recent Labs  11/25/15 0520 11/26/15 0530 11/27/15 0600 12/03/15 1352  WBC 1.9* 6.5 9.6 5.8  HGB 7.3* 9.2* 9.5* 9.6*  HCT 22.8* 27.4* 28.8* 30.5*  PLT 257 248 314 447*    COAGS:  Recent Labs  10/27/15 1000 11/05/15 1212  INR 1.08 1.18  APTT  --  35    BMP:  Recent Labs  11/27/15 0600 11/28/15 0513 11/29/15 0622 11/30/15 0706 12/03/15 1352  NA 135 132* 131* 130* 134*  K 3.3* 3.5 4.0 3.9 4.1  CL 101 97* 96* 98*  --   CO2 '25 27 28 27 '$ 30*  GLUCOSE 120* 114* 118* 110* 159*  BUN 21* 21* 21* 19 21.3  CALCIUM 8.4* 8.2* 8.5* 8.3* 9.0  CREATININE 0.70 0.65 0.71 0.64 0.9  GFRNONAA >60 >60 >60 >60  --     GFRAA >60 >60 >60 >60  --     LIVER FUNCTION TESTS:  Recent Labs  11/28/15 0513 11/29/15 0622 11/30/15 0706 12/03/15 1352  BILITOT 0.5 0.6 0.6 0.46  AST '31 29 25 17  '$ ALT '29 30 27 18  '$ ALKPHOS 96 91 87 108  PROT 6.2* 6.2* 6.0* 6.8  ALBUMIN 2.3* 2.4* 2.3* 2.7*    Assessment:  Peritoneal abscess drain intact CT was reviewed by Dr Earleen Newport With minimal output; afeb---decision to pull  Removed without issue Dressing placed  Signed: Taite Schoeppner A 12/08/2015, 3:14 PM   Please refer to Dr. Earleen Newport attestation of this note for management and plan.

## 2015-12-09 ENCOUNTER — Encounter: Payer: Self-pay | Admitting: *Deleted

## 2015-12-09 ENCOUNTER — Ambulatory Visit (HOSPITAL_BASED_OUTPATIENT_CLINIC_OR_DEPARTMENT_OTHER): Payer: 59 | Admitting: Hematology and Oncology

## 2015-12-09 ENCOUNTER — Encounter: Payer: Self-pay | Admitting: Hematology and Oncology

## 2015-12-09 ENCOUNTER — Ambulatory Visit
Admission: RE | Admit: 2015-12-09 | Discharge: 2015-12-09 | Disposition: A | Discharge status: Home / Self Care | Payer: 59 | Source: Ambulatory Visit | Attending: Radiation Oncology | Admitting: Radiation Oncology

## 2015-12-09 ENCOUNTER — Ambulatory Visit: Payer: 59

## 2015-12-09 ENCOUNTER — Telehealth: Payer: Self-pay | Admitting: Hematology and Oncology

## 2015-12-09 VITALS — BP 137/66 | HR 94 | Temp 98.1°F | Resp 19 | Wt 149.1 lb

## 2015-12-09 DIAGNOSIS — Z809 Family history of malignant neoplasm, unspecified: Secondary | ICD-10-CM | POA: Diagnosis not present

## 2015-12-09 DIAGNOSIS — C01 Malignant neoplasm of base of tongue: Secondary | ICD-10-CM

## 2015-12-09 DIAGNOSIS — A4902 Methicillin resistant Staphylococcus aureus infection, unspecified site: Secondary | ICD-10-CM

## 2015-12-09 DIAGNOSIS — I10 Essential (primary) hypertension: Secondary | ICD-10-CM | POA: Diagnosis not present

## 2015-12-09 DIAGNOSIS — Z510 Encounter for antineoplastic radiation therapy: Secondary | ICD-10-CM | POA: Diagnosis not present

## 2015-12-09 DIAGNOSIS — E785 Hyperlipidemia, unspecified: Secondary | ICD-10-CM | POA: Diagnosis not present

## 2015-12-09 DIAGNOSIS — D563 Thalassemia minor: Secondary | ICD-10-CM | POA: Diagnosis not present

## 2015-12-09 DIAGNOSIS — L309 Dermatitis, unspecified: Secondary | ICD-10-CM | POA: Diagnosis not present

## 2015-12-09 DIAGNOSIS — E43 Unspecified severe protein-calorie malnutrition: Secondary | ICD-10-CM | POA: Diagnosis not present

## 2015-12-09 NOTE — Progress Notes (Signed)
  Oncology Nurse Navigator Documentation  Navigator Location: [facility]Med Onc (12/09/15 1035) Navigator Encounter Type: Clinic/MDC (12/09/15 1035)   Abnormal Finding Date: 09/29/15 (12/09/15 1035) Confirmed Diagnosis Date: 10/08/15 (12/09/15 1035)   Treatment Initiated Date: 11/11/15 (12/09/15 1035) Patient Visit Type: MedOnc (12/09/15 1035) Treatment Phase: Treatment (12/09/15 1035) Barriers/Navigation Needs: No barriers at this time (12/09/15 1035)   Interventions: None required (12/09/15 1035)       To provide support and encouragement, care continuity and to assess for needs, met with Ms Trecia Rogers during est pt appt with Dr. Alvy Bimler.  She reported:  Improved swallowing.  Drinking water.  Not conducting swallowing exercises.  I encouraged her to conduct these exercises twice daily in order to maintain swallowing function and to minimize post-XRT fibrosus of neck muscles.  Using PEG for nutritional supplement and water. She voiced understanding of Dr. Calton Dach guidance:  To drink milkshakes for enhance calorie intake, eat eggs for improved protein.  To request copies of lab work conducted by [facility]. She voiced understanding of guidance provided. She denied any needs or concerns; I encouraged her to contact me if that changes before I see her next, she verbalized understanding.  Gayleen Orem, RN, BSN, [place] [facility] [place] [phone]                   Time Spent with Patient: 30 (12/09/15 1035)

## 2015-12-09 NOTE — Assessment & Plan Note (Signed)
She is doing well with radiation only. The intra-abdominal drainage tube has been removed. We review her imaging study at tumor board today which showed nonspecific lung nodule at the left lung base, cause unknown. We will repeat imaging study in a few months to follow I will see her back next week for further supportive care

## 2015-12-09 NOTE — Assessment & Plan Note (Signed)
This is slowly improving. I recommend oral intake as tolerated. She will visit with dietitian today for further management of tube feeds

## 2015-12-09 NOTE — Progress Notes (Signed)
[place] OFFICE PROGRESS NOTE  Patient Care Team: [name], MD as PCP - General [name], RN as Oncology Nurse Navigator [name], MD as Consulting Physician (Hematology and Oncology) [name], MD as Attending Physician (Radiation Oncology) [name], RD as Dietitian (Nutrition)  SUMMARY OF ONCOLOGIC HISTORY: Oncology History   Cancer of base of tongue [facility])   Staging form: Lip and Oral Cavity, AJCC 7th Edition     Clinical stage from 10/14/2015: Stage IVA (T2, N2b, M0) - Signed by [name], MD on 10/19/2015       Cancer of base of tongue ([place])   09/29/2015 Pathology Results Accession: [id number] FNA right neck showed necrotic cells suspicious for squamous cell cancer   09/29/2015 Pathology Results Accession: [id number] Mouth biopsy showed pyogenic granuloma   10/08/2015 Imaging CT neck showed 1. 3 cm right tongue base mass consistent with malignancy.2. Necrotic right level II and III nodal metastases.3. Enlarged right thyroid lobe containing multiple nodules.   10/14/2015 Imaging PET scan showed 1. Hypermetabolic right tongue base mass with hypermetabolic right level II and III adenopathy. Small left level III lymph node appears mildly hypermetabolic as well.2. A few scattered tiny pulmonary nodules are too small for PETresolution   10/16/2015 Imaging [place] Head/Neck:  1) Multiple thyroid lesions bilaterally; dominant nodule in right lower pole measures 22 mm and meets fine-needle aspiration criteria. 2) Complex mass within the soft tissues of the right side of neck worrisome for metastatic malignancy.   10/26/2015 Pathology Results Accession [id number]:  Thyroid FNA - Consistent with benign follicular nodule.   10/28/2015 Surgery Multiple tooth extractions (26), right tongue base biopsy.   10/28/2015 Pathology Results Accession [id number]:  Tongue, right base -  Infiltrative SCC, p16 positive.  Actinomyces infection.   11/05/2015 Procedure Port-a-cath and PEG placed.    11/11/2015 -  Radiation Therapy She received radiation   11/11/2015 - 11/12/2015 Chemotherapy She received high dose cisplatin. Treatment was discontinued permanently due to severe side-effects   11/13/2015 Adverse Reaction She presented with intractable nausea and vomiting   11/13/2015 - 11/30/2015 Hospital Admission She was hospitalized for intractable nausea, vomiting and developed acute renal failure, reversed with aggressive IV fluid hydration. Subsequently, she was found to have intra-abdominal infection with MRSA requiring placement of drainage tube and IV ABx   11/24/2015 Imaging CT scan showed large abdominal abscess   11/25/2015 Procedure She had placement of drain   12/08/2015 Procedure Intra-abdominal drain was removed   12/08/2015 Imaging CT abdomen showed resolution of abscess and a new left lower base lung nodules    INTERVAL HISTORY: Please see below for problem oriented charting. She returns for further follow-up. Her drain has been removed yesterday. CT scan showed resolution of abscess but showed a new nonspecific nodule in the left lower base She denies recent cough, fevers or chills She is gaining weight. She has problems getting nutritional supplement through home care  REVIEW OF SYSTEMS:   Constitutional: Denies fevers, chills or abnormal weight loss Eyes: Denies blurriness of vision Ears, nose, mouth, throat, and face: Denies mucositis or sore throat Respiratory: Denies cough, dyspnea or wheezes Cardiovascular: Denies palpitation, chest discomfort or lower extremity swelling Gastrointestinal:  Denies nausea, heartburn or change in bowel habits Skin: Denies abnormal skin rashes Lymphatics: Denies new lymphadenopathy or easy bruising Neurological:Denies numbness, tingling or new weaknesses Behavioral/Psych: Mood is stable, no new changes  All other systems were reviewed with the patient and are negative.  I have reviewed  the past medical history, past surgical history,  social history and family history with the patient and they are unchanged from previous note.  ALLERGIES:  is allergic to morphine and related.  MEDICATIONS:  Current Outpatient Prescriptions  Medication Sig Dispense Refill  . amLODipine (NORVASC) 5 MG tablet Place 1 tablet (5 mg total) into feeding tube 2 (two) times daily. 60 tablet 6  . Nutritional Supplements (FEEDING SUPPLEMENT, OSMOLITE 1.5 CAL,) LIQD Osmolite 1.5, 1.5 cans 4 times daily with 60 cc free water before and after bolus feeding. Give 45 ml Liquid Promod (or equivalent) once daily with 240 cc water flush after protein supplement. Give additional 240 cc free water flushes 4 times daily. 1422 mL 0  . vancomycin (VANCOCIN) 1 GM/200ML SOLN Inject 200 mLs (1,000 mg total) into the vein every 12 (twelve) hours. 4000 mL 0  . lidocaine-prilocaine (EMLA) cream Apply to affected area once (Patient not taking: Reported on 12/09/2015) 30 g 3  . ondansetron (ZOFRAN) 8 MG tablet Take 1 tablet (8 mg total) by mouth 2 (two) times daily as needed. Start on the third day after chemotherapy. (Patient not taking: Reported on 12/09/2015) 30 tablet 1  . sucralfate (CARAFATE) 1 g tablet Dissolve 1 tablet in 10 mL H20 and swallow 30 min prior to meals and bedtime. (Patient not taking: Reported on 12/09/2015) 30 tablet 5   No current facility-administered medications for this visit.    PHYSICAL EXAMINATION: ECOG PERFORMANCE STATUS: 1 - Symptomatic but completely ambulatory  Filed Vitals:   12/09/15 1030  BP: 137/66  Pulse: 94  Temp: 98.1 F (36.7 C)  Resp: 19   Filed Weights   12/09/15 1030  Weight: 149 lb 1.6 oz (67.631 kg)    GENERAL:alert, no distress and comfortable SKIN: skin color, texture, turgor are normal, no rashes or significant lesions EYES: normal, Conjunctiva are pink and non-injected, sclera clear OROPHARYNX:no exudate, no erythema and lips, buccal mucosa, and tongue normal  NECK: supple, thyroid normal size, non-tender,  without nodularity LYMPH:  She has lymphadenopathy on the right side of the neck, unchanged  LUNGS: clear to auscultation and percussion with normal breathing effort HEART: regular rate & rhythm and no murmurs and no lower extremity edema ABDOMEN:abdomen soft, non-tender and normal bowel sounds. Feeding tube site looks okay Musculoskeletal:no cyanosis of digits and no clubbing  NEURO: alert & oriented x 3 with fluent speech, no focal motor/sensory deficits  LABORATORY DATA:  I have reviewed the data as listed    Component Value Date/Time   NA 134* 12/03/2015 1352   NA 130* 11/30/2015 0706   K 4.1 12/03/2015 1352   K 3.9 11/30/2015 0706   CL 98* 11/30/2015 0706   CO2 30* 12/03/2015 1352   CO2 27 11/30/2015 0706   GLUCOSE 159* 12/03/2015 1352   GLUCOSE 110* 11/30/2015 0706   BUN 21.3 12/03/2015 1352   BUN 19 11/30/2015 0706   CREATININE 0.9 12/03/2015 1352   CREATININE 0.64 11/30/2015 0706   CREATININE 1.04 04/07/2015 1536   CALCIUM 9.0 12/03/2015 1352   CALCIUM 8.3* 11/30/2015 0706   PROT 6.8 12/03/2015 1352   PROT 6.0* 11/30/2015 0706   ALBUMIN 2.7* 12/03/2015 1352   ALBUMIN 2.3* 11/30/2015 0706   AST 17 12/03/2015 1352   AST 25 11/30/2015 0706   ALT 18 12/03/2015 1352   ALT 27 11/30/2015 0706   ALKPHOS 108 12/03/2015 1352   ALKPHOS 87 11/30/2015 0706   BILITOT 0.46 12/03/2015 1352   BILITOT 0.6 11/30/2015 0706  GFRNONAA >60 11/30/2015 0706   GFRAA >60 11/30/2015 0706    No results found for: SPEP, UPEP  Lab Results  Component Value Date   WBC 5.8 12/03/2015   NEUTROABS 4.3 12/03/2015   HGB 9.6* 12/03/2015   HCT 30.5* 12/03/2015   MCV 66.7* 12/03/2015   PLT 447* 12/03/2015      Chemistry      Component Value Date/Time   NA 134* 12/03/2015 1352   NA 130* 11/30/2015 0706   K 4.1 12/03/2015 1352   K 3.9 11/30/2015 0706   CL 98* 11/30/2015 0706   CO2 30* 12/03/2015 1352   CO2 27 11/30/2015 0706   BUN 21.3 12/03/2015 1352   BUN 19 11/30/2015 0706    CREATININE 0.9 12/03/2015 1352   CREATININE 0.64 11/30/2015 0706   CREATININE 1.04 04/07/2015 1536      Component Value Date/Time   CALCIUM 9.0 12/03/2015 1352   CALCIUM 8.3* 11/30/2015 0706   ALKPHOS 108 12/03/2015 1352   ALKPHOS 87 11/30/2015 0706   AST 17 12/03/2015 1352   AST 25 11/30/2015 0706   ALT 18 12/03/2015 1352   ALT 27 11/30/2015 0706   BILITOT 0.46 12/03/2015 1352   BILITOT 0.6 11/30/2015 0706       RADIOGRAPHIC STUDIES: I have personally reviewed the radiological images as listed and agreed with the findings in the report. Ct Abdomen Pelvis W Contrast  12/08/2015  CLINICAL DATA:  70 year old female with a history of upper abdominal abscess. Drainage was performed 11/25/2015. The patient reports today scant fluid output. EXAM: CT ABDOMEN AND PELVIS WITH CONTRAST TECHNIQUE: Multidetector CT imaging of the abdomen and pelvis was performed using the standard protocol following bolus administration of intravenous contrast. CONTRAST:  169m ISOVUE-300 IOPAMIDOL (ISOVUE-300) INJECTION 61% COMPARISON:  11/25/2015 11/24/2015 FINDINGS: Lower chest: Unremarkable appearance of the superficial soft tissues of the chest. 8 mm nodule at the left costophrenic angle, which does appear on the sagittal reformatted images to have significant volumetric size likely 8 mm in cranial caudal dimension. This was in a region of prior inflammatory changes on the CT 11/24/2015. Heart size within normal limits.  No pericardial fluid/ thickening. Abdomen/ pelvis: Percutaneous surgical drain within the epigastric region overlying the anterior margin of the liver. There is no significant residual fluid at the prior abscess cavity. Unremarkable liver, unremarkable gallbladder. Unremarkable spleen. Unremarkable bilateral adrenal glands. Unremarkable pancreas. Unremarkable left kidney. Low-density lesion within the right kidney measures 11 mm, compatible with a benign cyst. This was present on the comparison CT of  11/24/2015. Unremarkable appearance of small bowel and colon without evidence of obstruction or transition point. Percutaneous gastrostomy tube within the upper abdomen of well positioned. Surgical clips within the anatomic pelvis. Unremarkable appearance of the urinary bladder. Surgical changes of hysterectomy. Scattered vascular calcifications of the abdominal aorta. No aneurysm or dissection flap. Musculoskeletal: No displaced fracture. Multilevel degenerative disc disease with associated facet disease. No significant bony canal narrowing. Degenerative changes of the bilateral hips. IMPRESSION: Complete resolution of upper abdominal abscess with percutaneous drain in position. Percutaneous gastrostomy tube remains well positioned. There is a new 8 mm nodule at the left lung base. This has developed since the comparison PET-CT of 10/14/2015, and was in a region of inflammatory changes on the prior abdominal CT. While this is most likely postinflammatory, a short-term follow-up chest CT is recommended in 3 months given the patient's history of malignancy, to assure resolution and rule out metastatic disease. Signed, JDulcy Fanny WEarleen Newport DO [facility] Radiology Electronically Signed   By: Corrie Mckusick D.O.   On: 12/08/2015 13:03     ASSESSMENT & PLAN:  Cancer of base of tongue ([place]) She is doing well with radiation only. The intra-abdominal drainage tube has been removed. We review her imaging study at tumor board today which showed nonspecific lung nodule at the left lung base, cause unknown. We will repeat imaging study in a few months to follow I will see her back next week for further supportive care  Infection with methicillin-resistant Staphylococcus aureus (MRSA) I was surprised to learn that the dose of vancomycin was changed to 1750 mg daily. Currently, IV vancomycin is managed through [facility] care Her last dose of treatment should be on  12/18/2015  Severe protein-calorie malnutrition ([place]) This is slowly improving. I recommend oral intake as tolerated. She will visit with dietitian today for further management of tube feeds    No orders of the defined types were placed in this encounter.   All questions were answered. The patient knows to call the clinic with any problems, questions or concerns. No barriers to learning was detected. I spent 15 minutes counseling the patient face to face. The total time spent in the appointment was 20 minutes and more than 50% was on counseling and review of test results     [facility], [place], MD 12/09/2015 11:34 AM

## 2015-12-09 NOTE — Telephone Encounter (Signed)
Gave and printed appt sched an [facility] for pt for march

## 2015-12-09 NOTE — Assessment & Plan Note (Signed)
I was surprised to learn that the dose of vancomycin was changed to 1750 mg daily. Currently, IV vancomycin is managed through [facility] care Her last dose of treatment should be on 12/18/2015

## 2015-12-10 ENCOUNTER — Ambulatory Visit
Admission: RE | Admit: 2015-12-10 | Discharge: 2015-12-10 | Disposition: A | Discharge status: Home / Self Care | Payer: 59 | Source: Ambulatory Visit | Attending: Radiation Oncology | Admitting: Radiation Oncology

## 2015-12-10 ENCOUNTER — Ambulatory Visit: Payer: 59

## 2015-12-10 ENCOUNTER — Telehealth: Payer: Self-pay | Admitting: *Deleted

## 2015-12-10 DIAGNOSIS — L309 Dermatitis, unspecified: Secondary | ICD-10-CM | POA: Diagnosis not present

## 2015-12-10 DIAGNOSIS — E43 Unspecified severe protein-calorie malnutrition: Secondary | ICD-10-CM | POA: Diagnosis not present

## 2015-12-10 DIAGNOSIS — R5081 Fever presenting with conditions classified elsewhere: Secondary | ICD-10-CM | POA: Diagnosis not present

## 2015-12-10 DIAGNOSIS — E785 Hyperlipidemia, unspecified: Secondary | ICD-10-CM | POA: Diagnosis not present

## 2015-12-10 DIAGNOSIS — I10 Essential (primary) hypertension: Secondary | ICD-10-CM | POA: Diagnosis not present

## 2015-12-10 DIAGNOSIS — L0291 Cutaneous abscess, unspecified: Secondary | ICD-10-CM | POA: Diagnosis not present

## 2015-12-10 DIAGNOSIS — Z510 Encounter for antineoplastic radiation therapy: Secondary | ICD-10-CM | POA: Diagnosis not present

## 2015-12-10 DIAGNOSIS — C01 Malignant neoplasm of base of tongue: Secondary | ICD-10-CM | POA: Diagnosis not present

## 2015-12-10 DIAGNOSIS — Z809 Family history of malignant neoplasm, unspecified: Secondary | ICD-10-CM | POA: Diagnosis not present

## 2015-12-10 DIAGNOSIS — D563 Thalassemia minor: Secondary | ICD-10-CM | POA: Diagnosis not present

## 2015-12-10 NOTE — Telephone Encounter (Signed)
  Oncology Nurse Navigator Documentation  Navigator Location: [facility] (12/10/15 1234)   Telephone: Outgoing Call (12/10/15 1234)       Per Dr. Alvy Bimler, called Debra Farrell to inform her that lab results received and reviewed by Dr. Alvy Bimler were good.  [name] voiced understanding, indicated she requested [facility] to routinely fax labs results to Dr. Alvy Bimler.  Gayleen Orem, RN, BSN, [place] at [place] [phone]                                     Time Spent with Patient: 15 (12/10/15 1234)

## 2015-12-11 ENCOUNTER — Ambulatory Visit
Admission: RE | Admit: 2015-12-11 | Discharge: 2015-12-11 | Disposition: A | Discharge status: Home / Self Care | Payer: 59 | Source: Ambulatory Visit | Attending: Radiation Oncology | Admitting: Radiation Oncology

## 2015-12-11 ENCOUNTER — Ambulatory Visit: Payer: 59 | Admitting: Nutrition

## 2015-12-11 ENCOUNTER — Ambulatory Visit: Payer: 59

## 2015-12-11 DIAGNOSIS — C01 Malignant neoplasm of base of tongue: Secondary | ICD-10-CM | POA: Diagnosis not present

## 2015-12-11 DIAGNOSIS — E785 Hyperlipidemia, unspecified: Secondary | ICD-10-CM | POA: Diagnosis not present

## 2015-12-11 DIAGNOSIS — L309 Dermatitis, unspecified: Secondary | ICD-10-CM | POA: Diagnosis not present

## 2015-12-11 DIAGNOSIS — Z809 Family history of malignant neoplasm, unspecified: Secondary | ICD-10-CM | POA: Diagnosis not present

## 2015-12-11 DIAGNOSIS — Z510 Encounter for antineoplastic radiation therapy: Secondary | ICD-10-CM | POA: Diagnosis not present

## 2015-12-11 DIAGNOSIS — I10 Essential (primary) hypertension: Secondary | ICD-10-CM | POA: Diagnosis not present

## 2015-12-11 DIAGNOSIS — D563 Thalassemia minor: Secondary | ICD-10-CM | POA: Diagnosis not present

## 2015-12-11 NOTE — Progress Notes (Signed)
Tube feeding follow-up completed with patient who is being treated for tongue cancer  Patient reports she feels well today  Weight decreased slightly and documented as 147.2 pounds. Patient reports feeling full after bolus feedings  Denies nausea, vomiting, constipation or diarrhea  Reports she is drinking a lot of water by mouth   Estimated nutrition needs: 2040-2300 calories, 95-108 grams protein, 2.3 L fluid.   Nutrition diagnosis: inadequate oral intake continues.  Diagnosis of inadequate enteral nutrition infusion has improved.  Intervention:  Patient educated to to try Osmolite 1.5 one can every 3 hours to total 6 cans daily. Patient will continue to flush feeding tube with 60 cc free water before and after bolus feedings. In addition patient will infuse 45 mL ProMod mixed with water via feeding tube once daily. Patient will continue to drink a minimum of 40 ounces of water daily. Patient verbalizes understanding and teach back method used   6 cans Osmolite 1.5 provides 2231 calories, 104 g protein, 2286 mL free water meeting 100%estimated needs.   Monitoring, evaluation, goals: patient will work to tolerate tube feeding and protein supplement at goal rate to promote weight gain.  Next visit: Wednesday March 22 after radiation therapy.  **Disclaimer: This note was dictated with voice recognition software. Similar sounding words can inadvertently be transcribed and this note may contain transcription errors which may not have been corrected upon publication of note.**

## 2015-12-14 ENCOUNTER — Ambulatory Visit
Admission: RE | Admit: 2015-12-14 | Discharge: 2015-12-14 | Disposition: A | Discharge status: Home / Self Care | Payer: 59 | Source: Ambulatory Visit | Attending: Radiation Oncology | Admitting: Radiation Oncology

## 2015-12-14 ENCOUNTER — Encounter: Payer: Self-pay | Admitting: Radiation Oncology

## 2015-12-14 ENCOUNTER — Ambulatory Visit: Payer: 59

## 2015-12-14 VITALS — BP 121/65 | HR 82 | Temp 98.0°F | Resp 16 | Ht 66.0 in | Wt 149.1 lb

## 2015-12-14 DIAGNOSIS — E785 Hyperlipidemia, unspecified: Secondary | ICD-10-CM | POA: Diagnosis not present

## 2015-12-14 DIAGNOSIS — Z809 Family history of malignant neoplasm, unspecified: Secondary | ICD-10-CM | POA: Diagnosis not present

## 2015-12-14 DIAGNOSIS — L309 Dermatitis, unspecified: Secondary | ICD-10-CM | POA: Diagnosis not present

## 2015-12-14 DIAGNOSIS — C01 Malignant neoplasm of base of tongue: Secondary | ICD-10-CM | POA: Diagnosis not present

## 2015-12-14 DIAGNOSIS — I10 Essential (primary) hypertension: Secondary | ICD-10-CM | POA: Diagnosis not present

## 2015-12-14 DIAGNOSIS — Z510 Encounter for antineoplastic radiation therapy: Secondary | ICD-10-CM | POA: Diagnosis not present

## 2015-12-14 DIAGNOSIS — K651 Peritoneal abscess: Secondary | ICD-10-CM | POA: Diagnosis not present

## 2015-12-14 DIAGNOSIS — D563 Thalassemia minor: Secondary | ICD-10-CM | POA: Diagnosis not present

## 2015-12-14 NOTE — Progress Notes (Signed)
   Weekly Management Note:  Outpatient    ICD-9-CM ICD-10-CM   1. 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  CBCT/MVCT images/Port film x-rays were reviewed.  The chart was checked.  Starting to have soreness at back of tongue. Weight stable.  Physical Findings:  Wt Readings from Last 3 Encounters:  12/14/15 149 lb 1.6 oz (67.631 kg)  12/11/15 147 lb 3.2 oz (66.769 kg)  12/09/15 149 lb 1.6 oz (67.631 kg)    height is '5\' 6"'$  (1.676 m) and weight is 149 lb 1.6 oz (67.631 kg). Her oral temperature is 98 F (36.7 C). Her blood pressure is 121/65 and her pulse is 82. Her respiration is 16 and oxygen saturation is 100%.  skin is hyperpigmented over neck.. + right neck mass;  whitish tongue, no obvious thrush  CBC    Component Value Date/Time   WBC 5.8 12/03/2015 1352   WBC 9.6 11/27/2015 0600   RBC 4.58 12/03/2015 1352   RBC 4.27 11/27/2015 0600   RBC 3.97 11/15/2015 0700   HGB 9.6* 12/03/2015 1352   HGB 9.5* 11/27/2015 0600   HCT 30.5* 12/03/2015 1352   HCT 28.8* 11/27/2015 0600   PLT 447* 12/03/2015 1352   PLT 314 11/27/2015 0600   MCV 66.7* 12/03/2015 1352   MCV 67.4* 11/27/2015 0600   MCH 21.0* 12/03/2015 1352   MCH 22.2* 11/27/2015 0600   MCHC 31.5 12/03/2015 1352   MCHC 33.0 11/27/2015 0600   RDW 25.3* 12/03/2015 1352   RDW 22.4* 11/27/2015 0600   LYMPHSABS 0.7* 12/03/2015 1352   LYMPHSABS 1.1 11/27/2015 0600   MONOABS 0.7 12/03/2015 1352   MONOABS 0.6 11/27/2015 0600   EOSABS 0.0 12/03/2015 1352   EOSABS 0.0 11/27/2015 0600   BASOSABS 0.0 12/03/2015 1352   BASOSABS 0.0 11/27/2015 0600     CMP     Component Value Date/Time   NA 134* 12/03/2015 1352   NA 130* 11/30/2015 0706   K 4.1 12/03/2015 1352   K 3.9 11/30/2015 0706   CL 98* 11/30/2015 0706   CO2 30* 12/03/2015 1352   CO2 27 11/30/2015 0706   GLUCOSE 159* 12/03/2015 1352   GLUCOSE 110* 11/30/2015 0706   BUN 21.3 12/03/2015 1352   BUN 19 11/30/2015 0706   CREATININE 0.9 12/03/2015 1352   CREATININE 0.64 11/30/2015 0706   CREATININE 1.04 04/07/2015 1536   CALCIUM 9.0 12/03/2015 1352   CALCIUM 8.3* 11/30/2015 0706   PROT 6.8 12/03/2015 1352   PROT 6.0* 11/30/2015 0706   ALBUMIN 2.7* 12/03/2015 1352   ALBUMIN 2.3* 11/30/2015 0706   AST 17 12/03/2015 1352   AST 25 11/30/2015 0706   ALT 18 12/03/2015 1352   ALT 27 11/30/2015 0706   ALKPHOS 108 12/03/2015 1352   ALKPHOS 87 11/30/2015 0706   BILITOT 0.46 12/03/2015 1352   BILITOT 0.6 11/30/2015 0706   GFRNONAA >60 11/30/2015 0706   GFRAA >60 11/30/2015 0706     Impression:  The patient is tolerating radiotherapy.   Plan:  Continue radiotherapy as planned.   Discussed use of lidocaine to soothe tongue. She'll start this.    -----------------------------------  [name], MD

## 2015-12-14 NOTE — Progress Notes (Signed)
Debra Farrell has received 21 fractions to the bottom B/L.  Denies pain this morning.  Doing tube feeding 6x day, no solid foods.  Having fatigue most of the day.  Mucosa looks good inside the mouth.  Has some soreness at the back of the tongue. Wt Readings from Last 3 Encounters:  12/14/15 149 lb 1.6 oz (67.631 kg)  12/11/15 147 lb 3.2 oz (66.769 kg)  12/09/15 149 lb 1.6 oz (67.631 kg)  BP 121/65 mmHg  Pulse 82  Temp(Src) 98 F (36.7 C) (Oral)  Resp 16  Ht '5\' 6"'$  (1.676 m)  Wt 149 lb 1.6 oz (67.631 kg)  BMI 24.08 kg/m2  SpO2 100%

## 2015-12-15 ENCOUNTER — Ambulatory Visit
Admission: RE | Admit: 2015-12-15 | Discharge: 2015-12-15 | Disposition: A | Discharge status: Home / Self Care | Payer: 59 | Source: Ambulatory Visit | Attending: Radiation Oncology | Admitting: Radiation Oncology

## 2015-12-15 ENCOUNTER — Ambulatory Visit: Payer: 59

## 2015-12-15 DIAGNOSIS — Z809 Family history of malignant neoplasm, unspecified: Secondary | ICD-10-CM | POA: Diagnosis not present

## 2015-12-15 DIAGNOSIS — C01 Malignant neoplasm of base of tongue: Secondary | ICD-10-CM | POA: Diagnosis not present

## 2015-12-15 DIAGNOSIS — Z510 Encounter for antineoplastic radiation therapy: Secondary | ICD-10-CM | POA: Diagnosis not present

## 2015-12-15 DIAGNOSIS — L309 Dermatitis, unspecified: Secondary | ICD-10-CM | POA: Diagnosis not present

## 2015-12-15 DIAGNOSIS — D563 Thalassemia minor: Secondary | ICD-10-CM | POA: Diagnosis not present

## 2015-12-15 DIAGNOSIS — I10 Essential (primary) hypertension: Secondary | ICD-10-CM | POA: Diagnosis not present

## 2015-12-15 DIAGNOSIS — E785 Hyperlipidemia, unspecified: Secondary | ICD-10-CM | POA: Diagnosis not present

## 2015-12-16 ENCOUNTER — Ambulatory Visit (HOSPITAL_BASED_OUTPATIENT_CLINIC_OR_DEPARTMENT_OTHER): Payer: 59 | Admitting: Hematology and Oncology

## 2015-12-16 ENCOUNTER — Encounter: Payer: Self-pay | Admitting: Hematology and Oncology

## 2015-12-16 ENCOUNTER — Ambulatory Visit
Admission: RE | Admit: 2015-12-16 | Discharge: 2015-12-16 | Disposition: A | Discharge status: Home / Self Care | Payer: 59 | Source: Ambulatory Visit | Attending: Radiation Oncology | Admitting: Radiation Oncology

## 2015-12-16 ENCOUNTER — Ambulatory Visit: Payer: 59

## 2015-12-16 ENCOUNTER — Encounter: Payer: Self-pay | Admitting: Nutrition

## 2015-12-16 ENCOUNTER — Other Ambulatory Visit: Payer: Self-pay | Admitting: Hematology and Oncology

## 2015-12-16 ENCOUNTER — Telehealth: Payer: Self-pay | Admitting: *Deleted

## 2015-12-16 VITALS — BP 126/59 | HR 90 | Temp 98.1°F | Resp 17 | Ht 66.0 in | Wt 149.3 lb

## 2015-12-16 DIAGNOSIS — R110 Nausea: Secondary | ICD-10-CM

## 2015-12-16 DIAGNOSIS — D563 Thalassemia minor: Secondary | ICD-10-CM | POA: Diagnosis not present

## 2015-12-16 DIAGNOSIS — C01 Malignant neoplasm of base of tongue: Secondary | ICD-10-CM

## 2015-12-16 DIAGNOSIS — E43 Unspecified severe protein-calorie malnutrition: Secondary | ICD-10-CM

## 2015-12-16 DIAGNOSIS — Z510 Encounter for antineoplastic radiation therapy: Secondary | ICD-10-CM | POA: Diagnosis not present

## 2015-12-16 DIAGNOSIS — A4902 Methicillin resistant Staphylococcus aureus infection, unspecified site: Secondary | ICD-10-CM | POA: Diagnosis not present

## 2015-12-16 DIAGNOSIS — D638 Anemia in other chronic diseases classified elsewhere: Secondary | ICD-10-CM | POA: Insufficient documentation

## 2015-12-16 DIAGNOSIS — I10 Essential (primary) hypertension: Secondary | ICD-10-CM

## 2015-12-16 DIAGNOSIS — Z809 Family history of malignant neoplasm, unspecified: Secondary | ICD-10-CM | POA: Diagnosis not present

## 2015-12-16 DIAGNOSIS — L309 Dermatitis, unspecified: Secondary | ICD-10-CM | POA: Diagnosis not present

## 2015-12-16 DIAGNOSIS — E785 Hyperlipidemia, unspecified: Secondary | ICD-10-CM | POA: Diagnosis not present

## 2015-12-16 HISTORY — DX: Nausea: R11.0

## 2015-12-16 HISTORY — DX: Anemia in other chronic diseases classified elsewhere: D63.8

## 2015-12-16 MED ORDER — METOCLOPRAMIDE HCL 10 MG PO TABS: 10.0000 mg | ORAL_TABLET | Freq: Three times a day (TID) | ORAL | Status: DC

## 2015-12-16 MED FILL — METOCLOPRAMIDE 10 MG TABLET: 10 | 22 days supply | Qty: 90 | Fill #0

## 2015-12-16 NOTE — Progress Notes (Signed)
Patient did not show up for nutrition appointment.  She called and stated she forgot to come to the appointment.

## 2015-12-16 NOTE — Assessment & Plan Note (Signed)
She is struggling with nutritional intake due to early satiety and difficulties tolerating 6 cans of nutritional supplement per day. I recommend close follow-up with nutritionist. I recommend a trial of Reglan half an hour before meals and for her to take Zofran when necessary after meals if she still has nausea, and encourage ambulation

## 2015-12-16 NOTE — Assessment & Plan Note (Signed)
Her blood pressure has normalized. I recommend discontinuation of amlodipine.

## 2015-12-16 NOTE — Progress Notes (Signed)
[facility] OFFICE PROGRESS NOTE  Patient Care Team: [name], MD as PCP - General [name], RN as Oncology Nurse Navigator [name], MD as Consulting Physician (Hematology and Oncology) [name], MD as Attending Physician (Radiation Oncology) [name], RD as Dietitian (Nutrition)  SUMMARY OF ONCOLOGIC HISTORY: Oncology History   Cancer of base of tongue [facility])   Staging form: Lip and Oral Cavity, AJCC 7th Edition     Clinical stage from 10/14/2015: Stage IVA (T2, N2b, M0) - Signed by [name], MD on 10/19/2015       Cancer of base of tongue ([place])   09/29/2015 Pathology Results Accession: [id number] FNA right neck showed necrotic cells suspicious for squamous cell cancer   09/29/2015 Pathology Results Accession: [id number] Mouth biopsy showed pyogenic granuloma   10/08/2015 Imaging CT neck showed 1. 3 cm right tongue base mass consistent with malignancy.2. Necrotic right level II and III nodal metastases.3. Enlarged right thyroid lobe containing multiple nodules.   10/14/2015 Imaging PET scan showed 1. Hypermetabolic right tongue base mass with hypermetabolic right level II and III adenopathy. Small left level III lymph node appears mildly hypermetabolic as well.2. A few scattered tiny pulmonary nodules are too small for PETresolution   10/16/2015 Imaging [place] Head/Neck:  1) Multiple thyroid lesions bilaterally; dominant nodule in right lower pole measures 22 mm and meets fine-needle aspiration criteria. 2) Complex mass within the soft tissues of the right side of neck worrisome for metastatic malignancy.   10/26/2015 Pathology Results Accession [id number]:  Thyroid FNA - Consistent with benign follicular nodule.   10/28/2015 Surgery Multiple tooth extractions (26), right tongue base biopsy.   10/28/2015 Pathology Results Accession [id number]:  Tongue, right base -  Infiltrative SCC, p16 positive.  Actinomyces infection.   11/05/2015 Procedure Port-a-cath and PEG placed.    11/11/2015 -  Radiation Therapy She received radiation   11/11/2015 - 11/12/2015 Chemotherapy She received high dose cisplatin. Treatment was discontinued permanently due to severe side-effects   11/13/2015 Adverse Reaction She presented with intractable nausea and vomiting   11/13/2015 - 11/30/2015 Hospital Admission She was hospitalized for intractable nausea, vomiting and developed acute renal failure, reversed with aggressive IV fluid hydration. Subsequently, she was found to have intra-abdominal infection with MRSA requiring placement of drainage tube and IV ABx   11/24/2015 Imaging CT scan showed large abdominal abscess   11/25/2015 Procedure She had placement of drain   12/08/2015 Procedure Intra-abdominal drain was removed   12/08/2015 Imaging CT abdomen showed resolution of abscess and a new left lower base lung nodules    INTERVAL HISTORY: Please see below for problem oriented charting. She is seen as part of her weekly supportive care visit. She denies pain. She complained of persistent nausea and difficulties tolerating 6 cans of nutritional supplement She denies further fevers or chills Denies neck pain or mouth sores  REVIEW OF SYSTEMS:   Constitutional: Denies fevers, chills or abnormal weight loss Eyes: Denies blurriness of vision Ears, nose, mouth, throat, and face: Denies mucositis or sore throat Respiratory: Denies cough, dyspnea or wheezes Cardiovascular: Denies palpitation, chest discomfort or lower extremity swelling Skin: Denies abnormal skin rashes Lymphatics: Denies new lymphadenopathy or easy bruising Neurological:Denies numbness, tingling or new weaknesses Behavioral/Psych: Mood is stable, no new changes  All other systems were reviewed with the patient and are negative.  I have reviewed the past medical history, past surgical history, social history and family history with the patient and they are unchanged  from previous note.  ALLERGIES:  is allergic to morphine  and related.  MEDICATIONS:  Current Outpatient Prescriptions  Medication Sig Dispense Refill  . amLODipine (NORVASC) 5 MG tablet Place 1 tablet (5 mg total) into feeding tube 2 (two) times daily. 60 tablet 6  . lidocaine-prilocaine (EMLA) cream Apply to affected area once 30 g 3  . Nutritional Supplements (FEEDING SUPPLEMENT, OSMOLITE 1.5 CAL,) LIQD Osmolite 1.5, 1.5 cans 4 times daily with 60 cc free water before and after bolus feeding. Give 45 ml Liquid Promod (or equivalent) once daily with 240 cc water flush after protein supplement. Give additional 240 cc free water flushes 4 times daily. 1422 mL 0  . ondansetron (ZOFRAN) 8 MG tablet Take 1 tablet (8 mg total) by mouth 2 (two) times daily as needed. Start on the third day after chemotherapy. 30 tablet 1  . sucralfate (CARAFATE) 1 g tablet Dissolve 1 tablet in 10 mL H20 and swallow 30 min prior to meals and bedtime. 30 tablet 5  . vancomycin (VANCOCIN) 1 GM/200ML SOLN Inject 200 mLs (1,000 mg total) into the vein every 12 (twelve) hours. 4000 mL 0  . metoCLOPramide (REGLAN) 10 MG tablet Take 1 tablet (10 mg total) by mouth 4 (four) times daily -  before meals and at bedtime. 90 tablet 4   No current facility-administered medications for this visit.    PHYSICAL EXAMINATION: ECOG PERFORMANCE STATUS: 1 - Symptomatic but completely ambulatory  Filed Vitals:   12/16/15 0846  BP: 126/59  Pulse: 90  Temp: 98.1 F (36.7 C)  Resp: 17   Filed Weights   12/16/15 0846  Weight: 149 lb 4.8 oz (67.722 kg)    GENERAL:alert, no distress and comfortable SKIN: skin color, texture, turgor are normal, no rashes or significant lesions EYES: normal, Conjunctiva are pink and non-injected, sclera clear OROPHARYNX:no exudate, no erythema and lips, buccal mucosa, and tongue normal  NECK: supple, thyroid normal size, non-tender, without nodularity LYMPH:  She has persistent lymphadenopathy on the right side of the neck, unchanged  LUNGS: clear to  auscultation and percussion with normal breathing effort HEART: regular rate & rhythm and no murmurs and no lower extremity edema ABDOMEN:abdomen soft, non-tender and normal bowel sounds. Feeding tube site looks okay Musculoskeletal:no cyanosis of digits and no clubbing  NEURO: alert & oriented x 3 with fluent speech, no focal motor/sensory deficits  LABORATORY DATA:  I have reviewed the data as listed    Component Value Date/Time   NA 134* 12/03/2015 1352   NA 130* 11/30/2015 0706   K 4.1 12/03/2015 1352   K 3.9 11/30/2015 0706   CL 98* 11/30/2015 0706   CO2 30* 12/03/2015 1352   CO2 27 11/30/2015 0706   GLUCOSE 159* 12/03/2015 1352   GLUCOSE 110* 11/30/2015 0706   BUN 21.3 12/03/2015 1352   BUN 19 11/30/2015 0706   CREATININE 0.9 12/03/2015 1352   CREATININE 0.64 11/30/2015 0706   CREATININE 1.04 04/07/2015 1536   CALCIUM 9.0 12/03/2015 1352   CALCIUM 8.3* 11/30/2015 0706   PROT 6.8 12/03/2015 1352   PROT 6.0* 11/30/2015 0706   ALBUMIN 2.7* 12/03/2015 1352   ALBUMIN 2.3* 11/30/2015 0706   AST 17 12/03/2015 1352   AST 25 11/30/2015 0706   ALT 18 12/03/2015 1352   ALT 27 11/30/2015 0706   ALKPHOS 108 12/03/2015 1352   ALKPHOS 87 11/30/2015 0706   BILITOT 0.46 12/03/2015 1352   BILITOT 0.6 11/30/2015 0706   GFRNONAA >60 11/30/2015 7371  GFRAA >60 11/30/2015 0706    No results found for: SPEP, UPEP  Lab Results  Component Value Date   WBC 5.8 12/03/2015   NEUTROABS 4.3 12/03/2015   HGB 9.6* 12/03/2015   HCT 30.5* 12/03/2015   MCV 66.7* 12/03/2015   PLT 447* 12/03/2015      Chemistry      Component Value Date/Time   NA 134* 12/03/2015 1352   NA 130* 11/30/2015 0706   K 4.1 12/03/2015 1352   K 3.9 11/30/2015 0706   CL 98* 11/30/2015 0706   CO2 30* 12/03/2015 1352   CO2 27 11/30/2015 0706   BUN 21.3 12/03/2015 1352   BUN 19 11/30/2015 0706   CREATININE 0.9 12/03/2015 1352   CREATININE 0.64 11/30/2015 0706   CREATININE 1.04 04/07/2015 1536       Component Value Date/Time   CALCIUM 9.0 12/03/2015 1352   CALCIUM 8.3* 11/30/2015 0706   ALKPHOS 108 12/03/2015 1352   ALKPHOS 87 11/30/2015 0706   AST 17 12/03/2015 1352   AST 25 11/30/2015 0706   ALT 18 12/03/2015 1352   ALT 27 11/30/2015 0706   BILITOT 0.46 12/03/2015 1352   BILITOT 0.6 11/30/2015 0706     ASSESSMENT & PLAN:  Cancer of base of tongue ([place]) She is doing well, apart from nausea. Continue aggressive supportive care. I will see her next week for further assessment  Infection with methicillin-resistant Staphylococcus aureus (MRSA) I was surprised to learn that the dose of vancomycin was changed to 1750 mg daily. Currently, IV vancomycin is managed through [place] care Her last dose of treatment should be on 12/18/2015    Severe protein-calorie malnutrition ([place]) She is struggling with nutritional intake due to early satiety and difficulties tolerating 6 cans of nutritional supplement per day. I recommend close follow-up with nutritionist. I recommend a trial of Reglan half an hour before meals and for her to take Zofran when necessary after meals if she still has nausea, and encourage ambulation  Essential hypertension Her blood pressure has normalized. I recommend discontinuation of amlodipine.  Nausea without vomiting She has severe nausea without vomiting. I recommend trial of Reglan 4 times a day before meals and to take Zofran as needed I will reassess next week   No orders of the defined types were placed in this encounter.   All questions were answered. The patient knows to call the clinic with any problems, questions or concerns. No barriers to learning was detected. I spent 20 minutes counseling the patient face to face. The total time spent in the appointment was 25 minutes and more than 50% was on counseling and review of test results     [facility], [place], MD 12/16/2015 9:45 AM

## 2015-12-16 NOTE — Assessment & Plan Note (Signed)
I was surprised to learn that the dose of vancomycin was changed to 1750 mg daily. Currently, IV vancomycin is managed through [employer] care Her last dose of treatment should be on 12/18/2015

## 2015-12-16 NOTE — Assessment & Plan Note (Signed)
She is doing well, apart from nausea. Continue aggressive supportive care. I will see her next week for further assessment

## 2015-12-16 NOTE — Assessment & Plan Note (Signed)
She has severe nausea without vomiting. I recommend trial of Reglan 4 times a day before meals and to take Zofran as needed I will reassess next week

## 2015-12-16 NOTE — Telephone Encounter (Signed)
  Oncology Nurse Navigator Documentation  Navigator Location: [facility] (12/16/15 1107) Navigator Encounter Type: Telephone (12/16/15 1107) Telephone: Lahoma Crocker Call;Appt Confirmation/Clarification (12/16/15 1107)             Barriers/Navigation Needs: Coordination of Care (12/16/15 1107)   Interventions: Coordination of Care (12/16/15 1107)     Call Ms . Deeg in follow-up to her missed nutrition appt with Dory [place].  She indicated she was well on her way home after RT when she realized she had missed the appt, apologized.  I inquired about her attendance at next Tuesday morning's [place] following her 0800 Tomo treatment, explained she could have recommended follow-up appts with Raford Pitcher and Garald Balding, SLP.  She indicated her availability, she voiced understanding to come to Radiation Waiting for [place] following XRT.  Gayleen Orem, RN, BSN, [place] at [place] [phone]                     Time Spent with Patient: 15 (12/16/15 1107)

## 2015-12-17 ENCOUNTER — Ambulatory Visit
Admission: RE | Admit: 2015-12-17 | Discharge: 2015-12-17 | Disposition: A | Discharge status: Home / Self Care | Payer: 59 | Source: Ambulatory Visit | Attending: Radiation Oncology | Admitting: Radiation Oncology

## 2015-12-17 ENCOUNTER — Ambulatory Visit: Payer: 59

## 2015-12-17 DIAGNOSIS — L309 Dermatitis, unspecified: Secondary | ICD-10-CM | POA: Diagnosis not present

## 2015-12-17 DIAGNOSIS — Z510 Encounter for antineoplastic radiation therapy: Secondary | ICD-10-CM | POA: Diagnosis not present

## 2015-12-17 DIAGNOSIS — C01 Malignant neoplasm of base of tongue: Secondary | ICD-10-CM | POA: Diagnosis not present

## 2015-12-17 DIAGNOSIS — D563 Thalassemia minor: Secondary | ICD-10-CM | POA: Diagnosis not present

## 2015-12-17 DIAGNOSIS — Z809 Family history of malignant neoplasm, unspecified: Secondary | ICD-10-CM | POA: Diagnosis not present

## 2015-12-17 DIAGNOSIS — I10 Essential (primary) hypertension: Secondary | ICD-10-CM | POA: Diagnosis not present

## 2015-12-17 DIAGNOSIS — E785 Hyperlipidemia, unspecified: Secondary | ICD-10-CM | POA: Diagnosis not present

## 2015-12-18 ENCOUNTER — Ambulatory Visit: Payer: 59

## 2015-12-18 ENCOUNTER — Ambulatory Visit
Admission: RE | Admit: 2015-12-18 | Discharge: 2015-12-18 | Disposition: A | Discharge status: Home / Self Care | Payer: 59 | Source: Ambulatory Visit | Attending: Radiation Oncology | Admitting: Radiation Oncology

## 2015-12-18 DIAGNOSIS — L309 Dermatitis, unspecified: Secondary | ICD-10-CM | POA: Diagnosis not present

## 2015-12-18 DIAGNOSIS — I10 Essential (primary) hypertension: Secondary | ICD-10-CM | POA: Diagnosis not present

## 2015-12-18 DIAGNOSIS — Z510 Encounter for antineoplastic radiation therapy: Secondary | ICD-10-CM | POA: Diagnosis not present

## 2015-12-18 DIAGNOSIS — D563 Thalassemia minor: Secondary | ICD-10-CM | POA: Diagnosis not present

## 2015-12-18 DIAGNOSIS — C01 Malignant neoplasm of base of tongue: Secondary | ICD-10-CM | POA: Diagnosis not present

## 2015-12-18 DIAGNOSIS — E785 Hyperlipidemia, unspecified: Secondary | ICD-10-CM | POA: Diagnosis not present

## 2015-12-18 DIAGNOSIS — Z809 Family history of malignant neoplasm, unspecified: Secondary | ICD-10-CM | POA: Diagnosis not present

## 2015-12-21 ENCOUNTER — Ambulatory Visit
Admission: RE | Admit: 2015-12-21 | Discharge: 2015-12-21 | Disposition: A | Discharge status: Home / Self Care | Payer: 59 | Source: Ambulatory Visit | Attending: Radiation Oncology | Admitting: Radiation Oncology

## 2015-12-21 ENCOUNTER — Ambulatory Visit: Payer: 59

## 2015-12-21 ENCOUNTER — Encounter: Payer: Self-pay | Admitting: *Deleted

## 2015-12-21 ENCOUNTER — Encounter: Payer: Self-pay | Admitting: Radiation Oncology

## 2015-12-21 VITALS — BP 132/74 | HR 84 | Temp 98.4°F | Wt 146.6 lb

## 2015-12-21 DIAGNOSIS — C01 Malignant neoplasm of base of tongue: Secondary | ICD-10-CM | POA: Diagnosis not present

## 2015-12-21 DIAGNOSIS — Z510 Encounter for antineoplastic radiation therapy: Secondary | ICD-10-CM | POA: Diagnosis not present

## 2015-12-21 DIAGNOSIS — L309 Dermatitis, unspecified: Secondary | ICD-10-CM | POA: Diagnosis not present

## 2015-12-21 DIAGNOSIS — Z809 Family history of malignant neoplasm, unspecified: Secondary | ICD-10-CM | POA: Diagnosis not present

## 2015-12-21 DIAGNOSIS — I10 Essential (primary) hypertension: Secondary | ICD-10-CM | POA: Diagnosis not present

## 2015-12-21 DIAGNOSIS — E785 Hyperlipidemia, unspecified: Secondary | ICD-10-CM | POA: Diagnosis not present

## 2015-12-21 DIAGNOSIS — D563 Thalassemia minor: Secondary | ICD-10-CM | POA: Diagnosis not present

## 2015-12-21 NOTE — Progress Notes (Signed)
   Weekly Management Note:  Outpatient    ICD-9-CM ICD-10-CM   1. 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  CBCT/MVCT images/Port film x-rays were reviewed.  The chart was checked.  Doing relatively well.  Nausea around 4pm but this is a little better w/ Reglan that was started last week.  Pain controlled.  Drinking water and instilling water w/ 6 cans osmolite daily. Applying sonafine to neck 5 xs daily.  Physical Findings:  Wt Readings from Last 3 Encounters:  12/21/15 146 lb 9.6 oz (66.497 kg)  12/16/15 149 lb 4.8 oz (67.722 kg)  12/14/15 149 lb 1.6 oz (67.631 kg)    weight is 146 lb 9.6 oz (66.497 kg). Her temperature is 98.4 F (36.9 C). Her blood pressure is 132/74 and her pulse is 84.  skin is dry / hyperpigmented over neck.. + right neck mass;  Stable whitish tongue, no obvious thrush  CBC    Component Value Date/Time   WBC 5.8 12/03/2015 1352   WBC 9.6 11/27/2015 0600   RBC 4.58 12/03/2015 1352   RBC 4.27 11/27/2015 0600   RBC 3.97 11/15/2015 0700   HGB 9.6* 12/03/2015 1352   HGB 9.5* 11/27/2015 0600   HCT 30.5* 12/03/2015 1352   HCT 28.8* 11/27/2015 0600   PLT 447* 12/03/2015 1352   PLT 314 11/27/2015 0600   MCV 66.7* 12/03/2015 1352   MCV 67.4* 11/27/2015 0600   MCH 21.0* 12/03/2015 1352   MCH 22.2* 11/27/2015 0600   MCHC 31.5 12/03/2015 1352   MCHC 33.0 11/27/2015 0600   RDW 25.3* 12/03/2015 1352   RDW 22.4* 11/27/2015 0600   LYMPHSABS 0.7* 12/03/2015 1352   LYMPHSABS 1.1 11/27/2015 0600   MONOABS 0.7 12/03/2015 1352   MONOABS 0.6 11/27/2015 0600   EOSABS 0.0 12/03/2015 1352   EOSABS 0.0 11/27/2015 0600   BASOSABS 0.0 12/03/2015 1352   BASOSABS 0.0 11/27/2015 0600     CMP     Component Value Date/Time   NA 134* 12/03/2015 1352   NA 130* 11/30/2015 0706   K 4.1 12/03/2015 1352   K 3.9 11/30/2015 0706   CL 98* 11/30/2015  0706   CO2 30* 12/03/2015 1352   CO2 27 11/30/2015 0706   GLUCOSE 159* 12/03/2015 1352   GLUCOSE 110* 11/30/2015 0706   BUN 21.3 12/03/2015 1352   BUN 19 11/30/2015 0706   CREATININE 0.9 12/03/2015 1352   CREATININE 0.64 11/30/2015 0706   CREATININE 1.04 04/07/2015 1536   CALCIUM 9.0 12/03/2015 1352   CALCIUM 8.3* 11/30/2015 0706   PROT 6.8 12/03/2015 1352   PROT 6.0* 11/30/2015 0706   ALBUMIN 2.7* 12/03/2015 1352   ALBUMIN 2.3* 11/30/2015 0706   AST 17 12/03/2015 1352   AST 25 11/30/2015 0706   ALT 18 12/03/2015 1352   ALT 27 11/30/2015 0706   ALKPHOS 108 12/03/2015 1352   ALKPHOS 87 11/30/2015 0706   BILITOT 0.46 12/03/2015 1352   BILITOT 0.6 11/30/2015 0706   GFRNONAA >60 11/30/2015 0706   GFRAA >60 11/30/2015 0706     Impression:  The patient is tolerating radiotherapy.   Plan:  Continue radiotherapy as planned.      -----------------------------------  [name], MD

## 2015-12-21 NOTE — Progress Notes (Signed)
  Oncology Nurse Navigator Documentation  Navigator Location: [facility] (12/21/15 0750) Navigator Encounter Type: Treatment (12/21/15 0750)                   Interventions: Coordination of Care (12/21/15 0750)       Met with Ms. Spagnolo before today's Tomo tmt.  Reminded her of her attendance at [facility] tomorrow morning after XRT.  She voiced understanding.  I provided her invitations for the upcoming 4/17 [facility] and 4[facility]25 [facility], discussed the benefits of participating, encouraged her to attend.  She voiced interest.  I recognized how well she has been doing with her tmts, recognized that her final Tomo tmt is April 7th.    She denied any needs or concerns; I encouraged her to contact me if that changes before I see her next, she verbalized understanding.  Gayleen Orem, RN, BSN, [place] at [place] [phone]                   Time Spent with Patient: 15 (12/21/15 0750)

## 2015-12-21 NOTE — Progress Notes (Signed)
Debra Farrell is here for her 26th fraction of radiation to her Base of Tongue, Neck, Oropharynx. She reports she is only taking water orally. She is instilling 6 cans of osmolite daily through her PEG tube. She also reports instilling free water through her PEG tube at 240 cc's 4 times a day. She also uses a protein powder supplement once daily in an osmolite can. She does have some nausea with her evening tube feeds and she will alternate between zofran and compazine for relief of this. She is having daily bowel movements.  Her mouth is dry and she is using biotene mouth rinses once in a while. The skin to her neck is hyperpigmented, red, with some areas of dry skin noted. She does report fatigue in the afternoons after a busy morning.   BP 132/74 mmHg  Pulse 84  Temp(Src) 98.4 F (36.9 C)  Wt 146 lb 9.6 oz (66.497 kg)   Wt Readings from Last 3 Encounters:  12/21/15 146 lb 9.6 oz (66.497 kg)  12/16/15 149 lb 4.8 oz (67.722 kg)  12/14/15 149 lb 1.6 oz (67.631 kg)

## 2015-12-22 ENCOUNTER — Ambulatory Visit: Payer: 59 | Attending: Radiation Oncology

## 2015-12-22 ENCOUNTER — Ambulatory Visit: Payer: 59 | Admitting: Nutrition

## 2015-12-22 ENCOUNTER — Encounter: Payer: Self-pay | Admitting: *Deleted

## 2015-12-22 ENCOUNTER — Ambulatory Visit
Admission: RE | Admit: 2015-12-22 | Discharge: 2015-12-22 | Disposition: A | Discharge status: Home / Self Care | Payer: 59 | Source: Ambulatory Visit | Attending: Radiation Oncology | Admitting: Radiation Oncology

## 2015-12-22 ENCOUNTER — Ambulatory Visit: Payer: 59

## 2015-12-22 DIAGNOSIS — D563 Thalassemia minor: Secondary | ICD-10-CM | POA: Diagnosis not present

## 2015-12-22 DIAGNOSIS — E785 Hyperlipidemia, unspecified: Secondary | ICD-10-CM | POA: Diagnosis not present

## 2015-12-22 DIAGNOSIS — R1310 Dysphagia, unspecified: Secondary | ICD-10-CM | POA: Insufficient documentation

## 2015-12-22 DIAGNOSIS — C01 Malignant neoplasm of base of tongue: Secondary | ICD-10-CM | POA: Diagnosis not present

## 2015-12-22 DIAGNOSIS — I10 Essential (primary) hypertension: Secondary | ICD-10-CM | POA: Diagnosis not present

## 2015-12-22 DIAGNOSIS — Z510 Encounter for antineoplastic radiation therapy: Secondary | ICD-10-CM | POA: Diagnosis not present

## 2015-12-22 DIAGNOSIS — Z809 Family history of malignant neoplasm, unspecified: Secondary | ICD-10-CM | POA: Diagnosis not present

## 2015-12-22 DIAGNOSIS — L309 Dermatitis, unspecified: Secondary | ICD-10-CM | POA: Diagnosis not present

## 2015-12-22 NOTE — Therapy (Signed)
[facility] [address] [place], [place], [address] Phone: [phone]   Fax:  [phone]  Speech Language Pathology Treatment  Patient Details  Name: Debra Farrell MRN: [id number] Date of Birth: 11-Oct-1945 Referring Provider: Lonie Peak MD  Encounter Date: 12/22/2015      End of Session - 12/22/15 0946    Visit Number 2   Number of Visits 4   Date for SLP Re-Evaluation 02/26/16   SLP Start Time 0852   SLP Stop Time  0930   SLP Time Calculation (min) 38 min   Activity Tolerance Patient tolerated treatment well      Past Medical History  Diagnosis Date  . Hypertension   . Hyperlipidemia   . Eczema   . Thalassemia minor   . 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  . Laceration 04/2014    around R ear, for falling out of bed & hitting nightstand  . Intractable nausea and vomiting 11/13/2015  . Abdominal abscess (HCC) 11/30/2015  . Infection with methicillin-resistant Staphylococcus aureus (MRSA) 11/30/2015  . Nausea without vomiting 12/16/2015  . Anemia in chronic illness 12/16/2015    Past Surgical History  Procedure Laterality Date  . Abdominal hysterectomy  1990    partial  . Incontinence surgery  1990, [id number]  . Rectocele repair    . Urocele      correction surgery  . Tonsillectomy      as a child  . Direct laryngoscopy N/A 10/28/2015    Procedure: DIRECT LARYNGOSCOPY WITH BIOPSY;  Surgeon: Flo Shanks, MD;  Location: [facility] OR;  Service: ENT;  Laterality: N/A;  . Esophagoscopy N/A 10/28/2015    Procedure: ESOPHAGOSCOPY;  Surgeon: Flo Shanks, MD;  Location: [facility] OR;  Service: ENT;  Laterality: N/A;  . Laryngoscopy and bronchoscopy N/A 10/28/2015    Procedure: BRONCHOSCOPY;  Surgeon: Flo Shanks, MD;  Location: [facility] OR;  Service: ENT;  Laterality: N/A;  . Multiple extractions with alveoloplasty N/A 10/28/2015    Procedure: Extraction of tooth #'s 2-12, 14,15,17,18,20-29, 31 with alveoloplasy and  mandibular left torus reduction;  Surgeon: Charlynne Pander, DDS;  Location: [facility] OR;  Service: Oral Surgery;  Laterality: N/A;    There were no vitals filed for this visit.  Visit Diagnosis: Dysphagia      Subjective Assessment - 12/22/15 0901    Subjective reported difficult with pharyngeal clearance with dys I items and slick solids (canned peaches). Warm liquids pass, and without s/s aspiration.               ADULT SLP TREATMENT - 12/22/15 0905    General Information   Behavior/Cognition Alert;Cooperative;Pleasant mood   Treatment Provided   Treatment provided Dysphagia   Dysphagia Treatment   Temperature Spikes Noted No   Respiratory Status Room air   Treatment Methods Therapeutic exercise;Patient/caregiver education;Skilled observation   Patient observed directly with PO's Yes   Type of PO's observed Thin liquids   Oral Phase Signs & Symptoms --  none noted   Pharyngeal Phase Signs & Symptoms Immediate throat clear  on 1/4 sips;pt reports cough does not happen regularly   Other treatment/comments No overt s/s aspiration PNA observed/reported. Pt told SLP 3 s/s aspiration PNA without cues. She req'd mod A usually with exercises; pt reports she is rarely doing these. SLP reiterated need to complete HEP twice a day and pt told SLP why this was necessary without cues. Pt with good/excellent pharyngeal clearance with liquids, but not with dys  I items. SLP encouraged pt to cont to do POs as much as possible.    Dysphagia Recommendations   Diet recommendations --  as tolerated   Compensations Slow rate;Small sips/bites   Progression Toward Goals   Progression toward goals Progressing toward goals          SLP Education - 12/22/15 0946    Education provided Yes   Education Details HEP, late effect head/neck radiation   Person(s) Educated Patient   Methods Explanation;Demonstration;Verbal cues   Comprehension Verbalized understanding;Returned demonstration;Verbal cues  required          SLP Short Term Goals - 12/22/15 0959    SLP SHORT TERM GOAL #1   Title pt will demo HEP with rare min A (renewed until 05-23-16)   Time 3   Period --  visits   Status On-going   SLP SHORT TERM GOAL #2   Title pt will provide SLP with rationale for doing HEP   Status Achieved          SLP Long Term Goals - 12/22/15 1000    SLP LONG TERM GOAL #1   Title pt will demo HEP over two sessions with modified independence (renewed until 05-23-16)   Time 4   Period --  visits   Status On-going   SLP LONG TERM GOAL #2   Title pt will tell SLP three s/s aspiration PNA with modifiedi independence   Status Achieved          Plan - 12/22/15 0949    Clinical Impression Statement Pt presents with swallowing decr'd due to mid-rad tx. No overt s/s aspiration PNA today. See goal section for goal update. She requires cont'd skilled ST needed to cont to assess pt with success with HEP after read treatment, and pt safety with POs.   Speech Therapy Frequency --  once approx every 4 weeks   Duration --  for five more visits   Treatment/Interventions Aspiration precaution training;Pharyngeal strengthening exercises;Oral motor exercises;Diet toleration management by SLP;Patient/family education;SLP instruction and feedback   Potential to Achieve Goals Good   Consulted and Agree with Plan of Care Patient        Problem List Patient Active Problem List   Diagnosis Date Noted  . Nausea without vomiting 12/16/2015  . Anemia in chronic illness 12/16/2015  . Drainage from gastrostomy tube site (HCC) 12/03/2015  . Abdominal abscess (HCC) 11/30/2015  . Infection with methicillin-resistant Staphylococcus aureus (MRSA) 11/30/2015  . Neutropenic fever (HCC)   . Abdominal pain   . Abscess   . Anemia due to other cause   . Renal failure   . Fever   . Hypophosphatemia 11/21/2015  . Acute renal failure (ARF) (HCC)   . Dehydration   . Hypokalemia   . Hypomagnesemia   . Severe  protein-calorie malnutrition (HCC) 11/14/2015  . Intractable nausea and vomiting 11/13/2015  . Gastritis, acute 11/13/2015  . Pain at surgical incision 11/10/2015  . Constipation due to opioid therapy 11/10/2015  . 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  . Skin lesion of back 03/21/2013  . Lipoma of axilla 03/21/2013  . Sleep disorder 03/17/2013  . Skin lesion 03/17/2013  . Health care maintenance 03/17/2013  . Right knee pain 04/05/2012  . Health maintenance examination 10/17/2011  . 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 Essential hypertension 10/17/2011  . Hyperlipidemia 10/17/2011  . Overweight(278.02) 10/17/2011  . Headache(784.0) 10/17/2011    Leldon Steege ,MS, CCC-SLP  12/22/2015, 11:51 AM  [facility] [address] [place], [place], [address] Phone: [phone]   Fax:  [phone]   Name: Debra Farrell MRN: [id number] Date of Birth: 03/10/1946

## 2015-12-22 NOTE — Progress Notes (Signed)
Head & Neck Multidisciplinary Clinic Clinical Social Work  Clinical Social Work met with patient at head & neck multidisciplinary clinic to offer support and assess for psychosocial needs. The patient has almost completed treatment.  Ms. Debra Farrell reported feeling discomfort and pain, but no emotional concerns at this time.  She shared her oldest daughter is her main support.  She has three daughters.  She has a granddaughter that lives with her, she has raised since the granddaughter was born.  Ms. Debra Farrell reported her granddaughter is coping well and is unsure she understands the magnitude of the patient's treatment.  CSW encouraged patient to attend the upcoming Head and Neck Finding Your New Normal class.  Clinical Social Work briefly discussed Clinical Social Work role and [employer] support programs/services.  Clinical Social Work encouraged patient to call with any additional questions or concerns.   Polo Riley, MSW, LCSW, OSW-C Clinical Social Worker [place] [phone]

## 2015-12-22 NOTE — Progress Notes (Signed)
Patient was seen for nutrition follow up in head and neck clinic. States she has 7 more radiation treatments for tongue cancer. Wt documented as 147.6 pounds on March 28. Patient reports some afternoon nausea now improved on reglan. Reports having some diarrhea today. Is tolerating approximately 5 cans of Osmolite 1.5 daily, infusing one can at a time. She is tolerating 45 mL of Promod daily via feeding tube. Complains of fatigue.  Estimated Nutrition Needs: 2040-2300 calories, 95-108 grams protein, 2.3 L fluid.  Nutrition Diagnosis:  Inadequate oral intake and inadequate enteral nutrition infusion continues.  Intervention: Educated patient to change bolus feedings to 1.5 cans osmolite 1.5 with 30 cc free water before and after QID. Patient prefers to do feedings at 9 am, 2 pm, 6 pm and 3 am. (states she is awake every day at 3 am and wants to do a feeding then.) Encouraged patient to walk around safely after bolus feedings to help with digestion. Continue reglan per MD.  6 cans Osmolite 1.5 plus Promod provides 2231 calories, 104 grams protein, 2286 mL free water.  Monitoring, Evaluation, Goals: Patient will tolerate increase in tube feedings to goal rate to meet greater than 90% estimated needs.  Next Visit: Tuesday, April 4, after radiation treatment.

## 2015-12-23 ENCOUNTER — Ambulatory Visit: Payer: Self-pay

## 2015-12-23 ENCOUNTER — Other Ambulatory Visit (HOSPITAL_BASED_OUTPATIENT_CLINIC_OR_DEPARTMENT_OTHER): Payer: 59

## 2015-12-23 ENCOUNTER — Encounter: Payer: Self-pay | Admitting: Nutrition

## 2015-12-23 ENCOUNTER — Ambulatory Visit
Admission: RE | Admit: 2015-12-23 | Discharge: 2015-12-23 | Disposition: A | Discharge status: Home / Self Care | Payer: 59 | Source: Ambulatory Visit | Attending: Radiation Oncology | Admitting: Radiation Oncology

## 2015-12-23 ENCOUNTER — Ambulatory Visit: Payer: 59

## 2015-12-23 DIAGNOSIS — E785 Hyperlipidemia, unspecified: Secondary | ICD-10-CM | POA: Diagnosis not present

## 2015-12-23 DIAGNOSIS — IMO0002 Reserved for concepts with insufficient information to code with codable children: Secondary | ICD-10-CM

## 2015-12-23 DIAGNOSIS — Z809 Family history of malignant neoplasm, unspecified: Secondary | ICD-10-CM | POA: Diagnosis not present

## 2015-12-23 DIAGNOSIS — I10 Essential (primary) hypertension: Secondary | ICD-10-CM | POA: Diagnosis not present

## 2015-12-23 DIAGNOSIS — Z510 Encounter for antineoplastic radiation therapy: Secondary | ICD-10-CM | POA: Diagnosis not present

## 2015-12-23 DIAGNOSIS — D563 Thalassemia minor: Secondary | ICD-10-CM | POA: Diagnosis not present

## 2015-12-23 DIAGNOSIS — L309 Dermatitis, unspecified: Secondary | ICD-10-CM | POA: Diagnosis not present

## 2015-12-23 DIAGNOSIS — C01 Malignant neoplasm of base of tongue: Secondary | ICD-10-CM

## 2015-12-23 DIAGNOSIS — A4902 Methicillin resistant Staphylococcus aureus infection, unspecified site: Secondary | ICD-10-CM

## 2015-12-23 DIAGNOSIS — D638 Anemia in other chronic diseases classified elsewhere: Secondary | ICD-10-CM

## 2015-12-23 LAB — COMPREHENSIVE METABOLIC PANEL
ALT: 12 U/L (ref 0–55)
AST: 15 U/L (ref 5–34)
Albumin: 3.3 g/dL — ABNORMAL LOW (ref 3.5–5.0)
Alkaline Phosphatase: 88 U/L (ref 40–150)
Anion Gap: 9 mEq/L (ref 3–11)
BUN: 36 mg/dL — ABNORMAL HIGH (ref 7.0–26.0)
CHLORIDE: 98 meq/L (ref 98–109)
CO2: 30 meq/L — AB (ref 22–29)
Calcium: 9.5 mg/dL (ref 8.4–10.4)
Creatinine: 1 mg/dL (ref 0.6–1.1)
EGFR: 56 mL/min/{1.73_m2} — AB (ref >90)
GLUCOSE: 144 mg/dL — AB (ref 70–140)
POTASSIUM: 4.3 meq/L (ref 3.5–5.1)
SODIUM: 136 meq/L (ref 136–145)
Total Bilirubin: 0.39 mg/dL (ref 0.20–1.20)
Total Protein: 6.5 g/dL (ref 6.4–8.3)

## 2015-12-23 LAB — CBC WITH DIFFERENTIAL/PLATELET
BASO%: 1 % (ref 0.0–2.0)
Basophils Absolute: 0 10*3/uL (ref 0.0–0.1)
EOS%: 3.8 % (ref 0.0–7.0)
Eosinophils Absolute: 0.2 10*3/uL (ref 0.0–0.5)
HCT: 25.9 % — ABNORMAL LOW (ref 34.8–46.6)
HGB: 8.2 g/dL — ABNORMAL LOW (ref 11.6–15.9)
LYMPH%: 8.1 % — ABNORMAL LOW (ref 14.0–49.7)
MCH: 20.8 pg — ABNORMAL LOW (ref 25.1–34.0)
MCHC: 31.8 g/dL (ref 31.5–36.0)
MCV: 65.5 fL — ABNORMAL LOW (ref 79.5–101.0)
MONO#: 0.7 10*3/uL (ref 0.1–0.9)
MONO%: 17.3 % — ABNORMAL HIGH (ref 0.0–14.0)
NEUT#: 2.8 10*3/uL (ref 1.5–6.5)
NEUT%: 69.8 % (ref 38.4–76.8)
Platelets: 156 10*3/uL (ref 145–400)
RBC: 3.96 10*6/uL (ref 3.70–5.45)
RDW: 23.9 % — ABNORMAL HIGH (ref 11.2–14.5)
WBC: 4.1 10*3/uL (ref 3.9–10.3)
lymph#: 0.3 10*3/uL — ABNORMAL LOW (ref 0.9–3.3)

## 2015-12-23 LAB — MAGNESIUM: Magnesium: 1.9 mg/dl (ref 1.5–2.5)

## 2015-12-23 NOTE — Progress Notes (Signed)
  Oncology Nurse Navigator Documentation  Navigator Location: [facility]-Med Onc (12/22/15 0845) Navigator Encounter Type: Clinic/MDC (12/22/15 0845)                   Interventions: Coordination of Care (12/22/15 0845)       To provide support and care continuity, met with Ms. Vigil during [facility]N [place].   Arrived her to designated Exam Room, obtained and documented VS.  Provided her verbal overview of [place] and the clinicians Dory Peru, RD; Garald Balding, SLP; Polo Riley, LCSW) who will be seeing her, encouraged her to ask questions during their time with her. Spoke with her at end of [facility], she denied any questions, needs, concerns.  Gayleen Orem, RN, BSN, [place] at [place] [phone]                    Time Spent with Patient: 30 (12/22/15 0845)

## 2015-12-24 ENCOUNTER — Encounter: Payer: Self-pay | Admitting: Hematology and Oncology

## 2015-12-24 ENCOUNTER — Ambulatory Visit
Admission: RE | Admit: 2015-12-24 | Discharge: 2015-12-24 | Disposition: A | Discharge status: Home / Self Care | Payer: 59 | Source: Ambulatory Visit | Attending: Radiation Oncology | Admitting: Radiation Oncology

## 2015-12-24 ENCOUNTER — Ambulatory Visit: Payer: 59

## 2015-12-24 ENCOUNTER — Encounter: Payer: Self-pay | Admitting: Oncology

## 2015-12-24 ENCOUNTER — Encounter: Payer: Self-pay | Admitting: Adult Health

## 2015-12-24 ENCOUNTER — Ambulatory Visit (HOSPITAL_BASED_OUTPATIENT_CLINIC_OR_DEPARTMENT_OTHER): Payer: 59 | Admitting: Hematology and Oncology

## 2015-12-24 ENCOUNTER — Encounter: Payer: Self-pay | Admitting: *Deleted

## 2015-12-24 ENCOUNTER — Telehealth: Payer: Self-pay | Admitting: Hematology and Oncology

## 2015-12-24 VITALS — BP 139/62 | HR 78 | Temp 98.0°F | Resp 18 | Ht 66.0 in | Wt 150.8 lb

## 2015-12-24 DIAGNOSIS — C01 Malignant neoplasm of base of tongue: Secondary | ICD-10-CM | POA: Diagnosis not present

## 2015-12-24 DIAGNOSIS — D6489 Other specified anemias: Secondary | ICD-10-CM

## 2015-12-24 DIAGNOSIS — L309 Dermatitis, unspecified: Secondary | ICD-10-CM | POA: Diagnosis not present

## 2015-12-24 DIAGNOSIS — E785 Hyperlipidemia, unspecified: Secondary | ICD-10-CM | POA: Diagnosis not present

## 2015-12-24 DIAGNOSIS — E43 Unspecified severe protein-calorie malnutrition: Secondary | ICD-10-CM

## 2015-12-24 DIAGNOSIS — I10 Essential (primary) hypertension: Secondary | ICD-10-CM | POA: Diagnosis not present

## 2015-12-24 DIAGNOSIS — Z510 Encounter for antineoplastic radiation therapy: Secondary | ICD-10-CM | POA: Diagnosis not present

## 2015-12-24 DIAGNOSIS — Z809 Family history of malignant neoplasm, unspecified: Secondary | ICD-10-CM | POA: Diagnosis not present

## 2015-12-24 DIAGNOSIS — D563 Thalassemia minor: Secondary | ICD-10-CM | POA: Diagnosis not present

## 2015-12-24 MED ORDER — RADIAPLEXRX EX GEL
Freq: Once | CUTANEOUS | Status: AC
  Administered 2015-12-24: 09:00:00 via TOPICAL

## 2015-12-24 NOTE — Assessment & Plan Note (Signed)
She is struggling with nutritional intake due to early satiety and difficulties tolerating 6 cans of nutritional supplement per day. I recommend close follow-up with nutritionist. Her recent blood work looks good and she is gaining weight. Continue to same. She will continue premedication with reglan before each feeding

## 2015-12-24 NOTE — Assessment & Plan Note (Signed)
This is likely anemia of chronic disease. The patient denies recent history of bleeding such as epistaxis, hematuria or hematochezia. She is asymptomatic from the anemia. We will observe for now.  She does not require transfusion now.   

## 2015-12-24 NOTE — Progress Notes (Signed)
[facility] OFFICE PROGRESS NOTE  Patient Care Team: [name], MD as PCP - General [name], RN as Oncology Nurse Navigator [name], MD as Consulting Physician (Hematology and Oncology) [name], MD as Attending Physician (Radiation Oncology) [name], RD as Dietitian (Nutrition)  SUMMARY OF ONCOLOGIC HISTORY: Oncology History   Cancer of base of tongue [facility])   Staging form: Lip and Oral Cavity, AJCC 7th Edition     Clinical stage from 10/14/2015: Stage IVA (T2, N2b, M0) - Signed by [name], MD on 10/19/2015       Cancer of base of tongue ([place])   09/29/2015 Pathology Results Accession: [id number] FNA right neck showed necrotic cells suspicious for squamous cell cancer   09/29/2015 Pathology Results Accession: [id number] Mouth biopsy showed pyogenic granuloma   10/08/2015 Imaging CT neck showed 1. 3 cm right tongue base mass consistent with malignancy.2. Necrotic right level II and III nodal metastases.3. Enlarged right thyroid lobe containing multiple nodules.   10/14/2015 Imaging PET scan showed 1. Hypermetabolic right tongue base mass with hypermetabolic right level II and III adenopathy. Small left level III lymph node appears mildly hypermetabolic as well.2. A few scattered tiny pulmonary nodules are too small for PETresolution   10/16/2015 Imaging [place] Head/Neck:  1) Multiple thyroid lesions bilaterally; dominant nodule in right lower pole measures 22 mm and meets fine-needle aspiration criteria. 2) Complex mass within the soft tissues of the right side of neck worrisome for metastatic malignancy.   10/26/2015 Pathology Results Accession [id number]:  Thyroid FNA - Consistent with benign follicular nodule.   10/28/2015 Surgery Multiple tooth extractions (26), right tongue base biopsy.   10/28/2015 Pathology Results Accession [id number]:  Tongue, right base -  Infiltrative SCC, p16 positive.  Actinomyces infection.   11/05/2015 Procedure Port-a-cath and PEG placed.    11/11/2015 -  Radiation Therapy She received radiation   11/11/2015 - 11/12/2015 Chemotherapy She received high dose cisplatin. Treatment was discontinued permanently due to severe side-effects   11/13/2015 Adverse Reaction She presented with intractable nausea and vomiting   11/13/2015 - 11/30/2015 Hospital Admission She was hospitalized for intractable nausea, vomiting and developed acute renal failure, reversed with aggressive IV fluid hydration. Subsequently, she was found to have intra-abdominal infection with MRSA requiring placement of drainage tube and IV ABx   11/24/2015 Imaging CT scan showed large abdominal abscess   11/25/2015 Procedure She had placement of drain   12/08/2015 Procedure Intra-abdominal drain was removed   12/08/2015 Imaging CT abdomen showed resolution of abscess and a new left lower base lung nodules    INTERVAL HISTORY: Please see below for problem oriented charting. She returns for weekly supportive visit. She has completed antibiotic therapy. No further drainage around the feeding tube. She continues to struggle with nausea after feeding. She is gaining weight. REVIEW OF SYSTEMS:   Constitutional: Denies fevers, chills  Eyes: Denies blurriness of vision Ears, nose, mouth, throat, and face: Denies mucositis or sore throat Respiratory: Denies cough, dyspnea or wheezes Cardiovascular: Denies palpitation, chest discomfort or lower extremity swelling Lymphatics: Denies new lymphadenopathy or easy bruising Neurological:Denies numbness, tingling or new weaknesses Behavioral/Psych: Mood is stable, no new changes  All other systems were reviewed with the patient and are negative.  I have reviewed the past medical history, past surgical history, social history and family history with the patient and they are unchanged from previous note.  ALLERGIES:  is allergic to morphine and related.  MEDICATIONS:  Current Outpatient Prescriptions  Medication Sig Dispense Refill  .  lidocaine-prilocaine (EMLA) cream Apply to affected area once 30 g 3  . metoCLOPramide (REGLAN) 10 MG tablet Take 1 tablet (10 mg total) by mouth 4 (four) times daily -  before meals and at bedtime. 90 tablet 4  . Nutritional Supplements (FEEDING SUPPLEMENT, OSMOLITE 1.5 CAL,) LIQD Osmolite 1.5, 1.5 cans 4 times daily with 60 cc free water before and after bolus feeding. Give 45 ml Liquid Promod (or equivalent) once daily with 240 cc water flush after protein supplement. Give additional 240 cc free water flushes 4 times daily. 1422 mL 0  . ondansetron (ZOFRAN) 8 MG tablet Take 1 tablet (8 mg total) by mouth 2 (two) times daily as needed. Start on the third day after chemotherapy. 30 tablet 1  . amLODipine (NORVASC) 5 MG tablet Place 1 tablet (5 mg total) into feeding tube 2 (two) times daily. (Patient not taking: Reported on 12/21/2015) 60 tablet 6  . prochlorperazine (COMPAZINE) 10 MG tablet Take 10 mg by mouth. Reported on 12/24/2015    . sucralfate (CARAFATE) 1 g tablet Dissolve 1 tablet in 10 mL H20 and swallow 30 min prior to meals and bedtime. (Patient not taking: Reported on 12/21/2015) 30 tablet 5   No current facility-administered medications for this visit.   Facility-Administered Medications Ordered in Other Visits  Medication Dose Route Frequency Provider Last Rate Last Dose  . hyaluronate sodium (RADIAPLEXRX) gel   Topical Once Holley Bouche, NP        PHYSICAL EXAMINATION: ECOG PERFORMANCE STATUS: 1 - Symptomatic but completely ambulatory  Filed Vitals:   12/24/15 0914  BP: 139/62  Pulse: 78  Temp: 98 F (36.7 C)  Resp: 18   Filed Weights   12/24/15 0914  Weight: 150 lb 12.8 oz (68.402 kg)    GENERAL:alert, no distress and comfortable SKIN:She has significant skin redness from recent radiation. No open sores EYES: normal, Conjunctiva are pink and non-injected, sclera clear OROPHARYNX:no exudate, no erythema and lips, buccal mucosa, and tongue normal . No  mucositis LYMPH:  She has persistent visible lymphadenopathy on the rest of the neck  ABDOMEN:abdomen soft, non-tender and normal bowel sounds. Feeding tube site looks okay Musculoskeletal:no cyanosis of digits and no clubbing  NEURO: alert & oriented x 3 with fluent speech, no focal motor/sensory deficits  LABORATORY DATA:  I have reviewed the data as listed    Component Value Date/Time   NA 136 12/23/2015 0840   NA 130* 11/30/2015 0706   K 4.3 12/23/2015 0840   K 3.9 11/30/2015 0706   CL 98* 11/30/2015 0706   CO2 30* 12/23/2015 0840   CO2 27 11/30/2015 0706   GLUCOSE 144* 12/23/2015 0840   GLUCOSE 110* 11/30/2015 0706   BUN 36.0* 12/23/2015 0840   BUN 19 11/30/2015 0706   CREATININE 1.0 12/23/2015 0840   CREATININE 0.64 11/30/2015 0706   CREATININE 1.04 04/07/2015 1536   CALCIUM 9.5 12/23/2015 0840   CALCIUM 8.3* 11/30/2015 0706   PROT 6.5 12/23/2015 0840   PROT 6.0* 11/30/2015 0706   ALBUMIN 3.3* 12/23/2015 0840   ALBUMIN 2.3* 11/30/2015 0706   AST 15 12/23/2015 0840   AST 25 11/30/2015 0706   ALT 12 12/23/2015 0840   ALT 27 11/30/2015 0706   ALKPHOS 88 12/23/2015 0840   ALKPHOS 87 11/30/2015 0706   BILITOT 0.39 12/23/2015 0840   BILITOT 0.6 11/30/2015 0706   GFRNONAA >60 11/30/2015 0706   GFRAA >60 11/30/2015 8676  No results found for: SPEP, UPEP  Lab Results  Component Value Date   WBC 4.1 12/23/2015   NEUTROABS 2.8 12/23/2015   HGB 8.2* 12/23/2015   HCT 25.9* 12/23/2015   MCV 65.5* 12/23/2015   PLT 156 12/23/2015      Chemistry      Component Value Date/Time   NA 136 12/23/2015 0840   NA 130* 11/30/2015 0706   K 4.3 12/23/2015 0840   K 3.9 11/30/2015 0706   CL 98* 11/30/2015 0706   CO2 30* 12/23/2015 0840   CO2 27 11/30/2015 0706   BUN 36.0* 12/23/2015 0840   BUN 19 11/30/2015 0706   CREATININE 1.0 12/23/2015 0840   CREATININE 0.64 11/30/2015 0706   CREATININE 1.04 04/07/2015 1536      Component Value Date/Time   CALCIUM 9.5 12/23/2015  0840   CALCIUM 8.3* 11/30/2015 0706   ALKPHOS 88 12/23/2015 0840   ALKPHOS 87 11/30/2015 0706   AST 15 12/23/2015 0840   AST 25 11/30/2015 0706   ALT 12 12/23/2015 0840   ALT 27 11/30/2015 0706   BILITOT 0.39 12/23/2015 0840   BILITOT 0.6 11/30/2015 0706     ASSESSMENT & PLAN:  Cancer of base of tongue ([place]) She is doing well, apart from nausea from feeding Continue aggressive supportive care. I will see her in 2 weeks for further assessment    Anemia due to other cause This is likely anemia of chronic disease. The patient denies recent history of bleeding such as epistaxis, hematuria or hematochezia. She is asymptomatic from the anemia. We will observe for now.  She does not require transfusion now.    Severe protein-calorie malnutrition ([place]) She is struggling with nutritional intake due to early satiety and difficulties tolerating 6 cans of nutritional supplement per day. I recommend close follow-up with nutritionist. Her recent blood work looks good and she is gaining weight. Continue to same. She will continue premedication with reglan before each feeding   No orders of the defined types were placed in this encounter.   All questions were answered. The patient knows to call the clinic with any problems, questions or concerns. No barriers to learning was detected. I spent 15 minutes counseling the patient face to face. The total time spent in the appointment was 20 minutes and more than 50% was on counseling and review of test results     [facility], [place], MD 12/24/2015 9:35 AM

## 2015-12-24 NOTE — Progress Notes (Signed)
I briefly saw Ms. Lye at the request of Harlow Asa, RN re: patient's complaints of immediate skin burning with the Sonafine cream.  She endorses itching as well.  She has tried Neosporin, which has not offered her any relief. She has 6 treatments remaining.   On exam, she appears to only have dry desquamation and hyperpigmentation; there is no moist desquamation present.  With the help of Patric Dykes, RN, we tried Radiaplex cream on a small patch of skin on her neck while she was in the clinic.  She did not experience any burning with the Radiaplex cream.  I signed the verbal order for the Radiaplex cream and encouraged Ms. Zaffino to let us know tomorrow if she had continued burning and we could reassess her after treatment.     Mike Craze, NP [place] [phone]

## 2015-12-24 NOTE — Telephone Encounter (Signed)
per pof to sch pt appt-gave pt copy ogf avs

## 2015-12-24 NOTE — Assessment & Plan Note (Signed)
She is doing well, apart from nausea from feeding Continue aggressive supportive care. I will see her in 2 weeks for further assessment

## 2015-12-25 ENCOUNTER — Ambulatory Visit
Admission: RE | Admit: 2015-12-25 | Discharge: 2015-12-25 | Disposition: A | Discharge status: Home / Self Care | Payer: 59 | Source: Ambulatory Visit | Attending: Radiation Oncology | Admitting: Radiation Oncology

## 2015-12-25 ENCOUNTER — Encounter: Payer: Self-pay | Admitting: Nutrition

## 2015-12-25 ENCOUNTER — Ambulatory Visit: Payer: 59

## 2015-12-25 DIAGNOSIS — Z809 Family history of malignant neoplasm, unspecified: Secondary | ICD-10-CM | POA: Diagnosis not present

## 2015-12-25 DIAGNOSIS — Z510 Encounter for antineoplastic radiation therapy: Secondary | ICD-10-CM | POA: Diagnosis not present

## 2015-12-25 DIAGNOSIS — C01 Malignant neoplasm of base of tongue: Secondary | ICD-10-CM | POA: Diagnosis not present

## 2015-12-25 DIAGNOSIS — I10 Essential (primary) hypertension: Secondary | ICD-10-CM | POA: Diagnosis not present

## 2015-12-25 DIAGNOSIS — E785 Hyperlipidemia, unspecified: Secondary | ICD-10-CM | POA: Diagnosis not present

## 2015-12-25 DIAGNOSIS — L309 Dermatitis, unspecified: Secondary | ICD-10-CM | POA: Diagnosis not present

## 2015-12-25 DIAGNOSIS — D563 Thalassemia minor: Secondary | ICD-10-CM | POA: Diagnosis not present

## 2015-12-25 NOTE — Progress Notes (Signed)
  Oncology Nurse Navigator Documentation  Navigator Location: [facility] Onc (12/24/15 0845) Navigator Encounter Type: Clinic/MDC (12/24/15 0845)           Patient Visit Type: MedOnc (12/24/15 0845)    To provide support and encouragement, care continuity and to assess for needs, met with Ms. Debra Farrell following [place] treatment, joined her for Established Patient appt with Dr. Alvy Bimler.  She is accompanied by her dtr.  She reports:  Instilling 2 cans of supplement 3 times daily.  She comments that she has a sense of fullness with the third feeding and it causes a mild sense of nausea.  Nonetheless, she prefers this regime over a suggested practice of smaller quantities more frequently.  No pain with swallowing or otherwise.  Conducting ADLs, physically active.  Absence of taste. She states she tries different foods but nothing is palatable.  I encouraged her to experiment with different foods.  She understands that increased oral intake of nutrition and hydration is important for maintenance of swallowing reflex and the removal of her PEG.    She presents with a 3 lb weight gain today.  She denied any needs or concerns; I encouraged her to contact me if that changes before I see her next, she verbalized understanding.  Gayleen Orem, RN, BSN, [place] at [place] [phone]                             Time Spent with Patient: 81 (12/24/15 0845)

## 2015-12-28 ENCOUNTER — Encounter: Payer: Self-pay | Admitting: Hematology and Oncology

## 2015-12-28 ENCOUNTER — Other Ambulatory Visit: Payer: Self-pay | Admitting: Hematology and Oncology

## 2015-12-28 ENCOUNTER — Telehealth: Payer: Self-pay | Admitting: Hematology and Oncology

## 2015-12-28 ENCOUNTER — Ambulatory Visit
Admission: RE | Admit: 2015-12-28 | Discharge: 2015-12-28 | Disposition: A | Discharge status: Home / Self Care | Payer: 59 | Source: Ambulatory Visit | Attending: Radiation Oncology | Admitting: Radiation Oncology

## 2015-12-28 ENCOUNTER — Ambulatory Visit: Payer: 59

## 2015-12-28 ENCOUNTER — Telehealth: Payer: Self-pay | Admitting: *Deleted

## 2015-12-28 VITALS — BP 116/54 | HR 101 | Temp 98.6°F | Wt 148.5 lb

## 2015-12-28 DIAGNOSIS — C01 Malignant neoplasm of base of tongue: Secondary | ICD-10-CM | POA: Diagnosis not present

## 2015-12-28 DIAGNOSIS — Z809 Family history of malignant neoplasm, unspecified: Secondary | ICD-10-CM | POA: Diagnosis not present

## 2015-12-28 DIAGNOSIS — D563 Thalassemia minor: Secondary | ICD-10-CM | POA: Diagnosis not present

## 2015-12-28 DIAGNOSIS — E785 Hyperlipidemia, unspecified: Secondary | ICD-10-CM | POA: Diagnosis not present

## 2015-12-28 DIAGNOSIS — L309 Dermatitis, unspecified: Secondary | ICD-10-CM | POA: Diagnosis not present

## 2015-12-28 DIAGNOSIS — Z510 Encounter for antineoplastic radiation therapy: Secondary | ICD-10-CM | POA: Diagnosis not present

## 2015-12-28 DIAGNOSIS — I10 Essential (primary) hypertension: Secondary | ICD-10-CM | POA: Diagnosis not present

## 2015-12-28 NOTE — Progress Notes (Signed)
Weekly Management Note:  Outpatient    ICD-9-CM ICD-10-CM   1. 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  CBCT/MVCT images/Port film x-rays were reviewed.  The chart was checked.   Debra Farrell is here for her 31st fraction of radiation to her BOT and Bilateral Neck. She reports pain a 9/10 over her neck and her throat. She is taking 1 percocet (5/325) about 4 times daily without relief.   She has difficulty crushing them at home. She is not taking anything orally because of pain when swallowing. She also plans to start the carafate today. She is instilling 5 osmolite cans daily throught her PEG tube. She has difficulty with nausea towards the end of the day and is unable to take her 6th osmolite can at night. She instills 120cc's water 5 times a day, as she again has difficulty with feeding. Her mouth is dry, she has difficulty opening her mouth because of pain. She is using biotene rinses to keep her mouth moist. The skin to her neck is dry, hyperpigmented and peeling. She is using neosporin to her neck. She is not using radiaplex cream or sonafine because of severe burning when she applies it. She finishes radiation on Friday, and she does have an appointment to see Dr. Alvy Bimler that day.    Orthostatics: BP sitting 124/66 pulse 93. BP standing 116/54 pulse 101.   Physical Findings:  Wt Readings from Last 3 Encounters:  12/28/15 148 lb 8 oz (67.359 kg)  12/24/15 150 lb 12.8 oz (68.402 kg)  12/22/15 147 lb 9.6 oz (66.951 kg)    weight is 148 lb 8 oz (67.359 kg). Her temperature is 98.6 F (37 C). Her blood pressure is 116/54 and her pulse is 101. Her oxygen saturation is 100%.   tympanic membranes clear skin is dry and peeling with  hyperpigmentation over neck.. + right neck mass;  confluent oropharyngeal mucositis; no thrush   CBC    Component Value Date/Time    WBC 4.1 12/23/2015 0839   WBC 9.6 11/27/2015 0600   RBC 3.96 12/23/2015 0839   RBC 4.27 11/27/2015 0600   RBC 3.97 11/15/2015 0700   HGB 8.2* 12/23/2015 0839   HGB 9.5* 11/27/2015 0600   HCT 25.9* 12/23/2015 0839   HCT 28.8* 11/27/2015 0600   PLT 156 12/23/2015 0839   PLT 314 11/27/2015 0600   MCV 65.5* 12/23/2015 0839   MCV 67.4* 11/27/2015 0600   MCH 20.8* 12/23/2015 0839   MCH 22.2* 11/27/2015 0600   MCHC 31.8 12/23/2015 0839   MCHC 33.0 11/27/2015 0600   RDW 23.9* 12/23/2015 0839   RDW 22.4* 11/27/2015 0600   LYMPHSABS 0.3* 12/23/2015 0839   LYMPHSABS 1.1 11/27/2015 0600   MONOABS 0.7 12/23/2015 0839   MONOABS 0.6 11/27/2015 0600   EOSABS 0.2 12/23/2015 0839   EOSABS 0.0 11/27/2015 0600   BASOSABS 0.0 12/23/2015 0839   BASOSABS 0.0 11/27/2015 0600     CMP     Component Value Date/Time   NA 136 12/23/2015 0840   NA 130* 11/30/2015 0706   K 4.3 12/23/2015 0840   K 3.9 11/30/2015 0706   CL 98* 11/30/2015 0706   CO2 30* 12/23/2015 0840   CO2 27 11/30/2015 0706   GLUCOSE 144* 12/23/2015 0840   GLUCOSE 110* 11/30/2015 0706   BUN  36.0* 12/23/2015 0840   BUN 19 11/30/2015 0706   CREATININE 1.0 12/23/2015 0840   CREATININE 0.64 11/30/2015 0706   CREATININE 1.04 04/07/2015 1536   CALCIUM 9.5 12/23/2015 0840   CALCIUM 8.3* 11/30/2015 0706   PROT 6.5 12/23/2015 0840   PROT 6.0* 11/30/2015 0706   ALBUMIN 3.3* 12/23/2015 0840   ALBUMIN 2.3* 11/30/2015 0706   AST 15 12/23/2015 0840   AST 25 11/30/2015 0706   ALT 12 12/23/2015 0840   ALT 27 11/30/2015 0706   ALKPHOS 88 12/23/2015 0840   ALKPHOS 87 11/30/2015 0706   BILITOT 0.39 12/23/2015 0840   BILITOT 0.6 11/30/2015 0706   GFRNONAA >60 11/30/2015 0706   GFRAA >60 11/30/2015 0706     Impression:  The patient is tolerating radiotherapy.   Plan:  Continue radiotherapy as planned.  F/u in 2 wks. Advised to take 2 percocet every 4 hours which will double up her dosing, at least. I'll ask Dr Alvy Bimler to see her  earlier for? Liquid pain meds +/- long acting pain meds. Pain is not well controlled.      -----------------------------------  Debra Gibson, MD

## 2015-12-28 NOTE — Telephone Encounter (Signed)
Called pt to check on her pain per Dr. Alvy Bimler.  Pt says she has rx for Percocet 5-325 mg and she was instructed by Rad Onc to take 2 tablets every 4 hrs for pain.  She just took two tablets now so she is not sure yet how it is working.  Pt says she does not have any other pain meds at home, just the percocet.   Informed pt Dr. Alvy Bimler can give her Rx for stronger pain meds today if she has someone who can come by to pick up the Rx at our office.   Pt says she will take the two percocet today and see Dr. Alvy Bimler tomorrow morning as scheduled.  She declined offer to get another Rx today for stronger pain meds.

## 2015-12-28 NOTE — Progress Notes (Signed)
fmla forms for patient and wendi that were faxed 10/28/15 I sent to medical records

## 2015-12-28 NOTE — Progress Notes (Signed)
I sent fax from 11/23/15 to medical records--wendi/ fmla

## 2015-12-28 NOTE — Progress Notes (Signed)
Debra Farrell is here for her 31st fraction of radiation to her BOT and Bilateral Neck. She reports pain a 9/10 over her neck and her throat. She is taking 1 percocet (5/325) about 4 times daily without relief. She states she may start taking 2 pills today. She has difficulty crushing them at home. She is not taking anything orally because of pain when swallowing. She also plans to start the carafate today. She is instilling 5 osmolite cans daily throught her PEG tube. She has difficulty with nausea towards the end of the day and is unable to take her 6th osmolite can at night. She instills 120cc's water 5 times a day, as she again has difficulty with feeding. Her mouth is dry, she has difficulty opening her mouth because of pain. She is using biotene rinses to keep her mouth moist. The skin to her neck is dry, hyperpigmented and peeling. She is using neosporin to her neck. She is not using radiaplex cream or sonafine because of severe burning when she applies it. She finishes radiation on Friday, and she does have an appointment to see Dr. Alvy Bimler that day.   BP 116/54 mmHg  Pulse 101  Temp(Src) 98.6 F (37 C)  Wt 148 lb 8 oz (67.359 kg)  SpO2 100%   Orthostatics: BP sitting 124/66 pulse 93. BP standing 116/54 pulse 101.  Wt Readings from Last 3 Encounters:  12/28/15 148 lb 8 oz (67.359 kg)  12/24/15 150 lb 12.8 oz (68.402 kg)  12/22/15 147 lb 9.6 oz (66.951 kg)

## 2015-12-28 NOTE — Telephone Encounter (Signed)
lvm for pt regarding to April 4 appt....pt ok and aware

## 2015-12-29 ENCOUNTER — Ambulatory Visit (HOSPITAL_BASED_OUTPATIENT_CLINIC_OR_DEPARTMENT_OTHER): Payer: 59 | Admitting: Hematology and Oncology

## 2015-12-29 ENCOUNTER — Ambulatory Visit
Admission: RE | Admit: 2015-12-29 | Discharge: 2015-12-29 | Disposition: A | Discharge status: Home / Self Care | Payer: 59 | Source: Ambulatory Visit | Attending: Radiation Oncology | Admitting: Radiation Oncology

## 2015-12-29 ENCOUNTER — Other Ambulatory Visit: Payer: Self-pay | Admitting: *Deleted

## 2015-12-29 ENCOUNTER — Ambulatory Visit (HOSPITAL_COMMUNITY)
Admission: RE | Admit: 2015-12-29 | Discharge: 2015-12-29 | Disposition: A | Discharge status: Home / Self Care | Payer: 59 | Source: Ambulatory Visit | Attending: Hematology and Oncology | Admitting: Hematology and Oncology

## 2015-12-29 ENCOUNTER — Ambulatory Visit (HOSPITAL_BASED_OUTPATIENT_CLINIC_OR_DEPARTMENT_OTHER): Payer: 59

## 2015-12-29 ENCOUNTER — Ambulatory Visit: Payer: 59 | Admitting: Nutrition

## 2015-12-29 ENCOUNTER — Ambulatory Visit: Payer: 59

## 2015-12-29 ENCOUNTER — Encounter: Payer: Self-pay | Admitting: Hematology and Oncology

## 2015-12-29 ENCOUNTER — Telehealth: Payer: Self-pay | Admitting: Hematology and Oncology

## 2015-12-29 ENCOUNTER — Telehealth: Payer: Self-pay | Admitting: *Deleted

## 2015-12-29 VITALS — BP 149/64 | HR 104 | Temp 98.7°F | Resp 19 | Wt 149.4 lb

## 2015-12-29 DIAGNOSIS — R112 Nausea with vomiting, unspecified: Secondary | ICD-10-CM

## 2015-12-29 DIAGNOSIS — R070 Pain in throat: Secondary | ICD-10-CM

## 2015-12-29 DIAGNOSIS — C01 Malignant neoplasm of base of tongue: Secondary | ICD-10-CM

## 2015-12-29 DIAGNOSIS — E43 Unspecified severe protein-calorie malnutrition: Secondary | ICD-10-CM

## 2015-12-29 DIAGNOSIS — K5903 Drug induced constipation: Secondary | ICD-10-CM | POA: Diagnosis not present

## 2015-12-29 DIAGNOSIS — I10 Essential (primary) hypertension: Secondary | ICD-10-CM | POA: Diagnosis not present

## 2015-12-29 DIAGNOSIS — Z809 Family history of malignant neoplasm, unspecified: Secondary | ICD-10-CM | POA: Diagnosis not present

## 2015-12-29 DIAGNOSIS — T402X5A Adverse effect of other opioids, initial encounter: Secondary | ICD-10-CM

## 2015-12-29 DIAGNOSIS — L589 Radiodermatitis, unspecified: Secondary | ICD-10-CM

## 2015-12-29 DIAGNOSIS — D563 Thalassemia minor: Secondary | ICD-10-CM | POA: Diagnosis not present

## 2015-12-29 DIAGNOSIS — R1110 Vomiting, unspecified: Secondary | ICD-10-CM

## 2015-12-29 DIAGNOSIS — Z510 Encounter for antineoplastic radiation therapy: Secondary | ICD-10-CM | POA: Diagnosis not present

## 2015-12-29 DIAGNOSIS — L309 Dermatitis, unspecified: Secondary | ICD-10-CM | POA: Diagnosis not present

## 2015-12-29 DIAGNOSIS — E785 Hyperlipidemia, unspecified: Secondary | ICD-10-CM | POA: Diagnosis not present

## 2015-12-29 HISTORY — DX: Pain in throat: R07.0

## 2015-12-29 MED ORDER — SODIUM CHLORIDE 0.9 % IV SOLN
Freq: Once | INTRAVENOUS | Status: AC
  Administered 2015-12-29: 11:00:00 via INTRAVENOUS

## 2015-12-29 MED ORDER — HYDROMORPHONE HCL 4 MG/ML IJ SOLN
2.0000 mg | INTRAMUSCULAR | Status: DC | PRN
  Administered 2015-12-29: 2 mg via INTRAVENOUS

## 2015-12-29 MED ORDER — HYDROMORPHONE HCL 4 MG/ML IJ SOLN
INTRAMUSCULAR | Status: AC
  Filled 2015-12-29: qty 1

## 2015-12-29 MED ORDER — SODIUM CHLORIDE 0.9 % IV SOLN
Freq: Once | INTRAVENOUS | Status: AC
  Administered 2015-12-29: 11:00:00 via INTRAVENOUS
  Filled 2015-12-29: qty 4

## 2015-12-29 MED ORDER — OSMOLITE 1.5 CAL PO LIQD: ORAL | Status: DC

## 2015-12-29 MED ORDER — HYDROMORPHONE HCL 4 MG PO TABS: 4.0000 mg | ORAL_TABLET | ORAL | Status: DC | PRN

## 2015-12-29 MED ORDER — FENTANYL 25 MCG/HR TD PT72: 25.0000 ug | MEDICATED_PATCH | TRANSDERMAL | Status: DC

## 2015-12-29 MED FILL — ONDANSETRON HCL 8 MG TABLET: 8 | 15 days supply | Qty: 30 | Fill #0

## 2015-12-29 MED FILL — fentaNYL 25 MCG/HR PT72: 25 | 15 days supply | Qty: 5 | Fill #0

## 2015-12-29 MED FILL — HYDROmorphone HCL 4 MG TABS: 4 | 10 days supply | Qty: 60 | Fill #0

## 2015-12-29 NOTE — Telephone Encounter (Signed)
Per staff message and POF I have scheduled appts. Advised scheduler of appts. JMW  

## 2015-12-29 NOTE — Telephone Encounter (Signed)
Gave and pritned appt sched and avs fo rpt fo rApril...emailed MW to add tx.

## 2015-12-29 NOTE — Assessment & Plan Note (Signed)
She has severe intractable nausea and vomiting likely multifactorial from mucous secretion, constipation and delayed gastric clearance from nutritional supplements. I recommend IV fluid hydration therapy and IV antiemetics on a regular basis. Again, I reinforced the importance of laxative therapy to control constipation.

## 2015-12-29 NOTE — Assessment & Plan Note (Signed)
She has severe, uncontrolled throat pain for several days. We discussed pain management. She is quite reluctant to try strong pain medicine. I recommend a trial of fentanyl patch and Dilaudid as needed for breakthrough pain. I will reassess next week.

## 2015-12-29 NOTE — Progress Notes (Signed)
[place] OFFICE PROGRESS NOTE  Patient Care Team: [name], MD as PCP - General [name], RN as Oncology Nurse Navigator [name], MD as Consulting Physician (Hematology and Oncology) [name], MD as Attending Physician (Radiation Oncology) [name], RD as Dietitian (Nutrition)  SUMMARY OF ONCOLOGIC HISTORY: Oncology History   Cancer of base of tongue [facility])   Staging form: Lip and Oral Cavity, AJCC 7th Edition     Clinical stage from 10/14/2015: Stage IVA (T2, N2b, M0) - Signed by [name], MD on 10/19/2015       Cancer of base of tongue ([place])   09/29/2015 Pathology Results Accession: [id number] FNA right neck showed necrotic cells suspicious for squamous cell cancer   09/29/2015 Pathology Results Accession: [id number] Mouth biopsy showed pyogenic granuloma   10/08/2015 Imaging CT neck showed 1. 3 cm right tongue base mass consistent with malignancy.2. Necrotic right level II and III nodal metastases.3. Enlarged right thyroid lobe containing multiple nodules.   10/14/2015 Imaging PET scan showed 1. Hypermetabolic right tongue base mass with hypermetabolic right level II and III adenopathy. Small left level III lymph node appears mildly hypermetabolic as well.2. A few scattered tiny pulmonary nodules are too small for PETresolution   10/16/2015 Imaging [place] Head/Neck:  1) Multiple thyroid lesions bilaterally; dominant nodule in right lower pole measures 22 mm and meets fine-needle aspiration criteria. 2) Complex mass within the soft tissues of the right side of neck worrisome for metastatic malignancy.   10/26/2015 Pathology Results Accession [id number]:  Thyroid FNA - Consistent with benign follicular nodule.   10/28/2015 Surgery Multiple tooth extractions (26), right tongue base biopsy.   10/28/2015 Pathology Results Accession [id number]:  Tongue, right base -  Infiltrative SCC, p16 positive.  Actinomyces infection.   11/05/2015 Procedure Port-a-cath and PEG placed.    11/11/2015 -  Radiation Therapy She received radiation   11/11/2015 - 11/12/2015 Chemotherapy She received high dose cisplatin. Treatment was discontinued permanently due to severe side-effects   11/13/2015 Adverse Reaction She presented with intractable nausea and vomiting   11/13/2015 - 11/30/2015 Hospital Admission She was hospitalized for intractable nausea, vomiting and developed acute renal failure, reversed with aggressive IV fluid hydration. Subsequently, she was found to have intra-abdominal infection with MRSA requiring placement of drainage tube and IV ABx   11/24/2015 Imaging CT scan showed large abdominal abscess   11/25/2015 Procedure She had placement of drain   12/08/2015 Procedure Intra-abdominal drain was removed   12/08/2015 Imaging CT abdomen showed resolution of abscess and a new left lower base lung nodules    INTERVAL HISTORY: Please see below for problem oriented charting. She is seen as part of her weekly symptomatic management. Since the last time I saw her, she is miserable with 9 out of 10 throat pain, difficulties with swallowing, excessive mucus production, and control nausea and vomiting, mucositis pain, and severe constipation for 4 days. She has been taking Percocet without relief. She has started to take laxative. She has difficulties tolerating her tube feeds. She denies recent cough, fever or chills.  REVIEW OF SYSTEMS:   Constitutional: Denies fevers, chills or abnormal weight loss Eyes: Denies blurriness of vision Respiratory: Denies cough, dyspnea or wheezes Cardiovascular: Denies palpitation, chest discomfort or lower extremity swelling Lymphatics: Denies new lymphadenopathy or easy bruising Neurological:Denies numbness, tingling or new weaknesses Behavioral/Psych: Mood is stable, no new changes  All other systems were reviewed with the patient and are negative.  I have reviewed the  past medical history, past surgical history, social history and family  history with the patient and they are unchanged from previous note.  ALLERGIES:  is allergic to morphine and related.  MEDICATIONS:  Current Outpatient Prescriptions  Medication Sig Dispense Refill  . lidocaine-prilocaine (EMLA) cream Apply to affected area once 30 g 3  . metoCLOPramide (REGLAN) 10 MG tablet Take 1 tablet (10 mg total) by mouth 4 (four) times daily -  before meals and at bedtime. 90 tablet 4  . ondansetron (ZOFRAN) 8 MG tablet Take 1 tablet (8 mg total) by mouth 2 (two) times daily as needed. Start on the third day after chemotherapy. 30 tablet 1  . oxyCODONE-acetaminophen (PERCOCET/ROXICET) 5-325 MG tablet Take 2 tablets by mouth every 4 (four) hours as needed for severe pain.    Marland Kitchen prochlorperazine (COMPAZINE) 10 MG tablet Take 10 mg by mouth. Reported on 12/29/2015    . fentaNYL (DURAGESIC - DOSED MCG/HR) 25 MCG/HR patch Place 1 patch (25 mcg total) onto the skin every 3 (three) days. 5 patch 0  . HYDROmorphone (DILAUDID) 4 MG tablet Take 1 tablet (4 mg total) by mouth every 4 (four) hours as needed for severe pain. 60 tablet 0  . Nutritional Supplements (FEEDING SUPPLEMENT, OSMOLITE 1.5 CAL,) LIQD Osmolite 1.5 at 60 mL for 12 hours from 7p-7a. 1 can Osmolite 1.5 TID at 9,12,3 with 60 mL free water before and after bolus and continuous feedings. 65mPromod with 240 cc free water once daily. Additional 3646mfree water flush daily 1422 mL 0  . sucralfate (CARAFATE) 1 g tablet Dissolve 1 tablet in 10 mL H20 and swallow 30 min prior to meals and bedtime. (Patient not taking: Reported on 12/29/2015) 30 tablet 5   No current facility-administered medications for this visit.    PHYSICAL EXAMINATION: ECOG PERFORMANCE STATUS: 1 - Symptomatic but completely ambulatory  Filed Vitals:   12/29/15 0935  BP: 149/64  Pulse: 104  Temp: 98.7 F (37.1 C)  Resp: 19   Filed Weights   12/29/15 0935  Weight: 149 lb 6.4 oz (67.767 kg)    GENERAL:alert, no distress and comfortable. She  looks miserable SKIN: Significant radiation-induced skin injury around her neck. No bleeding. EYES: normal, Conjunctiva are pink and non-injected, sclera clear OROPHARYNX:no exudate, no erythema and lips, buccal mucosa, and tongue normal . Noted mucositis. No thrush. NECK: supple, thyroid normal size, non-tender, without nodularity LYMPH:  She has persistent lymphadenopathy in the right side of the neck. LUNGS: clear to auscultation and percussion with normal breathing effort HEART: regular rate & rhythm and no murmurs and no lower extremity edema ABDOMEN:abdomen soft, non-tender and normal bowel sounds. Feeding tube site looks okay without any leak Musculoskeletal:no cyanosis of digits and no clubbing  NEURO: alert & oriented x 3 with fluent speech, no focal motor/sensory deficits  LABORATORY DATA:  I have reviewed the data as listed    Component Value Date/Time   NA 136 12/23/2015 0840   NA 130* 11/30/2015 0706   K 4.3 12/23/2015 0840   K 3.9 11/30/2015 0706   CL 98* 11/30/2015 0706   CO2 30* 12/23/2015 0840   CO2 27 11/30/2015 0706   GLUCOSE 144* 12/23/2015 0840   GLUCOSE 110* 11/30/2015 0706   BUN 36.0* 12/23/2015 0840   BUN 19 11/30/2015 0706   CREATININE 1.0 12/23/2015 0840   CREATININE 0.64 11/30/2015 0706   CREATININE 1.04 04/07/2015 1536   CALCIUM 9.5 12/23/2015 0840   CALCIUM 8.3* 11/30/2015 0706   PROT  6.5 12/23/2015 0840   PROT 6.0* 11/30/2015 0706   ALBUMIN 3.3* 12/23/2015 0840   ALBUMIN 2.3* 11/30/2015 0706   AST 15 12/23/2015 0840   AST 25 11/30/2015 0706   ALT 12 12/23/2015 0840   ALT 27 11/30/2015 0706   ALKPHOS 88 12/23/2015 0840   ALKPHOS 87 11/30/2015 0706   BILITOT 0.39 12/23/2015 0840   BILITOT 0.6 11/30/2015 0706   GFRNONAA >60 11/30/2015 0706   GFRAA >60 11/30/2015 0706    No results found for: SPEP, UPEP  Lab Results  Component Value Date   WBC 4.1 12/23/2015   NEUTROABS 2.8 12/23/2015   HGB 8.2* 12/23/2015   HCT 25.9* 12/23/2015   MCV  65.5* 12/23/2015   PLT 156 12/23/2015      Chemistry      Component Value Date/Time   NA 136 12/23/2015 0840   NA 130* 11/30/2015 0706   K 4.3 12/23/2015 0840   K 3.9 11/30/2015 0706   CL 98* 11/30/2015 0706   CO2 30* 12/23/2015 0840   CO2 27 11/30/2015 0706   BUN 36.0* 12/23/2015 0840   BUN 19 11/30/2015 0706   CREATININE 1.0 12/23/2015 0840   CREATININE 0.64 11/30/2015 0706   CREATININE 1.04 04/07/2015 1536      Component Value Date/Time   CALCIUM 9.5 12/23/2015 0840   CALCIUM 8.3* 11/30/2015 0706   ALKPHOS 88 12/23/2015 0840   ALKPHOS 87 11/30/2015 0706   AST 15 12/23/2015 0840   AST 25 11/30/2015 0706   ALT 12 12/23/2015 0840   ALT 27 11/30/2015 0706   BILITOT 0.39 12/23/2015 0840   BILITOT 0.6 11/30/2015 0706      ASSESSMENT & PLAN:  Cancer of base of tongue ([place]) The patient has experienced significant decline since the last time I saw her. I will start her on IV fluid support daily and will continue to see her on a weekly basis for supportive care management  Throat pain in adult She has severe, uncontrolled throat pain for several days. We discussed pain management. She is quite reluctant to try strong pain medicine. I recommend a trial of fentanyl patch and Dilaudid as needed for breakthrough pain. I will reassess next week.  Constipation due to opioid therapy She has severe, uncontrolled constipation  for 4 days. I reinforced the importance of taking Miralax on a regular basis. I recommend also suppository treatment for her.  Intractable nausea and vomiting She has severe intractable nausea and vomiting likely multifactorial from mucous secretion, constipation and delayed gastric clearance from nutritional supplements. I recommend IV fluid hydration therapy and IV antiemetics on a regular basis. Again, I reinforced the importance of laxative therapy to control constipation.  Severe protein-calorie malnutrition ([place]) She is not able to tolerate her  nutritional supplement due to uncontrolled nausea and vomiting. She has seen dietitian today who will be prescribing nutritional supplement through feeding pump. We will continue to monitor her weight on a regular basis.  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 She is using topical emollient cream. There could be another area/source of dehydration. Continue conservative topical management. As above, I will prescribe IV fluid hydration therapy for her.   No orders of the defined types were placed in this encounter.   All questions were answered. The patient knows to call the clinic with any problems, questions or concerns. No barriers to learning was detected. I spent 25 minutes counseling the patient face to face. The total time spent in the  appointment was 40 minutes and more than 50% was on counseling and review of test results     [facility], [place], MD 12/29/2015 2:45 PM

## 2015-12-29 NOTE — Assessment & Plan Note (Signed)
She has severe, uncontrolled constipation  for 4 days. I reinforced the importance of taking Miralax on a regular basis. I recommend also suppository treatment for her.

## 2015-12-29 NOTE — Progress Notes (Signed)
Nutrition follow up completed with patient and daughter. Patient reports pain not well controlled. She has nausea every day later in the afternoon, despite taking zofran. Reports she has changed bolus feedings to one can at a time and is only tolerating about 5 cans daily.  She continues 45 mL ProMod daily via feeding tube Weight has improved and was documented as 149.4 pounds today, increased from 147.6 pounds March 28. Patient reports no bowel movement since Friday.  Nutrition diagnosis: Inadequate oral intake and inadequate enteral nutrition infusion continue.  Estimated nutrition needs: 2040-2300 calories, 95-108 grams protein, 2.3 L fluid.  Intervention:  Will change tube feeding regimen to combination of continuous feedings and bolus feedings. Patient will use Osmolite 1.5 at 60 mL an hour for 12 hours daily from 7 PM to 7 AM. Patient will give 1 can bolus feedings at 9 AM, 12 PM in 3 PM Patient will continue free water flushes of 60 cc free water before and after bolus feeding and continuous feeding Patient should flush feeding tube with an additional 600 cc free water daily to meet minimum free water needs Tube feeding orders written and advanced homecare notified. Teach back method was used.  Monitoring, evaluation, goals: Patient will tolerate a combination of continuous plus bolus tube feeding to meet estimated nutrition needs to promote healing.  Next visit: Friday, April 7, during infusion.  **Disclaimer: This note was dictated with voice recognition software. Similar sounding words can inadvertently be transcribed and this note may contain transcription errors which may not have been corrected upon publication of note.**

## 2015-12-29 NOTE — Assessment & Plan Note (Signed)
The patient has experienced significant decline since the last time I saw her. I will start her on IV fluid support daily and will continue to see her on a weekly basis for supportive care management

## 2015-12-29 NOTE — Progress Notes (Signed)
Unable to access Port-A-Cath.  Peripheral IV began.  Physician contacted for order for port evaulation in interventional radiology.

## 2015-12-29 NOTE — Assessment & Plan Note (Signed)
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 She is using topical emollient cream. There could be another area/source of dehydration. Continue conservative topical management. As above, I will prescribe IV fluid hydration therapy for her.

## 2015-12-29 NOTE — Assessment & Plan Note (Signed)
She is not able to tolerate her nutritional supplement due to uncontrolled nausea and vomiting. She has seen dietitian today who will be prescribing nutritional supplement through feeding pump. We will continue to monitor her weight on a regular basis.

## 2015-12-29 NOTE — Patient Instructions (Signed)

## 2015-12-30 ENCOUNTER — Ambulatory Visit: Payer: 59

## 2015-12-30 ENCOUNTER — Ambulatory Visit
Admission: RE | Admit: 2015-12-30 | Discharge: 2015-12-30 | Disposition: A | Discharge status: Home / Self Care | Payer: 59 | Source: Ambulatory Visit | Attending: Radiation Oncology | Admitting: Radiation Oncology

## 2015-12-30 ENCOUNTER — Inpatient Hospital Stay (HOSPITAL_COMMUNITY)
Admission: EM | Admit: 2015-12-30 | Discharge: 2016-01-05 | DRG: 872 | Disposition: A | Discharge status: Home / Self Care | Payer: 59 | Attending: Internal Medicine | Admitting: Internal Medicine

## 2015-12-30 ENCOUNTER — Ambulatory Visit (HOSPITAL_BASED_OUTPATIENT_CLINIC_OR_DEPARTMENT_OTHER): Payer: 59

## 2015-12-30 ENCOUNTER — Other Ambulatory Visit: Payer: Self-pay | Admitting: Hematology and Oncology

## 2015-12-30 ENCOUNTER — Ambulatory Visit (HOSPITAL_COMMUNITY)
Admission: RE | Admit: 2015-12-30 | Discharge: 2015-12-30 | Disposition: A | Discharge status: Home / Self Care | Payer: 59 | Source: Ambulatory Visit | Attending: Hematology and Oncology | Admitting: Hematology and Oncology

## 2015-12-30 ENCOUNTER — Emergency Department (HOSPITAL_COMMUNITY): Payer: 59

## 2015-12-30 ENCOUNTER — Other Ambulatory Visit (HOSPITAL_BASED_OUTPATIENT_CLINIC_OR_DEPARTMENT_OTHER): Payer: 59

## 2015-12-30 ENCOUNTER — Encounter (HOSPITAL_COMMUNITY): Payer: Self-pay | Admitting: Emergency Medicine

## 2015-12-30 ENCOUNTER — Other Ambulatory Visit: Payer: Self-pay | Admitting: *Deleted

## 2015-12-30 ENCOUNTER — Encounter: Payer: Self-pay | Admitting: *Deleted

## 2015-12-30 VITALS — BP 145/61 | HR 92 | Temp 99.7°F | Resp 18

## 2015-12-30 DIAGNOSIS — K5903 Drug induced constipation: Secondary | ICD-10-CM | POA: Diagnosis not present

## 2015-12-30 DIAGNOSIS — Z809 Family history of malignant neoplasm, unspecified: Secondary | ICD-10-CM

## 2015-12-30 DIAGNOSIS — K121 Other forms of stomatitis: Secondary | ICD-10-CM | POA: Diagnosis present

## 2015-12-30 DIAGNOSIS — D638 Anemia in other chronic diseases classified elsewhere: Secondary | ICD-10-CM

## 2015-12-30 DIAGNOSIS — Z7189 Other specified counseling: Secondary | ICD-10-CM | POA: Insufficient documentation

## 2015-12-30 DIAGNOSIS — R6510 Systemic inflammatory response syndrome (SIRS) of non-infectious origin without acute organ dysfunction: Secondary | ICD-10-CM | POA: Diagnosis not present

## 2015-12-30 DIAGNOSIS — R911 Solitary pulmonary nodule: Secondary | ICD-10-CM | POA: Diagnosis present

## 2015-12-30 DIAGNOSIS — Y842 Radiological procedure and radiotherapy as the cause of abnormal reaction of the patient, or of later complication, without mention of misadventure at the time of the procedure: Secondary | ICD-10-CM | POA: Diagnosis present

## 2015-12-30 DIAGNOSIS — C01 Malignant neoplasm of base of tongue: Secondary | ICD-10-CM | POA: Insufficient documentation

## 2015-12-30 DIAGNOSIS — D709 Neutropenia, unspecified: Secondary | ICD-10-CM | POA: Diagnosis not present

## 2015-12-30 DIAGNOSIS — L598 Other specified disorders of the skin and subcutaneous tissue related to radiation: Secondary | ICD-10-CM | POA: Diagnosis present

## 2015-12-30 DIAGNOSIS — Z931 Gastrostomy status: Secondary | ICD-10-CM

## 2015-12-30 DIAGNOSIS — K8020 Calculus of gallbladder without cholecystitis without obstruction: Secondary | ICD-10-CM | POA: Diagnosis not present

## 2015-12-30 DIAGNOSIS — T402X5A Adverse effect of other opioids, initial encounter: Secondary | ICD-10-CM | POA: Diagnosis present

## 2015-12-30 DIAGNOSIS — Z6825 Body mass index (BMI) 25.0-25.9, adult: Secondary | ICD-10-CM | POA: Diagnosis not present

## 2015-12-30 DIAGNOSIS — K1231 Oral mucositis (ulcerative) due to antineoplastic therapy: Secondary | ICD-10-CM

## 2015-12-30 DIAGNOSIS — R112 Nausea with vomiting, unspecified: Secondary | ICD-10-CM | POA: Diagnosis not present

## 2015-12-30 DIAGNOSIS — Z79891 Long term (current) use of opiate analgesic: Secondary | ICD-10-CM | POA: Diagnosis not present

## 2015-12-30 DIAGNOSIS — R531 Weakness: Secondary | ICD-10-CM | POA: Diagnosis not present

## 2015-12-30 DIAGNOSIS — E785 Hyperlipidemia, unspecified: Secondary | ICD-10-CM | POA: Diagnosis present

## 2015-12-30 DIAGNOSIS — A4902 Methicillin resistant Staphylococcus aureus infection, unspecified site: Secondary | ICD-10-CM

## 2015-12-30 DIAGNOSIS — Z515 Encounter for palliative care: Secondary | ICD-10-CM | POA: Diagnosis present

## 2015-12-30 DIAGNOSIS — L589 Radiodermatitis, unspecified: Secondary | ICD-10-CM | POA: Diagnosis not present

## 2015-12-30 DIAGNOSIS — R509 Fever, unspecified: Secondary | ICD-10-CM | POA: Diagnosis not present

## 2015-12-30 DIAGNOSIS — Z95828 Presence of other vascular implants and grafts: Secondary | ICD-10-CM

## 2015-12-30 DIAGNOSIS — R5081 Fever presenting with conditions classified elsewhere: Secondary | ICD-10-CM

## 2015-12-30 DIAGNOSIS — Z90710 Acquired absence of both cervix and uterus: Secondary | ICD-10-CM | POA: Diagnosis not present

## 2015-12-30 DIAGNOSIS — E440 Moderate protein-calorie malnutrition: Secondary | ICD-10-CM | POA: Diagnosis present

## 2015-12-30 DIAGNOSIS — I10 Essential (primary) hypertension: Secondary | ICD-10-CM | POA: Diagnosis present

## 2015-12-30 DIAGNOSIS — D563 Thalassemia minor: Secondary | ICD-10-CM | POA: Diagnosis present

## 2015-12-30 DIAGNOSIS — IMO0002 Reserved for concepts with insufficient information to code with codable children: Secondary | ICD-10-CM

## 2015-12-30 DIAGNOSIS — E876 Hypokalemia: Secondary | ICD-10-CM | POA: Diagnosis present

## 2015-12-30 DIAGNOSIS — Z510 Encounter for antineoplastic radiation therapy: Secondary | ICD-10-CM | POA: Diagnosis not present

## 2015-12-30 DIAGNOSIS — D630 Anemia in neoplastic disease: Secondary | ICD-10-CM | POA: Diagnosis present

## 2015-12-30 DIAGNOSIS — T451X5A Adverse effect of antineoplastic and immunosuppressive drugs, initial encounter: Secondary | ICD-10-CM

## 2015-12-30 DIAGNOSIS — L309 Dermatitis, unspecified: Secondary | ICD-10-CM | POA: Diagnosis not present

## 2015-12-30 DIAGNOSIS — D649 Anemia, unspecified: Secondary | ICD-10-CM | POA: Insufficient documentation

## 2015-12-30 DIAGNOSIS — R410 Disorientation, unspecified: Secondary | ICD-10-CM | POA: Diagnosis not present

## 2015-12-30 DIAGNOSIS — K1233 Oral mucositis (ulcerative) due to radiation: Secondary | ICD-10-CM | POA: Diagnosis not present

## 2015-12-30 DIAGNOSIS — R1310 Dysphagia, unspecified: Secondary | ICD-10-CM | POA: Diagnosis not present

## 2015-12-30 DIAGNOSIS — R110 Nausea: Secondary | ICD-10-CM | POA: Diagnosis not present

## 2015-12-30 DIAGNOSIS — E860 Dehydration: Secondary | ICD-10-CM | POA: Diagnosis present

## 2015-12-30 DIAGNOSIS — R070 Pain in throat: Secondary | ICD-10-CM | POA: Diagnosis not present

## 2015-12-30 DIAGNOSIS — Z79899 Other long term (current) drug therapy: Secondary | ICD-10-CM

## 2015-12-30 DIAGNOSIS — E43 Unspecified severe protein-calorie malnutrition: Secondary | ICD-10-CM | POA: Diagnosis not present

## 2015-12-30 DIAGNOSIS — R918 Other nonspecific abnormal finding of lung field: Secondary | ICD-10-CM | POA: Diagnosis not present

## 2015-12-30 DIAGNOSIS — D6489 Other specified anemias: Secondary | ICD-10-CM | POA: Diagnosis present

## 2015-12-30 DIAGNOSIS — Z923 Personal history of irradiation: Secondary | ICD-10-CM

## 2015-12-30 DIAGNOSIS — A419 Sepsis, unspecified organism: Principal | ICD-10-CM | POA: Diagnosis present

## 2015-12-30 DIAGNOSIS — L0291 Cutaneous abscess, unspecified: Secondary | ICD-10-CM | POA: Diagnosis not present

## 2015-12-30 LAB — COMPREHENSIVE METABOLIC PANEL
ALBUMIN: 3.1 g/dL — AB (ref 3.5–5.0)
ALT: 38 U/L (ref 0–55)
ALT: 36 U/L (ref 14–54)
AST: 35 U/L — AB (ref 5–34)
AST: 31 U/L (ref 15–41)
Albumin: 3.1 g/dL — ABNORMAL LOW (ref 3.5–5.0)
Alkaline Phosphatase: 98 U/L (ref 40–150)
Alkaline Phosphatase: 87 U/L (ref 38–126)
Anion Gap: 7 mEq/L (ref 3–11)
Anion gap: 9 (ref 5–15)
BUN: 26 mg/dL (ref 7.0–26.0)
BUN: 26 mg/dL — AB (ref 6–20)
CALCIUM: 9.4 mg/dL (ref 8.4–10.4)
CHLORIDE: 96 meq/L — AB (ref 98–109)
CHLORIDE: 98 mmol/L — AB (ref 101–111)
CO2: 29 mEq/L (ref 22–29)
CO2: 25 mmol/L (ref 22–32)
CREATININE: 0.93 mg/dL (ref 0.44–1.00)
Calcium: 8.7 mg/dL — ABNORMAL LOW (ref 8.9–10.3)
Creatinine: 1 mg/dL (ref 0.6–1.1)
EGFR: 59 mL/min/{1.73_m2} — ABNORMAL LOW (ref >90)
GFR calc Af Amer: >60 mL/min (ref >60)
GFR calc non Af Amer: >60 mL/min (ref >60)
GLUCOSE: 160 mg/dL — AB (ref 70–140)
GLUCOSE: 126 mg/dL — AB (ref 65–99)
POTASSIUM: 4.3 meq/L (ref 3.5–5.1)
POTASSIUM: 3.9 mmol/L (ref 3.5–5.1)
SODIUM: 132 meq/L — AB (ref 136–145)
Sodium: 132 mmol/L — ABNORMAL LOW (ref 135–145)
Total Bilirubin: 0.8 mg/dL (ref 0.20–1.20)
Total Bilirubin: 1.2 mg/dL (ref 0.3–1.2)
Total Protein: 6.7 g/dL (ref 6.4–8.3)
Total Protein: 6.3 g/dL — ABNORMAL LOW (ref 6.5–8.1)

## 2015-12-30 LAB — URINALYSIS, ROUTINE W REFLEX MICROSCOPIC
Bilirubin Urine: NEGATIVE
GLUCOSE, UA: NEGATIVE mg/dL
Ketones, ur: NEGATIVE mg/dL
LEUKOCYTES UA: NEGATIVE
NITRITE: NEGATIVE
PH: 6.5 (ref 5.0–8.0)
Protein, ur: NEGATIVE mg/dL
SPECIFIC GRAVITY, URINE: 1.017 (ref 1.005–1.030)

## 2015-12-30 LAB — CBC WITH DIFFERENTIAL/PLATELET
BASO%: 0.4 % (ref 0.0–2.0)
Basophils Absolute: 0 10*3/uL (ref 0.0–0.1)
Basophils Absolute: 0 10*3/uL (ref 0.0–0.1)
Basophils Relative: 0 %
EOS PCT: 1 %
EOS%: 0.1 % (ref 0.0–7.0)
Eosinophils Absolute: 0 10*3/uL (ref 0.0–0.5)
Eosinophils Absolute: 0.1 10*3/uL (ref 0.0–0.7)
HCT: 23 % — ABNORMAL LOW (ref 34.8–46.6)
HEMATOCRIT: 17.1 % — AB (ref 36.0–46.0)
HEMOGLOBIN: 5.7 g/dL — AB (ref 12.0–15.0)
HGB: 7.3 g/dL — ABNORMAL LOW (ref 11.6–15.9)
LYMPH#: 0.1 10*3/uL — AB (ref 0.9–3.3)
LYMPH%: 1.8 % — ABNORMAL LOW (ref 14.0–49.7)
Lymphocytes Relative: 7 %
Lymphs Abs: 0.7 10*3/uL (ref 0.7–4.0)
MCH: 20.6 pg — ABNORMAL LOW (ref 25.1–34.0)
MCH: 22.8 pg — ABNORMAL LOW (ref 26.0–34.0)
MCHC: 31.7 g/dL (ref 31.5–36.0)
MCHC: 33.3 g/dL (ref 30.0–36.0)
MCV: 65.1 fL — ABNORMAL LOW (ref 79.5–101.0)
MCV: 68.4 fL — AB (ref 78.0–100.0)
MONO#: 0.5 10*3/uL (ref 0.1–0.9)
MONO%: 7.7 % (ref 0.0–14.0)
MONOS PCT: 7 %
Monocytes Absolute: 0.7 10*3/uL (ref 0.1–1.0)
NEUT%: 90 % — AB (ref 38.4–76.8)
NEUTROS ABS: 6.3 10*3/uL (ref 1.5–6.5)
NEUTROS PCT: 85 %
Neutro Abs: 8.2 10*3/uL — ABNORMAL HIGH (ref 1.7–7.7)
PLATELETS: 274 10*3/uL (ref 145–400)
Platelets: 289 10*3/uL (ref 150–400)
RBC: 3.54 10*6/uL — ABNORMAL LOW (ref 3.70–5.45)
RBC: 2.5 MIL/uL — AB (ref 3.87–5.11)
RDW: 23.7 % — AB (ref 11.2–14.5)
RDW: 23.8 % — ABNORMAL HIGH (ref 11.5–15.5)
WBC: 7 10*3/uL (ref 3.9–10.3)
WBC: 9.7 10*3/uL (ref 4.0–10.5)

## 2015-12-30 LAB — PREPARE RBC (CROSSMATCH)

## 2015-12-30 LAB — FIBRINOGEN: Fibrinogen: 489 mg/dL — ABNORMAL HIGH (ref 204–475)

## 2015-12-30 LAB — APTT: aPTT: 36 seconds (ref 24–37)

## 2015-12-30 LAB — URINE MICROSCOPIC-ADD ON

## 2015-12-30 LAB — CBG MONITORING, ED: Glucose-Capillary: 112 mg/dL — ABNORMAL HIGH (ref 65–99)

## 2015-12-30 LAB — RETICULOCYTES
RBC.: 3.08 MIL/uL — AB (ref 3.87–5.11)
Retic Count, Absolute: 70.8 10*3/uL (ref 19.0–186.0)
Retic Ct Pct: 2.3 % (ref 0.4–3.1)

## 2015-12-30 LAB — PROTIME-INR
INR: 1.31 (ref 0.00–1.49)
Prothrombin Time: 16 seconds — ABNORMAL HIGH (ref 11.6–15.2)

## 2015-12-30 LAB — I-STAT CG4 LACTIC ACID, ED: Lactic Acid, Venous: 0.67 mmol/L (ref 0.5–2.0)

## 2015-12-30 LAB — DIRECT ANTIGLOBULIN TEST (NOT AT ARMC)
DAT, IgG: NEGATIVE
DAT, complement: NEGATIVE

## 2015-12-30 LAB — MAGNESIUM: MAGNESIUM: 1.6 mg/dL (ref 1.5–2.5)

## 2015-12-30 LAB — POC OCCULT BLOOD, ED: Fecal Occult Bld: NEGATIVE

## 2015-12-30 MED ORDER — SODIUM CHLORIDE 0.9 % IV BOLUS (SEPSIS)
1000.0000 mL | Freq: Once | INTRAVENOUS | Status: AC
  Administered 2015-12-30: 1000 mL via INTRAVENOUS

## 2015-12-30 MED ORDER — PIPERACILLIN-TAZOBACTAM 3.375 G IVPB 30 MIN
3.3750 g | INTRAVENOUS | Status: AC
  Administered 2015-12-30: 3.375 g via INTRAVENOUS
  Filled 2015-12-30: qty 50

## 2015-12-30 MED ORDER — HEPARIN SOD (PORK) LOCK FLUSH 100 UNIT/ML IV SOLN
500.0000 [IU] | Freq: Once | INTRAVENOUS | Status: AC
  Administered 2015-12-30: 500 [IU] via INTRAVENOUS
  Filled 2015-12-30: qty 5

## 2015-12-30 MED ORDER — SODIUM CHLORIDE 0.9% FLUSH
10.0000 mL | INTRAVENOUS | Status: DC | PRN
  Administered 2015-12-30: 10 mL via INTRAVENOUS
  Filled 2015-12-30: qty 10

## 2015-12-30 MED ORDER — DIPHENHYDRAMINE HCL 12.5 MG/5ML PO ELIX
25.0000 mg | ORAL_SOLUTION | Freq: Once | ORAL | Status: AC
  Administered 2015-12-30: 25 mg
  Filled 2015-12-30: qty 10

## 2015-12-30 MED ORDER — KETOROLAC TROMETHAMINE 30 MG/ML IJ SOLN
15.0000 mg | Freq: Once | INTRAMUSCULAR | Status: AC
  Administered 2015-12-30: 15 mg via INTRAVENOUS
  Filled 2015-12-30: qty 1

## 2015-12-30 MED ORDER — VANCOMYCIN HCL IN DEXTROSE 1-5 GM/200ML-% IV SOLN
1000.0000 mg | INTRAVENOUS | Status: AC
  Administered 2015-12-30: 1000 mg via INTRAVENOUS
  Filled 2015-12-30: qty 200

## 2015-12-30 MED ORDER — FENTANYL CITRATE (PF) 100 MCG/2ML IJ SOLN
50.0000 ug | Freq: Once | INTRAMUSCULAR | Status: AC
  Administered 2015-12-30: 50 ug via INTRAVENOUS
  Filled 2015-12-30: qty 2

## 2015-12-30 MED ORDER — VANCOMYCIN HCL 500 MG IV SOLR
500.0000 mg | Freq: Two times a day (BID) | INTRAVENOUS | Status: DC
  Administered 2015-12-31 – 2016-01-02 (×6): 500 mg via INTRAVENOUS
  Filled 2015-12-30 (×7): qty 500

## 2015-12-30 MED ORDER — PIPERACILLIN-TAZOBACTAM 3.375 G IVPB
3.3750 g | Freq: Three times a day (TID) | INTRAVENOUS | Status: DC
  Administered 2015-12-31 – 2016-01-04 (×13): 3.375 g via INTRAVENOUS
  Filled 2015-12-30 (×15): qty 50

## 2015-12-30 MED ORDER — SODIUM CHLORIDE 0.9 % IV SOLN
Freq: Once | INTRAVENOUS | Status: AC
  Administered 2015-12-30: 21:00:00 via INTRAVENOUS

## 2015-12-30 MED ORDER — HYDROMORPHONE HCL 4 MG/ML IJ SOLN
INTRAMUSCULAR | Status: AC
  Filled 2015-12-30: qty 1

## 2015-12-30 MED ORDER — SODIUM CHLORIDE 0.9 % IV SOLN
Freq: Once | INTRAVENOUS | Status: AC
  Administered 2015-12-30: 11:00:00 via INTRAVENOUS

## 2015-12-30 MED ORDER — HYDROMORPHONE HCL 4 MG/ML IJ SOLN
2.0000 mg | INTRAMUSCULAR | Status: DC | PRN
  Administered 2015-12-30: 2 mg via INTRAVENOUS

## 2015-12-30 MED ORDER — AMOXICILLIN-POT CLAVULANATE 875-125 MG PO TABS: 1.0000 | ORAL_TABLET | Freq: Two times a day (BID) | ORAL | Status: DC

## 2015-12-30 MED ORDER — ACETAMINOPHEN 650 MG RE SUPP
650.0000 mg | Freq: Once | RECTAL | Status: DC
  Filled 2015-12-30: qty 1

## 2015-12-30 MED ORDER — SODIUM CHLORIDE 0.9 % IV SOLN
Freq: Once | INTRAVENOUS | Status: AC
  Administered 2015-12-30: 11:00:00 via INTRAVENOUS
  Filled 2015-12-30: qty 4

## 2015-12-30 MED ORDER — ACETAMINOPHEN 160 MG/5ML PO SOLN
650.0000 mg | Freq: Once | ORAL | Status: AC
Start: 2015-12-30 — End: 2015-12-30
  Administered 2015-12-30: 650 mg
  Filled 2015-12-30: qty 20.3

## 2015-12-30 MED FILL — AMOX-CLAV 875-125 MG TABLET: 875-125 | 7 days supply | Qty: 14 | Fill #0

## 2015-12-30 NOTE — Progress Notes (Signed)
2081- Per Dr. Alvy Bimler, okay to administer blood with temp 100.4.Marland Kitchen

## 2015-12-30 NOTE — Patient Instructions (Signed)

## 2015-12-30 NOTE — Progress Notes (Signed)
Note from yesterday stating pt port was unable to be accessed, pt sent to IR for evaluation. No notes noted in computer at this time.. Called IR and spoke with Prynce Jacober, she states patients port was flipped and they were able to flip it back and is working. Patient was to call IR if it flipped again.  Patient in for lab/flush and fluids today. Assessed port, able to feel all 3 dots and doesn't appear to be flipped over. Accessed port with no difficulties, flushes well with good blood return. Covered with tegaderm, obtained labs and arrived to Infusion.

## 2015-12-30 NOTE — Patient Instructions (Addendum)
Dehydration, Adult Dehydration is a condition in which you do not have enough fluid or water in your body. It happens when you take in less fluid than you lose. Vital organs such as the kidneys, brain, and heart cannot function without a proper amount of fluids. Any loss of fluids from the body can cause dehydration.  Dehydration can range from mild to severe. This condition should be treated right away to help prevent it from becoming severe. CAUSES  This condition may be caused by:  Vomiting.  Diarrhea.  Excessive sweating, such as when exercising in hot or humid weather.  Not drinking enough fluid during strenuous exercise or during an illness.  Excessive urine output.  Fever.  Certain medicines. RISK FACTORS This condition is more likely to develop in:  People who are taking certain medicines that cause the body to lose excess fluid (diuretics).   People who have a chronic illness, such as diabetes, that may increase urination.  Older adults.   People who live at high altitudes.   People who participate in endurance sports.  SYMPTOMS  Mild Dehydration  Thirst.  Dry lips.  Slightly dry mouth.  Dry, warm skin. Moderate Dehydration  Very dry mouth.   Muscle cramps.   Dark urine and decreased urine production.   Decreased tear production.   Headache.   Light-headedness, especially when you stand up from a sitting position.  Severe Dehydration  Changes in skin.   Cold and clammy skin.   Skin does not spring back quickly when lightly pinched and released.   Changes in body fluids.   Extreme thirst.   No tears.   Not able to sweat when body temperature is high, such as in hot weather.   Minimal urine production.   Changes in vital signs.   Rapid, weak pulse (more than 100 beats per minute when you are sitting still).   Rapid breathing.   Low blood pressure.   Other changes.   Sunken eyes.   Cold hands and feet.    Confusion.  Lethargy and difficulty being awakened.  Fainting (syncope).   Short-term weight loss.   Unconsciousness. DIAGNOSIS  This condition may be diagnosed based on your symptoms. You may also have tests to determine how severe your dehydration is. These tests may include:   Urine tests.   Blood tests.  TREATMENT  Treatment for this condition depends on the severity. Mild or moderate dehydration can often be treated at home. Treatment should be started right away. Do not wait until dehydration becomes severe. Severe dehydration needs to be treated at the hospital. Treatment for Mild Dehydration  Drinking plenty of water to replace the fluid you have lost.   Replacing minerals in your blood (electrolytes) that you may have lost.  Treatment for Moderate Dehydration  Consuming oral rehydration solution (ORS). Treatment for Severe Dehydration  Receiving fluid through an IV tube.   Receiving electrolyte solution through a feeding tube that is passed through your nose and into your stomach (nasogastric tube or NG tube).  Correcting any abnormalities in electrolytes. HOME CARE INSTRUCTIONS   Drink enough fluid to keep your urine clear or pale yellow.   Drink water or fluid slowly by taking small sips. You can also try sucking on ice cubes.  Have food or beverages that contain electrolytes. Examples include bananas and sports drinks.  Take over-the-counter and prescription medicines only as told by your health care provider.   Prepare ORS according to the manufacturer's instructions. Take sips   of ORS every 5 minutes until your urine returns to normal.  If you have vomiting or diarrhea, continue to try to drink water, ORS, or both.   If you have diarrhea, avoid:   Beverages that contain caffeine.   Fruit juice.   Milk.   Carbonated soft drinks.  Do not take salt tablets. This can lead to the condition of having too much sodium in your body  (hypernatremia).  SEEK MEDICAL CARE IF:  You cannot eat or drink without vomiting.  You have had moderate diarrhea during a period of more than 24 hours.  You have a fever. SEEK IMMEDIATE MEDICAL CARE IF:   You have extreme thirst.  You have severe diarrhea.  You have not urinated in 6-8 hours, or you have urinated only a small amount of very dark urine.  You have shriveled skin.  You are dizzy, confused, or both.   This information is not intended to replace advice given to you by your health care provider. Make sure you discuss any questions you have with your health care provider.   Document Released: 09/12/2005 Document Revised: 06/03/2015 Document Reviewed: 01/28/2015 Elsevier Interactive Patient Education 2016 [place].   Blood Transfusion  A blood transfusion is a procedure in which you receive donated blood through an IV tube. You may need a blood transfusion because of illness, surgery, or injury. The blood may come from a donor, or it may be your own blood that you donated previously. The blood given in a transfusion is made up of different types of cells. You may receive:  Red blood cells. These carry oxygen and replace lost blood.  Platelets. These control bleeding.  Plasma. Thishelps blood to clot. If you have hemophilia or another clotting disorder, you may also receive other types of blood products. LET [facility] CARE PROVIDER KNOW ABOUT:  Any allergies you have.  All medicines you are taking, including vitamins, herbs, eye drops, creams, and over-the-counter medicines.  Previous problems you or members of your family have had with the use of anesthetics.  Any blood disorders you have.  Previous surgeries you have had.  Any medical conditions you may have.  Any previous reactions you have had during a blood transfusion.  RISKS AND COMPLICATIONS Generally, this is a safe procedure. However, problems may occur, including:  Having an  allergic reaction to something in the donated blood.  Fever. This may be a reaction to the white blood cells in the transfused blood.  Iron overload. This can happen from having many transfusions.  Transfusion-related acute lung injury (TRALI). This is a rare reaction that causes lung damage. The cause is not known.TRALI can occur within hours of a transfusion or several days later.  Sudden (acute) or delayed hemolytic reactions. This happens if your blood does not match the cells in your transfusion. Your body's defense system (immune system) may try to attack the new cells. This complication is rare.  Infection. This is rare. BEFORE THE PROCEDURE  You may have a blood test to determine your blood type. This is necessary to know what kind of blood your body will accept.  If you are going to have a planned surgery, you may donate your own blood. This may be done in case you need to have a transfusion.  If you have had an allergic reaction to a transfusion in the past, you may be given medicine to help prevent a reaction. Take this medicine only as directed by your health care provider.  You will have your temperature, blood pressure, and pulse monitored before the transfusion. PROCEDURE   An IV will be started in your hand or arm.  The bag of donated blood will be attached to your IV tube and given into your vein.  Your temperature, blood pressure, and pulse will be monitored regularly during the transfusion. This monitoring is done to detect early signs of a transfusion reaction.  If you have any signs or symptoms of a reaction, your transfusion will be stopped and you may be given medicine.  When the transfusion is over, your IV will be removed.  Pressure may be applied to the IV site for a few minutes.  A bandage (dressing) will be applied. The procedure may vary among health care providers and hospitals. AFTER THE PROCEDURE  Your blood pressure, temperature, and pulse will  be monitored regularly.   This information is not intended to replace advice given to you by your health care provider. Make sure you discuss any questions you have with your health care provider.   Document Released: 09/09/2000 Document Revised: 10/03/2014 Document Reviewed: 07/23/2014 Elsevier Interactive Patient Education [employer].

## 2015-12-30 NOTE — Procedures (Signed)
Patient came to IR with port flipped upside down.  Port was successfully manipulated into proper position without difficulty.  Patient stated that she was finished with all appointments at the [facility] today, but would be back tomorrow for fluids.  Dr. Vernard Gambles was informed.  Patient was instructed to call the [facility] IR department in the morning, if the portacath felt like it had flipped again so we could fix it and access it for the [place] appointment.  Patient given an appointment card with the [facility] IR contact information and voiced understanding of the instructions.  Patient's PIV was removed and patient sent home.

## 2015-12-30 NOTE — ED Notes (Signed)
HGB 5.7, Dr. Dolly Rias and Lambert Keto, RN made aware

## 2015-12-30 NOTE — Progress Notes (Signed)
Pharmacy Antibiotic Note  Debra Farrell is a 70 y.o. female who was seen at the [facility] earlier today for blood transfusion and was found to have an elevated temperature. After discharge, patient became lethargic at home and unable to follow commands. Patient presented to [facility] ED on 12/30/2015 with complaints of near syncope. Pharmacy has been consulted for Vancomycin and Zosyn dosing for suspected sepsis.      Plan: Vancomycin 1g IV x 1, then '500mg'$  IV q12h. Goal trough 15-20 mcg/mL. Plan for Vancomycin trough level at steady state. Zosyn 3.375g IV x 1 over 30 min in ED, then Zosyn 3.375g IV q8h (infuse over 4 hours). Monitor renal function, cultures, clinical course.     Temp (24hrs), Avg:100.8 F (38.2 C), Min:99.7 F (37.6 C), Max:102.8 F (39.3 C)   Recent Labs Lab 12/30/15 0947 12/30/15 2016  WBC 7.0  --   CREATININE 1.0  --   LATICACIDVEN  --  0.67    Estimated Creatinine Clearance: 50.4 mL/min (by C-G formula based on Cr of 1).    Allergies  Allergen Reactions  . Morphine And Related Nausea And Vomiting    Antimicrobials this admission: 4/5 >> Vancomycin >> 4/5 >> Zosyn >>  Dose adjustments this admission: ---  Microbiology results: 4/5 BCx x 2: sent 4/5 UCx: ordered   Thank you for allowing pharmacy to be a part of this patient's care.  Lindell Spar, PharmD, BCPS Pager: [phone] 12/30/2015 8:27 PM

## 2015-12-30 NOTE — Progress Notes (Signed)
Dr Alvy Bimler made aware of patient's HGB  Of 7.3 and elevated temp of 101.3.  Dr Alvy Bimler came to see patient while in infusion room.  Type and screen drawn, antibiotics called in to pt's pharmacy.  No blood cultures ordered at this time.  Patient scheduled to return tomorrow for blood transfusion and IVFs.  Patient also given IV dilaudid for pain. 1 unit PRBCs given today with temp of 100.3 per Dr Alvy Bimler. Per pt request, port left accessed since she will be returning tomorrow.

## 2015-12-30 NOTE — H&P (Signed)
PCP:  Kathrine Cords, MD  Oncology [facility]  Referring provider [place]   Chief Complaint:  fever  HPI: Debra Farrell is a 70 y.o. female   has a past medical history of Hypertension; Hyperlipidemia; Eczema; Thalassemia minor; Cancer of base of tongue ([place]) (10/14/2015); Laceration (04/2014); Intractable nausea and vomiting (11/13/2015); Abdominal abscess ([place]) (11/30/2015); Infection with methicillin-resistant Staphylococcus aureus (MRSA) (11/30/2015); Nausea without vomiting (12/16/2015); Anemia in chronic illness (12/16/2015); and Throat pain in adult (12/29/2015).   Presented with febrile episode after receiving Dulcolax suppository. For the past few days patient had had severe mucositis throat and mouth pain difficulty swallowing nausea and vomiting and have had severe constipation for past 5 days. She's been taking Percocet for her pain and try to use laxative. Patient has not been able to eat well and not able to tolerate her nutritional supplements. She has nutritional supplement through feeding pump but haven't been able to keep that down. Recently her port has malfunctioned apparently has flipped upside down and she required visit to interventional radiology where was manipulated back in the position. Currently supposed to be working. She was seen at [facility] today was noted to have hemoglobin of 7.3. On febrile up to 101.3 Patient was given IV Dilaudid for pain and she was given 1 unit of packed red blood cells at the time of completion temperature down to 100.3. She went home and was attempting to use a suppository her daughter stated patient became more more lethargic had significant chills. EMS was called oxygen saturation was 90% on arrival heart rate 120 fever up to 104.2.   IN ER: Patient meeting SIRS criteria temperature 102.8 heart rate up to 100 8:15 and respiratory rate up to 32 white blood cell count 9.7 hemoglobin down to 5.7 lactic acid 0.67 A she was started on vancomycin  and Zosyn she was given 1 L normal saline fentanyl 50 g and Toradol 50 mg Chest x-ray showed no active cardiopulmonary disease. 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 Status post cisplatin as well as radiation therapy complicated by radiation dermatitis and severe side effects from cisplatin had to discontinue treatment History of intra-abdominal infection with MRSA associated with G tube isertion requiring placement drainage tube and IV antibiotics with history of large abdominal abscess in February with repeat CT scan from March showing resolution of an abscess. She has been on IV vanc until 3/24.  Hospitalist was called for admission for sirs and severe anemia in a setting of stage IV tongue cancer complicated by mucositis  Review of Systems:    Pertinent positives include: Fevers, chills, fatigue, weight loss nausea, vomiting, constipation  Constitutional:  No weight loss, night sweats,  HEENT:  No headaches, Difficulty swallowing,Tooth/dental problems,Sore throat,  No sneezing, itching, ear ache, nasal congestion, post nasal drip,  Cardio-vascular:  No chest pain, Orthopnea, PND, anasarca, dizziness, palpitations.no Bilateral lower extremity swelling  GI:  No heartburn, indigestion, abdominal pain, diarrhea, change in bowel habits, loss of appetite, melena, blood in stool, hematemesis Resp:  no shortness of breath at rest. No dyspnea on exertion, No excess mucus, no productive cough, No non-productive cough, No coughing up of blood. No change in color of mucus.No wheezing. Skin:  no rash or lesions. No jaundice GU:  no dysuria, change in color of urine, no urgency or frequency. No straining to urinate.  No flank pain.  Musculoskeletal:  No joint pain or no joint swelling. No decreased range of motion. No back pain.  Psych:  No change in mood  or affect. No depression or anxiety. No memory loss.  Neuro: no localizing neurological complaints, no tingling, no weakness, no double vision, no gait abnormality, no slurred speech, no confusion  Otherwise ROS are negative except for above, 10 systems were reviewed  Past Medical History: Past Medical History  Diagnosis Date  . Hypertension   . Hyperlipidemia   . Eczema   . Thalassemia minor   . Cancer of base of tongue ([place]) 10/14/2015  . Laceration 04/2014    around R ear, for falling out of bed & hitting nightstand  . Intractable nausea and vomiting 11/13/2015  . Abdominal abscess ([place]) 11/30/2015  . Infection with methicillin-resistant Staphylococcus aureus (MRSA) 11/30/2015  . Nausea without vomiting 12/16/2015  . Anemia in chronic illness 12/16/2015  . Throat pain in adult 12/29/2015   Past Surgical History  Procedure Laterality Date  . Abdominal hysterectomy  1990    partial  . Incontinence surgery  1990, [id number]  . Rectocele repair    . Urocele      correction surgery  . Tonsillectomy      as a child  . Direct laryngoscopy N/A 10/28/2015    Procedure: DIRECT LARYNGOSCOPY WITH BIOPSY;  Surgeon: [name], MD;  Location: [facility];  Service: ENT;  Laterality: N/A;  . Esophagoscopy N/A 10/28/2015    Procedure: ESOPHAGOSCOPY;  Surgeon: [name], MD;  Location: [facility];  Service: ENT;  Laterality: N/A;  . Laryngoscopy and bronchoscopy N/A 10/28/2015    Procedure: BRONCHOSCOPY;  Surgeon: [name], MD;  Location: [facility];  Service: ENT;  Laterality: N/A;  . Multiple extractions with alveoloplasty N/A 10/28/2015    Procedure: Extraction of tooth #'s 2-12, 14,15,17,18,20-29, 31 with alveoloplasy and mandibular left torus reduction;  Surgeon: Lenn Cal, DDS;  Location: [facility];  Service: Oral Surgery;  Laterality: N/A;     Medications: Prior to Admission medications   Medication Sig Start Date End Date Taking? Authorizing Provider  acetaminophen (TYLENOL) 325 MG tablet Take  650 mg by mouth every 6 (six) hours as needed for moderate pain.   Yes Historical Provider, MD  amoxicillin-clavulanate (AUGMENTIN) 875-125 MG tablet Take 1 tablet by mouth 2 (two) times daily. 12/30/15  Yes [name], MD  fentaNYL (DURAGESIC - DOSED MCG/HR) 25 MCG/HR patch Place 1 patch (25 mcg total) onto the skin every 3 (three) days. 12/29/15  Yes [name], MD  HYDROmorphone (DILAUDID) 4 MG tablet Take 1 tablet (4 mg total) by mouth every 4 (four) hours as needed for severe pain. 12/29/15  Yes [name], MD  lidocaine-prilocaine (EMLA) cream Apply to affected area once 11/10/15  Yes [name], MD  metoCLOPramide (REGLAN) 10 MG tablet Take 1 tablet (10 mg total) by mouth 4 (four) times daily -  before meals and at bedtime. 12/16/15  Yes [name], MD  Nutritional Supplements (FEEDING SUPPLEMENT, OSMOLITE 1.5 CAL,) LIQD Osmolite 1.5 at 60 mL for 12 hours from 7p-7a. 1 can Osmolite 1.5 TID at 9,12,3 with 60 mL free water before and after bolus and continuous feedings. 74mPromod with 240 cc free water once daily. Additional 3648mfree water flush daily 12/29/15  Yes NiHeath LarkMD  ondansetron (ZOFRAN) 8 MG tablet Take 1 tablet (8 mg total) by mouth 2 (two) times daily as needed. Start on the third day after chemotherapy. 11/10/15  Yes Ni  Alvy Bimler, MD  sucralfate (CARAFATE) 1 g tablet Dissolve 1 tablet in 10 mL H20 and swallow 30 min prior to meals and bedtime. Patient not taking: Reported on 12/29/2015 11/30/15   [name], MD    Allergies:   Allergies  Allergen Reactions  . Morphine And Related Nausea And Vomiting    Social History:  Ambulatory   independently   Lives at home  With granddaughter      reports that she has never smoked. She has never used smokeless tobacco. She reports that she does not drink alcohol or use illicit drugs.     Family History: family history includes Asthma in her father; Cancer in her mother.    Physical Exam: Patient Vitals for the past 24 hrs:  BP Temp  Temp src Pulse Resp SpO2  12/30/15 2111 155/85 mmHg - - 112 (!) 32 91 %  12/30/15 2030 156/76 mmHg - - 106 23 93 %  12/30/15 1930 164/72 mmHg - - 108 15 95 %  12/30/15 1929 - 102.8 F (39.3 C) Rectal - - -  12/30/15 1928 178/83 mmHg 101.3 F (38.5 C) Oral 115 20 96 %    1. General:  in No Acute distress 2. Psychological: Alert and Oriented X 4 3. Head/ENT:    ry Mucous Membranes                          Head Non traumatic, neck supple                            Poor Dentition 4. SKIN: decreased Skin turgor,  Skin clean Dry and intact no rash 5. Heart: Regular rate and rhythm no  Murmur, Rub or gallop 6. Lungs: Clear to auscultation bilaterally, no wheezes or crackles   7. Abdomen: Soft, non-tender,  distended 8. Lower extremities: no clubbing, cyanosis, or edema 9. Neurologically Grossly intact, moving all 4 extremities equally 10. MSK: Normal range of motion Hemoccult negative   body mass index is unknown because there is no weight on file.   Labs on Admission:   Results for orders placed or performed during the hospital encounter of 12/30/15 (from the past 24 hour(s))  CBG monitoring, ED     Status: Abnormal   Collection Time: 12/30/15  7:34 PM  Result Value Ref Range   Glucose-Capillary 112 (H) 65 - 99 mg/dL   Comment 1 Notify RN    Comment 2 Document in Chart   Comprehensive metabolic panel     Status: Abnormal   Collection Time: 12/30/15  8:07 PM  Result Value Ref Range   Sodium 132 (L) 135 - 145 mmol/L   Potassium 3.9 3.5 - 5.1 mmol/L   Chloride 98 (L) 101 - 111 mmol/L   CO2 25 22 - 32 mmol/L   Glucose, Bld 126 (H) 65 - 99 mg/dL   BUN 26 (H) 6 - 20 mg/dL   Creatinine, Ser 0.93 0.44 - 1.00 mg/dL   Calcium 8.7 (L) 8.9 - 10.3 mg/dL   Total Protein 6.3 (L) 6.5 - 8.1 g/dL   Albumin 3.1 (L) 3.5 - 5.0 g/dL   AST 31 15 - 41 U/L   ALT 36 14 - 54 U/L   Alkaline Phosphatase 87 38 - 126 U/L   Total Bilirubin 1.2 0.3 - 1.2 mg/dL   GFR calc non Af Amer >60 >60 mL/min    GFR calc Af Amer >  60 >60 mL/min   Anion gap 9 5 - 15  CBC with Differential     Status: Abnormal   Collection Time: 12/30/15  8:07 PM  Result Value Ref Range   WBC 9.7 4.0 - 10.5 K/uL   RBC 2.50 (L) 3.87 - 5.11 MIL/uL   Hemoglobin 5.7 (LL) 12.0 - 15.0 g/dL   HCT 17.1 (L) 36.0 - 46.0 %   MCV 68.4 (L) 78.0 - 100.0 fL   MCH 22.8 (L) 26.0 - 34.0 pg   MCHC 33.3 30.0 - 36.0 g/dL   RDW 23.8 (H) 11.5 - 15.5 %   Platelets 289 150 - 400 K/uL   Neutrophils Relative % 85 %   Lymphocytes Relative 7 %   Monocytes Relative 7 %   Eosinophils Relative 1 %   Basophils Relative 0 %   Neutro Abs 8.2 (H) 1.7 - 7.7 K/uL   Lymphs Abs 0.7 0.7 - 4.0 K/uL   Monocytes Absolute 0.7 0.1 - 1.0 K/uL   Eosinophils Absolute 0.1 0.0 - 0.7 K/uL   Basophils Absolute 0.0 0.0 - 0.1 K/uL   RBC Morphology POLYCHROMASIA PRESENT   I-Stat CG4 Lactic Acid, ED  (not at [facility])     Status: None   Collection Time: 12/30/15  8:16 PM  Result Value Ref Range   Lactic Acid, Venous 0.67 0.5 - 2.0 mmol/L  POC occult blood, ED Provider will collect     Status: None   Collection Time: 12/30/15  8:58 PM  Result Value Ref Range   Fecal Occult Bld NEGATIVE NEGATIVE    UA   no evidence of UTI  Lab Results  Component Value Date   HGBA1C 5.6 11/22/2011    Estimated Creatinine Clearance: 54.2 mL/min (by C-G formula based on Cr of 0.93).  BNP (last 3 results) No results for input(s): PROBNP in the last 8760 hours.  Other results:  I have pearsonaly reviewed this:   There were no vitals filed for this visit.   Cultures:    Component Value Date/Time   SDES DRAINAGE 11/25/2015 1547   SPECREQUEST Normal 11/25/2015 1547   CULT  11/25/2015 1547    FEW METHICILLIN RESISTANT STAPHYLOCOCCUS AUREUS Note: RIFAMPIN AND GENTAMICIN SHOULD NOT BE USED AS SINGLE DRUGS FOR TREATMENT OF STAPH INFECTIONS. This organism DOES NOT demonstrate inducible Clindamycin resistance in vitro. CRITICAL RESULT CALLED TO, READ BACK BY AND VERIFIED  WITH: DIANA T. AT  8:43AM ON 11/29/15 HAJAM Performed at [facility] 11/29/2015 FINAL 11/25/2015 1547     Radiological Exams on Admission: Dg Chest 2 View  12/30/2015  CLINICAL DATA:  Lethargic.  Near syncope.  Confusion. EXAM: CHEST  2 VIEW COMPARISON:  11/22/2015 FINDINGS: There is a right jugular Port-A-Cath with tip in the SVC just below the azygos vein junction. The lungs are clear. There is no effusion. Hilar and mediastinal contours are unremarkable and unchanged. Heart size is normal, unchanged. IMPRESSION: No active cardiopulmonary disease. Electronically Signed   By: Andreas Newport M.D.   On: 12/30/2015 19:59   Ir Patient Eval Tech 0-60 Mins  12/30/2015  Serena Croissant     12/30/2015  9:07 AM Patient came to IR with port flipped upside down.  Port was successfully manipulated into proper position without difficulty.  Patient stated that she was finished with all appointments at the [facility] today, but would be back tomorrow for fluids.  Dr. Vernard Gambles was informed.  Patient was instructed to call the [facility]  IR department in the morning, if the portacath felt like it had flipped again so we could fix it and access it for the [facility] appointment.  Patient given an appointment card with the [facility] IR contact information and voiced understanding of the instructions.  Patient's PIV was removed and patient sent home.   Chart has been reviewed  Family   at  Bedside  plan of care was discussed with   Daughter Christen Butter POA [phone]  Assessment/Plan  70 year old [place] with diagnosis of tongue cancer status post radiation therapy complicated by mucositis admitted for SIRS and dehydration with worsening anemia Present on Admission:  . Anemia due to other cause most likely secondary to chronic disease secondary to cancer- unsure why Hg dropped discussed with hematology oncology Dr. Alvy Bimler recommends repeating CBC if there is still evidence of  hemoglobin being below 7 we'll transfuse 2 units,  Ordered haptoglobin and Coombs test but hemolysis being less likely more likely secondary to dilution  Hemoccult-negative no history of bleeding  . Cancer of base of tongue ([place]) - discuss case with Dr. Alvy Bimler patient's oncologist was aware  . Constipation due to opioid therapy - will obtain imaging if there's evidence of significant stool burden will initiate enema  . Dehydration administer IV fluids patient have had poor by mouth intake secondary to significant mucositis due to radiation therapy  . Essential hypertension - currently stable hold off all medications  . SIRS (systemic inflammatory response syndrome) (HCC) - etiology unclear Will obtain influenza PCR, blood cultures pending UA appears to have no evidence of urinary tract infection patient have had in the past MRSA intra-abdominal abscess will reevaluate tube CT as well as that would help to evaluate for possible colitis secondary to severe constipation and or any evidence of obstruction.   Prophylaxis:  Lovenox   CODE STATUS:  FULL CODE   as per patient    Disposition:  To home once workup is complete and patient is stable  Other plan as per orders.  I have spent a total of 67 min on this admission   extra time was spent to discuss case with Dr. Kara Pacer 12/31/2015, 12:08 AM    [facility]  Pager [phone]   after 2 AM please page floor coverage PA If 7AM-7PM, please contact the day team taking care of the patient  [email or web address]  Password [email or web address]

## 2015-12-30 NOTE — ED Notes (Signed)
Main lab will attempt to draw blood.

## 2015-12-30 NOTE — ED Notes (Signed)
Patient arrives by EMS with complaints of near syncope.  Patient is a cancer patient, came here earlier for treatment, home at 1400, had a suppository to take, difficulty inserting suppository per daughter, daughter states lethargic and would not follow commands, staring.  EMS states on their arrival, patient was altered, could not tell them her daughter's name, or date, day of week.  O2 sat 90% per EMS, HR 120, fever 104.2. No BM in 5 days, states trying to give dulcolax suppository.  NO tylenol has been given.  Tylenol given during treatment today, she also had a blood transfusion. Temp 100.0 when she left out-pt today. #20 left forearm, 100 cc fluid per EMS.

## 2015-12-30 NOTE — ED Notes (Signed)
Nurse will draw blood from port.

## 2015-12-30 NOTE — ED Provider Notes (Signed)
CSN: [id number]     Arrival date & time 12/30/15  1910 History   First MD Initiated Contact with Patient 12/30/15 1946     Chief Complaint  Patient presents with  . Near Syncope     (Consider location/radiation/quality/duration/timing/severity/associated sxs/prior Treatment) Patient is a 70 y.o. female presenting with weakness.  Weakness This is a new problem. The problem occurs constantly. The problem has been gradually worsening. Associated symptoms include shortness of breath. Pertinent negatives include no chest pain and no abdominal pain. Treatments tried: augmentin.    Past Medical History  Diagnosis Date  . Hypertension   . Hyperlipidemia   . Eczema   . Thalassemia minor   . 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 Laceration 04/2014    around R ear, for falling out of bed & hitting nightstand  . Intractable nausea and vomiting 11/13/2015  . Abdominal abscess ([place]) 11/30/2015  . Infection with methicillin-resistant Staphylococcus aureus (MRSA) 11/30/2015  . Nausea without vomiting 12/16/2015  . Anemia in chronic illness 12/16/2015  . Throat pain in adult 12/29/2015   Past Surgical History  Procedure Laterality Date  . Abdominal hysterectomy  1990    partial  . Incontinence surgery  1990, [id number]  . Rectocele repair    . Urocele      correction surgery  . Tonsillectomy      as a child  . Direct laryngoscopy N/A 10/28/2015    Procedure: DIRECT LARYNGOSCOPY WITH BIOPSY;  Surgeon: [name], MD;  Location: [facility];  Service: ENT;  Laterality: N/A;  . Esophagoscopy N/A 10/28/2015    Procedure: ESOPHAGOSCOPY;  Surgeon: [name], MD;  Location: [facility];  Service: ENT;  Laterality: N/A;  . Laryngoscopy and bronchoscopy N/A 10/28/2015    Procedure: BRONCHOSCOPY;  Surgeon: [name], MD;  Location: [facility];  Service: ENT;  Laterality: N/A;  . Multiple extractions with alveoloplasty N/A 10/28/2015    Procedure: Extraction of tooth #'s 2-12, 14,15,17,18,20-29, 31 with alveoloplasy  and mandibular left torus reduction;  Surgeon: Lenn Cal, DDS;  Location: [facility];  Service: Oral Surgery;  Laterality: N/A;   Family History  Problem Relation Age of Onset  . Cancer Mother     lung  . Asthma Father    Social History  Substance Use Topics  . Smoking status: Never Smoker   . Smokeless tobacco: Never Used  . Alcohol Use: No   OB History    No data available     Review of Systems  Constitutional: Positive for fever, chills, activity change, appetite change and fatigue.  HENT: Positive for congestion. Negative for facial swelling.   Respiratory: Positive for cough and shortness of breath.   Cardiovascular: Negative for chest pain.  Gastrointestinal: Negative for abdominal pain.  Musculoskeletal: Negative for neck pain.  Neurological: Positive for weakness.  All other systems reviewed and are negative.     Allergies  Morphine and related  Home Medications   Prior to Admission medications   Medication Sig Start Date End Date Taking? Authorizing Provider  acetaminophen (TYLENOL) 325 MG tablet Take 650 mg by mouth every 6 (six) hours as needed for moderate pain.   Yes Historical Provider, MD  amoxicillin-clavulanate (AUGMENTIN) 875-125 MG tablet Take 1 tablet by mouth 2 (two) times daily. 12/30/15  Yes [name], MD  fentaNYL (DURAGESIC - DOSED MCG/HR) 25 MCG/HR patch Place 1 patch (25 mcg total) onto the skin every 3 (three) days. 12/29/15  Yes [name], MD  HYDROmorphone (  DILAUDID) 4 MG tablet Take 1 tablet (4 mg total) by mouth every 4 (four) hours as needed for severe pain. 12/29/15  Yes [name], MD  lidocaine-prilocaine (EMLA) cream Apply to affected area once 11/10/15  Yes [name], MD  metoCLOPramide (REGLAN) 10 MG tablet Take 1 tablet (10 mg total) by mouth 4 (four) times daily -  before meals and at bedtime. 12/16/15  Yes [name], MD  Nutritional Supplements (FEEDING SUPPLEMENT, OSMOLITE 1.5 CAL,) LIQD Osmolite 1.5 at 60 mL for 12 hours from  7p-7a. 1 can Osmolite 1.5 TID at 9,12,3 with 60 mL free water before and after bolus and continuous feedings. 17mPromod with 240 cc free water once daily. Additional 3652mfree water flush daily 12/29/15  Yes NiHeath LarkMD  ondansetron (ZOFRAN) 8 MG tablet Take 1 tablet (8 mg total) by mouth 2 (two) times daily as needed. Start on the third day after chemotherapy. 11/10/15  Yes NiHeath LarkMD  sucralfate (CARAFATE) 1 g tablet Dissolve 1 tablet in 10 mL H20 and swallow 30 min prior to meals and bedtime. Patient not taking: Reported on 12/29/2015 11/30/15   SaEppie GibsonMD   BP 164/72 mmHg  Pulse 108  Temp(Src) 102.8 F (39.3 C) (Rectal)  Resp 15  SpO2 95% Physical Exam  Constitutional: She is oriented to person, place, and time. She appears well-developed and well-nourished.  HENT:  Head: Normocephalic and atraumatic.  Eyes: EOM are normal. Pupils are equal, round, and reactive to light.  Neck: Normal range of motion.  Cardiovascular: Regular rhythm.  Tachycardia present.   Pulmonary/Chest: No stridor. No respiratory distress. She has wheezes. She has rales.  Abdominal: Soft. She exhibits no distension. There is no tenderness. There is no rebound and no guarding.  Musculoskeletal: Normal range of motion. She exhibits no edema or tenderness.  Neurological: She is alert and oriented to person, place, and time. No cranial nerve deficit.  Skin: Skin is warm and dry. No rash noted. No erythema.  Nursing note and vitals reviewed.   ED Course  Procedures (including critical care time)  CRITICAL CARE Performed by: MeMerrily Pew Total critical care time: 35 minutes  Critical care time was exclusive of separately billable procedures and treating other patients.  Critical care was necessary to treat or prevent imminent or life-threatening deterioration.  Critical care was time spent personally by me on the following activities: development of treatment plan with patient and/or surrogate as  well as nursing, discussions with consultants, evaluation of patient's response to treatment, examination of patient, obtaining history from patient or surrogate, ordering and performing treatments and interventions, ordering and review of laboratory studies, ordering and review of radiographic studies, pulse oximetry and re-evaluation of patient's condition.   Labs Review Labs Reviewed  CBC WITH DIFFERENTIAL/PLATELET - Abnormal; Notable for the following:    RBC 2.50 (*)    Hemoglobin 5.7 (*)    HCT 17.1 (*)    MCV 68.4 (*)    MCH 22.8 (*)    RDW 23.8 (*)    All other components within normal limits  CBG MONITORING, ED - Abnormal; Notable for the following:    Glucose-Capillary 112 (*)    All other components within normal limits  CULTURE, BLOOD (ROUTINE X 2)  CULTURE, BLOOD (ROUTINE X 2)  URINE CULTURE  COMPREHENSIVE METABOLIC PANEL  URINALYSIS, ROUTINE W REFLEX MICROSCOPIC ([facility] I-STAT CG4 LACTIC ACID, ED    Imaging Review Dg Chest 2 View  12/30/2015  CLINICAL DATA:  Lethargic.  Near syncope.  Confusion. EXAM: CHEST  2 VIEW COMPARISON:  11/22/2015 FINDINGS: There is a right jugular Port-A-Cath with tip in the SVC just below the azygos vein junction. The lungs are clear. There is no effusion. Hilar and mediastinal contours are unremarkable and unchanged. Heart size is normal, unchanged. IMPRESSION: No active cardiopulmonary disease. Electronically Signed   By: Andreas Newport M.D.   On: 12/30/2015 19:59   Ir Patient Eval Tech 0-60 Mins  12/30/2015  Serena Croissant     12/30/2015  9:07 AM Patient came to IR with port flipped upside down.  Port was successfully manipulated into proper position without difficulty.  Patient stated that she was finished with all appointments at the [facility] today, but would be back tomorrow for fluids.  Dr. Vernard Gambles was informed.  Patient was instructed to call the [facility] IR department in the morning, if the portacath felt like it had flipped  again so we could fix it and access it for the [place] appointment.  Patient given an appointment card with the [facility] IR contact information and voiced understanding of the instructions.  Patient's PIV was removed and patient sent home.  I have personally reviewed and evaluated these images and lab results as part of my medical decision-making.   EKG Interpretation None      MDM   Final diagnoses:  SIRS (systemic inflammatory response syndrome) (HCC)  Throat pain in adult  Radiation dermatitis  Anemia in chronic illness  Neutropenic fever ([place])    70 yo F w/ h/o throat cancer here with sepsis (likely respiratory) and anemia. Type/screen done, blood ordered. Vanc/zosyn and fluids given. No e/o severe sepsis or septic shock, normal lactate. No need for pressors or further interventions. Admitted to medicine.     Merrily Pew, MD 12/30/15 [phone]

## 2015-12-30 NOTE — ED Notes (Signed)
8mg Fentanyl patch removed from patient's left upper arm per Dr. MDolly Rias

## 2015-12-30 NOTE — ED Notes (Signed)
Bed: RESB Expected date:  Expected time:  Means of arrival:  Comments: EMS 

## 2015-12-31 ENCOUNTER — Other Ambulatory Visit: Payer: Self-pay | Admitting: Hematology and Oncology

## 2015-12-31 ENCOUNTER — Ambulatory Visit: Payer: 59

## 2015-12-31 ENCOUNTER — Ambulatory Visit: Admit: 2015-12-31 | Payer: 59

## 2015-12-31 ENCOUNTER — Ambulatory Visit
Admit: 2015-12-31 | Discharge: 2015-12-31 | Disposition: A | Discharge status: Home / Self Care | Payer: 59 | Attending: Radiation Oncology | Admitting: Radiation Oncology

## 2015-12-31 ENCOUNTER — Other Ambulatory Visit: Payer: Self-pay

## 2015-12-31 DIAGNOSIS — L589 Radiodermatitis, unspecified: Secondary | ICD-10-CM

## 2015-12-31 DIAGNOSIS — K5903 Drug induced constipation: Secondary | ICD-10-CM

## 2015-12-31 DIAGNOSIS — R410 Disorientation, unspecified: Secondary | ICD-10-CM

## 2015-12-31 DIAGNOSIS — R509 Fever, unspecified: Secondary | ICD-10-CM

## 2015-12-31 DIAGNOSIS — R070 Pain in throat: Secondary | ICD-10-CM

## 2015-12-31 DIAGNOSIS — K1233 Oral mucositis (ulcerative) due to radiation: Secondary | ICD-10-CM | POA: Diagnosis present

## 2015-12-31 DIAGNOSIS — R112 Nausea with vomiting, unspecified: Secondary | ICD-10-CM

## 2015-12-31 DIAGNOSIS — Z515 Encounter for palliative care: Secondary | ICD-10-CM | POA: Diagnosis present

## 2015-12-31 DIAGNOSIS — E43 Unspecified severe protein-calorie malnutrition: Secondary | ICD-10-CM

## 2015-12-31 DIAGNOSIS — R1310 Dysphagia, unspecified: Secondary | ICD-10-CM

## 2015-12-31 DIAGNOSIS — C01 Malignant neoplasm of base of tongue: Secondary | ICD-10-CM

## 2015-12-31 DIAGNOSIS — D638 Anemia in other chronic diseases classified elsewhere: Secondary | ICD-10-CM

## 2015-12-31 LAB — CBC
HEMATOCRIT: 21.2 % — AB (ref 36.0–46.0)
HEMATOCRIT: 21.5 % — AB (ref 36.0–46.0)
HEMOGLOBIN: 7.1 g/dL — AB (ref 12.0–15.0)
Hemoglobin: 7.1 g/dL — ABNORMAL LOW (ref 12.0–15.0)
MCH: 22.7 pg — AB (ref 26.0–34.0)
MCH: 22.9 pg — ABNORMAL LOW (ref 26.0–34.0)
MCHC: 33 g/dL (ref 30.0–36.0)
MCHC: 33.5 g/dL (ref 30.0–36.0)
MCV: 68.4 fL — ABNORMAL LOW (ref 78.0–100.0)
MCV: 68.7 fL — AB (ref 78.0–100.0)
Platelets: 219 10*3/uL (ref 150–400)
Platelets: 212 10*3/uL (ref 150–400)
RBC: 3.1 MIL/uL — AB (ref 3.87–5.11)
RBC: 3.13 MIL/uL — AB (ref 3.87–5.11)
RDW: 23.7 % — AB (ref 11.5–15.5)
RDW: 23.7 % — ABNORMAL HIGH (ref 11.5–15.5)
WBC: 5.1 10*3/uL (ref 4.0–10.5)
WBC: 5.8 10*3/uL (ref 4.0–10.5)

## 2015-12-31 LAB — TYPE AND SCREEN
ABO/RH(D): O POS
Antibody Screen: NEGATIVE
UNIT DIVISION: 0
UNIT DIVISION: 0

## 2015-12-31 LAB — VITAMIN B12: VITAMIN B 12: 374 pg/mL (ref 180–914)

## 2015-12-31 LAB — TECHNOLOGIST SMEAR REVIEW

## 2015-12-31 LAB — COMPREHENSIVE METABOLIC PANEL
ALT: 31 U/L (ref 14–54)
ANION GAP: 8 (ref 5–15)
AST: 27 U/L (ref 15–41)
Albumin: 2.7 g/dL — ABNORMAL LOW (ref 3.5–5.0)
Alkaline Phosphatase: 73 U/L (ref 38–126)
BILIRUBIN TOTAL: 1.2 mg/dL (ref 0.3–1.2)
BUN: 22 mg/dL — AB (ref 6–20)
CHLORIDE: 102 mmol/L (ref 101–111)
CO2: 25 mmol/L (ref 22–32)
Calcium: 8.5 mg/dL — ABNORMAL LOW (ref 8.9–10.3)
Creatinine, Ser: 0.9 mg/dL (ref 0.44–1.00)
Glucose, Bld: 114 mg/dL — ABNORMAL HIGH (ref 65–99)
POTASSIUM: 3.6 mmol/L (ref 3.5–5.1)
Sodium: 135 mmol/L (ref 135–145)
TOTAL PROTEIN: 5.7 g/dL — AB (ref 6.5–8.1)

## 2015-12-31 LAB — HEMOGLOBIN AND HEMATOCRIT, BLOOD
HEMATOCRIT: 25.6 % — AB (ref 36.0–46.0)
HEMOGLOBIN: 8.8 g/dL — AB (ref 12.0–15.0)

## 2015-12-31 LAB — MRSA PCR SCREENING: MRSA by PCR: POSITIVE — AB

## 2015-12-31 LAB — PROCALCITONIN: PROCALCITONIN: 0.27 ng/mL

## 2015-12-31 LAB — MAGNESIUM: MAGNESIUM: 1.5 mg/dL — AB (ref 1.7–2.4)

## 2015-12-31 LAB — FERRITIN: Ferritin: 435 ng/mL — ABNORMAL HIGH (ref 11–307)

## 2015-12-31 LAB — GLUCOSE, CAPILLARY: Glucose-Capillary: 177 mg/dL — ABNORMAL HIGH (ref 65–99)

## 2015-12-31 LAB — INFLUENZA PANEL BY PCR (TYPE A & B)
H1N1FLUPCR: NOT DETECTED
Influenza A By PCR: NEGATIVE
Influenza B By PCR: NEGATIVE

## 2015-12-31 LAB — IRON AND TIBC
IRON: 17 ug/dL — AB (ref 28–170)
SATURATION RATIOS: 10 % — AB (ref 10.4–31.8)
TIBC: 172 ug/dL — ABNORMAL LOW (ref 250–450)
UIBC: 155 ug/dL

## 2015-12-31 LAB — PREPARE RBC (CROSSMATCH)

## 2015-12-31 LAB — PHOSPHORUS: PHOSPHORUS: 3 mg/dL (ref 2.5–4.6)

## 2015-12-31 LAB — TSH: TSH: 0.318 u[IU]/mL — AB (ref 0.350–4.500)

## 2015-12-31 LAB — FOLATE: Folate: 28.6 ng/mL (ref >5.9)

## 2015-12-31 MED ORDER — OSMOLITE 1.5 CAL PO LIQD
1000.0000 mL | ORAL | Status: DC
  Administered 2015-12-31: 1000 mL
  Filled 2015-12-31: qty 1000

## 2015-12-31 MED ORDER — SODIUM CHLORIDE 0.9% FLUSH
10.0000 mL | Freq: Two times a day (BID) | INTRAVENOUS | Status: DC
  Administered 2016-01-02: 10 mL
  Administered 2016-01-02: 20 mL

## 2015-12-31 MED ORDER — SODIUM CHLORIDE 0.9 % IV SOLN
INTRAVENOUS | Status: DC
  Administered 2015-12-31 – 2016-01-04 (×8): via INTRAVENOUS

## 2015-12-31 MED ORDER — ACETAMINOPHEN 325 MG PO TABS: 650.0000 mg | ORAL_TABLET | Freq: Once | ORAL | Status: DC

## 2015-12-31 MED ORDER — METOCLOPRAMIDE HCL 10 MG PO TABS
10.0000 mg | ORAL_TABLET | Freq: Three times a day (TID) | ORAL | Status: DC
  Administered 2015-12-31 – 2016-01-04 (×16): 10 mg via ORAL
  Filled 2015-12-31 (×23): qty 1

## 2015-12-31 MED ORDER — MAGNESIUM SULFATE 4 GM/100ML IV SOLN
4.0000 g | Freq: Once | INTRAVENOUS | Status: AC
  Administered 2015-12-31: 4 g via INTRAVENOUS
  Filled 2015-12-31: qty 100

## 2015-12-31 MED ORDER — JEVITY 1.2 CAL PO LIQD: 1000.0000 mL | ORAL | Status: DC

## 2015-12-31 MED ORDER — SODIUM CHLORIDE 0.9 % IV SOLN
INTRAVENOUS | Status: AC
  Administered 2015-12-31: 02:00:00 via INTRAVENOUS

## 2015-12-31 MED ORDER — SENNOSIDES-DOCUSATE SODIUM 8.6-50 MG PO TABS
1.0000 | ORAL_TABLET | Freq: Two times a day (BID) | ORAL | Status: DC
  Administered 2015-12-31 – 2016-01-01 (×3): 1 via ORAL
  Filled 2015-12-31 (×3): qty 1

## 2015-12-31 MED ORDER — MAGIC MOUTHWASH W/LIDOCAINE
15.0000 mL | Freq: Four times a day (QID) | ORAL | Status: DC
  Administered 2015-12-31 – 2016-01-02 (×8): 15 mL via ORAL
  Filled 2015-12-31 (×25): qty 15

## 2015-12-31 MED ORDER — OSMOLITE 1.5 CAL PO LIQD
237.0000 mL | Freq: Three times a day (TID) | ORAL | Status: DC
  Administered 2015-12-31 – 2016-01-05 (×15): 237 mL
  Filled 2015-12-31 (×19): qty 237

## 2015-12-31 MED ORDER — SENNOSIDES-DOCUSATE SODIUM 8.6-50 MG PO TABS: 1.0000 | ORAL_TABLET | Freq: Two times a day (BID) | ORAL | Status: DC

## 2015-12-31 MED ORDER — IOPAMIDOL (ISOVUE-300) INJECTION 61%
100.0000 mL | Freq: Once | INTRAVENOUS | Status: AC | PRN
  Administered 2015-12-31: 100 mL via INTRAVENOUS

## 2015-12-31 MED ORDER — ONDANSETRON HCL 4 MG/2ML IJ SOLN
4.0000 mg | Freq: Four times a day (QID) | INTRAMUSCULAR | Status: DC | PRN
  Administered 2015-12-31 – 2016-01-03 (×5): 4 mg via INTRAVENOUS
  Filled 2015-12-31 (×6): qty 2

## 2015-12-31 MED ORDER — HYDROMORPHONE HCL 1 MG/ML IJ SOLN
0.5000 mg | INTRAMUSCULAR | Status: DC | PRN
  Administered 2015-12-31 – 2016-01-05 (×27): 0.5 mg via INTRAVENOUS
  Filled 2015-12-31 (×27): qty 1

## 2015-12-31 MED ORDER — FENTANYL 25 MCG/HR TD PT72: 25.0000 ug | MEDICATED_PATCH | TRANSDERMAL | Status: DC

## 2015-12-31 MED ORDER — DIPHENHYDRAMINE HCL 25 MG PO CAPS
25.0000 mg | ORAL_CAPSULE | Freq: Once | ORAL | Status: AC
  Administered 2015-12-31: 25 mg via ORAL
  Filled 2015-12-31: qty 1

## 2015-12-31 MED ORDER — GUAIFENESIN ER 600 MG PO TB12: 1200.0000 mg | ORAL_TABLET | Freq: Two times a day (BID) | ORAL | Status: DC

## 2015-12-31 MED ORDER — FENTANYL 25 MCG/HR TD PT72
25.0000 ug | MEDICATED_PATCH | TRANSDERMAL | Status: DC
  Administered 2015-12-31: 25 ug via TRANSDERMAL
  Filled 2015-12-31: qty 1

## 2015-12-31 MED ORDER — BISACODYL 10 MG RE SUPP: 10.0000 mg | Freq: Every day | RECTAL | Status: DC | PRN

## 2015-12-31 MED ORDER — SODIUM CHLORIDE 0.9 % IV SOLN: INTRAVENOUS | Status: DC

## 2015-12-31 MED ORDER — SODIUM CHLORIDE 0.9% FLUSH
10.0000 mL | INTRAVENOUS | Status: DC | PRN
  Administered 2016-01-01: 10 mL
  Administered 2016-01-02 – 2016-01-03 (×2): 20 mL
  Administered 2016-01-04: 10 mL
  Filled 2015-12-31 (×4): qty 40

## 2015-12-31 MED ORDER — CHLORHEXIDINE GLUCONATE CLOTH 2 % EX PADS
6.0000 | MEDICATED_PAD | Freq: Every day | CUTANEOUS | Status: AC
  Administered 2015-12-31 – 2016-01-04 (×5): 6 via TOPICAL

## 2015-12-31 MED ORDER — GUAIFENESIN 100 MG/5ML PO SOLN
600.0000 mg | Freq: Four times a day (QID) | ORAL | Status: DC
  Administered 2015-12-31 – 2016-01-02 (×9): 600 mg via ORAL
  Filled 2015-12-31 (×23): qty 30

## 2015-12-31 MED ORDER — ONDANSETRON HCL 4 MG PO TABS: 4.0000 mg | ORAL_TABLET | Freq: Four times a day (QID) | ORAL | Status: DC | PRN

## 2015-12-31 MED ORDER — SODIUM CHLORIDE 0.9 % IV SOLN: Freq: Once | INTRAVENOUS | Status: DC

## 2015-12-31 MED ORDER — POLYETHYLENE GLYCOL 3350 17 G PO PACK
17.0000 g | PACK | Freq: Two times a day (BID) | ORAL | Status: DC
  Administered 2016-01-01: 17 g via ORAL
  Filled 2015-12-31 (×6): qty 1

## 2015-12-31 MED ORDER — DOXEPIN HCL 25 MG PO CAPS
25.0000 mg | ORAL_CAPSULE | Freq: Four times a day (QID) | ORAL | Status: DC
  Administered 2015-12-31 – 2016-01-02 (×8): 25 mg via ORAL
  Filled 2015-12-31 (×22): qty 1

## 2015-12-31 MED ORDER — ENOXAPARIN SODIUM 40 MG/0.4ML ~~LOC~~ SOLN
40.0000 mg | SUBCUTANEOUS | Status: DC
  Administered 2015-12-31 – 2016-01-05 (×5): 40 mg via SUBCUTANEOUS
  Filled 2015-12-31 (×6): qty 0.4

## 2015-12-31 MED ORDER — SODIUM CHLORIDE 0.9% FLUSH
3.0000 mL | Freq: Two times a day (BID) | INTRAVENOUS | Status: DC
  Administered 2015-12-31 – 2016-01-03 (×5): 3 mL via INTRAVENOUS

## 2015-12-31 MED ORDER — ACETAMINOPHEN 325 MG PO TABS
650.0000 mg | ORAL_TABLET | Freq: Four times a day (QID) | ORAL | Status: DC | PRN
  Administered 2015-12-31 – 2016-01-01 (×5): 650 mg via ORAL
  Filled 2015-12-31 (×5): qty 2

## 2015-12-31 MED ORDER — ACETAMINOPHEN 650 MG RE SUPP: 650.0000 mg | Freq: Four times a day (QID) | RECTAL | Status: DC | PRN

## 2015-12-31 MED ORDER — SODIUM CHLORIDE 0.9 % IV SOLN
Freq: Once | INTRAVENOUS | Status: AC
  Administered 2015-12-31: 03:00:00 via INTRAVENOUS

## 2015-12-31 MED ORDER — POLYETHYLENE GLYCOL 3350 17 G PO PACK: 17.0000 g | PACK | Freq: Two times a day (BID) | ORAL | Status: DC

## 2015-12-31 MED ORDER — SUCRALFATE 1 GM/10ML PO SUSP
1.0000 g | Freq: Three times a day (TID) | ORAL | Status: DC
  Administered 2015-12-31 – 2016-01-05 (×16): 1 g via ORAL
  Filled 2015-12-31 (×21): qty 10

## 2015-12-31 MED ORDER — MUPIROCIN 2 % EX OINT
1.0000 "application " | TOPICAL_OINTMENT | Freq: Two times a day (BID) | CUTANEOUS | Status: AC
  Administered 2015-12-31 – 2016-01-04 (×10): 1 via NASAL
  Filled 2015-12-31: qty 22

## 2015-12-31 MED ORDER — SODIUM CHLORIDE 0.9 % IV BOLUS (SEPSIS)
500.0000 mL | Freq: Once | INTRAVENOUS | Status: AC
  Administered 2015-12-31: 500 mL via INTRAVENOUS

## 2015-12-31 MED ORDER — CETYLPYRIDINIUM CHLORIDE 0.05 % MT LIQD
7.0000 mL | Freq: Two times a day (BID) | OROMUCOSAL | Status: DC
  Administered 2015-12-31 – 2016-01-05 (×8): 7 mL via OROMUCOSAL

## 2015-12-31 MED ORDER — SUCRALFATE 1 G PO TABS
1.0000 g | ORAL_TABLET | Freq: Three times a day (TID) | ORAL | Status: DC
  Administered 2015-12-31 (×2): 1 g via ORAL
  Filled 2015-12-31 (×5): qty 1

## 2015-12-31 NOTE — Progress Notes (Signed)
Debra Farrell   DOB:[birth year]   OZ#:[id number]    Subjective: The patient was admitted to the hospital due to uncontrolled fever and confusion. With the confusion, they took off her fentanyl patch and she received Toradol for pain. The patient has significant mucus production and coughing. At the emergency room, they ordered a CT scan which rule out recurrent abscess. Incidentally,the left lower lobe nodule is enlarging, suspicious for metastatic cancer. At the time of evaluation today, she appears quite sedated. She is quite distressed with significant mucus production and coughing. Tube feeding has not been resumed. The patient continues to have intermittent fever but no further confusion.   Objective:  Filed Vitals:   12/31/15 1252 12/31/15 1400  BP:  123/55  Pulse:  81  Temp: 100 F (37.8 C) 99.1 F (37.3 C)  Resp:  18     Intake/Output Summary (Last 24 hours) at 12/31/15 1440 Last data filed at 12/31/15 0855  Gross per 24 hour  Intake   1132 ml  Output    350 ml  Net    782 ml    GENERAL:alert, no distress and comfortable, she appears to be sedated  SKIN:Significant skin breakdown around her neck. No bleeding  EYES: normal, Conjunctiva arepale  and non-injected, sclera clear OROPHARYNX:no exudate, no erythema and lips, buccal mucosa, and tongue normal  NECK: supple, thyroid normal size, non-tender, without nodularity LYMPH: Persistent lymphadenopathy noted in the neck.  LUNGS: clear to auscultation and percussion with normal breathing effort HEART: regular rate & rhythm and no murmurs and no lower extremity edema ABDOMEN:abdomen soft, non-tender and normal bowel sounds. No leak around the feeding tube  Musculoskeletal:no cyanosis of digits and no clubbing  NEURO: alert & oriented x 3 with fluent speech, no focal motor/sensory deficits   Labs:  Lab Results  Component Value Date   WBC 5.1 12/31/2015   HGB 8.8* 12/31/2015   HCT 25.6* 12/31/2015   MCV 68.4* 12/31/2015   PLT  212 12/31/2015   NEUTROABS 8.2* 12/30/2015    Lab Results  Component Value Date   NA 135 12/31/2015   K 3.6 12/31/2015   CL 102 12/31/2015   CO2 25 12/31/2015   I reviewed the CT scan with the patient and her daughter  Studies:  Dg Chest 2 View  12/30/2015  CLINICAL DATA:  Lethargic.  Near syncope.  Confusion. EXAM: CHEST  2 VIEW COMPARISON:  11/22/2015 FINDINGS: There is a right jugular Port-A-Cath with tip in the SVC just below the azygos vein junction. The lungs are clear. There is no effusion. Hilar and mediastinal contours are unremarkable and unchanged. Heart size is normal, unchanged. IMPRESSION: No active cardiopulmonary disease. Electronically Signed   By: Ellery Plunk M.D.   On: 12/30/2015 19:59   Ct Abdomen Pelvis W Contrast  12/31/2015  CLINICAL DATA:  New onset syncope.  Worsening weakness.  Sepsis. EXAM: CT ABDOMEN AND PELVIS WITH CONTRAST TECHNIQUE: Multidetector CT imaging of the abdomen and pelvis was performed using the standard protocol following bolus administration of intravenous contrast. CONTRAST:  ISOVUE-300 IOPAMIDOL (ISOVUE-300) INJECTION 61% COMPARISON:  12/08/2015 FINDINGS: Lower chest: Left lower lobe pulmonary nodule is now 13 mm, enlarged from 12/08/2015 when it measured 8 mm. Mild atelectatic appearing linear basilar opacities are present bilaterally. Hepatobiliary: Liver appears normal. Bile ducts are normal. Multiple calculi within the gallbladder. No gallbladder wall thickening or pericholecystic edema. Pancreas: Normal Spleen: Normal, with sub cm focal hypodensity which is unchanged. Adrenals/Urinary Tract: The adrenals and  kidneys are normal in appearance with the exception of an unchanged 10 mm hypodensity in the right renal midpole, likely a cyst. There is no urinary calculus evident. There is no hydronephrosis or ureteral dilatation. Collecting systems and ureters appear unremarkable. Stomach/Bowel: Percutaneous gastrostomy tube. There are normal  appearances of the stomach, small bowel and colon. The appendix is normal. Vascular/Lymphatic: The abdominal aorta is normal in caliber. There is mild atherosclerotic calcification. There is no adenopathy in the abdomen or pelvis. Reproductive: Hysterectomy.  No adnexal abnormalities. Other: The epigastric drain has been removed. No recurrent abscess. No focal inflammatory changes in the abdomen or pelvis. No ascites. Musculoskeletal: No significant skeletal lesions. IMPRESSION: 1. Enlarging left lower lobe pulmonary nodule. This could represent a hematogenous metastasis although the rate of enlargement is faster than typical. 2. Complete resolution of the anterior abdominal abscess. The drainage catheter has been removed and there is no evidence of recurrent or residual collection. 3. No inflammatory changes are evident in the abdomen or pelvis. No adenopathy. No ascites. 4. Cholelithiasis. Electronically Signed   By: Ellery Plunk M.D.   On: 12/31/2015 00:41    Assessment & Plan:   Cancer of base of tongue (HCC) The patient has experienced significant decline since the last time I saw her. Continue aggressive supportive care. I would defer to the radiation oncologist to determine further radiation therapy  Throat pain in adult She has severe, uncontrolled throat pain for several days. We discussed pain management. She has poor tolerance to treatment. With fentanyl patch, her daughter felt that it could be contributing to the excessive sedation and confusion. After much discussion, she is in agreement to consult palliative care for assistance in pain management.  Constipation due to opioid therapy, resolved She has severe, uncontrolled constipation for 4 days, subsequently resolved with laxatives. Continue laxative management.  Intractable nausea and vomiting Mucus gagging She has severe intractable nausea and vomiting likely multifactorial from mucous secretion, constipation and delayed  gastric clearance from nutritional supplements. I recommend IV fluid hydration therapy and IV antiemetics on a regular basis.  Severe protein-calorie malnutrition (HCC) She is not able to tolerate her nutritional supplement due to uncontrolled nausea and vomiting. 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 She is using topical emollient cream.  Intermittent confusion Could be related to side effects of pain medicine or sepsis She is more alert now compared to last night  Recent MRSA infection, recurrent fever I agree with broad-spectrum IV antibiotics. Cultures are pending.  Enlarging lung nodule Worrisome for metastatic cancer  CODE STATUS Goals of care I spoke with her daughter, [name], regarding goals of care and CODE STATUS. The patient has verbalized the desire for DO NOT RESUSCITATE last night but her daughter felt that it could be related to her confusion Having said that, she felt that her mother is suffering from multiple side effects With evidence of possible metastatic cancer to the lung, she felt that palliative care consult would be available to help them address goals of care and symptom management and I agree to place referral for palliative care to visit with the patient and family to address their concerns  Discharge planning The patient is not ready for discharge due to unresolving issues I will continue to follow   [facility], Nanci Lakatos, MD 12/31/2015  2:40 PM

## 2015-12-31 NOTE — Care Management Note (Signed)
Case Management Note  Patient Details  Name: Debra Farrell MRN: [id number] Date of Birth: 1946-08-25  Subjective/Objective:   70 y/o f admitted w/SIRS. Readmit. From home. Used [facility] in past-recent d/c from their service on 3/24-HHRN-iv abx, TF. PT cons placed. If [facility] needed @ d/c plese order.                 Action/Plan:d/c plan home.   Expected Discharge Date:                  Expected Discharge Plan:  [place]  In-House Referral:     Discharge planning Services  CM Consult  Post Acute Care Choice:    Choice offered to:     DME Arranged:    DME Agency:     HH Arranged:    [place] Agency:     Status of Service:  In process, will continue to follow  Medicare Important Message Given:    Date Medicare IM Given:    Medicare IM give by:    Date Additional Medicare IM Given:    Additional Medicare Important Message give by:     If discussed at [place] of Stay Meetings, dates discussed:    Additional Comments:  Dessa Phi, RN 12/31/2015, 12:16 PM

## 2015-12-31 NOTE — Progress Notes (Addendum)
Pt temp 100.5 when checking pre-blood vitals. Paged on call, told to administer blood, per K. Schorr.

## 2015-12-31 NOTE — Progress Notes (Signed)
Addendum to my note:  Severe anemia This is due to anemia of chronic disease She has received multiple units of blood transfusion Continue to monitor closely

## 2015-12-31 NOTE — Progress Notes (Signed)
Pt NT suctioned per request with large amounts of pale yellow secretions.

## 2015-12-31 NOTE — Progress Notes (Signed)
Initial Nutrition Assessment  DOCUMENTATION CODES:   Not applicable  INTERVENTION:  - Will order Osmolite 1.5 @ 30 mL/hr from 1900-0700 and 1 can Osmolite 1.5 bolus TID (at 1000, 1300, 1600). This regimen will provide 1606 kcal (77.5% minimum estimated kcal needs, 68 grams of protein (66% minimum estimated protein needs), 817 mL free water. - Will change rate of continuous TF on 4/7 if well tolerated over night tonight - Will monitor IVF and determine needs for additional free water flushes with changes of IVF regimen - RD will continue to monitor for nutrition-related needs and interventions  NUTRITION DIAGNOSIS:   Increased nutrient needs related to catabolic illness, cancer and cancer related treatments as evidenced by estimated needs.  GOAL:   Patient will meet greater than or equal to 90% of their needs  MONITOR:   TF tolerance, Weight trends, Labs, Skin, I & O's  REASON FOR ASSESSMENT:   Malnutrition Screening Tool, Consult Enteral/tube feeding initiation and management  ASSESSMENT:   70 y.o. female has a past medical history of Hypertension; Hyperlipidemia; Eczema; Thalassemia minor; Cancer of base of tongue ([place]) (10/14/2015); Laceration (04/2014); Intractable nausea and vomiting (11/13/2015); Abdominal abscess ([place]) (11/30/2015); Infection with methicillin-resistant Staphylococcus aureus (MRSA) (11/30/2015); Nausea without vomiting (12/16/2015); Anemia in chronic illness (12/16/2015); and Throat pain in adult (12/29/2015). Presented with febrile episode after receiving Dulcolax suppository. For the past few days patient had had severe mucositis throat and mouth pain difficulty swallowing nausea and vomiting and have had severe constipation for past 5 days. Patient has not been able to eat well and not able to tolerate her nutritional supplements. She has nutritional supplement through feeding pump but haven't been able to keep that down. She was seen at [place] today was noted to  have hemoglobin of 7.3. On febrile up to 101.3  Pt seen for MST and consult for TF. BMI indicates normal weight/borderline overweight. Pt with PEG and was last seen by [place] on 12/29/15 with the following plan at that time:  Will change tube feeding regimen to combination of continuous feedings and bolus feedings. Patient will use Osmolite 1.5 at 60 mL an hour for 12 hours daily from 7 PM to 7 AM. Patient will give 1 can bolus feedings at 9 AM, 12 PM in 3 PM. Patient will continue free water flushes of 60 cc free water before and after bolus feeding and continuous feeding. Patient should flush feeding tube with an additional 600 cc free water daily to meet minimum free water needs.  Spoke with pt's daughter at bedside as Therapist, sports and Presenter, broadcasting were working with pt. Daughter provides all information. TF pump was received yesterday but plan per [facility] RD was unable to be started due to need for admission. Daughter is interested in starting this TF regimen during admission in order to assess tolerance. Previously, pt was tolerating 2 cans Osmolite 1.5 TID but was having excessive fullness and delayed gastric emptying which was causing associated N/V; Reglan was added and was helping. Daughter reports that mucositis has been ongoing for ~1 week but has worsened since 12/25/15 with pt unable to tolerate even water. Due to this all medications were being given via PEG per pt preference. MD alerted to this.   Will initiate TF regimen as outlined in Interventions section and will follow-up 4/7. Unable to complete physical assessment during this visit but will do so tomorrow is able. Daughter reports that pt's weight has been trending up with TF and that pt's  strength has been increasing; daughter very interested in ensuring nutrition needs are met as quickly as possible. Per chart review, weight since 12/09/15 has been fluctuating between 146-152 lbs; will continue to closely monitor weight trends during  admission.   Not currently meeting needs. Medications reviewed; 10 mg Reglan TID, 1 mg Carafate TID. IVF: NS @ 75 mL/hr. Labs reviewed; BUN: 22 mg/dL, Ca: 8.5 mg/dL, Mg: 1.5 mg/day.   Diet Order:  Diet NPO time specified Except for: Ice Chips  Skin:  Reviewed, no issues  Last BM:  4/6  Height:   Ht Readings from Last 1 Encounters:  12/31/15 '5\' 6"'  (1.676 m)    Weight:   Wt Readings from Last 1 Encounters:  12/31/15 152 lb (68.947 kg)    Ideal Body Weight:  59.09 kg (kg)  BMI:  Body mass index is 24.55 kg/(m^2).  Estimated Nutritional Needs:   Kcal:  2070-2275 (30-33 kcal/kg)  Protein:  103-117 grams (1.5-1.7 grams/kg)  Fluid:  >/= 2 L/day  EDUCATION NEEDS:   No education needs identified at this time     Jarome Matin, RD, LDN Inpatient Clinical Dietitian Pager # [phone] After hours/weekend pager # [phone]

## 2015-12-31 NOTE — Progress Notes (Signed)
Patient seen and examined, full note to follow Met with patient and daughter at bedside.  Try doxepin swish and spit solution for mucositis.  Continue with suctioning secretions for now, will need robinul iv if secretions uncontrolled, avoid scopolamine patch.  Add back fentanyl patch. Continue current pain meds  Patient and daughter struggling with code status discussions. Acknowledged their situation. Will continue conversations on 4-7.  Thank you for the consult.  Dortha Neighbors md [phone]  [facility] palliative medicine team

## 2015-12-31 NOTE — Progress Notes (Signed)
[facility] PROGRESS NOTE  Debra Farrell IHK:[id number] DOB: 17-Aug-1946 DOA: 12/30/2015 PCP: Rodrigo Ran, MD  Assessment/Plan: #1 systemic inflammatory response syndrome Patient presented with fevers, odynophagia, noted to have a heart rate up to 100 with a respiratory rate of 32. Etiology of patient's fever is unknown. Chest x-ray was negative for any acute infiltrate. CT abdomen and pelvis with no source of infection noted with resolution of abscess. Urinalysis was nitrite negative leukocytes negative. Patient has been pancultured and results pending. Continue empiric IV vancomycin and IV Zosyn. Follow.  #2 odynophagia secondary to mucositis secondary to radiation Patient with a mucositis the into difficulty swallowing and decreased oral intake leading to dehydration. Patient seems congested with secretions noted. Will get respiratory therapy for suctioning. We'll place on Magic mouthwash with lidocaine 4 times daily as well as sucralfate.  #3 dehydration Secondary to poor oral intake secondary to mucositis. IV fluids. Supportive care.  #4 severe anemia Likely secondary to chronic disease secondary to cancer. Patient status post transfusion of 3 units packed red blood cells.   posttransfusion H&H with a hemoglobin of 8.8. Patient with no overt bleeding. Follow and transfuse for hemoglobin less than 7.  #5 hypomagnesemia Replete.  #6 constipation Opoid induced. Patient stated had large bowel movements yesterday and today. Continue current bowel regimen.  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 Per oncology.  #8 hypertension Stable. Continue to hold antihypertensive medications.  #9 prophylaxis SCDs for DVT prophylaxis.  Code Status: Full Family Communication: Updated patient and daughter at bedside. Disposition Plan: Home when medically stable.   Consultants:  Oncology  Procedures:  CT abdomen and pelvis 12/31/2015  Chest x-ray 12/30/2015  3  units packed red blood cells 12/30/2015--12/31/2015  Antibiotics:  IV vancomycin 12/30/2015  IV Zosyn 12/30/2015  HPI/Subjective: Patient complaining of pain on swallowing. Patient denies any shortness of breath. Patient did have some episode of emesis prior to admission. Patient stated had a decent sized bowel movement yesterday and today.  Objective: Filed Vitals:   12/31/15 1252 12/31/15 1400  BP:  123/55  Pulse:  81  Temp: 100 F (37.8 C) 99.1 F (37.3 C)  Resp:  18    Intake/Output Summary (Last 24 hours) at 12/31/15 1850 Last data filed at 12/31/15 0855  Gross per 24 hour  Intake   1132 ml  Output    350 ml  Net    782 ml   Filed Weights   12/31/15 0111  Weight: 68.947 kg (152 lb)    Exam:   General:  NAD. alert sitting up in bed   Cardiovascular: RRR  Respiratory: CTAB  Abdomen: Soft, nontender, nondistended, positive bowel sounds. PEG tube in place.   Musculoskeletal: No clubbing cyanosis or edema.  Data Reviewed: Basic Metabolic Panel:  Recent Labs Lab 12/30/15 0947 12/30/15 2007 12/31/15 0130  NA 132* 132* 135  K 4.3 3.9 3.6  CL  --  98* 102  CO2 29 25 25   GLUCOSE 160* 126* 114*  BUN 26.0 26* 22*  CREATININE 1.0 0.93 0.90  CALCIUM 9.4 8.7* 8.5*  MG 1.6  --  1.5*  PHOS  --   --  3.0   Liver Function Tests:  Recent Labs Lab 12/30/15 0947 12/30/15 2007 12/31/15 0130  AST 35* 31 27  ALT 38 36 31  ALKPHOS 98 87 73  BILITOT 0.80 1.2 1.2  PROT 6.7 6.3* 5.7*  ALBUMIN 3.1* 3.1* 2.7*   No results for input(s): LIPASE, AMYLASE  in the last 168 hours. No results for input(s): AMMONIA in the last 168 hours. CBC:  Recent Labs Lab 12/30/15 0947 12/30/15 2007 12/30/15 2332 12/31/15 0130 12/31/15 1230  WBC 7.0 9.7 5.8 5.1  --   NEUTROABS 6.3 8.2*  --   --   --   HGB 7.3* 5.7* 7.1* 7.1* 8.8*  HCT 23.0* 17.1* 21.5* 21.2* 25.6*  MCV 65.1* 68.4* 68.7* 68.4*  --   PLT 274 289 219 212  --    Cardiac Enzymes: No results for input(s):  CKTOTAL, CKMB, CKMBINDEX, TROPONINI in the last 168 hours. BNP (last 3 results) No results for input(s): BNP in the last 8760 hours.  ProBNP (last 3 results) No results for input(s): PROBNP in the last 8760 hours.  CBG:  Recent Labs Lab 12/30/15 1934  GLUCAP 112*    Recent Results (from the past 240 hour(s))  MRSA PCR Screening     Status: Abnormal   Collection Time: 12/31/15  1:26 AM  Result Value Ref Range Status   MRSA by PCR POSITIVE (A) NEGATIVE Final    Comment:        The GeneXpert MRSA Assay (FDA approved for NASAL specimens only), is one component of a comprehensive MRSA colonization surveillance program. It is not intended to diagnose MRSA infection nor to guide or monitor treatment for MRSA infections. RESULT CALLED TO, READ BACK BY AND VERIFIED WITH: K.DION,RN AT 0413 ON 12/31/15 BY W.SHEA      Studies: Dg Chest 2 View  12/30/2015  CLINICAL DATA:  Lethargic.  Near syncope.  Confusion. EXAM: CHEST  2 VIEW COMPARISON:  11/22/2015 FINDINGS: There is a right jugular Port-A-Cath with tip in the SVC just below the azygos vein junction. The lungs are clear. There is no effusion. Hilar and mediastinal contours are unremarkable and unchanged. Heart size is normal, unchanged. IMPRESSION: No active cardiopulmonary disease. Electronically Signed   By: Ellery Plunk M.D.   On: 12/30/2015 19:59   Ct Abdomen Pelvis W Contrast  12/31/2015  CLINICAL DATA:  New onset syncope.  Worsening weakness.  Sepsis. EXAM: CT ABDOMEN AND PELVIS WITH CONTRAST TECHNIQUE: Multidetector CT imaging of the abdomen and pelvis was performed using the standard protocol following bolus administration of intravenous contrast. CONTRAST:  ISOVUE-300 IOPAMIDOL (ISOVUE-300) INJECTION 61% COMPARISON:  12/08/2015 FINDINGS: Lower chest: Left lower lobe pulmonary nodule is now 13 mm, enlarged from 12/08/2015 when it measured 8 mm. Mild atelectatic appearing linear basilar opacities are present bilaterally.  Hepatobiliary: Liver appears normal. Bile ducts are normal. Multiple calculi within the gallbladder. No gallbladder wall thickening or pericholecystic edema. Pancreas: Normal Spleen: Normal, with sub cm focal hypodensity which is unchanged. Adrenals/Urinary Tract: The adrenals and kidneys are normal in appearance with the exception of an unchanged 10 mm hypodensity in the right renal midpole, likely a cyst. There is no urinary calculus evident. There is no hydronephrosis or ureteral dilatation. Collecting systems and ureters appear unremarkable. Stomach/Bowel: Percutaneous gastrostomy tube. There are normal appearances of the stomach, small bowel and colon. The appendix is normal. Vascular/Lymphatic: The abdominal aorta is normal in caliber. There is mild atherosclerotic calcification. There is no adenopathy in the abdomen or pelvis. Reproductive: Hysterectomy.  No adnexal abnormalities. Other: The epigastric drain has been removed. No recurrent abscess. No focal inflammatory changes in the abdomen or pelvis. No ascites. Musculoskeletal: No significant skeletal lesions. IMPRESSION: 1. Enlarging left lower lobe pulmonary nodule. This could represent a hematogenous metastasis although the rate of enlargement is faster than  typical. 2. Complete resolution of the anterior abdominal abscess. The drainage catheter has been removed and there is no evidence of recurrent or residual collection. 3. No inflammatory changes are evident in the abdomen or pelvis. No adenopathy. No ascites. 4. Cholelithiasis. Electronically Signed   By: Ellery Plunk M.D.   On: 12/31/2015 00:41    Scheduled Meds: . sodium chloride   Intravenous Once  . acetaminophen  650 mg Oral Once  . antiseptic oral rinse  7 mL Mouth Rinse q12n4p  . Chlorhexidine Gluconate Cloth  6 each Topical Q0600  . doxepin  25 mg Oral 4 times per day  . enoxaparin (LOVENOX) injection  40 mg Subcutaneous Q24H  . feeding supplement (OSMOLITE 1.5 CAL)  1,000 mL  Per Tube Q24H  . feeding supplement (OSMOLITE 1.5 CAL)  237 mL Per Tube TID  . fentaNYL  25 mcg Transdermal Q72H  . guaiFENesin  600 mg Oral 4 times per day  . magic mouthwash w/lidocaine  15 mL Oral QID  . metoCLOPramide  10 mg Oral TID AC & HS  . mupirocin ointment  1 application Nasal BID  . piperacillin-tazobactam (ZOSYN)  IV  3.375 g Intravenous Q8H  . polyethylene glycol  17 g Oral BID  . senna-docusate  1 tablet Oral BID  . sodium chloride flush  10-40 mL Intracatheter Q12H  . sodium chloride flush  3 mL Intravenous Q12H  . sucralfate  1 g Oral TID WC & HS  . vancomycin  500 mg Intravenous Q12H   Continuous Infusions: . 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 [facility] Pager [phone]. If 7PM-7AM, please contact night-coverage at [email or web address], password [email or web address] 12/31/2015, 6:50 PM  LOS: 1 day

## 2015-12-31 NOTE — Progress Notes (Signed)
MD notified about temp of 102.1 and runs of SVT. Tylenol given  Barbee Shropshire. Brigitte Pulse, RN

## 2015-12-31 NOTE — Progress Notes (Signed)
Patient refused soap suds enema. States she had liquid BM this morning. Patient also refuses po mucinex, states she cannot swallow pill. Patient's radiation was canceled, patient and daughter verbalized that they wanted radiation and it was re-scheduled for 4pm, however patient refused radiation at 4pm due to it being late in the day.  Barbee Shropshire. Brigitte Pulse, RN

## 2015-12-31 NOTE — Consult Note (Signed)
[facility] CM Inpatient Consult   12/31/2015  Debra Farrell 1946/07/14 [id number]   Went to bedside to speak with patient on behalf of [employer] to [employer] program for [employer] employees/dependents with [employer]. Currently not a good time to speak with her. Spoke with patient's nurse. Spoke with inpatient RNCM as well. Will continue to follow and make bedside visit at a more appropriate time. Chart reviewed. Also noted palliative consult pending.   Raiford Noble, MSN-Ed, RN,BSN [facility] Liaison [phone]

## 2016-01-01 ENCOUNTER — Ambulatory Visit: Payer: 59

## 2016-01-01 ENCOUNTER — Encounter: Payer: Self-pay | Admitting: Nutrition

## 2016-01-01 ENCOUNTER — Ambulatory Visit: Payer: Self-pay

## 2016-01-01 ENCOUNTER — Ambulatory Visit
Admit: 2016-01-01 | Discharge: 2016-01-01 | Disposition: A | Discharge status: Home / Self Care | Payer: 59 | Attending: Radiation Oncology | Admitting: Radiation Oncology

## 2016-01-01 ENCOUNTER — Telehealth: Payer: Self-pay | Admitting: *Deleted

## 2016-01-01 DIAGNOSIS — Z7189 Other specified counseling: Secondary | ICD-10-CM

## 2016-01-01 DIAGNOSIS — R918 Other nonspecific abnormal finding of lung field: Secondary | ICD-10-CM

## 2016-01-01 DIAGNOSIS — E876 Hypokalemia: Secondary | ICD-10-CM

## 2016-01-01 LAB — BASIC METABOLIC PANEL
Anion gap: 9 (ref 5–15)
BUN: 18 mg/dL (ref 6–20)
CHLORIDE: 99 mmol/L — AB (ref 101–111)
CO2: 26 mmol/L (ref 22–32)
CREATININE: 0.83 mg/dL (ref 0.44–1.00)
Calcium: 8.3 mg/dL — ABNORMAL LOW (ref 8.9–10.3)
GFR calc Af Amer: >60 mL/min (ref >60)
GFR calc non Af Amer: >60 mL/min (ref >60)
Glucose, Bld: 122 mg/dL — ABNORMAL HIGH (ref 65–99)
POTASSIUM: 2.8 mmol/L — AB (ref 3.5–5.1)
Sodium: 134 mmol/L — ABNORMAL LOW (ref 135–145)

## 2016-01-01 LAB — URINE CULTURE: Culture: 1000 — AB

## 2016-01-01 LAB — CBC WITH DIFFERENTIAL/PLATELET
BASOS PCT: 0 %
Basophils Absolute: 0 10*3/uL (ref 0.0–0.1)
EOS PCT: 3 %
Eosinophils Absolute: 0.1 10*3/uL (ref 0.0–0.7)
HEMATOCRIT: 26.7 % — AB (ref 36.0–46.0)
HEMOGLOBIN: 9 g/dL — AB (ref 12.0–15.0)
LYMPHS PCT: 5 %
Lymphs Abs: 0.2 10*3/uL — ABNORMAL LOW (ref 0.7–4.0)
MCH: 23.6 pg — AB (ref 26.0–34.0)
MCHC: 33.7 g/dL (ref 30.0–36.0)
MCV: 70.1 fL — AB (ref 78.0–100.0)
MONOS PCT: 15 %
Monocytes Absolute: 0.6 10*3/uL (ref 0.1–1.0)
NEUTROS ABS: 2.9 10*3/uL (ref 1.7–7.7)
NEUTROS PCT: 77 %
Platelets: 222 10*3/uL (ref 150–400)
RBC: 3.81 MIL/uL — ABNORMAL LOW (ref 3.87–5.11)
RDW: 23.5 % — ABNORMAL HIGH (ref 11.5–15.5)
WBC: 3.8 10*3/uL — ABNORMAL LOW (ref 4.0–10.5)

## 2016-01-01 LAB — TYPE AND SCREEN
ABO/RH(D): O POS
Antibody Screen: NEGATIVE
Unit division: 0
Unit division: 0

## 2016-01-01 LAB — GLUCOSE, CAPILLARY
GLUCOSE-CAPILLARY: 100 mg/dL — AB (ref 65–99)
GLUCOSE-CAPILLARY: 111 mg/dL — AB (ref 65–99)
Glucose-Capillary: 104 mg/dL — ABNORMAL HIGH (ref 65–99)
Glucose-Capillary: 151 mg/dL — ABNORMAL HIGH (ref 65–99)
Glucose-Capillary: 152 mg/dL — ABNORMAL HIGH (ref 65–99)
Glucose-Capillary: 152 mg/dL — ABNORMAL HIGH (ref 65–99)

## 2016-01-01 LAB — HAPTOGLOBIN: Haptoglobin: 302 mg/dL — ABNORMAL HIGH (ref 34–200)

## 2016-01-01 LAB — MAGNESIUM: MAGNESIUM: 1.9 mg/dL (ref 1.7–2.4)

## 2016-01-01 MED ORDER — SODIUM CHLORIDE 0.9 % IV SOLN
8.0000 mg | Freq: Three times a day (TID) | INTRAVENOUS | Status: DC
  Administered 2016-01-01 – 2016-01-05 (×14): 8 mg via INTRAVENOUS
  Filled 2016-01-01 (×15): qty 4

## 2016-01-01 MED ORDER — FENTANYL 25 MCG/HR TD PT72
37.5000 ug | MEDICATED_PATCH | TRANSDERMAL | Status: DC
  Administered 2016-01-01 – 2016-01-04 (×2): 37.5 ug via TRANSDERMAL
  Filled 2016-01-01 (×2): qty 1

## 2016-01-01 MED ORDER — OSMOLITE 1.5 CAL PO LIQD
1000.0000 mL | ORAL | Status: DC
  Administered 2016-01-01 – 2016-01-02 (×2): 1000 mL
  Filled 2016-01-01 (×3): qty 1000

## 2016-01-01 MED ORDER — HYDROCERIN EX CREA
TOPICAL_CREAM | CUTANEOUS | Status: DC | PRN
  Filled 2016-01-01: qty 113

## 2016-01-01 MED ORDER — POTASSIUM CHLORIDE 20 MEQ/15ML (10%) PO SOLN
40.0000 meq | ORAL | Status: AC
  Administered 2016-01-01 (×3): 40 meq
  Filled 2016-01-01 (×5): qty 30

## 2016-01-01 NOTE — Telephone Encounter (Signed)
  Oncology Nurse Navigator Documentation  Navigator Location: [facility] Onc (01/01/16 1250) Navigator Encounter Type: Telephone (01/01/16 1250) Telephone: Outgoing Call;Patient Update (01/01/16 1250)                 Interventions: Coordination of Care (01/01/16 1250)       Called Ms. Tavano dtr, informed her that her mother's final 2 Tomo tmts will be conducted next week Monday and Tuesday to allow for resolution over the weekend of current copious secretions.  The plan is to also meet with Dr. Isidore Moos during Monday's WUT to discuss care post- XRT.  She voiced understanding.  Gayleen Orem, RN, BSN, [place] [facility] [place] [phone]                   Time Spent with Patient: 15 (01/01/16 1250)

## 2016-01-01 NOTE — Consult Note (Signed)
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 The patient is undergoing radiation treatments. The patient had a fever and was brought into the emergency department. She has had a severe uncontrolled throat pain for several days prior to this hospitalization. She also had nausea vomiting mucus gagging and constipation. She continues to have severe protein calorie malnutrition. She has a PEG tube. Patient also had intermittent confusion when she first came in. She has an enlarging lung nodule which is worrisome for metastatic cancer. She has received blood transfusions recently for severe anemia, anemia of chronic disease secondary to cancer.  Palliative care consultation for non-pain symptom management of mucositis, stomatitis, mouth pain and secretions management. Additionally also to build trust relationship and start engaging in goals of care discussions with the patient and her family.  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 She has 3 daughters. She is widowed. 2 of her daughters are also in the nursing profession. She is raising her teenage grand child.   Patient and family are aware of palliative and scope of palliative/supportive care services. Patient was initially seen on 12-31-15. She had extensive symptom burden from secretions. She is using suction at the bedside. She will also underwent  orotracheal extensive suctioning yesterday. She is having clear/milky secretions. No evidence of blood. Her constipation is resolved. She is on antibiotics.  Agree with fentanyl patch at increase the strength of 37.5 g change every 72 hours. Continue to titrate based on patient's pain needs. Additionally, doxepin swish and spit is being tried for management of stomatitis.   Engaged in code status discussions. Patient states that one of her daughters is struggling with the patient's declining condition. Discussed in detail about DNR DNI. At present, continue with full code.   Contacts/Participants in Discussion: Primary Decision Maker:     Relationship to Patient   HCPOA: no     SUMMARY OF RECOMMENDATIONS: Pain: continue with Dialudid IV PRN Secretions: continue with suction, avoid anti cholinergics like scopolamine patch, avoid delirium, patient's goals are to be as awake/alert as possible.  Stomatitis: continue with doxepin swish and spit.  Continue PEG bolus feeds for now.  Continue to gently engage in GOC discussions.    Code Status/Advance Care Planning: Full code    Code Status Orders        Start     Ordered   12/31/15 0057  Full code   Continuous     12/31/15 0057    Code Status History    Date Active Date Inactive Code Status Order ID Comments User Context   11/13/2015  1:10 PM 11/30/2015  2:21 PM Full Code [id number]  Artis Delay, MD Inpatient   11/05/2015  2:49 PM 11/06/2015  3:24 AM Full Code [id number]  Oley Balm, MD HOV   11/05/2015  2:49 PM 11/05/2015  2:49 PM Full Code [id number]  Oley Balm, MD [facility]    Advance Directive Documentation  Most Recent Value   Type of Advance Directive  Healthcare Power of Attorney, Living will   Pre-existing out of facility DNR order (yellow form or pink MOST form)     "MOST" Form in Place?        Other Directives:None  Symptom Management:    As above  Palliative Prophylaxis:   Bowel Regimen to be changed to when  necessary instead of scheduled as the patient is having a lot of bowel movements today. Continue to monitor.   Psycho-social/Spiritual:  Support System: Fair Desire for further Chaplaincy support:no Additional Recommendations: Caregiving  Support/Resources  Prognosis: Unable to determine  Discharge Planning: Home with Home Health   Chief Complaint/ Primary Diagnoses: Present on Admission:  . Anemia due to other cause . Cancer of base of tongue (HCC) . Constipation due to opioid therapy . Dehydration . Essential hypertension . SIRS (systemic inflammatory response syndrome) (HCC) . Sepsis (HCC) . Encounter for palliative care . 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 The following aspects are pertinent.  Past Medical History  Diagnosis Date  . Hypertension   . Hyperlipidemia   . Eczema   . Thalassemia minor   . 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  . Laceration 04/2014    around R ear, for falling out of bed & hitting nightstand  . Intractable nausea and vomiting 11/13/2015  . Abdominal abscess (HCC) 11/30/2015  . Infection with methicillin-resistant Staphylococcus aureus (MRSA) 11/30/2015  . Nausea without vomiting 12/16/2015  . Anemia in chronic illness 12/16/2015  . Throat pain in adult 12/29/2015   Social History   Social History  . Marital Status: Unknown    Spouse Name: N/A  . Number of Children: 3  . Years of Education: N/A   Occupational History  .  [employer]   Social History Main Topics  . Smoking status: Never Smoker   . Smokeless tobacco: Never Used  . Alcohol Use: No  . Drug Use: No  . 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 None   Social History Narrative   The patient is widowed with 3 Daughters.    Moved to [facility] 2005.  RN at [employer].   Enjoys biking and walking.     Debra Farrell- Grandaughter- age 50- Has lived with [place] since birth.          Family History  Problem Relation Age of Onset  . Cancer Mother     lung  . Asthma Father    Scheduled Meds: . sodium chloride   Intravenous Once  . acetaminophen  650 mg Oral Once  . antiseptic oral rinse  7 mL Mouth Rinse q12n4p  . Chlorhexidine Gluconate Cloth  6 each Topical Q0600  . doxepin  25 mg Oral 4 times per day  . enoxaparin (LOVENOX) injection  40 mg Subcutaneous Q24H  . feeding supplement (OSMOLITE 1.5 CAL)  1,000 mL Per Tube Q24H  . feeding supplement (OSMOLITE 1.5 CAL)  237 mL Per Tube TID  . fentaNYL  37.5 mcg Transdermal Q72H  . guaiFENesin  600 mg Oral 4 times per day  . magic mouthwash w/lidocaine  15 mL Oral QID  . metoCLOPramide  10 mg Oral TID AC & HS  . mupirocin ointment  1 application Nasal BID  . ondansetron (ZOFRAN) IV  8 mg Intravenous 3 times per day  . piperacillin-tazobactam (ZOSYN)  IV  3.375 g Intravenous Q8H  . polyethylene glycol  17 g Oral BID  . potassium chloride  40 mEq Per Tube Q4H  . senna-docusate  1 tablet Oral BID  . sodium chloride flush  10-40 mL Intracatheter Q12H  . sodium chloride flush  3 mL Intravenous Q12H  . sucralfate  1 g Oral TID WC & HS  . vancomycin  500 mg Intravenous Q12H   Continuous Infusions: . sodium chloride 75 mL/hr at 01/01/16 1425   PRN Meds:.acetaminophen **OR** acetaminophen, bisacodyl, hydrocerin, HYDROmorphone (DILAUDID) injection, ondansetron **OR** ondansetron (ZOFRAN) IV, sodium chloride flush Medications Prior to Admission:  Prior to Admission medications   Medication Sig Start Date End Date Taking? Authorizing Provider  acetaminophen (TYLENOL) 325 MG tablet Take 650 mg by mouth every 6 (six) hours as needed for moderate pain.   Yes Historical Provider, MD  amoxicillin-clavulanate (AUGMENTIN) 875-125 MG tablet Take 1 tablet by mouth 2 (two) times daily. 12/30/15  Yes Artis Delay, MD  fentaNYL (DURAGESIC - DOSED MCG/HR) 25  MCG/HR patch Place 1 patch (25 mcg total) onto the skin every 3 (three) days. 12/29/15  Yes Artis Delay, MD  HYDROmorphone (DILAUDID) 4 MG tablet Take 1 tablet (4 mg total) by mouth every 4 (four) hours as needed for severe pain. 12/29/15  Yes Artis Delay, MD  lidocaine-prilocaine (EMLA) cream Apply to affected area once 11/10/15  Yes Artis Delay, MD  metoCLOPramide (REGLAN) 10 MG tablet Take 1 tablet (10 mg total) by mouth 4 (four) times daily -  before meals and at bedtime. 12/16/15  Yes Artis Delay, MD  Nutritional Supplements (FEEDING SUPPLEMENT, OSMOLITE 1.5 CAL,) LIQD Osmolite 1.5 at 60 mL for 12 hours from 7p-7a. 1 can Osmolite 1.5 TID at 9,12,3 with 60 mL free water before and after bolus and continuous feedings. with 240 cc free water once daily. Additional free water flush daily 12/29/15  Yes Artis Delay, MD  ondansetron (ZOFRAN) 8 MG tablet Take 1 tablet (8 mg total) by mouth 2 (two) times daily as needed. Start on the third day after chemotherapy. 11/10/15  Yes Artis Delay, MD  sucralfate (CARAFATE) 1 g tablet Dissolve 1 tablet in 10 mL H20 and swallow 30 min prior to meals and bedtime. Patient not taking: Reported on 12/29/2015 11/30/15   Lonie Peak, MD   Allergies  Allergen Reactions  . Morphine And Related Nausea And Vomiting    Review of Systems + for secretions, bowel movements.   Physical Exam Weak frail elderly lady sitting up in bed Increased upper respiratory secretions Radiation related skin changes to the top of the chest to S1-S2 Clear Abdomen soft nontender No edema Awake alert Is able to answer questions appropriately however is not able to talk for a long time because of secretions and pain in her mouth  Vital Signs: BP 182/76 mmHg  Pulse 105  Temp(Src) 100.3 F (37.9 C) (Oral)  Resp 20  Ht 5\' 6"  (1.676 m)  Wt 70.262 kg (154 lb 14.4 oz)  BMI 25.01 kg/m2  SpO2 92%  SpO2: SpO2: 92 % O2 Device:SpO2: 92 % O2 Flow Rate: .O2 Flow Rate (L/min): 2  L/min  IO: Intake/output summary:  Intake/Output Summary (Last 24 hours) at 01/01/16 1555 Last data filed at 01/01/16 1336  Gross per 24 hour  Intake 2708.75 ml  Output    320 ml  Net 2388.75 ml    LBM: Last BM  Date: 01/01/16 Baseline Weight: Weight: 68.947 kg (152 lb) Most recent weight: Weight: 70.262 kg (154 lb 14.4 oz)      Palliative Assessment/Data:  Flowsheet Rows        Most Recent Value   Intake Tab    Referral Department  Oncology   Unit at Time of Referral  Oncology Unit   Palliative Care Primary Diagnosis  Cancer   Date Notified  12/31/15   Palliative Care Type  New Palliative care   Reason for referral  Clarify Goals of Care, Non-pain Symptom   Date of Admission  12/30/15   Date first seen by Palliative Care  12/31/15   # of days Palliative referral response time  0 Day(s)   # of days IP prior to Palliative referral  1   Clinical Assessment    Palliative Performance Scale Score  30%   Pain Max last 24 hours  6   Pain Min Last 24 hours  5   Dyspnea Max Last 24 Hours  4   Dyspnea Min Last 24 hours  3   Psychosocial & Spiritual Assessment    Palliative Care Outcomes    Patient/Family meeting held?  Yes   Who was at the meeting?  patient, 2 daughters.    Palliative Care Outcomes  Clarified goals of care   Palliative Care follow-up planned  Yes, Home   Palliative Care Follow-up Reason  Non-pain symptom, Clarify goals of care      Additional Data Reviewed:  CBC:    Component Value Date/Time   WBC 3.8* 01/01/2016 0441   WBC 7.0 12/30/2015 0947   HGB 9.0* 01/01/2016 0441   HGB 7.3* 12/30/2015 0947   HCT 26.7* 01/01/2016 0441   HCT 23.0* 12/30/2015 0947   PLT 222 01/01/2016 0441   PLT 274 12/30/2015 0947   MCV 70.1* 01/01/2016 0441   MCV 65.1* 12/30/2015 0947   NEUTROABS 2.9 01/01/2016 0441   NEUTROABS 6.3 12/30/2015 0947   LYMPHSABS 0.2* 01/01/2016 0441   LYMPHSABS 0.1* 12/30/2015 0947   MONOABS 0.6 01/01/2016 0441   MONOABS 0.5 12/30/2015 0947    EOSABS 0.1 01/01/2016 0441   EOSABS 0.0 12/30/2015 0947   BASOSABS 0.0 01/01/2016 0441   BASOSABS 0.0 12/30/2015 0947   Comprehensive Metabolic Panel:    Component Value Date/Time   NA 134* 01/01/2016 0441   NA 132* 12/30/2015 0947   K 2.8* 01/01/2016 0441   K 4.3 12/30/2015 0947   CL 99* 01/01/2016 0441   CO2 26 01/01/2016 0441   CO2 29 12/30/2015 0947   BUN 18 01/01/2016 0441   BUN 26.0 12/30/2015 0947   CREATININE 0.83 01/01/2016 0441   CREATININE 1.0 12/30/2015 0947   CREATININE 1.04 04/07/2015 1536   GLUCOSE 122* 01/01/2016 0441   GLUCOSE 160* 12/30/2015 0947   CALCIUM 8.3* 01/01/2016 0441   CALCIUM 9.4 12/30/2015 0947   AST 27 12/31/2015 0130   AST 35* 12/30/2015 0947   ALT 31 12/31/2015 0130   ALT 38 12/30/2015 0947   ALKPHOS 73 12/31/2015 0130   ALKPHOS 98 12/30/2015 0947   BILITOT 1.2 12/31/2015 0130   BILITOT 0.80 12/30/2015 0947   PROT 5.7* 12/31/2015 0130   PROT 6.7 12/30/2015 0947   ALBUMIN 2.7* 12/31/2015 0130   ALBUMIN 3.1* 12/30/2015 0947     Time In:  1400 Time Out:  1530 Time Total:  90 min  Greater than 50%  of this time was spent counseling and coordinating care related to the above  assessment and plan.  Signed by: Rosalin Hawking, MD [phone]  Rosalin Hawking, MD  01/01/2016, 3:55 PM  Please contact Palliative Medicine Team phone at [phone] for questions and concerns.

## 2016-01-01 NOTE — Progress Notes (Signed)
Nutrition Follow-up  DOCUMENTATION CODES:   Not applicable  INTERVENTION:  - Will increase continuous TF to Osmolite 1.5 @ 45 mL/hr from 1900-0700 - Continue 1 can Osmolite 1.5 TID (1000,1300,1600) - Will continue to monitor for free water needs should current IVF regimen be adjusted - RD will continue to follow-up for TF-related needs  NUTRITION DIAGNOSIS:   Increased nutrient needs related to catabolic illness, cancer and cancer related treatments as evidenced by estimated needs. -ongoing  GOAL:   Patient will meet greater than or equal to 90% of their needs -will be met for kcal and unmet for protein with updated TF order  MONITOR:   TF tolerance, Weight trends, Labs, Skin, I & O's  ASSESSMENT:   70 y.o. 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 Presented with febrile episode after receiving Dulcolax suppository. For the past few days patient had had severe mucositis throat and mouth pain difficulty swallowing nausea and vomiting and have had severe constipation for past 5 days. Patient has not been able to eat well and not able to tolerate her nutritional supplements. She has nutritional supplement through feeding pump but haven't been able to keep that down. She was seen at [facility] today was noted to have hemoglobin of 7.3. On febrile up to 101.3  4/7 Pt sleeping at time of RD visit and no family/visitors present. Spoke with RN who reports that pt received Osmolite 1.5 @ 30 mL/hr from 1900-0700 with no reported issues. She attempted to give pt TF bolus as ordered at 1000 today but pt was  coughing and only 1/2 can was able to be infused related to this. RN reports plan to give 1300 bolus a little early.   Unable to complete physical assessment this visit related to pt comfort; will again attempt at follow-up. Will increase TF as outlined above. This regimen will provide 1876 kcal (91% minimum estimated kcal), 79 grams of protein (77% minimum estimated protein needs), and 954 mL free water.  Per notes, Palliative care to follow-up with pt today. If pt able to tolerate increased continuous TF rate, will increase to Osmolite 1.5 @ 60 mL/hr from 1900-0700 on 4/9 and also continue current bolus feeds. This is goal rate for TF regimen. Medications reviewed; 10 mg Reglan TID, 40 mEq KCl every 4 hours per PEG, 1 gram Carafate TID. IVF: NS @ 75 mL/hr. Labs reviewed; CBGs: 104-177 mg/dL, Na: 134 mmol/L, K: 2.8 mmol/L, Cl: 99 mmol/L, Ca: 8.3 mg/dL.    4/6 - Pt with PEG and was last seen by [facility] on 12/29/15 with the following plan at that time:  Will change tube feeding regimen to combination of continuous feedings and bolus feedings. Patient will use Osmolite 1.5 at 60 mL an hour for 12 hours daily from 7 PM to 7 AM. Patient will give 1 can bolus feedings at 9 AM, 12 PM in 3 PM. Patient will continue free water flushes of 60 cc free water before and after bolus feeding and continuous feeding. Patient should flush feeding tube with an additional 600 cc free water daily to meet minimum free water needs.  - Spoke with pt's daughter at bedside who provided all information.  - TF pump was received at  home yesterday but plan per [facility] RD was unable to be started due to need for admission.  - Daughter is interested in starting this TF regimen during admission in order to assess tolerance.  - Previously, pt was tolerating 2 cans Osmolite 1.5 TID but was having excessive fullness and delayed gastric emptying which was causing associated N/V; Reglan was added and was helping.  - Daughter reports  that mucositis has been ongoing for ~1 week but has worsened since 12/25/15 with pt unable to tolerate even water.  - Due to this all medications were being given via PEG per pt preference. MD alerted to this.  - Unable to complete physical assessment during this visit but will do so tomorrow is able. -  Daughter reports that pt's weight has been trending up with TF and that pt's strength has been increasing. - Per chart review, weight since 12/09/15 has been fluctuating between 146-152 lbs; will continue to closely monitor weight trends during admission.    Diet Order:  Diet NPO time specified Except for: Ice Chips, Sips with Meds  Skin:  Reviewed, no issues  Last BM:  4/6  Height:   Ht Readings from Last 1 Encounters:  12/31/15 _0  (1.676 m)    Weight:   Wt Readings from Last 1 Encounters:  01/01/16 154 lb 14.4 oz (70.262 kg)    Ideal Body Weight:  59.09 kg (kg)  BMI:  Body mass index is 25.01 kg/(m^2).  Estimated Nutritional Needs:   Kcal:  2070-2275 (30-33 kcal/kg)  Protein:  103-117 grams (1.5-1.7 grams/kg)  Fluid:  >/= 2 L/day  EDUCATION NEEDS:   No education needs identified at this time     Jarome Matin, RD, LDN Inpatient Clinical Dietitian Pager # [phone] After hours/weekend pager # [phone]

## 2016-01-01 NOTE — Consult Note (Signed)
[place] stopped to visit; pt resting.  Possible visit at another time. 11:01 AM Gwynn Burly

## 2016-01-01 NOTE — Consult Note (Signed)
ERROR occurred - no documentation.

## 2016-01-01 NOTE — Progress Notes (Signed)
[facility] PROGRESS NOTE  KEMARI LOGA LOV:[id number] DOB: 03/15/1946 DOA: 12/30/2015 PCP: Rodrigo Ran, MD  Assessment/Plan: #1 systemic inflammatory response syndrome/fevers Patient presented with fevers, odynophagia, noted to have a heart rate up to 100 with a respiratory rate of 32. Etiology of patient's fever is unknown. Chest x-ray was negative for any acute infiltrate. CT abdomen and pelvis with no source of infection noted with resolution of abscess. Urinalysis was nitrite negative leukocytes negative. Patient has been pancultured and results pending. Patient still spiking fevers. Continue empiric IV vancomycin and IV Zosyn. Follow.  #2 odynophagia secondary to mucositis secondary to radiation Patient with a mucositis the into difficulty swallowing and decreased oral intake leading to dehydration. Patient seems congested with secretions noted. Respiratory therapy has been performing suctioning. Continue Magic mouthwash with lidocaine 4 times daily as well as sucralfate. Doxepin swish and swallow has been ordered by palliative care as well as fentanyl patch. Oncology and palliative care following and appreciate input and recommendations.   #3 dehydration Secondary to poor oral intake secondary to mucositis. IV fluids. Supportive care.  #4 severe anemia Likely secondary to chronic disease secondary to cancer. Patient status post transfusion of 3 units packed red blood cells. Hemoglobin currently at 9.0 today. Patient with no overt bleeding. Follow and transfuse for hemoglobin less than 7.  #5 hypomagnesemia/hypokalemia Replete.  #6 constipation Opoid induced. Patient stated had large bowel movements yesterday. Continue current bowel regimen.  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 Per oncology.  #8 hypertension Stable. Continue to hold antihypertensive medications.  #9 prophylaxis SCDs for DVT prophylaxis.  Code Status: Full Family Communication:  Updated patient. No family present. Disposition Plan: Home when medically stable.   Consultants:  Oncology: Dr Dorna Leitz 12/31/2015  Palliative care: Dr.Anwar 12/31/2015  Procedures:  CT abdomen and pelvis 12/31/2015  Chest x-ray 12/30/2015  3 units packed red blood cells 12/30/2015--12/31/2015  Antibiotics:  IV vancomycin 12/30/2015  IV Zosyn 12/30/2015  HPI/Subjective: Patient sleeping however easily arousable. Patient states swallow difficulty improving. Patient states feeling better. Patient noted to still be spiking fevers.  Objective: Filed Vitals:   01/01/16 0554 01/01/16 0854  BP: 158/70   Pulse: 93   Temp: 101.7 F (38.7 C) 99 F (37.2 C)  Resp: 20     Intake/Output Summary (Last 24 hours) at 01/01/16 1341 Last data filed at 01/01/16 1336  Gross per 24 hour  Intake 2708.75 ml  Output    320 ml  Net 2388.75 ml   Filed Weights   12/31/15 0111 01/01/16 0734  Weight: 68.947 kg (152 lb) 70.262 kg (154 lb 14.4 oz)    Exam:   General:  NAD.   Cardiovascular: RRR  Respiratory: CTAB  Abdomen: Soft, nontender, nondistended, positive bowel sounds. PEG tube in place.   Musculoskeletal: No clubbing cyanosis or edema.  Data Reviewed: Basic Metabolic Panel:  Recent Labs Lab 12/30/15 0947 12/30/15 2007 12/31/15 0130 01/01/16 0441  NA 132* 132* 135 134*  K 4.3 3.9 3.6 2.8*  CL  --  98* 102 99*  CO2 29 25 25 26   GLUCOSE 160* 126* 114* 122*  BUN 26.0 26* 22* 18  CREATININE 1.0 0.93 0.90 0.83  CALCIUM 9.4 8.7* 8.5* 8.3*  MG 1.6  --  1.5* 1.9  PHOS  --   --  3.0  --    Liver Function Tests:  Recent Labs Lab 12/30/15 0947 12/30/15 2007 12/31/15 0130  AST 35* 31 27  ALT 38 36  31  ALKPHOS 98 87 73  BILITOT 0.80 1.2 1.2  PROT 6.7 6.3* 5.7*  ALBUMIN 3.1* 3.1* 2.7*   No results for input(s): LIPASE, AMYLASE in the last 168 hours. No results for input(s): AMMONIA in the last 168 hours. CBC:  Recent Labs Lab 12/30/15 0947 12/30/15 2007  12/30/15 2332 12/31/15 0130 12/31/15 1230 01/01/16 0441  WBC 7.0 9.7 5.8 5.1  --  3.8*  NEUTROABS 6.3 8.2*  --   --   --  2.9  HGB 7.3* 5.7* 7.1* 7.1* 8.8* 9.0*  HCT 23.0* 17.1* 21.5* 21.2* 25.6* 26.7*  MCV 65.1* 68.4* 68.7* 68.4*  --  70.1*  PLT 274 289 219 212  --  222   Cardiac Enzymes: No results for input(s): CKTOTAL, CKMB, CKMBINDEX, TROPONINI in the last 168 hours. BNP (last 3 results) No results for input(s): BNP in the last 8760 hours.  ProBNP (last 3 results) No results for input(s): PROBNP in the last 8760 hours.  CBG:  Recent Labs Lab 12/31/15 2021 01/01/16 0004 01/01/16 0356 01/01/16 0733 01/01/16 1133  GLUCAP 177* 151* 104* 152* 152*    Recent Results (from the past 240 hour(s))  Urine culture     Status: Abnormal   Collection Time: 12/30/15  9:50 PM  Result Value Ref Range Status   Specimen Description URINE, CLEAN CATCH  Final   Special Requests NONE  Final   Culture (A)  Final    1,000 COLONIES/mL INSIGNIFICANT GROWTH Performed at [facility]    Report Status 01/01/2016 FINAL  Final  MRSA PCR Screening     Status: Abnormal   Collection Time: 12/31/15  1:26 AM  Result Value Ref Range Status   MRSA by PCR POSITIVE (A) NEGATIVE Final    Comment:        The GeneXpert MRSA Assay (FDA approved for NASAL specimens only), is one component of a comprehensive MRSA colonization surveillance program. It is not intended to diagnose MRSA infection nor to guide or monitor treatment for MRSA infections. RESULT CALLED TO, READ BACK BY AND VERIFIED WITH: K.DION,RN AT 0413 ON 12/31/15 BY W.SHEA      Studies: Dg Chest 2 View  12/30/2015  CLINICAL DATA:  Lethargic.  Near syncope.  Confusion. EXAM: CHEST  2 VIEW COMPARISON:  11/22/2015 FINDINGS: There is a right jugular Port-A-Cath with tip in the SVC just below the azygos vein junction. The lungs are clear. There is no effusion. Hilar and mediastinal contours are unremarkable and unchanged. Heart size  is normal, unchanged. IMPRESSION: No active cardiopulmonary disease. Electronically Signed   By: Ellery Plunk M.D.   On: 12/30/2015 19:59   Ct Abdomen Pelvis W Contrast  12/31/2015  CLINICAL DATA:  New onset syncope.  Worsening weakness.  Sepsis. EXAM: CT ABDOMEN AND PELVIS WITH CONTRAST TECHNIQUE: Multidetector CT imaging of the abdomen and pelvis was performed using the standard protocol following bolus administration of intravenous contrast. CONTRAST:  ISOVUE-300 IOPAMIDOL (ISOVUE-300) INJECTION 61% COMPARISON:  12/08/2015 FINDINGS: Lower chest: Left lower lobe pulmonary nodule is now 13 mm, enlarged from 12/08/2015 when it measured 8 mm. Mild atelectatic appearing linear basilar opacities are present bilaterally. Hepatobiliary: Liver appears normal. Bile ducts are normal. Multiple calculi within the gallbladder. No gallbladder wall thickening or pericholecystic edema. Pancreas: Normal Spleen: Normal, with sub cm focal hypodensity which is unchanged. Adrenals/Urinary Tract: The adrenals and kidneys are normal in appearance with the exception of an unchanged 10 mm hypodensity in the right renal midpole, likely a  cyst. There is no urinary calculus evident. There is no hydronephrosis or ureteral dilatation. Collecting systems and ureters appear unremarkable. Stomach/Bowel: Percutaneous gastrostomy tube. There are normal appearances of the stomach, small bowel and colon. The appendix is normal. Vascular/Lymphatic: The abdominal aorta is normal in caliber. There is mild atherosclerotic calcification. There is no adenopathy in the abdomen or pelvis. Reproductive: Hysterectomy.  No adnexal abnormalities. Other: The epigastric drain has been removed. No recurrent abscess. No focal inflammatory changes in the abdomen or pelvis. No ascites. Musculoskeletal: No significant skeletal lesions. IMPRESSION: 1. Enlarging left lower lobe pulmonary nodule. This could represent a hematogenous metastasis although the  rate of enlargement is faster than typical. 2. Complete resolution of the anterior abdominal abscess. The drainage catheter has been removed and there is no evidence of recurrent or residual collection. 3. No inflammatory changes are evident in the abdomen or pelvis. No adenopathy. No ascites. 4. Cholelithiasis. Electronically Signed   By: Ellery Plunk M.D.   On: 12/31/2015 00:41    Scheduled Meds: . sodium chloride   Intravenous Once  . acetaminophen  650 mg Oral Once  . antiseptic oral rinse  7 mL Mouth Rinse q12n4p  . Chlorhexidine Gluconate Cloth  6 each Topical Q0600  . doxepin  25 mg Oral 4 times per day  . enoxaparin (LOVENOX) injection  40 mg Subcutaneous Q24H  . feeding supplement (OSMOLITE 1.5 CAL)  1,000 mL Per Tube Q24H  . feeding supplement (OSMOLITE 1.5 CAL)  237 mL Per Tube TID  . fentaNYL  37.5 mcg Transdermal Q72H  . guaiFENesin  600 mg Oral 4 times per day  . magic mouthwash w/lidocaine  15 mL Oral QID  . metoCLOPramide  10 mg Oral TID AC & HS  . mupirocin ointment  1 application Nasal BID  . ondansetron (ZOFRAN) IV  8 mg Intravenous 3 times per day  . piperacillin-tazobactam (ZOSYN)  IV  3.375 g Intravenous Q8H  . polyethylene glycol  17 g Oral BID  . potassium chloride  40 mEq Per Tube Q4H  . senna-docusate  1 tablet Oral BID  . sodium chloride flush  10-40 mL Intracatheter Q12H  . sodium chloride flush  3 mL Intravenous Q12H  . sucralfate  1 g Oral TID WC & HS  . vancomycin  500 mg Intravenous Q12H   Continuous Infusions: . 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 [facility] Pager [phone]. If 7PM-7AM, please contact night-coverage at [email or web address], password [email or web address] 01/01/2016, 1:41 PM  LOS: 2 days

## 2016-01-01 NOTE — Evaluation (Signed)
Physical Therapy Evaluation Patient Details Name: KISMET REEDER MRN: [id number] DOB: October 20, 1945 Today's Date: 01/01/2016   History of Present Illness  70 y.o. female with h/o tongue cancer admitted with throat pain, Nausea, vomiting, constipation admitted with sepsis, dehydration, mucositis.   Clinical Impression  Pt is independent with mobility. She ambulated 300' without an assistive device without loss of balance. No further PT indicated, PT signing off.     Follow Up Recommendations No PT follow up    Equipment Recommendations  None recommended by PT    Recommendations for Other Services       Precautions / Restrictions Precautions Precautions: None Precaution Comments: pt denies h/o falls Restrictions Weight Bearing Restrictions: No      Mobility  Bed Mobility Overal bed mobility: Independent                Transfers Overall transfer level: Independent                  Ambulation/Gait Ambulation/Gait assistance: Independent Ambulation Distance (Feet): 300 Feet Assistive device: None Gait Pattern/deviations: WFL(Within Functional Limits)   Gait velocity interpretation: at or above normal speed for age/gender General Gait Details: steady without assistive device  Stairs            Wheelchair Mobility    Modified Rankin (Stroke Patients Only)       Balance Overall balance assessment: Independent                                           Pertinent Vitals/Pain Pain Assessment: 0-10 Pain Score: 8  Pain Location: throat Pain Descriptors / Indicators: Sore Pain Intervention(s): Patient requesting pain meds-RN notified    Home Living Family/patient expects to be discharged to:: Private residence Living Arrangements: Children Available Help at Discharge: Family;Available PRN/intermittently           Home Equipment: None      Prior Function Level of Independence: Independent               Hand  Dominance        Extremity/Trunk Assessment   Upper Extremity Assessment: Overall WFL for tasks assessed           Lower Extremity Assessment: Overall WFL for tasks assessed      Cervical / Trunk Assessment: Normal  Communication   Communication: No difficulties  Cognition Arousal/Alertness: Awake/alert Behavior During Therapy: WFL for tasks assessed/performed Overall Cognitive Status: Within Functional Limits for tasks assessed                      General Comments      Exercises        Assessment/Plan    PT Assessment Patent does not need any further PT services  PT Diagnosis Acute pain   PT Problem List    PT Treatment Interventions     PT Goals (Current goals can be found in the Care Plan section) Acute Rehab PT Goals PT Goal Formulation: All assessment and education complete, DC therapy    Frequency     Barriers to discharge        Co-evaluation               End of Session   Activity Tolerance: Patient tolerated treatment well Patient left: in chair;with call bell/phone within reach Nurse Communication: Mobility status  Time: 6962-9528 PT Time Calculation (min) (ACUTE ONLY): 13 min   Charges:   PT Evaluation $PT Eval Low Complexity: 1 Procedure     PT G Codes:        Ralene Bathe Kistler 01/01/2016, 1:20 PM [phone]

## 2016-01-01 NOTE — Progress Notes (Signed)
Debra Farrell   DOB:03-09-1946   WU#:[id number]    Subjective: She felt a little better. She resumed fentanyl patch last night per recommendation from palliative care team and her pain is to be better controlled. According to her daughter, the patient is reluctant to ask for breakthrough pain medicine. She is requesting slight adjustment to the fentanyl patch due to ongoing persistent pain. The scheduled Reglan seems to be helping with nausea before meals. Her daughter is requesting also scheduled IV Zofran. Constipation had resolved. The patient continues to have persistent fever. no further confusion is noted  Objective:  Filed Vitals:   01/01/16 0554 01/01/16 0854  BP: 158/70   Pulse: 93   Temp: 101.7 F (38.7 C) 99 F (37.2 C)  Resp: 20      Intake/Output Summary (Last 24 hours) at 01/01/16 0940 Last data filed at 01/01/16 0840  Gross per 24 hour  Intake 2055.75 ml  Output    320 ml  Net 1735.75 ml    GENERAL:alert, no distress and comfortable SKIN: skin color, texture, turgor are normal, no rashes or significant lesions EYES: normal, Conjunctiva are pink and non-injected, sclera clear OROPHARYNX. Persistent mucositis. No thrush.  NECK: supple, thyroid normal size, non-tender, without nodularity LYMPH:  Persistent lymphadenopathy on the rest of the neck. LUNGS: clear to auscultation and percussion with normal breathing effort HEART: regular rate & rhythm and no murmurs and no lower extremity edema ABDOMEN:abdomen soft, non-tender and normal bowel sounds. Feeding tube site looks okay Musculoskeletal:no cyanosis of digits and no clubbing  NEURO: alert & oriented x 3 with fluent speech, no focal motor/sensory deficits   Labs:  Lab Results  Component Value Date   WBC 3.8* 01/01/2016   HGB 9.0* 01/01/2016   HCT 26.7* 01/01/2016   MCV 70.1* 01/01/2016   PLT 222 01/01/2016   NEUTROABS 2.9 01/01/2016    Lab Results  Component Value Date   NA 134* 01/01/2016   K 2.8*  01/01/2016   CL 99* 01/01/2016   CO2 26 01/01/2016    Studies:  Dg Chest 2 View  12/30/2015  CLINICAL DATA:  Lethargic.  Near syncope.  Confusion. EXAM: CHEST  2 VIEW COMPARISON:  11/22/2015 FINDINGS: There is a right jugular Port-A-Cath with tip in the SVC just below the azygos vein junction. The lungs are clear. There is no effusion. Hilar and mediastinal contours are unremarkable and unchanged. Heart size is normal, unchanged. IMPRESSION: No active cardiopulmonary disease. Electronically Signed   By: Ellery Plunk M.D.   On: 12/30/2015 19:59   Ct Abdomen Pelvis W Contrast  12/31/2015  CLINICAL DATA:  New onset syncope.  Worsening weakness.  Sepsis. EXAM: CT ABDOMEN AND PELVIS WITH CONTRAST TECHNIQUE: Multidetector CT imaging of the abdomen and pelvis was performed using the standard protocol following bolus administration of intravenous contrast. CONTRAST:  ISOVUE-300 IOPAMIDOL (ISOVUE-300) INJECTION 61% COMPARISON:  12/08/2015 FINDINGS: Lower chest: Left lower lobe pulmonary nodule is now 13 mm, enlarged from 12/08/2015 when it measured 8 mm. Mild atelectatic appearing linear basilar opacities are present bilaterally. Hepatobiliary: Liver appears normal. Bile ducts are normal. Multiple calculi within the gallbladder. No gallbladder wall thickening or pericholecystic edema. Pancreas: Normal Spleen: Normal, with sub cm focal hypodensity which is unchanged. Adrenals/Urinary Tract: The adrenals and kidneys are normal in appearance with the exception of an unchanged 10 mm hypodensity in the right renal midpole, likely a cyst. There is no urinary calculus evident. There is no hydronephrosis or ureteral dilatation. Collecting systems  and ureters appear unremarkable. Stomach/Bowel: Percutaneous gastrostomy tube. There are normal appearances of the stomach, small bowel and colon. The appendix is normal. Vascular/Lymphatic: The abdominal aorta is normal in caliber. There is mild atherosclerotic  calcification. There is no adenopathy in the abdomen or pelvis. Reproductive: Hysterectomy.  No adnexal abnormalities. Other: The epigastric drain has been removed. No recurrent abscess. No focal inflammatory changes in the abdomen or pelvis. No ascites. Musculoskeletal: No significant skeletal lesions. IMPRESSION: 1. Enlarging left lower lobe pulmonary nodule. This could represent a hematogenous metastasis although the rate of enlargement is faster than typical. 2. Complete resolution of the anterior abdominal abscess. The drainage catheter has been removed and there is no evidence of recurrent or residual collection. 3. No inflammatory changes are evident in the abdomen or pelvis. No adenopathy. No ascites. 4. Cholelithiasis. Electronically Signed   By: Ellery Plunk M.D.   On: 12/31/2015 00:41    Assessment & Plan:   Cancer of base of tongue (HCC) The patient has experienced significant decline since the last time I saw her. Continue aggressive supportive care. I would defer to the radiation oncologist to determine further radiation therapy  Throat pain in adult She has severe, uncontrolled throat pain for several days. We discussed pain management. We will increase fentanyl patch to 37.5 g. Appreciate assistance from palliative care team for pain management  Constipation due to opioid therapy, resolved She has severe, uncontrolled constipation for 4 days, subsequently resolved with laxatives. Continue laxative management.  Intractable nausea and vomiting Mucus gagging She has severe intractable nausea and vomiting likely multifactorial from mucous secretion, constipation and delayed gastric clearance from nutritional supplements. I recommend IV fluid hydration therapy and IV antiemetics on a regular basis.  Severe protein-calorie malnutrition (HCC) She is not able to tolerate her nutritional supplement due to uncontrolled nausea and vomiting. 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 She is using topical emollient cream.  Intermittent confusion Could be related to side effects of pain medicine or sepsis She is more alert now compared to last night  Recent MRSA infection, recurrent fever I agree with broad-spectrum IV antibiotics. Cultures are pending.  Enlarging lung nodule Worrisome for metastatic cancer  Severe anemia, anemia of chronic disease She had received blood transfusions Currently asymptomatic Observe closely  Severe hypokalemia Due to potassium wasting from Zosyn and poor oral intake Continue replacement therapy  CODE STATUS Goals of care I spoke with her daughter, [name], regarding goals of care and CODE STATUS. The patient has verbalized the desire for DO NOT RESUSCITATE in the emergency room and her daughter felt that it could be related to her confusion Having said that, she felt that her mother is suffering from multiple side effects Appreciate palliative care assistance to address goals of care   Discharge planning The patient is not ready for discharge due to unresolving issues I will continue to follow, will return next week   [facility], Jazzy Parmer, MD 01/01/2016  9:40 AM

## 2016-01-02 ENCOUNTER — Ambulatory Visit: Payer: Self-pay

## 2016-01-02 DIAGNOSIS — D563 Thalassemia minor: Secondary | ICD-10-CM | POA: Diagnosis not present

## 2016-01-02 DIAGNOSIS — E860 Dehydration: Secondary | ICD-10-CM | POA: Diagnosis not present

## 2016-01-02 DIAGNOSIS — I10 Essential (primary) hypertension: Secondary | ICD-10-CM | POA: Diagnosis not present

## 2016-01-02 DIAGNOSIS — C01 Malignant neoplasm of base of tongue: Secondary | ICD-10-CM | POA: Diagnosis not present

## 2016-01-02 DIAGNOSIS — E440 Moderate protein-calorie malnutrition: Secondary | ICD-10-CM | POA: Diagnosis not present

## 2016-01-02 DIAGNOSIS — Z931 Gastrostomy status: Secondary | ICD-10-CM | POA: Diagnosis not present

## 2016-01-02 DIAGNOSIS — R1310 Dysphagia, unspecified: Secondary | ICD-10-CM | POA: Diagnosis not present

## 2016-01-02 DIAGNOSIS — A419 Sepsis, unspecified organism: Secondary | ICD-10-CM | POA: Diagnosis not present

## 2016-01-02 LAB — BASIC METABOLIC PANEL
Anion gap: 10 (ref 5–15)
BUN: 11 mg/dL (ref 6–20)
CALCIUM: 8.2 mg/dL — AB (ref 8.9–10.3)
CHLORIDE: 96 mmol/L — AB (ref 101–111)
CO2: 27 mmol/L (ref 22–32)
CREATININE: 0.82 mg/dL (ref 0.44–1.00)
Glucose, Bld: 146 mg/dL — ABNORMAL HIGH (ref 65–99)
Potassium: 3.3 mmol/L — ABNORMAL LOW (ref 3.5–5.1)
SODIUM: 133 mmol/L — AB (ref 135–145)

## 2016-01-02 LAB — CBC
HCT: 26.1 % — ABNORMAL LOW (ref 36.0–46.0)
Hemoglobin: 8.8 g/dL — ABNORMAL LOW (ref 12.0–15.0)
MCH: 23.5 pg — ABNORMAL LOW (ref 26.0–34.0)
MCHC: 33.7 g/dL (ref 30.0–36.0)
MCV: 69.6 fL — AB (ref 78.0–100.0)
PLATELETS: 215 10*3/uL (ref 150–400)
RBC: 3.75 MIL/uL — AB (ref 3.87–5.11)
WBC: 3.2 10*3/uL — AB (ref 4.0–10.5)

## 2016-01-02 LAB — GLUCOSE, CAPILLARY
GLUCOSE-CAPILLARY: 115 mg/dL — AB (ref 65–99)
GLUCOSE-CAPILLARY: 132 mg/dL — AB (ref 65–99)
GLUCOSE-CAPILLARY: 152 mg/dL — AB (ref 65–99)
Glucose-Capillary: 136 mg/dL — ABNORMAL HIGH (ref 65–99)
Glucose-Capillary: 132 mg/dL — ABNORMAL HIGH (ref 65–99)
Glucose-Capillary: 124 mg/dL — ABNORMAL HIGH (ref 65–99)

## 2016-01-02 MED ORDER — POTASSIUM CHLORIDE 20 MEQ/15ML (10%) PO SOLN
40.0000 meq | ORAL | Status: AC
  Administered 2016-01-02 (×2): 40 meq
  Filled 2016-01-02 (×2): qty 30

## 2016-01-02 NOTE — Progress Notes (Signed)
[facility] PROGRESS NOTE  Debra Farrell ZOX:[id number] DOB: 04/02/1946 DOA: 12/30/2015 PCP: Rodrigo Ran, MD  Assessment/Plan: #1 systemic inflammatory response syndrome/fevers Patient presented with fevers, odynophagia, noted to have a heart rate up to 100 with a respiratory rate of 32. Etiology of patient's fever is unknown. Chest x-ray was negative for any acute infiltrate. CT abdomen and pelvis with no source of infection noted with resolution of abscess. Urinalysis was nitrite negative leukocytes negative. Patient has been pancultured and results pending. Patient still spiking fevers however fever curve trending down. Continue empiric IV vancomycin and IV Zosyn. Follow.  #2 odynophagia secondary to mucositis secondary to radiation Patient with a mucositis the into difficulty swallowing and decreased oral intake leading to dehydration. Patient seems congested with secretions noted. Respiratory therapy has been performing suctioning. Continue Magic mouthwash with lidocaine 4 times daily as well as sucralfate. Doxepin swish and swallow has been ordered by palliative care as well as fentanyl patch. Oncology and palliative care following and appreciate input and recommendations.   #3 dehydration Secondary to poor oral intake secondary to mucositis. IV fluids. Supportive care.  #4 severe anemia Likely secondary to chronic disease secondary to cancer. Patient status post transfusion of 3 units packed red blood cells. Hemoglobin currently at  to 8.8 today. Patient with no overt bleeding. Follow and transfuse for hemoglobin less than 7.  #5 hypomagnesemia/hypokalemia Secondary to GI losses. Replete.  #6 constipation Opoid induced. Patient stated had large bowel movements. Patient now with loose stools. Discontinue MiraLAX and Senokot-S. Follow. Pyesterday. Continue current bowel regimen.  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 Per oncology.  #8 hypertension Stable.  Continue to hold antihypertensive medications.  #9 prophylaxis SCDs for DVT prophylaxis.  Code Status: Full Family Communication: Updated patient. No family present. Disposition Plan: Home when medically stable.   Consultants:  Oncology: Dr Dorna Leitz 12/31/2015  Palliative care: Dr.Anwar 12/31/2015  Procedures:  CT abdomen and pelvis 12/31/2015  Chest x-ray 12/30/2015  3 units packed red blood cells 12/30/2015--12/31/2015  Antibiotics:  IV vancomycin 12/30/2015  IV Zosyn 12/30/2015  HPI/Subjective: Patient states swallow difficulty improving. Patient states feeling better. Patient stated multiple bowel movements. Patient states stool is loose and asking for stool softeners to be discontinued.  Objective: Filed Vitals:   01/01/16 2023 01/02/16 0628  BP: 165/84 156/68  Pulse: 95 87  Temp: 99 F (37.2 C) 98.7 F (37.1 C)  Resp: 22 20    Intake/Output Summary (Last 24 hours) at 01/02/16 1055 Last data filed at 01/02/16 0700  Gross per 24 hour  Intake 3045.75 ml  Output      0 ml  Net 3045.75 ml   Filed Weights   12/31/15 0111 01/01/16 0734 01/02/16 0628  Weight: 68.947 kg (152 lb) 70.262 kg (154 lb 14.4 oz) 70.308 kg (155 lb)    Exam:   General:  NAD.   Cardiovascular: RRR  Respiratory: CTAB  Abdomen: Soft, nontender, nondistended, positive bowel sounds. PEG tube in place.   Musculoskeletal: No clubbing cyanosis or edema.  Data Reviewed: Basic Metabolic Panel:  Recent Labs Lab 12/30/15 0947 12/30/15 2007 12/31/15 0130 01/01/16 0441 01/02/16 0450  NA 132* 132* 135 134* 133*  K 4.3 3.9 3.6 2.8* 3.3*  CL  --  98* 102 99* 96*  CO2 29 25 25 26 27   GLUCOSE 160* 126* 114* 122* 146*  BUN 26.0 26* 22* 18 11  CREATININE 1.0 0.93 0.90 0.83 0.82  CALCIUM 9.4 8.7* 8.5* 8.3*  8.2*  MG 1.6  --  1.5* 1.9  --   PHOS  --   --  3.0  --   --    Liver Function Tests:  Recent Labs Lab 12/30/15 0947 12/30/15 2007 12/31/15 0130  AST 35* 31 27  ALT 38  36 31  ALKPHOS 98 87 73  BILITOT 0.80 1.2 1.2  PROT 6.7 6.3* 5.7*  ALBUMIN 3.1* 3.1* 2.7*   No results for input(s): LIPASE, AMYLASE in the last 168 hours. No results for input(s): AMMONIA in the last 168 hours. CBC:  Recent Labs Lab 12/30/15 0947  12/30/15 2007 12/30/15 2332 12/31/15 0130 12/31/15 1230 01/01/16 0441 01/02/16 0450  WBC 7.0  --  9.7 5.8 5.1  --  3.8* 3.2*  NEUTROABS 6.3  --  8.2*  --   --   --  2.9  --   HGB 7.3*  < > 5.7* 7.1* 7.1* 8.8* 9.0* 8.8*  HCT 23.0*  < > 17.1* 21.5* 21.2* 25.6* 26.7* 26.1*  MCV 65.1*  --  68.4* 68.7* 68.4*  --  70.1* 69.6*  PLT 274  --  289 219 212  --  222 215  < > = values in this interval not displayed. Cardiac Enzymes: No results for input(s): CKTOTAL, CKMB, CKMBINDEX, TROPONINI in the last 168 hours. BNP (last 3 results) No results for input(s): BNP in the last 8760 hours.  ProBNP (last 3 results) No results for input(s): PROBNP in the last 8760 hours.  CBG:  Recent Labs Lab 01/01/16 1651 01/01/16 2020 01/02/16 0017 01/02/16 0419 01/02/16 0741  GLUCAP 100* 111* 152* 136* 115*    Recent Results (from the past 240 hour(s))  Culture, blood (routine x 2)     Status: None (Preliminary result)   Collection Time: 12/30/15  8:04 PM  Result Value Ref Range Status   Specimen Description BLOOD RIGHT CHEST PORT  Final   Special Requests BOTTLES DRAWN AEROBIC AND ANAEROBIC 5CC EACH  Final   Culture   Final    NO GROWTH 2 DAYS Performed at [facility]    Report Status PENDING  Incomplete  Culture, blood (routine x 2)     Status: None (Preliminary result)   Collection Time: 12/30/15  8:43 PM  Result Value Ref Range Status   Specimen Description BLOOD LEFT WRIST  Final   Special Requests BOTTLES DRAWN AEROBIC AND ANAEROBIC 5CC EACH  Final   Culture   Final    NO GROWTH 2 DAYS Performed at [facility]    Report Status PENDING  Incomplete  Urine culture     Status: Abnormal   Collection Time: 12/30/15   9:50 PM  Result Value Ref Range Status   Specimen Description URINE, CLEAN CATCH  Final   Special Requests NONE  Final   Culture (A)  Final    1,000 COLONIES/mL INSIGNIFICANT GROWTH Performed at [facility]    Report Status 01/01/2016 FINAL  Final  MRSA PCR Screening     Status: Abnormal   Collection Time: 12/31/15  1:26 AM  Result Value Ref Range Status   MRSA by PCR POSITIVE (A) NEGATIVE Final    Comment:        The GeneXpert MRSA Assay (FDA approved for NASAL specimens only), is one component of a comprehensive MRSA colonization surveillance program. It is not intended to diagnose MRSA infection nor to guide or monitor treatment for MRSA infections. RESULT CALLED TO, READ BACK BY AND VERIFIED WITH: K.DION,RN  AT 0413 ON 12/31/15 BY W.SHEA      Studies: No results found.  Scheduled Meds: . sodium chloride   Intravenous Once  . acetaminophen  650 mg Oral Once  . antiseptic oral rinse  7 mL Mouth Rinse q12n4p  . Chlorhexidine Gluconate Cloth  6 each Topical Q0600  . doxepin  25 mg Oral 4 times per day  . enoxaparin (LOVENOX) injection  40 mg Subcutaneous Q24H  . feeding supplement (OSMOLITE 1.5 CAL)  1,000 mL Per Tube Q24H  . feeding supplement (OSMOLITE 1.5 CAL)  237 mL Per Tube TID  . fentaNYL  37.5 mcg Transdermal Q72H  . guaiFENesin  600 mg Oral 4 times per day  . magic mouthwash w/lidocaine  15 mL Oral QID  . metoCLOPramide  10 mg Oral TID AC & HS  . mupirocin ointment  1 application Nasal BID  . ondansetron (ZOFRAN) IV  8 mg Intravenous 3 times per day  . piperacillin-tazobactam (ZOSYN)  IV  3.375 g Intravenous Q8H  . polyethylene glycol  17 g Oral BID  . potassium chloride  40 mEq Per Tube Q4H  . senna-docusate  1 tablet Oral BID  . sodium chloride flush  10-40 mL Intracatheter Q12H  . sodium chloride flush  3 mL Intravenous Q12H  . sucralfate  1 g Oral TID WC & HS  . vancomycin  500 mg Intravenous Q12H   Continuous Infusions: . 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 [facility] Pager [phone]. If 7PM-7AM, please contact night-coverage at [email or web address], password [facility] 01/02/2016, 10:55 AM  LOS: 3 days

## 2016-01-02 NOTE — Progress Notes (Signed)
Pharmacy Antibiotic Note  Debra Farrell is a 70 y.o. female who was seen at the [facility] earlier today for blood transfusion and was found to have an elevated temperature. After discharge, patient became lethargic at home and unable to follow commands. Patient presented to [facility] ED on 12/30/2015 with complaints of near syncope. Pharmacy has been consulted for Vancomycin and Zosyn dosing for suspected sepsis.      Plan: Vancomycin 1g IV x 1, then '500mg'$  IV q12h. Goal trough 15-20 mcg/mL. Plan for Vancomycin trough level at 0900 on 4/9 Zosyn 3.375g IV x 1 over 30 min in ED, then Zosyn 3.375g IV q8h (infuse over 4 hours). Monitor renal function, cultures, clinical course.  Height: '5\' 6"'$  (167.6 cm) Weight: 155 lb (70.308 kg) IBW/kg (Calculated) : 59.3  Temp (24hrs), Avg:99.1 F (37.3 C), Min:98.2 F (36.8 C), Max:100.3 F (37.9 C)   Recent Labs Lab 12/30/15 0947 12/30/15 2007 12/30/15 2016 12/30/15 2332 12/31/15 0130 01/01/16 0441 01/02/16 0450  WBC 7.0 9.7  --  5.8 5.1 3.8* 3.2*  CREATININE 1.0 0.93  --   --  0.90 0.83 0.82  LATICACIDVEN  --   --  0.67  --   --   --   --     Estimated Creatinine Clearance: 61.5 mL/min (by C-G formula based on Cr of 0.82).    Allergies  Allergen Reactions  . Morphine And Related Nausea And Vomiting   Antimicrobials this admission: 4/5 >> Vancomycin >> 4/5 >> Zosyn >>  Dose adjustments this admission: 4/9 trough at 0900 =   Microbiology results: 4/5 BCx x 2: ngtd 4/5 UCx: insignificant growth-final 4/6 Influenza PCR: neg 4/6 MRSA PCR: positive  Thank you for allowing pharmacy to be a part of this patient's care.  Minda Ditto PharmD Pager [phone] 01/02/2016, 12:01 PM

## 2016-01-03 LAB — BASIC METABOLIC PANEL
ANION GAP: 9 (ref 5–15)
BUN: 12 mg/dL (ref 6–20)
CALCIUM: 8.8 mg/dL — AB (ref 8.9–10.3)
CO2: 29 mmol/L (ref 22–32)
Chloride: 96 mmol/L — ABNORMAL LOW (ref 101–111)
Creatinine, Ser: 0.85 mg/dL (ref 0.44–1.00)
Glucose, Bld: 158 mg/dL — ABNORMAL HIGH (ref 65–99)
Potassium: 3.5 mmol/L (ref 3.5–5.1)
SODIUM: 134 mmol/L — AB (ref 135–145)

## 2016-01-03 LAB — GLUCOSE, CAPILLARY
GLUCOSE-CAPILLARY: 144 mg/dL — AB (ref 65–99)
GLUCOSE-CAPILLARY: 150 mg/dL — AB (ref 65–99)
GLUCOSE-CAPILLARY: 181 mg/dL — AB (ref 65–99)
GLUCOSE-CAPILLARY: 110 mg/dL — AB (ref 65–99)
GLUCOSE-CAPILLARY: 134 mg/dL — AB (ref 65–99)
Glucose-Capillary: 156 mg/dL — ABNORMAL HIGH (ref 65–99)

## 2016-01-03 LAB — CBC
HCT: 28.2 % — ABNORMAL LOW (ref 36.0–46.0)
Hemoglobin: 9.6 g/dL — ABNORMAL LOW (ref 12.0–15.0)
MCH: 24.2 pg — ABNORMAL LOW (ref 26.0–34.0)
MCHC: 34 g/dL (ref 30.0–36.0)
MCV: 71.2 fL — ABNORMAL LOW (ref 78.0–100.0)
PLATELETS: 248 10*3/uL (ref 150–400)
RBC: 3.96 MIL/uL (ref 3.87–5.11)
RDW: 23.9 % — AB (ref 11.5–15.5)
WBC: 3.7 10*3/uL — AB (ref 4.0–10.5)

## 2016-01-03 LAB — VANCOMYCIN, TROUGH: VANCOMYCIN TR: 6 ug/mL — AB (ref 10.0–20.0)

## 2016-01-03 MED ORDER — POTASSIUM CHLORIDE 20 MEQ/15ML (10%) PO SOLN
40.0000 meq | Freq: Once | ORAL | Status: AC
  Administered 2016-01-03: 40 meq via ORAL
  Filled 2016-01-03: qty 30

## 2016-01-03 MED ORDER — OSMOLITE 1.5 CAL PO LIQD
1000.0000 mL | ORAL | Status: DC
  Administered 2016-01-03 – 2016-01-04 (×2): 1000 mL
  Filled 2016-01-03 (×3): qty 1000

## 2016-01-03 MED ORDER — VANCOMYCIN HCL IN DEXTROSE 1-5 GM/200ML-% IV SOLN
1000.0000 mg | INTRAVENOUS | Status: AC
  Administered 2016-01-03: 1000 mg via INTRAVENOUS
  Filled 2016-01-03: qty 200

## 2016-01-03 MED ORDER — VANCOMYCIN HCL IN DEXTROSE 750-5 MG/150ML-% IV SOLN: 750.0000 mg | Freq: Two times a day (BID) | INTRAVENOUS | Status: DC

## 2016-01-03 MED ORDER — POTASSIUM CHLORIDE 20 MEQ PO PACK
40.0000 meq | PACK | Freq: Once | ORAL | Status: DC
  Filled 2016-01-03: qty 2

## 2016-01-03 NOTE — Progress Notes (Signed)
Nutrition Follow-up  DOCUMENTATION CODES:   Not applicable  INTERVENTION:  -Increase continuous TF to Osmolite 1.5 @ 34m/hr from 1900-700 -Continue 1 can Osmolite 1.5 TID (1000, 1300, 1600) -Will continue to monitor free water, provide flushes as IVF are changed -Continue to follow for TF needs -TF regimen provides 2145 calories (100% needs) , 90g protein (87% needs), 1091cc free water  NUTRITION DIAGNOSIS:   Increased nutrient needs related to catabolic illness, cancer and cancer related treatments as evidenced by estimated needs. ongoing  GOAL:   Patient will meet greater than or equal to 90% of their needs progressing  MONITOR:   TF tolerance, Weight trends, Labs, Skin, I & O's  REASON FOR ASSESSMENT:   Malnutrition Screening Tool, Consult Enteral/tube feeding initiation and management  ASSESSMENT:   70y.o. female has a past medical history of Hypertension; Hyperlipidemia; Eczema; Thalassemia minor; [place] of [place] of [place] ([place] (10/14/2015); Laceration (04/2014); Intractable nausea and vomiting (11/13/2015); [place] (11/30/2015); Infection with methicillin-resistant Staphylococcus aureus (MRSA) (11/30/2015); Nausea without vomiting (12/16/2015); Anemia in chronic illness (12/16/2015); and Throat pain in adult (12/29/2015). Presented with febrile episode after receiving Dulcolax suppository. For the past few days patient had had severe mucositis throat and mouth pain difficulty swallowing nausea and vomiting and have had severe constipation for past 5 days. Patient has not been able to eat well and not able to tolerate her nutritional supplements. She has nutritional supplement through feeding pump but haven't been able to keep that down. [facility] noted to have hemoglobin of 7.3. On febrile up to 101.3  4/9 -Checked on pt today and was able to perform physical exam. She seemed to be doing considerably better. Less pain, overall better  feeling of wellbeing. She has no complaints about tubefeed; no nausea or aspiration.  Nutrition-Focused physical exam completed. Findings are moderate fat depletion, mild-modearte muscle depletion, and no edema.   -Plan for today: Will increase continuous Osmolite 1.5 to goal rate, 665mhr and monitor for tolerance.  Per Notes:  Chest X-Ray Negative CT abd and pelvis found no source of infection r/t SIRS -> abscess resolved. She continues to spike fevers but they are trending downwards.  Palliative care followed up with patient -> remains a full code. Constipation has resolved. She continues to use suction for secretions -> managing ok at this point.  Labs: CBGs 156-181; Na 134; Phos 3.0;  Medications: Fentanyl Patch 37.5 mcg; Reglan 10 mg TID;  4/7 Pt sleeping at time of RD visit and no family/visitors present. Spoke with RN who reports that pt received Osmolite 1.5 @ 30 mL/hr from 1900-0700 with no reported issues. She attempted to give pt TF bolus as ordered at 1000 today but pt was coughing and only 1/2 can was able to be infused related to this. RN reports plan to give 1300 bolus a little early.   Unable to complete physical assessment this visit related to pt comfort; will again attempt at follow-up. Will increase TF as outlined above. This regimen will provide 1876 kcal (91% minimum estimated kcal), 79 grams of protein (77% minimum estimated protein needs), and 954 mL free water.  Per notes, Palliative care to follow-up with pt today. If pt able to tolerate increased continuous TF rate, will increase to Osmolite 1.5 @ 60 mL/hr from 1900-0700 on 4/9 and also continue current bolus feeds. This is goal rate for TF regimen. Medications reviewed; 10 mg Reglan TID, 40 mEq KCl every 4 hours per  PEG, 1 gram Carafate TID. IVF: NS @ 75 mL/hr. Labs reviewed; CBGs: 104-177 mg/dL, Na: 134 mmol/L, K: 2.8 mmol/L, Cl: 99 mmol/L, Ca: 8.3 mg/dL.    4/6 - Pt with PEG and was last seen by [place] on 12/29/15 with the following plan at that time:  Will change tube feeding regimen to combination of continuous feedings and bolus feedings. Patient will use Osmolite 1.5 at 60 mL an hour for 12 hours daily from 7 PM to 7 AM. Patient will give 1 can bolus feedings at 9 AM, 12 PM in 3 PM. Patient will continue free water flushes of 60 cc free water before and after bolus feeding and continuous feeding. Patient should flush feeding tube with an additional 600 cc free water daily to meet minimum free water needs.  - Spoke with pt's daughter at bedside who provided all information.  - TF pump was received at home yesterday but plan per [facility] RD was unable to be started due to need for admission.  - Daughter is interested in starting this TF regimen during admission in order to assess tolerance.  - Previously, pt was tolerating 2 cans Osmolite 1.5 TID but was having excessive fullness and delayed gastric emptying which was causing associated N/V; Reglan was added and was helping.  - Daughter reports that mucositis has been ongoing for ~1 week but has worsened since 12/25/15 with pt unable to tolerate even water.  - Due to this all medications were being given via PEG per pt preference. MD alerted to this.  - Unable to complete physical assessment during this visit but will do so tomorrow is able. - Daughter reports that pt's weight has been trending up with TF and that pt's strength has been increasing. - Per chart review, weight since 12/09/15 has been fluctuating between 146-152 lbs; will continue to closely monitor weight trends during admission.   Diet Order:  Diet NPO time specified Except for: Ice Chips, Sips with Meds  Skin:  Reviewed, no issues  Last BM:  4/6  Height:   Ht Readings from Last 1 Encounters:  12/31/15 '5\' 6"'$  (1.676 m)    Weight:   Wt Readings from Last 1 Encounters:  01/03/16 154 lb 8.7 oz (70.1 kg)    Ideal Body Weight:  59.09 kg (kg)  BMI:  Body mass  index is 24.96 kg/(m^2).  Estimated Nutritional Needs:   Kcal:  2070-2275 (30-33 kcal/kg)  Protein:  103-117 grams (1.5-1.7 grams/kg)  Fluid:  >/= 2 L/day  EDUCATION NEEDS:   No education needs identified at this time  Satira Anis. Rankin Coolman, MS, RD LDN After Hours/Weekend Pager [phone]

## 2016-01-03 NOTE — Progress Notes (Signed)
[facility] PROGRESS NOTE  Debra Farrell ZOX:[id number] DOB: 1946/03/10 DOA: 12/30/2015 PCP: Rodrigo Ran, MD  Assessment/Plan: #1 systemic inflammatory response syndrome/fevers Patient presented with fevers, odynophagia, noted to have a heart rate up to 100 with a respiratory rate of 32. Etiology of patient's fever is unknown. Chest x-ray was negative for any acute infiltrate. CT abdomen and pelvis with no source of infection noted with resolution of abscess. Urinalysis was nitrite negative leukocytes negative. Patient has been pancultured and results pending. Patient still spiking fevers however fever curve trending down. Continue empiric IV Zosyn. D/C IV Vancomycin. Follow.  #2 odynophagia secondary to mucositis secondary to radiation Patient with a mucositis the into difficulty swallowing and decreased oral intake leading to dehydration. Patient seems congested with secretions noted. Respiratory therapy has been performing suctioning. Continue Magic mouthwash with lidocaine 4 times daily as well as sucralfate. Doxepin swish and swallow has been ordered by palliative care as well as fentanyl patch. Oncology and palliative care following and appreciate input and recommendations.   #3 dehydration Secondary to poor oral intake secondary to mucositis. IV fluids. Supportive care.  #4 severe anemia Likely secondary to chronic disease secondary to cancer. Patient status post transfusion of 3 units packed red blood cells. Hemoglobin currently at 9.6 today. Patient with no overt bleeding. Follow and transfuse for hemoglobin less than 7.  #5 hypomagnesemia/hypokalemia Secondary to GI losses. Replete.  #6 constipation Opoid induced. Patient stated had large bowel movements. Patient now with loose stools. Discontinue MiraLAX and Senokot-S. Hold reglan.   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 Per oncology.  #8 hypertension Stable. Continue to hold antihypertensive  medications.  #9 prophylaxis SCDs for DVT prophylaxis.  Code Status: Full Family Communication: Updated patient and daughter at bedside. Disposition Plan: Home when medically stable.   Consultants:  Oncology: Dr Dorna Leitz 12/31/2015  Palliative care: Dr.Anwar 12/31/2015  Procedures:  CT abdomen and pelvis 12/31/2015  Chest x-ray 12/30/2015  3 units packed red blood cells 12/30/2015--12/31/2015  Antibiotics:  IV vancomycin 12/30/2015>>>>>01/03/2016  IV Zosyn 12/30/2015  HPI/Subjective: Patient states swallow difficulty improving. Patient states feeling better. Patient stated multiple bowel movements. Patient states stool is loose and asking for stool softeners to be discontinued.  Objective: Filed Vitals:   01/02/16 2004 01/03/16 0532  BP: 171/73 170/69  Pulse: 92 86  Temp: 99.2 F (37.3 C) 98.4 F (36.9 C)  Resp: 16 20    Intake/Output Summary (Last 24 hours) at 01/03/16 1236 Last data filed at 01/03/16 1046  Gross per 24 hour  Intake 2643.5 ml  Output    500 ml  Net 2143.5 ml   Filed Weights   01/01/16 0734 01/02/16 0628 01/03/16 0500  Weight: 70.262 kg (154 lb 14.4 oz) 70.308 kg (155 lb) 70.1 kg (154 lb 8.7 oz)    Exam:   General:  NAD.   Cardiovascular: RRR  Respiratory: CTAB  Abdomen: Soft, nontender, nondistended, positive bowel sounds. PEG tube in place.   Musculoskeletal: No clubbing cyanosis or edema.  Data Reviewed: Basic Metabolic Panel:  Recent Labs Lab 12/30/15 0947 12/30/15 2007 12/31/15 0130 01/01/16 0441 01/02/16 0450 01/03/16 0350  NA 132* 132* 135 134* 133* 134*  K 4.3 3.9 3.6 2.8* 3.3* 3.5  CL  --  98* 102 99* 96* 96*  CO2 29 25 25 26 27 29   GLUCOSE 160* 126* 114* 122* 146* 158*  BUN 26.0 26* 22* 18 11 12   CREATININE 1.0 0.93 0.90 0.83 0.82 0.85  CALCIUM 9.4 8.7* 8.5* 8.3* 8.2* 8.8*  MG 1.6  --  1.5* 1.9  --   --   PHOS  --   --  3.0  --   --   --    Liver Function Tests:  Recent Labs Lab 12/30/15 0947  12/30/15 2007 12/31/15 0130  AST 35* 31 27  ALT 38 36 31  ALKPHOS 98 87 73  BILITOT 0.80 1.2 1.2  PROT 6.7 6.3* 5.7*  ALBUMIN 3.1* 3.1* 2.7*   No results for input(s): LIPASE, AMYLASE in the last 168 hours. No results for input(s): AMMONIA in the last 168 hours. CBC:  Recent Labs Lab 12/30/15 0947  12/30/15 2007 12/30/15 2332 12/31/15 0130 12/31/15 1230 01/01/16 0441 01/02/16 0450 01/03/16 0350  WBC 7.0  < > 9.7 5.8 5.1  --  3.8* 3.2* 3.7*  NEUTROABS 6.3  --  8.2*  --   --   --  2.9  --   --   HGB 7.3*  < > 5.7* 7.1* 7.1* 8.8* 9.0* 8.8* 9.6*  HCT 23.0*  < > 17.1* 21.5* 21.2* 25.6* 26.7* 26.1* 28.2*  MCV 65.1*  < > 68.4* 68.7* 68.4*  --  70.1* 69.6* 71.2*  PLT 274  < > 289 219 212  --  222 215 248  < > = values in this interval not displayed. Cardiac Enzymes: No results for input(s): CKTOTAL, CKMB, CKMBINDEX, TROPONINI in the last 168 hours. BNP (last 3 results) No results for input(s): BNP in the last 8760 hours.  ProBNP (last 3 results) No results for input(s): PROBNP in the last 8760 hours.  CBG:  Recent Labs Lab 01/02/16 1959 01/03/16 0118 01/03/16 0509 01/03/16 0732 01/03/16 1150  GLUCAP 132* 144* 156* 150* 181*    Recent Results (from the past 240 hour(s))  Culture, blood (routine x 2)     Status: None (Preliminary result)   Collection Time: 12/30/15  8:04 PM  Result Value Ref Range Status   Specimen Description BLOOD RIGHT CHEST PORT  Final   Special Requests BOTTLES DRAWN AEROBIC AND ANAEROBIC 5CC EACH  Final   Culture   Final    NO GROWTH 2 DAYS Performed at [facility]    Report Status PENDING  Incomplete  Culture, blood (routine x 2)     Status: None (Preliminary result)   Collection Time: 12/30/15  8:43 PM  Result Value Ref Range Status   Specimen Description BLOOD LEFT WRIST  Final   Special Requests BOTTLES DRAWN AEROBIC AND ANAEROBIC 5CC EACH  Final   Culture   Final    NO GROWTH 2 DAYS Performed at [facility]     Report Status PENDING  Incomplete  Urine culture     Status: Abnormal   Collection Time: 12/30/15  9:50 PM  Result Value Ref Range Status   Specimen Description URINE, CLEAN CATCH  Final   Special Requests NONE  Final   Culture (A)  Final    1,000 COLONIES/mL INSIGNIFICANT GROWTH Performed at [facility]    Report Status 01/01/2016 FINAL  Final  MRSA PCR Screening     Status: Abnormal   Collection Time: 12/31/15  1:26 AM  Result Value Ref Range Status   MRSA by PCR POSITIVE (A) NEGATIVE Final    Comment:        The GeneXpert MRSA Assay (FDA approved for NASAL specimens only), is one component of a comprehensive MRSA colonization surveillance program. It is not intended to  diagnose MRSA infection nor to guide or monitor treatment for MRSA infections. RESULT CALLED TO, READ BACK BY AND VERIFIED WITH: K.DION,RN AT 0413 ON 12/31/15 BY W.SHEA      Studies: No results found.  Scheduled Meds: . sodium chloride   Intravenous Once  . acetaminophen  650 mg Oral Once  . antiseptic oral rinse  7 mL Mouth Rinse q12n4p  . doxepin  25 mg Oral 4 times per day  . enoxaparin (LOVENOX) injection  40 mg Subcutaneous Q24H  . feeding supplement (OSMOLITE 1.5 CAL)  1,000 mL Per Tube Q24H  . feeding supplement (OSMOLITE 1.5 CAL)  237 mL Per Tube TID  . fentaNYL  37.5 mcg Transdermal Q72H  . guaiFENesin  600 mg Oral 4 times per day  . magic mouthwash w/lidocaine  15 mL Oral QID  . metoCLOPramide  10 mg Oral TID AC & HS  . mupirocin ointment  1 application Nasal BID  . ondansetron (ZOFRAN) IV  8 mg Intravenous 3 times per day  . piperacillin-tazobactam (ZOSYN)  IV  3.375 g Intravenous Q8H  . sodium chloride flush  10-40 mL Intracatheter Q12H  . sodium chloride flush  3 mL Intravenous Q12H  . sucralfate  1 g Oral TID WC & HS  . vancomycin  750 mg Intravenous Q12H   Continuous Infusions: . 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 [facility] Pager [phone]. If 7PM-7AM, please contact night-coverage at [email or web address], password [email or web address] 01/03/2016, 12:36 PM  LOS: 4 days

## 2016-01-03 NOTE — Progress Notes (Signed)
Pharmacy Antibiotic Note  Debra Farrell is a 70 y.o. female who was seen at the [facility] for blood transfusion and was found to have an elevated temperature. After discharge, patient became lethargic at home and unable to follow commands. Patient presented to [facility] ED on 12/30/2015 with complaints of near syncope. Pharmacy has been consulted for Vancomycin and Zosyn dosing for suspected sepsis.      4/9: VT low at 6 (Goal 15-20), stable renal fxn  Plan: Reload with Vanc 1g IV x1, then '750mg'$  IV q12h Continue Zosyn 3.375g IV q8h (infuse over 4 hours) Monitor renal function, cultures, clinical course.  Height: '5\' 6"'$  (167.6 cm) Weight: 154 lb 8.7 oz (70.1 kg) IBW/kg (Calculated) : 59.3  Temp (24hrs), Avg:98.9 F (37.2 C), Min:98.4 F (36.9 C), Max:99.2 F (37.3 C)   Recent Labs Lab 12/30/15 2007 12/30/15 2016 12/30/15 2332 12/31/15 0130 01/01/16 0441 01/02/16 0450 01/03/16 0350 01/03/16 0930  WBC 9.7  --  5.8 5.1 3.8* 3.2* 3.7*  --   CREATININE 0.93  --   --  0.90 0.83 0.82 0.85  --   LATICACIDVEN  --  0.67  --   --   --   --   --   --   VANCOTROUGH  --   --   --   --   --   --   --  6*    Estimated Creatinine Clearance: 59.3 mL/min (by C-G formula based on Cr of 0.85).    Allergies  Allergen Reactions  . Morphine And Related Nausea And Vomiting   Antimicrobials this admission: 4/5 >> Vancomycin >> 4/5 >> Zosyn >>  Dose adjustments this admission: 4/9 trough at 0900 = 6 on Vanc '500mg'$  q12h  Microbiology results: 4/5 BCx x 2: ngtd 4/5 UCx: insignificant growth-final 4/6 Influenza PCR: neg 4/6 MRSA PCR: positive  Thank you for allowing pharmacy to be a part of this patient's care.  Ralene Bathe, PharmD, BCPS 01/03/2016, 10:23 AM  Pager: [phone]

## 2016-01-04 ENCOUNTER — Other Ambulatory Visit: Payer: Self-pay | Admitting: Hematology and Oncology

## 2016-01-04 ENCOUNTER — Ambulatory Visit
Admit: 2016-01-04 | Discharge: 2016-01-04 | Disposition: A | Discharge status: Home / Self Care | Payer: 59 | Attending: Radiation Oncology | Admitting: Radiation Oncology

## 2016-01-04 ENCOUNTER — Ambulatory Visit: Payer: Self-pay | Admitting: Hematology and Oncology

## 2016-01-04 ENCOUNTER — Telehealth: Payer: Self-pay

## 2016-01-04 ENCOUNTER — Ambulatory Visit: Payer: 59

## 2016-01-04 ENCOUNTER — Encounter: Payer: Self-pay | Admitting: Radiation Oncology

## 2016-01-04 ENCOUNTER — Encounter: Payer: Self-pay | Admitting: *Deleted

## 2016-01-04 VITALS — BP 157/84 | HR 90 | Temp 99.3°F | Wt 150.0 lb

## 2016-01-04 DIAGNOSIS — E440 Moderate protein-calorie malnutrition: Secondary | ICD-10-CM | POA: Insufficient documentation

## 2016-01-04 DIAGNOSIS — C01 Malignant neoplasm of base of tongue: Secondary | ICD-10-CM

## 2016-01-04 LAB — CBC WITH DIFFERENTIAL/PLATELET
BASOS ABS: 0 10*3/uL (ref 0.0–0.1)
BASOS PCT: 0 %
EOS ABS: 0.3 10*3/uL (ref 0.0–0.7)
Eosinophils Relative: 7 %
HEMATOCRIT: 25.6 % — AB (ref 36.0–46.0)
HEMOGLOBIN: 8.7 g/dL — AB (ref 12.0–15.0)
LYMPHS PCT: 6 %
Lymphs Abs: 0.2 10*3/uL — ABNORMAL LOW (ref 0.7–4.0)
MCH: 23.8 pg — ABNORMAL LOW (ref 26.0–34.0)
MCHC: 34 g/dL (ref 30.0–36.0)
MCV: 70.1 fL — ABNORMAL LOW (ref 78.0–100.0)
MONOS PCT: 15 %
Monocytes Absolute: 0.5 10*3/uL (ref 0.1–1.0)
NEUTROS ABS: 2.6 10*3/uL (ref 1.7–7.7)
NEUTROS PCT: 72 %
Platelets: 237 10*3/uL (ref 150–400)
RBC: 3.65 MIL/uL — ABNORMAL LOW (ref 3.87–5.11)
RDW: 23.7 % — ABNORMAL HIGH (ref 11.5–15.5)
WBC: 3.6 10*3/uL — ABNORMAL LOW (ref 4.0–10.5)

## 2016-01-04 LAB — BASIC METABOLIC PANEL
Anion gap: 8 (ref 5–15)
BUN: 13 mg/dL (ref 6–20)
CHLORIDE: 96 mmol/L — AB (ref 101–111)
CO2: 29 mmol/L (ref 22–32)
Calcium: 8.4 mg/dL — ABNORMAL LOW (ref 8.9–10.3)
Creatinine, Ser: 0.83 mg/dL (ref 0.44–1.00)
GFR calc non Af Amer: >60 mL/min (ref >60)
Glucose, Bld: 142 mg/dL — ABNORMAL HIGH (ref 65–99)
POTASSIUM: 3.3 mmol/L — AB (ref 3.5–5.1)
SODIUM: 133 mmol/L — AB (ref 135–145)

## 2016-01-04 LAB — GLUCOSE, CAPILLARY
GLUCOSE-CAPILLARY: 154 mg/dL — AB (ref 65–99)
GLUCOSE-CAPILLARY: 160 mg/dL — AB (ref 65–99)
GLUCOSE-CAPILLARY: 173 mg/dL — AB (ref 65–99)

## 2016-01-04 MED ORDER — LEVOFLOXACIN 25 MG/ML PO SOLN: 500.0000 mg | Freq: Every day | ORAL | Status: DC

## 2016-01-04 MED ORDER — PRO-STAT SUGAR FREE PO LIQD
30.0000 mL | Freq: Every day | ORAL | Status: DC
  Administered 2016-01-05: 30 mL
  Filled 2016-01-04 (×2): qty 30

## 2016-01-04 MED ORDER — AMOXICILLIN-POT CLAVULANATE 875-125 MG PO TABS
1.0000 | ORAL_TABLET | Freq: Two times a day (BID) | ORAL | Status: DC
  Administered 2016-01-04 – 2016-01-05 (×3): 1 via ORAL
  Filled 2016-01-04 (×4): qty 1

## 2016-01-04 MED ORDER — FREE WATER
75.0000 mL | Freq: Four times a day (QID) | Status: DC
  Administered 2016-01-04 – 2016-01-05 (×4): 75 mL

## 2016-01-04 MED ORDER — POTASSIUM CHLORIDE 20 MEQ/15ML (10%) PO SOLN
40.0000 meq | ORAL | Status: AC
  Administered 2016-01-04: 40 meq
  Filled 2016-01-04: qty 30

## 2016-01-04 NOTE — Progress Notes (Signed)
[facility] PROGRESS NOTE  CHENOA WARNECKE WUJ:[id number] DOB: 1946/04/17 DOA: 12/30/2015 PCP: Rodrigo Ran, MD  Assessment/Plan: #1 systemic inflammatory response syndrome/fevers Patient presented with fevers, odynophagia, noted to have a heart rate up to 100 with a respiratory rate of 32. Etiology of patient's fever is unknown. Chest x-ray was negative for any acute infiltrate. CT abdomen and pelvis with no source of infection noted with resolution of abscess. Urinalysis was nitrite negative leukocytes negative. Patient has been pancultured and results pending. Patient's fever curve trending down. discontinue IV Zosyn. Place on oral Augmentin and completed a course of antibiotic treatment.  #2 odynophagia secondary to mucositis secondary to radiation Patient with a mucositis the into difficulty swallowing and decreased oral intake leading to dehydration. Patient seems congested with secretions noted. Respiratory therapy has been performing suctioning. Continue Magic mouthwash with lidocaine 4 times daily as well as sucralfate. Doxepin swish and swallow has been ordered by palliative care as well as fentanyl patch. Oncology and palliative care following and appreciate input and recommendations.   #3 dehydration Secondary to poor oral intake secondary to mucositis. Saline lock IV fluids. Supportive care.  #4 severe anemia Likely secondary to chronic disease secondary to cancer. Patient status post transfusion of 3 units packed red blood cells. Hemoglobin currently at 8.7 today. Patient with no overt bleeding. Follow and transfuse for hemoglobin less than 7.  #5 hypomagnesemia/hypokalemia Secondary to GI losses. Replete.  #6 constipation Opoid induced. Patient stated had large bowel movements. Patient with loose stools which have improved since yesterday. Discontinued MiraLAX and Senokot-S and reglan.   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 Patient receiving radiation  treatments. Per oncology.  #8 hypertension Stable.   #9 prophylaxis SCDs for DVT prophylaxis.  Code Status: Full Family Communication: Updated patient and daughter at bedside. Disposition Plan: Home when medically stable, hopefully 1-2 days.   Consultants:  Oncology: Dr Dorna Leitz 12/31/2015  Palliative care: Dr.Anwar 12/31/2015  Procedures:  CT abdomen and pelvis 12/31/2015  Chest x-ray 12/30/2015  3 units packed red blood cells 12/30/2015--12/31/2015  Antibiotics:  IV vancomycin 12/30/2015>>>>>01/03/2016  IV Zosyn 12/30/2015>>>>> 01/04/2016  Oral Augmentin 01/04/2016  HPI/Subjective: Patient states swallow difficulty improving. Patient states feeling better. Patient stated multiple loose bowel movements improved significantly.   Objective: Filed Vitals:   01/03/16 2045 01/04/16 0520  BP: 158/64 150/67  Pulse: 92 89  Temp: 99 F (37.2 C) 99 F (37.2 C)  Resp: 22 20    Intake/Output Summary (Last 24 hours) at 01/04/16 1149 Last data filed at 01/04/16 0700  Gross per 24 hour  Intake 3207.25 ml  Output      0 ml  Net 3207.25 ml   Filed Weights   01/02/16 0628 01/03/16 0500 01/04/16 0520  Weight: 70.308 kg (155 lb) 70.1 kg (154 lb 8.7 oz) 68.085 kg (150 lb 1.6 oz)    Exam:   General:  NAD.   Cardiovascular: RRR  Respiratory: CTAB  Abdomen: Soft, nontender, nondistended, positive bowel sounds. PEG tube in place.   Musculoskeletal: No clubbing cyanosis or edema.  Data Reviewed: Basic Metabolic Panel:  Recent Labs Lab 12/30/15 0947  12/31/15 0130 01/01/16 0441 01/02/16 0450 01/03/16 0350 01/04/16 0345  NA 132*  < > 135 134* 133* 134* 133*  K 4.3  < > 3.6 2.8* 3.3* 3.5 3.3*  CL  --   < > 102 99* 96* 96* 96*  CO2 29  < > 25 26 27 29 29   GLUCOSE 160*  < >  114* 122* 146* 158* 142*  BUN 26.0  < > 22* 18 11 12 13   CREATININE 1.0  < > 0.90 0.83 0.82 0.85 0.83  CALCIUM 9.4  < > 8.5* 8.3* 8.2* 8.8* 8.4*  MG 1.6  --  1.5* 1.9  --   --   --    PHOS  --   --  3.0  --   --   --   --   < > = values in this interval not displayed. Liver Function Tests:  Recent Labs Lab 12/30/15 0947 12/30/15 2007 12/31/15 0130  AST 35* 31 27  ALT 38 36 31  ALKPHOS 98 87 73  BILITOT 0.80 1.2 1.2  PROT 6.7 6.3* 5.7*  ALBUMIN 3.1* 3.1* 2.7*   No results for input(s): LIPASE, AMYLASE in the last 168 hours. No results for input(s): AMMONIA in the last 168 hours. CBC:  Recent Labs Lab 12/30/15 0947 12/30/15 2007  12/31/15 0130 12/31/15 1230 01/01/16 0441 01/02/16 0450 01/03/16 0350 01/04/16 0345  WBC 7.0 9.7  < > 5.1  --  3.8* 3.2* 3.7* 3.6*  NEUTROABS 6.3 8.2*  --   --   --  2.9  --   --  2.6  HGB 7.3* 5.7*  < > 7.1* 8.8* 9.0* 8.8* 9.6* 8.7*  HCT 23.0* 17.1*  < > 21.2* 25.6* 26.7* 26.1* 28.2* 25.6*  MCV 65.1* 68.4*  < > 68.4*  --  70.1* 69.6* 71.2* 70.1*  PLT 274 289  < > 212  --  222 215 248 237  < > = values in this interval not displayed. Cardiac Enzymes: No results for input(s): CKTOTAL, CKMB, CKMBINDEX, TROPONINI in the last 168 hours. BNP (last 3 results) No results for input(s): BNP in the last 8760 hours.  ProBNP (last 3 results) No results for input(s): PROBNP in the last 8760 hours.  CBG:  Recent Labs Lab 01/03/16 1150 01/03/16 1634 01/03/16 2043 01/04/16 0013 01/04/16 0512  GLUCAP 181* 110* 134* 160* 173*    Recent Results (from the past 240 hour(s))  Culture, blood (routine x 2)     Status: None (Preliminary result)   Collection Time: 12/30/15  8:04 PM  Result Value Ref Range Status   Specimen Description BLOOD RIGHT CHEST PORT  Final   Special Requests BOTTLES DRAWN AEROBIC AND ANAEROBIC 5CC EACH  Final   Culture   Final    NO GROWTH 3 DAYS Performed at [facility]    Report Status PENDING  Incomplete  Culture, blood (routine x 2)     Status: None (Preliminary result)   Collection Time: 12/30/15  8:43 PM  Result Value Ref Range Status   Specimen Description BLOOD LEFT WRIST  Final    Special Requests BOTTLES DRAWN AEROBIC AND ANAEROBIC 5CC EACH  Final   Culture   Final    NO GROWTH 3 DAYS Performed at [facility]    Report Status PENDING  Incomplete  Urine culture     Status: Abnormal   Collection Time: 12/30/15  9:50 PM  Result Value Ref Range Status   Specimen Description URINE, CLEAN CATCH  Final   Special Requests NONE  Final   Culture (A)  Final    1,000 COLONIES/mL INSIGNIFICANT GROWTH Performed at [facility]    Report Status 01/01/2016 FINAL  Final  MRSA PCR Screening     Status: Abnormal   Collection Time: 12/31/15  1:26 AM  Result Value Ref Range Status  MRSA by PCR POSITIVE (A) NEGATIVE Final    Comment:        The GeneXpert MRSA Assay (FDA approved for NASAL specimens only), is one component of a comprehensive MRSA colonization surveillance program. It is not intended to diagnose MRSA infection nor to guide or monitor treatment for MRSA infections. RESULT CALLED TO, READ BACK BY AND VERIFIED WITH: K.DION,RN AT 0413 ON 12/31/15 BY W.SHEA      Studies: No results found.  Scheduled Meds: . sodium chloride   Intravenous Once  . acetaminophen  650 mg Oral Once  . amoxicillin-clavulanate  1 tablet Oral BID  . antiseptic oral rinse  7 mL Mouth Rinse q12n4p  . doxepin  25 mg Oral 4 times per day  . enoxaparin (LOVENOX) injection  40 mg Subcutaneous Q24H  . feeding supplement (OSMOLITE 1.5 CAL)  1,000 mL Per Tube Q24H  . feeding supplement (OSMOLITE 1.5 CAL)  237 mL Per Tube TID  . feeding supplement (PRO-STAT SUGAR FREE 64)  30 mL Per Tube Daily  . fentaNYL  37.5 mcg Transdermal Q72H  . free water  75 mL Per Tube QID  . guaiFENesin  600 mg Oral 4 times per day  . magic mouthwash w/lidocaine  15 mL Oral QID  . mupirocin ointment  1 application Nasal BID  . ondansetron (ZOFRAN) IV  8 mg Intravenous 3 times per day  . potassium chloride  40 mEq Per Tube Q4H  . sodium chloride flush  10-40 mL Intracatheter Q12H  . sodium  chloride flush  3 mL Intravenous Q12H  . sucralfate  1 g Oral TID WC & HS   Continuous Infusions: . 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 [facility] Pager [phone]. If 7PM-7AM, please contact night-coverage at [email or web address], password [facility] 01/04/2016, 11:49 AM  LOS: 5 days

## 2016-01-04 NOTE — Progress Notes (Signed)
Daily Progress Note   Patient Name: Debra Farrell       Date: 01/04/2016 DOB: April 01, 1946  Age: 70 y.o. 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   Subjective/interval events: Awake alert, is able to talk more, less symptom burden from secretions, trying the Doxepin rinse here and there.  Pain well controlled, continue fentanyl patch as well as IV Dilaudid PRN. She has used 3.5 mg IV Dilaudid total in the past 24 hours.  Awaiting radiation treatment today, appreciate Dr Colletta Maryland input and recommendations Has lung nodule, worrisome for metastatic disease, ?candidate for SBRT lung Discussed with Dr Bertis Ruddy on 4-7, appreciate input and recommendations.   goals remain full code, to continue life maintaining/life prolonging measures for now.  Nausea vomiting resolved, tolerating TF osmolite. Appreciate dietician input     Length of Stay: 5 days  Current Medications: Scheduled Meds:  . sodium chloride   Intravenous Once  . acetaminophen  650 mg Oral Once  . amoxicillin-clavulanate  1 tablet Oral BID  . antiseptic oral rinse  7 mL Mouth Rinse q12n4p  . doxepin  25 mg Oral 4 times per day  . enoxaparin (LOVENOX) injection  40 mg Subcutaneous Q24H  . feeding supplement (OSMOLITE 1.5 CAL)  1,000 mL Per Tube Q24H  . feeding supplement (OSMOLITE 1.5 CAL)  237 mL Per Tube TID  . fentaNYL  37.5 mcg Transdermal Q72H  . guaiFENesin  600 mg Oral 4 times per day  . magic mouthwash w/lidocaine  15 mL Oral QID  . mupirocin ointment  1 application Nasal BID  . ondansetron (ZOFRAN) IV  8 mg Intravenous 3 times  per day  . potassium chloride  40 mEq Per Tube Q4H  . sodium chloride flush  10-40 mL Intracatheter Q12H  . sodium chloride flush  3 mL Intravenous Q12H  . sucralfate  1 g Oral TID WC & HS    Continuous Infusions: . sodium chloride 75 mL/hr at 01/03/16 2146    PRN Meds: acetaminophen **OR** acetaminophen, bisacodyl, hydrocerin, HYDROmorphone (DILAUDID) injection, ondansetron **OR** ondansetron (ZOFRAN) IV, sodium chloride flush  Physical Exam: Physical Exam  Age appropriate appearing lady resting in bed Is able to talk more,  Less burden from secretions noted S1 S2 Clear anteriorly Abdomen soft, has PEG+ No edema Radiation related changes to chest area Awake alert non focal  Vital Signs: BP 150/67 mmHg  Pulse 89  Temp(Src) 99 F (37.2 C) (Oral)  Resp 20  Ht 5\' 6"  (1.676 m)  Wt 68.085 kg (150 lb 1.6 oz)  BMI 24.24 kg/m2  SpO2 97% SpO2: SpO2: 97 % O2 Device: O2 Device: Not Delivered O2 Flow Rate: O2 Flow Rate (L/min): 2 L/min  Intake/output summary:  Intake/Output Summary (Last 24 hours) at 01/04/16 0936 Last data filed at 01/04/16 0700  Gross per 24 hour  Intake 3407.25 ml  Output      0 ml  Net 3407.25 ml   LBM: Last BM Date: 01/03/16 Baseline Weight: Weight: 68.947 kg (152 lb) Most recent weight: Weight: 68.085 kg (150 lb 1.6 oz)       Palliative Assessment/Data: Flowsheet Rows        Most Recent Value   Intake Tab    Referral Department  Hospitalist   Unit at Time of Referral  Med/Surg Unit   Palliative Care Primary Diagnosis  Cancer   Date Notified  12/31/15   Palliative Care Type  Return patient Palliative Care   Reason for referral  Non-pain Symptom   Date of Admission  12/30/15   Date first seen by Palliative Care  12/31/15   # of days Palliative referral response time  0 Day(s)   # of days IP prior to Palliative referral  1   Clinical Assessment    Palliative Performance Scale Score  40%   Pain Max last 24 hours  4   Pain Min Last  24 hours  3   Dyspnea Max Last 24 Hours  4   Dyspnea Min Last 24 hours  3   Psychosocial & Spiritual Assessment    Palliative Care Outcomes    Patient/Family meeting held?  Yes   Who was at the meeting?  patient daughter    Palliative Care Outcomes  Clarified goals of care   Palliative Care follow-up planned  No   Palliative Care Follow-up Reason  Non-pain symptom, Clarify goals of care      Additional Data Reviewed: CBC    Component Value Date/Time   WBC 3.6* 01/04/2016 0345   WBC 7.0 12/30/2015 0947   RBC 3.65* 01/04/2016 0345   RBC 3.08* 12/30/2015 2220   RBC 3.54* 12/30/2015 0947   HGB 8.7* 01/04/2016 0345   HGB 7.3* 12/30/2015 0947   HCT 25.6* 01/04/2016 0345   HCT 23.0* 12/30/2015 0947   PLT 237 01/04/2016 0345   PLT 274 12/30/2015 0947   MCV 70.1* 01/04/2016 0345   MCV 65.1* 12/30/2015 0947   MCH 23.8* 01/04/2016 0345   MCH 20.6* 12/30/2015 0947   MCHC 34.0 01/04/2016 0345   MCHC 31.7 12/30/2015 0947   RDW 23.7* 01/04/2016 0345   RDW 23.7* 12/30/2015 0947   LYMPHSABS 0.2* 01/04/2016 0345   LYMPHSABS 0.1* 12/30/2015 0947   MONOABS 0.5 01/04/2016 0345   MONOABS 0.5 12/30/2015 0947   EOSABS 0.3 01/04/2016 0345   EOSABS 0.0 12/30/2015 0947   BASOSABS 0.0 01/04/2016 0345   BASOSABS 0.0 12/30/2015 0947    CMP     Component Value Date/Time   NA 133* 01/04/2016 0345   NA 132* 12/30/2015 0947   K 3.3* 01/04/2016 0345   K 4.3 12/30/2015 0947  CL 96* 01/04/2016 0345   CO2 29 01/04/2016 0345   CO2 29 12/30/2015 0947   GLUCOSE 142* 01/04/2016 0345   GLUCOSE 160* 12/30/2015 0947   BUN 13 01/04/2016 0345   BUN 26.0 12/30/2015 0947   CREATININE 0.83 01/04/2016 0345   CREATININE 1.0 12/30/2015 0947   CREATININE 1.04 04/07/2015 1536   CALCIUM 8.4* 01/04/2016 0345   CALCIUM 9.4 12/30/2015 0947   PROT 5.7* 12/31/2015 0130   PROT 6.7 12/30/2015 0947   ALBUMIN 2.7* 12/31/2015 0130   ALBUMIN 3.1* 12/30/2015 0947   AST 27 12/31/2015 0130   AST 35* 12/30/2015 0947    ALT 31 12/31/2015 0130   ALT 38 12/30/2015 0947   ALKPHOS 73 12/31/2015 0130   ALKPHOS 98 12/30/2015 0947   BILITOT 1.2 12/31/2015 0130   BILITOT 0.80 12/30/2015 0947   GFRNONAA >60 01/04/2016 0345   GFRAA >60 01/04/2016 0345       Problem List:  Patient Active Problem List   Diagnosis Date Noted  . Goals of care, counseling/discussion   . Dysphagia 12/31/2015  . 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 Encounter for palliative care   . Odynophagia   . SIRS (systemic inflammatory response syndrome) (HCC) 12/30/2015  . Sepsis (HCC) 12/30/2015  . Throat pain in adult 12/29/2015  . 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 Nausea without vomiting 12/16/2015  . Anemia in chronic illness 12/16/2015  . Neutropenic fever (HCC)   . Abdominal pain   . Abscess   . Anemia due to other cause   . Renal failure   . Fever   . Hypophosphatemia 11/21/2015  . Acute renal failure (ARF) (HCC)   . Dehydration   . Hypokalemia   . Hypomagnesemia   . Severe protein-calorie malnutrition (HCC) 11/14/2015  . Intractable nausea and vomiting 11/13/2015  . Gastritis, acute 11/13/2015  . Pain at surgical incision 11/10/2015  . Constipation due to opioid therapy 11/10/2015  . 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  . Skin lesion of back 03/21/2013  . Lipoma of axilla 03/21/2013  . Sleep disorder 03/17/2013  . Skin lesion 03/17/2013  . Health care maintenance 03/17/2013  . Right knee pain 04/05/2012  . Health maintenance examination 10/17/2011  . 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 Essential hypertension 10/17/2011  . Hyperlipidemia 10/17/2011  . Overweight(278.02) 10/17/2011  . Headache(784.0) 10/17/2011     Palliative Care Assessment & Plan    1.Code Status:  Full code    Code Status Orders        Start     Ordered   12/31/15 0057  Full code   Continuous     12/31/15 0057    Code Status History    Date Active Date Inactive Code Status Order ID Comments User Context    11/13/2015  1:10 PM 11/30/2015  2:21 PM Full Code [id number]  Artis Delay, MD Inpatient   11/05/2015  2:49 PM 11/06/2015  3:24 AM Full Code [id number]  Oley Balm, MD [facility]   11/05/2015  2:49 PM 11/05/2015  2:49 PM Full Code [id number]  Oley Balm, MD [facility]    Advance Directive Documentation        Most Recent Value   Type of Advance Directive  Healthcare Power of Attorney, Living will   Pre-existing out of facility DNR order (yellow form or pink MOST form)     "MOST" Form in Place?         2. Goals of Care/Additional Recommendations:  Limitations on Scope of  Treatment: Full Scope Treatment for now.   Desire for further Chaplaincy support:no  Psycho-social Needs: Caregiving  Support/Resources  3. Symptom Management:      1. As above   4. Palliative Prophylaxis:   Bowel Regimen  5. Prognosis: Unable to determine but appears guarded due to advanced disease and symptom burden.   6. Discharge Planning:  pending hospital course    Care plan was discussed with  Patient, daughter.   Thank you for allowing the Palliative Medicine Team to assist in the care of this patient.   Time In: 9 Time Out: 925 Total Time 25 Prolonged Time Billed  no        [id number] Rosalin Hawking, MD  01/04/2016, 9:36 AM  Please contact Palliative Medicine Team phone at [phone] for questions and concerns.

## 2016-01-04 NOTE — Progress Notes (Signed)
[facility] Therapy Treatment Record Phone [phone]   Radiation Therapy was administered to Debra Farrell on: 01/04/2016  11:15 AM and was treatment # 34 out of a planned course of 35 treatments.  Radiation Treatment  1). Beam photons with 6-10 energy  2). Brachytherapy None  3). Stereotactic Radiosurgery None  4). Other Radiation None     Elby Beck

## 2016-01-04 NOTE — Telephone Encounter (Signed)
I called and spoke with Ms. Rochel Brome nurse, Susie. She stated Ms. Kimm was ready to come for radiation today. I let her know that Dr. Isidore Moos would like to speak to her before her treatment today, and that a transporter would pick Ms. Cureton up about 10:00 to come down to radiation. She stated she would let Ms. Byer know and call me if anything changes.

## 2016-01-04 NOTE — Progress Notes (Signed)
Debra Farrell   DOB:1946-08-20   XB#:[id number]    Subjective: She felt better. She continues to have productive cough. She had diarrhea alternates with constipation. The pain appears to be under control. Fever had resolved. Cultures were negative.  Objective:  Filed Vitals:   01/03/16 2045 01/04/16 0520  BP: 158/64 150/67  Pulse: 92 89  Temp: 99 F (37.2 C) 99 F (37.2 C)  Resp: 22 20     Intake/Output Summary (Last 24 hours) at 01/04/16 0917 Last data filed at 01/04/16 0700  Gross per 24 hour  Intake 3407.25 ml  Output      0 ml  Net 3407.25 ml    GENERAL:alert, no distress and comfortable SKIN: Skin ulcer is healing. EYES: normal, Conjunctiva are pink and non-injected, sclera clear OROPHARYNX:no exudate, no erythema and lips, buccal mucosa, and tongue normal  NECK: She has lymphadenopathy in the neck, persistent.  LYMPH:  no palpable lymphadenopathy in the cervical, axillary or inguinal LUNGS: clear to auscultation and percussion with normal breathing effort HEART: regular rate & rhythm and no murmurs and no lower extremity edema ABDOMEN:abdomen soft, non-tender and normal bowel sounds. Feeding tube site looks okay Musculoskeletal:no cyanosis of digits and no clubbing  NEURO: alert & oriented x 3 with fluent speech, no focal motor/sensory deficits   Labs:  Lab Results  Component Value Date   WBC 3.6* 01/04/2016   HGB 8.7* 01/04/2016   HCT 25.6* 01/04/2016   MCV 70.1* 01/04/2016   PLT 237 01/04/2016   NEUTROABS 2.6 01/04/2016    Lab Results  Component Value Date   NA 133* 01/04/2016   K 3.3* 01/04/2016   CL 96* 01/04/2016   CO2 29 01/04/2016    Assessment & Plan:   Cancer of base of tongue ([place]) The patient has experienced significant decline since the last time I saw her. Continue aggressive supportive care. I would defer to the radiation oncologist to determine further radiation therapy  Throat pain in adult She felt better on increased dose fentanyl  patch 37.5 g. Appreciate assistance from palliative care team for pain management  Constipation due to opioid therapy, resolved Continue laxative management.  Intractable nausea and vomiting Mucus gagging She has severe intractable nausea and vomiting likely multifactorial from mucous secretion, constipation and delayed gastric clearance from nutritional supplements. I recommend IV fluid hydration therapy and IV antiemetics on a regular basis. This is improved.  Severe protein-calorie malnutrition ([place]) She is not able to tolerate her nutritional supplement due to uncontrolled nausea and vomiting. 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 She is using topical emollient cream.  Intermittent confusion, resolved  Recent MRSA infection, recurrent fever I agree with broad-spectrum IV antibiotics. Cultures are negative.  Enlarging lung nodule Worrisome for metastatic cancer  Severe anemia, anemia of chronic disease She had received blood transfusions Currently asymptomatic Observe closely  Severe hypokalemia Due to potassium wasting from Zosyn and poor oral intake Continue replacement therapy  CODE STATUS Goals of care Discharge planning The patient is not ready for discharge due to unresolving issues I will continue to follow, hopefully she will be ready for discharge within the next 24-48 hours [place], Price Lach[place]elle, MD 01/04/2016  9:17 AM

## 2016-01-04 NOTE — Progress Notes (Signed)
C01 Base of tongue cancer Weekly Management Note:  Inpatient    Current Dose:  68 Gy  Projected Dose: 70 Gy   Narrative:  The patient presents for routine under treatment assessment.  CBCT/MVCT images/Port film x-rays were reviewed.  The chart was checked.    She rates her pain a 7/10 in her throat. She has 37.5 mcg Fentayl on (25 and 12.5 patch). She is occasionally taking .5 mg of dilaudid IV for breakthrough pain. She is receiving continuous feeds through her PEG at night at 60 cc/hr and 3 bolus feeds during the day. She is also receiving some free water through her tube.  Dysphagia with liquids and solids, swallows a little water.     Physical Findings:  Wt Readings from Last 3 Encounters:  01/04/16 150 lb 1.6 oz (68.085 kg)  01/04/16 150 lb (68.04 kg)  12/29/15 149 lb 6.4 oz (67.767 kg)    height is '5\' 6"'$  (1.676 m) and weight is 150 lb 1.6 oz (68.085 kg). Her oral temperature is 99.2 F (37.3 C). Her blood pressure is 156/64 and her pulse is 88. Her respiration is 20 and oxygen saturation is 96%.    skin is dry with  hyperpigmentation over neck. + right neck mass;  no thrush In wheelchair   CBC    Component Value Date/Time   WBC 3.6* 01/04/2016 0345   WBC 7.0 12/30/2015 0947   RBC 3.65* 01/04/2016 0345   RBC 3.08* 12/30/2015 2220   RBC 3.54* 12/30/2015 0947   HGB 8.7* 01/04/2016 0345   HGB 7.3* 12/30/2015 0947   HCT 25.6* 01/04/2016 0345   HCT 23.0* 12/30/2015 0947   PLT 237 01/04/2016 0345   PLT 274 12/30/2015 0947   MCV 70.1* 01/04/2016 0345   MCV 65.1* 12/30/2015 0947   MCH 23.8* 01/04/2016 0345   MCH 20.6* 12/30/2015 0947   MCHC 34.0 01/04/2016 0345   MCHC 31.7 12/30/2015 0947   RDW 23.7* 01/04/2016 0345   RDW 23.7* 12/30/2015 0947   LYMPHSABS 0.2* 01/04/2016 0345   LYMPHSABS 0.1* 12/30/2015 0947   MONOABS 0.5 01/04/2016 0345   MONOABS 0.5 12/30/2015 0947   EOSABS 0.3 01/04/2016 0345   EOSABS 0.0 12/30/2015 0947   BASOSABS 0.0 01/04/2016 0345   BASOSABS 0.0 12/30/2015 0947     CMP     Component Value Date/Time   NA 133* 01/04/2016 0345   NA 132* 12/30/2015 0947   K 3.3* 01/04/2016 0345   K 4.3 12/30/2015 0947   CL 96* 01/04/2016 0345   CO2 29 01/04/2016 0345   CO2 29 12/30/2015 0947   GLUCOSE 142* 01/04/2016 0345   GLUCOSE 160* 12/30/2015 0947   BUN 13 01/04/2016 0345   BUN 26.0 12/30/2015 0947   CREATININE 0.83 01/04/2016 0345   CREATININE 1.0 12/30/2015 0947   CREATININE 1.04 04/07/2015 1536   CALCIUM 8.4* 01/04/2016 0345   CALCIUM 9.4 12/30/2015 0947   PROT 5.7* 12/31/2015 0130   PROT 6.7 12/30/2015 0947   ALBUMIN 2.7* 12/31/2015 0130   ALBUMIN 3.1* 12/30/2015 0947   AST 27 12/31/2015 0130   AST 35* 12/30/2015 0947   ALT 31 12/31/2015 0130   ALT 38 12/30/2015 0947   ALKPHOS 73 12/31/2015 0130   ALKPHOS 98 12/30/2015 0947   BILITOT 1.2 12/31/2015 0130   BILITOT 0.80 12/30/2015 0947   GFRNONAA >60 01/04/2016 0345   GFRAA >60 01/04/2016 0345     Impression:  The patient is tolerating radiotherapy.   Plan:  Continue radiotherapy as planned.  Note sent to palliative medicine and Dr Alvy Bimler last week.  I recommended finishing RT  in spite of lung nodule recently noted to be growing at lung base. This can be worked up when she is more stable. Recommend SLP consult as inpatient to help with dysphagia. Continue pain meds and lidocaine PRN for odynophagia.Marland Kitchen     -----------------------------------  [name], MD

## 2016-01-04 NOTE — Progress Notes (Signed)
Nutrition Follow-up  DOCUMENTATION CODES:   Non-severe (moderate) malnutrition in context of acute illness/injury  INTERVENTION:  - Continue Osmolite 1.5 @ 60 mL/hr from 1900-0700 and 1 can Osmolite 1.5 TID (1000,1300,1600) - Will order 75 mL free water every 3 hours while continuous TF infusing. This will provide 300 mL free water.  - Will order 30 mL Prostat once/day, this supplement provides 100 kcal and 15 grams of protein. - RD will continue to monitor for additional needs  NUTRITION DIAGNOSIS:   Increased nutrient needs related to catabolic illness, cancer and cancer related treatments as evidenced by estimated needs. -ongoing  GOAL:   Patient will meet greater than or equal to 90% of their needs -met with current TF regimen  MONITOR:   PO intake, TF tolerance, Weight trends, Labs, I & O's  ASSESSMENT:   70 y.o. female has a past medical history of Hypertension; Hyperlipidemia; Eczema; Thalassemia minor; Cancer of base of tongue ([place]) (10/14/2015); Laceration (04/2014); Intractable nausea and vomiting (11/13/2015); Abdominal abscess ([place]) (11/30/2015); Infection with methicillin-resistant Staphylococcus aureus (MRSA) (11/30/2015); Nausea without vomiting (12/16/2015); Anemia in chronic illness (12/16/2015); and Throat pain in adult (12/29/2015). Presented with febrile episode after receiving Dulcolax suppository. For the past few days patient had had severe mucositis throat and mouth pain difficulty swallowing nausea and vomiting and have had severe constipation for past 5 days. Patient has not been able to eat well and not able to tolerate her nutritional supplements. She has nutritional supplement through feeding pump but haven't been able to keep that down. She was seen at [place] today was noted to have hemoglobin of 7.3. On febrile up to 101.3  4/10 Pt transported to radiation prior to visit. Pt's diet advanced to CLD at 0758 today. Pt currently ordered goal TF: Osmolite 1.5 @ 60  mL/hr from 1900-0700 and 1 can Osmolite 1.5 TID (1000,1300,1600). IVF decreased since 4/7 so will order free water flush as outlined above and continue to monitor for need for further adjustment. Current TF regimen is providing 2146 kcal, 90 grams protein (87% minimum estimated protein needs), and 1392 mL free water. Will order Prostat once/day which will provide additional 100 kcal and 15 grams of protein. Will also order free water flush as outlined above. Pt now meets criteria for malnutrition based on findings of physical assessment.   Pt to meet 100% estimated needs with updated TF regimen. Per chart review, pt weight down from 154 lbs to 150 lbs since yesterday (2.5% body weight); will continue to monitor weight trends. IVF: NS @ 50 mL/hr. Medications reviewed; 1 gram Carafate TID. Labs reviewed; CBGs: 110-181 mg/dL, Na: 133 mmol/L, Cl: 96 mmol/L, Ca: 8.4 mg/dL.    4/9 - Checked on pt today and was able to perform physical exam.  - She has no complaints about tubefeed; no nausea or aspiration. - Nutrition-Focused physical exam completed. Findings are moderate fat depletion, mild-modearte muscle depletion, and no edema.  - Plan for today: Will increase continuous Osmolite 1.5 to goal rate, 67m/hr and monitor for tolerance. - Palliative care followed up with patient -> remains a full code. Constipation has resolved. She continues to use suction for secretions -> managing ok at this point.  4/7 - Pt sleeping at time of RD visit and no family/visitors present.  - Spoke with RN who reports that pt received Osmolite 1.5 @ 30 mL/hr from 1900-0700 with no reported issues.  - She attempted to give pt TF bolus as ordered at 1000 today but pt  was coughing and only 1/2 can was able to be infused related to this.  - Unable to complete physical assessment this visit related to pt comfort; will again attempt at follow-up.  - Will increase TF to Osmolite 1.5 @ 45 mL/hr from 1900-0700 and continue 1 can  Osmolite 1.5 TID.  - This regimen will provide 1876 kcal (91% minimum estimated kcal), 79 grams of protein (77% minimum estimated protein needs), and 954 mL free water. - Per notes, Palliative care to follow-up with pt today. -  If pt able to tolerate increased continuous TF rate, will increase to Osmolite 1.5 @ 60 mL/hr from 1900-0700 on 4/9 and also continue current bolus feeds.  - This is goal rate for TF regimen.   4/6 - Pt with PEG and was last seen by [place] on 12/29/15 with the following plan at that time:  Will change tube feeding regimen to combination of continuous feedings and bolus feedings. Patient will use Osmolite 1.5 at 60 mL an hour for 12 hours daily from 7 PM to 7 AM. Patient will give 1 can bolus feedings at 9 AM, 12 PM in 3 PM. Patient will continue free water flushes of 60 cc free water before and after bolus feeding and continuous feeding. Patient should flush feeding tube with an additional 600 cc free water daily to meet minimum free water needs.  - Spoke with pt's daughter at bedside who provided all information.  - TF pump was received at home yesterday but plan per [facility] RD was unable to be started due to need for admission.  - Daughter is interested in starting this TF regimen during admission in order to assess tolerance.  - Previously, pt was tolerating 2 cans Osmolite 1.5 TID but was having excessive fullness and delayed gastric emptying which was causing associated N/V; Reglan was added and was helping.  - Daughter reports that mucositis has been ongoing for ~1 week but has worsened since 12/25/15 with pt unable to tolerate even water.  - Due to this all medications were being given via PEG per pt preference. MD alerted to this.  - Unable to complete physical assessment during this visit but will do so tomorrow is able. - Daughter reports that pt's weight has been trending up with TF and that pt's strength has been increasing. - Per chart review,  weight since 12/09/15 has been fluctuating between 146-152 lbs; will continue to closely monitor weight trends during admission.      Diet Order:  Diet clear liquid Room service appropriate?: Yes; Fluid consistency:: Thin  Skin:  Reviewed, no issues  Last BM:  4/9  Height:   Ht Readings from Last 1 Encounters:  12/31/15 '5\' 6"'  (1.676 m)    Weight:   Wt Readings from Last 1 Encounters:  01/04/16 150 lb 1.6 oz (68.085 kg)    Ideal Body Weight:  59.09 kg (kg)  BMI:  Body mass index is 24.24 kg/(m^2).  Estimated Nutritional Needs:   Kcal:  2070-2275 (30-33 kcal/kg)  Protein:  103-117 grams (1.5-1.7 grams/kg)  Fluid:  >/= 2 L/day  EDUCATION NEEDS:   No education needs identified at this time    Jarome Matin, RD, LDN Inpatient Clinical Dietitian Pager # [phone] After hours/weekend pager # [phone]

## 2016-01-04 NOTE — Progress Notes (Signed)
Ms. Sortino presents for her 34th fraction of radiation to her Base of Tongue and Bilateral Neck. She rates her pain a 7/10 in her throat. She has 37.5 mcg Fentayl on (25 and 12.5 patch). She is occasionally taking .5 mg of dilaudid IV for breakthrough pain. She is receiving continuous feeds through her PEG at night at 60 cc/hr and 3 bolus feeds during the day. She is also receiving some free water through her tube. Her neck is red, tender, but much improved from previous visits. She is using Eucerin cream to her neck at this time twice daily. She is having loose bowel movements related to antibiotics and laxatives, with her last stool being yesterday.  BP 157/84 mmHg  Pulse 90  Temp(Src) 99.3 F (37.4 C)  Wt 150 lb (68.04 kg)  SpO2 97%   Wt Readings from Last 3 Encounters:  01/04/16 150 lb 1.6 oz (68.085 kg)  01/04/16 150 lb (68.04 kg)  12/29/15 149 lb 6.4 oz (67.767 kg)

## 2016-01-05 ENCOUNTER — Encounter: Payer: Self-pay | Admitting: Radiation Oncology

## 2016-01-05 ENCOUNTER — Other Ambulatory Visit: Payer: Self-pay | Admitting: Hematology and Oncology

## 2016-01-05 ENCOUNTER — Telehealth: Payer: Self-pay | Admitting: Nutrition

## 2016-01-05 ENCOUNTER — Encounter: Payer: Self-pay | Admitting: *Deleted

## 2016-01-05 ENCOUNTER — Ambulatory Visit: Payer: Self-pay

## 2016-01-05 ENCOUNTER — Telehealth: Payer: Self-pay | Admitting: Hematology and Oncology

## 2016-01-05 ENCOUNTER — Ambulatory Visit
Admit: 2016-01-05 | Discharge: 2016-01-05 | Disposition: A | Discharge status: Home / Self Care | Payer: 59 | Attending: Radiation Oncology | Admitting: Radiation Oncology

## 2016-01-05 DIAGNOSIS — D563 Thalassemia minor: Secondary | ICD-10-CM | POA: Diagnosis not present

## 2016-01-05 DIAGNOSIS — Z809 Family history of malignant neoplasm, unspecified: Secondary | ICD-10-CM | POA: Diagnosis not present

## 2016-01-05 DIAGNOSIS — Z510 Encounter for antineoplastic radiation therapy: Secondary | ICD-10-CM | POA: Diagnosis not present

## 2016-01-05 DIAGNOSIS — I10 Essential (primary) hypertension: Secondary | ICD-10-CM | POA: Diagnosis not present

## 2016-01-05 DIAGNOSIS — L309 Dermatitis, unspecified: Secondary | ICD-10-CM | POA: Diagnosis not present

## 2016-01-05 DIAGNOSIS — R070 Pain in throat: Secondary | ICD-10-CM

## 2016-01-05 DIAGNOSIS — D6489 Other specified anemias: Secondary | ICD-10-CM

## 2016-01-05 DIAGNOSIS — E785 Hyperlipidemia, unspecified: Secondary | ICD-10-CM | POA: Diagnosis not present

## 2016-01-05 DIAGNOSIS — K1233 Oral mucositis (ulcerative) due to radiation: Secondary | ICD-10-CM

## 2016-01-05 DIAGNOSIS — T402X5A Adverse effect of other opioids, initial encounter: Secondary | ICD-10-CM

## 2016-01-05 DIAGNOSIS — C01 Malignant neoplasm of base of tongue: Secondary | ICD-10-CM | POA: Diagnosis not present

## 2016-01-05 DIAGNOSIS — D568 Other thalassemias: Secondary | ICD-10-CM

## 2016-01-05 LAB — BASIC METABOLIC PANEL
ANION GAP: 8 (ref 5–15)
BUN: 13 mg/dL (ref 6–20)
CALCIUM: 8.8 mg/dL — AB (ref 8.9–10.3)
CO2: 30 mmol/L (ref 22–32)
CREATININE: 0.76 mg/dL (ref 0.44–1.00)
Chloride: 94 mmol/L — ABNORMAL LOW (ref 101–111)
GFR calc Af Amer: >60 mL/min (ref >60)
GLUCOSE: 172 mg/dL — AB (ref 65–99)
Potassium: 3.4 mmol/L — ABNORMAL LOW (ref 3.5–5.1)
Sodium: 132 mmol/L — ABNORMAL LOW (ref 135–145)

## 2016-01-05 LAB — CBC
HCT: 26.2 % — ABNORMAL LOW (ref 36.0–46.0)
Hemoglobin: 8.8 g/dL — ABNORMAL LOW (ref 12.0–15.0)
MCH: 23.3 pg — ABNORMAL LOW (ref 26.0–34.0)
MCHC: 33.6 g/dL (ref 30.0–36.0)
MCV: 69.5 fL — AB (ref 78.0–100.0)
PLATELETS: 278 10*3/uL (ref 150–400)
RBC: 3.77 MIL/uL — ABNORMAL LOW (ref 3.87–5.11)
WBC: 3.4 10*3/uL — AB (ref 4.0–10.5)

## 2016-01-05 LAB — CULTURE, BLOOD (ROUTINE X 2)
CULTURE: NO GROWTH
Culture: NO GROWTH

## 2016-01-05 LAB — GLUCOSE, CAPILLARY
GLUCOSE-CAPILLARY: 139 mg/dL — AB (ref 65–99)
Glucose-Capillary: 138 mg/dL — ABNORMAL HIGH (ref 65–99)
Glucose-Capillary: 171 mg/dL — ABNORMAL HIGH (ref 65–99)
Glucose-Capillary: 154 mg/dL — ABNORMAL HIGH (ref 65–99)

## 2016-01-05 MED ORDER — AMOXICILLIN-POT CLAVULANATE 875-125 MG PO TABS: 1.0000 | ORAL_TABLET | Freq: Two times a day (BID) | ORAL | Status: DC

## 2016-01-05 MED ORDER — POTASSIUM CHLORIDE 20 MEQ/15ML (10%) PO SOLN
40.0000 meq | ORAL | Status: AC
  Administered 2016-01-05 (×2): 40 meq
  Filled 2016-01-05 (×2): qty 30

## 2016-01-05 MED ORDER — PRO-STAT SUGAR FREE PO LIQD: 30.0000 mL | Freq: Every day | ORAL | Status: DC

## 2016-01-05 MED ORDER — DOXEPIN HCL 25 MG PO CAPS: 25.0000 mg | ORAL_CAPSULE | Freq: Four times a day (QID) | ORAL | Status: DC

## 2016-01-05 MED ORDER — HEPARIN SOD (PORK) LOCK FLUSH 100 UNIT/ML IV SOLN
500.0000 [IU] | INTRAVENOUS | Status: AC | PRN
  Administered 2016-01-05: 500 [IU]

## 2016-01-05 MED ORDER — FENTANYL 12 MCG/HR TD PT72: 12.5000 ug | MEDICATED_PATCH | TRANSDERMAL | Status: DC

## 2016-01-05 MED ORDER — FREE WATER: 100.0000 mL | Freq: Four times a day (QID) | Status: DC

## 2016-01-05 MED ORDER — POTASSIUM CHLORIDE 20 MEQ PO PACK: 40.0000 meq | PACK | ORAL | Status: DC

## 2016-01-05 MED ORDER — GUAIFENESIN 100 MG/5ML PO SOLN: 1200.0000 mg | Freq: Two times a day (BID) | ORAL | Status: DC

## 2016-01-05 MED ORDER — FENTANYL 12 MCG/HR TD PT72: 37.5000 ug | MEDICATED_PATCH | TRANSDERMAL | Status: DC

## 2016-01-05 MED FILL — fentaNYL 12 MCG/HR PT72: 12 | 15 days supply | Qty: 5 | Fill #0

## 2016-01-05 MED FILL — DOXEPIN 25 MG CAPSULE: 25 | 7 days supply | Qty: 30 | Fill #0

## 2016-01-05 MED FILL — AMOX-CLAV 875-125 MG TABLET: 875-125 | 2 days supply | Qty: 4 | Fill #0

## 2016-01-05 NOTE — Telephone Encounter (Signed)
Received call from [facility], patient's daughter regarding patient's free water needs after discharge. Patient is scheduled to give Osmolite 1.5 at 60 mL for 12 hours overnight from 7 pm-7 am plus one can Osmolite 1.5 TID. Also give 45 mL promod once daily. Educated to give 60 cc free water before and after continuous and bolus feedings plus additional 720 cc water by mouth or tube. TF, promod, and free water flushes provides ~2280 kcal, 104 grams protein, 2286 mL free water meeting 100% estimated needs. Patient is scheduled to get daily IVF at the cancer center to meet additional free water requirements. Nutrition follow up is scheduled for Wednesday, April 19 during IVF.

## 2016-01-05 NOTE — Progress Notes (Signed)
[facility] Therapy Treatment Record Phone [phone]   Radiation Therapy was administered to Debra Farrell on: 01/05/2016  2:42 PM and was treatment # 35 out of a planned course of 35 treatments.  Radiation Treatment  1). Beam photons with 6-10 energy  2). Brachytherapy None  3). Stereotactic Radiosurgery None  4). Other Radiation None     Elby Beck

## 2016-01-05 NOTE — Discharge Summary (Signed)
Physician Discharge Summary  Debra Farrell:[id number] DOB: 10-12-1945 DOA: 12/30/2015  PCP: Rodrigo Ran, MD  Admit date: 12/30/2015 Discharge date: 01/05/2016  Time spent: 65 minutes  Recommendations for Outpatient Follow-up:  1. Follow-up with Dr. Bertis Ruddy of oncology as scheduled. Patient will need a CBC done to follow-up on her counts as well as a basic metabolic profile done to follow-up on electrolytes and renal function.   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 Continue tube feeds.  Filed Weights   01/03/16 0500 01/04/16 0520 01/05/16 0439  Weight: 70.1 kg (154 lb 8.7 oz) 68.085 kg (150 lb 1.6 oz) 67.858 kg (149 lb 9.6 oz)    History of present illness:  Per Dr Benjamine Sprague is a 70 y.o. female   has a past medical history of Hypertension; Hyperlipidemia; Eczema; Thalassemia minor; Cancer of base of tongue (HCC) (10/14/2015); Laceration (04/2014); Intractable nausea and vomiting (11/13/2015); Abdominal abscess (HCC) (11/30/2015); Infection with methicillin-resistant Staphylococcus aureus (MRSA) (11/30/2015); Nausea without vomiting (12/16/2015); Anemia in chronic illness (12/16/2015); and Throat pain in adult (12/29/2015).   Presented with febrile episode after receiving Dulcolax suppository. For the past few days patient had had severe mucositis throat and mouth pain difficulty swallowing nausea and vomiting and have had severe constipation for past 5 days.  She's been taking Percocet for her pain and tried to use laxative. Patient has not been able to eat well and not able to tolerate her nutritional supplements. She has nutritional supplement through feeding pump but haven't been able to keep that down. Recently her port had malfunctioned apparently had flipped upside down and she required visit to interventional radiology where it was manipulated back in the position. Currently supposed to be working. She was seen at cancer Center on the day of admission was noted to have hemoglobin of 7.3. Patient  febrile up to 101.3 Patient was given IV Dilaudid for pain and she was given 1 unit of packed red blood cells at the time of completion temperature down to 100.3. She went home and was attempting to use a suppository her daughter stated patient became more more lethargic had significant chills. EMS was called oxygen saturation was 90% on arrival heart rate 120 fever up to 104.2.   IN ER: Patient met SIRS criteria temperature 102.8 heart rate up to 100 8:15 and respiratory rate up to 32 white blood cell count 9.7 hemoglobin down to 5.7 lactic acid 0.67 A she was started on vancomycin and Zosyn she was given 1 L normal saline fentanyl 50 g and Toradol 50 mg Chest x-ray showed no active cardiopulmonary disease. 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 Status post cisplatin as well as radiation therapy complicated by radiation dermatitis and severe side effects from cisplatin had to discontinue treatment History of intra-abdominal infection with MRSA associated with G  tube isertion requiring placement drainage tube and IV antibiotics with history of large abdominal abscess in February with repeat CT scan from March showing resolution of an abscess. She has been on IV vanc until 3/24.  Hospitalist was called for admission  for sirs and severe anemia in a setting of stage IV tongue cancer complicated by mucositis   Hospital Course:  #1 systemic inflammatory response syndrome/fevers Patient presented with fevers, odynophagia, noted to have a heart rate up to 100 with a respiratory rate of 32. Etiology of patient's fever is unknown. Chest x-ray was negative for any acute infiltrate. CT abdomen and pelvis with no source of infection noted with resolution of abscess. Urinalysis was nitrite negative leukocytes negative. Patient has been pancultured and results pending at time of discharge. Patient was placed empirically on admission on IV vancomycin and IV Zosyn. Patient's fever curve trended down. IV antibiotics was subsequently narrowed to oral Augmentin and patient completed a course of antibiotic treatment. Patient remained afebrile prior to discharge and fevers had resolved by day of discharge. Patient improved clinically and be discharged in stable and improved condition.  #2 odynophagia secondary to mucositis secondary to radiation Patient with a mucositis with difficulty swallowing and decreased oral intake leading to dehydration. Patient seems congested with secretions on admission. Respiratory therapy has been performing suctioning. Patient was placed on Magic mouthwash with lidocaine 4 times daily as well as sucralfate and doxepin swish and swallow had been ordered by palliative care as well as fentanyl patch. Patient improved clinically during the hospitalization and was followed by oncology and palliative care.   #3 dehydration Secondary to poor oral intake secondary to mucositis. Patient was hydrated with IV fluids and placed on doxepin as well as Magic mouthwash with lidocaine and sucralfate. Patient's tube feeds were also continued during the hospitalization. Patient was hydrated and was euvolemic by day of discharge.   #4 severe anemia Likely secondary to chronic disease secondary to cancer. Patient was  transfused 3 units packed red blood cells. Hemoglobin stabilized at 8.8 from 5.7 on admission. Patient had no overt bleeding. Outpatient follow-up.   #5 hypomagnesemia/hypokalemia Secondary to GI losses. Repleted.  #6 constipation Opoid induced. Patient was initially placed on MiraLAX and Senokot twice daily. Patient was also on Reglan prior to admission which was continued. Patient was given an enema June the hospitalization with good bowel movements. Patient started to have liquid stools and a such laxatives were discontinued including Reglan. Patient be discharged home in stable and improved condition with follow-up in outpatient setting.   #7 Cancer at the base of the tongue Oncology followed the patient during the hospitalization. Patient received radiation treatments while in the hospital. Outpatient follow-up.   #8 hypertension  Remained stable during the hospitalization.  Procedures:  CT abdomen and pelvis 12/31/2015  Chest x-ray 12/30/2015  3 units packed red blood cells 12/30/2015--12/31/2015    Consultations:  Oncology: Dr Dorna Leitz 12/31/2015  Palliative care: Dr.Anwar 12/31/2015  Radiation oncology  Discharge Exam: Filed Vitals:   01/05/16 0439 01/05/16 1337  BP: 165/77 161/75  Pulse: 91 85  Temp: 98.6 F (37 C) 98.7 F (37.1 C)  Resp: 20 18    General: NAD Cardiovascular: RRR Respiratory: CTAB  Discharge Instructions   Discharge Instructions    Diet general    Complete by:  As directed   Clear liquids and advance as tolerated in 1-2days to soft diet.     Discharge instructions    Complete by:  As directed  Follow up with Dr Bertis Ruddy as scheduled.     Increase activity slowly    Complete by:  As directed           Current Discharge Medication List    START taking these medications   Details  Amino Acids-Protein Hydrolys (FEEDING SUPPLEMENT, PRO-STAT SUGAR FREE 64,) LIQD Place 30 mLs into feeding tube daily. Qty: 900 mL, Refills: 0     doxepin (SINEQUAN) 25 MG capsule Take 1 capsule (25 mg total) by mouth every 6 (six) hours. Qty: 30 capsule, Refills: 2    guaiFENesin (ROBITUSSIN) 100 MG/5ML SOLN Place 60 mLs (1,200 mg total) into feeding tube 2 (two) times daily. Take for 3 days, then use as needed. Qty: 473 mL, Refills: 0    Water For Irrigation, Sterile (FREE WATER) SOLN Place 100 mLs into feeding tube 4 (four) times daily.      CONTINUE these medications which have CHANGED   Details  amoxicillin-clavulanate (AUGMENTIN) 875-125 MG tablet Take 1 tablet by mouth 2 (two) times daily. Take for 2 days then stop. Qty: 4 tablet, Refills: 0      CONTINUE these medications which have NOT CHANGED   Details  acetaminophen (TYLENOL) 325 MG tablet Take 650 mg by mouth every 6 (six) hours as needed for moderate pain.    HYDROmorphone (DILAUDID) 4 MG tablet Take 1 tablet (4 mg total) by mouth every 4 (four) hours as needed for severe pain. Qty: 60 tablet, Refills: 0    Nutritional Supplements (FEEDING SUPPLEMENT, OSMOLITE 1.5 CAL,) LIQD Osmolite 1.5 at 60 mL for 12 hours from 7p-7a. 1 can Osmolite 1.5 TID at 9,12,3 with 60 mL free water before and after bolus and continuous feedings. with 240 cc free water once daily. Additional free water flush daily Qty: 1422 mL, Refills: 0   Associated Diagnoses: Cancer of base of tongue (HCC)    sucralfate (CARAFATE) 1 g tablet Dissolve 1 tablet in 10 mL H20 and swallow 30 min prior to meals and bedtime. Qty: 30 tablet, Refills: 5   Associated Diagnoses: Cancer of base of tongue (HCC)      STOP taking these medications     fentaNYL (DURAGESIC - DOSED MCG/HR) 25 MCG/HR patch      metoCLOPramide (REGLAN) 10 MG tablet      fentaNYL (DURAGESIC - DOSED MCG/HR) 12 MCG/HR        Allergies  Allergen Reactions  . Morphine And Related Nausea And Vomiting   Follow-up Information    Follow up with [facility], NI, MD.   Specialty:  Hematology and Oncology   Why:  f/u as  scheduled.   Contact information:   [address] [place] [address] [phone]        The results of significant diagnostics from this hospitalization (including imaging, microbiology, ancillary and laboratory) are listed below for reference.    Significant Diagnostic Studies: Dg Chest 2 View  12/30/2015  CLINICAL DATA:  Lethargic.  Near syncope.  Confusion. EXAM: CHEST  2 VIEW COMPARISON:  11/22/2015 FINDINGS: There is a right jugular Port-A-Cath with tip in the SVC just below the azygos vein junction. The lungs are clear. There is no effusion. Hilar and mediastinal contours are unremarkable and unchanged. Heart size is normal, unchanged. IMPRESSION: No active cardiopulmonary disease. Electronically Signed   By: Ellery Plunk M.D.   On: 12/30/2015 19:59   Ct Abdomen Pelvis W Contrast  12/31/2015  CLINICAL DATA:  New onset syncope.  Worsening weakness.  Sepsis. EXAM: CT ABDOMEN AND PELVIS WITH CONTRAST TECHNIQUE: Multidetector CT imaging of the abdomen and pelvis was performed using the standard protocol following bolus administration of intravenous contrast. CONTRAST:  ISOVUE-300 IOPAMIDOL (ISOVUE-300) INJECTION 61% COMPARISON:  12/08/2015 FINDINGS: Lower chest: Left lower lobe pulmonary nodule is now 13 mm, enlarged from 12/08/2015 when it measured 8 mm. Mild atelectatic appearing linear basilar opacities are present bilaterally. Hepatobiliary: Liver appears normal. Bile ducts are normal. Multiple calculi within the gallbladder. No gallbladder wall thickening or pericholecystic edema. Pancreas: Normal Spleen: Normal, with sub cm focal hypodensity which is unchanged. Adrenals/Urinary Tract: The adrenals and kidneys are normal in appearance with the exception of an unchanged 10 mm hypodensity in the right renal midpole, likely a cyst. There is no urinary calculus evident. There is no hydronephrosis or ureteral dilatation. Collecting systems and ureters appear unremarkable.  Stomach/Bowel: Percutaneous gastrostomy tube. There are normal appearances of the stomach, small bowel and colon. The appendix is normal. Vascular/Lymphatic: The abdominal aorta is normal in caliber. There is mild atherosclerotic calcification. There is no adenopathy in the abdomen or pelvis. Reproductive: Hysterectomy.  No adnexal abnormalities. Other: The epigastric drain has been removed. No recurrent abscess. No focal inflammatory changes in the abdomen or pelvis. No ascites. Musculoskeletal: No significant skeletal lesions. IMPRESSION: 1. Enlarging left lower lobe pulmonary nodule. This could represent a hematogenous metastasis although the rate of enlargement is faster than typical. 2. Complete resolution of the anterior abdominal abscess. The drainage catheter has been removed and there is no evidence of recurrent or residual collection. 3. No inflammatory changes are evident in the abdomen or pelvis. No adenopathy. No ascites. 4. Cholelithiasis. Electronically Signed   By: Ellery Plunk M.D.   On: 12/31/2015 00:41   Ct Abdomen Pelvis W Contrast  12/08/2015  CLINICAL DATA:  70 year old female with a history of upper abdominal abscess. Drainage was performed 11/25/2015. The patient reports today scant fluid output. EXAM: CT ABDOMEN AND PELVIS WITH CONTRAST TECHNIQUE: Multidetector CT imaging of the abdomen and pelvis was performed using the standard protocol following bolus administration of intravenous contrast. CONTRAST:  ISOVUE-300 IOPAMIDOL (ISOVUE-300) INJECTION 61% COMPARISON:  11/25/2015 11/24/2015 FINDINGS: Lower chest: Unremarkable appearance of the superficial soft tissues of the chest. 8 mm nodule at the left costophrenic angle, which does appear on the sagittal reformatted images to have significant volumetric size likely 8 mm in cranial caudal dimension. This was in a region of prior inflammatory changes on the CT 11/24/2015. Heart size within normal limits.  No pericardial fluid/  thickening. Abdomen/ pelvis: Percutaneous surgical drain within the epigastric region overlying the anterior margin of the liver. There is no significant residual fluid at the prior abscess cavity. Unremarkable liver, unremarkable gallbladder. Unremarkable spleen. Unremarkable bilateral adrenal glands. Unremarkable pancreas. Unremarkable left kidney. Low-density lesion within the right kidney measures 11 mm, compatible with a benign cyst. This was present on the comparison CT of 11/24/2015. Unremarkable appearance of small bowel and colon without evidence of obstruction or transition point. Percutaneous gastrostomy tube within the upper abdomen of well positioned. Surgical clips within the anatomic pelvis. Unremarkable appearance of the urinary bladder. Surgical changes of hysterectomy. Scattered vascular calcifications of the abdominal aorta. No aneurysm or dissection flap. Musculoskeletal: No displaced fracture. Multilevel degenerative disc disease with associated facet disease. No significant bony canal narrowing. Degenerative changes of the bilateral hips. IMPRESSION: Complete resolution of upper abdominal abscess with percutaneous drain in position. Percutaneous gastrostomy tube remains well positioned. There is a new 8  mm nodule at the left lung base. This has developed since the comparison PET-CT of 10/14/2015, and was in a region of inflammatory changes on the prior abdominal CT. While this is most likely postinflammatory, a short-term follow-up chest CT is recommended in 3 months given the patient's history of malignancy, to assure resolution and rule out metastatic disease. Signed, Yvone Neu. Loreta Ave, DO [facility] Radiology Electronically Signed   By: Gilmer Mor D.O.   On: 12/08/2015 13:03   Ir Patient Eval Tech 0-60 Mins  12/30/2015  Kathee Polite     12/30/2015  9:07 AM Patient came to IR with port flipped upside down.  Port was successfully  manipulated into proper position without difficulty.  Patient stated that she was finished with all appointments at the [facility] today, but would be back tomorrow for fluids.  Dr. Deanne Coffer was informed.  Patient was instructed to call the [facility] IR department in the morning, if the portacath felt like it had flipped again so we could fix it and access it for the Cancer Center appointment.  Patient given an appointment card with the [facility] IR contact information and voiced understanding of the instructions.  Patient's PIV was removed and patient sent home.   Microbiology: Recent Results (from the past 240 hour(s))  Culture, blood (routine x 2)     Status: None   Collection Time: 12/30/15  8:04 PM  Result Value Ref Range Status   Specimen Description BLOOD RIGHT CHEST PORT  Final   Special Requests BOTTLES DRAWN AEROBIC AND ANAEROBIC 5CC EACH  Final   Culture   Final    NO GROWTH 5 DAYS Performed at [facility]    Report Status 01/05/2016 FINAL  Final  Culture, blood (routine x 2)     Status: None   Collection Time: 12/30/15  8:43 PM  Result Value Ref Range Status   Specimen Description BLOOD LEFT WRIST  Final   Special Requests BOTTLES DRAWN AEROBIC AND ANAEROBIC 5CC EACH  Final   Culture   Final    NO GROWTH 5 DAYS Performed at [facility]    Report Status 01/05/2016 FINAL  Final  Urine culture     Status: Abnormal   Collection Time: 12/30/15  9:50 PM  Result Value Ref Range Status   Specimen Description URINE, CLEAN CATCH  Final   Special Requests NONE  Final   Culture (A)  Final    1,000 COLONIES/mL INSIGNIFICANT GROWTH Performed at [facility]    Report Status 01/01/2016 FINAL  Final  MRSA PCR Screening     Status: Abnormal   Collection Time: 12/31/15  1:26 AM  Result Value Ref Range Status   MRSA by PCR POSITIVE (A) NEGATIVE Final    Comment:        The GeneXpert MRSA Assay (FDA approved for NASAL specimens only), is one component of a comprehensive  MRSA colonization surveillance program. It is not intended to diagnose MRSA infection nor to guide or monitor treatment for MRSA infections. RESULT CALLED TO, READ BACK BY AND VERIFIED WITH: K.DION,RN AT 0413 ON 12/31/15 BY W.SHEA      Labs: Basic Metabolic Panel:  Recent Labs Lab 12/30/15 0947  12/31/15 0130 01/01/16 0441 01/02/16 0450 01/03/16 0350 01/04/16 0345 01/05/16 0445  NA 132*  < > 135 134* 133* 134* 133* 132*  K 4.3  < > 3.6 2.8* 3.3* 3.5 3.3* 3.4*  CL  --   < >  102 99* 96* 96* 96* 94*  CO2 29  < > 25 26 27 29 29 30   GLUCOSE 160*  < > 114* 122* 146* 158* 142* 172*  BUN 26.0  < > 22* 18 11 12 13 13   CREATININE 1.0  < > 0.90 0.83 0.82 0.85 0.83 0.76  CALCIUM 9.4  < > 8.5* 8.3* 8.2* 8.8* 8.4* 8.8*  MG 1.6  --  1.5* 1.9  --   --   --   --   PHOS  --   --  3.0  --   --   --   --   --   < > = values in this interval not displayed. Liver Function Tests:  Recent Labs Lab 12/30/15 0947 12/30/15 2007 12/31/15 0130  AST 35* 31 27  ALT 38 36 31  ALKPHOS 98 87 73  BILITOT 0.80 1.2 1.2  PROT 6.7 6.3* 5.7*  ALBUMIN 3.1* 3.1* 2.7*   No results for input(s): LIPASE, AMYLASE in the last 168 hours. No results for input(s): AMMONIA in the last 168 hours. CBC:  Recent Labs Lab 12/30/15 0947 12/30/15 2007  01/01/16 0441 01/02/16 0450 01/03/16 0350 01/04/16 0345 01/05/16 0445  WBC 7.0 9.7  < > 3.8* 3.2* 3.7* 3.6* 3.4*  NEUTROABS 6.3 8.2*  --  2.9  --   --  2.6  --   HGB 7.3* 5.7*  < > 9.0* 8.8* 9.6* 8.7* 8.8*  HCT 23.0* 17.1*  < > 26.7* 26.1* 28.2* 25.6* 26.2*  MCV 65.1* 68.4*  < > 70.1* 69.6* 71.2* 70.1* 69.5*  PLT 274 289  < > 222 215 248 237 278  < > = values in this interval not displayed. Cardiac Enzymes: No results for input(s): CKTOTAL, CKMB, CKMBINDEX, TROPONINI in the last 168 hours. BNP: BNP (last 3 results) No results for input(s): BNP in the last 8760 hours.  ProBNP (last 3 results) No results for input(s): PROBNP in the last 8760  hours.  CBG:  Recent Labs Lab 01/04/16 2002 01/04/16 2354 01/05/16 0433 01/05/16 0748 01/05/16 1149  GLUCAP 138* 154* 171* 154* 139*       Signed:  THOMPSON,DANIEL MD.  [facility] 01/05/2016, 3:32 PM

## 2016-01-05 NOTE — Progress Notes (Signed)
Daily Progress Note   Patient Name: Debra Farrell       Date: 01/05/2016 DOB: 01-Jul-1946  Age: 70 y.o. MRN#: [id number] Attending Physician: No att. 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   Subjective/interval events: Awake Farrell, is able to talk more, less symptom burden from secretions, trying the Doxepin rinse here and there.  Pain well controlled, continue fentanyl patch as well as PO Dilaudid PRN.  Completed radiation treatment today, appreciate Debra Farrell input and recommendations Has lung nodule and is concerned regarding possibility of metastatic disease, ?candidate for SBRT lung  I discussed with Debra Farrell and her daughter regarding goals of care moving forward. She and her daughter, Debra Farrell, both report understanding the severity of her illness.  They have been hopeful that this is something that can be treated with radiation therapy but understand that this would all change if it turns out that noted lung lesion is in fact metastatic disease. Debra Farrell reports that her mother would not want prolonged medical interventions in the case that she were to continue to decline, however, this is something that they need to discuss further with the rest of the family.  She reports that her mother was going to make herself DO NOT RESUSCITATE and one point in the past and this was very upsetting to the rest of her family and she changed her mind. She reports knowing that this is something they will need to discuss further to prepare family and have a copy of a most form that was given to them to review with family to facilitate conversation.  They do  not want to complete MOST form during this admission.    Additionally, I provided my contact information as well as a copy of [employer].  Debra Farrell reports this is a conversation she would like to continue with Debra Farrell during follow-up.  Length of Stay: 6 days Physical Exam: Physical Exam             Age appropriate, No significant distress S1 S2 Clear anteriorly Abdomen soft, has PEG+ No edema Radiation related changes to chest area Awake Farrell non focal  Vital Signs: BP 161/75 mmHg  Pulse  85  Temp(Src) 98.7 F (37.1 C) (Oral)  Resp 18  Ht 5\' 6"  (1.676 m)  Wt 67.858 kg (149 lb 9.6 oz)  BMI 24.16 kg/m2  SpO2 98% SpO2: SpO2: 98 % O2 Device: O2 Device: Not Delivered O2 Flow Rate: O2 Flow Rate (L/min): 2 L/min  Intake/output summary:   Intake/Output Summary (Last 24 hours) at 01/05/16 1924 Last data filed at 01/05/16 1253  Gross per 24 hour  Intake   1767 ml  Output      2 ml  Net   1765 ml   LBM: Last BM Date: 01/05/16 Baseline Weight: Weight: 68.947 kg (152 lb) Most recent weight: Weight: 67.858 kg (149 lb 9.6 oz)       Palliative Assessment/Data: Flowsheet Rows        Most Recent Value   Intake Tab    Referral Department  Hospitalist   Unit at Time of Referral  Med/Surg Unit   Palliative Care Primary Diagnosis  Cancer   Date Notified  12/31/15   Palliative Care Type  Return patient Palliative Care   Reason for referral  Non-pain Symptom   Date of Admission  12/30/15   Date first seen by Palliative Care  12/31/15   # of days Palliative referral response time  0 Day(s)   # of days IP prior to Palliative referral  1   Clinical Assessment    Palliative Performance Scale Score  40%   Pain Max last 24 hours  4   Pain Min Last 24 hours  3   Dyspnea Max Last 24 Hours  4   Dyspnea Min Last 24 hours  3   Psychosocial & Spiritual Assessment    Palliative Care Outcomes    Patient/Family meeting held?  Yes   Who was at the meeting?   patient daughter    Palliative Care Outcomes  Clarified goals of care   Palliative Care follow-up planned  No   Palliative Care Follow-up Reason  Non-pain symptom, Clarify goals of care      Additional Data Reviewed: CBC    Component Value Date/Time   WBC 3.4* 01/05/2016 0445   WBC 7.0 12/30/2015 0947   RBC 3.77* 01/05/2016 0445   RBC 3.08* 12/30/2015 2220   RBC 3.54* 12/30/2015 0947   HGB 8.8* 01/05/2016 0445   HGB 7.3* 12/30/2015 0947   HCT 26.2* 01/05/2016 0445   HCT 23.0* 12/30/2015 0947   PLT 278 01/05/2016 0445   PLT 274 12/30/2015 0947   MCV 69.5* 01/05/2016 0445   MCV 65.1* 12/30/2015 0947   MCH 23.3* 01/05/2016 0445   MCH 20.6* 12/30/2015 0947   MCHC 33.6 01/05/2016 0445   MCHC 31.7 12/30/2015 0947   RDW NOT CALCULATED 01/05/2016 0445   RDW 23.7* 12/30/2015 0947   LYMPHSABS 0.2* 01/04/2016 0345   LYMPHSABS 0.1* 12/30/2015 0947   MONOABS 0.5 01/04/2016 0345   MONOABS 0.5 12/30/2015 0947   EOSABS 0.3 01/04/2016 0345   EOSABS 0.0 12/30/2015 0947   BASOSABS 0.0 01/04/2016 0345   BASOSABS 0.0 12/30/2015 0947    CMP     Component Value Date/Time   NA 132* 01/05/2016 0445   NA 132* 12/30/2015 0947   K 3.4* 01/05/2016 0445   K 4.3 12/30/2015 0947   CL 94* 01/05/2016 0445   CO2 30 01/05/2016 0445   CO2 29 12/30/2015 0947   GLUCOSE 172* 01/05/2016 0445   GLUCOSE 160* 12/30/2015 0947   BUN 13 01/05/2016 0445   BUN 26.0 12/30/2015  0947   CREATININE 0.76 01/05/2016 0445   CREATININE 1.0 12/30/2015 0947   CREATININE 1.04 04/07/2015 1536   CALCIUM 8.8* 01/05/2016 0445   CALCIUM 9.4 12/30/2015 0947   PROT 5.7* 12/31/2015 0130   PROT 6.7 12/30/2015 0947   ALBUMIN 2.7* 12/31/2015 0130   ALBUMIN 3.1* 12/30/2015 0947   AST 27 12/31/2015 0130   AST 35* 12/30/2015 0947   ALT 31 12/31/2015 0130   ALT 38 12/30/2015 0947   ALKPHOS 73 12/31/2015 0130   ALKPHOS 98 12/30/2015 0947   BILITOT 1.2 12/31/2015 0130   BILITOT 0.80 12/30/2015 0947   GFRNONAA >60  01/05/2016 0445   GFRAA >60 01/05/2016 0445       Problem List:  Patient Active Problem List   Diagnosis Date Noted  . Malnutrition of moderate degree 01/04/2016  . Goals of care, counseling/discussion   . Dysphagia 12/31/2015  . 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 Encounter for palliative care   . Odynophagia   . SIRS (systemic inflammatory response syndrome) (HCC) 12/30/2015  . Sepsis (HCC) 12/30/2015  . Throat pain in adult 12/29/2015  . 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 Nausea without vomiting 12/16/2015  . Anemia in chronic illness 12/16/2015  . Neutropenic fever (HCC)   . Abdominal pain   . Abscess   . Anemia due to other cause   . Renal failure   . Fever   . Hypophosphatemia 11/21/2015  . Acute renal failure (ARF) (HCC)   . Dehydration   . Hypokalemia   . Hypomagnesemia   . Severe protein-calorie malnutrition (HCC) 11/14/2015  . Intractable nausea and vomiting 11/13/2015  . Gastritis, acute 11/13/2015  . Pain at surgical incision 11/10/2015  . Constipation due to opioid therapy 11/10/2015  . 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  . Skin lesion of back 03/21/2013  . Lipoma of axilla 03/21/2013  . Sleep disorder 03/17/2013  . Skin lesion 03/17/2013  . Health care maintenance 03/17/2013  . Right knee pain 04/05/2012  . Health maintenance examination 10/17/2011  . Thalassemia 10/17/2011  . Essential hypertension 10/17/2011  . Hyperlipidemia 10/17/2011  . Overweight(278.02) 10/17/2011  . Headache(784.0) 10/17/2011     Palliative Care Assessment & Plan    1.Code Status:  Full code    Code Status Orders        Start     Ordered   12/31/15 0057  Full code   Continuous     12/31/15 0057    Code Status History    Date Active Date Inactive Code Status Order ID Comments User Context   11/13/2015  1:10 PM 11/30/2015  2:21 PM Full Code [id number]  Artis Delay, MD Inpatient   11/05/2015  2:49 PM 11/06/2015  3:24 AM Full Code [id number]  Oley Balm, MD HOV   11/05/2015  2:49 PM 11/05/2015  2:49 PM Full Code [id number]  Oley Balm, MD [facility]    Advance Directive Documentation        Most Recent Value   Type of Advance Directive  Healthcare Power of Attorney, Living will   Pre-existing out of facility DNR order (yellow form or pink MOST form)     "MOST" Form in Place?         2. Goals of Care/Additional Recommendations:  Limitations on Scope of Treatment: Full Scope Treatment for now.   Desire for further Chaplaincy support:no  Psycho-social Needs: Caregiving  Support/Resources  3. Symptom Management:      1. As above  4. Palliative Prophylaxis:   Bowel Regimen  5. Prognosis: Unable to determine but appears guarded due to advanced disease and symptom burden.   6. Discharge Planning:  Home with follow-up with Debra Farrell next week   Care plan was discussed with  Patient, daughter.   Thank you for allowing the Palliative Medicine Team to assist in the care of this patient.   Time In: 1520 Time Out: 1545 Total Time 20 Prolonged Time Billed  no        [id number] Romie Minus, MD  01/05/2016, 7:24 PM  Please contact Palliative Medicine Team phone at [phone] for questions and concerns.

## 2016-01-05 NOTE — Telephone Encounter (Signed)
Gave and printed appt sched and avs for pt for April °

## 2016-01-05 NOTE — Progress Notes (Signed)
As pt plans to dc today, recommend follow up SLP at [facility] or [place] to help with her dysphagia.  Please order if you agree.  Thanks.  Luanna Salk, [facility] SLP [phone]

## 2016-01-05 NOTE — Progress Notes (Signed)
  Oncology Nurse Navigator Documentation  Navigator Location: [facility] Onc (01/05/16 1445) Navigator Encounter Type: Treatment (01/05/16 1445)             Treatment Phase: Final Radiation Tx (01/05/16 1445)     Met with Ms. Picazo during final RT to offer support and to celebrate end of radiation treatment.  She was accompanied by her dtr Abigail Butts.  Dtr. Indicated she is being [place] home after today's tmt.  I provided dtr with a Certificate of Recognition for her supportive care during her mother's tmt. I provided post-RT guidance:  Importance of keeping follow-up appts with Nutrition and SLP.  Importance of protecting treatment area from sun.  Continuation of Sonafine application 2-3 times daily. We discussed she can expect the gradual resolution of throat pain and neck sensitivity after the next two weeks. We discussed that as her throat pain resolves and she begins oral intake, she should try different soft foods and liquids for palatability and ease of swallowing. I explained that my role as navigator will continue for several more months and that I will be calling and/or joining her during follow-up visits.   I encouraged them to call me with needs/concerns.    Gayleen Orem, RN, BSN, [place] at [place] [phone]                            Time Spent with Patient: 45 (01/05/16 1445)

## 2016-01-05 NOTE — Progress Notes (Signed)
Debra Farrell   DOB:07-16-1946   NX#:[id number]    Subjective: She felt better. She continues to have productive cough. She had diarrhea alternates with constipation. The pain appears to be under control. Fever had resolved. Cultures were negative.   Objective:  Filed Vitals:   01/04/16 2151 01/05/16 0439  BP: 168/73 165/77  Pulse: 88 91  Temp: 99 F (37.2 C) 98.6 F (37 C)  Resp: 19 20     Intake/Output Summary (Last 24 hours) at 01/05/16 1326 Last data filed at 01/05/16 0850  Gross per 24 hour  Intake 1925.33 ml  Output      2 ml  Net 1923.33 ml    GENERAL:alert, no distress and comfortable SKIN: skin color, texture, turgor are normal, no rashes or significant lesions EYES: normal, Conjunctiva are pink and non-injected, sclera clear Musculoskeletal:no cyanosis of digits and no clubbing  NEURO: alert & oriented x 3 with fluent speech, no focal motor/sensory deficits   Labs:  Lab Results  Component Value Date   WBC 3.4* 01/05/2016   HGB 8.8* 01/05/2016   HCT 26.2* 01/05/2016   MCV 69.5* 01/05/2016   PLT 278 01/05/2016   NEUTROABS 2.6 01/04/2016    Lab Results  Component Value Date   NA 132* 01/05/2016   K 3.4* 01/05/2016   CL 94* 01/05/2016   CO2 30 01/05/2016    Assessment & Plan:   Cancer of base of tongue ([place]) The patient has experienced significant decline since the last time I saw her. Continue aggressive supportive care. I would defer to the radiation oncologist to determine further radiation therapy  Throat pain in adult She felt better on increased dose fentanyl patch 37.5 g. Appreciate assistance from palliative care team for pain management  Constipation due to opioid therapy, resolved Continue laxative management.  Intractable nausea and vomiting, resolved Mucus gagging She has severe intractable nausea and vomiting likely multifactorial from mucous secretion, constipation and delayed gastric clearance from nutritional supplements. I  recommend IV fluid hydration therapy and IV antiemetics on a regular basis. This is improved. Continue scheduled zofran  Severe protein-calorie malnutrition ([place]) She is not able to tolerate her nutritional supplement due to uncontrolled nausea and vomiting. 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 She is using topical emollient cream.  Intermittent confusion, resolved  Recent MRSA infection, recurrent fever I agree with broad-spectrum IV antibiotics. Cultures are negative.  Enlarging lung nodule Worrisome for metastatic cancer  Severe anemia, anemia of chronic disease She had received blood transfusions Currently asymptomatic Observe closely  Severe hypokalemia Due to potassium wasting from Zosyn and poor oral intake Continue replacement therapy  CODE STATUS Goals of care Discharge planning: to home today Follow up next week Will sign off  [facility] Drelyn Pistilli, MD 01/05/2016  1:26 PM

## 2016-01-05 NOTE — Progress Notes (Signed)
Completed D/C teaching with patient. Gave prescriptions. Answered all questions. Patient will be D/C home in stable condition with family.

## 2016-01-06 ENCOUNTER — Ambulatory Visit: Payer: 59

## 2016-01-06 ENCOUNTER — Encounter: Payer: Self-pay | Admitting: Nutrition

## 2016-01-06 ENCOUNTER — Ambulatory Visit: Payer: Self-pay

## 2016-01-07 ENCOUNTER — Ambulatory Visit: Payer: 59

## 2016-01-07 ENCOUNTER — Ambulatory Visit: Payer: Self-pay

## 2016-01-07 DIAGNOSIS — C01 Malignant neoplasm of base of tongue: Secondary | ICD-10-CM | POA: Diagnosis not present

## 2016-01-07 DIAGNOSIS — E43 Unspecified severe protein-calorie malnutrition: Secondary | ICD-10-CM | POA: Diagnosis not present

## 2016-01-07 DIAGNOSIS — L0291 Cutaneous abscess, unspecified: Secondary | ICD-10-CM | POA: Diagnosis not present

## 2016-01-07 NOTE — Progress Notes (Signed)
  Oncology Nurse Navigator Documentation  Navigator Location: [facility] (01/04/16 1015) Navigator Encounter Type: Clinic/MDC (01/04/16 1015)           Patient Visit Type: FXOVAN (01/04/16 1015) Treatment Phase: Active Tx (01/04/16 1015)     Met with patient during Weekly Under Treat appointment with Dr. Isidore Moos.  She was accompanied by her dtr Abigail Butts.  Ms. Monroy completes radiotherapy tomorrow.  She is motivated to complete XRT for her base-of-tongue CA, understands the value of completing this therapy and then moving forward to address LLL pulmonary nodule.  She reports feeling better as a result of this admission.  She presents this morning with improved secretion management, is confident she can sustain prone position for tmt.  They understand I can be contacted with needs/concerns.  Gayleen Orem, RN, BSN, [place] at [place] [phone]                            Time Spent with Patient: 30 (01/04/16 1015)

## 2016-01-08 ENCOUNTER — Ambulatory Visit (HOSPITAL_BASED_OUTPATIENT_CLINIC_OR_DEPARTMENT_OTHER): Payer: 59

## 2016-01-08 ENCOUNTER — Ambulatory Visit: Payer: Self-pay | Admitting: Hematology and Oncology

## 2016-01-08 VITALS — BP 143/65 | HR 92 | Temp 99.0°F | Resp 18

## 2016-01-08 DIAGNOSIS — C01 Malignant neoplasm of base of tongue: Secondary | ICD-10-CM | POA: Diagnosis not present

## 2016-01-08 MED ORDER — SODIUM CHLORIDE 0.9 % IV SOLN
Freq: Once | INTRAVENOUS | Status: AC
  Administered 2016-01-08: 13:00:00 via INTRAVENOUS

## 2016-01-08 MED ORDER — SODIUM CHLORIDE 0.9 % IV SOLN
Freq: Once | INTRAVENOUS | Status: AC
  Administered 2016-01-08: 13:00:00 via INTRAVENOUS
  Filled 2016-01-08: qty 4

## 2016-01-08 MED ORDER — HEPARIN SOD (PORK) LOCK FLUSH 100 UNIT/ML IV SOLN
500.0000 [IU] | Freq: Once | INTRAVENOUS | Status: AC | PRN
  Administered 2016-01-08: 500 [IU]
  Filled 2016-01-08: qty 5

## 2016-01-08 MED ORDER — SODIUM CHLORIDE 0.9 % IJ SOLN
10.0000 mL | INTRAMUSCULAR | Status: DC | PRN
  Administered 2016-01-08: 10 mL
  Filled 2016-01-08: qty 10

## 2016-01-08 NOTE — Patient Instructions (Signed)

## 2016-01-09 ENCOUNTER — Ambulatory Visit (HOSPITAL_BASED_OUTPATIENT_CLINIC_OR_DEPARTMENT_OTHER): Payer: 59

## 2016-01-09 VITALS — BP 150/89 | HR 94 | Temp 98.3°F

## 2016-01-09 DIAGNOSIS — C01 Malignant neoplasm of base of tongue: Secondary | ICD-10-CM | POA: Diagnosis not present

## 2016-01-09 DIAGNOSIS — R070 Pain in throat: Secondary | ICD-10-CM | POA: Diagnosis not present

## 2016-01-09 MED ORDER — HEPARIN SOD (PORK) LOCK FLUSH 100 UNIT/ML IV SOLN
250.0000 [IU] | Freq: Once | INTRAVENOUS | Status: DC | PRN
  Filled 2016-01-09: qty 5

## 2016-01-09 MED ORDER — SODIUM CHLORIDE 0.9 % IJ SOLN
10.0000 mL | INTRAMUSCULAR | Status: DC | PRN
  Administered 2016-01-09: 10 mL
  Filled 2016-01-09: qty 10

## 2016-01-09 MED ORDER — HYDROMORPHONE HCL 4 MG/ML IJ SOLN
INTRAMUSCULAR | Status: AC
  Filled 2016-01-09: qty 1

## 2016-01-09 MED ORDER — SODIUM CHLORIDE 0.9 % IV SOLN
Freq: Once | INTRAVENOUS | Status: AC
  Administered 2016-01-09: 08:00:00 via INTRAVENOUS

## 2016-01-09 MED ORDER — HYDROMORPHONE HCL 4 MG/ML IJ SOLN
2.0000 mg | INTRAMUSCULAR | Status: DC | PRN
  Administered 2016-01-09: 2 mg via INTRAVENOUS

## 2016-01-09 MED ORDER — ALTEPLASE 2 MG IJ SOLR
2.0000 mg | Freq: Once | INTRAMUSCULAR | Status: DC | PRN
  Filled 2016-01-09: qty 2

## 2016-01-09 MED ORDER — HEPARIN SOD (PORK) LOCK FLUSH 100 UNIT/ML IV SOLN
500.0000 [IU] | Freq: Once | INTRAVENOUS | Status: AC | PRN
  Administered 2016-01-09: 500 [IU]
  Filled 2016-01-09: qty 5

## 2016-01-09 NOTE — Patient Instructions (Signed)

## 2016-01-09 NOTE — Progress Notes (Signed)
09:45 am Pt requested pain med, stating that throat hurts & rates pain # 8.  Dilaudid 2 mg given. Pain rated # 7 at d/c.

## 2016-01-11 ENCOUNTER — Ambulatory Visit (HOSPITAL_BASED_OUTPATIENT_CLINIC_OR_DEPARTMENT_OTHER): Payer: 59

## 2016-01-11 VITALS — BP 154/75 | HR 101 | Temp 99.4°F | Resp 18

## 2016-01-11 DIAGNOSIS — C01 Malignant neoplasm of base of tongue: Secondary | ICD-10-CM

## 2016-01-11 MED ORDER — SODIUM CHLORIDE 0.9 % IV SOLN
Freq: Once | INTRAVENOUS | Status: AC
  Administered 2016-01-11: 15:00:00 via INTRAVENOUS
  Filled 2016-01-11: qty 4

## 2016-01-11 MED ORDER — SODIUM CHLORIDE 0.9 % IJ SOLN
10.0000 mL | INTRAMUSCULAR | Status: DC | PRN
  Administered 2016-01-11: 10 mL
  Filled 2016-01-11: qty 10

## 2016-01-11 MED ORDER — SODIUM CHLORIDE 0.9 % IV SOLN
Freq: Once | INTRAVENOUS | Status: AC
  Administered 2016-01-11: 15:00:00 via INTRAVENOUS

## 2016-01-11 MED ORDER — HEPARIN SOD (PORK) LOCK FLUSH 100 UNIT/ML IV SOLN
500.0000 [IU] | Freq: Once | INTRAVENOUS | Status: AC | PRN
Start: 2016-01-11 — End: 2016-01-11
  Administered 2016-01-11: 500 [IU]
  Filled 2016-01-11: qty 5

## 2016-01-11 NOTE — Patient Instructions (Signed)

## 2016-01-12 ENCOUNTER — Ambulatory Visit (HOSPITAL_BASED_OUTPATIENT_CLINIC_OR_DEPARTMENT_OTHER): Payer: 59

## 2016-01-12 VITALS — BP 153/76 | HR 92 | Temp 98.3°F | Resp 18

## 2016-01-12 DIAGNOSIS — C01 Malignant neoplasm of base of tongue: Secondary | ICD-10-CM | POA: Diagnosis not present

## 2016-01-12 MED ORDER — HYDROMORPHONE HCL 4 MG/ML IJ SOLN
2.0000 mg | INTRAMUSCULAR | Status: DC | PRN
  Administered 2016-01-12: 2 mg via INTRAVENOUS

## 2016-01-12 MED ORDER — SODIUM CHLORIDE 0.9 % IJ SOLN
10.0000 mL | INTRAMUSCULAR | Status: DC | PRN
  Administered 2016-01-12: 10 mL
  Filled 2016-01-12: qty 10

## 2016-01-12 MED ORDER — SODIUM CHLORIDE 0.9 % IV SOLN
Freq: Once | INTRAVENOUS | Status: AC
  Administered 2016-01-12: 14:00:00 via INTRAVENOUS

## 2016-01-12 MED ORDER — HYDROMORPHONE HCL 4 MG PO TABS: 4.0000 mg | ORAL_TABLET | ORAL | Status: DC | PRN

## 2016-01-12 MED ORDER — HYDROMORPHONE HCL 4 MG/ML IJ SOLN
INTRAMUSCULAR | Status: AC
  Filled 2016-01-12: qty 1

## 2016-01-12 MED ORDER — HEPARIN SOD (PORK) LOCK FLUSH 100 UNIT/ML IV SOLN
500.0000 [IU] | Freq: Once | INTRAVENOUS | Status: AC | PRN
  Administered 2016-01-12: 500 [IU]
  Filled 2016-01-12: qty 5

## 2016-01-12 MED ORDER — ONDANSETRON HCL 40 MG/20ML IJ SOLN
Freq: Once | INTRAMUSCULAR | Status: AC
  Administered 2016-01-12: 14:00:00 via INTRAVENOUS
  Filled 2016-01-12: qty 4

## 2016-01-12 MED FILL — HYDROmorphone HCL 4 MG TABS: 4 | 15 days supply | Qty: 90 | Fill #0

## 2016-01-12 MED FILL — DOXEPIN 25 MG CAPSULE: 25 | 7 days supply | Qty: 30 | Fill #1

## 2016-01-12 NOTE — Patient Instructions (Signed)

## 2016-01-13 ENCOUNTER — Encounter: Payer: Self-pay | Admitting: Hematology and Oncology

## 2016-01-13 ENCOUNTER — Ambulatory Visit (HOSPITAL_BASED_OUTPATIENT_CLINIC_OR_DEPARTMENT_OTHER): Payer: 59

## 2016-01-13 ENCOUNTER — Other Ambulatory Visit: Payer: Self-pay | Admitting: *Deleted

## 2016-01-13 ENCOUNTER — Other Ambulatory Visit (HOSPITAL_BASED_OUTPATIENT_CLINIC_OR_DEPARTMENT_OTHER): Payer: 59

## 2016-01-13 ENCOUNTER — Ambulatory Visit: Payer: 59 | Admitting: Nutrition

## 2016-01-13 ENCOUNTER — Ambulatory Visit: Payer: 59 | Admitting: Radiation Oncology

## 2016-01-13 ENCOUNTER — Telehealth: Payer: Self-pay | Admitting: Hematology and Oncology

## 2016-01-13 ENCOUNTER — Ambulatory Visit (HOSPITAL_BASED_OUTPATIENT_CLINIC_OR_DEPARTMENT_OTHER): Payer: 59 | Admitting: Hematology and Oncology

## 2016-01-13 VITALS — BP 146/66 | HR 96 | Temp 98.4°F | Resp 18 | Ht 66.0 in | Wt 148.7 lb

## 2016-01-13 VITALS — BP 144/78 | HR 97 | Temp 98.9°F | Resp 18

## 2016-01-13 DIAGNOSIS — D6489 Other specified anemias: Secondary | ICD-10-CM | POA: Diagnosis not present

## 2016-01-13 DIAGNOSIS — IMO0002 Reserved for concepts with insufficient information to code with codable children: Secondary | ICD-10-CM

## 2016-01-13 DIAGNOSIS — E43 Unspecified severe protein-calorie malnutrition: Secondary | ICD-10-CM

## 2016-01-13 DIAGNOSIS — C01 Malignant neoplasm of base of tongue: Secondary | ICD-10-CM

## 2016-01-13 DIAGNOSIS — A4902 Methicillin resistant Staphylococcus aureus infection, unspecified site: Secondary | ICD-10-CM

## 2016-01-13 DIAGNOSIS — R070 Pain in throat: Secondary | ICD-10-CM | POA: Diagnosis not present

## 2016-01-13 DIAGNOSIS — D568 Other thalassemias: Secondary | ICD-10-CM

## 2016-01-13 LAB — CBC & DIFF AND RETIC
BASO%: 0.4 % (ref 0.0–2.0)
BASOS ABS: 0 10*3/uL (ref 0.0–0.1)
EOS ABS: 0.1 10*3/uL (ref 0.0–0.5)
EOS%: 0.7 % (ref 0.0–7.0)
HEMATOCRIT: 26.1 % — AB (ref 34.8–46.6)
HEMOGLOBIN: 8.5 g/dL — AB (ref 11.6–15.9)
IMMATURE RETIC FRACT: 3.9 % (ref 1.60–10.00)
LYMPH%: 6 % — AB (ref 14.0–49.7)
MCH: 23.4 pg — AB (ref 25.1–34.0)
MCHC: 32.6 g/dL (ref 31.5–36.0)
MCV: 71.9 fL — AB (ref 79.5–101.0)
MONO#: 0.8 10*3/uL (ref 0.1–0.9)
MONO%: 11.5 % (ref 0.0–14.0)
NEUT#: 5.4 10*3/uL (ref 1.5–6.5)
NEUT%: 81.4 % — ABNORMAL HIGH (ref 38.4–76.8)
Platelets: 337 10*3/uL (ref 145–400)
RBC: 3.63 10*6/uL — ABNORMAL LOW (ref 3.70–5.45)
RDW: 23.6 % — ABNORMAL HIGH (ref 11.2–14.5)
RETIC %: 1.42 % (ref 0.70–2.10)
Retic Ct Abs: 51.55 10*3/uL (ref 33.70–90.70)
WBC: 6.7 10*3/uL (ref 3.9–10.3)
lymph#: 0.4 10*3/uL — ABNORMAL LOW (ref 0.9–3.3)

## 2016-01-13 LAB — COMPREHENSIVE METABOLIC PANEL
ALBUMIN: 2.8 g/dL — AB (ref 3.5–5.0)
ALK PHOS: 74 U/L (ref 40–150)
ALT: 16 U/L (ref 0–55)
AST: 24 U/L (ref 5–34)
Anion Gap: 7 mEq/L (ref 3–11)
BILIRUBIN TOTAL: 0.52 mg/dL (ref 0.20–1.20)
BUN: 18.6 mg/dL (ref 7.0–26.0)
CALCIUM: 9.5 mg/dL (ref 8.4–10.4)
CO2: 30 mEq/L — ABNORMAL HIGH (ref 22–29)
Chloride: 99 mEq/L (ref 98–109)
Creatinine: 1 mg/dL (ref 0.6–1.1)
EGFR: 59 mL/min/{1.73_m2} — ABNORMAL LOW (ref >90)
Glucose: 93 mg/dl (ref 70–140)
POTASSIUM: 4 meq/L (ref 3.5–5.1)
Sodium: 135 mEq/L — ABNORMAL LOW (ref 136–145)
TOTAL PROTEIN: 6.3 g/dL — AB (ref 6.4–8.3)

## 2016-01-13 LAB — MAGNESIUM: MAGNESIUM: 1.8 mg/dL (ref 1.5–2.5)

## 2016-01-13 MED ORDER — FENTANYL 25 MCG/HR TD PT72: 25.0000 ug | MEDICATED_PATCH | TRANSDERMAL | Status: DC

## 2016-01-13 MED ORDER — HEPARIN SOD (PORK) LOCK FLUSH 100 UNIT/ML IV SOLN
500.0000 [IU] | Freq: Once | INTRAVENOUS | Status: DC | PRN
  Filled 2016-01-13: qty 5

## 2016-01-13 MED ORDER — SODIUM CHLORIDE 0.9 % IJ SOLN
10.0000 mL | INTRAMUSCULAR | Status: DC | PRN
  Filled 2016-01-13: qty 10

## 2016-01-13 MED ORDER — SODIUM CHLORIDE 0.9 % IV SOLN
Freq: Once | INTRAVENOUS | Status: AC
  Administered 2016-01-13: 13:00:00 via INTRAVENOUS

## 2016-01-13 MED ORDER — SODIUM CHLORIDE 0.9 % IJ SOLN
10.0000 mL | INTRAMUSCULAR | Status: DC | PRN
  Administered 2016-01-13: 10 mL
  Filled 2016-01-13: qty 10

## 2016-01-13 MED ORDER — HYDROMORPHONE HCL 1 MG/ML IJ SOLN
2.0000 mg | Freq: Once | INTRAMUSCULAR | Status: AC
  Administered 2016-01-13: 2 mg via INTRAVENOUS
  Filled 2016-01-13: qty 2

## 2016-01-13 MED ORDER — FENTANYL 12 MCG/HR TD PT72: 12.0000 ug | MEDICATED_PATCH | TRANSDERMAL | Status: DC

## 2016-01-13 MED ORDER — HYDROMORPHONE HCL 4 MG/ML IJ SOLN
INTRAMUSCULAR | Status: AC
  Filled 2016-01-13: qty 1

## 2016-01-13 MED ORDER — ALTEPLASE 2 MG IJ SOLR
2.0000 mg | Freq: Once | INTRAMUSCULAR | Status: DC | PRN
  Filled 2016-01-13: qty 2

## 2016-01-13 NOTE — Assessment & Plan Note (Signed)
She is able to tolerate her nutritional supplement without nausea and vomiting. She has seen dietitian today  We will continue to monitor her weight on a regular basis. I encouraged her to try oral diet as tolerated.

## 2016-01-13 NOTE — Assessment & Plan Note (Signed)
She has controlled throat pain for several days with fentanyl patch and Dilaudid as needed I refill her prescription today. We will continue to same treatment for now

## 2016-01-13 NOTE — Progress Notes (Signed)
Nutrition follow-up completed with patient and daughter receiving IV fluids for tongue cancer. Weight is relatively stable and documented as 148.7 pounds on April 19 Patient reports she is tolerating Osmolite 1.5 at 60 mL an hour for 12 hours overnight plus one can Osmolite 1.5 - 3 times a day. Patient also tolerates 45 mL ProMod once daily.  Nutrition diagnosis:  Inadequate oral intake continues.   Diagnosis of inadequate enteral nutrition infusion resolved.  Estimated nutrition needs: 2040-2300 calories, 95-100 grams protein, 2.3 L fluid.  Intervention: Patient educated to give 60 cc free water before and after continuous in bolus feedings plus an additional 720 cc free water by mouth or tube. Tube feeding ProMod of free water flushes provide 2280 cal, 104 g protein, 2286 mL free water providing 100% of estimated needs. Patient has no nutrition related questions.  Monitoring, evaluation, goals: Patient will tolerate tube feeding at goal rate to promote healing and maintain current weight.  Next visit: To be scheduled as needed.  **Disclaimer: This note was dictated with voice recognition software. Similar sounding words can inadvertently be transcribed and this note may contain transcription errors which may not have been corrected upon publication of note.**

## 2016-01-13 NOTE — Assessment & Plan Note (Signed)
The patient has experienced significant improvement since the last time I saw her. I will stop her IV fluid support. She returns next week to follow with radiation oncologist. I will see her the following week for further supportive care

## 2016-01-13 NOTE — Telephone Encounter (Signed)
Gave pt appt & avs °

## 2016-01-13 NOTE — Patient Instructions (Signed)

## 2016-01-13 NOTE — Assessment & Plan Note (Signed)
This is likely anemia of chronic disease. The patient denies recent history of bleeding such as epistaxis, hematuria or hematochezia. She is asymptomatic from the anemia. We will observe for now.  She does not require transfusion now.   

## 2016-01-13 NOTE — Progress Notes (Signed)
Debra Farrell OFFICE PROGRESS NOTE  Patient Care Team: [name], MD as PCP - General [name], RN as Oncology Nurse Navigator [name], MD as Consulting Physician (Hematology and Oncology) [name], MD as Attending Physician (Radiation Oncology) [name], RD as Dietitian (Nutrition)  SUMMARY OF ONCOLOGIC HISTORY: Oncology History   Cancer of base of tongue [facility])   Staging form: Lip and Oral Cavity, AJCC 7th Edition     Clinical stage from 10/14/2015: Stage IVA (T2, N2b, M0) - Signed by [name], MD on 10/19/2015       Cancer of base of tongue (Debra Farrell)   09/29/2015 Pathology Results Accession: [id number] FNA right neck showed necrotic cells suspicious for squamous cell cancer   09/29/2015 Pathology Results Accession: [id number] Mouth biopsy showed pyogenic granuloma   10/08/2015 Imaging CT neck showed 1. 3 cm right tongue base mass consistent with malignancy.2. Necrotic right level II and III nodal metastases.3. Enlarged right thyroid lobe containing multiple nodules.   10/14/2015 Imaging PET scan showed 1. Hypermetabolic right tongue base mass with hypermetabolic right level II and III adenopathy. Small left level III lymph node appears mildly hypermetabolic as well.2. A few scattered tiny pulmonary nodules are too small for PETresolution   10/16/2015 Imaging [place] Head/Neck:  1) Multiple thyroid lesions bilaterally; dominant nodule in right lower pole measures 22 mm and meets fine-needle aspiration criteria. 2) Complex mass within the soft tissues of the right side of neck worrisome for metastatic malignancy.   10/26/2015 Pathology Results Accession [id number]:  Thyroid FNA - Consistent with benign follicular nodule.   10/28/2015 Surgery Multiple tooth extractions (26), right tongue base biopsy.   10/28/2015 Pathology Results Accession [id number]:  Tongue, right base -  Infiltrative SCC, p16 positive.  Actinomyces infection.   11/05/2015 Procedure Port-a-cath and PEG placed.    11/11/2015 -  Radiation Therapy She received radiation   11/11/2015 - 11/12/2015 Chemotherapy She received high dose cisplatin. Treatment was discontinued permanently due to severe side-effects   11/13/2015 Adverse Reaction She presented with intractable nausea and vomiting   11/13/2015 - 11/30/2015 Hospital Admission She was hospitalized for intractable nausea, vomiting and developed acute renal failure, reversed with aggressive IV fluid hydration. Subsequently, she was found to have intra-abdominal infection with MRSA requiring placement of drainage tube and IV ABx   11/24/2015 Imaging CT scan showed large abdominal abscess   11/25/2015 Procedure She had placement of drain   12/08/2015 Procedure Intra-abdominal drain was removed   12/08/2015 Imaging CT abdomen showed resolution of abscess and a new left lower base lung nodules    INTERVAL HISTORY: Please see below for problem oriented charting. She is seen today as part of her weekly supportive care. She denies further nausea or vomiting. She is able to tolerate 5 cans of nutritional supplement along with regular water flushes. She has some mild loose stool recently. Her throat pain appears to be under good control with current pain medication prescription.  REVIEW OF SYSTEMS:   Constitutional: Denies fevers, chills or abnormal weight loss Eyes: Denies blurriness of vision Respiratory: Denies cough, dyspnea or wheezes Cardiovascular: Denies palpitation, chest discomfort or lower extremity swelling Skin: Denies abnormal skin rashes Lymphatics: Denies new lymphadenopathy or easy bruising Neurological:Denies numbness, tingling or new weaknesses Behavioral/Psych: Mood is stable, no new changes  All other systems were reviewed with the patient and are negative.  I have reviewed the past medical history, past surgical history, social history and family history with the patient and  they are unchanged from previous note.  ALLERGIES:  is allergic to  morphine and related.  MEDICATIONS:  Current Outpatient Prescriptions  Medication Sig Dispense Refill  . Amino Acids-Protein Hydrolys (FEEDING SUPPLEMENT, PRO-STAT SUGAR FREE 64,) LIQD Place 30 mLs into feeding tube daily. 900 mL 0  . doxepin (SINEQUAN) 25 MG capsule Take 1 capsule (25 mg total) by mouth every 6 (six) hours. 30 capsule 2  . HYDROmorphone (DILAUDID) 4 MG tablet Take 1 tablet (4 mg total) by mouth every 4 (four) hours as needed for severe pain. 90 tablet 0  . Nutritional Supplements (FEEDING SUPPLEMENT, OSMOLITE 1.5 CAL,) LIQD Osmolite 1.5 at 60 mL for 12 hours from 7p-7a. 1 can Osmolite 1.5 TID at 9,12,3 with 60 mL free water before and after bolus and continuous feedings. 44mPromod with 240 cc free water once daily. Additional 362mfree water flush daily 1422 mL 0  . Water For Irrigation, Sterile (FREE WATER) SOLN Place 100 mLs into feeding tube 4 (four) times daily.    . fentaNYL (DURAGESIC - DOSED MCG/HR) 12 MCG/HR Place 1 patch (12.5 mcg total) onto the skin every 3 (three) days. To be added to the other prescription for total 37 mcg 5 patch 0  . fentaNYL (DURAGESIC - DOSED MCG/HR) 25 MCG/HR patch Place 1 patch (25 mcg total) onto the skin every 3 (three) days. To be added to the other prescription for total 37 mcg 5 patch 0   No current facility-administered medications for this visit.   Facility-Administered Medications Ordered in Other Visits  Medication Dose Route Frequency Provider Last Rate Last Dose  . alteplase (CATHFLO ACTIVASE) injection 2 mg  2 mg Intracatheter Once PRN NiHeath LarkMD      . heparin lock flush 100 unit/mL  500 Units Intracatheter Once PRN NiHeath LarkMD      . sodium chloride 0.9 % injection 10 mL  10 mL Intracatheter PRN NiHeath LarkMD        PHYSICAL EXAMINATION: ECOG PERFORMANCE STATUS: 1 - Symptomatic but completely ambulatory  Filed Vitals:   01/13/16 1146  BP: 146/66  Pulse: 96  Temp: 98.4 F (36.9 C)  Resp: 18   Filed Weights    01/13/16 1146  Weight: 148 lb 11.2 oz (67.45 kg)    GENERAL:alert, no distress and comfortable SKIN: skin color, texture, turgor are normal, no rashes or significant lesions EYES: normal, Conjunctiva are pink and non-injected, sclera clear OROPHARYNX:no exudate, no erythema and lips, buccal mucosa, and tongue normal  NECK: supple, thyroid normal size, non-tender, without nodularity LYMPH:  She has persistent lymphadenopathy in the right side of the neck. LUNGS: clear to auscultation and percussion with normal breathing effort HEART: regular rate & rhythm and no murmurs and no lower extremity edema ABDOMEN:abdomen soft, non-tender and normal bowel sounds Musculoskeletal:no cyanosis of digits and no clubbing  NEURO: alert & oriented x 3 with fluent speech, no focal motor/sensory deficits  LABORATORY DATA:  I have reviewed the data as listed    Component Value Date/Time   NA 135* 01/13/2016 1128   NA 132* 01/05/2016 0445   K 4.0 01/13/2016 1128   K 3.4* 01/05/2016 0445   CL 94* 01/05/2016 0445   CO2 30* 01/13/2016 1128   CO2 30 01/05/2016 0445   GLUCOSE 93 01/13/2016 1128   GLUCOSE 172* 01/05/2016 0445   BUN 18.6 01/13/2016 1128   BUN 13 01/05/2016 0445   CREATININE 1.0 01/13/2016 1128   CREATININE 0.76 01/05/2016 0445  CREATININE 1.04 04/07/2015 1536   CALCIUM 9.5 01/13/2016 1128   CALCIUM 8.8* 01/05/2016 0445   PROT 6.3* 01/13/2016 1128   PROT 5.7* 12/31/2015 0130   ALBUMIN 2.8* 01/13/2016 1128   ALBUMIN 2.7* 12/31/2015 0130   AST 24 01/13/2016 1128   AST 27 12/31/2015 0130   ALT 16 01/13/2016 1128   ALT 31 12/31/2015 0130   ALKPHOS 74 01/13/2016 1128   ALKPHOS 73 12/31/2015 0130   BILITOT 0.52 01/13/2016 1128   BILITOT 1.2 12/31/2015 0130   GFRNONAA >60 01/05/2016 0445   GFRAA >60 01/05/2016 0445    No results found for: SPEP, UPEP  Lab Results  Component Value Date   WBC 6.7 01/13/2016   NEUTROABS 5.4 01/13/2016   HGB 8.5* 01/13/2016   HCT 26.1*  01/13/2016   MCV 71.9* 01/13/2016   PLT 337 01/13/2016      Chemistry      Component Value Date/Time   NA 135* 01/13/2016 1128   NA 132* 01/05/2016 0445   K 4.0 01/13/2016 1128   K 3.4* 01/05/2016 0445   CL 94* 01/05/2016 0445   CO2 30* 01/13/2016 1128   CO2 30 01/05/2016 0445   BUN 18.6 01/13/2016 1128   BUN 13 01/05/2016 0445   CREATININE 1.0 01/13/2016 1128   CREATININE 0.76 01/05/2016 0445   CREATININE 1.04 04/07/2015 1536      Component Value Date/Time   CALCIUM 9.5 01/13/2016 1128   CALCIUM 8.8* 01/05/2016 0445   ALKPHOS 74 01/13/2016 1128   ALKPHOS 73 12/31/2015 0130   AST 24 01/13/2016 1128   AST 27 12/31/2015 0130   ALT 16 01/13/2016 1128   ALT 31 12/31/2015 0130   BILITOT 0.52 01/13/2016 1128   BILITOT 1.2 12/31/2015 0130      ASSESSMENT & PLAN:  Cancer of base of tongue ([place]) The patient has experienced significant improvement since the last time I saw her. I will stop her IV fluid support. She returns next week to follow with radiation oncologist. I will see her the following week for further supportive care  Throat pain in adult She has controlled throat pain for several days with fentanyl patch and Dilaudid as needed I refill her prescription today. We will continue to same treatment for now  Severe protein-calorie malnutrition ([place]) She is able to tolerate her nutritional supplement without nausea and vomiting. She has seen dietitian today  We will continue to monitor her weight on a regular basis. I encouraged her to try oral diet as tolerated.  Anemia due to other cause This is likely anemia of chronic disease. The patient denies recent history of bleeding such as epistaxis, hematuria or hematochezia. She is asymptomatic from the anemia. We will observe for now.  She does not require transfusion now.     Orders Placed This Encounter  Procedures  . CBC with Differential/Platelet    Standing Status: Future     Number of Occurrences:       Standing Expiration Date: 02/16/2017  . Hold Tube, Blood Bank    Standing Status: Future     Number of Occurrences:      Standing Expiration Date: 02/16/2017   All questions were answered. The patient knows to call the clinic with any problems, questions or concerns. No barriers to learning was detected. I spent 15 minutes counseling the patient face to face. The total time spent in the appointment was 20 minutes and more than 50% was on counseling and review of test results  [facility], [place], MD 01/13/2016 1:52 PM

## 2016-01-13 NOTE — Patient Instructions (Signed)

## 2016-01-14 ENCOUNTER — Ambulatory Visit: Payer: Self-pay

## 2016-01-14 MED FILL — fentaNYL 25 MCG/HR PT72: 25 | 15 days supply | Qty: 5 | Fill #0

## 2016-01-15 ENCOUNTER — Ambulatory Visit: Payer: Self-pay

## 2016-01-15 NOTE — Progress Notes (Signed)
Debra Farrell presents for radiation completed 01/05/16 to her Base of Tongue.     Pain issues, if any: She complains of pain a 6/10 right in the front of her throat. She is using 37.5 mcg of fentanyl patch and 4 mg of dilaudid once daily. Using a feeding tube?: Yes, She is instilling 6 cans of osmolite daily with 48 ounces of water daily. She also does one package of pro-stat each day.  Weight changes, if any:  Wt Readings from Last 3 Encounters:  01/20/16 145 lb 3.2 oz (65.862 kg)  01/13/16 148 lb 11.2 oz (67.45 kg)  01/05/16 149 lb 9.6 oz (67.858 kg)   Swallowing issues, if any: She is not swallowing anything orally. Smoking or chewing tobacco? No Using fluoride trays daily? No Last ENT visit was on: Not since diagnosis, Dr. Erik Obey is her ENT. Other notable issues, if any:  Her skin is hyperpigmented and dry. She is still using the sonafine twice daily.   BP 113/58 mmHg  Pulse 97  Temp(Src) 98.5 F (36.9 C)  Ht '5\' 6"'$  (1.676 m)  Wt 145 lb 3.2 oz (65.862 kg)  BMI 23.45 kg/m2  SpO2 99%

## 2016-01-16 ENCOUNTER — Ambulatory Visit: Payer: Self-pay

## 2016-01-17 NOTE — Progress Notes (Signed)
Radiation Oncology         [phone] ________________________________  Name: Debra Farrell MRN: [id number]  Date: 01/05/2016  DOB: 03-08-1946  End of Treatment Note  Diagnosis:    ICD-9-CM ICD-10-CM   1. 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  She also was treated for an abscess secondary to leaking PEG tube.  She completed the full course of radiotherapy.    Plan: The patient has completed radiation treatment. The patient will return to radiation oncology clinic for routine followup in one half month. I advised them to call or return sooner if they have any questions or concerns related to their recovery or treatment.  -----------------------------------  Lonie Peak, MD

## 2016-01-18 ENCOUNTER — Encounter: Payer: Self-pay | Admitting: Hematology and Oncology

## 2016-01-18 MED FILL — fentaNYL 12 MCG/HR PT72: 12 | 10 days supply | Qty: 10 | Fill #0

## 2016-01-19 ENCOUNTER — Other Ambulatory Visit: Payer: Self-pay | Admitting: Hematology and Oncology

## 2016-01-19 MED FILL — ONDANSETRON HCL 8 MG TABLET: 8 | 15 days supply | Qty: 30 | Fill #0

## 2016-01-20 ENCOUNTER — Ambulatory Visit
Admit: 2016-01-20 | Discharge: 2016-01-20 | Disposition: A | Discharge status: Home / Self Care | Payer: 59 | Attending: Radiation Oncology | Admitting: Radiation Oncology

## 2016-01-20 ENCOUNTER — Telehealth: Payer: Self-pay | Admitting: *Deleted

## 2016-01-20 ENCOUNTER — Encounter: Payer: Self-pay | Admitting: *Deleted

## 2016-01-20 ENCOUNTER — Encounter: Payer: Self-pay | Admitting: Radiation Oncology

## 2016-01-20 VITALS — BP 113/58 | HR 97 | Temp 98.5°F | Ht 66.0 in | Wt 145.2 lb

## 2016-01-20 DIAGNOSIS — C01 Malignant neoplasm of base of tongue: Secondary | ICD-10-CM

## 2016-01-20 NOTE — Telephone Encounter (Signed)
XXXX 

## 2016-01-20 NOTE — Progress Notes (Signed)
  Oncology Nurse Navigator Documentation  Navigator Location: [facility]Med [facility]c (01/20/16 1640) Navigator Encounter Type: Clinic/MDC (01/20/16 1640)           Patient Visit Type: RadOnc (01/20/16 1640) Treatment Phase: Post-Tx Follow-up (01/20/16 1640) Barriers/Navigation Needs: No barriers at this time;No Questions;No Needs (01/20/16 1640)     Met with Ms. Mcgahee and her dtr during post-XRT follow-up with Dr. Isidore Moos.  She completed XRT 2 weeks ago.  She reported:  No oral nutritional intake; relying on PEG for 6 cans Osmolite daily.  Occasional reflux after PEG feeding.  Absence of taste sensation.  Somewhat increased energy but generalized fatigue present.  Applying Sonafine to neck BID.  Minimal throat soreness with swallowing.  Absence of thickened saliva.  I discussed with her the timeframe of SE recovery, noting in particular to expect a gradual return of taste and energy over a period of 3-12 months.  She voiced understanding that she will have a CT chest and neck in 3-4 weeks to further gauge tmt response.  She understands she can contact me with needs/concerns.  Gayleen Orem, RN, BSN, [facility] [place] [phone]                         Time Spent with Patient: 30 (01/20/16 1640)

## 2016-01-22 NOTE — Progress Notes (Signed)
Radiation Oncology         [phone] ________________________________  Name: Debra Farrell MRN: [id number]  Date: 01/20/2016  DOB: December 29, 1945  Follow-Up Visit Note  CC: Debra Ran, MD  Debra Shanks, MD  Diagnosis and Prior Radiotherapy:       ICD-9-CM ICD-10-CM   1. Cancer of base of tongue (HCC) 141.0 C01 CT Soft Tissue Neck W Contrast     CT Chest W Contrast   Narrative:  The patient returns today for routine follow-up.                     Debra Farrell presents for radiation completed 01/05/16 to her Base of Tongue.    Pain issues, if any: She complains of pain a 6/10 right in the front of her throat. She is using 37.5 mcg of fentanyl patch and 4 mg of dilaudid once daily. Using a feeding tube?: Yes, She is instilling 6 cans of osmolite daily with 48 ounces of water daily. She also does one package of pro-stat each day.  Weight changes, if any:  Wt Readings from Last 3 Encounters:  01/20/16 145 lb 3.2 oz (65.862 kg)  01/13/16 148 lb 11.2 oz (67.45 kg)  01/05/16 149 lb 9.6 oz (67.858 kg)   Swallowing issues, if any: She is not swallowing anything orally. Smoking or chewing tobacco? No Using fluoride trays daily? No Last ENT visit was on: Not since diagnosis, Debra Farrell is her ENT. Other notable issues, if any:  Her skin is hyperpigmented and dry. She is still using the sonafine twice daily.    ALLERGIES:  is allergic to morphine and related.  Meds: Current Outpatient Prescriptions  Medication Sig Dispense Refill  . Amino Acids-Protein Hydrolys (FEEDING SUPPLEMENT, PRO-STAT SUGAR FREE 64,) LIQD Place 30 mLs into feeding tube daily. 900 mL 0  . doxepin (SINEQUAN) 25 MG capsule Take 1 capsule (25 mg total) by mouth every 6 (six) hours. 30 capsule 2  . fentaNYL (DURAGESIC - DOSED MCG/HR) 12 MCG/HR Place 1 patch (12.5 mcg total) onto the skin every 3 (three) days. To be added to the other prescription for total 37 mcg 5 patch 0  . fentaNYL (DURAGESIC - DOSED MCG/HR)  25 MCG/HR patch Place 1 patch (25 mcg total) onto the skin every 3 (three) days. To be added to the other prescription for total 37 mcg 5 patch 0  . HYDROmorphone (DILAUDID) 4 MG tablet Take 1 tablet (4 mg total) by mouth every 4 (four) hours as needed for severe pain. 90 tablet 0  . Nutritional Supplements (FEEDING SUPPLEMENT, OSMOLITE 1.5 CAL,) LIQD Osmolite 1.5 at 60 mL for 12 hours from 7p-7a. 1 can Osmolite 1.5 TID at 9,12,3 with 60 mL free water before and after bolus and continuous feedings. with 240 cc free water once daily. Additional free water flush daily 1422 mL 0  . ondansetron (ZOFRAN) 8 MG tablet TAKE 1 TABLET BY MOUTH 2 TIMES DAILY AS NEEDED. START ON THE THIRD DAY AFTER CHEMOTHERAPY. 30 tablet 1  . Water For Irrigation, Sterile (FREE WATER) SOLN Place 100 mLs into feeding tube 4 (four) times daily.     No current facility-administered medications for this encounter.    Physical Findings: The patient is in no acute distress. Patient is alert and oriented. Wt Readings from Last 3 Encounters:  01/20/16 145 lb 3.2 oz (65.862 kg)  01/13/16 148 lb 11.2 oz (67.45 kg)  01/05/16 149 lb 9.6 oz (67.858 kg)  height is 5\' 6"  (1.676 m) and weight is 145 lb 3.2 oz (65.862 kg). Her temperature is 98.5 F (36.9 C). Her blood pressure is 113/58 and her pulse is 97. Her oxygen saturation is 99%. .  General: Alert and oriented, in no acute distress HEENT: Head is normocephalic. Extraocular movements are intact. Oropharynx is notable for no visible thrush or tumor. Neck: Neck is notable for palpable right upper neck mass; skin with dry desquamation Skin: Skin in treatment fields still dry and hyperpigmented Heart: Regular in rate and rhythm with no murmurs, rubs, or gallops. Chest: Clear to auscultation bilaterally, with no rhonchi, wheezes, or rales. Lymphatics: see Neck Exam Psychiatric: Judgment and insight are intact. Affect is appropriate.   Lab Findings: Lab Results    Component Value Date   WBC 6.7 01/13/2016   HGB 8.5* 01/13/2016   HCT 26.1* 01/13/2016   MCV 71.9* 01/13/2016   PLT 337 01/13/2016    Lab Results  Component Value Date   TSH 0.318* 12/31/2015    Radiographic Findings: Dg Chest 2 View  12/30/2015  CLINICAL DATA:  Lethargic.  Near syncope.  Confusion. EXAM: CHEST  2 VIEW COMPARISON:  11/22/2015 FINDINGS: There is a right jugular Port-A-Cath with tip in the SVC just below the azygos vein junction. The lungs are clear. There is no effusion. Hilar and mediastinal contours are unremarkable and unchanged. Heart size is normal, unchanged. IMPRESSION: No active cardiopulmonary disease. Electronically Signed   By: Debra Farrell M.D.   On: 12/30/2015 19:59   Ct Abdomen Pelvis W Contrast  12/31/2015  CLINICAL DATA:  New onset syncope.  Worsening weakness.  Sepsis. EXAM: CT ABDOMEN AND PELVIS WITH CONTRAST TECHNIQUE: Multidetector CT imaging of the abdomen and pelvis was performed using the standard protocol following bolus administration of intravenous contrast. CONTRAST:  ISOVUE-300 IOPAMIDOL (ISOVUE-300) INJECTION 61% COMPARISON:  12/08/2015 FINDINGS: Lower chest: Left lower lobe pulmonary nodule is now 13 mm, enlarged from 12/08/2015 when it measured 8 mm. Mild atelectatic appearing linear basilar opacities are present bilaterally. Hepatobiliary: Liver appears normal. Bile ducts are normal. Multiple calculi within the gallbladder. No gallbladder wall thickening or pericholecystic edema. Pancreas: Normal Spleen: Normal, with sub cm focal hypodensity which is unchanged. Adrenals/Urinary Tract: The adrenals and kidneys are normal in appearance with the exception of an unchanged 10 mm hypodensity in the right renal midpole, likely a cyst. There is no urinary calculus evident. There is no hydronephrosis or ureteral dilatation. Collecting systems and ureters appear unremarkable. Stomach/Bowel: Percutaneous gastrostomy tube. There are normal appearances  of the stomach, small bowel and colon. The appendix is normal. Vascular/Lymphatic: The abdominal aorta is normal in caliber. There is mild atherosclerotic calcification. There is no adenopathy in the abdomen or pelvis. Reproductive: Hysterectomy.  No adnexal abnormalities. Other: The epigastric drain has been removed. No recurrent abscess. No focal inflammatory changes in the abdomen or pelvis. No ascites. Musculoskeletal: No significant skeletal lesions. IMPRESSION: 1. Enlarging left lower lobe pulmonary nodule. This could represent a hematogenous metastasis although the rate of enlargement is faster than typical. 2. Complete resolution of the anterior abdominal abscess. The drainage catheter has been removed and there is no evidence of recurrent or residual collection. 3. No inflammatory changes are evident in the abdomen or pelvis. No adenopathy. No ascites. 4. Cholelithiasis. Electronically Signed   By: Debra Farrell M.D.   On: 12/31/2015 00:41   Ir Patient Eval Tech 0-60 Mins  12/30/2015  Kathee Polite     12/30/2015  9:07 AM Patient came to IR with port flipped upside down.  Port was successfully manipulated into proper position without difficulty.  Patient stated that she was finished with all appointments at the [facility] today, but would be back tomorrow for fluids.  Dr. Deanne Coffer was informed.  Patient was instructed to call the [facility] IR department in the morning, if the portacath felt like it had flipped again so we could fix it and access it for the Cancer Center appointment.  Patient given an appointment card with the [facility] IR contact information and voiced understanding of the instructions.  Patient's PIV was removed and patient sent home.   Impression/Plan:    1) Head and Neck Cancer Status: healing; risk for persistent neck disease, but this can continue to regress.  Prior imaging concerning for possible metastatic disease to lung.  2) Nutritional Status: some weight loss this month  (4lb); notify Barb Neff PEG tube: all by PEG.  3) Risk Factors: The patient has been educated about risk factors including alcohol and tobacco abuse; they understand that avoidance of alcohol and tobacco is important to prevent recurrences as well as other cancers  4) Swallowing: not swallowing currently.  Notify [employer].  5) Dental: no active issues  6) Thyroid function: follow Lab Results  Component Value Date   TSH 0.318* 12/31/2015    7) Other: apply sonafine to front and back of neck at least BID  8) Follow-up in 3 weeks with CT neck/chest to reassess. The patient was encouraged to call with any issues or questions before then.  _____________________________________   Lonie Peak, MD

## 2016-01-25 ENCOUNTER — Telehealth: Payer: Self-pay | Admitting: *Deleted

## 2016-01-25 NOTE — Telephone Encounter (Signed)
CALLED PATIENT TO INFORM OF CT ON 02-16-16 AND HER FU VISIT WITH DR. Isidore Moos ON 02-17-16, SPOKE WITH PATIENT AND SHE IS AWARE OF THESE APPTS.

## 2016-01-26 ENCOUNTER — Other Ambulatory Visit: Payer: Self-pay

## 2016-01-27 ENCOUNTER — Other Ambulatory Visit (HOSPITAL_BASED_OUTPATIENT_CLINIC_OR_DEPARTMENT_OTHER): Payer: 59

## 2016-01-27 ENCOUNTER — Ambulatory Visit: Payer: 59

## 2016-01-27 ENCOUNTER — Ambulatory Visit (HOSPITAL_BASED_OUTPATIENT_CLINIC_OR_DEPARTMENT_OTHER): Payer: 59 | Admitting: Hematology and Oncology

## 2016-01-27 ENCOUNTER — Encounter: Payer: Self-pay | Admitting: Hematology and Oncology

## 2016-01-27 VITALS — BP 131/56 | HR 98 | Temp 99.0°F | Resp 19 | Wt 143.4 lb

## 2016-01-27 DIAGNOSIS — D6489 Other specified anemias: Secondary | ICD-10-CM

## 2016-01-27 DIAGNOSIS — C01 Malignant neoplasm of base of tongue: Secondary | ICD-10-CM

## 2016-01-27 DIAGNOSIS — IMO0002 Reserved for concepts with insufficient information to code with codable children: Secondary | ICD-10-CM

## 2016-01-27 DIAGNOSIS — E43 Unspecified severe protein-calorie malnutrition: Secondary | ICD-10-CM

## 2016-01-27 DIAGNOSIS — R070 Pain in throat: Secondary | ICD-10-CM

## 2016-01-27 DIAGNOSIS — A4902 Methicillin resistant Staphylococcus aureus infection, unspecified site: Secondary | ICD-10-CM

## 2016-01-27 DIAGNOSIS — R1310 Dysphagia, unspecified: Secondary | ICD-10-CM | POA: Diagnosis not present

## 2016-01-27 LAB — CBC WITH DIFFERENTIAL/PLATELET
BASO%: 0.2 % (ref 0.0–2.0)
BASOS ABS: 0 10*3/uL (ref 0.0–0.1)
EOS%: 0.4 % (ref 0.0–7.0)
Eosinophils Absolute: 0 10*3/uL (ref 0.0–0.5)
HCT: 28.9 % — ABNORMAL LOW (ref 34.8–46.6)
HEMOGLOBIN: 9.2 g/dL — AB (ref 11.6–15.9)
LYMPH%: 6.1 % — AB (ref 14.0–49.7)
MCH: 22.7 pg — AB (ref 25.1–34.0)
MCHC: 31.8 g/dL (ref 31.5–36.0)
MCV: 71.4 fL — AB (ref 79.5–101.0)
MONO#: 0.8 10*3/uL (ref 0.1–0.9)
MONO%: 9.5 % (ref 0.0–14.0)
NEUT#: 6.9 10*3/uL — ABNORMAL HIGH (ref 1.5–6.5)
NEUT%: 83.8 % — ABNORMAL HIGH (ref 38.4–76.8)
Platelets: 298 10*3/uL (ref 145–400)
RBC: 4.05 10*6/uL (ref 3.70–5.45)
RDW: 21.5 % — AB (ref 11.2–14.5)
WBC: 8.2 10*3/uL (ref 3.9–10.3)
lymph#: 0.5 10*3/uL — ABNORMAL LOW (ref 0.9–3.3)

## 2016-01-27 LAB — COMPREHENSIVE METABOLIC PANEL
ALK PHOS: 69 U/L (ref 40–150)
ALT: 17 U/L (ref 0–55)
ANION GAP: 8 meq/L (ref 3–11)
AST: 22 U/L (ref 5–34)
Albumin: 3.3 g/dL — ABNORMAL LOW (ref 3.5–5.0)
BILIRUBIN TOTAL: 0.5 mg/dL (ref 0.20–1.20)
BUN: 27.8 mg/dL — ABNORMAL HIGH (ref 7.0–26.0)
CO2: 28 mEq/L (ref 22–29)
CREATININE: 1.1 mg/dL (ref 0.6–1.1)
Calcium: 9.6 mg/dL (ref 8.4–10.4)
Chloride: 97 mEq/L — ABNORMAL LOW (ref 98–109)
EGFR: 51 mL/min/{1.73_m2} — AB (ref >90)
GLUCOSE: 242 mg/dL — AB (ref 70–140)
Potassium: 4 mEq/L (ref 3.5–5.1)
Sodium: 133 mEq/L — ABNORMAL LOW (ref 136–145)
TOTAL PROTEIN: 6.5 g/dL (ref 6.4–8.3)

## 2016-01-27 LAB — MAGNESIUM: Magnesium: 2.1 mg/dl (ref 1.5–2.5)

## 2016-01-27 MED ORDER — SODIUM CHLORIDE 0.9 % IJ SOLN
10.0000 mL | INTRAMUSCULAR | Status: DC | PRN
  Administered 2016-01-27: 10 mL via INTRAVENOUS
  Filled 2016-01-27: qty 10

## 2016-01-27 NOTE — Assessment & Plan Note (Addendum)
Pain control is excellent. The patient has started weaning herself off the fentanyl patch and will continue using Dilaudid as needed for breakthrough pain

## 2016-01-27 NOTE — Assessment & Plan Note (Addendum)
She is able to tolerate her nutritional supplement without nausea and vomiting. Serum albumin is improving and she is maintaining her weight We will continue to monitor her weight on a regular basis.

## 2016-01-27 NOTE — Assessment & Plan Note (Signed)
She has persistent dysphagia. I will get her to see speech and language therapist for assessment

## 2016-01-27 NOTE — Progress Notes (Signed)
[place] OFFICE PROGRESS NOTE  Patient Care Team: [name], MD as PCP - General [name], RN as Oncology Nurse Navigator [name], MD as Consulting Physician (Hematology and Oncology) [name], MD as Attending Physician (Radiation Oncology) [name], RD as Dietitian (Nutrition)  SUMMARY OF ONCOLOGIC HISTORY: Oncology History   Cancer of base of tongue [facility])   Staging form: Lip and Oral Cavity, AJCC 7th Edition     Clinical stage from 10/14/2015: Stage IVA (T2, N2b, M0) - Signed by [name], MD on 10/19/2015       Cancer of base of tongue ([place])   09/29/2015 Pathology Results Accession: [id number] FNA right neck showed necrotic cells suspicious for squamous cell cancer   09/29/2015 Pathology Results Accession: [id number] Mouth biopsy showed pyogenic granuloma   10/08/2015 Imaging CT neck showed 1. 3 cm right tongue base mass consistent with malignancy.2. Necrotic right level II and III nodal metastases.3. Enlarged right thyroid lobe containing multiple nodules.   10/14/2015 Imaging PET scan showed 1. Hypermetabolic right tongue base mass with hypermetabolic right level II and III adenopathy. Small left level III lymph node appears mildly hypermetabolic as well.2. A few scattered tiny pulmonary nodules are too small for PETresolution   10/16/2015 Imaging [place] Head/Neck:  1) Multiple thyroid lesions bilaterally; dominant nodule in right lower pole measures 22 mm and meets fine-needle aspiration criteria. 2) Complex mass within the soft tissues of the right side of neck worrisome for metastatic malignancy.   10/26/2015 Pathology Results Accession [id number]:  Thyroid FNA - Consistent with benign follicular nodule.   10/28/2015 Surgery Multiple tooth extractions (26), right tongue base biopsy.   10/28/2015 Pathology Results Accession [id number]:  Tongue, right base -  Infiltrative SCC, p16 positive.  Actinomyces infection.   11/05/2015 Procedure Port-a-cath and PEG placed.    11/11/2015 -  Radiation Therapy She received radiation   11/11/2015 - 11/12/2015 Chemotherapy She received high dose cisplatin. Treatment was discontinued permanently due to severe side-effects   11/13/2015 Adverse Reaction She presented with intractable nausea and vomiting   11/13/2015 - 11/30/2015 Hospital Admission She was hospitalized for intractable nausea, vomiting and developed acute renal failure, reversed with aggressive IV fluid hydration. Subsequently, she was found to have intra-abdominal infection with MRSA requiring placement of drainage tube and IV ABx   11/24/2015 Imaging CT scan showed large abdominal abscess   11/25/2015 Procedure She had placement of drain   12/08/2015 Procedure Intra-abdominal drain was removed   12/08/2015 Imaging CT abdomen showed resolution of abscess and a new left lower base lung nodules    INTERVAL HISTORY: Please see below for problem oriented charting. She returns for her supportive care visit. She is not able to swallow anything by mouth. She is dependent on nutritional supplement via feeding tube. She is able to tolerate 5-6 cans of nutritional supplement along with water flushes. Pain control is excellent. She is interested to start tapering her self off the fentanyl patch over the next few weeks. She denies nausea or vomiting. Denies recent leak around the feeding tube  REVIEW OF SYSTEMS:   Constitutional: Denies fevers, chills or abnormal weight loss Eyes: Denies blurriness of vision Respiratory: Denies cough, dyspnea or wheezes Cardiovascular: Denies palpitation, chest discomfort or lower extremity swelling Gastrointestinal:  Denies nausea, heartburn or change in bowel habits Skin: Denies abnormal skin rashes Lymphatics: Denies new lymphadenopathy or easy bruising Neurological:Denies numbness, tingling or new weaknesses Behavioral/Psych: Mood is stable, no new changes  All other  systems were reviewed with the patient and are negative.  I have  reviewed the past medical history, past surgical history, social history and family history with the patient and they are unchanged from previous note.  ALLERGIES:  is allergic to morphine and related.  MEDICATIONS:  Current Outpatient Prescriptions  Medication Sig Dispense Refill  . Amino Acids-Protein Hydrolys (FEEDING SUPPLEMENT, PRO-STAT SUGAR FREE 64,) LIQD Place 30 mLs into feeding tube daily. 900 mL 0  . doxepin (SINEQUAN) 25 MG capsule Take 1 capsule (25 mg total) by mouth every 6 (six) hours. 30 capsule 2  . fentaNYL (DURAGESIC - DOSED MCG/HR) 12 MCG/HR Place 1 patch (12.5 mcg total) onto the skin every 3 (three) days. To be added to the other prescription for total 37 mcg 5 patch 0  . fentaNYL (DURAGESIC - DOSED MCG/HR) 25 MCG/HR patch Place 1 patch (25 mcg total) onto the skin every 3 (three) days. To be added to the other prescription for total 37 mcg 5 patch 0  . HYDROmorphone (DILAUDID) 4 MG tablet Take 1 tablet (4 mg total) by mouth every 4 (four) hours as needed for severe pain. 90 tablet 0  . Nutritional Supplements (FEEDING SUPPLEMENT, OSMOLITE 1.5 CAL,) LIQD Osmolite 1.5 at 60 mL for 12 hours from 7p-7a. 1 can Osmolite 1.5 TID at 9,12,3 with 60 mL free water before and after bolus and continuous feedings. 49mPromod with 240 cc free water once daily. Additional 3669mfree water flush daily 1422 mL 0  . ondansetron (ZOFRAN) 8 MG tablet TAKE 1 TABLET BY MOUTH 2 TIMES DAILY AS NEEDED. START ON THE THIRD DAY AFTER CHEMOTHERAPY. 30 tablet 1  . Water For Irrigation, Sterile (FREE WATER) SOLN Place 100 mLs into feeding tube 4 (four) times daily.     No current facility-administered medications for this visit.    PHYSICAL EXAMINATION: ECOG PERFORMANCE STATUS: 1 - Symptomatic but completely ambulatory  Filed Vitals:   01/27/16 1048  BP: 131/56  Pulse: 98  Temp: 99 F (37.2 C)  Resp: 19   Filed Weights   01/27/16 1048  Weight: 143 lb 6.4 oz (65.046 kg)    GENERAL:alert, no  distress and comfortable SKIN: skin color, texture, turgor are normal, no rashes or significant lesions. Prior skin rash has resolved EYES: normal, Conjunctiva are pink and non-injected, sclera clear OROPHARYNX:no exudate, no erythema and lips, buccal mucosa, and tongue normal  NECK: supple, thyroid normal size, non-tender, without nodularity LYMPH:  Lymphadenopathy on the right side of the neck is getting smaller  NEURO: alert & oriented x 3 with fluent speech, no focal motor/sensory deficits  LABORATORY DATA:  I have reviewed the data as listed    Component Value Date/Time   NA 133* 01/27/2016 1014   NA 132* 01/05/2016 0445   K 4.0 01/27/2016 1014   K 3.4* 01/05/2016 0445   CL 94* 01/05/2016 0445   CO2 28 01/27/2016 1014   CO2 30 01/05/2016 0445   GLUCOSE 242* 01/27/2016 1014   GLUCOSE 172* 01/05/2016 0445   BUN 27.8* 01/27/2016 1014   BUN 13 01/05/2016 0445   CREATININE 1.1 01/27/2016 1014   CREATININE 0.76 01/05/2016 0445   CREATININE 1.04 04/07/2015 1536   CALCIUM 9.6 01/27/2016 1014   CALCIUM 8.8* 01/05/2016 0445   PROT 6.5 01/27/2016 1014   PROT 5.7* 12/31/2015 0130   ALBUMIN 3.3* 01/27/2016 1014   ALBUMIN 2.7* 12/31/2015 0130   AST 22 01/27/2016 1014   AST 27 12/31/2015 0130   ALT 17  01/27/2016 1014   ALT 31 12/31/2015 0130   ALKPHOS 69 01/27/2016 1014   ALKPHOS 73 12/31/2015 0130   BILITOT 0.50 01/27/2016 1014   BILITOT 1.2 12/31/2015 0130   GFRNONAA >60 01/05/2016 0445   GFRAA >60 01/05/2016 0445    No results found for: SPEP, UPEP  Lab Results  Component Value Date   WBC 8.2 01/27/2016   NEUTROABS 6.9* 01/27/2016   HGB 9.2* 01/27/2016   HCT 28.9* 01/27/2016   MCV 71.4* 01/27/2016   PLT 298 01/27/2016      Chemistry      Component Value Date/Time   NA 133* 01/27/2016 1014   NA 132* 01/05/2016 0445   K 4.0 01/27/2016 1014   K 3.4* 01/05/2016 0445   CL 94* 01/05/2016 0445   CO2 28 01/27/2016 1014   CO2 30 01/05/2016 0445   BUN 27.8* 01/27/2016  1014   BUN 13 01/05/2016 0445   CREATININE 1.1 01/27/2016 1014   CREATININE 0.76 01/05/2016 0445   CREATININE 1.04 04/07/2015 1536      Component Value Date/Time   CALCIUM 9.6 01/27/2016 1014   CALCIUM 8.8* 01/05/2016 0445   ALKPHOS 69 01/27/2016 1014   ALKPHOS 73 12/31/2015 0130   AST 22 01/27/2016 1014   AST 27 12/31/2015 0130   ALT 17 01/27/2016 1014   ALT 31 12/31/2015 0130   BILITOT 0.50 01/27/2016 1014   BILITOT 1.2 12/31/2015 0130      ASSESSMENT & PLAN:  Cancer of base of tongue ([place]) The patient has experienced significant improvement since the last time I saw her. She returns at the end of the month to follow with radiation oncologist with repeat CT scan of the neck and chest. I will try to coordinate appointment to see her back as well to review test results. If the lung nodule is confined to a small area, I would recommend SBRT rather than systemic chemotherapy.   Severe protein-calorie malnutrition ([place]) She is able to tolerate her nutritional supplement without nausea and vomiting. Serum albumin is improving and she is maintaining her weight We will continue to monitor her weight on a regular basis.  Anemia due to other cause This is likely anemia of chronic disease with background of thalassemia. The patient denies recent history of bleeding such as epistaxis, hematuria or hematochezia. She is asymptomatic from the anemia. We will observe for now.  She does not require transfusion now.    Throat pain in adult Pain control is excellent. The patient has started weaning herself off the fentanyl patch and will continue using Dilaudid as needed for breakthrough pain  Dysphagia She has persistent dysphagia. I will get her to see speech and language therapist for assessment   No orders of the defined types were placed in this encounter.   All questions were answered. The patient knows to call the clinic with any problems, questions or concerns. No barriers to  learning was detected. I spent 15 minutes counseling the patient face to face. The total time spent in the appointment was 20 minutes and more than 50% was on counseling and review of test results     [facility], [place], MD 01/27/2016 11:09 AM

## 2016-01-27 NOTE — Assessment & Plan Note (Signed)
The patient has experienced significant improvement since the last time I saw her. She returns at the end of the month to follow with radiation oncologist with repeat CT scan of the neck and chest. I will try to coordinate appointment to see her back as well to review test results. If the lung nodule is confined to a small area, I would recommend SBRT rather than systemic chemotherapy.

## 2016-01-27 NOTE — Assessment & Plan Note (Signed)
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 The patient denies recent history of bleeding such as epistaxis, hematuria or hematochezia. She is asymptomatic from the anemia. We will observe for now.  She does not require transfusion now.

## 2016-01-28 ENCOUNTER — Telehealth: Payer: Self-pay | Admitting: *Deleted

## 2016-01-28 NOTE — Telephone Encounter (Addendum)
  Oncology Nurse Navigator Documentation  Navigator Location: [facility] (01/28/16 1540) Navigator Encounter Type: Telephone (01/28/16 1540) Telephone: Debra Farrell Call (01/28/16 1540)             Barriers/Navigation Needs: Coordination of Care (01/28/16 1540)       In follow-up to conversation with RN Debra Farrell, I called Debra Farrell to check on the status of her PEG feeding including supply of Osmolite 1.5.  She noted that she has not used the feeding pump for a couple of weeks, has returned it to [name].  She indicated she is instilling 6/cans of supplement daily and is not experiencing N&V.  She noted she has 3 cases of supplement, does not need additional at this time. I provided Debra Farrell a verbal update, routed note to Debra Farrell, Debra Farrell.  Debra Farrell confirmed her understanding of appt with Debra Farrell on 5/15 at 2:00 PM.  Debra Orem, RN, BSN, [place] [facility] [place] [phone]                         Time Spent with Patient: 15 (01/28/16 1540)

## 2016-01-28 NOTE — Telephone Encounter (Signed)
  Oncology Nurse Navigator Documentation  Navigator Location: [facility] (01/28/16 1540) Navigator Encounter Type: Telephone (01/28/16 1540) Telephone: Outgoing Call (01/28/16 1420)                 Interventions: Coordination of Care (01/28/16 1420)       LVM informing Ms. Goodnow of 5/15 2:00 PM at [facility] to see Almyra Deforest, SLP.  Noted an appt to see Raford Pitcher will be coordinated with this appt.  I asked that she return my call to confirm receipt of message.  Gayleen Orem, RN, BSN, [place] [facility] [place] [phone]                   Time Spent with Patient: 15 (01/28/16 1420)

## 2016-02-01 ENCOUNTER — Ambulatory Visit: Payer: 59 | Admitting: Nutrition

## 2016-02-01 DIAGNOSIS — C01 Malignant neoplasm of base of tongue: Secondary | ICD-10-CM

## 2016-02-01 MED ORDER — OSMOLITE 1.5 CAL PO LIQD: ORAL | Status: DC

## 2016-02-01 MED FILL — DOXEPIN 25 MG CAPSULE: 25 | 7 days supply | Qty: 30 | Fill #2

## 2016-02-01 NOTE — Progress Notes (Signed)
Patient stopped by my office today. She reports she returned continuous feeding pump because she no longer needed it. She is now tolerating bolus feedings. She reports she needs a new order for Osmolite 1.5 bolus feedings. Weight has decreased and was documented as 143 pounds May 3, down from 148.7 pounds April 19 Patient has tolerated 6 cans Osmolite 1.5 daily with 45 mL ProMod once daily. Patient denies tube feeding intolerance. Patient expresses concern regarding painful swallowing.    Nutrition diagnosis:  Inadequate oral intake continues.   Diagnosis of inadequate enteral nutrition infusion related to weight loss and fatigue as evidenced by 5 pound weight loss in 3 weeks.  Revised estimated nutrition needs: 2275-2600 calories, 105-120 grams protein, 2.6 L fluid.  Intervention: Change Osmolite 1.5 to bolus feedings. Give one can Osmolite 1.5 once daily and 2 cans Osmolite 1.5 - 3 times a day with 120 cc free water before and after each bolus feeding. Continue 45 mL ProMod daily. Give an additional 500 cc free water flushes daily via tube or by mouth. Teach back method used. New orders were written.  Osmolite 1.5+ free water now provides 2635 cal, 119 g protein, 2727 mL free water.  Monitoring, evaluation, goals: Patient will tolerate increase in tube feeding to minimize further weight loss.  Next visit: To be scheduled.  **Disclaimer: This note was dictated with voice recognition software. Similar sounding words can inadvertently be transcribed and this note may contain transcription errors which may not have been corrected upon publication of note.**

## 2016-02-02 DIAGNOSIS — C01 Malignant neoplasm of base of tongue: Secondary | ICD-10-CM | POA: Diagnosis not present

## 2016-02-02 DIAGNOSIS — E43 Unspecified severe protein-calorie malnutrition: Secondary | ICD-10-CM | POA: Diagnosis not present

## 2016-02-08 ENCOUNTER — Ambulatory Visit: Payer: 59

## 2016-02-08 ENCOUNTER — Ambulatory Visit: Payer: 59 | Attending: Radiation Oncology

## 2016-02-08 DIAGNOSIS — R1310 Dysphagia, unspecified: Secondary | ICD-10-CM | POA: Insufficient documentation

## 2016-02-08 NOTE — Patient Instructions (Signed)
Half to full teaspoons of broth and other warm to hot liquids may be good for you to try, 1/2 cup, 2-3 times a day. Alternate between exercises in which you swallow and do not swallow to be able to complete all of them more frequently.

## 2016-02-08 NOTE — Therapy (Signed)
maintenance examination 10/17/2011  . Thalassemia 10/17/2011  . Essential hypertension 10/17/2011  . Hyperlipidemia 10/17/2011  . Headache(784.0) 10/17/2011    SCHINKE,CARL ,[place], Corinne  02/08/2016, 4:45 PM  [facility] [address] [place], [place], [address] Phone: [phone]   Fax:  [phone]   Name: Debra Farrell MRN: [id number] Date of Birth: 1946-03-22  maintenance examination 10/17/2011  . Thalassemia 10/17/2011  . Essential hypertension 10/17/2011  . Hyperlipidemia 10/17/2011  . Headache(784.0) 10/17/2011    SCHINKE,CARL ,[place], Corinne  02/08/2016, 4:45 PM  [facility] [address] [place], [place], [address] Phone: [phone]   Fax:  [phone]   Name: Debra Farrell MRN: [id number] Date of Birth: 1946-03-22  [facility] [address] [place], [place], [address] Phone: [phone]   Fax:  [phone]  Speech Language Pathology Treatment  Patient Details  Name: Debra Farrell MRN: [id number] Date of Birth: 12-30-1945 Referring Provider: [name] MD  Encounter Date: 02/08/2016      End of Session - 02/08/16 1639    Visit Number 3   Number of Visits 6   Date for SLP Re-Evaluation 05/23/16   SLP Start Time 59   SLP Stop Time  4081   SLP Time Calculation (min) 37 min   Activity Tolerance Patient tolerated treatment well      Past Medical History  Diagnosis Date  . Hypertension   . Hyperlipidemia   . Eczema   . Thalassemia minor   . Cancer of base of tongue ([place]) 10/14/2015  . Laceration 04/2014    around R ear, for falling out of bed & hitting nightstand  . Intractable nausea and vomiting 11/13/2015  . Abdominal abscess ([place]) 11/30/2015  . Infection with methicillin-resistant Staphylococcus aureus (MRSA) 11/30/2015  . Nausea without vomiting 12/16/2015  . Anemia in chronic illness 12/16/2015  . Throat pain in adult 12/29/2015    Past Surgical History  Procedure Laterality Date  . Abdominal hysterectomy  1990    partial  . Incontinence surgery  1990, [id number]  . Rectocele repair    . Urocele      correction surgery  . Tonsillectomy      as a child  . Direct laryngoscopy N/A 10/28/2015    Procedure: DIRECT LARYNGOSCOPY WITH BIOPSY;  Surgeon: [name], MD;  Location: [facility];  Service: ENT;  Laterality: N/A;  . Esophagoscopy N/A 10/28/2015    Procedure: ESOPHAGOSCOPY;  Surgeon: [name], MD;  Location: [facility];  Service: ENT;  Laterality: N/A;  . Laryngoscopy and bronchoscopy N/A 10/28/2015    Procedure: BRONCHOSCOPY;  Surgeon: [name], MD;  Location: [facility];  Service: ENT;  Laterality: N/A;  . Multiple extractions with alveoloplasty N/A 10/28/2015    Procedure: Extraction of tooth #'s 2-12, 14,15,17,18,20-29,  31 with alveoloplasy and mandibular left torus reduction;  Surgeon: Lenn Cal, DDS;  Location: [facility];  Service: Oral Surgery;  Laterality: N/A;    There were no vitals filed for this visit.      Subjective Assessment - 02/08/16 1425    Subjective Painful swallowing and "won't go past the end of my mouth" with liquids. Does not try solids. Xerostomia is also reported.               ADULT SLP TREATMENT - 02/08/16 1427    General Information   Behavior/Cognition Alert;Cooperative;Pleasant mood   Treatment Provided   Treatment provided Dysphagia   Dysphagia Treatment   Temperature Spikes Noted No   Respiratory Status Room air   Treatment Methods Therapeutic exercise;Patient/caregiver education   Type of PO's observed Thin liquids;Ice chips   Feeding Able to feed self   Pharyngeal Phase Signs & Symptoms Immediate cough  cough with ice chips   Other treatment/comments Pt cannot do fist push/chin resist, Masako, effortful swallow, Mendelsohn all difficult due to dry heaves after 2-3 reps. Pt with SBA needed for HEP. SLP told pt to try 1-2 reps and then a non-swallow exercise, then return to 1-2 reps of swallowing exercise. With < teaspoons thin liquids (chicken broth) pt without overt s/s aspiration. When sipped, immediate cough. SLP told pt to cont with < 1 teaspoon boluses of 1/2 cup

## 2016-02-09 ENCOUNTER — Ambulatory Visit (HOSPITAL_COMMUNITY): Payer: Self-pay | Admitting: Dentistry

## 2016-02-09 DIAGNOSIS — E43 Unspecified severe protein-calorie malnutrition: Secondary | ICD-10-CM | POA: Diagnosis not present

## 2016-02-09 DIAGNOSIS — C01 Malignant neoplasm of base of tongue: Secondary | ICD-10-CM | POA: Diagnosis not present

## 2016-02-11 NOTE — Progress Notes (Signed)
Debra Farrell presents for follow up of radiation completed 01/05/2016 to her Base of Tongue and bilateral neck.    Pain issues, if any: She denies pain at this time. She is using a 12.5 mcg Fentanyl patch, and using dilaudid '4mg'$  once daily for pain in her throat.  Using a feeding tube?: yes, she is instillling 7 cans of Osmolite daily with free water before and after.  Weight changes, if any:  Wt Readings from Last 3 Encounters:  02/17/16 143 lb 4.8 oz (65 kg)  02/16/16 143 lb (64.864 kg)  01/27/16 143 lb 6.4 oz (65.046 kg)    Swallowing issues, if any: She is only sipping water, and if she sips too much she will cough. She had a swallow evaluation with Glendell Docker last week and she could only tolerate 1 teaspoon of liquid before she began to cough.  Smoking or chewing tobacco? No Using fluoride trays daily? No Last ENT visit was on: No Other notable issues, if any:  She has a dry mouth, and is using biotine to keep her mouth moist.  She is not sleeping at night, and unable to nap during the day. This is a stressor for her.   CT Neck, Chest 02/16/16  BP 102/82 mmHg  Pulse 96  Temp(Src) 98.7 F (37.1 C)  Ht '5\' 6"'$  (1.676 m)  Wt 143 lb 4.8 oz (65 kg)  BMI 23.14 kg/m2  SpO2 99%

## 2016-02-16 ENCOUNTER — Ambulatory Visit (HOSPITAL_COMMUNITY)
Admission: RE | Admit: 2016-02-16 | Discharge: 2016-02-16 | Disposition: A | Discharge status: Home / Self Care | Payer: 59 | Source: Ambulatory Visit | Attending: Radiation Oncology | Admitting: Radiation Oncology

## 2016-02-16 ENCOUNTER — Ambulatory Visit (HOSPITAL_COMMUNITY): Payer: Self-pay | Admitting: Dentistry

## 2016-02-16 ENCOUNTER — Encounter (HOSPITAL_COMMUNITY): Payer: Self-pay | Admitting: Dentistry

## 2016-02-16 ENCOUNTER — Encounter (HOSPITAL_COMMUNITY): Payer: Self-pay

## 2016-02-16 VITALS — BP 134/60 | HR 86 | Temp 98.2°F | Wt 143.0 lb

## 2016-02-16 DIAGNOSIS — K0820 Unspecified atrophy of edentulous alveolar ridge: Secondary | ICD-10-CM

## 2016-02-16 DIAGNOSIS — L040 Acute lymphadenitis of face, head and neck: Secondary | ICD-10-CM | POA: Insufficient documentation

## 2016-02-16 DIAGNOSIS — I700 Atherosclerosis of aorta: Secondary | ICD-10-CM | POA: Diagnosis not present

## 2016-02-16 DIAGNOSIS — Z0189 Encounter for other specified special examinations: Secondary | ICD-10-CM

## 2016-02-16 DIAGNOSIS — R911 Solitary pulmonary nodule: Secondary | ICD-10-CM | POA: Diagnosis not present

## 2016-02-16 DIAGNOSIS — C01 Malignant neoplasm of base of tongue: Secondary | ICD-10-CM | POA: Insufficient documentation

## 2016-02-16 DIAGNOSIS — R590 Localized enlarged lymph nodes: Secondary | ICD-10-CM | POA: Diagnosis not present

## 2016-02-16 DIAGNOSIS — R432 Parageusia: Secondary | ICD-10-CM

## 2016-02-16 DIAGNOSIS — Z5189 Encounter for other specified aftercare: Secondary | ICD-10-CM | POA: Diagnosis not present

## 2016-02-16 DIAGNOSIS — K08109 Complete loss of teeth, unspecified cause, unspecified class: Secondary | ICD-10-CM

## 2016-02-16 DIAGNOSIS — K117 Disturbances of salivary secretion: Secondary | ICD-10-CM

## 2016-02-16 DIAGNOSIS — R682 Dry mouth, unspecified: Secondary | ICD-10-CM

## 2016-02-16 DIAGNOSIS — R1310 Dysphagia, unspecified: Secondary | ICD-10-CM

## 2016-02-16 DIAGNOSIS — Z9221 Personal history of antineoplastic chemotherapy: Secondary | ICD-10-CM

## 2016-02-16 DIAGNOSIS — Z923 Personal history of irradiation: Secondary | ICD-10-CM

## 2016-02-16 MED ORDER — IOPAMIDOL (ISOVUE-300) INJECTION 61%
75.0000 mL | Freq: Once | INTRAVENOUS | Status: AC | PRN
Start: 2016-02-16 — End: 2016-02-16
  Administered 2016-02-16: 75 mL via INTRAVENOUS

## 2016-02-16 NOTE — Progress Notes (Signed)
02/16/2016  Patient Name:   Debra Farrell Date of Birth:   Jun 30, 1946 Medical Record Number: [id number]  BP 134/60 mmHg  Pulse 86  Temp(Src) 98.2 F (36.8 C) (Oral)  Wt 143 lb (64.864 kg)  Debra Farrell presents for oral examination after chemoradiation therapy. Patient has completed all radiation treatments from 11/11/15 thru 01/05/16. Patient had 1 dose of chemotherapy but had significant adverse reactions.  REVIEW OF CHIEF COMPLAINTS: DRY MOUTH: Yes HARD TO SWALLOW: Yes. Patient is just starting to attempt mouth feedings again. Patient primarily uses her feeding tube to maintain her weight.  HURT TO SWALLOW: Yes TASTE CHANGES: Minimal taste at this time. SORES IN MOUTH: No TRISMUS: No problems with trismus symptoms. WEIGHT: 143 pounds down from the original 158 pounds at start of treatment.  HOME OH REGIMEN:  BRUSHING: Patient is edentulous.  Patient instructed to brush tongue daily. FLOSSING: Not applicable RINSING: Using Biotene rinses as needed. FLUORIDE: Not applicable TRISMUS EXERCISES:  Maximum interincisal opening: 42 mm.   DENTAL EXAM:  Oral Hygiene:(PLAQUE): Edentulous LOCATION OF MUCOSITIS: None noted DESCRIPTION OF SALIVA: Decreased saliva ANY EXPOSED BONE: None noted OTHER WATCHED AREAS: Previous extraction sites DX: Xerostomia, Dysgeusia, Dysphagia, Odynophagia and Edentulous, and atrophy of the edentulous alveolar ridges.  PLAN/RECOMMENDATIONS: 1. Brush tongue daily.  2. Use trismus exercises as directed. 3. Use Biotene Rinse or salt water/baking soda rinses. 4. Multiple sips of water as needed. 5. Suggested follow-up with prosthodontist for fabrication of upper lower complete dentures to start no earlier than 04/05/2016. Patient agrees to be referred to Dr. Vernard Gambles for evaluation of upper and lower complete dentures with or without implants. Patient was given preoperative models of her teeth take to Dr. Hardin Negus to assistant in fabrication of upper  and lower complete dentures.   Lenn Cal, DDS

## 2016-02-16 NOTE — Patient Instructions (Addendum)
PLAN/RECOMMENDATIONS: 1. Brush tongue daily.  2. Use trismus exercises as directed. 3. Use Biotene Rinse or salt water/baking soda rinses. 4. Multiple sips of water as needed. 5. Suggested follow-up with prosthodontist for fabrication of upper lower complete dentures to start no earlier than 04/05/2016. Patient agrees to be referred to Dr. Vernard Gambles for evaluation of upper and lower complete dentures with or without implants. Patient was given preoperative models of her teeth take to Dr. Hardin Negus to assistant in fabrication of upper and lower complete dentures.   Lenn Cal, DDS

## 2016-02-17 ENCOUNTER — Ambulatory Visit
Admission: RE | Admit: 2016-02-17 | Discharge: 2016-02-17 | Disposition: A | Discharge status: Home / Self Care | Payer: 59 | Source: Ambulatory Visit | Attending: Radiation Oncology | Admitting: Radiation Oncology

## 2016-02-17 ENCOUNTER — Encounter: Payer: Self-pay | Admitting: Radiation Oncology

## 2016-02-17 VITALS — BP 102/82 | HR 96 | Temp 98.7°F | Ht 66.0 in | Wt 143.3 lb

## 2016-02-17 DIAGNOSIS — Z923 Personal history of irradiation: Secondary | ICD-10-CM | POA: Diagnosis not present

## 2016-02-17 DIAGNOSIS — R05 Cough: Secondary | ICD-10-CM | POA: Diagnosis not present

## 2016-02-17 DIAGNOSIS — C3432 Malignant neoplasm of lower lobe, left bronchus or lung: Secondary | ICD-10-CM | POA: Diagnosis not present

## 2016-02-17 DIAGNOSIS — Z885 Allergy status to narcotic agent status: Secondary | ICD-10-CM | POA: Insufficient documentation

## 2016-02-17 DIAGNOSIS — Z79899 Other long term (current) drug therapy: Secondary | ICD-10-CM | POA: Insufficient documentation

## 2016-02-17 DIAGNOSIS — R911 Solitary pulmonary nodule: Secondary | ICD-10-CM | POA: Diagnosis not present

## 2016-02-17 DIAGNOSIS — C01 Malignant neoplasm of base of tongue: Secondary | ICD-10-CM

## 2016-02-17 NOTE — Progress Notes (Signed)
Radiation Oncology         (336) [phone] ________________________________  Name: Debra Farrell MRN: [id number]  Date: 02/17/2016  DOB: [birth year]  Follow-Up Visit Note  CC: Debra Farrell  Debra Farrell  Diagnosis and Prior Radiotherapy:       ICD-9-CM ICD-10-CM   1. Malignant neoplasm of lower lobe of left lung (HCC) 162.5 C34.32      Narrative:  The patient returns today for routine follow-up.                     [name] presents for radiation completed 01/05/16 to her Base of Tongue.   CT of neck and chest this week revealed signs of persistent yet shrinking, responding disease in the neck and base of tongue, and a growing LLL nodule suspicious for solitary metastasis.  PET has done been done for restating, yet, however.    She is feeling better. Dr Bertis Ruddy is not recommending chemotherapy any time soon per her notes.   Pain issues, if any: She denies pain at this time. She is using a 12.5 mcg Fentanyl patch, and using dilaudid 4mg  once daily for pain in her throat.  Using a feeding tube?: yes, she is instillling 7 cans of Osmolite daily with free water before and after.  Weight changes, if any:  Wt Readings from Last 3 Encounters:  02/17/16 143 lb 4.8 oz (65 kg)  02/16/16 143 lb (64.864 kg)  01/27/16 143 lb 6.4 oz (65.046 kg)    Swallowing issues, if any: She is only sipping water, and if she sips too much she will cough. She had a swallow evaluation with Baldo Ash last week and she could only tolerate 1 teaspoon of liquid before she began to cough.  Smoking or chewing tobacco? No Using fluoride trays daily? No Last ENT visit was on: No Other notable issues, if any:  She has a dry mouth, and is using biotine to keep her mouth moist.  She is not sleeping at night, and unable to nap during the day. This is a stressor for her.      BP 102/82 mmHg  Pulse 96  Temp(Src) 98.7 F (37.1 C)  Ht 5\' 6"  (1.676 m)  Wt 143 lb 4.8 oz (65 kg)  BMI 23.14 kg/m2  SpO2  99%   ALLERGIES:  is allergic to morphine and related.  Meds: Current Outpatient Prescriptions  Medication Sig Dispense Refill  . Amino Acids-Protein Hydrolys (FEEDING SUPPLEMENT, PRO-STAT SUGAR FREE 64,) LIQD Place 30 mLs into feeding tube daily. 900 mL 0  . doxepin (SINEQUAN) 25 MG capsule Take 1 capsule (25 mg total) by mouth every 6 (six) hours. 30 capsule 2  . fentaNYL (DURAGESIC - DOSED MCG/HR) 12 MCG/HR Place 1 patch (12.5 mcg total) onto the skin every 3 (three) days. To be added to the other prescription for total 37 mcg 5 patch 0  . HYDROmorphone (DILAUDID) 4 MG tablet Take 1 tablet (4 mg total) by mouth every 4 (four) hours as needed for severe pain. 90 tablet 0  . Nutritional Supplements (FEEDING SUPPLEMENT, OSMOLITE 1.5 CAL,) LIQD Change Osmolite 1.5 or equivalent to bolus feedings of 7 cans daily. Give 2 cans TID and one can once daily. Give 120 cc free water before and after bolus feedings. Continue 45 mL Promod once daily. Give additional 500 cc free water daily via tube or by mouth as tolerated. 1659 mL 0  . ondansetron (ZOFRAN) 8 MG tablet TAKE 1 TABLET  BY MOUTH 2 TIMES DAILY AS NEEDED. START ON THE THIRD DAY AFTER CHEMOTHERAPY. 30 tablet 1  . fentaNYL (DURAGESIC - DOSED MCG/HR) 25 MCG/HR patch Place 1 patch (25 mcg total) onto the skin every 3 (three) days. To be added to the other prescription for total 37 mcg (Patient not taking: Reported on 02/17/2016) 5 patch 0  . Water For Irrigation, Sterile (FREE WATER) SOLN Place 100 mLs into feeding tube 4 (four) times daily.     No current facility-administered medications for this encounter.    Physical Findings: The patient is in no acute distress. Patient is alert and oriented. Wt Readings from Last 3 Encounters:  02/17/16 143 lb 4.8 oz (65 kg)  02/16/16 143 lb (64.864 kg)  01/27/16 143 lb 6.4 oz (65.046 kg)    height is 5\' 6"  (1.676 m) and weight is 143 lb 4.8 oz (65 kg). Her temperature is 98.7 F (37.1 C). Her blood  pressure is 102/82 and her pulse is 96. Her oxygen saturation is 99%. .  General: Alert and oriented, in no acute distress  Neck: Neck is notable for visible, palpable right upper neck mass; skin dry  Psychiatric: Judgment and insight are intact. Affect is appropriate.   Lab Findings: Lab Results  Component Value Date   WBC 8.2 01/27/2016   HGB 9.2* 01/27/2016   HCT 28.9* 01/27/2016   MCV 71.4* 01/27/2016   PLT 298 01/27/2016    Lab Results  Component Value Date   TSH 0.318* 12/31/2015    Radiographic Findings: Ct Soft Tissue Neck W Contrast  02/16/2016  CLINICAL DATA:  Restaging tongue base cancer. EXAM: CT NECK WITH CONTRAST TECHNIQUE: Multidetector CT imaging of the neck was performed using the standard protocol following the bolus administration of intravenous contrast. CONTRAST:  75mL ISOVUE-300 IOPAMIDOL (ISOVUE-300) INJECTION 61% COMPARISON:  None. FINDINGS: Pharynx and larynx: Treated tongue base cancer with volume loss at tumor epicenter. Along the left aspect of the tongue base is a mucosal and submucosal nodular area of focally avid enhancement measuring 12 mm, concerning for marginal residual. The rest of the pharynx and supraglottic larynx shadows treatment effects with avid mucosal enhancement and submucosal edema. Cavitary adenopathy in the right cervical chain persists, but is improved. There are 2 cavitary nodes in the mid cervical chain, the largest measuring at least 30 mm with ill-defined margins and no fat plane between the thickened cortex and sternocleidomastoid. No contralateral adenopathy is seen. Salivary glands: Treatment changes. No acute finding. Calcification in the left floor of mouth is chronic and likely vascular. No submandibular duct dilatation. Thyroid: Right-sided nodules have decreased in size compared to prior, the larger projecting inferiorly from the right lobe now measuring up to 29 mm. Vascular: Right IJ sheath extrinsic narrowing due to right  cervical adenopathy. No visible invasion. No arterial occlusion. Limited intracranial: No visible metastasis. Low-density in the left periatrial white matter is presumably chronic microvascular disease Visualized orbits: Negative Mastoids and visualized paranasal sinuses: Bilateral mastoid fluid levels. Clear nasopharynx. Skeleton: No acute or aggressive process seen in the neck. Upper chest: Reported separately IMPRESSION: 1. Residual necrotic adenopathy in the right cervical chain with two thick walled nodes and extracapsular disease. 2. Nodular enhancement in the tongue base left the midline, suspicious for residual primary tumor. Electronically Signed   By: Marnee Spring M.D.   On: 02/16/2016 13:45   Ct Chest W Contrast  02/16/2016  CLINICAL DATA:  Base of tongue cancer. EXAM: CT CHEST WITH CONTRAST TECHNIQUE: Multidetector  CT imaging of the chest was performed during intravenous contrast administration. CONTRAST:  75mL ISOVUE-300 IOPAMIDOL (ISOVUE-300) INJECTION 61% COMPARISON:  10/04/2015 and 12/08/2015 FINDINGS: Mediastinum: The heart size appears normal. No pericardial effusion identified. Nodule arising from the inferior pole of the left lobe of thyroid gland measures 2.7 cm, image 11 of series 2. Aortic atherosclerosis noted. The trachea appears patent and is midline. Normal appearance of the esophagus. No mediastinal or hilar adenopathy identified. Lungs/Pleura: No pleural fluid identified. Extensive biapical scar like densities are again noted along with interstitial thickening peer within the left lower lobe there is a nodule measuring 2 x 1.4 cm, image 117 of series 5. On 12/08/2015 this nodule measured 0.9 x 0.7 cm, image 9 of series 4. Stable right lower lobe perifissural nodule measuring 4 mm, image 84 of series 5. Upper Abdomen: Gastrostomy tube is identified. The visualized portions of the liver are normal. The adrenal glands are unremarkable. Stable nonspecific, solitary low density  structure within the spleen measuring 7 mm, image 93 of series 2. This likely reflects a benign abnormality peer Musculoskeletal: No aggressive lytic or sclerotic bone lesions identified. IMPRESSION: 1. Pulmonary nodule in the left lower lobe has increased in size from 12/08/2015. This is a new finding when compared with 10/13/2014. Findings are worrisome for progression of pulmonary metastasis. 2. Aortic atherosclerosis. Electronically Signed   By: Signa Kell M.D.   On: 02/16/2016 14:37    Impression/Plan:    1) Head and Neck Cancer Status: chest imaging highly concerning for solitary site of metastatic disease to lung. Disease in H+N is improving, needs to be monitored closely.  May need resection for local control in future.  We discussed multiple options, including biopsy, PET, etc.  She knows that further treatment is likely palliative but she could live for a while if she does well with limited metastatic disease.  After long discussion of multiple options she and I agree to proceed with empiric SBRT to the growing left lower lung nodule for local control and not sustain the risks of biopsying  It.  Risks (ie injury to spleen, lungs, chest wall)  and benefits discussed, consent signed.  Also, I will refer her to ENT [facility]) for examination and monitoring  I will add to tumor board list  Tentatively, plan for PET in July at soonest to allow more tx response  Refer to SW for financial/work/social and emotional needs.  I think she should consider stopping work permanently.  I recommend counseling and the livestrong program at the [facility] for physical and emotional health.   2) Nutritional Status:weight stable PEG tube: all by PEG.  3) Risk Factors: The patient has been educated about risk factors including alcohol and tobacco abuse; they understand that avoidance of alcohol and tobacco is important to prevent recurrences as well as other cancers  4) Swallowing: seeing Verdie Mosher  for dysphagia.  5) Dental: no active issues  6) Thyroid function: follow Lab Results  Component Value Date   TSH 0.318* 12/31/2015    7)  Follow-up in early June for SBRT lung tx planning. The patient was encouraged to call with any issues or questions before then.  _____________________________________   Lonie Peak, Farrell

## 2016-02-18 NOTE — Addendum Note (Signed)
Encounter addended by: Ernst Spell, RN on: 02/18/2016  9:24 AM<BR>     Documentation filed: Charges VN

## 2016-02-23 ENCOUNTER — Encounter: Payer: Self-pay | Admitting: Radiation Oncology

## 2016-02-23 NOTE — Progress Notes (Signed)
Paperwork Scientist, clinical (histocompatibility and immunogenetics)) received, given to nurse 5/30

## 2016-02-25 ENCOUNTER — Other Ambulatory Visit: Payer: Self-pay | Admitting: Hematology and Oncology

## 2016-02-25 ENCOUNTER — Encounter: Payer: Self-pay | Admitting: *Deleted

## 2016-03-02 ENCOUNTER — Encounter: Payer: Self-pay | Admitting: Nutrition

## 2016-03-02 ENCOUNTER — Ambulatory Visit
Admission: RE | Admit: 2016-03-02 | Discharge: 2016-03-02 | Disposition: A | Discharge status: Home / Self Care | Payer: 59 | Source: Ambulatory Visit | Attending: Radiation Oncology | Admitting: Radiation Oncology

## 2016-03-02 DIAGNOSIS — C3432 Malignant neoplasm of lower lobe, left bronchus or lung: Secondary | ICD-10-CM | POA: Insufficient documentation

## 2016-03-02 DIAGNOSIS — C01 Malignant neoplasm of base of tongue: Secondary | ICD-10-CM | POA: Diagnosis not present

## 2016-03-02 NOTE — Progress Notes (Signed)
Patient did not show up for scheduled nutrition appointment.

## 2016-03-04 ENCOUNTER — Encounter: Payer: Self-pay | Admitting: Radiation Oncology

## 2016-03-04 NOTE — Progress Notes (Signed)
Paperwork received from doctor 6/8, faxed to [facility] @ [phone], confirmation received, LVM to patient that copy would be mailed to her, 6/9

## 2016-03-04 NOTE — Progress Notes (Signed)
Radiation Oncology         (336) [phone] ________________________________  Name: ADIELLA LAFLASH MRN: [id number]  Date: 03/02/2016  DOB: May 26, 1946  4DCT COMPLEX SIMULATION / TREATMENT PLANNING NOTE / SPECIAL TREATMENT PROCEDURE  Outpatient    ICD-9-CM ICD-10-CM   1. Malignant neoplasm of lower lobe of left lung (HCC) 162.5 C34.32     The patient was positioned on the CT simulator in a complex treatment device custom fitted to their body: A body fix blue bag. The patient's head was in an Accuform.  The patient's arms were over their head. An abdominal compression device was NOT fitted to decrease the patient's intrathoracic movements, given her PEG tube.   RESPIRATORY MOTION MANAGEMENT SIMULATION  NARRATIVE:  In order to account for effect of respiratory motion on target structures and other organs in the planning and delivery of radiotherapy, this patient underwent respiratory motion management simulation.  To accomplish this, when the patient was brought to the CT simulation planning suite, 4D respiratory motion management CT images were obtained.  The CT images were loaded into the planning software.  Then, using a variety of tools including Cine, MIP, and standard views, the target volume and planning target volumes (PTV) were delineated.  Avoidance structures were contoured.  Treatment planning then occurred.  Dose volume histograms will be generated and reviewed for each of the requested structure.  I contoured the patient's ITV and increased ITV with tight margins for the PTV. I will prescribe 50 to 60 Gy in 5 fractions of 10-12 Gy per fraction every other day. I requested a DVH of the patient's lungs, chest wall , spleen, target volumes, esophagus, heart, spinal cord  for 3D conformal planning.  Cone beam CT scans will be performed prior to each fraction to allow close PTV margins and sparing of normal tissues from high doses.  SPECIAL TREATMENT PROCEDURE NOTE:   This constitutes a  special treatment procedure due to the ablative dose delivered and the technical nature of treatment.  This highly technical modality of treatment ensures that the ablative dose is centered on the patient's tumor while sparing normal tissues from excessive dose and risk of detrimental effects. -----------------------------------  Lonie Peak, MD

## 2016-03-07 DIAGNOSIS — C01 Malignant neoplasm of base of tongue: Secondary | ICD-10-CM | POA: Diagnosis not present

## 2016-03-07 DIAGNOSIS — C3432 Malignant neoplasm of lower lobe, left bronchus or lung: Secondary | ICD-10-CM | POA: Diagnosis not present

## 2016-03-10 MED FILL — ONDANSETRON HCL 8 MG TABLET: 8 | 15 days supply | Qty: 30 | Fill #1

## 2016-03-11 ENCOUNTER — Other Ambulatory Visit: Payer: Self-pay | Admitting: Hematology and Oncology

## 2016-03-11 MED ORDER — DOXEPIN HCL 25 MG PO CAPS
25.0000 mg | ORAL_CAPSULE | Freq: Four times a day (QID) | ORAL | Status: DC
Start: 2016-03-11 — End: 2016-06-13

## 2016-03-11 MED FILL — fentaNYL 12 MCG/HR PT72: 12 | 15 days supply | Qty: 5 | Fill #0

## 2016-03-11 MED FILL — DOXEPIN 25 MG CAPSULE: 25 | 7 days supply | Qty: 30 | Fill #0

## 2016-03-14 ENCOUNTER — Ambulatory Visit
Admission: RE | Admit: 2016-03-14 | Discharge: 2016-03-14 | Disposition: A | Discharge status: Home / Self Care | Payer: 59 | Source: Ambulatory Visit | Attending: Radiation Oncology | Admitting: Radiation Oncology

## 2016-03-14 ENCOUNTER — Encounter: Payer: Self-pay | Admitting: Radiation Oncology

## 2016-03-14 ENCOUNTER — Ambulatory Visit: Payer: 59 | Admitting: Radiation Oncology

## 2016-03-14 VITALS — BP 128/76 | HR 101 | Temp 98.7°F | Ht 66.0 in | Wt 144.2 lb

## 2016-03-14 DIAGNOSIS — C3432 Malignant neoplasm of lower lobe, left bronchus or lung: Secondary | ICD-10-CM

## 2016-03-14 NOTE — Progress Notes (Signed)
Paperwork (matrix) received 6/19, given to nurse 6/19

## 2016-03-14 NOTE — Progress Notes (Addendum)
[name] is here for her 1st fraction of radiation to her LLL. She denies pain at this time, but does report when she lays down her right side hurts where she was tatooed for radiation. She is instilling 7 cans of osmolite in her feeding tube daily. She is not taking anything orally except water. She denies shortness of breath. She reports a cough with her thick saliva. She has some fatigue, and rests at home.   BP 128/76 mmHg  Pulse 101  Temp(Src) 98.7 F (37.1 C)  Ht '5\' 6"'$  (1.676 m)  Wt 144 lb 3.2 oz (65.409 kg)  BMI 23.29 kg/m2  SpO2 100%   Wt Readings from Last 3 Encounters:  03/14/16 144 lb 3.2 oz (65.409 kg)  02/17/16 143 lb 4.8 oz (65 kg)  02/16/16 143 lb (64.864 kg)

## 2016-03-14 NOTE — Progress Notes (Signed)
   Weekly Management Note:  Outpatient    ICD-9-CM ICD-10-CM   1. Malignant neoplasm of lower lobe of left lung (HCC) 162.5 C34.32     Current Dose:  12 Gy to left lower lung Projected Dose: 60 Gy   Narrative:  The patient presents for routine under treatment assessment.  CBCT/MVCT images/Port film x-rays were reviewed.  The chart was checked. Doing well, tolerating SBRT to chest.   Weight stable.  Using PEG tube.  Fatigue.  Physical Findings:  height is '5\' 6"'$  (1.676 m) and weight is 144 lb 3.2 oz (65.409 kg). Her temperature is 98.7 F (37.1 C). Her blood pressure is 128/76 and her pulse is 101. Her oxygen saturation is 100%.   Wt Readings from Last 3 Encounters:  03/14/16 144 lb 3.2 oz (65.409 kg)  02/17/16 143 lb 4.8 oz (65 kg)  02/16/16 143 lb (64.864 kg)   NAD, ambulatory.  Impression:  The patient is tolerating radiotherapy.  Plan:  Continue radiotherapy as planned.    ________________________________   [name], M.D.

## 2016-03-14 NOTE — Addendum Note (Signed)
Encounter addended by: Ernst Spell, RN on: 03/14/2016 12:39 PM<BR>     Documentation filed: Notes Section

## 2016-03-16 ENCOUNTER — Telehealth: Payer: Self-pay | Admitting: *Deleted

## 2016-03-16 ENCOUNTER — Encounter: Payer: Self-pay | Admitting: Hematology and Oncology

## 2016-03-16 ENCOUNTER — Telehealth: Payer: Self-pay | Admitting: Hematology and Oncology

## 2016-03-16 ENCOUNTER — Ambulatory Visit
Admission: RE | Admit: 2016-03-16 | Discharge: 2016-03-16 | Disposition: A | Discharge status: Home / Self Care | Payer: 59 | Source: Ambulatory Visit | Attending: Radiation Oncology | Admitting: Radiation Oncology

## 2016-03-16 ENCOUNTER — Ambulatory Visit: Payer: 59 | Admitting: Radiation Oncology

## 2016-03-16 ENCOUNTER — Encounter: Payer: Self-pay | Admitting: *Deleted

## 2016-03-16 ENCOUNTER — Ambulatory Visit (HOSPITAL_BASED_OUTPATIENT_CLINIC_OR_DEPARTMENT_OTHER): Payer: 59 | Admitting: Hematology and Oncology

## 2016-03-16 VITALS — BP 136/60 | HR 81 | Temp 98.3°F | Resp 17 | Ht 66.0 in | Wt 144.4 lb

## 2016-03-16 DIAGNOSIS — C01 Malignant neoplasm of base of tongue: Secondary | ICD-10-CM

## 2016-03-16 DIAGNOSIS — R1310 Dysphagia, unspecified: Secondary | ICD-10-CM | POA: Diagnosis not present

## 2016-03-16 DIAGNOSIS — Z931 Gastrostomy status: Secondary | ICD-10-CM | POA: Insufficient documentation

## 2016-03-16 DIAGNOSIS — R070 Pain in throat: Secondary | ICD-10-CM | POA: Diagnosis not present

## 2016-03-16 DIAGNOSIS — C3432 Malignant neoplasm of lower lobe, left bronchus or lung: Secondary | ICD-10-CM | POA: Diagnosis not present

## 2016-03-16 DIAGNOSIS — D6489 Other specified anemias: Secondary | ICD-10-CM

## 2016-03-16 MED ORDER — HYDROMORPHONE HCL 4 MG PO TABS: 4.0000 mg | ORAL_TABLET | ORAL | Status: DC | PRN

## 2016-03-16 NOTE — Assessment & Plan Note (Signed)
She has chronic throat pain. She is attempting to wean herself off pain medicine. We discussed strategies to reduce pain medicine requirements. She will continue low-dose fentanyl patch for now and I encouraged her to take Dilaudid as needed for pain. I refilled her prescription today

## 2016-03-16 NOTE — Assessment & Plan Note (Signed)
She has no further complication related to feeding tube.

## 2016-03-16 NOTE — Progress Notes (Signed)
Debra Farrell OFFICE PROGRESS NOTE  Patient Care Team: [name], MD as PCP - General [name], RN as Oncology Nurse Navigator [name], MD as Consulting Physician (Hematology and Oncology) [name], MD as Attending Physician (Radiation Oncology) [name], RD as Dietitian (Nutrition)  SUMMARY OF ONCOLOGIC HISTORY: Oncology History   Cancer of base of tongue [facility])   Staging form: Lip and Oral Cavity, AJCC 7th Edition     Clinical stage from 10/14/2015: Stage IVA (T2, N2b, M0) - Signed by [name], MD on 10/19/2015       Cancer of base of tongue ([place])   09/29/2015 Pathology Results Accession: [id number] FNA right neck showed necrotic cells suspicious for squamous cell cancer   09/29/2015 Pathology Results Accession: [id number] Mouth biopsy showed pyogenic granuloma   10/08/2015 Imaging CT neck showed 1. 3 cm right tongue base mass consistent with malignancy.2. Necrotic right level II and III nodal metastases.3. Enlarged right thyroid lobe containing multiple nodules.   10/14/2015 Imaging PET scan showed 1. Hypermetabolic right tongue base mass with hypermetabolic right level II and III adenopathy. Small left level III lymph node appears mildly hypermetabolic as well.2. A few scattered tiny pulmonary nodules are too small for PETresolution   10/16/2015 Imaging [place] Head/Neck:  1) Multiple thyroid lesions bilaterally; dominant nodule in right lower pole measures 22 mm and meets fine-needle aspiration criteria. 2) Complex mass within the soft tissues of the right side of neck worrisome for metastatic malignancy.   10/26/2015 Pathology Results Accession [id number]:  Thyroid FNA - Consistent with benign follicular nodule.   10/28/2015 Surgery Multiple tooth extractions (26), right tongue base biopsy.   10/28/2015 Pathology Results Accession [id number]:  Tongue, right base -  Infiltrative SCC, p16 positive.  Actinomyces infection.   11/05/2015 Procedure Port-a-cath and PEG placed.    11/11/2015 - 01/05/2016 Radiation Therapy She received radiation   11/11/2015 - 11/12/2015 Chemotherapy She received high dose cisplatin. Treatment was discontinued permanently due to severe side-effects   11/13/2015 Adverse Reaction She presented with intractable nausea and vomiting   11/13/2015 - 11/30/2015 Hospital Admission She was hospitalized for intractable nausea, vomiting and developed acute renal failure, reversed with aggressive IV fluid hydration. Subsequently, she was found to have intra-abdominal infection with MRSA requiring placement of drainage tube and IV ABx   11/24/2015 Imaging CT scan showed large abdominal abscess   11/25/2015 Procedure She had placement of drain   12/08/2015 Procedure Intra-abdominal drain was removed   12/08/2015 Imaging CT abdomen showed resolution of abscess and a new left lower base lung nodules   12/31/2015 Imaging Enlarging left lower lobe pulmonary nodule. This could represent a hematogenous metastasis although the rate of enlargement is faster than typical.   02/16/2016 Imaging Pulmonary nodule in the left lower lobe has increased in size from 12/08/2015. This is a new finding when compared with 10/13/2014. Findings are worrisome for progression of pulmonary metastasis   03/14/2016 -  Radiation Therapy She has SBRT to lung lesion    INTERVAL HISTORY: Please see below for problem oriented charting. She is tolerating treatment well. She continues to have mild pain and persistent dysphagia. Denies recent fever or chills.  REVIEW OF SYSTEMS:   Constitutional: Denies fevers, chills or abnormal weight loss Eyes: Denies blurriness of vision Ears, nose, mouth, throat, and face: Denies mucositis or sore throat Respiratory: Denies cough, dyspnea or wheezes Cardiovascular: Denies palpitation, chest discomfort or lower extremity swelling Gastrointestinal:  Denies nausea, heartburn or change in  bowel habits Skin: Denies abnormal skin rashes Lymphatics: Denies new  lymphadenopathy or easy bruising Neurological:Denies numbness, tingling or new weaknesses Behavioral/Psych: Mood is stable, no new changes  All other systems were reviewed with the patient and are negative.  I have reviewed the past medical history, past surgical history, social history and family history with the patient and they are unchanged from previous note.  ALLERGIES:  is allergic to morphine and related.  MEDICATIONS:  Current Outpatient Prescriptions  Medication Sig Dispense Refill  . Amino Acids-Protein Hydrolys (FEEDING SUPPLEMENT, PRO-STAT SUGAR FREE 64,) LIQD Place 30 mLs into feeding tube daily. 900 mL 0  . doxepin (SINEQUAN) 25 MG capsule Take 1 capsule (25 mg total) by mouth every 6 (six) hours. 30 capsule 2  . fentaNYL (DURAGESIC - DOSED MCG/HR) 12 MCG/HR Place 1 patch (12.5 mcg total) onto the skin every 3 (three) days. To be added to the other prescription for total 37 mcg 5 patch 0  . HYDROmorphone (DILAUDID) 4 MG tablet Take 1 tablet (4 mg total) by mouth every 4 (four) hours as needed for severe pain. 90 tablet 0  . Nutritional Supplements (FEEDING SUPPLEMENT, OSMOLITE 1.5 CAL,) LIQD Change Osmolite 1.5 or equivalent to bolus feedings of 7 cans daily. Give 2 cans TID and one can once daily. Give 120 cc free water before and after bolus feedings. Continue 45 mL Promod once daily. Give additional 500 cc free water daily via tube or by mouth as tolerated. 1659 mL 0  . ondansetron (ZOFRAN) 8 MG tablet TAKE 1 TABLET BY MOUTH 2 TIMES DAILY AS NEEDED. START ON THE THIRD DAY AFTER CHEMOTHERAPY. 30 tablet 1  . Water For Irrigation, Sterile (FREE WATER) SOLN Place 100 mLs into feeding tube 4 (four) times daily.     No current facility-administered medications for this visit.    PHYSICAL EXAMINATION: ECOG PERFORMANCE STATUS: 0 - Asymptomatic  Filed Vitals:   03/16/16 1150  BP: 136/60  Pulse: 81  Temp: 98.3 F (36.8 C)  Resp: 17   Filed Weights   03/16/16 1150  Weight:  144 lb 6.4 oz (65.499 kg)    GENERAL:alert, no distress and comfortable SKIN: skin color, texture, turgor are normal, no rashes or significant lesions EYES: normal, Conjunctiva are pink and non-injected, sclera clear OROPHARYNX:no exudate, no erythema and lips, buccal mucosa, and tongue normal  NECK: supple, thyroid normal size, non-tender, without nodularity LYMPH:  no palpable lymphadenopathy in the cervical, axillary or inguinal LUNGS: clear to auscultation and percussion with normal breathing effort HEART: regular rate & rhythm and no murmurs and no lower extremity edema ABDOMEN:abdomen soft, non-tender and normal bowel sounds Musculoskeletal:no cyanosis of digits and no clubbing  NEURO: alert & oriented x 3 with fluent speech, no focal motor/sensory deficits  LABORATORY DATA:  I have reviewed the data as listed    Component Value Date/Time   NA 133* 01/27/2016 1014   NA 132* 01/05/2016 0445   K 4.0 01/27/2016 1014   K 3.4* 01/05/2016 0445   CL 94* 01/05/2016 0445   CO2 28 01/27/2016 1014   CO2 30 01/05/2016 0445   GLUCOSE 242* 01/27/2016 1014   GLUCOSE 172* 01/05/2016 0445   BUN 27.8* 01/27/2016 1014   BUN 13 01/05/2016 0445   CREATININE 1.1 01/27/2016 1014   CREATININE 0.76 01/05/2016 0445   CREATININE 1.04 04/07/2015 1536   CALCIUM 9.6 01/27/2016 1014   CALCIUM 8.8* 01/05/2016 0445   PROT 6.5 01/27/2016 1014   PROT 5.7* 12/31/2015 0130  ALBUMIN 3.3* 01/27/2016 1014   ALBUMIN 2.7* 12/31/2015 0130   AST 22 01/27/2016 1014   AST 27 12/31/2015 0130   ALT 17 01/27/2016 1014   ALT 31 12/31/2015 0130   ALKPHOS 69 01/27/2016 1014   ALKPHOS 73 12/31/2015 0130   BILITOT 0.50 01/27/2016 1014   BILITOT 1.2 12/31/2015 0130   GFRNONAA >60 01/05/2016 0445   GFRAA >60 01/05/2016 0445    No results found for: SPEP, UPEP  Lab Results  Component Value Date   WBC 8.2 01/27/2016   NEUTROABS 6.9* 01/27/2016   HGB 9.2* 01/27/2016   HCT 28.9* 01/27/2016   MCV 71.4*  01/27/2016   PLT 298 01/27/2016      Chemistry      Component Value Date/Time   NA 133* 01/27/2016 1014   NA 132* 01/05/2016 0445   K 4.0 01/27/2016 1014   K 3.4* 01/05/2016 0445   CL 94* 01/05/2016 0445   CO2 28 01/27/2016 1014   CO2 30 01/05/2016 0445   BUN 27.8* 01/27/2016 1014   BUN 13 01/05/2016 0445   CREATININE 1.1 01/27/2016 1014   CREATININE 0.76 01/05/2016 0445   CREATININE 1.04 04/07/2015 1536      Component Value Date/Time   CALCIUM 9.6 01/27/2016 1014   CALCIUM 8.8* 01/05/2016 0445   ALKPHOS 69 01/27/2016 1014   ALKPHOS 73 12/31/2015 0130   AST 22 01/27/2016 1014   AST 27 12/31/2015 0130   ALT 17 01/27/2016 1014   ALT 31 12/31/2015 0130   BILITOT 0.50 01/27/2016 1014   BILITOT 1.2 12/31/2015 0130       ASSESSMENT & PLAN:  Cancer of base of tongue ([place]) She appears to be tolerating radiation therapy well. Continue the same. I would defer to the radiation oncologist to order follow-up imaging study. I plan to see her back in 2 months to provide further supportive care.  Dysphagia Unfortunately, she developed significant dysphagia and is currently 100% dependent on feeding tube for nutritional needs. She appears to be tolerating nutritional supplement well and has not lost any weight. I recommend she continues close follow-up with speech and language therapist  S/P gastrostomy ([place]) She has no further complication related to feeding tube.  Throat pain in adult She has chronic throat pain. She is attempting to wean herself off pain medicine. We discussed strategies to reduce pain medicine requirements. She will continue low-dose fentanyl patch for now and I encouraged her to take Dilaudid as needed for pain. I refilled her prescription today   Orders Placed This Encounter  Procedures  . CBC with Differential/Platelet    Standing Status: Standing     Number of Occurrences: 2     Standing Expiration Date: 03/16/2017  . Hold Tube, Blood Bank     Standing Status: Future     Number of Occurrences:      Standing Expiration Date: 04/20/2017   All questions were answered. The patient knows to call the clinic with any problems, questions or concerns. No barriers to learning was detected. I spent 15 minutes counseling the patient face to face. The total time spent in the appointment was 20 minutes and more than 50% was on counseling and review of test results     [facility], [place], MD 03/16/2016 2:30 PM

## 2016-03-16 NOTE — Telephone Encounter (Signed)
left msg confirming add apt today

## 2016-03-16 NOTE — Assessment & Plan Note (Signed)
She appears to be tolerating radiation therapy well. Continue the same. I would defer to the radiation oncologist to order follow-up imaging study. I plan to see her back in 2 months to provide further supportive care.

## 2016-03-16 NOTE — Telephone Encounter (Signed)
Apt added today per pof

## 2016-03-16 NOTE — Assessment & Plan Note (Signed)
Unfortunately, she developed significant dysphagia and is currently 100% dependent on feeding tube for nutritional needs. She appears to be tolerating nutritional supplement well and has not lost any weight. I recommend she continues close follow-up with speech and language therapist

## 2016-03-16 NOTE — Telephone Encounter (Signed)
per pof to sch pt appt-MW sch trmt-gave pt copy of avs

## 2016-03-16 NOTE — Telephone Encounter (Signed)
  Oncology Nurse Navigator Documentation  Navigator Location: [facility] (03/16/16 6834) Navigator Encounter Type: Telephone (03/16/16 1962) Telephone: Jerilee Hoh Confirmation/Clarification (03/16/16 2297)                 Interventions: Coordination of Care (03/16/16 0928)       Called Ms. Ericsson to inform her that she will have a follow-up appt wit Dr. Alvy Bimler today after her radiation treatment.  She voiced understanding.  Gayleen Orem, RN, BSN, [place] [facility] [place] [phone]                   Time Spent with Patient: 15 (03/16/16 4081)

## 2016-03-16 NOTE — Telephone Encounter (Signed)
Per staff message and POF I have scheduled appts. Advised scheduler of appts. JMW  

## 2016-03-16 NOTE — Progress Notes (Signed)
[place] Work  Clinical Social Work made multiple attempts to reach patient by phone.  CSW contacted patient by phone today, she indicated she is in the process of liquidating her assets and hopes to get that settled by the end of this week.  CSW asked patient how she was handling side effects and coping with changes at home.  The patient reported she does not plan to go back to work and has applied for disability through [employer].  CSW encouraged patient to also apply for Social Security Disability and informed her she can refer to agency to help her apply.  Patient plans to contact CSW if she would like her assistance.  Polo Riley, MSW, LCSW, OSW-C Clinical Social Worker [facility] [phone]

## 2016-03-17 ENCOUNTER — Ambulatory Visit: Payer: Self-pay | Admitting: Hematology and Oncology

## 2016-03-18 ENCOUNTER — Ambulatory Visit: Payer: 59 | Admitting: Radiation Oncology

## 2016-03-18 ENCOUNTER — Ambulatory Visit (HOSPITAL_BASED_OUTPATIENT_CLINIC_OR_DEPARTMENT_OTHER): Payer: 59

## 2016-03-18 ENCOUNTER — Ambulatory Visit
Admission: RE | Admit: 2016-03-18 | Discharge: 2016-03-18 | Disposition: A | Discharge status: Home / Self Care | Payer: 59 | Source: Ambulatory Visit | Attending: Radiation Oncology | Admitting: Radiation Oncology

## 2016-03-18 ENCOUNTER — Ambulatory Visit: Payer: 59

## 2016-03-18 ENCOUNTER — Other Ambulatory Visit (HOSPITAL_BASED_OUTPATIENT_CLINIC_OR_DEPARTMENT_OTHER): Payer: 59

## 2016-03-18 VITALS — BP 152/75 | HR 82 | Temp 98.2°F | Resp 18

## 2016-03-18 DIAGNOSIS — C01 Malignant neoplasm of base of tongue: Secondary | ICD-10-CM

## 2016-03-18 DIAGNOSIS — C3432 Malignant neoplasm of lower lobe, left bronchus or lung: Secondary | ICD-10-CM | POA: Diagnosis not present

## 2016-03-18 DIAGNOSIS — IMO0002 Reserved for concepts with insufficient information to code with codable children: Secondary | ICD-10-CM

## 2016-03-18 DIAGNOSIS — A4902 Methicillin resistant Staphylococcus aureus infection, unspecified site: Secondary | ICD-10-CM

## 2016-03-18 DIAGNOSIS — D6489 Other specified anemias: Secondary | ICD-10-CM

## 2016-03-18 LAB — CBC WITH DIFFERENTIAL/PLATELET
BASO%: 0.6 % (ref 0.0–2.0)
BASOS ABS: 0 10*3/uL (ref 0.0–0.1)
EOS ABS: 0.2 10*3/uL (ref 0.0–0.5)
EOS%: 3.8 % (ref 0.0–7.0)
HEMATOCRIT: 27 % — AB (ref 34.8–46.6)
HEMOGLOBIN: 8.5 g/dL — AB (ref 11.6–15.9)
LYMPH%: 8.9 % — ABNORMAL LOW (ref 14.0–49.7)
MCH: 21.2 pg — ABNORMAL LOW (ref 25.1–34.0)
MCHC: 31.5 g/dL (ref 31.5–36.0)
MCV: 67.1 fL — AB (ref 79.5–101.0)
MONO#: 0.6 10*3/uL (ref 0.1–0.9)
MONO%: 11.6 % (ref 0.0–14.0)
NEUT%: 75.1 % (ref 38.4–76.8)
NEUTROS ABS: 3.8 10*3/uL (ref 1.5–6.5)
PLATELETS: 267 10*3/uL (ref 145–400)
RBC: 4.02 10*6/uL (ref 3.70–5.45)
RDW: 14.7 % — AB (ref 11.2–14.5)
WBC: 5.1 10*3/uL (ref 3.9–10.3)
lymph#: 0.5 10*3/uL — ABNORMAL LOW (ref 0.9–3.3)

## 2016-03-18 LAB — COMPREHENSIVE METABOLIC PANEL
ALT: 21 U/L (ref 0–55)
ANION GAP: 7 meq/L (ref 3–11)
AST: 22 U/L (ref 5–34)
Albumin: 3.6 g/dL (ref 3.5–5.0)
Alkaline Phosphatase: 74 U/L (ref 40–150)
BUN: 30.4 mg/dL — ABNORMAL HIGH (ref 7.0–26.0)
CHLORIDE: 100 meq/L (ref 98–109)
CO2: 29 meq/L (ref 22–29)
Calcium: 9.7 mg/dL (ref 8.4–10.4)
Creatinine: 0.9 mg/dL (ref 0.6–1.1)
EGFR: 62 mL/min/{1.73_m2} — AB (ref >90)
Glucose: 93 mg/dl (ref 70–140)
POTASSIUM: 4.4 meq/L (ref 3.5–5.1)
Sodium: 137 mEq/L (ref 136–145)
Total Bilirubin: 0.42 mg/dL (ref 0.20–1.20)
Total Protein: 6.8 g/dL (ref 6.4–8.3)

## 2016-03-18 LAB — MAGNESIUM: Magnesium: 2.2 mg/dl (ref 1.5–2.5)

## 2016-03-18 MED ORDER — SODIUM CHLORIDE 0.9 % IJ SOLN
10.0000 mL | INTRAMUSCULAR | Status: DC | PRN
  Administered 2016-03-18: 10 mL via INTRAVENOUS
  Filled 2016-03-18: qty 10

## 2016-03-18 MED ORDER — HEPARIN SOD (PORK) LOCK FLUSH 100 UNIT/ML IV SOLN
500.0000 [IU] | Freq: Once | INTRAVENOUS | Status: AC | PRN
  Administered 2016-03-18: 500 [IU] via INTRAVENOUS
  Filled 2016-03-18: qty 5

## 2016-03-18 NOTE — Patient Instructions (Signed)

## 2016-03-18 NOTE — Progress Notes (Signed)
   Weekly Management Note:  Outpatient    ICD-9-CM ICD-10-CM   1. Other fatigue 780.79 R53.83 TSH  2. Cancer of base of tongue (HCC) 141.0 C01 NM PET Image Restag (PS) Skull Base To Thigh     TSH  3. Malignant neoplasm of lower lobe of left lung (HCC) 162.5 C34.32     Current Dose:   48 Gy to left lower lung Projected Dose: 60 Gy   Narrative: The patient presents for routine under treatment assessment.  CBCT/MVCT images/Port film x-rays were reviewed.  The chart was checked. Doing well, tolerating SBRT to chest.   Weight stable.  Using PEG tube.  Fatigue continues. Denies skin irritation over lower back  Physical Findings:  height is '5\' 6"'$  (1.676 m) and weight is 143 lb 1.6 oz (64.91 kg). Her oral temperature is 98 F (36.7 C). Her blood pressure is 127/66 and her pulse is 97. Her respiration is 18 and oxygen saturation is 100%.   Wt Readings from Last 3 Encounters:  03/21/16 143 lb 1.6 oz (64.91 kg)  03/16/16 144 lb 6.4 oz (65.499 kg)  03/14/16 144 lb 3.2 oz (65.409 kg)   NAD, ambulatory. Diffuse dryness and hyperpigmentation over neck.  Palpable mass in level 2 of right neck    Impression:  The patient is tolerating radiotherapy.  Plan:  Continue radiotherapy as planned.  Vit E oil over neck at night, moisturizing sunblock recommended during the day. PET/ CT and TSH in mid July at f/u ordered.  ________________________________   [name], M.D.

## 2016-03-21 ENCOUNTER — Ambulatory Visit
Admission: RE | Admit: 2016-03-21 | Discharge: 2016-03-21 | Disposition: A | Discharge status: Home / Self Care | Payer: 59 | Source: Ambulatory Visit | Attending: Radiation Oncology | Admitting: Radiation Oncology

## 2016-03-21 ENCOUNTER — Encounter: Payer: Self-pay | Admitting: Radiation Oncology

## 2016-03-21 ENCOUNTER — Telehealth: Payer: Self-pay | Admitting: *Deleted

## 2016-03-21 VITALS — BP 127/66 | HR 97 | Temp 98.0°F | Resp 18 | Ht 66.0 in | Wt 143.1 lb

## 2016-03-21 DIAGNOSIS — R5383 Other fatigue: Secondary | ICD-10-CM

## 2016-03-21 DIAGNOSIS — C3432 Malignant neoplasm of lower lobe, left bronchus or lung: Secondary | ICD-10-CM | POA: Diagnosis not present

## 2016-03-21 DIAGNOSIS — C01 Malignant neoplasm of base of tongue: Secondary | ICD-10-CM

## 2016-03-21 LAB — TSH: TSH: 1.425 m[IU]/L (ref 0.308–3.960)

## 2016-03-21 NOTE — Progress Notes (Addendum)
Debra Farrell is here for her 4 fraction of radiation to her LLL. Skin to chest without change normal color, upper neck color is from previous radiation treatment.  Using Sonafine bid.  She denies pain at this time. She is instilling 7 cans of osmolite in her feeding tube daily. She is not taking anything orally except water 8 oz. Qid.. She denies shortness of breath. She reports a cough with clear phlegm. She has some fatigue, and rests at home. Wt Readings from Last 3 Encounters:  03/21/16 143 lb 1.6 oz (64.91 kg)  03/16/16 144 lb 6.4 oz (65.499 kg)  03/14/16 144 lb 3.2 oz (65.409 kg)  BP 127/66 mmHg  Pulse 97  Temp(Src) 98 F (36.7 C) (Oral)  Resp 18  Ht '5\' 6"'$  (1.676 m)  Wt 143 lb 1.6 oz (64.91 kg)  BMI 23.11 kg/m2  SpO2 100%

## 2016-03-21 NOTE — Telephone Encounter (Signed)
-----   Message from [name], MD sent at 03/21/2016  7:50 AM EDT ----- Regarding: labs Pls let her know labs from Friday was OK. Chronic anemia, stable

## 2016-03-21 NOTE — Telephone Encounter (Signed)
LVM for pt informing of labs ok,  Anemia stable per Dr. Alvy Bimler.  Instructed her to call nurse back if any questions.

## 2016-03-23 ENCOUNTER — Encounter: Payer: Self-pay | Admitting: Radiation Oncology

## 2016-03-23 ENCOUNTER — Ambulatory Visit
Admission: RE | Admit: 2016-03-23 | Discharge: 2016-03-23 | Disposition: A | Discharge status: Home / Self Care | Payer: 59 | Source: Ambulatory Visit | Attending: Radiation Oncology | Admitting: Radiation Oncology

## 2016-03-23 DIAGNOSIS — C01 Malignant neoplasm of base of tongue: Secondary | ICD-10-CM | POA: Diagnosis not present

## 2016-03-23 DIAGNOSIS — E43 Unspecified severe protein-calorie malnutrition: Secondary | ICD-10-CM | POA: Diagnosis not present

## 2016-03-23 DIAGNOSIS — C3432 Malignant neoplasm of lower lobe, left bronchus or lung: Secondary | ICD-10-CM | POA: Diagnosis not present

## 2016-03-31 NOTE — Progress Notes (Signed)
Ms. Simonich presents for follow up of radiation received to her base of Tongue completed 01/05/16 and Left Lower Lung completed 03/23/16.  Pain issues, if any: She denies pain at this time.  Using a feeding tube? Yes, she is instilling 7 cans of osmolite daily, and a protein supplement daily. She is also instilling water into her feeding tube.  Weight changes, if any:  Wt Readings from Last 3 Encounters:  04/06/16 145 lb 8 oz (65.998 kg)  03/21/16 143 lb 1.6 oz (64.91 kg)  03/16/16 144 lb 6.4 oz (65.499 kg)   Swallowing issues, if any: She is only drinking water and thin soups orally. She plans to talk with Harvel Quale after her results today about starting swallowing exercises.  Smoking or chewing tobacco? No Using fluoride trays daily? She has no teeth.  Last ENT visit was on: None recently.  Other notable issues, if any: She reports a dry mouth, which makes it difficult to swallow solid foods.   She reports feeling tired all of the time, and is most active in the morning. She has nausea in the afternoon after her 4th can of osmolite, and will take a zofran to help with the nausea. She swallowed water in the office today, and I noted some coughing, she reports this happens if she "doesn't swallow correctly".   She denies a chronic cough, or shortness of breath at this time.    BP 128/65 mmHg  Pulse 100  Temp(Src) 98.3 F (36.8 C)  Ht '5\' 6"'$  (1.676 m)  Wt 145 lb 8 oz (65.998 kg)  BMI 23.50 kg/m2  SpO2 99%

## 2016-04-04 ENCOUNTER — Encounter: Payer: Self-pay | Admitting: Radiation Oncology

## 2016-04-04 NOTE — Progress Notes (Signed)
Faxed FMLA/disability information and records to [employer] at [phone].

## 2016-04-05 ENCOUNTER — Ambulatory Visit (HOSPITAL_COMMUNITY)
Admission: RE | Admit: 2016-04-05 | Discharge: 2016-04-05 | Disposition: A | Discharge status: Home / Self Care | Payer: 59 | Source: Ambulatory Visit | Attending: Radiation Oncology | Admitting: Radiation Oncology

## 2016-04-05 DIAGNOSIS — R918 Other nonspecific abnormal finding of lung field: Secondary | ICD-10-CM | POA: Diagnosis not present

## 2016-04-05 DIAGNOSIS — R911 Solitary pulmonary nodule: Secondary | ICD-10-CM | POA: Diagnosis not present

## 2016-04-05 DIAGNOSIS — C01 Malignant neoplasm of base of tongue: Secondary | ICD-10-CM | POA: Insufficient documentation

## 2016-04-05 LAB — GLUCOSE, CAPILLARY: GLUCOSE-CAPILLARY: 114 mg/dL — AB (ref 65–99)

## 2016-04-05 MED ORDER — FLUDEOXYGLUCOSE F - 18 (FDG) INJECTION
7.1300 | Freq: Once | INTRAVENOUS | Status: AC | PRN
  Administered 2016-04-05: 7.13 via INTRAVENOUS

## 2016-04-05 NOTE — Progress Notes (Signed)
Radiation Oncology         [phone] ________________________________  Name: Debra Farrell MRN: [id number]  Date: 04/06/2016  DOB: 1946-07-15  Follow-Up Visit Note  CC: Debra Ran, MD  Debra Shanks, MD  Diagnosis and Prior Radiotherapy:       ICD-9-CM ICD-10-CM   1. 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 Malignant neoplasm of lower lobe of left lung (HCC) 162.5 C34.32          Left lower lung / 60 Gy in 5 fractions SBRT ; 03/14/2016-03/23/2016  Base of tongue and bilateral neck / 70 Gy in 35 fractions to gross disease, 63 Gy in 35 fractions to high risk nodal echelons, and 56 Gy in 35 fractions to intermediate risk nodal echelons ; 11/11/2015-01/05/2016  Narrative:  The patient returns today for routine follow-up.  Debra Farrell presents for follow up of radiation received to her base of Tongue completed 01/05/16 and Left Lower Lung completed 03/23/16.  Pain issues, if any: She denies pain at this time.  Using a feeding tube? Yes, she is instilling 7 cans of osmolite daily, and a protein supplement daily. She is also instilling water into her feeding tube.  Weight changes, if any:  Wt Readings from Last 3 Encounters:  04/06/16 145 lb 8 oz (65.998 kg)  03/21/16 143 lb 1.6 oz (64.91 kg)  03/16/16 144 lb 6.4 oz (65.499 kg)   Swallowing issues, if any: She is only drinking water and thin soups orally. She plans to talk with Debra Farrell after her results today about starting swallowing exercises.  Smoking or chewing tobacco? No Using fluoride trays daily? She has no teeth.  Last ENT visit was on: None recently.  Other notable issues, if any: She reports a dry mouth, which makes it difficult to swallow solid foods.   She reports feeling tired all of the time, and is most active in the morning. She has nausea in the afternoon after her 4th can  of osmolite, and will take a zofran to help with the nausea. She swallowed water in the office today, and I noted some coughing, she reports this happens if she "doesn't swallow correctly".   She denies a chronic cough, or shortness of breath at this time.         ALLERGIES:  is allergic to morphine and related.  Meds: Current Outpatient Prescriptions  Medication Sig Dispense Refill  . Amino Acids-Protein Hydrolys (FEEDING SUPPLEMENT, PRO-STAT SUGAR FREE 64,) LIQD Place 30 mLs into feeding tube daily. 900 mL 0  . doxepin (SINEQUAN) 25 MG capsule Take 1 capsule (25 mg total) by mouth every 6 (six) hours. 30 capsule 2  . Nutritional Supplements (FEEDING SUPPLEMENT, OSMOLITE 1.5 CAL,) LIQD Change Osmolite 1.5 or equivalent to bolus feedings of 7 cans daily. Give 2 cans TID and one can once daily. Give 120 cc free water before and after bolus feedings. Continue 45 mL Promod once daily. Give additional 500 cc free water daily via tube or by mouth as tolerated. 1659 mL 0  . ondansetron (ZOFRAN) 8 MG tablet TAKE 1 TABLET BY MOUTH 2 TIMES DAILY AS NEEDED. START ON THE THIRD DAY AFTER CHEMOTHERAPY. 30 tablet 1  . Water For Irrigation, Sterile (FREE WATER) SOLN Place 100 mLs into feeding tube 4 (four) times  daily.    . fentaNYL (DURAGESIC - DOSED MCG/HR) 12 MCG/HR Place 1 patch (12.5 mcg total) onto the skin every 3 (three) days. To be added to the other prescription for total 37 mcg 10 patch 0  . fluconazole (DIFLUCAN) 100 MG tablet Take 2 tablets today, then 1 tablet daily x 20 more days. 22 tablet 0  . HYDROmorphone (DILAUDID) 4 MG tablet Take 1 tablet (4 mg total) by mouth every 4 (four) hours as needed for severe pain. 90 tablet 0   No current facility-administered medications for this encounter.    Physical Findings: The patient is in no acute distress. Patient is alert and oriented. Wt Readings from Last 3 Encounters:  04/06/16 145 lb 8 oz (65.998 kg)  03/21/16 143 lb 1.6 oz (64.91 kg)    03/16/16 144 lb 6.4 oz (65.499 kg)    height is 5\' 6"  (1.676 m) and weight is 145 lb 8 oz (65.998 kg). Her temperature is 98.3 F (36.8 C). Her blood pressure is 128/65 and her pulse is 100. Her oxygen saturation is 99%. .  General: Alert and oriented, in no acute distress HEENT: Head is normocephalic. Extraocular movements are intact. Oropharynx is notable for possible thrush on tongue which is coated white Neck: Neck is notable for palpable flattening level II right neck mass Skin: Skin in treatment fields shows satisfactory healing with hyperpigmentation and dryness, improving Heart: Regular in rate and rhythm with no murmurs, rubs, or gallops. Chest: Clear to auscultation bilaterally, with no rhonchi, wheezes, or rales. Abdomen: PEG stoma Extremities: No cyanosis or edema. Lymphatics: see Neck Exam Psychiatric: Judgment and insight are intact. Affect is appropriate.   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 Base of tongue carcinoma. EXAM: NUCLEAR MEDICINE PET SKULL BASE TO THIGH TECHNIQUE: 7.3 mCi F-18 FDG was injected intravenously. Full-ring PET imaging was performed from the skull base to thigh after the radiotracer. CT data was obtained and used for attenuation correction and anatomic localization. FASTING BLOOD GLUCOSE:  Value: 114 mg/dl COMPARISON:  PET-CT 29/52/8413 FINDINGS: NECK The enlarged LEFT level 2 lymph node wiht central necrosis on the RIGHT side measures 16 mm short axis (image 34, series 4) decreased from 27 mm. This lymph node has metabolic activity equal to surrounding muscle and vascular structures and markedly  reduced from comparison PET-CT scan. No new hypermetabolic lymph nodes in the neck. There is mild activity at the base the tongue without focality. Previously hypermetabolic mass. No focal metabolic activity in the hypopharynx. CHEST There is a new hypermetabolic nodule within the LEFT lower lobe measuring 2.1 cm with SUV max equal 7.6. There is bilateral hypermetabolic nodular thickening in the LEFT and RIGHT lung apex which has a LEFT defined morphology within the LEFT lower lobe nodule. For example angular nodule in the RIGHT upper lobe measuring 12 mm on image 9, series 8 with SUV max equal 3.6 and similar nodule in the LEFT upper lobe on image 9. ABDOMEN/PELVIS No abnormal hypermetabolic activity within the liver, pancreas, adrenal glands, or spleen. No hypermetabolic lymph nodes in the abdomen or pelvis.  Hypermetabolic activity associated with the PEG tube is favored inflammatory. SKELETON No focal hypermetabolic activity to suggest skeletal metastasis. IMPRESSION: 1. New hypermetabolic nodule in the LEFT lower lobe consistent with METASTATIC PULMONARY LESION. 2. Less well-defined and less metabolic angular nodules in LEFT and RIGHT upper lobe with differential including post therapy metabolic inflammation versus less likely metastatic pulmonary lesions. 3. Necrotic lymph node in the RIGHT level II nodal station is decreased in size and has metabolic activity similar to adjacent background tissue. No clear evidence of residual active metastatic disease. 4. No clear evidence of residual metabolic tumor at the tongue base with only mild metabolic activity in hypopharynx without focality. Electronically Signed   By: Genevive Bi M.D.   On: 04/05/2016 10:36    Impression/Plan:    1) Head and Neck Cancer Status: response to tx in neck, too soon to verify status of LLL nodule.  Present at tumor board.  2) Nutritional Status: stable PEG tube: still using  3) Risk Factors: The patient has been educated  about risk factors including alcohol and tobacco abuse; they understand that avoidance of alcohol and tobacco is important to prevent recurrences as well as other cancers; abstaining from tobacco  4) Swallowing:refer back to SLP for dysphagia  5) Dental: edentulous  6) Thyroid function: good Lab Results  Component Value Date   TSH 1.425 03/21/2016    7) Other: refer back to PT for deconditioning.  She may see them with SLP and nutrition  + SW at [facility] clinic.  Pt has Social work questions re: disability entitlements  Fluconazole for thrush.  8) Follow-up in 3 months with CT chest and neck.  Refer back to Dr Lazarus Salines.. The patient was encouraged to call with any issues or questions before then.     Lonie Peak, MD   This document serves as a record of services personally performed by Lonie Peak, MD. It was created on her behalf by Delana Meyer, a trained medical scribe. The creation of this record is based on the scribe's personal observations and the provider's statements to them. This document has been checked and approved by the attending provider.

## 2016-04-05 NOTE — Progress Notes (Signed)
Radiation Oncology         [phone] ________________________________  Name: Debra Farrell MRN: [id number]  Date: 03/23/2016  DOB: Sep 14, 1946  End of Treatment Note  Diagnosis:      ICD-9-CM ICD-10-CM   1. Malignant neoplasm of lower lobe of left lung (HCC) 162.5 C34.32        Indication for treatment: curative     Radiation treatment dates:   03/14/2016-03/23/2016  Site/dose:  Left lower lung / 60 Gy in 5 fractions  Beams/energy:   SBRT-3D / 6FFF  Narrative: The patient tolerated radiation treatment relatively well.     Plan: The patient has completed radiation treatment. The patient will return to radiation oncology clinic for routine followup in one half month. PET will be done to restage her neck disease. I advised them to call or return sooner if they have any questions or concerns related to their recovery or treatment.  -----------------------------------  Lonie Peak, MD

## 2016-04-06 ENCOUNTER — Other Ambulatory Visit: Payer: Self-pay | Admitting: Hematology and Oncology

## 2016-04-06 ENCOUNTER — Telehealth: Payer: Self-pay | Admitting: *Deleted

## 2016-04-06 ENCOUNTER — Encounter: Payer: Self-pay | Admitting: Radiation Oncology

## 2016-04-06 ENCOUNTER — Ambulatory Visit
Admission: RE | Admit: 2016-04-06 | Discharge: 2016-04-06 | Disposition: A | Discharge status: Home / Self Care | Payer: 59 | Source: Ambulatory Visit | Attending: Radiation Oncology | Admitting: Radiation Oncology

## 2016-04-06 VITALS — BP 128/65 | HR 100 | Temp 98.3°F | Ht 66.0 in | Wt 145.5 lb

## 2016-04-06 DIAGNOSIS — C01 Malignant neoplasm of base of tongue: Secondary | ICD-10-CM

## 2016-04-06 DIAGNOSIS — C3432 Malignant neoplasm of lower lobe, left bronchus or lung: Secondary | ICD-10-CM | POA: Diagnosis not present

## 2016-04-06 HISTORY — DX: Personal history of irradiation: Z92.3

## 2016-04-06 MED ORDER — FENTANYL 12 MCG/HR TD PT72: 12.0000 ug | MEDICATED_PATCH | TRANSDERMAL | Status: DC

## 2016-04-06 MED ORDER — FLUCONAZOLE 100 MG PO TABS: ORAL_TABLET | ORAL | Status: DC

## 2016-04-06 MED ORDER — HYDROMORPHONE HCL 4 MG PO TABS: 4.0000 mg | ORAL_TABLET | ORAL | Status: DC | PRN

## 2016-04-06 MED FILL — ONDANSETRON HCL 8 MG TABLET: 8 | 15 days supply | Qty: 30 | Fill #0 | Status: TO

## 2016-04-06 MED FILL — HYDROmorphone HCL 4 MG TABS: 4 | 15 days supply | Qty: 90 | Fill #0

## 2016-04-06 MED FILL — DOXEPIN 25 MG CAPSULE: 25 | 7 days supply | Qty: 30 | Fill #1

## 2016-04-06 NOTE — Telephone Encounter (Signed)
Pt requests refill on Fentanyl Patches and Dilaudid.  Pt says she is only taking one or two dilaudid per day.  Suggested pt go off the patches now since she has been on lowest dose of patch.  She can take more dilaudid for BTP as needed.  Pt says she would like to stay on patches one more month.  Dr. Alvy Bimler signed refills and they are ready to pick up. Notified pt she can pick up Rxs from our office tomorrow. She verbalized understanding.

## 2016-04-07 ENCOUNTER — Telehealth: Payer: Self-pay | Admitting: *Deleted

## 2016-04-07 MED FILL — FLUCONAZOLE 100 MG TABLET: 100 | 21 days supply | Qty: 22 | Fill #0

## 2016-04-07 NOTE — Telephone Encounter (Signed)
  Oncology Nurse Navigator Documentation  Navigator Location: [facility] (04/07/16 1559) Navigator Encounter Type: Telephone (04/07/16 1559) Telephone: Outgoing Call (04/07/16 1559)                 Interventions: Coordination of Care (04/07/16 1559)       Called Debra Farrell to inform her of fluconazole Rx at [facility] OP for pick-up, tentative attendance at 05/03/16 H&N [place], and pending call from [facility] ENT re appt to see Dr. Erik Obey in 4-6 weeks per Dr. Isidore Moos.  She voiced understanding.  Gayleen Orem, RN, BSN, [place] at [place] [phone]                   Time Spent with Patient: 15 (04/07/16 1559)

## 2016-04-07 NOTE — Telephone Encounter (Signed)
  Oncology Nurse Navigator Documentation    Navigator Encounter Type: Telephone (04/07/16 1548)                   Interventions: Coordination of Care (04/07/16 1548)       Per Dr. Pearlie Oyster guidance, called [facility]NT to arrange follow-up visit.  Spoke with Michel Bickers, requested that Ms. Woodside be contacted and appt arranged to see Dr. Erik Obey in 4-6 months.   She verbalized understanding.  Gayleen Orem, RN, BSN, [place] at [place] [phone]                   Time Spent with Patient: 15 (04/07/16 1548)

## 2016-04-14 ENCOUNTER — Telehealth: Payer: Self-pay | Admitting: *Deleted

## 2016-04-14 NOTE — Telephone Encounter (Signed)
  Oncology Nurse Navigator Documentation  Navigator Location: [facility] (04/14/16 1740) Navigator Encounter Type: Telephone (04/14/16 1740) Telephone: Diagnostic Results;Appt Confirmation/Clarification (04/14/16 1740)                     Per Dr. Alvy Bimler, called Debra Farrell to inform her that discussion of her recent PET at Wednesday's H&N Tumor Board meeting indicated lung nodule is stable.  She indicated Dr. Isidore Moos had informed her of this as well.  I mentioned that I will be calling her next week to arrange her attendance at the 05/03/16 H&N [place] to have follow-up with Nutrition, SLP, PT and SW.  She voiced understanding.  Gayleen Orem, RN, BSN, [place] at [place] [phone]                       Time Spent with Patient: 15 (04/14/16 1740)

## 2016-04-26 DIAGNOSIS — E43 Unspecified severe protein-calorie malnutrition: Secondary | ICD-10-CM | POA: Diagnosis not present

## 2016-04-26 DIAGNOSIS — C01 Malignant neoplasm of base of tongue: Secondary | ICD-10-CM | POA: Diagnosis not present

## 2016-05-02 ENCOUNTER — Other Ambulatory Visit: Payer: Self-pay | Admitting: *Deleted

## 2016-05-02 ENCOUNTER — Telehealth: Payer: Self-pay | Admitting: *Deleted

## 2016-05-02 DIAGNOSIS — C01 Malignant neoplasm of base of tongue: Secondary | ICD-10-CM

## 2016-05-02 NOTE — Telephone Encounter (Signed)
  Oncology Nurse Navigator Documentation  In response to VM from patient daughter and routed message from [place], spoke with daughter to inform her that her mother's PAC and associated pain will be assessed during H&N [place] tomorrow morning, that she will see Dr. Alvy Bimler if necessary.  She voiced understanding.  Gayleen Orem, RN, BSN, [place] at [facility] [phone]    Navigator Location: [place] [phone]/07/17 1510) Navigator Encounter Type: Telephone (05/02/16 1510) Telephone: [place] Call (05/02/16 1510)           Treatment Phase: Follow-up (05/02/16 1510) Barriers/Navigation Needs: Coordination of Care (05/02/16 1510)                          Time Spent with Patient: 15 (05/02/16 1510)

## 2016-05-02 NOTE — Telephone Encounter (Signed)
Pt's daughter, Abigail Butts, Left VM states pt has been having "excessive" pain at [facility] site.  Pt has appt in morning for MDC.  Dr. Alvy Bimler requests Gayleen Orem to assess pt's pain during [facility] and let Dr. Alvy Bimler know if he thinks pt needs to be seen by her tomorrow after [place]? LVM for Gayleen Orem, RN Navigator.

## 2016-05-02 NOTE — Telephone Encounter (Signed)
  Oncology Nurse Navigator Documentation  Spoke with Ms. Debra Farrell, confirmed her attendance at tomorrow morning's H&N [place] with arrival time of 0800, reviewed check-in procedure.  She voiced understanding.  Gayleen Orem, RN, BSN, [place] at [facility] [phone]   Navigator Location: [place] [phone]/07/17 1222) Navigator Encounter Type: Telephone (05/02/16 1222) Telephone: Appt Confirmation/Clarification (05/02/16 1222)           Treatment Phase: Post-Tx Follow-up (05/02/16 1222) Barriers/Navigation Needs: Coordination of Care (05/02/16 1222)                          Time Spent with Patient: 15 (05/02/16 1222)

## 2016-05-03 ENCOUNTER — Encounter: Payer: Self-pay | Admitting: *Deleted

## 2016-05-03 ENCOUNTER — Ambulatory Visit
Admission: RE | Admit: 2016-05-03 | Discharge: 2016-05-03 | Disposition: A | Discharge status: Home / Self Care | Payer: 59 | Source: Ambulatory Visit | Attending: Radiation Oncology | Admitting: Radiation Oncology

## 2016-05-03 ENCOUNTER — Ambulatory Visit: Payer: 59 | Attending: Radiation Oncology

## 2016-05-03 ENCOUNTER — Ambulatory Visit: Payer: 59 | Admitting: Nutrition

## 2016-05-03 ENCOUNTER — Ambulatory Visit: Payer: 59 | Admitting: Physical Therapy

## 2016-05-03 DIAGNOSIS — M546 Pain in thoracic spine: Secondary | ICD-10-CM

## 2016-05-03 DIAGNOSIS — I890 Lymphedema, not elsewhere classified: Secondary | ICD-10-CM | POA: Diagnosis not present

## 2016-05-03 DIAGNOSIS — M6281 Muscle weakness (generalized): Secondary | ICD-10-CM | POA: Insufficient documentation

## 2016-05-03 DIAGNOSIS — M2560 Stiffness of unspecified joint, not elsewhere classified: Secondary | ICD-10-CM | POA: Insufficient documentation

## 2016-05-03 DIAGNOSIS — R1310 Dysphagia, unspecified: Secondary | ICD-10-CM | POA: Diagnosis not present

## 2016-05-03 DIAGNOSIS — Z9189 Other specified personal risk factors, not elsewhere classified: Secondary | ICD-10-CM | POA: Insufficient documentation

## 2016-05-03 NOTE — Therapy (Signed)
[facility] [address] [place], [place], [address] Phone: [phone]   Fax:  [phone]  Speech Language Pathology Treatment  Patient Details  Name: Debra Farrell MRN: [id number] Date of Birth: 1945-10-10 Referring Provider: [name] MD  Encounter Date: 05/03/2016      End of Session - 05/03/16 1154    Visit Number 4   Number of Visits 6   Date for SLP Re-Evaluation 05/23/16   SLP Start Time 0815   SLP Stop Time  0855   SLP Time Calculation (min) 40 min      Past Medical History:  Diagnosis Date  . Abdominal abscess ([place]) 11/30/2015  . Anemia in chronic illness 12/16/2015  . Cancer of base of tongue ([place]) 10/14/2015  . Eczema   . Hx of radiation therapy completed 01/05/16   Base of Tongue  . Hx of radiation therapy    Left Lower Lung  . Hyperlipidemia   . Hypertension   . Infection with methicillin-resistant Staphylococcus aureus (MRSA) 11/30/2015  . Intractable nausea and vomiting 11/13/2015  . Laceration 04/2014   around R ear, for falling out of bed & hitting nightstand  . Nausea without vomiting 12/16/2015  . Thalassemia minor   . Throat pain in adult 12/29/2015    Past Surgical History:  Procedure Laterality Date  . ABDOMINAL HYSTERECTOMY  1990   partial  . DIRECT LARYNGOSCOPY N/A 10/28/2015   Procedure: DIRECT LARYNGOSCOPY WITH BIOPSY;  Surgeon: [name], MD;  Location: [facility];  Service: ENT;  Laterality: N/A;  . ESOPHAGOSCOPY N/A 10/28/2015   Procedure: ESOPHAGOSCOPY;  Surgeon: [name], MD;  Location: [facility];  Service: ENT;  Laterality: N/A;  . [place], [id number]  . LARYNGOSCOPY AND BRONCHOSCOPY N/A 10/28/2015   Procedure: BRONCHOSCOPY;  Surgeon: [name], MD;  Location: [facility];  Service: ENT;  Laterality: N/A;  . MULTIPLE EXTRACTIONS WITH ALVEOLOPLASTY N/A 10/28/2015   Procedure: Extraction of tooth #'s 2-12, 14,15,17,18,20-29, 31 with alveoloplasy and mandibular left  torus reduction;  Surgeon: Lenn Cal, DDS;  Location: [facility];  Service: Oral Surgery;  Laterality: N/A;  . RECTOCELE REPAIR    . TONSILLECTOMY     as a child  . urocele     correction surgery    There were no vitals filed for this visit.      Subjective Assessment - 05/03/16 0832    Subjective "I can only keep water down and some soups." "I don't clear 'cause it's going down the wrong way, it 'cause of the mucous."               ADULT SLP TREATMENT - 05/03/16 0854      General Information   Behavior/Cognition Alert;Cooperative;Pleasant mood     Treatment Provided   Treatment provided Dysphagia     Dysphagia Treatment   Temperature Spikes Noted No   Respiratory Status Room air   Treatment Methods Therapeutic exercise;Patient/caregiver education   Type of PO's observed Thin liquids   Feeding Able to feed self   Pharyngeal Phase Signs & Symptoms Delayed throat clear;Immediate throat clear   Other treatment/comments No overt s/s aspiration PNA. Pt performed all HEP with only rare min A (chin push execise). SLP added chin tuck against resistance as a non-swallowing exercise. Suggested pt perform non-swallowing and swallowing exercises alternatively to provide rest in order to get more reps of each exercise in. Pt has not performed HEP as directed (performing a few times  therapeutic intervention in order to improve the following deficits and impairments:   Dysphagia - Plan: SLP plan of care cert/re-cert    Problem List Patient Active Problem List   Diagnosis Date Noted  . S/P gastrostomy ([place]) 03/16/2016  . Malignant neoplasm of lower lobe of left lung ([place]) 02/17/2016  . Malnutrition of moderate degree 01/04/2016  . Goals of care, counseling/discussion   . Dysphagia 12/31/2015  . 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 Encounter for palliative care   . Odynophagia   . Throat pain in adult 12/29/2015  . Nausea without vomiting 12/16/2015  . Anemia in chronic illness 12/16/2015  . Anemia due to other cause   . Dehydration   . Severe protein-calorie malnutrition ([place]) 11/14/2015  . Intractable nausea and vomiting 11/13/2015  . Gastritis, acute 11/13/2015  . Constipation due to opioid therapy 11/10/2015  . Cancer of base of tongue ([place]) 10/14/2015  . Skin lesion of back 03/21/2013  . Lipoma of axilla 03/21/2013  . Sleep disorder 03/17/2013  . Skin lesion 03/17/2013  . Health care maintenance 03/17/2013  . Right knee pain 04/05/2012  . Health maintenance examination 10/17/2011  . Thalassemia 10/17/2011  . Essential hypertension 10/17/2011  . Hyperlipidemia 10/17/2011  . Headache(784.0) 10/17/2011    [place] ,[place], [place]  05/03/2016, 12:05 PM  [facility] [address] [place], [place], [address] Phone: [phone]   Fax:  [phone]   Name: Debra Farrell MRN: [id number] Date of Birth: 12-09-1945  [facility] [address] [place], [place], [address] Phone: [phone]   Fax:  [phone]  Speech Language Pathology Treatment  Patient Details  Name: Debra Farrell MRN: [id number] Date of Birth: 1945-10-10 Referring Provider: [name] MD  Encounter Date: 05/03/2016      End of Session - 05/03/16 1154    Visit Number 4   Number of Visits 6   Date for SLP Re-Evaluation 05/23/16   SLP Start Time 0815   SLP Stop Time  0855   SLP Time Calculation (min) 40 min      Past Medical History:  Diagnosis Date  . Abdominal abscess ([place]) 11/30/2015  . Anemia in chronic illness 12/16/2015  . Cancer of base of tongue ([place]) 10/14/2015  . Eczema   . Hx of radiation therapy completed 01/05/16   Base of Tongue  . Hx of radiation therapy    Left Lower Lung  . Hyperlipidemia   . Hypertension   . Infection with methicillin-resistant Staphylococcus aureus (MRSA) 11/30/2015  . Intractable nausea and vomiting 11/13/2015  . Laceration 04/2014   around R ear, for falling out of bed & hitting nightstand  . Nausea without vomiting 12/16/2015  . Thalassemia minor   . Throat pain in adult 12/29/2015    Past Surgical History:  Procedure Laterality Date  . ABDOMINAL HYSTERECTOMY  1990   partial  . DIRECT LARYNGOSCOPY N/A 10/28/2015   Procedure: DIRECT LARYNGOSCOPY WITH BIOPSY;  Surgeon: [name], MD;  Location: [facility];  Service: ENT;  Laterality: N/A;  . ESOPHAGOSCOPY N/A 10/28/2015   Procedure: ESOPHAGOSCOPY;  Surgeon: [name], MD;  Location: [facility];  Service: ENT;  Laterality: N/A;  . [place], [id number]  . LARYNGOSCOPY AND BRONCHOSCOPY N/A 10/28/2015   Procedure: BRONCHOSCOPY;  Surgeon: [name], MD;  Location: [facility];  Service: ENT;  Laterality: N/A;  . MULTIPLE EXTRACTIONS WITH ALVEOLOPLASTY N/A 10/28/2015   Procedure: Extraction of tooth #'s 2-12, 14,15,17,18,20-29, 31 with alveoloplasy and mandibular left  torus reduction;  Surgeon: Lenn Cal, DDS;  Location: [facility];  Service: Oral Surgery;  Laterality: N/A;  . RECTOCELE REPAIR    . TONSILLECTOMY     as a child  . urocele     correction surgery    There were no vitals filed for this visit.      Subjective Assessment - 05/03/16 0832    Subjective "I can only keep water down and some soups." "I don't clear 'cause it's going down the wrong way, it 'cause of the mucous."               ADULT SLP TREATMENT - 05/03/16 0854      General Information   Behavior/Cognition Alert;Cooperative;Pleasant mood     Treatment Provided   Treatment provided Dysphagia     Dysphagia Treatment   Temperature Spikes Noted No   Respiratory Status Room air   Treatment Methods Therapeutic exercise;Patient/caregiver education   Type of PO's observed Thin liquids   Feeding Able to feed self   Pharyngeal Phase Signs & Symptoms Delayed throat clear;Immediate throat clear   Other treatment/comments No overt s/s aspiration PNA. Pt performed all HEP with only rare min A (chin push execise). SLP added chin tuck against resistance as a non-swallowing exercise. Suggested pt perform non-swallowing and swallowing exercises alternatively to provide rest in order to get more reps of each exercise in. Pt has not performed HEP as directed (performing a few times

## 2016-05-03 NOTE — Patient Instructions (Signed)
Towel hold/Chin Tuck against resistance: Hold a rolled up hand towel or pool noodle (about 4 inches in diameter) under your chin for 45-60 seconds, three times, twice a day Hold the same towel under your chin for 1 second, 30 times, twice a day

## 2016-05-03 NOTE — Progress Notes (Signed)
Nutrition follow up completed in Head and Neck Clinic.  Patient continues using feeding tube utilizing Osmolite 1.5, 6 cans daily, via bolus method. She can swallow water and broth only. She is using 45 mL ProMod daily. Complains of extreme fatigue. She has only been doing swallowing exercises 3 days a week. Weight improved and documented as 148 pounds, improved from 143 pounds May 3.  Nutrition Diagnosis: Inadequate oral intake continues. Inadequate enteral nutrition infusion resolved.  Intervention: Continue 2 cans Osmolite 1.5 TID with 120 cc free water before and after bolus feeding. Continue 45 mL promod daily. Drink additional 500 cc free water daily. Educated patient on liquids and foods to begin adding to daily intake. Questions answered and teach back method used.  Monitoring, Evaluation, Goals:  Patient will add oral intake to tube feeding to continue healing and weight gain/maintenance.  Next Visit: To be scheduled as needed.

## 2016-05-03 NOTE — Progress Notes (Signed)
Head & Neck Multidisciplinary Clinic Clinical Social Work  Clinical Social Work met with patient/family at head & neck multidisciplinary clinic to offer support and assess for psychosocial needs.  Debra Farrell was accompanied by her daughter.  The patient indicated her main concern at this time was her disability benefits- she shared she recently received a letter that her benefits would be ending soon.  CSW encouraged patient to contact HR for help navigating her employer's short term and long term disability benefits.   Patient agreed for CSW to make referral to [facility] to assist with applying for social security disability.  [facility] to [facility].   Clinical Social Work briefly discussed Clinical Social Work role and [employer] support programs/services.  Clinical Social Work encouraged patient to call with any additional questions or concerns.   Polo Riley, MSW, LCSW, OSW-C Clinical Social Worker [facility] [phone]

## 2016-05-03 NOTE — Therapy (Signed)
[facility] [address] [place], [place], [address] Phone: [phone]   Fax:  [phone]  Physical Therapy Evaluation  Patient Details  Name: Debra Farrell MRN: [id number] Date of Birth: 19-Aug-1946 Referring Provider: Dr. Artis Delay  Encounter Date: 05/03/2016      PT End of Session - 05/03/16 1229    Visit Number 1   Number of Visits 9   Date for PT Re-Evaluation 06/03/16   PT Start Time 0945   PT Stop Time 1020   PT Time Calculation (min) 35 min   Activity Tolerance Patient tolerated treatment well   Behavior During Therapy [facility] for tasks assessed/performed      Past Medical History:  Diagnosis Date  . Abdominal abscess (HCC) 11/30/2015  . Anemia in chronic illness 12/16/2015  . 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  . Eczema   . Hx of radiation therapy completed 01/05/16   Base of Tongue  . Hx of radiation therapy    Left Lower Lung  . Hyperlipidemia   . Hypertension   . Infection with methicillin-resistant Staphylococcus aureus (MRSA) 11/30/2015  . Intractable nausea and vomiting 11/13/2015  . Laceration 04/2014   around R ear, for falling out of bed & hitting nightstand  . Nausea without vomiting 12/16/2015  . Thalassemia minor   . Throat pain in adult 12/29/2015    Past Surgical History:  Procedure Laterality Date  . ABDOMINAL HYSTERECTOMY  1990   partial  . DIRECT LARYNGOSCOPY N/A 10/28/2015   Procedure: DIRECT LARYNGOSCOPY WITH BIOPSY;  Surgeon: Flo Shanks, MD;  Location: [facility] OR;  Service: ENT;  Laterality: N/A;  . ESOPHAGOSCOPY N/A 10/28/2015   Procedure: ESOPHAGOSCOPY;  Surgeon: Flo Shanks, MD;  Location: [facility] OR;  Service: ENT;  Laterality: N/A;  . INCONTINENCE SURGERY  1990, [id number]  . LARYNGOSCOPY AND BRONCHOSCOPY N/A 10/28/2015   Procedure: BRONCHOSCOPY;  Surgeon: Flo Shanks, MD;  Location: [facility] OR;  Service: ENT;  Laterality: N/A;  . MULTIPLE EXTRACTIONS WITH ALVEOLOPLASTY N/A 10/28/2015   Procedure: Extraction of tooth #'s 2-12, 14,15,17,18,20-29, 31 with alveoloplasy and mandibular left torus reduction;  Surgeon: Charlynne Pander, DDS;  Location: [facility] OR;  Service: Oral Surgery;  Laterality: N/A;  . RECTOCELE REPAIR    . TONSILLECTOMY     as a child  . urocele     correction surgery    There were no vitals filed for this visit.       Subjective Assessment - 05/03/16 1213    Subjective Having some pain since radiation treatment; also deconditioned. Shampooed carpets recently, which took 4.5 hours (including a 1-hour rest), and that made her nauseated and have abdominal cramps; neck also felt tired with this.   Patient is accompained by: Family member  daughter   Pertinent History Pt. with right neck mass x 6 months.  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  She completed XRT for neck in May and then was found to have lung nodules (metastases) and was treated with 9 SBRT sessions, sompleted 03/07/16.   HTN.   Patient Stated Goals get help with pain, fatigue   Currently in Pain? 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 Artis Delay     Precautions   Precautions Other (comment)   Precaution Comments cancer precautions     Restrictions   Weight Bearing Restrictions No     Balance Screen   Has the patient had a decrease in  activity level because of a fear of falling?  No   Is the patient reluctant to leave their home because of a fear of falling?  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, civil (consulting) at [employer]; on disability   Leisure not doing regular exercise, but does mow her lawn (and says she paces herself, does it in sections); cleaned carpet recently     Cognition   Overall Cognitive Status Within Functional Limits for tasks assessed     Observation/Other Assessments   Observations she has discoloration of neck skin from radiation; her neck tissue does appear loose and a bit full (looks like lymphedema)     Functional Tests   Functional tests Sit to Stand     Sit to Stand   Comments 16 repetitions in 30 seconds, still above average for age     Posture/Postural Control   Posture/Postural Control Postural limitations   Postural Limitations Rounded Shoulders;Forward head  slight     AROM   Overall AROM Comments neck AROM grossly 25% loss all motions except flexion     Palpation   Palpation comment neck tissue is soft; she has palpable trigger points along medial aspect of right scapula that do reproduce her pain when firmly palpated     Ambulation/Gait   Ambulation/Gait Yes   Ambulation/Gait Assistance 7: Independent           LYMPHEDEMA/ONCOLOGY QUESTIONNAIRE - 05/03/16 1228      Head and Neck   4 cm superior to sternal notch around neck 37.8 cm   6 cm superior to sternal notch around neck 38.1 cm   8 cm superior to sternal notch around neck 38.6 cm                                Long Term Clinic Goals - 05/03/16 1240      CC Long Term Goal  #1   Title Right chest and upper back pain reduced by at least 60%.   Baseline 7/20 on eval   Time 4    Period Weeks   Status New     CC Long Term Goal  #2   Title knowledgeable about self-care for neck lymphedema   Time 4   Period Weeks   Status New     CC Long Term Goal  #3   Title independent in HEP with focus on building endurance and on neck strengthening for improved ADLs   Time 4  Period Weeks   Status New     CC Long Term Goal  #4   Title will report that her fatigue level has decreased compared to start of therapy         Head and Neck Clinic Goals - 10/20/15 1123      Patient will be able to verbalize understanding of a home exercise program for cervical range of motion, posture, and walking.    Status Achieved     Patient will be able to verbalize understanding of proper sitting and standing posture.    Status Achieved     Patient will be able to verbalize understanding of lymphedema risk and availability of treatment for this condition.    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  She reports decreased tolerance for activity, pain around right chest to scapula, and neck fatigue.  She appears to have lymphedema and neck circumference measurements show an increase at 2 of 3 levels despite her weighing some 18 lbs. less than on prior evaluation.   Rehab Potential Excellent   PT Frequency 2x / week   PT Duration 4 weeks   PT Treatment/Interventions ADLs/Self Care Home Management;Therapeutic exercise;Patient/family education;Manual techniques;Manual lymph drainage;Compression bandaging;Passive range of motion;Electrical Stimulation;Cryotherapy   PT Next Visit Plan Begin trigger point release, soft tissue work, and myofascial release to right periscapular area.  Test thoracic extension for pain relief.  Begin endurance and  strength training exercise toward increasing her tolerance for activity.  Home exercise program instruction.  Make chip pack and begin manual lymph drainage when able.  Modalities for pain prn.  Check neck muscle strength and include neck strengthening in HEP.   Consulted and Agree with Plan of Care Patient      Patient will benefit from skilled therapeutic intervention in order to improve the following deficits and impairments:  Decreased activity tolerance, Decreased knowledge of use of DME, Decreased range of motion, Decreased strength, Increased edema, Pain  Visit Diagnosis: Pain in thoracic spine - Plan: PT plan of care cert/re-cert  Lymphedema, not elsewhere classified - Plan: PT plan of care cert/re-cert  Muscle weakness (generalized) - Plan: PT plan of care cert/re-cert     Problem List Patient Active Problem List   Diagnosis Date Noted  . S/P gastrostomy (HCC) 03/16/2016  . Malignant neoplasm of lower lobe of left lung (HCC) 02/17/2016  . Malnutrition of moderate degree 01/04/2016  . Goals of care, counseling/discussion   . Dysphagia 12/31/2015  . 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 Encounter for palliative care   . Odynophagia   . Throat pain in adult 12/29/2015  . Nausea without vomiting 12/16/2015  . Anemia in chronic illness 12/16/2015  . Anemia due to other cause   . Dehydration   . Severe protein-calorie malnutrition (HCC) 11/14/2015  . Intractable nausea and vomiting 11/13/2015  . Gastritis, acute 11/13/2015  . Constipation due to opioid therapy 11/10/2015  . 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  . Skin lesion of back 03/21/2013  . Lipoma of axilla 03/21/2013  . Sleep disorder 03/17/2013  . Skin lesion 03/17/2013  . Health care maintenance 03/17/2013  . Right knee pain 04/05/2012  . Health maintenance examination 10/17/2011  . Thalassemia  10/17/2011  . Essential hypertension 10/17/2011  . Hyperlipidemia 10/17/2011  . Headache(784.0)  10/17/2011    Arsenia Goracke 05/03/2016, 12:46 PM  [facility] [address] [place], [place], [address] Phone: [phone]   Fax:  [phone]  Name: GLAYDS ROHWEDER MRN: [id number] Date of Birth: November 15, 1945  Micheline Maze, PT 05/03/16 12:46 PM

## 2016-05-04 ENCOUNTER — Ambulatory Visit: Payer: 59 | Admitting: Physical Therapy

## 2016-05-04 DIAGNOSIS — M546 Pain in thoracic spine: Secondary | ICD-10-CM | POA: Diagnosis not present

## 2016-05-04 DIAGNOSIS — M2560 Stiffness of unspecified joint, not elsewhere classified: Secondary | ICD-10-CM | POA: Diagnosis not present

## 2016-05-04 DIAGNOSIS — I890 Lymphedema, not elsewhere classified: Secondary | ICD-10-CM | POA: Diagnosis not present

## 2016-05-04 DIAGNOSIS — M6281 Muscle weakness (generalized): Secondary | ICD-10-CM | POA: Diagnosis not present

## 2016-05-04 DIAGNOSIS — Z9189 Other specified personal risk factors, not elsewhere classified: Secondary | ICD-10-CM | POA: Diagnosis not present

## 2016-05-04 DIAGNOSIS — R1310 Dysphagia, unspecified: Secondary | ICD-10-CM | POA: Diagnosis not present

## 2016-05-04 NOTE — Therapy (Addendum)
[place], [place], [address] Phone: [phone]   Fax:  [phone]  Physical Therapy Treatment  Patient Details  Name: Debra Farrell MRN: [id number] Date of Birth: 1946/09/12 Referring Provider: Dr. [name]  Encounter Date: 05/04/2016      PT End of Session - 05/04/16 2045    Visit Number 2   Number of Visits 9   Date for PT Re-Evaluation 06/03/16   PT Start Time 1025   PT Stop Time 1115   PT Time Calculation (min) 50 min   Activity Tolerance Patient tolerated treatment well   Behavior During Therapy [facility] for tasks assessed/performed      Past Medical History:  Diagnosis Date  . Abdominal abscess ([place]) 11/30/2015  . Anemia in chronic illness 12/16/2015  . Cancer of base of tongue ([place]) 10/14/2015  . Eczema   . Hx of radiation therapy completed 01/05/16   Base of Tongue  . Hx of radiation therapy    Left Lower Lung  . Hyperlipidemia   . Hypertension   . Infection with methicillin-resistant Staphylococcus aureus (MRSA) 11/30/2015  . Intractable nausea and vomiting 11/13/2015  . Laceration 04/2014   around R ear, for falling out of bed & hitting nightstand  . Nausea without vomiting 12/16/2015  . Thalassemia minor   . Throat pain in adult 12/29/2015    Past Surgical History:  Procedure Laterality Date  . ABDOMINAL HYSTERECTOMY  1990   partial  . DIRECT LARYNGOSCOPY N/A 10/28/2015   Procedure: DIRECT LARYNGOSCOPY WITH BIOPSY;  Surgeon: [name], MD;  Location: [place];  Service: ENT;  Laterality: N/A;  . ESOPHAGOSCOPY N/A 10/28/2015   Procedure: ESOPHAGOSCOPY;  Surgeon: [name], MD;  Location: [place];  Service: ENT;  Laterality: N/A;  . [place], [id number]  . LARYNGOSCOPY AND BRONCHOSCOPY N/A 10/28/2015   Procedure: BRONCHOSCOPY;  Surgeon: [name], MD;  Location: [place];  Service: ENT;  Laterality: N/A;  . MULTIPLE EXTRACTIONS WITH ALVEOLOPLASTY N/A 10/28/2015    Procedure: Extraction of tooth #'s 2-12, 14,15,17,18,20-29, 31 with alveoloplasy and mandibular left torus reduction;  Surgeon: Lenn Cal, DDS;  Location: [place];  Service: Oral Surgery;  Laterality: N/A;  . RECTOCELE REPAIR    . TONSILLECTOMY     as a child  . urocele     correction surgery    There were no vitals filed for this visit.      Subjective Assessment - 05/04/16 1028    Subjective (P)  Nothing new. Patient reports more pain when she is driving, but has no pain currently.   Pertinent History (P)  Pt. with right neck mass x 6 months.  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  She completed XRT for neck in May and then was found to have lung nodules (metastases) and was treated with 9 SBRT sessions, sompleted 03/07/16.   HTN.   Currently in Pain? (P)  No/denies                         [facility] Adult PT Treatment/Exercise - 05/05/16 0001      Self-Care   Self-Care Other Self-Care Comments   Other Self-Care Comments  Fashioned foam chip pack in stockinette and instructed patient in its use for compression around her neck.  [place], [place], [address] Phone: [phone]   Fax:  [phone]  Physical Therapy Treatment  Patient Details  Name: Debra Farrell MRN: [id number] Date of Birth: 1946/09/12 Referring Provider: Dr. [name]  Encounter Date: 05/04/2016      PT End of Session - 05/04/16 2045    Visit Number 2   Number of Visits 9   Date for PT Re-Evaluation 06/03/16   PT Start Time 1025   PT Stop Time 1115   PT Time Calculation (min) 50 min   Activity Tolerance Patient tolerated treatment well   Behavior During Therapy [facility] for tasks assessed/performed      Past Medical History:  Diagnosis Date  . Abdominal abscess ([place]) 11/30/2015  . Anemia in chronic illness 12/16/2015  . Cancer of base of tongue ([place]) 10/14/2015  . Eczema   . Hx of radiation therapy completed 01/05/16   Base of Tongue  . Hx of radiation therapy    Left Lower Lung  . Hyperlipidemia   . Hypertension   . Infection with methicillin-resistant Staphylococcus aureus (MRSA) 11/30/2015  . Intractable nausea and vomiting 11/13/2015  . Laceration 04/2014   around R ear, for falling out of bed & hitting nightstand  . Nausea without vomiting 12/16/2015  . Thalassemia minor   . Throat pain in adult 12/29/2015    Past Surgical History:  Procedure Laterality Date  . ABDOMINAL HYSTERECTOMY  1990   partial  . DIRECT LARYNGOSCOPY N/A 10/28/2015   Procedure: DIRECT LARYNGOSCOPY WITH BIOPSY;  Surgeon: [name], MD;  Location: [place];  Service: ENT;  Laterality: N/A;  . ESOPHAGOSCOPY N/A 10/28/2015   Procedure: ESOPHAGOSCOPY;  Surgeon: [name], MD;  Location: [place];  Service: ENT;  Laterality: N/A;  . [place], [id number]  . LARYNGOSCOPY AND BRONCHOSCOPY N/A 10/28/2015   Procedure: BRONCHOSCOPY;  Surgeon: [name], MD;  Location: [place];  Service: ENT;  Laterality: N/A;  . MULTIPLE EXTRACTIONS WITH ALVEOLOPLASTY N/A 10/28/2015    Procedure: Extraction of tooth #'s 2-12, 14,15,17,18,20-29, 31 with alveoloplasy and mandibular left torus reduction;  Surgeon: Lenn Cal, DDS;  Location: [place];  Service: Oral Surgery;  Laterality: N/A;  . RECTOCELE REPAIR    . TONSILLECTOMY     as a child  . urocele     correction surgery    There were no vitals filed for this visit.      Subjective Assessment - 05/04/16 1028    Subjective (P)  Nothing new. Patient reports more pain when she is driving, but has no pain currently.   Pertinent History (P)  Pt. with right neck mass x 6 months.  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  She completed XRT for neck in May and then was found to have lung nodules (metastases) and was treated with 9 SBRT sessions, sompleted 03/07/16.   HTN.   Currently in Pain? (P)  No/denies                         [facility] Adult PT Treatment/Exercise - 05/05/16 0001      Self-Care   Self-Care Other Self-Care Comments   Other Self-Care Comments  Fashioned foam chip pack in stockinette and instructed patient in its use for compression around her neck.  List   Diagnosis Date Noted  . S/P gastrostomy ([place]) 03/16/2016  . Malignant neoplasm of lower lobe of left lung ([place]) 02/17/2016  . Malnutrition of moderate degree 01/04/2016  . Goals of care, counseling/discussion   . Dysphagia 12/31/2015  . 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 Encounter for palliative care   . Odynophagia   . Throat pain in adult 12/29/2015  . Nausea without vomiting 12/16/2015  . Anemia in chronic illness 12/16/2015  . Anemia due to other cause   . Dehydration   . Severe protein-calorie malnutrition ([place]) 11/14/2015  . Intractable nausea and vomiting 11/13/2015  . Gastritis, acute 11/13/2015  . Constipation due to opioid therapy 11/10/2015  . Cancer of base of tongue ([place]) 10/14/2015  . Skin lesion of back 03/21/2013  . Lipoma of axilla 03/21/2013  . Sleep disorder 03/17/2013  . Skin lesion 03/17/2013  . Health care maintenance 03/17/2013  . Right knee pain 04/05/2012  . Health maintenance examination 10/17/2011  . Thalassemia 10/17/2011  . Essential hypertension 10/17/2011  . Hyperlipidemia 10/17/2011  . Headache(784.0) 10/17/2011    SALISBURY,DONNA 05/05/2016, 7:59 AM  [place], [place], [address] Phone: [phone]   Fax:  [phone]  Name: Debra Farrell MRN: [id number] Date of Birth: 06/19/1946   Serafina Royals, PT 05/05/16 7:59  AM  Serafina Royals, PT 05/05/16 7:59 AM

## 2016-05-06 NOTE — Progress Notes (Signed)
  Oncology Nurse Navigator Documentation  Met with [name] during H&N [place].  She was accompanied by her daughter.  Provided verbal and written overview of [facility], the clinicians who will be seeing her, encouraged her to ask questions.  She was seen by SLP. Nutrition, PT and SW.  Spoke with her at end of [facility], she denied any questions.  Gayleen Orem, RN, BSN, [place] at [facility] [phone]   Navigator Location: [facility] (05/03/16 0800) Navigator Encounter Type: Clinic/MDC (05/03/16 0800)             Treatment Phase: Follow-up (05/03/16 0800)                            Time Spent with Patient: 75 (05/03/16 0800)

## 2016-05-10 ENCOUNTER — Ambulatory Visit: Payer: 59

## 2016-05-10 DIAGNOSIS — I890 Lymphedema, not elsewhere classified: Secondary | ICD-10-CM | POA: Diagnosis not present

## 2016-05-10 DIAGNOSIS — R1310 Dysphagia, unspecified: Secondary | ICD-10-CM | POA: Diagnosis not present

## 2016-05-10 DIAGNOSIS — Z9189 Other specified personal risk factors, not elsewhere classified: Secondary | ICD-10-CM

## 2016-05-10 DIAGNOSIS — M546 Pain in thoracic spine: Secondary | ICD-10-CM | POA: Diagnosis not present

## 2016-05-10 DIAGNOSIS — M2560 Stiffness of unspecified joint, not elsewhere classified: Secondary | ICD-10-CM

## 2016-05-10 DIAGNOSIS — M6281 Muscle weakness (generalized): Secondary | ICD-10-CM

## 2016-05-10 MED FILL — DOXEPIN 25 MG CAPSULE: 25 | 7 days supply | Qty: 30 | Fill #2

## 2016-05-10 MED FILL — ONDANSETRON HCL 8 MG TABLET: 8 | 15 days supply | Qty: 30 | Fill #0

## 2016-05-10 NOTE — Therapy (Signed)
[facility] [address] [place], [place], [address] Phone: [phone]   Fax:  [phone]  Physical Therapy Treatment  Patient Details  Name: Debra Farrell MRN: [id number] Date of Birth: [birth year] Referring Provider: Dr. Artis Delay  Encounter Date: 05/10/2016      PT End of Session - 05/10/16 1107    Visit Number 3   Number of Visits 9   Date for PT Re-Evaluation 06/03/16   PT Start Time 1022   PT Stop Time 1107   PT Time Calculation (min) 45 min   Activity Tolerance Patient tolerated treatment well   Behavior During Therapy [facility] for tasks assessed/performed      Past Medical History:  Diagnosis Date  . Abdominal abscess (HCC) 11/30/2015  . Anemia in chronic illness 12/16/2015  . 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  . Eczema   . Hx of radiation therapy completed 01/05/16   Base of Tongue  . Hx of radiation therapy    Left Lower Lung  . Hyperlipidemia   . Hypertension   . Infection with methicillin-resistant Staphylococcus aureus (MRSA) 11/30/2015  . Intractable nausea and vomiting 11/13/2015  . Laceration 04/2014   around R ear, for falling out of bed & hitting nightstand  . Nausea without vomiting 12/16/2015  . Thalassemia minor   . Throat pain in adult 12/29/2015    Past Surgical History:  Procedure Laterality Date  . ABDOMINAL HYSTERECTOMY  1990   partial  . DIRECT LARYNGOSCOPY N/A 10/28/2015   Procedure: DIRECT LARYNGOSCOPY WITH BIOPSY;  Surgeon: Flo Shanks, MD;  Location: [facility] OR;  Service: ENT;  Laterality: N/A;  . ESOPHAGOSCOPY N/A 10/28/2015   Procedure: ESOPHAGOSCOPY;  Surgeon: Flo Shanks, MD;  Location: [facility] OR;  Service: ENT;  Laterality: N/A;  . INCONTINENCE SURGERY  1990, [id number]  . LARYNGOSCOPY AND BRONCHOSCOPY N/A 10/28/2015   Procedure: BRONCHOSCOPY;  Surgeon: Flo Shanks, MD;  Location: [facility] OR;  Service: ENT;  Laterality: N/A;  . MULTIPLE EXTRACTIONS WITH ALVEOLOPLASTY N/A 10/28/2015   Procedure: Extraction of tooth #'s 2-12, 14,15,17,18,20-29, 31 with alveoloplasy and mandibular left torus reduction;  Surgeon: Charlynne Pander, DDS;  Location: [facility] OR;  Service: Oral Surgery;  Laterality: N/A;  . RECTOCELE REPAIR    . TONSILLECTOMY     as a child  . urocele     correction surgery    There were no vitals filed for this visit.      Subjective Assessment - 05/10/16 1032    Subjective I felt a little bit looser after session last week. I've been walking every day and my endurance with this is improving. I went with my granddaughter to the mall yesterday and had to walk about 3 hours and that was fatiguing.    Pertinent History Pt. with right neck mass x 6 months.  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  She completed XRT for neck in May and then was found to have lung nodules (metastases) and was treated with 9 SBRT sessions, sompleted 03/07/16.   HTN.   Patient Stated Goals get help with pain, fatigue   Currently in Pain? No/denies                         [facility] Adult PT Treatment/Exercise - 05/10/16 0001      Shoulder Exercises: Supine   Other Supine Exercises Supine scapular series with red  theraband  5-8 times each with tactile cuing and demonstration for each exercise.      Manual Therapy   Manual Therapy Soft tissue mobilization;Scapular mobilization   Soft tissue mobilization Started in prone to Rt scapula at medial border for trigger point release; then into Lt S/L for continued work here with and without Rt Ue P/ROM throughout into flexion and horizontal abd-/adduction with trigger point release   Scapular Mobilization When in Lt S/L into all planes during soft tissue mobilization for trigger point release                PT Education - 05/10/16 1106    Education provided Yes   Education Details Supine Scapular Series with red theraband    Person(s) Educated  Patient   Methods Explanation;Demonstration;Handout   Comprehension Verbalized understanding;Returned demonstration;Need further instruction                Long Term Clinic Goals - 05/03/16 1240      CC Long Term Goal  #1   Title Right chest and upper back pain reduced by at least 60%.   Baseline 7/20 on eval   Time 4   Period Weeks   Status New     CC Long Term Goal  #2   Title knowledgeable about self-care for neck lymphedema   Time 4   Period Weeks   Status New     CC Long Term Goal  #3   Title independent in HEP with focus on building endurance and on neck strengthening for improved ADLs   Time 4   Period Weeks   Status New     CC Long Term Goal  #4   Title will report that her fatigue level has decreased compared to start of therapy         Head and Neck Clinic Goals - 10/20/15 1123      Patient will be able to verbalize understanding of a home exercise program for cervical range of motion, posture, and walking.    Status Achieved     Patient will be able to verbalize understanding of proper sitting and standing posture.    Status Achieved     Patient will be able to verbalize understanding of lymphedema risk and availability of treatment for this condition.    Status Achieved           Plan - 05/10/16 1107    Clinical Impression Statement Pt did well with progression of HEP of supine scapular sereies today and did not improvement after last session and todays session reporting "felt a little looser". She has also been wearing her chip pack. Attempted some thoracic extension exercises over towel roll and then over low back of chair and pt could not note much of a difference with pain or feel much of a stretch so stopped these.    Rehab Potential Excellent   PT Frequency 2x / week   PT Duration 4 weeks   PT Treatment/Interventions ADLs/Self Care Home Management;Therapeutic exercise;Patient/family education;Manual techniques;Manual  lymph drainage;Compression bandaging;Passive range of motion;Electrical Stimulation;Cryotherapy   PT Next Visit Plan Assess goals next visit. Continue trigger point release and soft tissue work, if any benefit, and myofascial release to right periscapular area. Begin endurance and review HEP issued today; begin strength training exercise toward increasing her tolerance for activity. Begin manual lymph drainage when able.  Modalities for pain prn.  Check neck muscle strength and include neck strengthening in HEP.  Consider spray and stretch  for trigger points.   PT Home Exercise Plan stretches of scapular retractors; try foam chip pack for neck compression 2-4 hours/day; supine scapular series with red theraband   Consulted and Agree with Plan of Care Patient      Patient will benefit from skilled therapeutic intervention in order to improve the following deficits and impairments:  Decreased activity tolerance, Decreased knowledge of use of DME, Decreased range of motion, Decreased strength, Increased edema, Pain  Visit Diagnosis: Pain in thoracic spine  Lymphedema, not elsewhere classified  Muscle weakness (generalized)  At risk for lymphedema  Joint stiffness of spine     Problem List Patient Active Problem List   Diagnosis Date Noted  . S/P gastrostomy (HCC) 03/16/2016  . Malignant neoplasm of lower lobe of left lung (HCC) 02/17/2016  . Malnutrition of moderate degree 01/04/2016  . Goals of care, counseling/discussion   . Dysphagia 12/31/2015  . 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 Encounter for palliative care   . Odynophagia   . Throat pain in adult 12/29/2015  . Nausea without vomiting 12/16/2015  . Anemia in chronic illness 12/16/2015  . Anemia due to other cause   . Dehydration   . Severe protein-calorie malnutrition (HCC) 11/14/2015  . Intractable nausea and vomiting 11/13/2015  . Gastritis, acute 11/13/2015  . Constipation due to opioid therapy  11/10/2015  . 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  . Skin lesion of back 03/21/2013  . Lipoma of axilla 03/21/2013  . Sleep disorder 03/17/2013  . Skin lesion 03/17/2013  . Health care maintenance 03/17/2013  . Right knee pain 04/05/2012  . Health maintenance examination 10/17/2011  . Thalassemia 10/17/2011  . Essential hypertension 10/17/2011  . Hyperlipidemia 10/17/2011  . Headache(784.0) 10/17/2011    Hermenia Bers, PTA 05/10/2016, 12:30 PM  [facility] [address] [place], [place], [address] Phone: [phone]   Fax:  [phone]  Name: SHAMONI MEARS MRN: [id number] Date of Birth: 06/04/1946

## 2016-05-10 NOTE — Patient Instructions (Signed)
Over Head Pull: Narrow and Wide Grip     Cancer Rehab [id number]   On back, knees bent, feet flat, band across thighs, elbows straight but relaxed. Pull hands apart (start). Keeping elbows straight, bring arms up and over head, hands toward floor. Keep pull steady on band. Hold momentarily. Return slowly, keeping pull steady, back to start. And then again with a wider grip. Repeat _10__ times. Band color __red____   Side Pull: Double Arm   On back, knees bent, feet flat. Arms perpendicular to body, shoulder level, elbows straight but relaxed. Pull arms out to sides, elbows straight. Resistance band comes across collarbones, hands toward floor. Hold momentarily. Slowly return to starting position. Repeat _10__ times. Band color _red____   Sword   On back, knees bent, feet flat, left hand on left hip, right hand above left. Pull right arm DIAGONALLY (hip to shoulder) across chest. Bring right arm along head toward floor. Hold momentarily. Slowly return to starting position. Repeat _10__ times. Do with left arm. Band color __red____   Shoulder Rotation: Double Arm   On back, knees bent, feet flat, elbows tucked at sides, bent 90, hands palms up. Pull hands apart and down toward floor, keeping elbows near sides. Hold momentarily. Slowly return to starting position. Repeat _10__ times. Band color __red____

## 2016-05-12 ENCOUNTER — Ambulatory Visit: Payer: 59

## 2016-05-12 DIAGNOSIS — Z9189 Other specified personal risk factors, not elsewhere classified: Secondary | ICD-10-CM

## 2016-05-12 DIAGNOSIS — R1310 Dysphagia, unspecified: Secondary | ICD-10-CM | POA: Diagnosis not present

## 2016-05-12 DIAGNOSIS — I890 Lymphedema, not elsewhere classified: Secondary | ICD-10-CM

## 2016-05-12 DIAGNOSIS — M546 Pain in thoracic spine: Secondary | ICD-10-CM

## 2016-05-12 DIAGNOSIS — M2560 Stiffness of unspecified joint, not elsewhere classified: Secondary | ICD-10-CM | POA: Diagnosis not present

## 2016-05-12 DIAGNOSIS — M6281 Muscle weakness (generalized): Secondary | ICD-10-CM

## 2016-05-12 NOTE — Therapy (Signed)
spine  Lymphedema, not elsewhere classified  Muscle weakness (generalized)  At risk for lymphedema  Joint stiffness of spine     Problem List Patient Active Problem List   Diagnosis Date Noted  . S/P gastrostomy ([place]) 03/16/2016  . Malignant neoplasm of lower lobe of left lung ([place]) 02/17/2016  . Malnutrition of moderate degree 01/04/2016  . Goals of care, counseling/discussion   . Dysphagia 12/31/2015  . 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 Encounter for palliative care   . Odynophagia   . Throat pain in adult 12/29/2015  . Nausea without vomiting 12/16/2015  . Anemia in chronic illness 12/16/2015  . Anemia due to other cause   . Dehydration   . Severe protein-calorie malnutrition ([place]) 11/14/2015  . Intractable nausea and vomiting 11/13/2015  . Gastritis, acute 11/13/2015  . Constipation due to opioid therapy 11/10/2015  . Cancer of base of tongue ([place]) 10/14/2015  . Skin lesion of back 03/21/2013  . Lipoma of axilla 03/21/2013  . Sleep disorder 03/17/2013  . Skin lesion 03/17/2013  . Health care maintenance 03/17/2013  . Right knee pain 04/05/2012  . Health maintenance examination 10/17/2011  . Thalassemia 10/17/2011  . Essential hypertension 10/17/2011  . Hyperlipidemia 10/17/2011  . Headache(784.0)  10/17/2011    Debra Farrell 05/12/2016, 11:08 AM  [place], [place], [address] Phone: [phone]   Fax:  [phone]  Name: Debra Farrell MRN: [id number] Date of Birth: 24-Oct-1945  [place], [place], [address] Phone: [phone]   Fax:  [phone]  Physical Therapy Treatment  Patient Details  Name: Debra Farrell MRN: [id number] Date of Birth: 1946/03/25 Referring Provider: Dr. [name]  Encounter Date: 05/12/2016      PT End of Session - 05/12/16 1102    Visit Number 4   Number of Visits 9   Date for PT Re-Evaluation 06/03/16   PT Start Time 1017   PT Stop Time 1101   PT Time Calculation (min) 44 min   Activity Tolerance Patient tolerated treatment well   Behavior During Therapy [facility] for tasks assessed/performed      Past Medical History:  Diagnosis Date  . Abdominal abscess ([place]) 11/30/2015  . Anemia in chronic illness 12/16/2015  . Cancer of base of tongue ([place]) 10/14/2015  . Eczema   . Hx of radiation therapy completed 01/05/16   Base of Tongue  . Hx of radiation therapy    Left Lower Lung  . Hyperlipidemia   . Hypertension   . Infection with methicillin-resistant Staphylococcus aureus (MRSA) 11/30/2015  . Intractable nausea and vomiting 11/13/2015  . Laceration 04/2014   around R ear, for falling out of bed & hitting nightstand  . Nausea without vomiting 12/16/2015  . Thalassemia minor   . Throat pain in adult 12/29/2015    Past Surgical History:  Procedure Laterality Date  . ABDOMINAL HYSTERECTOMY  1990   partial  . DIRECT LARYNGOSCOPY N/A 10/28/2015   Procedure: DIRECT LARYNGOSCOPY WITH BIOPSY;  Surgeon: [name], MD;  Location: [place];  Service: ENT;  Laterality: N/A;  . ESOPHAGOSCOPY N/A 10/28/2015   Procedure: ESOPHAGOSCOPY;  Surgeon: [name], MD;  Location: [place];  Service: ENT;  Laterality: N/A;  . [place], [id number]  . LARYNGOSCOPY AND BRONCHOSCOPY N/A 10/28/2015   Procedure: BRONCHOSCOPY;  Surgeon: [name], MD;  Location: [place];  Service: ENT;  Laterality: N/A;  . MULTIPLE EXTRACTIONS WITH ALVEOLOPLASTY N/A 10/28/2015   Procedure: Extraction of tooth #'s 2-12, 14,15,17,18,20-29, 31 with alveoloplasy and mandibular left torus reduction;  Surgeon: Lenn Cal, DDS;  Location: [place];  Service: Oral Surgery;  Laterality: N/A;  . RECTOCELE REPAIR    . TONSILLECTOMY     as a child  . urocele     correction surgery    There were no vitals filed for this visit.      Subjective Assessment - 05/12/16 1021    Subjective I tried the tennis ball in a sock over my shoulder to work on my trigger points against the wall since last visit and it hurt but I could tell it was beneficial. Overall my pain has improved where I don't feel it all the time now, only when I feel pressure at that area (medial scapular border).    Pertinent History Pt. with right neck mass x 6 months.  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  She completed XRT for neck in May and then was found to have lung nodules (metastases) and was treated with 9 SBRT sessions, sompleted 03/07/16.   HTN.   Patient Stated Goals get help with pain, fatigue   Currently in Pain? No/denies                         [facility] Adult PT Treatment/Exercise - 05/12/16 0001  [place], [place], [address] Phone: [phone]   Fax:  [phone]  Physical Therapy Treatment  Patient Details  Name: Debra Farrell MRN: [id number] Date of Birth: 1946/03/25 Referring Provider: Dr. [name]  Encounter Date: 05/12/2016      PT End of Session - 05/12/16 1102    Visit Number 4   Number of Visits 9   Date for PT Re-Evaluation 06/03/16   PT Start Time 1017   PT Stop Time 1101   PT Time Calculation (min) 44 min   Activity Tolerance Patient tolerated treatment well   Behavior During Therapy [facility] for tasks assessed/performed      Past Medical History:  Diagnosis Date  . Abdominal abscess ([place]) 11/30/2015  . Anemia in chronic illness 12/16/2015  . Cancer of base of tongue ([place]) 10/14/2015  . Eczema   . Hx of radiation therapy completed 01/05/16   Base of Tongue  . Hx of radiation therapy    Left Lower Lung  . Hyperlipidemia   . Hypertension   . Infection with methicillin-resistant Staphylococcus aureus (MRSA) 11/30/2015  . Intractable nausea and vomiting 11/13/2015  . Laceration 04/2014   around R ear, for falling out of bed & hitting nightstand  . Nausea without vomiting 12/16/2015  . Thalassemia minor   . Throat pain in adult 12/29/2015    Past Surgical History:  Procedure Laterality Date  . ABDOMINAL HYSTERECTOMY  1990   partial  . DIRECT LARYNGOSCOPY N/A 10/28/2015   Procedure: DIRECT LARYNGOSCOPY WITH BIOPSY;  Surgeon: [name], MD;  Location: [facility];  Service: ENT;  Laterality: N/A;  . ESOPHAGOSCOPY N/A 10/28/2015   Procedure: ESOPHAGOSCOPY;  Surgeon: [name], MD;  Location: [facility];  Service: ENT;  Laterality: N/A;  . [place], [id number]  . LARYNGOSCOPY AND BRONCHOSCOPY N/A 10/28/2015   Procedure: BRONCHOSCOPY;  Surgeon: [name], MD;  Location: [place];  Service: ENT;  Laterality: N/A;  . MULTIPLE EXTRACTIONS WITH ALVEOLOPLASTY N/A 10/28/2015   Procedure: Extraction of tooth #'s 2-12, 14,15,17,18,20-29, 31 with alveoloplasy and mandibular left torus reduction;  Surgeon: Lenn Cal, DDS;  Location: [place];  Service: Oral Surgery;  Laterality: N/A;  . RECTOCELE REPAIR    . TONSILLECTOMY     as a child  . urocele     correction surgery    There were no vitals filed for this visit.      Subjective Assessment - 05/12/16 1021    Subjective I tried the tennis ball in a sock over my shoulder to work on my trigger points against the wall since last visit and it hurt but I could tell it was beneficial. Overall my pain has improved where I don't feel it all the time now, only when I feel pressure at that area (medial scapular border).    Pertinent History Pt. with right neck mass x 6 months.  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  She completed XRT for neck in May and then was found to have lung nodules (metastases) and was treated with 9 SBRT sessions, sompleted 03/07/16.   HTN.   Patient Stated Goals get help with pain, fatigue   Currently in Pain? No/denies                         [facility] Adult PT Treatment/Exercise - 05/12/16 0001

## 2016-05-13 ENCOUNTER — Telehealth: Payer: Self-pay | Admitting: Hematology and Oncology

## 2016-05-13 ENCOUNTER — Ambulatory Visit (HOSPITAL_BASED_OUTPATIENT_CLINIC_OR_DEPARTMENT_OTHER): Payer: 59 | Admitting: Hematology and Oncology

## 2016-05-13 ENCOUNTER — Ambulatory Visit (HOSPITAL_BASED_OUTPATIENT_CLINIC_OR_DEPARTMENT_OTHER): Payer: 59

## 2016-05-13 ENCOUNTER — Other Ambulatory Visit (HOSPITAL_BASED_OUTPATIENT_CLINIC_OR_DEPARTMENT_OTHER): Payer: 59

## 2016-05-13 ENCOUNTER — Encounter: Payer: Self-pay | Admitting: Hematology and Oncology

## 2016-05-13 DIAGNOSIS — C01 Malignant neoplasm of base of tongue: Secondary | ICD-10-CM | POA: Diagnosis not present

## 2016-05-13 DIAGNOSIS — D638 Anemia in other chronic diseases classified elsewhere: Secondary | ICD-10-CM | POA: Diagnosis not present

## 2016-05-13 DIAGNOSIS — Z931 Gastrostomy status: Secondary | ICD-10-CM

## 2016-05-13 DIAGNOSIS — I890 Lymphedema, not elsewhere classified: Secondary | ICD-10-CM

## 2016-05-13 DIAGNOSIS — D6489 Other specified anemias: Secondary | ICD-10-CM

## 2016-05-13 DIAGNOSIS — R070 Pain in throat: Secondary | ICD-10-CM | POA: Diagnosis not present

## 2016-05-13 DIAGNOSIS — E43 Unspecified severe protein-calorie malnutrition: Secondary | ICD-10-CM | POA: Diagnosis not present

## 2016-05-13 DIAGNOSIS — R1310 Dysphagia, unspecified: Secondary | ICD-10-CM

## 2016-05-13 DIAGNOSIS — A4902 Methicillin resistant Staphylococcus aureus infection, unspecified site: Secondary | ICD-10-CM

## 2016-05-13 DIAGNOSIS — IMO0002 Reserved for concepts with insufficient information to code with codable children: Secondary | ICD-10-CM

## 2016-05-13 LAB — COMPREHENSIVE METABOLIC PANEL
ALT: 22 U/L (ref 0–55)
ANION GAP: 6 meq/L (ref 3–11)
AST: 27 U/L (ref 5–34)
Albumin: 3.7 g/dL (ref 3.5–5.0)
Alkaline Phosphatase: 74 U/L (ref 40–150)
BUN: 36.9 mg/dL — ABNORMAL HIGH (ref 7.0–26.0)
CHLORIDE: 102 meq/L (ref 98–109)
CO2: 29 meq/L (ref 22–29)
CREATININE: 1 mg/dL (ref 0.6–1.1)
Calcium: 9.8 mg/dL (ref 8.4–10.4)
EGFR: 55 mL/min/{1.73_m2} — AB (ref >90)
GLUCOSE: 87 mg/dL (ref 70–140)
Potassium: 4.2 mEq/L (ref 3.5–5.1)
SODIUM: 138 meq/L (ref 136–145)
Total Bilirubin: 0.43 mg/dL (ref 0.20–1.20)
Total Protein: 6.8 g/dL (ref 6.4–8.3)

## 2016-05-13 LAB — CBC WITH DIFFERENTIAL/PLATELET
BASO%: 0.7 % (ref 0.0–2.0)
Basophils Absolute: 0 10*3/uL (ref 0.0–0.1)
EOS ABS: 0.1 10*3/uL (ref 0.0–0.5)
EOS%: 1.5 % (ref 0.0–7.0)
HEMATOCRIT: 27 % — AB (ref 34.8–46.6)
HGB: 8.7 g/dL — ABNORMAL LOW (ref 11.6–15.9)
LYMPH%: 13.5 % — ABNORMAL LOW (ref 14.0–49.7)
MCH: 21.3 pg — ABNORMAL LOW (ref 25.1–34.0)
MCHC: 32.2 g/dL (ref 31.5–36.0)
MCV: 66 fL — ABNORMAL LOW (ref 79.5–101.0)
MONO#: 0.8 10*3/uL (ref 0.1–0.9)
MONO%: 19 % — ABNORMAL HIGH (ref 0.0–14.0)
NEUT%: 65.3 % (ref 38.4–76.8)
NEUTROS ABS: 2.6 10*3/uL (ref 1.5–6.5)
NRBC: 0 % (ref 0–0)
PLATELETS: 236 10*3/uL (ref 145–400)
RBC: 4.09 10*6/uL (ref 3.70–5.45)
RDW: 14.7 % — AB (ref 11.2–14.5)
WBC: 4 10*3/uL (ref 3.9–10.3)
lymph#: 0.5 10*3/uL — ABNORMAL LOW (ref 0.9–3.3)

## 2016-05-13 LAB — MAGNESIUM: Magnesium: 2.6 mg/dl — ABNORMAL HIGH (ref 1.5–2.5)

## 2016-05-13 MED ORDER — SODIUM CHLORIDE 0.9 % IJ SOLN
10.0000 mL | INTRAMUSCULAR | Status: DC | PRN
  Administered 2016-05-13: 10 mL via INTRAVENOUS
  Filled 2016-05-13: qty 10

## 2016-05-13 MED ORDER — HEPARIN SOD (PORK) LOCK FLUSH 100 UNIT/ML IV SOLN
500.0000 [IU] | Freq: Once | INTRAVENOUS | Status: AC | PRN
  Administered 2016-05-13: 500 [IU] via INTRAVENOUS
  Filled 2016-05-13: qty 5

## 2016-05-13 MED ORDER — HYDROMORPHONE HCL 4 MG PO TABS: 4.0000 mg | ORAL_TABLET | ORAL | 0 refills | Status: DC | PRN

## 2016-05-13 MED FILL — HYDROmorphone HCL 4 MG TABS: 4 | 15 days supply | Qty: 90 | Fill #0

## 2016-05-13 NOTE — Assessment & Plan Note (Signed)
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 The patient denies recent history of bleeding such as epistaxis, hematuria or hematochezia. She is asymptomatic from the anemia. We will observe for now.  She does not require transfusion now.

## 2016-05-13 NOTE — Patient Instructions (Signed)

## 2016-05-13 NOTE — Assessment & Plan Note (Signed)
She appears to be tolerating radiation therapy well. Continue the same.  I would defer to the radiation oncologist to order follow-up imaging study. I plan to see her back in 4 months to provide further supportive care.

## 2016-05-13 NOTE — Assessment & Plan Note (Signed)
She is able to tolerate her nutritional supplement without nausea and vomiting. Serum albumin is improving and she is gaining weight We will continue to monitor her weight on a regular basis.

## 2016-05-13 NOTE — Assessment & Plan Note (Signed)
She has no further complication related to feeding tube.

## 2016-05-13 NOTE — Assessment & Plan Note (Signed)
She has acquired lymphedema. She is participating in physical therapy.

## 2016-05-13 NOTE — Progress Notes (Signed)
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 3 cm right tongue base mass consistent with malignancy.2. Necrotic right level II and III nodal metastases.3. Enlarged right thyroid lobe containing multiple nodules.      10/14/2015 Imaging    PET scan showed 1. Hypermetabolic right tongue base mass with hypermetabolic right level II and III adenopathy. Small left level III lymph node appears mildly hypermetabolic as well.2. A few scattered tiny pulmonary nodules are too small for PETresolution      10/16/2015 Imaging    [place] Head/Neck:  1) Multiple thyroid lesions bilaterally; dominant nodule in right lower pole measures 22 mm and meets fine-needle aspiration criteria. 2) Complex mass within the soft tissues of the right side of neck worrisome for metastatic malignancy.      10/26/2015 Pathology Results    Accession [id number]:  Thyroid FNA - Consistent with benign follicular nodule.      10/28/2015 Surgery    Multiple tooth extractions (26), right tongue base biopsy.      10/28/2015 Pathology Results    Accession [id number]:  Tongue, right base -  Infiltrative SCC, p16 positive.   Actinomyces infection.      11/05/2015 Procedure    Port-a-cath and PEG placed.      11/11/2015 - 01/05/2016 Radiation Therapy    She received radiation      11/11/2015 - 11/12/2015 Chemotherapy    She received high dose cisplatin. Treatment was discontinued permanently due to severe side-effects      11/13/2015 Adverse Reaction    She presented with intractable nausea and vomiting      11/13/2015 - 11/30/2015 Hospital Admission    She was hospitalized for intractable nausea, vomiting and developed acute renal failure, reversed with aggressive IV fluid hydration. Subsequently, she was found to have intra-abdominal infection with MRSA requiring placement of drainage tube and IV ABx      11/24/2015 Imaging    CT scan showed large abdominal abscess      11/25/2015 Procedure    She had placement of drain      12/08/2015 Procedure    Intra-abdominal drain was removed      12/08/2015 Imaging    CT abdomen showed resolution of abscess and a new left lower base lung nodules      12/31/2015 Imaging    Enlarging left lower lobe pulmonary nodule. This could represent a hematogenous metastasis although the rate of enlargement is faster than typical.      02/16/2016 Imaging    Pulmonary nodule in the left lower lobe has increased in size from 12/08/2015. This is a  new finding when compared with 10/13/2014. Findings are worrisome for progression of pulmonary metastasis      03/14/2016 -  Radiation Therapy    She has SBRT to lung lesion       INTERVAL HISTORY: Please see below for problem oriented charting. She returns for follow-up. She denies chest pain or cough. She continued to have dysgeusia. She has gained some weight but is 100% feeding tube dependent. She is able to swallow some liquids but not solids or pills Her throat pain has almost completely resolved. She uses Dilaudid daily for very minor pain  REVIEW OF SYSTEMS:   Constitutional: Denies fevers, chills or abnormal weight  loss Eyes: Denies blurriness of vision Respiratory: Denies cough, dyspnea or wheezes Cardiovascular: Denies palpitation, chest discomfort or lower extremity swelling Gastrointestinal:  Denies nausea, heartburn or change in bowel habits Skin: Denies abnormal skin rashes Lymphatics: Denies new lymphadenopathy or easy bruising Neurological:Denies numbness, tingling or new weaknesses Behavioral/Psych: Mood is stable, no new changes  All other systems were reviewed with the patient and are negative.  I have reviewed the past medical history, past surgical history, social history and family history with the patient and they are unchanged from previous note.  ALLERGIES:  is allergic to morphine and related.  MEDICATIONS:  Current Outpatient Prescriptions  Medication Sig Dispense Refill  . Amino Acids-Protein Hydrolys (FEEDING SUPPLEMENT, PRO-STAT SUGAR FREE 64,) LIQD Place 30 mLs into feeding tube daily. 900 mL 0  . doxepin (SINEQUAN) 25 MG capsule Take 1 capsule (25 mg total) by mouth every 6 (six) hours. 30 capsule 2  . fentaNYL (DURAGESIC - DOSED MCG/HR) 12 MCG/HR Place 1 patch (12.5 mcg total) onto the skin every 3 (three) days. To be added to the other prescription for total 37 mcg 10 patch 0  . fluconazole (DIFLUCAN) 100 MG tablet Take 2 tablets today, then 1 tablet daily x 20 more days. 22 tablet 0  . HYDROmorphone (DILAUDID) 4 MG tablet Take 1 tablet (4 mg total) by mouth every 4 (four) hours as needed for severe pain. 90 tablet 0  . Nutritional Supplements (FEEDING SUPPLEMENT, OSMOLITE 1.5 CAL,) LIQD Change Osmolite 1.5 or equivalent to bolus feedings of 7 cans daily. Give 2 cans TID and one can once daily. Give 120 cc free water before and after bolus feedings. Continue 45 mL Promod once daily. Give additional 500 cc free water daily via tube or by mouth as tolerated. 1659 mL 0  . ondansetron (ZOFRAN) 8 MG tablet TAKE 1 TABLET BY MOUTH 2 TIMES DAILY AS NEEDED. START ON THE THIRD DAY AFTER  CHEMOTHERAPY. 30 tablet 1  . Water For Irrigation, Sterile (FREE WATER) SOLN Place 100 mLs into feeding tube 4 (four) times daily.     No current facility-administered medications for this visit.     PHYSICAL EXAMINATION: ECOG PERFORMANCE STATUS: 1 - Symptomatic but completely ambulatory  Vitals:   05/13/16 1218  BP: (!) 141/74  Pulse: 90  Resp: 18  Temp: 98 F (36.7 C)   Filed Weights   05/13/16 1218  Weight: 151 lb 4.8 oz (68.6 kg)    GENERAL:alert, no distress and comfortable SKIN: skin color is pale, texture, turgor are normal, no rashes or significant lesions EYES: normal, Conjunctiva are pink and non-injected, sclera clear OROPHARYNX:no exudate, no erythema and lips, buccal mucosa, and tongue normal  NECK: Noted significant lymphedema around her neck  LYMPH:  no palpable lymphadenopathy in the cervical, axillary or inguinal LUNGS: clear to auscultation and  percussion with normal breathing effort HEART: regular rate & rhythm and no murmurs and no lower extremity edema ABDOMEN:abdomen soft, non-tender and normal bowel sounds. Feeding tube site looks okay Musculoskeletal:no cyanosis of digits and no clubbing  NEURO: alert & oriented x 3 with fluent speech, no focal motor/sensory deficits  LABORATORY DATA:  I have reviewed the data as listed    Component Value Date/Time   NA 137 03/18/2016 0941   K 4.4 03/18/2016 0941   CL 94 (L) 01/05/2016 0445   CO2 29 03/18/2016 0941   GLUCOSE 93 03/18/2016 0941   BUN 30.4 (H) 03/18/2016 0941   CREATININE 0.9 03/18/2016 0941   CALCIUM 9.7 03/18/2016 0941   PROT 6.8 03/18/2016 0941   ALBUMIN 3.6 03/18/2016 0941   AST 22 03/18/2016 0941   ALT 21 03/18/2016 0941   ALKPHOS 74 03/18/2016 0941   BILITOT 0.42 03/18/2016 0941   GFRNONAA >60 01/05/2016 0445   GFRAA >60 01/05/2016 0445    No results found for: SPEP, UPEP  Lab Results  Component Value Date   WBC 4.0 05/13/2016   NEUTROABS 2.6 05/13/2016   HGB 8.7 (L) 05/13/2016    HCT 27.0 (L) 05/13/2016   MCV 66.0 (L) 05/13/2016   PLT 236 05/13/2016      Chemistry      Component Value Date/Time   NA 137 03/18/2016 0941   K 4.4 03/18/2016 0941   CL 94 (L) 01/05/2016 0445   CO2 29 03/18/2016 0941   BUN 30.4 (H) 03/18/2016 0941   CREATININE 0.9 03/18/2016 0941      Component Value Date/Time   CALCIUM 9.7 03/18/2016 0941   ALKPHOS 74 03/18/2016 0941   AST 22 03/18/2016 0941   ALT 21 03/18/2016 0941   BILITOT 0.42 03/18/2016 0941    ASSESSMENT & PLAN:  Cancer of base of tongue ([place]) She appears to be tolerating radiation therapy well. Continue the same.  I would defer to the radiation oncologist to order follow-up imaging study. I plan to see her back in 4 months to provide further supportive care.  Severe protein-calorie malnutrition ([place]) She is able to tolerate her nutritional supplement without nausea and vomiting. Serum albumin is improving and she is gaining weight We will continue to monitor her weight on a regular basis.  Anemia due to other cause This is likely anemia of chronic disease with background of thalassemia. The patient denies recent history of bleeding such as epistaxis, hematuria or hematochezia. She is asymptomatic from the anemia. We will observe for now.  She does not require transfusion now.  Throat pain in adult Pain control is improving. I refill prescription Dilaudid. She has not used fentanyl patch for over 2 weeks  S/P gastrostomy ([place]) She has no further complication related to feeding tube.  Dysphagia She has significant dysphagia. She is able to swallow liquids without aspiration but is not able to eat any food. I will get assistance from speech and language therapist to see if modified barium swallow is appropriate for further assessment of dysphagia.  She would like to eat everything by mouth in the future and get rid of the feeding tube  Lymphedema in adult patient She has acquired lymphedema. She is  participating in physical therapy.   No orders of the defined types were placed in this encounter.  All questions were answered. The patient knows to call the clinic with any problems, questions or concerns. No barriers to learning was detected. I spent 25 minutes counseling the  patient face to face. The total time spent in the appointment was 30 minutes and more than 50% was on counseling and review of test results     [facility], [place], MD 05/13/2016 12:48 PM

## 2016-05-13 NOTE — Telephone Encounter (Signed)
Gave pt cal & avs °

## 2016-05-13 NOTE — Assessment & Plan Note (Signed)
She has significant dysphagia. She is able to swallow liquids without aspiration but is not able to eat any food. I will get assistance from speech and language therapist to see if modified barium swallow is appropriate for further assessment of dysphagia.  She would like to eat everything by mouth in the future and get rid of the feeding tube

## 2016-05-13 NOTE — Assessment & Plan Note (Signed)
Pain control is improving. I refill prescription Dilaudid. She has not used fentanyl patch for over 2 weeks

## 2016-05-17 ENCOUNTER — Ambulatory Visit: Payer: 59

## 2016-05-17 DIAGNOSIS — M546 Pain in thoracic spine: Secondary | ICD-10-CM

## 2016-05-17 DIAGNOSIS — M6281 Muscle weakness (generalized): Secondary | ICD-10-CM

## 2016-05-17 DIAGNOSIS — Z9189 Other specified personal risk factors, not elsewhere classified: Secondary | ICD-10-CM

## 2016-05-17 DIAGNOSIS — M2560 Stiffness of unspecified joint, not elsewhere classified: Secondary | ICD-10-CM | POA: Diagnosis not present

## 2016-05-17 DIAGNOSIS — R1310 Dysphagia, unspecified: Secondary | ICD-10-CM | POA: Diagnosis not present

## 2016-05-17 DIAGNOSIS — I890 Lymphedema, not elsewhere classified: Secondary | ICD-10-CM

## 2016-05-17 NOTE — Therapy (Signed)
in prone and Lt S/L, then progressed scapular series to standing leaning against foam roll on wall which pt reported "feeling the muscles working alot harder" in this position as opposed to supine. Instructed her she can mimick this with a rolled towel at home and she verbalized understanding this.  Pt progressing overall very well.    Rehab Potential Excellent   PT Frequency 2x / week   PT Duration 4 weeks   PT Treatment/Interventions ADLs/Self Care Home Management;Therapeutic exercise;Patient/family education;Manual techniques;Manual lymph drainage;Compression bandaging;Passive range of motion;Electrical Stimulation;Cryotherapy   PT Next Visit Plan Assess goals next visit. Begin manual lymph drainage next visit after assessing skin integrity. Cont HEP progression for postural strength prn; modalities for pain prn; check neck muscle strength and include neck strengthening in HEP. Consider spray and stretch for trigger points.   Consulted and Agree with Plan of Care Patient      Patient will benefit from skilled therapeutic intervention in order to improve the following deficits and impairments:  Decreased activity tolerance, Decreased knowledge of use of DME, Decreased range of motion, Decreased strength, Increased edema, Pain  Visit Diagnosis: Lymphedema, not  elsewhere classified  Pain in thoracic spine  Muscle weakness (generalized)  At risk for lymphedema     Problem List Patient Active Problem List   Diagnosis Date Noted  . Lymphedema in adult patient 05/13/2016  . S/P gastrostomy ([place]) 03/16/2016  . Malignant neoplasm of lower lobe of left lung ([place]) 02/17/2016  . Malnutrition of moderate degree 01/04/2016  . Goals of care, counseling/discussion   . Dysphagia 12/31/2015  . 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 Encounter for palliative care   . Odynophagia   . Throat pain in adult 12/29/2015  . Nausea without vomiting 12/16/2015  . Anemia in chronic illness 12/16/2015  . Anemia due to other cause   . Dehydration   . Severe protein-calorie malnutrition ([place]) 11/14/2015  . Intractable nausea and vomiting 11/13/2015  . Gastritis, acute 11/13/2015  . Constipation due to opioid therapy 11/10/2015  . Cancer of base of tongue ([place]) 10/14/2015  . Skin lesion of back 03/21/2013  . Lipoma of axilla 03/21/2013  . Sleep disorder 03/17/2013  . Skin lesion 03/17/2013  . Health care maintenance 03/17/2013  . Right knee pain 04/05/2012  . Health maintenance examination 10/17/2011  . Thalassemia 10/17/2011  . Essential hypertension 10/17/2011  . Hyperlipidemia 10/17/2011  . Headache(784.0) 10/17/2011    Otelia Limes, PTA 05/17/2016, 11:08 AM  [place], [place], [address] Phone: [phone]   Fax:  [phone]  Name: Debra Farrell MRN: [id number] Date of Birth: Dec 04, 1945  [place], [place], [address] Phone: [phone]   Fax:  [phone]  Physical Therapy Treatment  Patient Details  Name: Debra Farrell MRN: [id number] Date of Birth: 06/27/1946 Referring Provider: Dr. [name]  Encounter Date: 05/17/2016      PT End of Session - 05/17/16 1101    Visit Number 5   Number of Visits 9   Date for PT Re-Evaluation 06/03/16   PT Start Time 1019   PT Stop Time 1102   PT Time Calculation (min) 43 min   Activity Tolerance Patient tolerated treatment well   Behavior During Therapy [facility] for tasks assessed/performed      Past Medical History:  Diagnosis Date  . Abdominal abscess ([place]) 11/30/2015  . Anemia in chronic illness 12/16/2015  . Cancer of base of tongue ([place]) 10/14/2015  . Eczema   . Hx of radiation therapy completed 01/05/16   Base of Tongue  . Hx of radiation therapy    Left Lower Lung  . Hyperlipidemia   . Hypertension   . Infection with methicillin-resistant Staphylococcus aureus (MRSA) 11/30/2015  . Intractable nausea and vomiting 11/13/2015  . Laceration 04/2014   around R ear, for falling out of bed & hitting nightstand  . Nausea without vomiting 12/16/2015  . Thalassemia minor   . Throat pain in adult 12/29/2015    Past Surgical History:  Procedure Laterality Date  . ABDOMINAL HYSTERECTOMY  1990   partial  . DIRECT LARYNGOSCOPY N/A 10/28/2015   Procedure: DIRECT LARYNGOSCOPY WITH BIOPSY;  Surgeon: [name], MD;  Location: [facility];  Service: ENT;  Laterality: N/A;  . ESOPHAGOSCOPY N/A 10/28/2015   Procedure: ESOPHAGOSCOPY;  Surgeon: [name], MD;  Location: [facility];  Service: ENT;  Laterality: N/A;  . [place], [id number]  . LARYNGOSCOPY AND BRONCHOSCOPY N/A 10/28/2015   Procedure: BRONCHOSCOPY;  Surgeon: [name], MD;  Location: [facility];  Service: ENT;  Laterality: N/A;  . MULTIPLE EXTRACTIONS WITH ALVEOLOPLASTY N/A 10/28/2015   Procedure: Extraction of tooth #'s 2-12, 14,15,17,18,20-29, 31 with alveoloplasy and mandibular left torus reduction;  Surgeon: Lenn Cal, DDS;  Location: [facility];  Service: Oral Surgery;  Laterality: N/A;  . RECTOCELE REPAIR    . TONSILLECTOMY     as a child  . urocele     correction surgery    There were no vitals filed for this visit.      Subjective Assessment - 05/17/16 1023    Subjective My pain is so much better at my Rt chest and Rt scapula. The trigger point area at my Rt shoulder blade area felt alot better after last session and still does. I felt really bad over the weekend with feeling nausous, was constipated and then had diarrhea; felt great yesterday but then started feeling a little bad again today so I'm not sure what's going on.     Pertinent History Pt. with right neck mass x 6 months.  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  She completed XRT for neck in May and then was found to have lung nodules (metastases) and was treated with 9 SBRT sessions, sompleted 03/07/16.   HTN.   Patient Stated Goals get help with pain, fatigue   Currently in Pain? No/denies  [place], [place], [address] Phone: [phone]   Fax:  [phone]  Physical Therapy Treatment  Patient Details  Name: Debra Farrell MRN: [id number] Date of Birth: 06/27/1946 Referring Provider: Dr. [name]  Encounter Date: 05/17/2016      PT End of Session - 05/17/16 1101    Visit Number 5   Number of Visits 9   Date for PT Re-Evaluation 06/03/16   PT Start Time 1019   PT Stop Time 1102   PT Time Calculation (min) 43 min   Activity Tolerance Patient tolerated treatment well   Behavior During Therapy [facility] for tasks assessed/performed      Past Medical History:  Diagnosis Date  . Abdominal abscess ([place]) 11/30/2015  . Anemia in chronic illness 12/16/2015  . Cancer of base of tongue ([place]) 10/14/2015  . Eczema   . Hx of radiation therapy completed 01/05/16   Base of Tongue  . Hx of radiation therapy    Left Lower Lung  . Hyperlipidemia   . Hypertension   . Infection with methicillin-resistant Staphylococcus aureus (MRSA) 11/30/2015  . Intractable nausea and vomiting 11/13/2015  . Laceration 04/2014   around R ear, for falling out of bed & hitting nightstand  . Nausea without vomiting 12/16/2015  . Thalassemia minor   . Throat pain in adult 12/29/2015    Past Surgical History:  Procedure Laterality Date  . ABDOMINAL HYSTERECTOMY  1990   partial  . DIRECT LARYNGOSCOPY N/A 10/28/2015   Procedure: DIRECT LARYNGOSCOPY WITH BIOPSY;  Surgeon: [name], MD;  Location: [facility];  Service: ENT;  Laterality: N/A;  . ESOPHAGOSCOPY N/A 10/28/2015   Procedure: ESOPHAGOSCOPY;  Surgeon: [name], MD;  Location: [facility];  Service: ENT;  Laterality: N/A;  . [place], [id number]  . LARYNGOSCOPY AND BRONCHOSCOPY N/A 10/28/2015   Procedure: BRONCHOSCOPY;  Surgeon: [name], MD;  Location: [facility];  Service: ENT;  Laterality: N/A;  . MULTIPLE EXTRACTIONS WITH ALVEOLOPLASTY N/A 10/28/2015   Procedure: Extraction of tooth #'s 2-12, 14,15,17,18,20-29, 31 with alveoloplasy and mandibular left torus reduction;  Surgeon: Lenn Cal, DDS;  Location: [facility];  Service: Oral Surgery;  Laterality: N/A;  . RECTOCELE REPAIR    . TONSILLECTOMY     as a child  . urocele     correction surgery    There were no vitals filed for this visit.      Subjective Assessment - 05/17/16 1023    Subjective My pain is so much better at my Rt chest and Rt scapula. The trigger point area at my Rt shoulder blade area felt alot better after last session and still does. I felt really bad over the weekend with feeling nausous, was constipated and then had diarrhea; felt great yesterday but then started feeling a little bad again today so I'm not sure what's going on.     Pertinent History Pt. with right neck mass x 6 months.  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  She completed XRT for neck in May and then was found to have lung nodules (metastases) and was treated with 9 SBRT sessions, sompleted 03/07/16.   HTN.   Patient Stated Goals get help with pain, fatigue   Currently in Pain? No/denies

## 2016-05-19 ENCOUNTER — Ambulatory Visit: Payer: 59

## 2016-05-19 DIAGNOSIS — I890 Lymphedema, not elsewhere classified: Secondary | ICD-10-CM | POA: Diagnosis not present

## 2016-05-19 DIAGNOSIS — Z9189 Other specified personal risk factors, not elsewhere classified: Secondary | ICD-10-CM | POA: Diagnosis not present

## 2016-05-19 DIAGNOSIS — M546 Pain in thoracic spine: Secondary | ICD-10-CM

## 2016-05-19 DIAGNOSIS — M2560 Stiffness of unspecified joint, not elsewhere classified: Secondary | ICD-10-CM | POA: Diagnosis not present

## 2016-05-19 DIAGNOSIS — R1310 Dysphagia, unspecified: Secondary | ICD-10-CM | POA: Diagnosis not present

## 2016-05-19 DIAGNOSIS — M6281 Muscle weakness (generalized): Secondary | ICD-10-CM | POA: Diagnosis not present

## 2016-05-19 NOTE — Therapy (Signed)
[place], [place], [address] Phone: [phone]   Fax:  [phone]  Physical Therapy Treatment  Patient Details  Name: Debra Farrell MRN: [id number] Date of Birth: 01-Jul-1946 Referring Provider: Dr. [name]  Encounter Date: 05/19/2016      PT End of Session - 05/19/16 1210    Visit Number 6   Number of Visits 9   Date for PT Re-Evaluation 06/03/16   PT Start Time 1025   PT Stop Time 1103   PT Time Calculation (min) 38 min   Activity Tolerance Patient tolerated treatment well   Behavior During Therapy [facility] for tasks assessed/performed      Past Medical History:  Diagnosis Date  . Abdominal abscess ([place]) 11/30/2015  . Anemia in chronic illness 12/16/2015  . Cancer of base of tongue ([place]) 10/14/2015  . Eczema   . Hx of radiation therapy completed 01/05/16   Base of Tongue  . Hx of radiation therapy    Left Lower Lung  . Hyperlipidemia   . Hypertension   . Infection with methicillin-resistant Staphylococcus aureus (MRSA) 11/30/2015  . Intractable nausea and vomiting 11/13/2015  . Laceration 04/2014   around R ear, for falling out of bed & hitting nightstand  . Nausea without vomiting 12/16/2015  . Thalassemia minor   . Throat pain in adult 12/29/2015    Past Surgical History:  Procedure Laterality Date  . ABDOMINAL HYSTERECTOMY  1990   partial  . DIRECT LARYNGOSCOPY N/A 10/28/2015   Procedure: DIRECT LARYNGOSCOPY WITH BIOPSY;  Surgeon: [name], MD;  Location: [place];  Service: ENT;  Laterality: N/A;  . ESOPHAGOSCOPY N/A 10/28/2015   Procedure: ESOPHAGOSCOPY;  Surgeon: [name], MD;  Location: [place];  Service: ENT;  Laterality: N/A;  . Start, [id number]  . LARYNGOSCOPY AND BRONCHOSCOPY N/A 10/28/2015   Procedure: BRONCHOSCOPY;  Surgeon: [name], MD;  Location: [place];  Service: ENT;  Laterality: N/A;  . MULTIPLE EXTRACTIONS WITH ALVEOLOPLASTY N/A 10/28/2015   Procedure: Extraction of tooth #'s 2-12, 14,15,17,18,20-29, 31 with alveoloplasy and mandibular left torus reduction;  Surgeon: Lenn Cal, DDS;  Location: [place];  Service: Oral Surgery;  Laterality: N/A;  . RECTOCELE REPAIR    . TONSILLECTOMY     as a child  . urocele     correction surgery    There were no vitals filed for this visit.      Subjective Assessment - 05/19/16 1104    Subjective Overall I'm feeling better than I did this weekend but still not great with being constipated and then having diarrhea, haven't called doctor about this yet but probably will today. My Rt scapula area is feeling much better, just sore and a little tight now, but not the pain I've been having.    Pertinent History Pt. with right neck mass x 6 months.  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  She completed XRT for neck in May and then was found to have lung nodules (metastases) and was treated with 9 SBRT sessions, sompleted 03/07/16.   HTN.   Patient Stated Goals get help with pain, fatigue   Currently in Pain? No/denies                         [facility] Adult PT Treatment/Exercise - 05/19/16 0001      Shoulder Exercises: Sidelying  Patient will benefit from skilled therapeutic intervention in order to improve the following deficits and impairments:  Decreased activity tolerance, Decreased knowledge of use of DME, Decreased range of motion, Decreased strength, Increased edema, Pain  Visit Diagnosis: Lymphedema, not elsewhere classified  Pain in thoracic spine  Muscle weakness (generalized)     Problem List Patient Active Problem List   Diagnosis Date Noted  . Lymphedema in adult patient 05/13/2016  . S/P gastrostomy ([place]) 03/16/2016  . Malignant neoplasm of lower lobe of left lung ([place]) 02/17/2016  . Malnutrition of moderate degree 01/04/2016  . Goals of care, counseling/discussion   . Dysphagia 12/31/2015  . 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 Encounter for palliative care   . Odynophagia   . Throat pain in adult 12/29/2015  . Nausea without vomiting 12/16/2015  . Anemia in chronic illness 12/16/2015  . Anemia due to other cause   . Dehydration   . Severe protein-calorie malnutrition ([place]) 11/14/2015  . Intractable nausea and vomiting 11/13/2015  . Gastritis, acute 11/13/2015  . Constipation due to opioid therapy 11/10/2015  . Cancer of base of tongue ([place]) 10/14/2015  . Skin lesion of back 03/21/2013  . Lipoma of axilla 03/21/2013  . Sleep disorder 03/17/2013  .  Skin lesion 03/17/2013  . Health care maintenance 03/17/2013  . Right knee pain 04/05/2012  . Health maintenance examination 10/17/2011  . Thalassemia 10/17/2011  . Essential hypertension 10/17/2011  . Hyperlipidemia 10/17/2011  . Headache(784.0) 10/17/2011    Otelia Limes, PTA 05/19/2016, 12:15 PM  [place], [place], [address] Phone: [phone]   Fax:  [phone]  Name: Debra Farrell MRN: [id number] Date of Birth: Jan 06, 1946  Patient will benefit from skilled therapeutic intervention in order to improve the following deficits and impairments:  Decreased activity tolerance, Decreased knowledge of use of DME, Decreased range of motion, Decreased strength, Increased edema, Pain  Visit Diagnosis: Lymphedema, not elsewhere classified  Pain in thoracic spine  Muscle weakness (generalized)     Problem List Patient Active Problem List   Diagnosis Date Noted  . Lymphedema in adult patient 05/13/2016  . S/P gastrostomy ([place]) 03/16/2016  . Malignant neoplasm of lower lobe of left lung ([place]) 02/17/2016  . Malnutrition of moderate degree 01/04/2016  . Goals of care, counseling/discussion   . Dysphagia 12/31/2015  . 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 Encounter for palliative care   . Odynophagia   . Throat pain in adult 12/29/2015  . Nausea without vomiting 12/16/2015  . Anemia in chronic illness 12/16/2015  . Anemia due to other cause   . Dehydration   . Severe protein-calorie malnutrition ([place]) 11/14/2015  . Intractable nausea and vomiting 11/13/2015  . Gastritis, acute 11/13/2015  . Constipation due to opioid therapy 11/10/2015  . Cancer of base of tongue ([place]) 10/14/2015  . Skin lesion of back 03/21/2013  . Lipoma of axilla 03/21/2013  . Sleep disorder 03/17/2013  .  Skin lesion 03/17/2013  . Health care maintenance 03/17/2013  . Right knee pain 04/05/2012  . Health maintenance examination 10/17/2011  . Thalassemia 10/17/2011  . Essential hypertension 10/17/2011  . Hyperlipidemia 10/17/2011  . Headache(784.0) 10/17/2011    Otelia Limes, PTA 05/19/2016, 12:15 PM  [place], [place], [address] Phone: [phone]   Fax:  [phone]  Name: Debra Farrell MRN: [id number] Date of Birth: Jan 06, 1946

## 2016-05-20 DIAGNOSIS — H524 Presbyopia: Secondary | ICD-10-CM | POA: Diagnosis not present

## 2016-05-23 ENCOUNTER — Ambulatory Visit: Payer: 59 | Admitting: Physical Therapy

## 2016-05-23 DIAGNOSIS — R1310 Dysphagia, unspecified: Secondary | ICD-10-CM | POA: Diagnosis not present

## 2016-05-23 DIAGNOSIS — I890 Lymphedema, not elsewhere classified: Secondary | ICD-10-CM | POA: Diagnosis not present

## 2016-05-23 DIAGNOSIS — M546 Pain in thoracic spine: Secondary | ICD-10-CM

## 2016-05-23 DIAGNOSIS — M6281 Muscle weakness (generalized): Secondary | ICD-10-CM

## 2016-05-23 DIAGNOSIS — M2560 Stiffness of unspecified joint, not elsewhere classified: Secondary | ICD-10-CM | POA: Diagnosis not present

## 2016-05-23 DIAGNOSIS — Z9189 Other specified personal risk factors, not elsewhere classified: Secondary | ICD-10-CM | POA: Diagnosis not present

## 2016-05-23 NOTE — Therapy (Signed)
Throat pain in adult 12/29/2015  . Nausea without vomiting 12/16/2015  . Anemia in chronic illness 12/16/2015  . Anemia due to other cause   . Dehydration   . Severe protein-calorie malnutrition ([place]) 11/14/2015  . Intractable nausea and vomiting 11/13/2015  . Gastritis, acute 11/13/2015  . Constipation due to opioid therapy 11/10/2015  . Cancer of base of tongue ([place]) 10/14/2015  . Skin lesion of back 03/21/2013  . Lipoma of axilla 03/21/2013  . Sleep disorder 03/17/2013  . Skin lesion 03/17/2013  . Health care maintenance 03/17/2013  . Right knee pain 04/05/2012  . Health maintenance examination 10/17/2011  . Thalassemia 10/17/2011  . Essential hypertension 10/17/2011  . Hyperlipidemia 10/17/2011  . Headache(784.0) 10/17/2011    Mellody Life, SPT 05/23/2016, 11:01 AM  [place], [place], [address] Phone: [phone]   Fax:  [phone]  Name: Debra Farrell MRN: [id number] Date of Birth: 11-27-1945   Read, reviewed, edited and agree with student's findings and recommendations.   Serafina Royals, PT 05/23/16 12:40 PM  [facility] [address] [place], [place], [address] Phone: [phone]   Fax:  [phone]  Physical Therapy Treatment  Patient Details  Name: Debra Farrell MRN: [id number] Date of Birth: Jun 04, 1946 Referring Provider: Dr. Artis Delay  Encounter Date: 05/23/2016      PT End of Session - 05/23/16 1056    Visit Number 7   Number of Visits 9   Date for PT Re-Evaluation 06/03/16   PT Start Time 0847   PT Stop Time 0929   PT Time Calculation (min) 42 min   Activity Tolerance Patient tolerated treatment well   Behavior During Therapy [facility] for tasks assessed/performed      Past Medical History:  Diagnosis Date  . Abdominal abscess ([facility]) 11/30/2015  . Anemia in chronic illness 12/16/2015  . 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  . Eczema   . Hx of radiation therapy completed 01/05/16   Base of Tongue  . Hx of radiation therapy    Left Lower Lung  . Hyperlipidemia   . Hypertension   . Infection with methicillin-resistant Staphylococcus aureus (MRSA) 11/30/2015  . Intractable nausea and vomiting 11/13/2015  . Laceration 04/2014   around R ear, for falling out of bed & hitting nightstand  . Nausea without vomiting 12/16/2015  . Thalassemia minor   . Throat pain in adult 12/29/2015    Past Surgical History:  Procedure Laterality Date  . ABDOMINAL HYSTERECTOMY  1990   partial  . DIRECT LARYNGOSCOPY N/A 10/28/2015   Procedure: DIRECT LARYNGOSCOPY WITH BIOPSY;  Surgeon: Flo Shanks, MD;  [facility] OR;  Service: ENT;  Laterality: N/A;  . ESOPHAGOSCOPY N/A 10/28/2015   Procedure: ESOPHAGOSCOPY;  Surgeon: Flo Shanks, MD;  Location: [facility] OR;  Service: ENT;  Laterality: N/A;  . INCONTINENCE SURGERY  1990, [id number]  . LARYNGOSCOPY AND BRONCHOSCOPY N/A 10/28/2015   Procedure: BRONCHOSCOPY;  Surgeon: Flo Shanks, MD;  Location: [facility] OR;  Service: ENT;  Laterality: N/A;  . MULTIPLE EXTRACTIONS WITH ALVEOLOPLASTY N/A 10/28/2015    Procedure: Extraction of tooth #'s 2-12, 14,15,17,18,20-29, 31 with alveoloplasy and mandibular left torus reduction;  Surgeon: Charlynne Pander, DDS;  Location: [facility] OR;  Service: Oral Surgery;  Laterality: N/A;  . RECTOCELE REPAIR    . TONSILLECTOMY     as a child  . urocele     correction surgery    There were no vitals filed for this visit.      Subjective Assessment - 05/23/16 0848    Subjective Patient states her pain has improved by 90%. She did some yard work after walking over yesterday, which made her pretty tired.   Pertinent History Pt. with right neck mass x 6 months.  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  She completed XRT for neck in May and then was found to have lung nodules (metastases) and was treated with 9 SBRT sessions, sompleted 03/07/16.   HTN.   Patient Stated Goals get help with pain, fatigue   Currently in Pain? No/denies                         [facility] Adult PT Treatment/Exercise - 05/23/16 0001      Manual Therapy   Manual Therapy Soft tissue mobilization;Scapular mobilization;Manual Lymphatic Drainage (MLD)   Soft tissue mobilization In prone to Rt scapula at medial border    Scapular Mobilization in left  Throat pain in adult 12/29/2015  . Nausea without vomiting 12/16/2015  . Anemia in chronic illness 12/16/2015  . Anemia due to other cause   . Dehydration   . Severe protein-calorie malnutrition ([place]) 11/14/2015  . Intractable nausea and vomiting 11/13/2015  . Gastritis, acute 11/13/2015  . Constipation due to opioid therapy 11/10/2015  . Cancer of base of tongue ([place]) 10/14/2015  . Skin lesion of back 03/21/2013  . Lipoma of axilla 03/21/2013  . Sleep disorder 03/17/2013  . Skin lesion 03/17/2013  . Health care maintenance 03/17/2013  . Right knee pain 04/05/2012  . Health maintenance examination 10/17/2011  . Thalassemia 10/17/2011  . Essential hypertension 10/17/2011  . Hyperlipidemia 10/17/2011  . Headache(784.0) 10/17/2011    Mellody Life, SPT 05/23/2016, 11:01 AM  [place], [place], [address] Phone: [phone]   Fax:  [phone]  Name: Debra Farrell MRN: [id number] Date of Birth: 11-27-1945   Read, reviewed, edited and agree with student's findings and recommendations.   Serafina Royals, PT 05/23/16 12:40 PM

## 2016-05-25 ENCOUNTER — Ambulatory Visit: Payer: 59 | Admitting: Physical Therapy

## 2016-05-25 ENCOUNTER — Encounter: Payer: Self-pay | Admitting: Hematology and Oncology

## 2016-05-25 DIAGNOSIS — Z9189 Other specified personal risk factors, not elsewhere classified: Secondary | ICD-10-CM | POA: Diagnosis not present

## 2016-05-25 DIAGNOSIS — M2560 Stiffness of unspecified joint, not elsewhere classified: Secondary | ICD-10-CM | POA: Diagnosis not present

## 2016-05-25 DIAGNOSIS — M6281 Muscle weakness (generalized): Secondary | ICD-10-CM | POA: Diagnosis not present

## 2016-05-25 DIAGNOSIS — I890 Lymphedema, not elsewhere classified: Secondary | ICD-10-CM | POA: Diagnosis not present

## 2016-05-25 DIAGNOSIS — R1310 Dysphagia, unspecified: Secondary | ICD-10-CM | POA: Diagnosis not present

## 2016-05-25 DIAGNOSIS — M546 Pain in thoracic spine: Secondary | ICD-10-CM | POA: Diagnosis not present

## 2016-05-25 NOTE — Therapy (Addendum)
[place], [place], [address] Phone: [phone]   Fax:  [phone]  Physical Therapy Treatment  Patient Details  Name: SPARKLE AUBE MRN: [id number] Date of Birth: [birth year] Referring Provider: Dr. [name]  Encounter Date: 05/25/2016      PT End of Session - 05/25/16 0937    Visit Number 8   Number of Visits 9   Date for PT Re-Evaluation 06/03/16   PT Start Time 9924   PT Stop Time 0931   PT Time Calculation (min) 44 min   Activity Tolerance Patient tolerated treatment well   Behavior During Therapy [facility] for tasks assessed/performed      Past Medical History:  Diagnosis Date  . Abdominal abscess ([place]) 11/30/2015  . Anemia in chronic illness 12/16/2015  . Cancer of base of tongue ([place]) 10/14/2015  . Eczema   . Hx of radiation therapy completed 01/05/16   Base of Tongue  . Hx of radiation therapy    Left Lower Lung  . Hyperlipidemia   . Hypertension   . Infection with methicillin-resistant Staphylococcus aureus (MRSA) 11/30/2015  . Intractable nausea and vomiting 11/13/2015  . Laceration 04/2014   around R ear, for falling out of bed & hitting nightstand  . Nausea without vomiting 12/16/2015  . Thalassemia minor   . Throat pain in adult 12/29/2015    Past Surgical History:  Procedure Laterality Date  . ABDOMINAL HYSTERECTOMY  1990   partial  . DIRECT LARYNGOSCOPY N/A 10/28/2015   Procedure: DIRECT LARYNGOSCOPY WITH BIOPSY;  Surgeon: [name], MD;  Location: [place];  Service: ENT;  Laterality: N/A;  . ESOPHAGOSCOPY N/A 10/28/2015   Procedure: ESOPHAGOSCOPY;  Surgeon: [name], MD;  Location: [place];  Service: ENT;  Laterality: N/A;  . [place], [id number]  . LARYNGOSCOPY AND BRONCHOSCOPY N/A 10/28/2015   Procedure: BRONCHOSCOPY;  Surgeon: [name], MD;  Location: [facility];  Service: ENT;  Laterality: N/A;  . MULTIPLE EXTRACTIONS WITH ALVEOLOPLASTY N/A 10/28/2015   Procedure: Extraction of tooth #'s 2-12, 14,15,17,18,20-29, 31 with alveoloplasy and mandibular left torus reduction;  Surgeon: Lenn Cal, DDS;  Location: [place];  Service: Oral Surgery;  Laterality: N/A;  . RECTOCELE REPAIR    . TONSILLECTOMY     as a child  . urocele     correction surgery    There were no vitals filed for this visit.      Subjective Assessment - 05/25/16 0849    Subjective Pain in left scapula is gone and she felt good after last session. She wears chip pack around neck while sleeping, but doesn't feel like it helps.   Pertinent History Pt. with right neck mass x 6 months.  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  She completed XRT for neck in May and then was found to have lung nodules (metastases) and was treated with 9 SBRT sessions, sompleted 03/07/16.   HTN.   Patient Stated Goals get help with pain, fatigue   Currently in Pain? No/denies               LYMPHEDEMA/ONCOLOGY QUESTIONNAIRE - 05/25/16 0911      Head and Neck   4 cm superior to sternal notch around neck 41 cm   6 cm superior to sternal notch around neck 39.9 cm   8 cm superior to sternal notch around neck 39.4 cm  PT Next Visit Plan assess goals for re-cert, continue manual lymph drainage and scapular/UE strengthening   Consulted and Agree with Plan of Care Patient      Patient will benefit from skilled therapeutic intervention in order to improve the following deficits and impairments:  Decreased activity tolerance, Decreased knowledge of use of DME, Decreased range of motion, Decreased strength, Increased edema, Pain  Visit Diagnosis: Lymphedema, not elsewhere classified  Pain in thoracic spine  Muscle weakness (generalized)     Problem List Patient Active Problem List   Diagnosis Date Noted  . Lymphedema in adult patient 05/13/2016  . S/P gastrostomy ([place]) 03/16/2016  . Malignant neoplasm of lower lobe of left lung ([place]) 02/17/2016  . Malnutrition of moderate degree 01/04/2016  . Goals of care, counseling/discussion   . Dysphagia 12/31/2015  . 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 Encounter for palliative care   . Odynophagia   . Throat pain in adult 12/29/2015  . Nausea without vomiting 12/16/2015  . Anemia in chronic illness 12/16/2015  . Anemia due to other cause   . Dehydration   . Severe protein-calorie malnutrition ([place]) 11/14/2015  . Intractable nausea and vomiting 11/13/2015  . Gastritis, acute  11/13/2015  . Constipation due to opioid therapy 11/10/2015  . Cancer of base of tongue ([place]) 10/14/2015  . Skin lesion of back 03/21/2013  . Lipoma of axilla 03/21/2013  . Sleep disorder 03/17/2013  . Skin lesion 03/17/2013  . Health care maintenance 03/17/2013  . Right knee pain 04/05/2012  . Health maintenance examination 10/17/2011  . Thalassemia 10/17/2011  . Essential hypertension 10/17/2011  . Hyperlipidemia 10/17/2011  . Headache(784.0) 10/17/2011    Mellody Life, SPT 05/25/2016, 9:43 AM  [place], [place], [address] Phone: [phone]   Fax:  [phone]  Name: IYA HAMED MRN: [id number] Date of Birth: 05-04-1946   This entire session was guided, instructed, and directly supervised by Serafina Royals, PT  Read, reviewed, edited and agree with student's findings and recommendations.   Serafina Royals, PT 05/25/16 3:01 PM PHYSICAL THERAPY DISCHARGE SUMMARY  Visits from Start of Care: 8  Current functional level related to goals / functional outcomes: Goals partially met as noted above.  Note that the patient did not return after 05/25/16, though that was the plan.  I phoned her on 08/24/16 to ask about that and she explained that she didn't have any more appointments scheduled so she didn't come anymore; apparently this was a miscue on our part not to ask her to make more appointments.   Remaining deficits: When phoned on 08/24/16, she reported she continues to suffer from fatigue that limits her activity; she is still not eating and attributes some fatigue to that.  She did report that her lymphedema has resolved.   Education / Equipment: Self-care for swelling; home exercise program.  Plan: Patient agrees to discharge.  Patient goals were partially met. Patient is being discharged due to not returning since the last visit.  ?????    Serafina Royals, PT 08/24/16  3:53 PM  [place], [place], [address] Phone: [phone]   Fax:  [phone]  Physical Therapy Treatment  Patient Details  Name: SPARKLE AUBE MRN: [id number] Date of Birth: [birth year] Referring Provider: Dr. [name]  Encounter Date: 05/25/2016      PT End of Session - 05/25/16 0937    Visit Number 8   Number of Visits 9   Date for PT Re-Evaluation 06/03/16   PT Start Time 9924   PT Stop Time 0931   PT Time Calculation (min) 44 min   Activity Tolerance Patient tolerated treatment well   Behavior During Therapy [facility] for tasks assessed/performed      Past Medical History:  Diagnosis Date  . Abdominal abscess ([place]) 11/30/2015  . Anemia in chronic illness 12/16/2015  . Cancer of base of tongue ([place]) 10/14/2015  . Eczema   . Hx of radiation therapy completed 01/05/16   Base of Tongue  . Hx of radiation therapy    Left Lower Lung  . Hyperlipidemia   . Hypertension   . Infection with methicillin-resistant Staphylococcus aureus (MRSA) 11/30/2015  . Intractable nausea and vomiting 11/13/2015  . Laceration 04/2014   around R ear, for falling out of bed & hitting nightstand  . Nausea without vomiting 12/16/2015  . Thalassemia minor   . Throat pain in adult 12/29/2015    Past Surgical History:  Procedure Laterality Date  . ABDOMINAL HYSTERECTOMY  1990   partial  . DIRECT LARYNGOSCOPY N/A 10/28/2015   Procedure: DIRECT LARYNGOSCOPY WITH BIOPSY;  Surgeon: [name], MD;  Location: [place];  Service: ENT;  Laterality: N/A;  . ESOPHAGOSCOPY N/A 10/28/2015   Procedure: ESOPHAGOSCOPY;  Surgeon: [name], MD;  Location: [place];  Service: ENT;  Laterality: N/A;  . [place], [id number]  . LARYNGOSCOPY AND BRONCHOSCOPY N/A 10/28/2015   Procedure: BRONCHOSCOPY;  Surgeon: [name], MD;  Location: [place];  Service: ENT;  Laterality: N/A;  . MULTIPLE EXTRACTIONS WITH ALVEOLOPLASTY N/A 10/28/2015   Procedure: Extraction of tooth #'s 2-12, 14,15,17,18,20-29, 31 with alveoloplasy and mandibular left torus reduction;  Surgeon: Lenn Cal, DDS;  Location: [place];  Service: Oral Surgery;  Laterality: N/A;  . RECTOCELE REPAIR    . TONSILLECTOMY     as a child  . urocele     correction surgery    There were no vitals filed for this visit.      Subjective Assessment - 05/25/16 0849    Subjective Pain in left scapula is gone and she felt good after last session. She wears chip pack around neck while sleeping, but doesn't feel like it helps.   Pertinent History Pt. with right neck mass x 6 months.  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  She completed XRT for neck in May and then was found to have lung nodules (metastases) and was treated with 9 SBRT sessions, sompleted 03/07/16.   HTN.   Patient Stated Goals get help with pain, fatigue   Currently in Pain? No/denies               LYMPHEDEMA/ONCOLOGY QUESTIONNAIRE - 05/25/16 0911      Head and Neck   4 cm superior to sternal notch around neck 41 cm   6 cm superior to sternal notch around neck 39.9 cm   8 cm superior to sternal notch around neck 39.4 cm

## 2016-05-25 NOTE — Progress Notes (Signed)
recd in box-left for dr. Alvy Bimler to sign

## 2016-05-26 ENCOUNTER — Encounter: Payer: Self-pay | Admitting: Hematology and Oncology

## 2016-05-26 NOTE — Progress Notes (Signed)
recd in box-left for dr. Alvy Bimler to sign-faxed matrix and let patient know ready for pck up 05/26/16

## 2016-06-01 ENCOUNTER — Encounter: Payer: Self-pay | Admitting: Hematology and Oncology

## 2016-06-01 NOTE — Progress Notes (Signed)
recd from fax 9/5. left for dr Alvy Bimler [place] Sheets

## 2016-06-01 NOTE — Progress Notes (Signed)
recd from fax 9/5. left for dr Ivette Loyal- faxed matrix [phone]- copy to medical recrds and mailed to patient

## 2016-06-02 DIAGNOSIS — C01 Malignant neoplasm of base of tongue: Secondary | ICD-10-CM | POA: Diagnosis not present

## 2016-06-02 DIAGNOSIS — E43 Unspecified severe protein-calorie malnutrition: Secondary | ICD-10-CM | POA: Diagnosis not present

## 2016-06-13 ENCOUNTER — Ambulatory Visit: Payer: 59 | Admitting: Nutrition

## 2016-06-13 ENCOUNTER — Ambulatory Visit: Payer: 59

## 2016-06-13 ENCOUNTER — Ambulatory Visit: Payer: 59 | Attending: Radiation Oncology

## 2016-06-13 ENCOUNTER — Other Ambulatory Visit: Payer: Self-pay | Admitting: Hematology and Oncology

## 2016-06-13 DIAGNOSIS — R1310 Dysphagia, unspecified: Secondary | ICD-10-CM | POA: Insufficient documentation

## 2016-06-13 MED FILL — DOXEPIN 25 MG CAPSULE: 25 | 7 days supply | Qty: 30 | Fill #0

## 2016-06-13 MED FILL — ONDANSETRON HCL 8 MG TABLET: 8 | 15 days supply | Qty: 30 | Fill #0

## 2016-06-13 NOTE — Patient Instructions (Addendum)
Have liquids or runny solids every day - hard swallow, small sips, keep a slow rate of eating. You didn't have any noticeable signs of aspiration, like you did with the hard ice cream. You HAVE to do your exercises at least twice each day. I think your swallowing muscles are still weak from lack of use.

## 2016-06-13 NOTE — Therapy (Addendum)
[facility] [address] [place], [place], [address] Phone: [phone]   Fax:  [phone]  Speech Language Pathology Treatment  Patient Details  Name: Debra Farrell MRN: [id number] Date of Birth: 1946/05/08 Referring Provider: Lonie Peak MD  Encounter Date: 06/13/2016      End of Session - 06/13/16 1406    Visit Number 5   Number of Visits 6   Date for SLP Re-Evaluation 07/27/16   SLP Start Time 1318   SLP Stop Time  1400   SLP Time Calculation (min) 42 min      Past Medical History:  Diagnosis Date  . Abdominal abscess (HCC) 11/30/2015  . Anemia in chronic illness 12/16/2015  . 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  . Eczema   . Hx of radiation therapy completed 01/05/16   Base of Tongue  . Hx of radiation therapy    Left Lower Lung  . Hyperlipidemia   . Hypertension   . Infection with methicillin-resistant Staphylococcus aureus (MRSA) 11/30/2015  . Intractable nausea and vomiting 11/13/2015  . Laceration 04/2014   around R ear, for falling out of bed & hitting nightstand  . Nausea without vomiting 12/16/2015  . Thalassemia minor   . Throat pain in adult 12/29/2015    Past Surgical History:  Procedure Laterality Date  . ABDOMINAL HYSTERECTOMY  1990   partial  . DIRECT LARYNGOSCOPY N/A 10/28/2015   Procedure: DIRECT LARYNGOSCOPY WITH BIOPSY;  Surgeon: Flo Shanks, MD;  Location: [facility] OR;  Service: ENT;  Laterality: N/A;  . ESOPHAGOSCOPY N/A 10/28/2015   Procedure: ESOPHAGOSCOPY;  Surgeon: Flo Shanks, MD;  Location: [facility] OR;  Service: ENT;  Laterality: N/A;  . INCONTINENCE SURGERY  1990, [id number]  . LARYNGOSCOPY AND BRONCHOSCOPY N/A 10/28/2015   Procedure: BRONCHOSCOPY;  Surgeon: Flo Shanks, MD;  Location: [facility] OR;  Service: ENT;  Laterality: N/A;  . MULTIPLE EXTRACTIONS WITH ALVEOLOPLASTY N/A 10/28/2015   Procedure: Extraction of tooth #'s 2-12, 14,15,17,18,20-29, 31 with alveoloplasy and mandibular left  torus reduction;  Surgeon: Charlynne Pander, DDS;  Location: [facility] OR;  Service: Oral Surgery;  Laterality: N/A;  . RECTOCELE REPAIR    . TONSILLECTOMY     as a child  . urocele     correction surgery    There were no vitals filed for this visit.      Subjective Assessment - 06/13/16 1324    Subjective "I need to progress because I'm getting tired of these tube feedings."   Currently in Pain? No/denies               ADULT SLP TREATMENT - 06/13/16 1325      General Information   Behavior/Cognition Alert;Cooperative;Pleasant mood     Treatment Provided   Treatment provided Dysphagia     Dysphagia Treatment   Temperature Spikes Noted No   Respiratory Status Room air   Treatment Methods Skilled observation;Upgraded PO texture trial;Therapeutic exercise   Type of PO's observed Thin liquids;Dysphagia 1 (puree)   Feeding Able to feed self   Pharyngeal Phase Signs & Symptoms Multiple swallows;Delayed cough   Other treatment/comments Pt reports that the thing holding her back from POs is "being scared." Pt has cut down on tube feeds (4 cans). HEP completed three times a week. SLP again told pt she MUST complete HEP x2/day to afford the best opportunity for improvement, that pt's swallowing opportunities are not sufficient to incr swallowing muscle strength. With POs, pt as above. SLP  suggested that pt work with dietician to have pt feel hungry, then pt has liquids PO to satisfy hunger.  No overt s/s with H2O, however with ice cream (solid), pharyngeal signs as above.      Assessment / Recommendations / Plan   Plan Continue with current plan of care     Dysphagia Recommendations   Diet recommendations Thin liquid   Compensations Small sips/bites;Slow rate;Effortful swallow     Progression Toward Goals   Progression toward goals Progressing toward goals          SLP Education - 06/13/16 1405    Education provided Yes   Education Details frequency HEP, need to do HEP x2  day average, need to swallow liquids daily, need to build muscle strength of swallowing muscles   Person(s) Educated Patient   Methods Explanation   Comprehension Verbalized understanding          SLP Short Term Goals - 02/08/16 1643      SLP SHORT TERM GOAL #1   Title pt will demo HEP with rare min A (renewed until 05-23-16)   Status Achieved     SLP SHORT TERM GOAL #2   Title pt will provide SLP with rationale for doing HEP   Status Achieved          SLP Long Term Goals - 06/13/16 1409      SLP LONG TERM GOAL #1   Title pt will demo HEP over two sessions with modified independence (all LTGs renewed until 08-17-16)   Time 2   Period --  visits   Status On-going     SLP LONG TERM GOAL #2   Title pt will tell SLP three s/s aspiration PNA with modified independence over 2 sessions   Time 2   Period --  visits   Status Revised     SLP LONG TERM GOAL #3   Title pt will tell SLP why a food journal can A with return to full PO diet   Time 2   Period --  visits   Status On-going          Plan - 06/13/16 1406    Clinical Impression Statement Pt cont to present with swallowing skills/safety decr'd due to complications from rad tx. See "other treatment/comments" for more details. Pt was again encouraged today to complete HEP as prescribed as well as to ingest liquid POs as tolerated. Palpation revealed little noted change today in fibrosis of submental/lateral areas near ramus of mandible. SHe req'd min A occasionally withe HEP initially, then was indpendent. SLP omitted chin push exercise as pt said she felt in TMJ. She requires cont'd skilled ST needed to cont to assess pt with success with HEP after read treatment, and pt safety with POs.   Speech Therapy Frequency --  once approx every four weeks.   Duration --  1 more visits (renewal needed 07-27-16)   Treatment/Interventions Aspiration precaution training;Pharyngeal strengthening exercises;Oral motor exercises;Diet  toleration management by SLP;Patient/family education;SLP instruction and feedback   Potential to Achieve Goals Fair   Potential Considerations Cooperation/participation level   Consulted and Agree with Plan of Care Patient      Patient will benefit from skilled therapeutic intervention in order to improve the following deficits and impairments:   Dysphagia    Problem List Patient Active Problem List   Diagnosis Date Noted  . Lymphedema in adult patient 05/13/2016  . S/P gastrostomy (HCC) 03/16/2016  . Malignant neoplasm of lower lobe of  left lung (HCC) 02/17/2016  . Malnutrition of moderate degree 01/04/2016  . Goals of care, counseling/discussion   . Dysphagia 12/31/2015  . 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 Encounter for palliative care   . Odynophagia   . Throat pain in adult 12/29/2015  . Nausea without vomiting 12/16/2015  . Anemia in chronic illness 12/16/2015  . Anemia due to other cause   . Dehydration   . Severe protein-calorie malnutrition (HCC) 11/14/2015  . Intractable nausea and vomiting 11/13/2015  . Gastritis, acute 11/13/2015  . Constipation due to opioid therapy 11/10/2015  . 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  . Skin lesion of back 03/21/2013  . Lipoma of axilla 03/21/2013  . Sleep disorder 03/17/2013  . Skin lesion 03/17/2013  . Health care maintenance 03/17/2013  . Right knee pain 04/05/2012  . Health maintenance examination 10/17/2011  . Thalassemia 10/17/2011  . Essential hypertension 10/17/2011  . Hyperlipidemia 10/17/2011  . Headache(784.0) 10/17/2011    Female Minish ,MS, CCC-SLP  06/13/2016, 2:54 PM  [facility] [address] [place], [place], [address] Phone: [phone]   Fax:  [phone]   Name: OFELIA ASQUITH MRN: [id number] Date of Birth: 1945-10-24

## 2016-06-13 NOTE — Progress Notes (Signed)
Nutrition follow-up completed with patient.  Patient is status post tongue cancer. Patient complaining of tube feeding intolerance. She reports increased mucus, nausea, vomiting, and constipation. Weight has improved and was documented as 151.3 pounds on August 18. Patient continues to bolus tube feedings, usually 2 cans at a time. Complains of continuing fatigue.   Nutrition diagnosis: Inadequate oral intake continues.  Intervention:  Encouraged patient to bolus smaller amounts of Osmolite 1.5 and be sure she infuses slowly. Encouraged liquids and food by mouth as allowed by speech therapist. Provided support and encouragement. Teach back method used.  Monitoring, evaluation, goals: Patient will modify tube feeding regimen to promote increased tolerance.  Next visit: To be scheduled as needed.  **Disclaimer: This note was dictated with voice recognition software. Similar sounding words can inadvertently be transcribed and this note may contain transcription errors which may not have been corrected upon publication of note.**

## 2016-06-15 ENCOUNTER — Other Ambulatory Visit: Payer: Self-pay | Admitting: *Deleted

## 2016-06-15 NOTE — Patient Outreach (Signed)
[place] [facility]) Care Management  06/15/2016  DANITZA SCHOENFELDT 11/08/1945 [id number]   RN completed an outreach attempt to reach this pt however unsuccessful. RN able to leave a HIPAA approved voice message requesting a call back. Will inquire further on any potential needs for [facility] services and/or resources. Will plan to contact pt once again on tomorrow.  Raina Mina, RN Care Management Coordinator [place] Office [phone]

## 2016-06-16 ENCOUNTER — Other Ambulatory Visit: Payer: Self-pay | Admitting: *Deleted

## 2016-06-16 NOTE — Patient Outreach (Signed)
[place] [facility]) Care Management  06/16/2016  Debra Farrell 03-04-1946 [id number]   RN spoke with pt today and introduced the [facility] program and services and the purpose for today's call. Pt reports she is still undergoing therapy for difficulty swallowing and will undergo another CT next month. States her eldest daughter is currently the main caregiver at this time.  RN verified pt uses the [facility] pharmacy for her medications with no delays or issues and continues to consult with her oncologist but has not seen her primary since last December. No issues have arise as pt continues to manage her medical issues with a good support system. RN offered additional resources and a follow up call if needed however pt feels she is managing her care well with the assistance from her daughters and declined any further follow up from [facility] services however grateful. No further calls will be made related to this referral,  Raina Mina, RN Care Management Coordinator [place] Office [phone]

## 2016-07-06 ENCOUNTER — Other Ambulatory Visit: Payer: Self-pay

## 2016-07-06 ENCOUNTER — Telehealth: Payer: Self-pay | Admitting: *Deleted

## 2016-07-06 ENCOUNTER — Encounter: Payer: Self-pay | Admitting: *Deleted

## 2016-07-06 ENCOUNTER — Encounter: Payer: Self-pay | Admitting: Radiation Oncology

## 2016-07-06 ENCOUNTER — Ambulatory Visit: Payer: 59

## 2016-07-06 DIAGNOSIS — C3432 Malignant neoplasm of lower lobe, left bronchus or lung: Secondary | ICD-10-CM

## 2016-07-06 NOTE — Telephone Encounter (Signed)
CALLED PATIENT TO INFORM OF CT FOR 07-07-16- ARRIVAL TIME - 2:45 PM @ [facility], PT. TO BE NPO 4 HRS. PRIOR TO TEST, PT. TO GET RESULTS ON 07-08-16, SPOKE WITH PATIENT AND SHE IS AWARE OF THESE APPTS.

## 2016-07-06 NOTE — Progress Notes (Signed)
Oncology Nurse Navigator Documentation  Debra Farrell attended the Fall 2017 [facility] survivorship program (5 consecutive Tuesday evening sessions, 6:00-7:15 pm beginning 06/07/16).  Gayleen Orem, RN, BSN, [place] at [place] [phone]

## 2016-07-06 NOTE — Progress Notes (Signed)
Ms. Figler presents for follow up of radiation completed 01/05/16 to her Base of Tongue and Bilateral neck. She also completed radiation on 03/23/16 to her Left Lower Lung.   Pain issues, if any: She denies pain. Using a feeding tube?: Yes she is instilling Osmolite 4-5 cans a day for nutrition, if she attempts more she experiences nausea.  Weight changes, if any:  Wt Readings from Last 3 Encounters:  07/08/16 143 lb 9.6 oz (65.1 kg)  05/13/16 151 lb 4.8 oz (68.6 kg)  05/03/16 148 lb 6.4 oz (67.3 kg)   Swallowing issues, if any: She is able to swallow water. She will occasionally try soft foods, and tells me sometimes it works and sometimes it doesn't. She needs to swallow extra water when she tried solids. She does report being nervous about eating because of the inconsistency of being able to swallow.  Smoking or chewing tobacco? No Using fluoride trays daily? No Last ENT visit was on: ? Dr. Erik Obey. She tells me she has not seen him recently and has no appointments scheduled.  Other notable issues, if any:  She has concerns about her feeding tube. There is redness around the site, she has been using neosporin to this area.   BP 135/66   Pulse 79   Temp 98.5 F (36.9 C)   Ht '5\' 6"'$  (1.676 m)   Wt 143 lb 9.6 oz (65.1 kg)   SpO2 100% Comment: room air  BMI 23.18 kg/m

## 2016-07-07 ENCOUNTER — Encounter (HOSPITAL_COMMUNITY): Payer: Self-pay

## 2016-07-07 ENCOUNTER — Ambulatory Visit (HOSPITAL_COMMUNITY)
Admission: RE | Admit: 2016-07-07 | Discharge: 2016-07-07 | Disposition: A | Discharge status: Home / Self Care | Payer: 59 | Source: Ambulatory Visit | Attending: Radiation Oncology | Admitting: Radiation Oncology

## 2016-07-07 ENCOUNTER — Ambulatory Visit
Admission: RE | Admit: 2016-07-07 | Discharge: 2016-07-07 | Disposition: A | Discharge status: Home / Self Care | Payer: 59 | Source: Ambulatory Visit | Attending: Radiation Oncology | Admitting: Radiation Oncology

## 2016-07-07 DIAGNOSIS — A4902 Methicillin resistant Staphylococcus aureus infection, unspecified site: Secondary | ICD-10-CM

## 2016-07-07 DIAGNOSIS — C029 Malignant neoplasm of tongue, unspecified: Secondary | ICD-10-CM | POA: Diagnosis not present

## 2016-07-07 DIAGNOSIS — C01 Malignant neoplasm of base of tongue: Secondary | ICD-10-CM | POA: Insufficient documentation

## 2016-07-07 DIAGNOSIS — R911 Solitary pulmonary nodule: Secondary | ICD-10-CM | POA: Insufficient documentation

## 2016-07-07 DIAGNOSIS — IMO0002 Reserved for concepts with insufficient information to code with codable children: Secondary | ICD-10-CM

## 2016-07-07 DIAGNOSIS — E041 Nontoxic single thyroid nodule: Secondary | ICD-10-CM | POA: Diagnosis not present

## 2016-07-07 DIAGNOSIS — R634 Abnormal weight loss: Secondary | ICD-10-CM | POA: Insufficient documentation

## 2016-07-07 DIAGNOSIS — C109 Malignant neoplasm of oropharynx, unspecified: Secondary | ICD-10-CM | POA: Diagnosis not present

## 2016-07-07 DIAGNOSIS — I700 Atherosclerosis of aorta: Secondary | ICD-10-CM | POA: Insufficient documentation

## 2016-07-07 DIAGNOSIS — C3432 Malignant neoplasm of lower lobe, left bronchus or lung: Secondary | ICD-10-CM | POA: Insufficient documentation

## 2016-07-07 DIAGNOSIS — E042 Nontoxic multinodular goiter: Secondary | ICD-10-CM | POA: Insufficient documentation

## 2016-07-07 LAB — BUN AND CREATININE (CC13)
BUN: 31.1 mg/dL — ABNORMAL HIGH (ref 7.0–26.0)
CREATININE: 1.2 mg/dL — AB (ref 0.6–1.1)
EGFR: 45 mL/min/{1.73_m2} — AB (ref >90)

## 2016-07-07 MED ORDER — IOPAMIDOL (ISOVUE-300) INJECTION 61%
75.0000 mL | Freq: Once | INTRAVENOUS | Status: AC | PRN
  Administered 2016-07-07: 75 mL via INTRAVENOUS

## 2016-07-07 MED FILL — DOXEPIN 25 MG CAPSULE: 25 | 7 days supply | Qty: 30 | Fill #1

## 2016-07-08 ENCOUNTER — Other Ambulatory Visit: Payer: Self-pay | Admitting: Hematology and Oncology

## 2016-07-08 ENCOUNTER — Ambulatory Visit
Admission: RE | Admit: 2016-07-08 | Discharge: 2016-07-08 | Disposition: A | Discharge status: Home / Self Care | Payer: 59 | Source: Ambulatory Visit | Attending: Radiation Oncology | Admitting: Radiation Oncology

## 2016-07-08 ENCOUNTER — Ambulatory Visit (HOSPITAL_BASED_OUTPATIENT_CLINIC_OR_DEPARTMENT_OTHER): Payer: 59

## 2016-07-08 ENCOUNTER — Encounter: Payer: Self-pay | Admitting: Radiation Oncology

## 2016-07-08 ENCOUNTER — Other Ambulatory Visit: Payer: Self-pay

## 2016-07-08 VITALS — BP 135/66 | HR 79 | Temp 98.5°F | Ht 66.0 in | Wt 143.6 lb

## 2016-07-08 DIAGNOSIS — Z452 Encounter for adjustment and management of vascular access device: Secondary | ICD-10-CM | POA: Diagnosis not present

## 2016-07-08 DIAGNOSIS — Z08 Encounter for follow-up examination after completed treatment for malignant neoplasm: Secondary | ICD-10-CM | POA: Diagnosis not present

## 2016-07-08 DIAGNOSIS — C3432 Malignant neoplasm of lower lobe, left bronchus or lung: Secondary | ICD-10-CM

## 2016-07-08 DIAGNOSIS — R634 Abnormal weight loss: Secondary | ICD-10-CM | POA: Diagnosis not present

## 2016-07-08 DIAGNOSIS — C01 Malignant neoplasm of base of tongue: Secondary | ICD-10-CM

## 2016-07-08 DIAGNOSIS — E042 Nontoxic multinodular goiter: Secondary | ICD-10-CM | POA: Diagnosis not present

## 2016-07-08 DIAGNOSIS — I700 Atherosclerosis of aorta: Secondary | ICD-10-CM | POA: Diagnosis not present

## 2016-07-08 MED ORDER — HEPARIN SOD (PORK) LOCK FLUSH 100 UNIT/ML IV SOLN
500.0000 [IU] | Freq: Once | INTRAVENOUS | Status: AC | PRN
  Administered 2016-07-08: 500 [IU] via INTRAVENOUS
  Filled 2016-07-08: qty 5

## 2016-07-08 MED ORDER — SODIUM CHLORIDE 0.9 % IJ SOLN
10.0000 mL | INTRAMUSCULAR | Status: DC | PRN
  Administered 2016-07-08: 10 mL via INTRAVENOUS
  Filled 2016-07-08: qty 10

## 2016-07-08 MED FILL — ONDANSETRON HCL 8 MG TABLET: 8 | 15 days supply | Qty: 30 | Fill #0

## 2016-07-08 NOTE — Progress Notes (Addendum)
Radiation Oncology         [phone] ________________________________  Name: Debra Farrell MRN: [id number]  Date: 07/08/2016  DOB: 09-28-1945  Follow-Up Visit Note  CC: Rodrigo Ran, MD  Flo Shanks, MD  Diagnosis and Prior Radiotherapy:       ICD-9-CM ICD-10-CM   1. Malignant neoplasm of lower lobe of left lung (HCC) 162.5 C34.32   2. Cancer of base of tongue (HCC) 141.0 C01      Left lower lung / 60 Gy in 5 fractions SBRT ; 6/192017-03/23/2016  Base of tongue and bilateral neck / 70 Gy in 35 fractions to gross disease, 63 Gy in 35 fractions to high risk nodal echelons, and 56 Gy in 35 fractions to intermediate risk nodal echelons ; 11/11/2015-01/05/2016  Narrative:  The patient returns today for routine follow-up. CT of the chest on 07/07/16 showed a significant decrease in size of the left lower lobe pulmonary nodule, no new or progressive disease in the thorax, a stable right thyroid nodule, and aortic atherosclerosis. CT of the neck on 07/07/16 showed shrinking cavitary right cervical lymph nodes which were not hypermetabolic on interval PET and no evidence of new or active disease in the neck.  Pain issues, if any: She denies pain.  Using a feeding tube?: Yes she is instilling Osmolite 4-5 cans a day for nutrition, if she attempts more she experiences nausea.   Weight changes, if any:  Wt Readings from Last 3 Encounters:  07/08/16 143 lb 9.6 oz (65.1 kg)  05/13/16 151 lb 4.8 oz (68.6 kg)  05/03/16 148 lb 6.4 oz (67.3 kg)   Swallowing issues, if any: She is able to swallow water. She will occasionally try soft foods, and tells me sometimes it works and sometimes it doesn't. She needs to swallow extra water when she tried solids. She does report being nervous about eating because of the inconsistency of being able to swallow.  Smoking or chewing tobacco? No Using fluoride trays daily? No Last ENT visit was on: ? Dr. Lazarus Salines. She tells me she has not seen him recently  and has no appointments scheduled.  Other notable issues, if any:  She has concerns about her feeding tube. There is redness around the site, she has been using neosporin to this area.   Plans to get dentures after seeing oral surgeon  ALLERGIES:  is allergic to morphine and related.  Meds: Current Outpatient Prescriptions  Medication Sig Dispense Refill  . Amino Acids-Protein Hydrolys (FEEDING SUPPLEMENT, PRO-STAT SUGAR FREE 64,) LIQD Place 30 mLs into feeding tube daily. 900 mL 0  . doxepin (SINEQUAN) 25 MG capsule TAKE 1 CAPSULE BY MOUTH EVERY 6 HOURS 30 capsule 2  . HYDROmorphone (DILAUDID) 4 MG tablet Take 1 tablet (4 mg total) by mouth every 4 (four) hours as needed for severe pain. 90 tablet 0  . Nutritional Supplements (FEEDING SUPPLEMENT, OSMOLITE 1.5 CAL,) LIQD Change Osmolite 1.5 or equivalent to bolus feedings of 7 cans daily. Give 2 cans TID and one can once daily. Give 120 cc free water before and after bolus feedings. Continue 45 mL Promod once daily. Give additional 500 cc free water daily via tube or by mouth as tolerated. 1659 mL 0  . ondansetron (ZOFRAN) 8 MG tablet TAKE 1 TABLET BY MOUTH 2 TIMES DAILY AS NEEDED. START ON THE THIRD DAY AFTER CHEMOTHERAPY. 30 tablet 0  . Water For Irrigation, Sterile (FREE WATER) SOLN Place 100 mLs into feeding tube 4 (four) times daily.  No current facility-administered medications for this encounter.     Physical Findings: The patient is in no acute distress. Patient is alert and oriented.   height is 5\' 6"  (1.676 m) and weight is 143 lb 9.6 oz (65.1 kg). Her temperature is 98.5 F (36.9 C). Her blood pressure is 135/66 and her pulse is 79. Her oxygen saturation is 100%. .  General: Alert and oriented, in no acute distress HEENT: Head is normocephalic. Extraocular movements are intact. Oropharynx without masses Neck: No palpable mass Skin: Skin in treatment fields shows satisfactory healing with hyperpigmentation and dryness,  improving Heart: Regular in rate and rhythm with no murmurs, rubs, or gallops. Chest: Clear to auscultation bilaterally, with no rhonchi, wheezes, or rales. Abdomen: PEG stoma with granulation tissue Extremities: No cyanosis or edema. Lymphatics: see Neck Exam Psychiatric: Judgment and insight are intact. Affect is appropriate.   Lab Findings: Lab Results  Component Value Date   WBC 4.0 05/13/2016   HGB 8.7 (L) 05/13/2016   HCT 27.0 (L) 05/13/2016   MCV 66.0 (L) 05/13/2016   PLT 236 05/13/2016    Lab Results  Component Value Date   TSH 1.425 03/21/2016   CMP     Component Value Date/Time   NA 138 05/13/2016 1149   K 4.2 05/13/2016 1149   CL 94 (L) 01/05/2016 0445   CO2 29 05/13/2016 1149   GLUCOSE 87 05/13/2016 1149   BUN 31.1 (H) 07/07/2016 1311   CREATININE 1.2 (H) 07/07/2016 1311   CALCIUM 9.8 05/13/2016 1149   PROT 6.8 05/13/2016 1149   ALBUMIN 3.7 05/13/2016 1149   AST 27 05/13/2016 1149   ALT 22 05/13/2016 1149   ALKPHOS 74 05/13/2016 1149   BILITOT 0.43 05/13/2016 1149   GFRNONAA >60 01/05/2016 0445   GFRAA >60 01/05/2016 0445     Radiographic Findings: Ct Soft Tissue Neck W Contrast  Result Date: 07/07/2016 CLINICAL DATA:  Restaging oral pharyngeal cancer. Diagnosis in 2016 with completed chemotherapy and radiotherapy. EXAM: CT NECK WITH CONTRAST TECHNIQUE: Multidetector CT imaging of the neck was performed using the standard protocol following the bolus administration of intravenous contrast. CONTRAST:  75mL ISOVUE-300 IOPAMIDOL (ISOVUE-300) INJECTION 61% COMPARISON:  02/16/2016 FINDINGS: Pharynx and larynx: Enhancement on the tongue base noted previously is less apparent today. No visualized residual primary tumor. Treatment changes with diffuse submucosal edema. Salivary glands: Symmetric posttreatment appearance. Thyroid: Multi nodular thyroid including exophytic predominately solid 28 mm nodule on the right. Patient is already being followed with CT.  Lymph nodes: 2 previously highlighted cavitary nodes in the right neck have undergone significant collapse and shrinkage. Residual tissue in this region measures up to 18 mm, previously at least 24 mm. These were non hypermetabolic on interval pad. No new nodal enlargement. Vascular: Negative Limited intracranial: No evidence of metastasis Visualized orbits: Negative Mastoids and visualized paranasal sinuses: Small bilateral mastoid fluid. No acute finding. Skeleton: No acute or aggressive finding. Degenerative changes in the cervical spine. Upper chest: Reported separately IMPRESSION: 1. Shrinking cavitary right cervical lymph nodes which were not hypermetabolic on interval PET. No evidence of new or active disease in the neck. 2. Chest reported separately. Electronically Signed   By: Marnee Spring M.D.   On: 07/07/2016 16:37   Ct Chest W Contrast  Result Date: 07/07/2016 CLINICAL DATA:  Followup tongue carcinoma and lung carcinoma. Status post chemotherapy and radiation therapy. Restaging. EXAM: CT CHEST WITH CONTRAST TECHNIQUE: Multidetector CT imaging of the chest was performed during intravenous contrast administration.  CONTRAST:  75mL ISOVUE-300 IOPAMIDOL (ISOVUE-300) INJECTION 61% COMPARISON:  02/15/2016 FINDINGS: Cardiovascular: Normal heart size. Aortic atherosclerosis. Right-sided Port-A-Cath remains in appropriate position. Mediastinum/Nodes: No pathologically enlarged lymph nodes identified. 2.7 cm right thyroid lobe nodule remains stable. Lungs/Pleura: Pulmonary nodule in the posterior left lower lobe measures 8 x 9 mm on image 56/3 compared to 14 x 20 mm on previous study. Scattered sub-cm pulmonary nodules seen in the right lung remains stable. Biapical pleural- parenchymal scarring is also unchanged. No evidence of pleural effusion. Upper Abdomen: Unremarkable. Percutaneous gastrostomy tube again seen in appropriate position. Musculoskeletal: No suspicious bone lesions or other significant  abnormality. IMPRESSION: Significant decrease in size of left lower lobe pulmonary nodule since prior exam. No new or progressive disease within the thorax. Stable right thyroid lobe nodule and aortic atherosclerosis. Electronically Signed   By: Myles Rosenthal M.D.   On: 07/07/2016 18:46    Impression/Plan:    1) Head and Neck Cancer Status: response to RT  2) Nutritional Status:some weight loss. Discussed methods to help w/ this PEG tube: still using  3) Risk Factors: The patient has been educated about risk factors including alcohol and tobacco abuse; they understand that avoidance of alcohol and tobacco is important to prevent recurrences as well as other cancers; abstaining from tobacco  4) Swallowing: will see if this is easier with dentures to mold tongue inside mouth.  Saw SLP  5) Dental: edentulous - getting dentures soon  6) Thyroid function: good Lab Results  Component Value Date   TSH 1.425 03/21/2016    7)  Follow-up in 3 months with CT chest and neck w/ med onc. I will ask Dr Bertis Ruddy to order imaging.  I will see her w/ the same + a TSH  in 46mo .     Refer to IR for silver nitrate if appropriate for PEG granulation tissue.   8) BUN/ Cr elevation. Advised to Push fluids.   Lonie Peak, MD  This document serves as a record of services personally performed by Lonie Peak, MD. It was created on her behalf by Eustace Moore, a trained medical scribe. The creation of this record is based on the scribe's personal observations and the provider's statements to them. This document has been checked and approved by the attending provider.

## 2016-07-19 DIAGNOSIS — E43 Unspecified severe protein-calorie malnutrition: Secondary | ICD-10-CM | POA: Diagnosis not present

## 2016-07-19 DIAGNOSIS — C01 Malignant neoplasm of base of tongue: Secondary | ICD-10-CM | POA: Diagnosis not present

## 2016-07-21 ENCOUNTER — Telehealth: Payer: Self-pay | Admitting: *Deleted

## 2016-07-21 MED ORDER — SILVER NITRATE-POT NITRATE 75-25 % EX MISC
CUTANEOUS | Status: AC
  Filled 2016-07-21: qty 3

## 2016-07-21 NOTE — Telephone Encounter (Signed)
Per Dr. Isidore Moos, contacted [facility] re silver nitrate tx of granulated tissue at PEG insertion site.  Told appt not needed, that I could obtained applicators and treat.  Obtained 3 ARSOL applicators and instruction from [employer].   Ms. Ryback arrived to RadOnc, I brought her to [place] where I treated granulated tissue.  Ms. Lacosse agreed to call me in the upcoming days to report on treatment efficacy.  Gayleen Orem, RN, BSN, [place] at [place] [phone]

## 2016-07-25 ENCOUNTER — Ambulatory Visit: Payer: Self-pay

## 2016-07-25 ENCOUNTER — Ambulatory Visit: Payer: 59 | Attending: Radiation Oncology

## 2016-07-25 DIAGNOSIS — R1310 Dysphagia, unspecified: Secondary | ICD-10-CM | POA: Diagnosis not present

## 2016-07-25 NOTE — Therapy (Signed)
Agree with Plan of Care Patient      Patient will benefit from skilled therapeutic intervention in order to improve the following deficits and impairments:   Dysphagia, unspecified type - Plan: SLP plan of care cert/re-cert    Problem List Patient Active Problem List   Diagnosis Date Noted  . Lymphedema in adult patient 05/13/2016  . S/P gastrostomy ([place]) 03/16/2016  . Malignant neoplasm of lower lobe of left lung ([place]) 02/17/2016  . Malnutrition of moderate degree 01/04/2016  . Goals of care, counseling/discussion   . Dysphagia 12/31/2015  . 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 Encounter for palliative care   . Odynophagia   . Throat pain in adult 12/29/2015  . Nausea without vomiting 12/16/2015  . Anemia in chronic illness 12/16/2015  . Anemia due to other cause   . Dehydration   . Severe protein-calorie malnutrition ([place]) 11/14/2015  . Intractable nausea and vomiting 11/13/2015  . Gastritis, acute 11/13/2015  . Constipation due to opioid therapy 11/10/2015  . Cancer of base of tongue ([place]) 10/14/2015  . Skin lesion of back 03/21/2013  . Lipoma of axilla 03/21/2013  . Sleep disorder 03/17/2013  . Skin lesion 03/17/2013  . Health care maintenance 03/17/2013  . Right knee pain 04/05/2012  . Health maintenance examination 10/17/2011  . Thalassemia 10/17/2011  . Essential hypertension 10/17/2011  . Hyperlipidemia 10/17/2011  .  Headache(784.0) 10/17/2011    [facility] ,[place], [place]  07/25/2016, 9:52 AM  [facility] [address] [place], [place], [address] Phone: [phone]   Fax:  [phone]   Name: Debra Farrell MRN: [id number] Date of Birth: 10-09-1945  [place] [address] [place], [place], [address] Phone: [phone]   Fax:  [phone]  Speech Language Pathology Treatment  Patient Details  Name: Debra Farrell MRN: [id number] Date of Birth: 1945-11-23 Referring Provider: [name] MD  Encounter Date: 07/25/2016      End of Session - 07/25/16 0939    Visit Number 6   Number of Visits 10   Date for SLP Re-Evaluation 10/26/16   SLP Start Time 0855   SLP Stop Time  0930   SLP Time Calculation (min) 35 min      Past Medical History:  Diagnosis Date  . Abdominal abscess ([place]) 11/30/2015  . Anemia in chronic illness 12/16/2015  . Cancer of base of tongue ([place]) 10/14/2015  . Eczema   . Hx of radiation therapy 11/11/15- 01/05/2016   Base of Tongue and bilateral neck  . Hx of radiation therapy 03/14/16- 03/23/16   Left Lower Lung  . Hyperlipidemia   . Hypertension   . Infection with methicillin-resistant Staphylococcus aureus (MRSA) 11/30/2015  . Intractable nausea and vomiting 11/13/2015  . Laceration 04/2014   around R ear, for falling out of bed & hitting nightstand  . Nausea without vomiting 12/16/2015  . Thalassemia minor   . Throat pain in adult 12/29/2015    Past Surgical History:  Procedure Laterality Date  . ABDOMINAL HYSTERECTOMY  1990   partial  . DIRECT LARYNGOSCOPY N/A 10/28/2015   Procedure: DIRECT LARYNGOSCOPY WITH BIOPSY;  Surgeon: [name], MD;  Location: [facility];  Service: ENT;  Laterality: N/A;  . ESOPHAGOSCOPY N/A 10/28/2015   Procedure: ESOPHAGOSCOPY;  Surgeon: [name], MD;  Location: [facility];  Service: ENT;  Laterality: N/A;  . [place], [id number]  . LARYNGOSCOPY AND BRONCHOSCOPY N/A 10/28/2015   Procedure: BRONCHOSCOPY;  Surgeon: [name], MD;  Location: [facility];  Service: ENT;  Laterality: N/A;  . MULTIPLE EXTRACTIONS WITH ALVEOLOPLASTY N/A 10/28/2015   Procedure: Extraction of tooth #'s 2-12, 14,15,17,18,20-29, 31  with alveoloplasy and mandibular left torus reduction;  Surgeon: Lenn Cal, DDS;  Location: [facility];  Service: Oral Surgery;  Laterality: N/A;  . RECTOCELE REPAIR    . TONSILLECTOMY     as a child  . urocele     correction surgery    There were no vitals filed for this visit.      Subjective Assessment - 07/25/16 0903    Subjective Pt is consuing 4 cans tube feed/day. Is eating solids, but awating dentures.   Currently in Pain? No/denies               ADULT SLP TREATMENT - 07/25/16 0904      General Information   Behavior/Cognition Alert;Cooperative;Pleasant mood     Treatment Provided   Treatment provided Dysphagia     Dysphagia Treatment   Temperature Spikes Noted No   Respiratory Status Room air   Treatment Methods Skilled observation;Therapeutic exercise   Patient observed directly with PO's Yes   Type of PO's observed Thin liquids;Dysphagia 3 (soft)   Liquids provided via --  bottle   Oral Phase Signs & Symptoms Other (comment)  none noted   Pharyngeal Phase Signs & Symptoms Delayed throat clear  intermittent   Other treatment/comments Pt without overt s/s aspiration PNA. Pt reports nausea with a certain amount of food - has to stop food. Water does not bring on nausea. SLP suggested smoothies/protein drinks and same problem with nausea, pt reports.  Agree with Plan of Care Patient      Patient will benefit from skilled therapeutic intervention in order to improve the following deficits and impairments:   Dysphagia, unspecified type - Plan: SLP plan of care cert/re-cert    Problem List Patient Active Problem List   Diagnosis Date Noted  . Lymphedema in adult patient 05/13/2016  . S/P gastrostomy ([place]) 03/16/2016  . Malignant neoplasm of lower lobe of left lung ([place]) 02/17/2016  . Malnutrition of moderate degree 01/04/2016  . Goals of care, counseling/discussion   . Dysphagia 12/31/2015  . 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 Encounter for palliative care   . Odynophagia   . Throat pain in adult 12/29/2015  . Nausea without vomiting 12/16/2015  . Anemia in chronic illness 12/16/2015  . Anemia due to other cause   . Dehydration   . Severe protein-calorie malnutrition ([place]) 11/14/2015  . Intractable nausea and vomiting 11/13/2015  . Gastritis, acute 11/13/2015  . Constipation due to opioid therapy 11/10/2015  . Cancer of base of tongue ([place]) 10/14/2015  . Skin lesion of back 03/21/2013  . Lipoma of axilla 03/21/2013  . Sleep disorder 03/17/2013  . Skin lesion 03/17/2013  . Health care maintenance 03/17/2013  . Right knee pain 04/05/2012  . Health maintenance examination 10/17/2011  . Thalassemia 10/17/2011  . Essential hypertension 10/17/2011  . Hyperlipidemia 10/17/2011  .  Headache(784.0) 10/17/2011    [facility] ,[place], [place]  07/25/2016, 9:52 AM  [facility] [address] [place], [place], [address] Phone: [phone]   Fax:  [phone]   Name: Debra Farrell MRN: [id number] Date of Birth: 10-09-1945

## 2016-08-09 ENCOUNTER — Other Ambulatory Visit: Payer: Self-pay | Admitting: *Deleted

## 2016-08-09 ENCOUNTER — Other Ambulatory Visit: Payer: Self-pay | Admitting: Hematology and Oncology

## 2016-08-09 MED ORDER — HYDROMORPHONE HCL 4 MG PO TABS: 4.0000 mg | ORAL_TABLET | ORAL | 0 refills | Status: DC | PRN

## 2016-08-09 MED FILL — ONDANSETRON HCL 8 MG TABLET: 8 | 15 days supply | Qty: 30 | Fill #0

## 2016-08-09 MED FILL — DOXEPIN 25 MG CAPSULE: 25 | 7 days supply | Qty: 30 | Fill #2

## 2016-08-11 ENCOUNTER — Telehealth: Payer: Self-pay | Admitting: *Deleted

## 2016-08-11 NOTE — Telephone Encounter (Signed)
Pt left VM states she has not heard back yet from nurse about Rx for Dilaudid.  I did not speak w/ pt, but I do see Rx for Dilaudid in Rx Book ready for pt to pick up.   Called pt back and apologized no one called her back yesterday but there is a Rx ready for her to pick up at our office.  Please call nurse back if any problems.

## 2016-08-15 MED FILL — HYDROmorphone HCL 4 MG TABS: 4 | 15 days supply | Qty: 90 | Fill #0

## 2016-08-25 DIAGNOSIS — C01 Malignant neoplasm of base of tongue: Secondary | ICD-10-CM | POA: Diagnosis not present

## 2016-08-25 DIAGNOSIS — E43 Unspecified severe protein-calorie malnutrition: Secondary | ICD-10-CM | POA: Diagnosis not present

## 2016-08-29 ENCOUNTER — Ambulatory Visit: Payer: 59 | Attending: Radiation Oncology

## 2016-08-29 DIAGNOSIS — R1310 Dysphagia, unspecified: Secondary | ICD-10-CM | POA: Insufficient documentation

## 2016-08-29 DIAGNOSIS — L929 Granulomatous disorder of the skin and subcutaneous tissue, unspecified: Secondary | ICD-10-CM | POA: Diagnosis not present

## 2016-08-29 NOTE — Therapy (Signed)
Malignant neoplasm of lower lobe of left lung ([place]) 02/17/2016  . Malnutrition of moderate degree 01/04/2016  . Goals of care, counseling/discussion   . Dysphagia 12/31/2015  . 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 Encounter for palliative care   . Odynophagia   . Throat pain in adult 12/29/2015  . Nausea without vomiting 12/16/2015  . Anemia in chronic illness 12/16/2015  . Anemia due to other cause   . Dehydration   . Severe protein-calorie malnutrition ([place]) 11/14/2015  . Intractable nausea and vomiting 11/13/2015  . Gastritis, acute 11/13/2015  . Constipation due to opioid therapy 11/10/2015  . Cancer of base of tongue ([place]) 10/14/2015  . Skin lesion of back 03/21/2013  . Lipoma of axilla 03/21/2013  . Sleep disorder 03/17/2013  . Skin lesion 03/17/2013  . Health care maintenance 03/17/2013  . Right knee pain 04/05/2012  . Health maintenance examination 10/17/2011  . Thalassemia 10/17/2011  . Essential hypertension 10/17/2011  . Hyperlipidemia 10/17/2011  . Headache(784.0) 10/17/2011    Lamya Lausch ,[place], [place]  08/29/2016, 9:39 AM  [facility] [address] [place], [place], [address] Phone: [phone]   Fax:  [phone]   Name: ELAJAH KUNSMAN MRN: [id number] Date of Birth: 10-06-1945  [place] [address] [place], [place], [address] Phone: [phone]   Fax:  [phone]  Speech Language Pathology Treatment  Patient Details  Name: Debra Farrell MRN: [id number] Date of Birth: Dec 24, 1945 Referring Provider: [name] MD  Encounter Date: 08/29/2016      End of Session - 08/29/16 0934    Visit Number 7   Number of Visits 10   Date for SLP Re-Evaluation 10/26/16   Authorization Type pt with Medicare on 09-26-16, [facility] UMR ending 09-25-16   SLP Start Time 0850   SLP Stop Time  0930   SLP Time Calculation (min) 40 min   Activity Tolerance Patient tolerated treatment well      Past Medical History:  Diagnosis Date  . Abdominal abscess ([place]) 11/30/2015  . Anemia in chronic illness 12/16/2015  . Cancer of base of tongue ([place]) 10/14/2015  . Eczema   . Hx of radiation therapy 11/11/15- 01/05/2016   Base of Tongue and bilateral neck  . Hx of radiation therapy 03/14/16- 03/23/16   Left Lower Lung  . Hyperlipidemia   . Hypertension   . Infection with methicillin-resistant Staphylococcus aureus (MRSA) 11/30/2015  . Intractable nausea and vomiting 11/13/2015  . Laceration 04/2014   around R ear, for falling out of bed & hitting nightstand  . Nausea without vomiting 12/16/2015  . Thalassemia minor   . Throat pain in adult 12/29/2015    Past Surgical History:  Procedure Laterality Date  . ABDOMINAL HYSTERECTOMY  1990   partial  . DIRECT LARYNGOSCOPY N/A 10/28/2015   Procedure: DIRECT LARYNGOSCOPY WITH BIOPSY;  Surgeon: [name], MD;  Location: [facility];  Service: ENT;  Laterality: N/A;  . ESOPHAGOSCOPY N/A 10/28/2015   Procedure: ESOPHAGOSCOPY;  Surgeon: [name], MD;  Location: [facility];  Service: ENT;  Laterality: N/A;  . [place], [id number]  . LARYNGOSCOPY AND BRONCHOSCOPY N/A 10/28/2015   Procedure: BRONCHOSCOPY;  Surgeon: [name], MD;  Location: [facility];  Service: ENT;  Laterality:  N/A;  . MULTIPLE EXTRACTIONS WITH ALVEOLOPLASTY N/A 10/28/2015   Procedure: Extraction of tooth #'s 2-12, 14,15,17,18,20-29, 31 with alveoloplasy and mandibular left torus reduction;  Surgeon: Lenn Cal, DDS;  Location: [facility];  Service: Oral Surgery;  Laterality: N/A;  . RECTOCELE REPAIR    . TONSILLECTOMY     as a child  . urocele     correction surgery    There were no vitals filed for this visit.      Subjective Assessment - 08/29/16 0901    Subjective "Anything with texture I have to have water." Dysgeusia and xerostomia hinder POs per pt. 4 cans/day.   Currently in Pain? No/denies               ADULT SLP TREATMENT - 08/29/16 0903      General Information   Behavior/Cognition Alert;Cooperative;Pleasant mood     Treatment Provided   Treatment provided Dysphagia     Dysphagia Treatment   Temperature Spikes Noted No   Respiratory Status Room air   Treatment Methods Skilled observation;Therapeutic exercise   Patient observed directly with PO's Yes   Liquids provided via --  bottle   Oral Phase Signs & Symptoms --  none noted   Pharyngeal Phase Signs & Symptoms --  throat clear initially, then subsided with SLP cues   Other treatment/comments Eating soups, frustrated she doesn't have teeth yet. Ageusia with applesauce, dysgeusia with meats, gravies. Pt with consistent  [place] [address] [place], [place], [address] Phone: [phone]   Fax:  [phone]  Speech Language Pathology Treatment  Patient Details  Name: Debra Farrell MRN: [id number] Date of Birth: Dec 24, 1945 Referring Provider: [name] MD  Encounter Date: 08/29/2016      End of Session - 08/29/16 0934    Visit Number 7   Number of Visits 10   Date for SLP Re-Evaluation 10/26/16   Authorization Type pt with Medicare on 09-26-16, [facility] UMR ending 09-25-16   SLP Start Time 0850   SLP Stop Time  0930   SLP Time Calculation (min) 40 min   Activity Tolerance Patient tolerated treatment well      Past Medical History:  Diagnosis Date  . Abdominal abscess ([place]) 11/30/2015  . Anemia in chronic illness 12/16/2015  . Cancer of base of tongue ([place]) 10/14/2015  . Eczema   . Hx of radiation therapy 11/11/15- 01/05/2016   Base of Tongue and bilateral neck  . Hx of radiation therapy 03/14/16- 03/23/16   Left Lower Lung  . Hyperlipidemia   . Hypertension   . Infection with methicillin-resistant Staphylococcus aureus (MRSA) 11/30/2015  . Intractable nausea and vomiting 11/13/2015  . Laceration 04/2014   around R ear, for falling out of bed & hitting nightstand  . Nausea without vomiting 12/16/2015  . Thalassemia minor   . Throat pain in adult 12/29/2015    Past Surgical History:  Procedure Laterality Date  . ABDOMINAL HYSTERECTOMY  1990   partial  . DIRECT LARYNGOSCOPY N/A 10/28/2015   Procedure: DIRECT LARYNGOSCOPY WITH BIOPSY;  Surgeon: [name], MD;  Location: [facility];  Service: ENT;  Laterality: N/A;  . ESOPHAGOSCOPY N/A 10/28/2015   Procedure: ESOPHAGOSCOPY;  Surgeon: [name], MD;  Location: [facility];  Service: ENT;  Laterality: N/A;  . [place], [id number]  . LARYNGOSCOPY AND BRONCHOSCOPY N/A 10/28/2015   Procedure: BRONCHOSCOPY;  Surgeon: [name], MD;  Location: [facility];  Service: ENT;  Laterality:  N/A;  . MULTIPLE EXTRACTIONS WITH ALVEOLOPLASTY N/A 10/28/2015   Procedure: Extraction of tooth #'s 2-12, 14,15,17,18,20-29, 31 with alveoloplasy and mandibular left torus reduction;  Surgeon: Lenn Cal, DDS;  Location: [facility];  Service: Oral Surgery;  Laterality: N/A;  . RECTOCELE REPAIR    . TONSILLECTOMY     as a child  . urocele     correction surgery    There were no vitals filed for this visit.      Subjective Assessment - 08/29/16 0901    Subjective "Anything with texture I have to have water." Dysgeusia and xerostomia hinder POs per pt. 4 cans/day.   Currently in Pain? No/denies               ADULT SLP TREATMENT - 08/29/16 0903      General Information   Behavior/Cognition Alert;Cooperative;Pleasant mood     Treatment Provided   Treatment provided Dysphagia     Dysphagia Treatment   Temperature Spikes Noted No   Respiratory Status Room air   Treatment Methods Skilled observation;Therapeutic exercise   Patient observed directly with PO's Yes   Liquids provided via --  bottle   Oral Phase Signs & Symptoms --  none noted   Pharyngeal Phase Signs & Symptoms --  throat clear initially, then subsided with SLP cues   Other treatment/comments Eating soups, frustrated she doesn't have teeth yet. Ageusia with applesauce, dysgeusia with meats, gravies. Pt with consistent

## 2016-08-29 NOTE — Patient Instructions (Signed)
Keep doing the exercises. I'm concerned that with limited POs over the last few months your swallowing muscles have a greater tendency to shrink.   I will talk with Raford Pitcher about your PO intake - how it's making you either nauseous or constipated, and see what she says.

## 2016-09-01 ENCOUNTER — Other Ambulatory Visit: Payer: Self-pay | Admitting: Hematology and Oncology

## 2016-09-01 DIAGNOSIS — C01 Malignant neoplasm of base of tongue: Secondary | ICD-10-CM

## 2016-09-02 ENCOUNTER — Other Ambulatory Visit (HOSPITAL_BASED_OUTPATIENT_CLINIC_OR_DEPARTMENT_OTHER): Payer: 59

## 2016-09-02 ENCOUNTER — Telehealth: Payer: Self-pay | Admitting: Hematology and Oncology

## 2016-09-02 ENCOUNTER — Encounter: Payer: Self-pay | Admitting: Hematology and Oncology

## 2016-09-02 ENCOUNTER — Ambulatory Visit (HOSPITAL_BASED_OUTPATIENT_CLINIC_OR_DEPARTMENT_OTHER): Payer: 59 | Admitting: Hematology and Oncology

## 2016-09-02 ENCOUNTER — Ambulatory Visit (HOSPITAL_BASED_OUTPATIENT_CLINIC_OR_DEPARTMENT_OTHER): Payer: 59

## 2016-09-02 ENCOUNTER — Telehealth: Payer: Self-pay | Admitting: *Deleted

## 2016-09-02 VITALS — BP 132/58 | HR 78 | Temp 98.0°F | Resp 17 | Ht 66.0 in | Wt 141.3 lb

## 2016-09-02 DIAGNOSIS — T402X5A Adverse effect of other opioids, initial encounter: Secondary | ICD-10-CM

## 2016-09-02 DIAGNOSIS — G893 Neoplasm related pain (acute) (chronic): Secondary | ICD-10-CM

## 2016-09-02 DIAGNOSIS — C3432 Malignant neoplasm of lower lobe, left bronchus or lung: Secondary | ICD-10-CM

## 2016-09-02 DIAGNOSIS — L929 Granulomatous disorder of the skin and subcutaneous tissue, unspecified: Secondary | ICD-10-CM

## 2016-09-02 DIAGNOSIS — Z79899 Other long term (current) drug therapy: Secondary | ICD-10-CM | POA: Diagnosis not present

## 2016-09-02 DIAGNOSIS — Z23 Encounter for immunization: Secondary | ICD-10-CM

## 2016-09-02 DIAGNOSIS — C01 Malignant neoplasm of base of tongue: Secondary | ICD-10-CM

## 2016-09-02 DIAGNOSIS — D61818 Other pancytopenia: Secondary | ICD-10-CM

## 2016-09-02 DIAGNOSIS — C7800 Secondary malignant neoplasm of unspecified lung: Secondary | ICD-10-CM | POA: Diagnosis not present

## 2016-09-02 DIAGNOSIS — C7802 Secondary malignant neoplasm of left lung: Secondary | ICD-10-CM

## 2016-09-02 DIAGNOSIS — Z931 Gastrostomy status: Secondary | ICD-10-CM

## 2016-09-02 DIAGNOSIS — Z299 Encounter for prophylactic measures, unspecified: Secondary | ICD-10-CM

## 2016-09-02 DIAGNOSIS — K5903 Drug induced constipation: Secondary | ICD-10-CM

## 2016-09-02 LAB — COMPREHENSIVE METABOLIC PANEL
ALBUMIN: 3.7 g/dL (ref 3.5–5.0)
ALK PHOS: 76 U/L (ref 40–150)
ALT: 18 U/L (ref 0–55)
ANION GAP: 7 meq/L (ref 3–11)
AST: 25 U/L (ref 5–34)
BILIRUBIN TOTAL: 0.52 mg/dL (ref 0.20–1.20)
BUN: 30.7 mg/dL — ABNORMAL HIGH (ref 7.0–26.0)
CO2: 28 meq/L (ref 22–29)
CREATININE: 1.2 mg/dL — AB (ref 0.6–1.1)
Calcium: 9.6 mg/dL (ref 8.4–10.4)
Chloride: 103 mEq/L (ref 98–109)
EGFR: 49 mL/min/{1.73_m2} — ABNORMAL LOW (ref >90)
GLUCOSE: 166 mg/dL — AB (ref 70–140)
Potassium: 4.1 mEq/L (ref 3.5–5.1)
Sodium: 138 mEq/L (ref 136–145)
TOTAL PROTEIN: 6.6 g/dL (ref 6.4–8.3)

## 2016-09-02 LAB — CBC WITH DIFFERENTIAL/PLATELET
BASO%: 0.4 % (ref 0.0–2.0)
BASOS ABS: 0 10*3/uL (ref 0.0–0.1)
EOS%: 1.2 % (ref 0.0–7.0)
Eosinophils Absolute: 0 10*3/uL (ref 0.0–0.5)
HEMATOCRIT: 25.5 % — AB (ref 34.8–46.6)
HGB: 8.1 g/dL — ABNORMAL LOW (ref 11.6–15.9)
LYMPH%: 15.4 % (ref 14.0–49.7)
MCH: 21.4 pg — AB (ref 25.1–34.0)
MCHC: 31.8 g/dL (ref 31.5–36.0)
MCV: 67.5 fL — AB (ref 79.5–101.0)
MONO#: 0.4 10*3/uL (ref 0.1–0.9)
MONO%: 15 % — AB (ref 0.0–14.0)
NEUT#: 1.8 10*3/uL (ref 1.5–6.5)
NEUT%: 68 % (ref 38.4–76.8)
Platelets: 222 10*3/uL (ref 145–400)
RBC: 3.78 10*6/uL (ref 3.70–5.45)
RDW: 14.1 % (ref 11.2–14.5)
WBC: 2.6 10*3/uL — ABNORMAL LOW (ref 3.9–10.3)
lymph#: 0.4 10*3/uL — ABNORMAL LOW (ref 0.9–3.3)

## 2016-09-02 LAB — TSH: TSH: 1.615 m(IU)/L (ref 0.308–3.960)

## 2016-09-02 MED ORDER — ONDANSETRON HCL 8 MG PO TABS: 8.0000 mg | ORAL_TABLET | Freq: Three times a day (TID) | ORAL | 1 refills | Status: DC | PRN

## 2016-09-02 MED ORDER — INFLUENZA VAC SPLIT QUAD 0.5 ML IM SUSY
0.5000 mL | PREFILLED_SYRINGE | Freq: Once | INTRAMUSCULAR | Status: AC
  Administered 2016-09-02: 0.5 mL via INTRAMUSCULAR
  Filled 2016-09-02: qty 0.5

## 2016-09-02 MED ORDER — SODIUM CHLORIDE 0.9 % IJ SOLN
10.0000 mL | INTRAMUSCULAR | Status: DC | PRN
  Administered 2016-09-02: 10 mL via INTRAVENOUS
  Filled 2016-09-02: qty 10

## 2016-09-02 MED ORDER — HEPARIN SOD (PORK) LOCK FLUSH 100 UNIT/ML IV SOLN
500.0000 [IU] | Freq: Once | INTRAVENOUS | Status: AC | PRN
  Administered 2016-09-02: 500 [IU] via INTRAVENOUS
  Filled 2016-09-02: qty 5

## 2016-09-02 MED ORDER — DOXEPIN HCL 25 MG PO CAPS: ORAL_CAPSULE | ORAL | 2 refills | Status: DC

## 2016-09-02 MED ORDER — HYDROMORPHONE HCL 4 MG PO TABS: 4.0000 mg | ORAL_TABLET | ORAL | 0 refills | Status: DC | PRN

## 2016-09-02 MED FILL — HYDROmorphone HCL 4 MG TABS: 4 | 15 days supply | Qty: 90 | Fill #0

## 2016-09-02 MED FILL — ONDANSETRON HCL 8 MG TABLET: 8 | 30 days supply | Qty: 90 | Fill #0

## 2016-09-02 MED FILL — DOXEPIN 25 MG CAPSULE: 25 | 22 days supply | Qty: 90 | Fill #0

## 2016-09-02 NOTE — Assessment & Plan Note (Signed)
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 She is not symptomatic. Recommend observation only. She does not need blood transfusion.

## 2016-09-02 NOTE — Assessment & Plan Note (Signed)
She has cancer associated pain. She is taking doxepin for mucositis. I refill her prescription pain medicine 

## 2016-09-02 NOTE — Progress Notes (Signed)
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 3 cm right tongue base mass consistent with malignancy.2. Necrotic right level II and III nodal metastases.3. Enlarged right thyroid lobe containing multiple nodules.      10/14/2015 Imaging    PET scan showed 1. Hypermetabolic right tongue base mass with hypermetabolic right level II and III adenopathy. Small left level III lymph node appears mildly hypermetabolic as well.2. A few scattered tiny pulmonary nodules are too small for PETresolution      10/16/2015 Imaging    [place] Head/Neck:  1) Multiple thyroid lesions bilaterally; dominant nodule in right lower pole measures 22 mm and meets fine-needle aspiration criteria. 2) Complex mass within the soft tissues of the right side of neck worrisome for metastatic malignancy.      10/26/2015 Pathology Results    Accession [id number]:  Thyroid FNA - Consistent with benign follicular nodule.      10/28/2015 Surgery    Multiple tooth extractions (26), right tongue base biopsy.      10/28/2015 Pathology Results    Accession [id number]:  Tongue, right base -  Infiltrative SCC, p16 positive.   Actinomyces infection.      11/05/2015 Procedure    Port-a-cath and PEG placed.      11/11/2015 - 01/05/2016 Radiation Therapy    She received radiation      11/11/2015 - 11/12/2015 Chemotherapy    She received high dose cisplatin. Treatment was discontinued permanently due to severe side-effects      11/13/2015 Adverse Reaction    She presented with intractable nausea and vomiting      11/13/2015 - 11/30/2015 Hospital Admission    She was hospitalized for intractable nausea, vomiting and developed acute renal failure, reversed with aggressive IV fluid hydration. Subsequently, she was found to have intra-abdominal infection with MRSA requiring placement of drainage tube and IV ABx      11/24/2015 Imaging    CT scan showed large abdominal abscess      11/25/2015 Procedure    She had placement of drain      12/08/2015 Procedure    Intra-abdominal drain was removed      12/08/2015 Imaging    CT abdomen showed resolution of abscess and a new left lower base lung nodules      12/31/2015 Imaging    Enlarging left lower lobe pulmonary nodule. This could represent a hematogenous metastasis although the rate of enlargement is faster than typical.      02/16/2016 Imaging    Pulmonary nodule in the left lower lobe has increased in size from 12/08/2015. This is a  new finding when compared with 10/13/2014. Findings are worrisome for progression of pulmonary metastasis      03/14/2016 -  Radiation Therapy    She has SBRT to lung lesion       INTERVAL HISTORY: Please see below for problem oriented charting. She is seen today with her daughter. Her mucositis pain and throat pain is stable with current prescription of pain medicine and doxepin. She have persistent granulation tissue near the feeding tube that bothers her, unresponsive to silver nitrate application. She denies recent infection. No cough. She has minimum swelling around the neck from lymphedema The patient denies any recent  signs or symptoms of bleeding such as spontaneous epistaxis, hematuria or hematochezia.  REVIEW OF SYSTEMS:   Constitutional: Denies fevers, chills or abnormal weight loss Eyes: Denies blurriness of vision Respiratory: Denies cough, dyspnea or wheezes Cardiovascular: Denies palpitation, chest discomfort or lower extremity swelling Gastrointestinal:  Denies nausea, heartburn or change in bowel habits Skin: Denies abnormal skin rashes Lymphatics: Denies new lymphadenopathy or easy bruising Neurological:Denies numbness, tingling or new weaknesses Behavioral/Psych: Mood is stable, no new changes  All other systems were reviewed with the patient and are negative.  I have reviewed the past medical history, past surgical history, social history and family history with the patient and they are unchanged from previous note.  ALLERGIES:  is allergic to morphine and related.  MEDICATIONS:  Current Outpatient Prescriptions  Medication Sig Dispense Refill  . Amino Acids-Protein Hydrolys (FEEDING SUPPLEMENT, PRO-STAT SUGAR FREE 64,) LIQD Place 30 mLs into feeding tube daily. 900 mL 0  . doxepin (SINEQUAN) 25 MG capsule TAKE 1 CAPSULE BY MOUTH EVERY 6 HOURS 90 capsule 2  . HYDROmorphone (DILAUDID) 4 MG tablet Take 1 tablet (4 mg total) by mouth every 4 (four) hours as needed for severe pain. 90 tablet 0  . Nutritional Supplements (FEEDING SUPPLEMENT, OSMOLITE 1.5 CAL,) LIQD Change Osmolite 1.5 or equivalent to bolus feedings of 7 cans daily. Give 2 cans TID and one can once daily. Give 120 cc free water before and after bolus feedings. Continue 45 mL Promod once daily. Give additional 500 cc free water daily via tube or by mouth as tolerated. 1659 mL 0  . ondansetron (ZOFRAN) 8 MG tablet Take 1 tablet (8 mg total) by mouth every 8 (eight) hours as needed for nausea or vomiting. 90 tablet 1  . Water For Irrigation, Sterile (FREE WATER) SOLN Place 100 mLs into feeding tube 4 (four) times daily.     No  current facility-administered medications for this visit.     PHYSICAL EXAMINATION: ECOG PERFORMANCE STATUS: 1 - Symptomatic but completely ambulatory  Vitals:   09/02/16 0918  BP: (!) 132/58  Pulse: 78  Resp: 17  Temp: 98 F (36.7 C)   Filed Weights   09/02/16 0918  Weight: 141 lb 4.8 oz (64.1 kg)    GENERAL:alert, no distress and comfortable. She looks pale SKIN: skin color, texture, turgor are normal, no rashes or significant lesions EYES: normal, Conjunctiva are pink and non-injected, sclera clear OROPHARYNX:no exudate, no erythema and lips, buccal mucosa, and tongue normal  NECK: No lymphedema around the neck LYMPH:  no palpable lymphadenopathy in the cervical, axillary or inguinal LUNGS: clear to auscultation and percussion with normal breathing effort HEART: regular rate & rhythm and no murmurs and no lower extremity edema ABDOMEN:abdomen soft, non-tender and normal bowel sounds. Feeding tube site looks okay. There is granulation tissue near the feeding tube Musculoskeletal:no cyanosis of digits and  no clubbing  NEURO: alert & oriented x 3 with fluent speech, no focal motor/sensory deficits  LABORATORY DATA:  I have reviewed the data as listed    Component Value Date/Time   NA 138 09/02/2016 0839   K 4.1 09/02/2016 0839   CL 94 (L) 01/05/2016 0445   CO2 28 09/02/2016 0839   GLUCOSE 166 (H) 09/02/2016 0839   BUN 30.7 (H) 09/02/2016 0839   CREATININE 1.2 (H) 09/02/2016 0839   CALCIUM 9.6 09/02/2016 0839   PROT 6.6 09/02/2016 0839   ALBUMIN 3.7 09/02/2016 0839   AST 25 09/02/2016 0839   ALT 18 09/02/2016 0839   ALKPHOS 76 09/02/2016 0839   BILITOT 0.52 09/02/2016 0839   GFRNONAA >60 01/05/2016 0445   GFRAA >60 01/05/2016 0445    No results found for: SPEP, UPEP  Lab Results  Component Value Date   WBC 2.6 (L) 09/02/2016   NEUTROABS 1.8 09/02/2016   HGB 8.1 (L) 09/02/2016   HCT 25.5 (L) 09/02/2016   MCV 67.5 (L) 09/02/2016   PLT 222 09/02/2016       Chemistry      Component Value Date/Time   NA 138 09/02/2016 0839   K 4.1 09/02/2016 0839   CL 94 (L) 01/05/2016 0445   CO2 28 09/02/2016 0839   BUN 30.7 (H) 09/02/2016 0839   CREATININE 1.2 (H) 09/02/2016 0839      Component Value Date/Time   CALCIUM 9.6 09/02/2016 0839   ALKPHOS 76 09/02/2016 0839   AST 25 09/02/2016 0839   ALT 18 09/02/2016 0839   BILITOT 0.52 09/02/2016 0839      ASSESSMENT & PLAN:  Cancer of base of tongue ([place]) She has completed recent treatment for metastatic cancer to the lung. I plan to repeat imaging study next month to assess response to treatment. In the meantime, she is asymptomatic. Continue supportive care  Metastasis to lung [facility]) She is not symptomatic. Plan to repeat imaging study next month to assess response to treatment  Granulation tissue of skin She has granulation tissue near the feeding tube that bothers her. She has not responded to silver nitrate. I will consult general surgery for excision  S/P gastrostomy ([place]) She has no further complication related to feeding tube.  Constipation due to opioid therapy We discussed laxative therapy.  Pancytopenia, acquired [facility]) She has acquired pancytopenia related to recent treatment. She is not symptomatic. Recommend observation only. She does not need blood transfusion.   Preventive measure We discussed the importance of preventive care and reviewed the vaccination programs. She does not have any prior allergic reactions to influenza vaccination. She agrees to proceed with influenza vaccination today and we will administer it today at the clinic.   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 She is taking doxepin for mucositis. I refill her prescription pain medicine   Orders Placed This Encounter  Procedures  . CT CHEST W CONTRAST    Standing Status:   Future    Standing Expiration Date:   10/07/2017    Order Specific Question:   Reason for exam:    Answer:    staging tongue ca to lung, assess response to Rx    Order Specific Question:   Preferred imaging location?    Answer:   [facility]  . CT Soft Tissue Neck W Contrast    Standing Status:   Future    Standing Expiration Date:   09/02/2017    Order Specific Question:   If  indicated for the ordered procedure, I authorize the administration of contrast media per Radiology protocol    Answer:   Yes    Order Specific Question:   Reason for Exam (SYMPTOM  OR DIAGNOSIS REQUIRED)    Answer:   staging tongue ca, assess response to Rx    Order Specific Question:   Preferred imaging location?    Answer:   [facility]  . Comprehensive metabolic panel    Standing Status:   Future    Standing Expiration Date:   10/07/2017  . CBC with Differential/Platelet    Standing Status:   Future    Standing Expiration Date:   10/07/2017  . Ambulatory referral to General Surgery    Referral Priority:   Routine    Referral Type:   Surgical    Referral Reason:   Specialty Services Required    Requested Specialty:   General Surgery    Number of Visits Requested:   1   All questions were answered. The patient knows to call the clinic with any problems, questions or concerns. No barriers to learning was detected. I spent 30 minutes counseling the patient face to face. The total time spent in the appointment was 40 minutes and more than 50% was on counseling and review of test results     [name], MD 09/02/2016 9:52 AM

## 2016-09-02 NOTE — Assessment & Plan Note (Signed)
We discussed the importance of preventive care and reviewed the vaccination programs. She does not have any prior allergic reactions to influenza vaccination. She agrees to proceed with influenza vaccination today and we will administer it today at the clinic.  

## 2016-09-02 NOTE — Assessment & Plan Note (Signed)
She is not symptomatic. Plan to repeat imaging study next month to assess response to treatment

## 2016-09-02 NOTE — Telephone Encounter (Signed)
Left message for [facility] - pt has a granulation beside her G-tube that needs to be removed by 09/25/16, any surgeon OK.  Asked to call us back.

## 2016-09-02 NOTE — Telephone Encounter (Signed)
Appointments scheduled per 09/02/16 los. A copy of the AVS report and appointment schedule was given to the patient, per 09/02/16 los.

## 2016-09-02 NOTE — Assessment & Plan Note (Signed)
She has completed recent treatment for metastatic cancer to the lung. I plan to repeat imaging study next month to assess response to treatment. In the meantime, she is asymptomatic. Continue supportive care

## 2016-09-02 NOTE — Assessment & Plan Note (Signed)
We discussed laxative therapy. 

## 2016-09-02 NOTE — Assessment & Plan Note (Signed)
She has granulation tissue near the feeding tube that bothers her. She has not responded to silver nitrate. I will consult general surgery for excision

## 2016-09-02 NOTE — Assessment & Plan Note (Signed)
She has no further complication related to feeding tube.

## 2016-09-06 MED FILL — AMOXICILLIN 875 MG TABLET: 875 | 7 days supply | Qty: 14 | Fill #0

## 2016-09-06 MED FILL — CHLORHEXIDINE 0.12% RINSE: 0.12 | 30 days supply | Qty: 473 | Fill #0

## 2016-09-14 DIAGNOSIS — L929 Granulomatous disorder of the skin and subcutaneous tissue, unspecified: Secondary | ICD-10-CM | POA: Diagnosis not present

## 2016-09-14 MED FILL — AMCINONIDE 0.1% CREAM: 0.1 | 10 days supply | Qty: 60 | Fill #0

## 2016-10-04 DIAGNOSIS — Z01818 Encounter for other preprocedural examination: Secondary | ICD-10-CM | POA: Diagnosis not present

## 2016-10-07 ENCOUNTER — Ambulatory Visit (HOSPITAL_COMMUNITY)
Admission: RE | Admit: 2016-10-07 | Discharge: 2016-10-07 | Disposition: A | Discharge status: Home / Self Care | Payer: Medicare Other | Source: Ambulatory Visit | Attending: Hematology and Oncology | Admitting: Hematology and Oncology

## 2016-10-07 DIAGNOSIS — E049 Nontoxic goiter, unspecified: Secondary | ICD-10-CM | POA: Diagnosis not present

## 2016-10-07 DIAGNOSIS — C099 Malignant neoplasm of tonsil, unspecified: Secondary | ICD-10-CM | POA: Diagnosis not present

## 2016-10-07 DIAGNOSIS — C3490 Malignant neoplasm of unspecified part of unspecified bronchus or lung: Secondary | ICD-10-CM | POA: Diagnosis not present

## 2016-10-07 DIAGNOSIS — C01 Malignant neoplasm of base of tongue: Secondary | ICD-10-CM | POA: Diagnosis not present

## 2016-10-07 DIAGNOSIS — C029 Malignant neoplasm of tongue, unspecified: Secondary | ICD-10-CM | POA: Diagnosis not present

## 2016-10-07 DIAGNOSIS — C3432 Malignant neoplasm of lower lobe, left bronchus or lung: Secondary | ICD-10-CM | POA: Insufficient documentation

## 2016-10-07 MED ORDER — IOPAMIDOL (ISOVUE-300) INJECTION 61%
INTRAVENOUS | Status: AC
  Filled 2016-10-07: qty 100

## 2016-10-07 MED ORDER — IOPAMIDOL (ISOVUE-300) INJECTION 61%
100.0000 mL | Freq: Once | INTRAVENOUS | Status: AC | PRN
  Administered 2016-10-07: 100 mL via INTRAVENOUS

## 2016-10-10 ENCOUNTER — Other Ambulatory Visit (HOSPITAL_BASED_OUTPATIENT_CLINIC_OR_DEPARTMENT_OTHER): Payer: Medicare Other

## 2016-10-10 ENCOUNTER — Ambulatory Visit (HOSPITAL_BASED_OUTPATIENT_CLINIC_OR_DEPARTMENT_OTHER): Payer: Medicare Other | Admitting: Hematology and Oncology

## 2016-10-10 ENCOUNTER — Telehealth: Payer: Self-pay | Admitting: Hematology and Oncology

## 2016-10-10 ENCOUNTER — Ambulatory Visit (HOSPITAL_BASED_OUTPATIENT_CLINIC_OR_DEPARTMENT_OTHER): Payer: Medicare Other

## 2016-10-10 ENCOUNTER — Encounter: Payer: Self-pay | Admitting: Hematology and Oncology

## 2016-10-10 VITALS — BP 132/61 | HR 87 | Temp 98.4°F | Resp 18 | Ht 66.0 in | Wt 137.6 lb

## 2016-10-10 DIAGNOSIS — C7802 Secondary malignant neoplasm of left lung: Secondary | ICD-10-CM | POA: Diagnosis not present

## 2016-10-10 DIAGNOSIS — C01 Malignant neoplasm of base of tongue: Secondary | ICD-10-CM

## 2016-10-10 DIAGNOSIS — Z931 Gastrostomy status: Secondary | ICD-10-CM

## 2016-10-10 DIAGNOSIS — I890 Lymphedema, not elsewhere classified: Secondary | ICD-10-CM | POA: Diagnosis not present

## 2016-10-10 DIAGNOSIS — D568 Other thalassemias: Secondary | ICD-10-CM

## 2016-10-10 DIAGNOSIS — C3432 Malignant neoplasm of lower lobe, left bronchus or lung: Secondary | ICD-10-CM

## 2016-10-10 DIAGNOSIS — R1310 Dysphagia, unspecified: Secondary | ICD-10-CM

## 2016-10-10 LAB — CBC WITH DIFFERENTIAL/PLATELET
BASO%: 0.2 % (ref 0.0–2.0)
BASOS ABS: 0 10*3/uL (ref 0.0–0.1)
EOS ABS: 0.1 10*3/uL (ref 0.0–0.5)
EOS%: 1.2 % (ref 0.0–7.0)
HCT: 25.6 % — ABNORMAL LOW (ref 34.8–46.6)
HGB: 8.1 g/dL — ABNORMAL LOW (ref 11.6–15.9)
LYMPH%: 8.6 % — AB (ref 14.0–49.7)
MCH: 21.3 pg — AB (ref 25.1–34.0)
MCHC: 31.6 g/dL (ref 31.5–36.0)
MCV: 67.4 fL — AB (ref 79.5–101.0)
MONO#: 0.6 10*3/uL (ref 0.1–0.9)
MONO%: 13.3 % (ref 0.0–14.0)
NEUT#: 3.2 10*3/uL (ref 1.5–6.5)
NEUT%: 76.7 % (ref 38.4–76.8)
PLATELETS: 204 10*3/uL (ref 145–400)
RBC: 3.8 10*6/uL (ref 3.70–5.45)
RDW: 14.3 % (ref 11.2–14.5)
WBC: 4.2 10*3/uL (ref 3.9–10.3)
lymph#: 0.4 10*3/uL — ABNORMAL LOW (ref 0.9–3.3)

## 2016-10-10 LAB — COMPREHENSIVE METABOLIC PANEL
ALBUMIN: 3.6 g/dL (ref 3.5–5.0)
ALK PHOS: 89 U/L (ref 40–150)
ALT: 40 U/L (ref 0–55)
AST: 36 U/L — ABNORMAL HIGH (ref 5–34)
Anion Gap: 10 mEq/L (ref 3–11)
BUN: 23.7 mg/dL (ref 7.0–26.0)
CO2: 25 mEq/L (ref 22–29)
Calcium: 9.4 mg/dL (ref 8.4–10.4)
Chloride: 104 mEq/L (ref 98–109)
Creatinine: 1 mg/dL (ref 0.6–1.1)
EGFR: 58 mL/min/{1.73_m2} — ABNORMAL LOW (ref >90)
GLUCOSE: 188 mg/dL — AB (ref 70–140)
POTASSIUM: 4.1 meq/L (ref 3.5–5.1)
SODIUM: 139 meq/L (ref 136–145)
Total Bilirubin: 0.5 mg/dL (ref 0.20–1.20)
Total Protein: 6.2 g/dL — ABNORMAL LOW (ref 6.4–8.3)

## 2016-10-10 MED ORDER — HEPARIN SOD (PORK) LOCK FLUSH 100 UNIT/ML IV SOLN
500.0000 [IU] | Freq: Once | INTRAVENOUS | Status: AC | PRN
  Administered 2016-10-10: 500 [IU] via INTRAVENOUS
  Filled 2016-10-10: qty 5

## 2016-10-10 MED ORDER — SODIUM CHLORIDE 0.9 % IJ SOLN
10.0000 mL | INTRAMUSCULAR | Status: DC | PRN
  Administered 2016-10-10: 10 mL via INTRAVENOUS
  Filled 2016-10-10: qty 10

## 2016-10-10 NOTE — Assessment & Plan Note (Signed)
She has significant dysphagia. She is able to swallow liquids without aspiration but is not able to eat any food. She has completed recent speech and language therapist evaluation and will continue swallow exercises as prescribed

## 2016-10-10 NOTE — Telephone Encounter (Signed)
Gave patient avs report and appointments for February and April.

## 2016-10-10 NOTE — Assessment & Plan Note (Signed)
She has completed recent treatment for metastatic cancer to the lung. Repeat CT scan of the head and neck and chest show no evidence of disease progression on new lesions In the meantime, she is asymptomatic. Continue supportive care She remain at high risk for recurrence in the first 2 years I recommend close ENT follow-up

## 2016-10-10 NOTE — Assessment & Plan Note (Signed)
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 The patient denies recent history of bleeding such as epistaxis, hematuria or hematochezia. She is asymptomatic from the anemia. We will observe for now.    

## 2016-10-10 NOTE — Assessment & Plan Note (Signed)
She has acquired lymphedema. She is participating in physical therapy.

## 2016-10-10 NOTE — Assessment & Plan Note (Signed)
She has no further complication related to feeding tube.

## 2016-10-10 NOTE — Progress Notes (Signed)
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 3 cm right tongue base mass consistent with malignancy.2. Necrotic right level II and III nodal metastases.3. Enlarged right thyroid lobe containing multiple nodules.      10/14/2015 Imaging    PET scan showed 1. Hypermetabolic right tongue base mass with hypermetabolic right level II and III adenopathy. Small left level III lymph node appears mildly hypermetabolic as well.2. A few scattered tiny pulmonary nodules are too small for PETresolution      10/16/2015 Imaging    [place] Head/Neck:  1) Multiple thyroid lesions bilaterally; dominant nodule in right lower pole measures 22 mm and meets fine-needle aspiration criteria. 2) Complex mass within the soft tissues of the right side of neck worrisome for metastatic malignancy.      10/26/2015 Pathology Results    Accession [id number]:  Thyroid FNA - Consistent with benign follicular nodule.      10/28/2015 Surgery    Multiple tooth extractions (26), right tongue base biopsy.      10/28/2015 Pathology Results    Accession [id number]:  Tongue, right base -  Infiltrative SCC, p16 positive.   Actinomyces infection.      11/05/2015 Procedure    Port-a-cath and PEG placed.      11/11/2015 - 01/05/2016 Radiation Therapy    She received radiation      11/11/2015 - 11/12/2015 Chemotherapy    She received high dose cisplatin. Treatment was discontinued permanently due to severe side-effects      11/13/2015 Adverse Reaction    She presented with intractable nausea and vomiting      11/13/2015 - 11/30/2015 Hospital Admission    She was hospitalized for intractable nausea, vomiting and developed acute renal failure, reversed with aggressive IV fluid hydration. Subsequently, she was found to have intra-abdominal infection with MRSA requiring placement of drainage tube and IV ABx      11/24/2015 Imaging    CT scan showed large abdominal abscess      11/25/2015 Procedure    She had placement of drain      12/08/2015 Procedure    Intra-abdominal drain was removed      12/08/2015 Imaging    CT abdomen showed resolution of abscess and a new left lower base lung nodules      12/31/2015 Imaging    Enlarging left lower lobe pulmonary nodule. This could represent a hematogenous metastasis although the rate of enlargement is faster than typical.      02/16/2016 Imaging    Pulmonary nodule in the left lower lobe has increased in size from 12/08/2015. This is a  new finding when compared with 10/13/2014. Findings are worrisome for progression of pulmonary metastasis      03/14/2016 -  Radiation Therapy    She has SBRT to lung lesion      10/07/2016 Imaging    CT neck showed stable exam compared to 07/07/2016, including the persistently enlarged right jugulodigastric node which was characterized on PET-CT 04/05/2016. No evidence of new or progressive disease      10/07/2016 Imaging    Continued decrease in size of the left lower lobe pulmonary nodule which now measures 5 x 5 mm and is surrounded by radiation changes. 2. Stable small right upper lobe pulmonary nodules. No new pulmonary  lesions. 3. Stable biapical pleural and parenchymal scarring changes. 4. No mediastinal or hilar mass or adenopathy. 5. Stable multinodular right thyroid goiter. 6. No findings for upper abdominal metastatic disease. Stable left adrenal gland adenoma.       INTERVAL HISTORY: Please see below for problem oriented charting. She returns with her daughter for follow-up She denies significant pain Denies recent chest pain, cough or shortness of breath She denies recurrence of cannulation tissue around feeding tube sites She complained of persistent dysphagia and is dependent on feeding tube for nutritional status She denies recent aspiration episodes  REVIEW OF SYSTEMS:   Constitutional: Denies fevers, chills or abnormal weight loss Eyes: Denies blurriness of vision Ears, nose, mouth, throat, and face: Denies mucositis or sore throat Respiratory: Denies cough, dyspnea or wheezes Cardiovascular: Denies palpitation, chest discomfort or lower extremity swelling Gastrointestinal:  Denies nausea, heartburn or change in bowel habits Skin: Denies abnormal skin rashes Lymphatics: Denies new lymphadenopathy or easy bruising Neurological:Denies numbness, tingling or new weaknesses Behavioral/Psych: Mood is stable, no new changes  All other systems were reviewed with the patient and are negative.  I have reviewed the past medical history, past surgical history, social history and family history with the patient and they are unchanged from previous note.  ALLERGIES:  is allergic to morphine and related.  MEDICATIONS:  Current Outpatient Prescriptions  Medication Sig Dispense Refill  . Amino Acids-Protein Hydrolys (FEEDING SUPPLEMENT, PRO-STAT SUGAR FREE 64,) LIQD Place 30 mLs into feeding tube daily. 900 mL 0  . doxepin (SINEQUAN) 25 MG capsule TAKE 1 CAPSULE BY MOUTH EVERY 6 HOURS 90 capsule 2  . HYDROmorphone (DILAUDID) 4 MG tablet Take 1 tablet (4 mg total) by mouth every 4 (four) hours as  needed for severe pain. 90 tablet 0  . Nutritional Supplements (FEEDING SUPPLEMENT, OSMOLITE 1.5 CAL,) LIQD Change Osmolite 1.5 or equivalent to bolus feedings of 7 cans daily. Give 2 cans TID and one can once daily. Give 120 cc free water before and after bolus feedings. Continue 45 mL Promod once daily. Give additional 500 cc free water daily via tube or by mouth as tolerated. 1659 mL 0  . ondansetron (ZOFRAN) 8 MG tablet Take 1 tablet (8 mg total) by mouth every 8 (eight) hours as needed for nausea or vomiting. 90 tablet 1  . Water For Irrigation, Sterile (FREE WATER) SOLN Place 100 mLs into feeding tube 4 (four) times daily.     No current facility-administered medications for this visit.     PHYSICAL EXAMINATION: ECOG PERFORMANCE STATUS: 1 - Symptomatic but completely ambulatory  Vitals:   10/10/16 0844  BP: 132/61  Pulse: 87  Resp: 18  Temp: 98.4 F (36.9 C)   Filed Weights   10/10/16 0844  Weight: 137 lb 9.6 oz (62.4 kg)  GENERAL:alert, no distress and comfortable SKIN: skin color, texture, turgor are normal, no rashes or significant lesions EYES: normal, Conjunctiva are pink and non-injected, sclera clear OROPHARYNX:no exudate, no erythema and lips, buccal mucosa, and tongue normal  NECK: Noted signs of lymphedema LYMPH:  no palpable lymphadenopathy in the cervical, axillary or inguinal LUNGS: clear to auscultation and percussion with normal breathing effort HEART: regular rate & rhythm and no murmurs and no lower extremity edema ABDOMEN:abdomen soft, non-tender and normal bowel sounds. Feeding tube site looks okay Musculoskeletal:no cyanosis of digits and no clubbing  NEURO: alert & oriented x 3 with fluent speech, no focal motor/sensory deficits  LABORATORY DATA:  I have reviewed the data as listed    Component Value Date/Time   NA 139 10/10/2016 0810   K 4.1 10/10/2016 0810   CL 94 (L) 01/05/2016 0445   CO2 25 10/10/2016 0810   GLUCOSE 188 (H) 10/10/2016 0810    BUN 23.7 10/10/2016 0810   CREATININE 1.0 10/10/2016 0810   CALCIUM 9.4 10/10/2016 0810   PROT 6.2 (L) 10/10/2016 0810   ALBUMIN 3.6 10/10/2016 0810   AST 36 (H) 10/10/2016 0810   ALT 40 10/10/2016 0810   ALKPHOS 89 10/10/2016 0810   BILITOT 0.50 10/10/2016 0810   GFRNONAA >60 01/05/2016 0445   GFRAA >60 01/05/2016 0445    No results found for: SPEP, UPEP  Lab Results  Component Value Date   WBC 4.2 10/10/2016   NEUTROABS 3.2 10/10/2016   HGB 8.1 (L) 10/10/2016   HCT 25.6 (L) 10/10/2016   MCV 67.4 (L) 10/10/2016   PLT 204 10/10/2016      Chemistry      Component Value Date/Time   NA 139 10/10/2016 0810   K 4.1 10/10/2016 0810   CL 94 (L) 01/05/2016 0445   CO2 25 10/10/2016 0810   BUN 23.7 10/10/2016 0810   CREATININE 1.0 10/10/2016 0810      Component Value Date/Time   CALCIUM 9.4 10/10/2016 0810   ALKPHOS 89 10/10/2016 0810   AST 36 (H) 10/10/2016 0810   ALT 40 10/10/2016 0810   BILITOT 0.50 10/10/2016 0810       RADIOGRAPHIC STUDIES: I have personally reviewed the radiological images as listed and agreed with the findings in the report. Ct Soft Tissue Neck W Contrast  Result Date: 10/07/2016 CLINICAL DATA:  Restaging tongue cancer. 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 Status post chemotherapy and radiation therapy. EXAM: CT NECK WITH CONTRAST TECHNIQUE: Multidetector CT imaging of the neck was performed using the standard protocol following the bolus administration of intravenous contrast. CONTRAST:  135m ISOVUE-300 IOPAMIDOL (ISOVUE-300) INJECTION 61% COMPARISON:  07/07/2016 FINDINGS: Pharynx and larynx: Stable architectural distortion. No change in enhancement or morphology to suggest recurrent tongue base primary. No metachronous lesion is seen. Submucosal edema in this patient with radiotherapy. Salivary glands: Posttreatment changes greatest in the sub mandibular glands. No new or acute finding Thyroid: Thyroid nodules largest on the right inferiorly  measuring up to 3 cm on axial images. The patient has undergone previous thyroid nodule biopsy. Lymph nodes: Enlarged right jugulodigastric node with central low density and calcification has a size stable appearance measuring up to 18 mm maximal diameter. This was a large necrotic nodal conglomerate on initial staging. This node was described on PET-CT 04/05/2016 as non hypermetabolic, and has diminished in size since that study. Vascular: No post treatment carotid stenosis. Porta catheter on the right, visualized portions unremarkable. Limited intracranial: Negative Visualized orbits: Negative Mastoids  and visualized paranasal sinuses: Clear Skeleton: No acute or aggressive finding Upper chest: Reported separately IMPRESSION: Stable exam compared to 07/07/2016, including the persistently enlarged right jugulodigastric node which was characterized on PET-CT 04/05/2016. No evidence of new or progressive disease. Electronically Signed   By: Monte Fantasia M.D.   On: 10/07/2016 08:32   Ct Chest W Contrast  Result Date: 10/07/2016 CLINICAL DATA:  Restaging lung cancer. Status post chemotherapy and radiation therapy. EXAM: CT CHEST WITH CONTRAST TECHNIQUE: Multidetector CT imaging of the chest was performed during intravenous contrast administration. CONTRAST:  110m ISOVUE-300 IOPAMIDOL (ISOVUE-300) INJECTION 61% COMPARISON:  CT scan 07/07/2016. FINDINGS: Chest wall: Stable right-sided Port-A-Cath. No complicating features. No supraclavicular or axillary lymphadenopathy. No breast masses. Stable multinodular right thyroid goiter. Cardiovascular: The heart is normal in size. No pericardial effusion. The aorta is normal in caliber. Minimal scattered atherosclerotic calcifications. No dissection. The branch vessels are patent. No significant coronary artery calcifications. Mediastinum/Nodes: No mediastinal or hilar mass or lymphadenopathy. The esophagus is grossly normal. Lungs/Pleura: Stable biapical pleural and  parenchymal scarring changes. Stable 3.5 mm right lower lobe pulmonary nodule on image number 93. Stable 3.5 mm right lower lobe nodule on image number 104. Focal radiation changes surrounding the left lower lobe lesion likely from SBRT. The nodule measures approximately 5 x 5 mm and previously measured 9 x 8 mm. No new pulmonary lesions. Upper Abdomen: No significant upper abdominal findings. A feeding G-tube is noted. Stable small left adrenal gland nodule consistent with benign adenoma. It measured less than 10 Hounsfield units on the prior noncontrast PET-CT. Musculoskeletal: No significant bony findings. No worrisome bone lesions to suggest metastatic disease. IMPRESSION: 1. Continued decrease in size of the left lower lobe pulmonary nodule which now measures 5 x 5 mm and is surrounded by radiation changes. 2. Stable small right upper lobe pulmonary nodules. No new pulmonary lesions. 3. Stable biapical pleural and parenchymal scarring changes. 4. No mediastinal or hilar mass or adenopathy. 5. Stable multinodular right thyroid goiter. 6. No findings for upper abdominal metastatic disease. Stable left adrenal gland adenoma. Electronically Signed   By: PMarijo SanesM.D.   On: 10/07/2016 09:16    ASSESSMENT & PLAN:  [place] She has completed recent treatment for metastatic cancer to the lung. Repeat CT scan of the head and neck and chest show no evidence of disease progression on new lesions In the meantime, she is asymptomatic. 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 Plan to repeat imaging study in 3 months to assess response to treatment and to exclude disease progression In the meantime, I recommend we keep her port patent  Lymphedema in adult patient She has acquired lymphedema. She is participating in physical therapy.  [place] has no further complication related to feeding tube.  Thalassemia This is likely anemia of chronic disease with background of thalassemia. The patient denies recent history of bleeding such as epistaxis, hematuria or hematochezia. She is asymptomatic from the anemia. We will observe for now.     Dysphagia She has significant dysphagia. She is able to swallow liquids without aspiration but is not able to eat any food. She has completed recent speech and language therapist evaluation and will continue swallow exercises as prescribed    Orders Placed This Encounter  Procedures  . CT CHEST W CONTRAST  Standing Status:   Future    Standing Expiration Date:   11/14/2017    Order Specific Question:   Reason for exam:    Answer:   lung met, assess for progression    Order Specific Question:   Preferred imaging location?    Answer:   [facility]   All questions were answered. The patient knows to call the clinic with any problems, questions or concerns. No barriers to learning was detected. I spent 25 minutes counseling the patient face to face. The total time spent in the appointment was 30 minutes and more than 50% was on counseling and review of test results     [name], MD 10/10/2016 10:05 AM

## 2016-10-10 NOTE — Assessment & Plan Note (Signed)
She is not symptomatic. Plan to repeat imaging study in 3 months to assess response to treatment and to exclude disease progression In the meantime, I recommend we keep her port patent

## 2016-11-11 IMAGING — US US RENAL
1 series · 14 of 25 positions shown · non-contrast
Comparison: None.

CLINICAL DATA: Acute renal failure

EXAM:
RENAL / URINARY TRACT ULTRASOUND COMPLETE

[Series 1: us renal · 0.23mm/px · 14 of 71 slices shown]
[im 1/71]
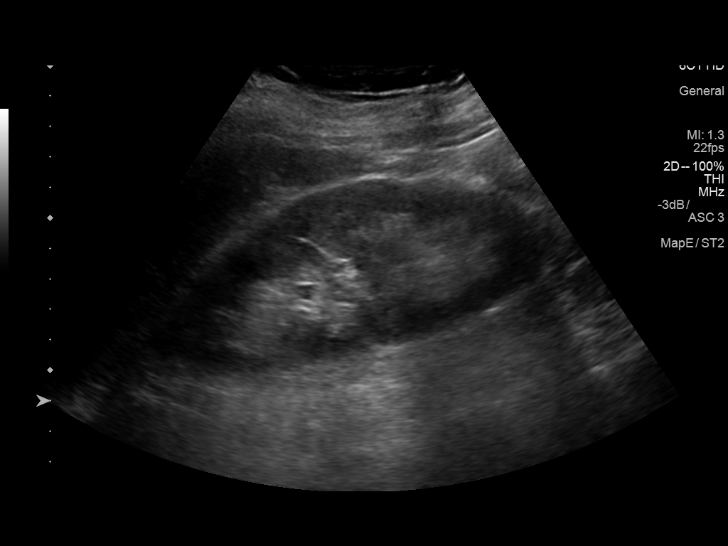
[im 6/71]
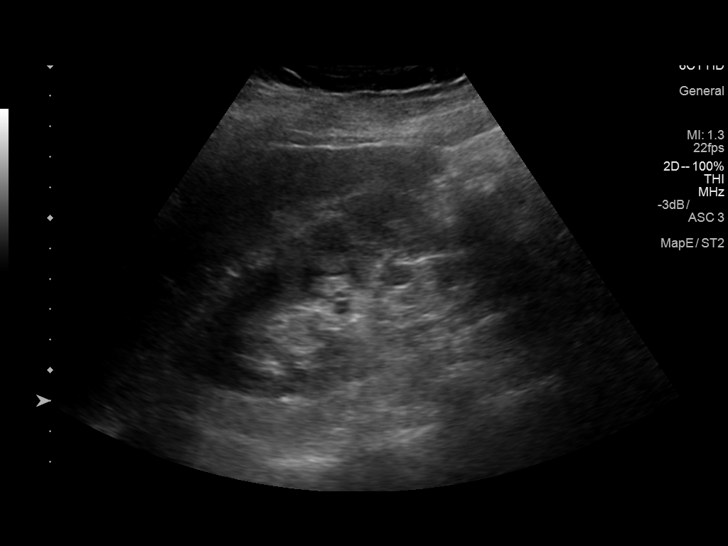
[im 12/71]
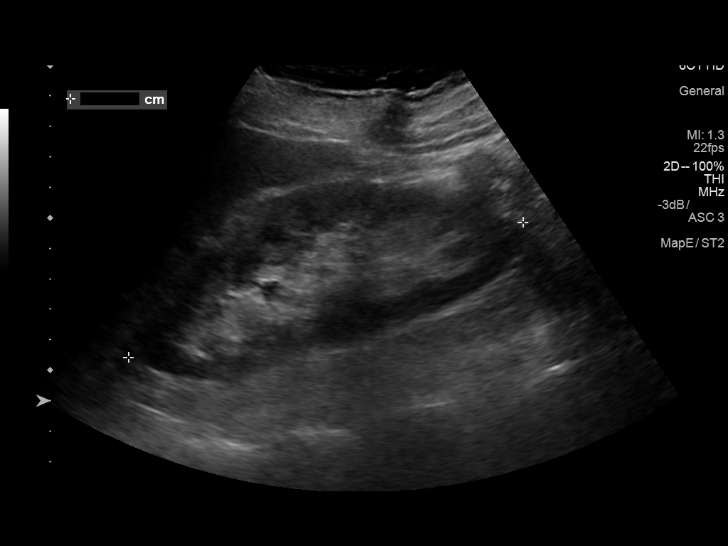
[im 18/71]
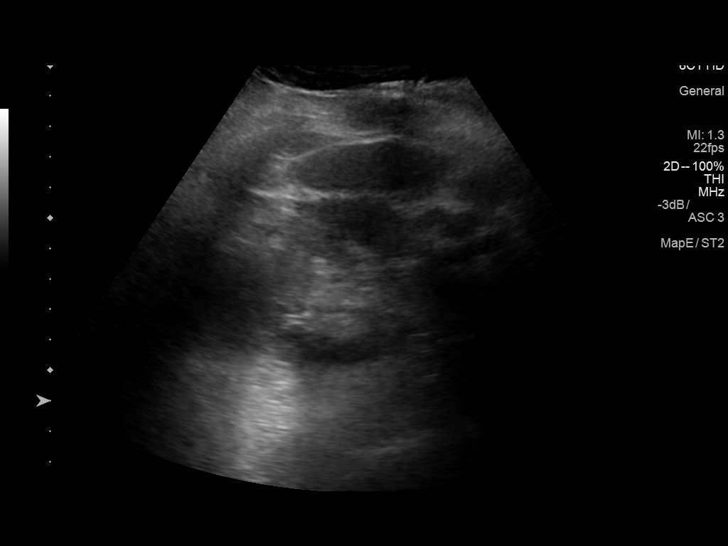
[im 24/71]
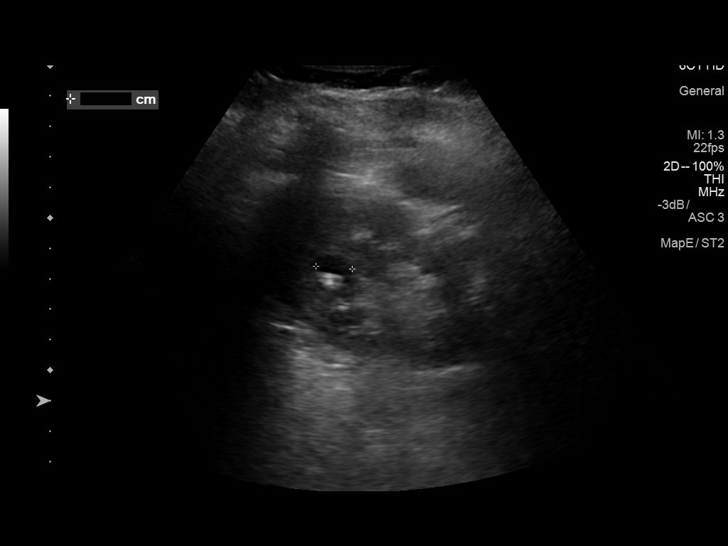
[im 27/71]
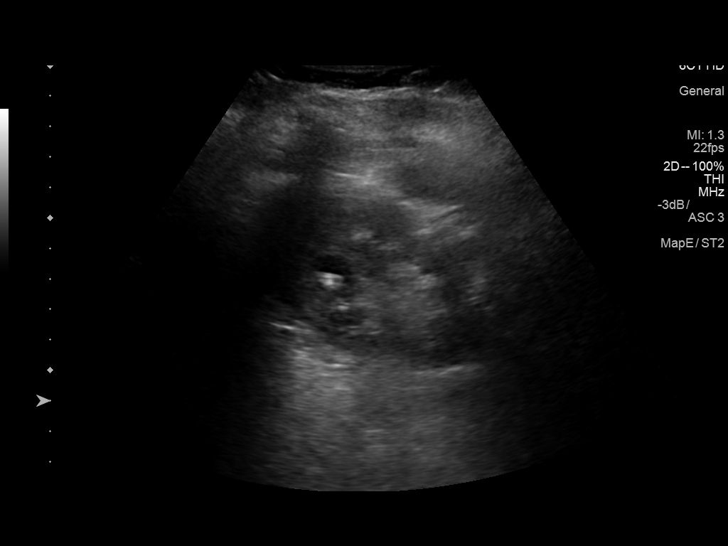
[im 33/71]
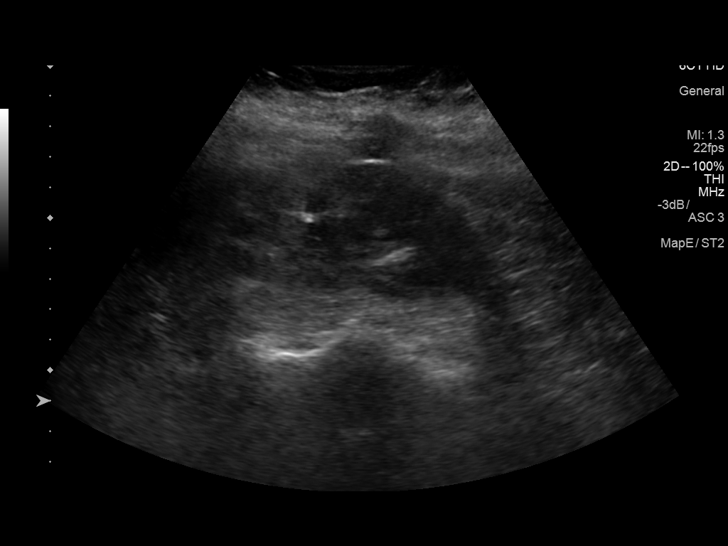
[im 38/71]
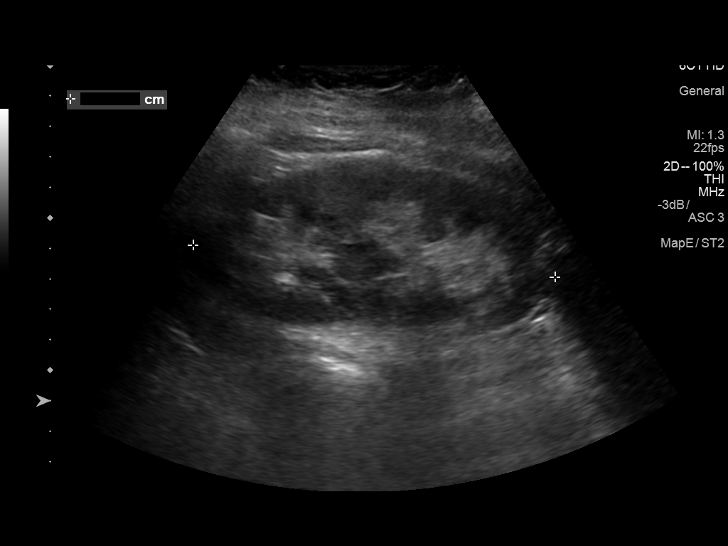
[im 44/71]
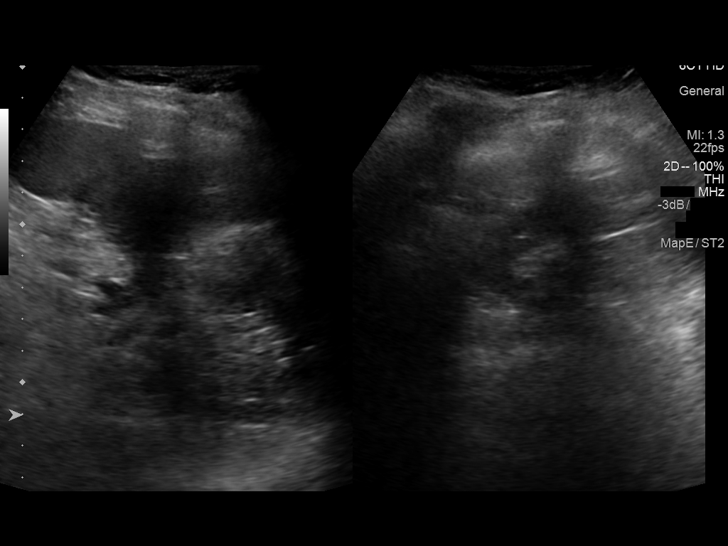
[im 47/71]
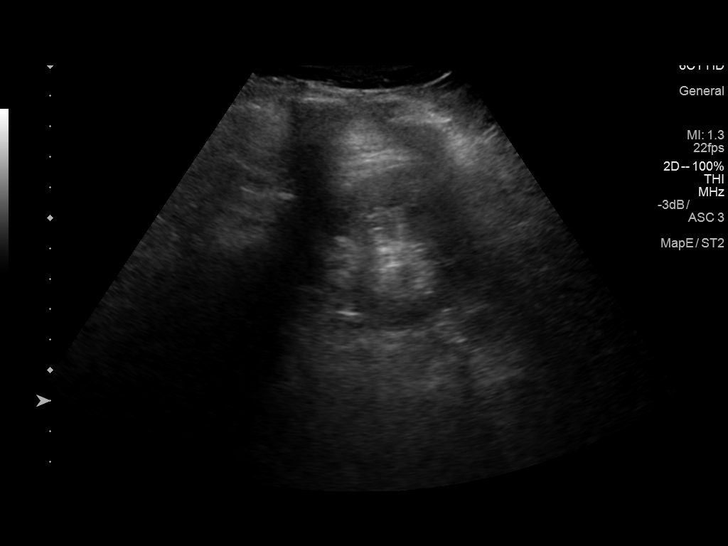
[im 53/71]
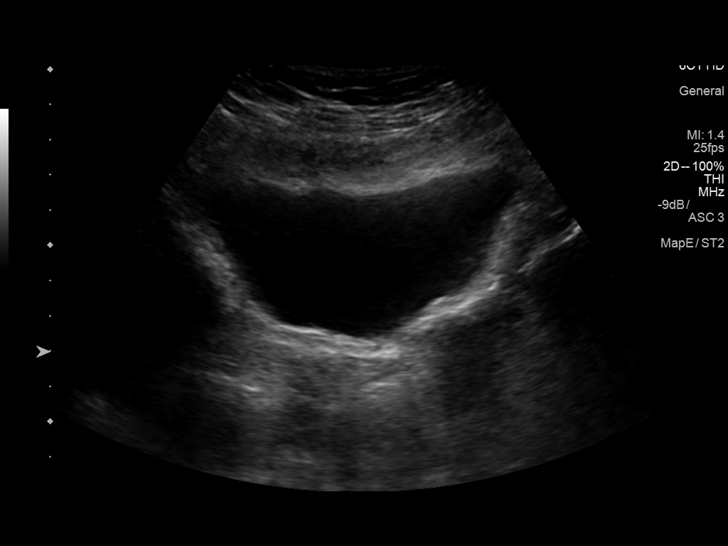
[im 59/71]
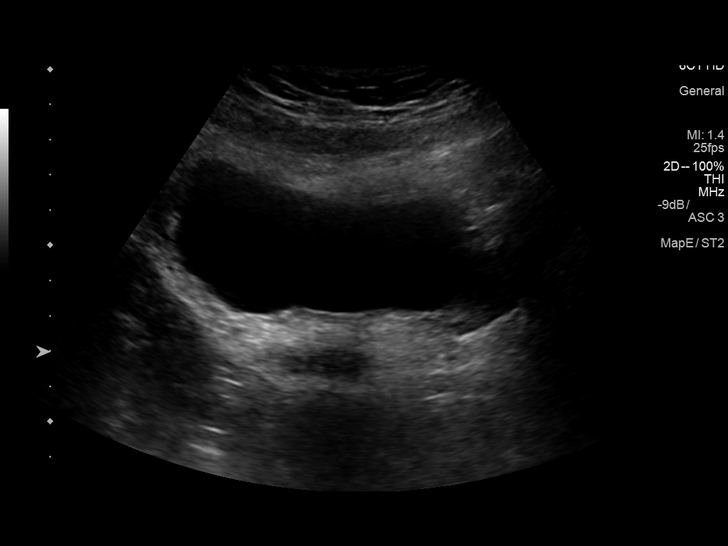
[im 65/71]
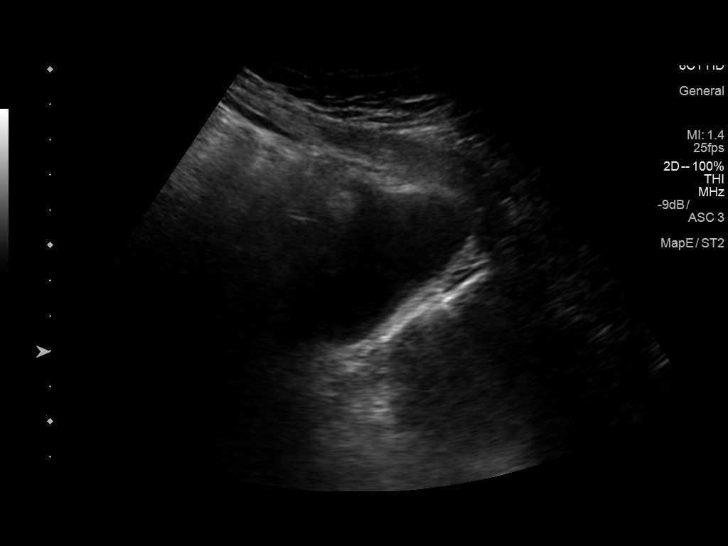
[im 71/71]
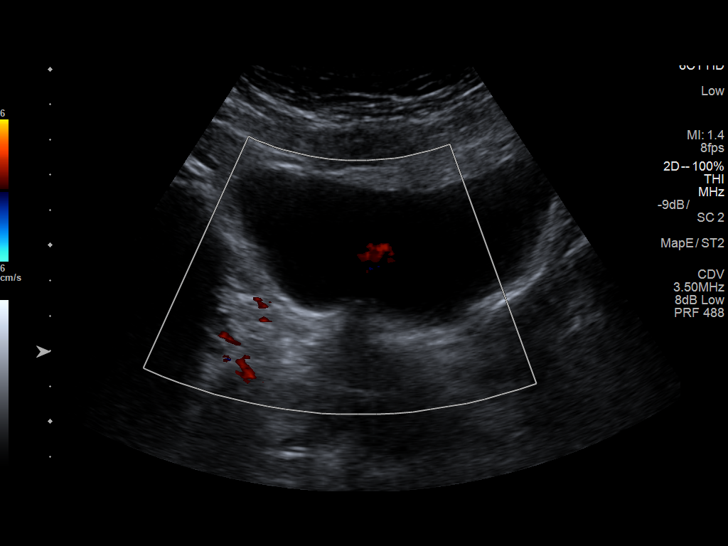

[14 of 25 positions shown; findings below may reference images not displayed]

FINDINGS: Right Kidney:

Length: 13.7 cm. Echogenicity grossly within normal limits. No
hydronephrosis. 12 mm cyst identified in the interpolar region.

Left Kidney:

Length: 11.9 cm. Echogenicity within normal limits. No mass or
hydronephrosis visualized.

Bladder:

Appears normal for degree of bladder distention.
IMPRESSION: No evidence for hydronephrosis.

## 2016-11-21 ENCOUNTER — Ambulatory Visit (HOSPITAL_BASED_OUTPATIENT_CLINIC_OR_DEPARTMENT_OTHER): Payer: 59

## 2016-11-21 DIAGNOSIS — C01 Malignant neoplasm of base of tongue: Secondary | ICD-10-CM | POA: Diagnosis not present

## 2016-11-21 DIAGNOSIS — Z452 Encounter for adjustment and management of vascular access device: Secondary | ICD-10-CM | POA: Diagnosis not present

## 2016-11-21 MED ORDER — SODIUM CHLORIDE 0.9 % IJ SOLN
10.0000 mL | INTRAMUSCULAR | Status: DC | PRN
  Administered 2016-11-21: 10 mL via INTRAVENOUS
  Filled 2016-11-21: qty 10

## 2016-11-21 MED ORDER — HEPARIN SOD (PORK) LOCK FLUSH 100 UNIT/ML IV SOLN
500.0000 [IU] | Freq: Once | INTRAVENOUS | Status: AC | PRN
  Administered 2016-11-21: 500 [IU] via INTRAVENOUS
  Filled 2016-11-21: qty 5

## 2016-12-13 MED FILL — ONDANSETRON HCL 8 MG TABLET: 8 | 30 days supply | Qty: 90 | Fill #1

## 2016-12-13 MED FILL — DOXEPIN 25 MG CAPSULE: 25 | 22 days supply | Qty: 90 | Fill #1

## 2016-12-16 DIAGNOSIS — R1314 Dysphagia, pharyngoesophageal phase: Secondary | ICD-10-CM | POA: Diagnosis not present

## 2016-12-16 DIAGNOSIS — K117 Disturbances of salivary secretion: Secondary | ICD-10-CM | POA: Diagnosis not present

## 2016-12-16 DIAGNOSIS — Y842 Radiological procedure and radiotherapy as the cause of abnormal reaction of the patient, or of later complication, without mention of misadventure at the time of the procedure: Secondary | ICD-10-CM | POA: Diagnosis not present

## 2016-12-16 DIAGNOSIS — C01 Malignant neoplasm of base of tongue: Secondary | ICD-10-CM | POA: Diagnosis not present

## 2016-12-19 ENCOUNTER — Other Ambulatory Visit: Payer: Self-pay | Admitting: Otolaryngology

## 2016-12-19 DIAGNOSIS — R1314 Dysphagia, pharyngoesophageal phase: Secondary | ICD-10-CM

## 2016-12-21 DIAGNOSIS — C44311 Basal cell carcinoma of skin of nose: Secondary | ICD-10-CM | POA: Diagnosis not present

## 2016-12-21 DIAGNOSIS — L814 Other melanin hyperpigmentation: Secondary | ICD-10-CM | POA: Diagnosis not present

## 2016-12-21 DIAGNOSIS — D485 Neoplasm of uncertain behavior of skin: Secondary | ICD-10-CM | POA: Diagnosis not present

## 2016-12-26 ENCOUNTER — Telehealth: Payer: Self-pay | Admitting: *Deleted

## 2016-12-26 NOTE — Telephone Encounter (Signed)
Called patient to inform of CT on 01-19-17 - arrival time - 10:15 am, pt. To have clear liquids only 4 hrs. Prior to test, test to be @ [facility] Radiology, and her fu on 01-20-17 @ 10 am with Dr. Isidore Moos for results, spoke with patient and she is aware of these appts.

## 2016-12-29 ENCOUNTER — Ambulatory Visit
Admission: RE | Admit: 2016-12-29 | Discharge: 2016-12-29 | Disposition: A | Discharge status: Home / Self Care | Payer: 59 | Source: Ambulatory Visit | Attending: Otolaryngology | Admitting: Otolaryngology

## 2016-12-29 DIAGNOSIS — R1314 Dysphagia, pharyngoesophageal phase: Secondary | ICD-10-CM

## 2016-12-29 DIAGNOSIS — K219 Gastro-esophageal reflux disease without esophagitis: Secondary | ICD-10-CM | POA: Diagnosis not present

## 2016-12-30 ENCOUNTER — Telehealth: Payer: Self-pay | Admitting: *Deleted

## 2016-12-30 NOTE — Telephone Encounter (Signed)
Oncology Nurse Navigator Documentation  Per VMM from patient, coordinated 4/26 CT neck with 4/12 CT Chest, called her to inform of change.  She voiced appreciation.  Gayleen Orem, RN, BSN, [place]ck Oncology Nurse [place] [facility] [place] [phone]

## 2017-01-05 ENCOUNTER — Other Ambulatory Visit: Payer: 59

## 2017-01-05 ENCOUNTER — Ambulatory Visit (HOSPITAL_COMMUNITY)
Admission: RE | Admit: 2017-01-05 | Discharge: 2017-01-05 | Disposition: A | Discharge status: Home / Self Care | Payer: Medicare Other | Source: Ambulatory Visit | Attending: Hematology and Oncology | Admitting: Hematology and Oncology

## 2017-01-05 ENCOUNTER — Other Ambulatory Visit: Payer: Self-pay

## 2017-01-05 ENCOUNTER — Other Ambulatory Visit: Payer: Self-pay | Admitting: *Deleted

## 2017-01-05 ENCOUNTER — Ambulatory Visit (HOSPITAL_BASED_OUTPATIENT_CLINIC_OR_DEPARTMENT_OTHER): Payer: 59

## 2017-01-05 DIAGNOSIS — I2510 Atherosclerotic heart disease of native coronary artery without angina pectoris: Secondary | ICD-10-CM | POA: Insufficient documentation

## 2017-01-05 DIAGNOSIS — R634 Abnormal weight loss: Secondary | ICD-10-CM

## 2017-01-05 DIAGNOSIS — C7802 Secondary malignant neoplasm of left lung: Secondary | ICD-10-CM

## 2017-01-05 DIAGNOSIS — C3432 Malignant neoplasm of lower lobe, left bronchus or lung: Secondary | ICD-10-CM | POA: Diagnosis not present

## 2017-01-05 DIAGNOSIS — C01 Malignant neoplasm of base of tongue: Secondary | ICD-10-CM | POA: Diagnosis not present

## 2017-01-05 DIAGNOSIS — Y842 Radiological procedure and radiotherapy as the cause of abnormal reaction of the patient, or of later complication, without mention of misadventure at the time of the procedure: Secondary | ICD-10-CM | POA: Insufficient documentation

## 2017-01-05 DIAGNOSIS — I700 Atherosclerosis of aorta: Secondary | ICD-10-CM | POA: Diagnosis not present

## 2017-01-05 DIAGNOSIS — Z923 Personal history of irradiation: Secondary | ICD-10-CM | POA: Insufficient documentation

## 2017-01-05 DIAGNOSIS — R918 Other nonspecific abnormal finding of lung field: Secondary | ICD-10-CM | POA: Diagnosis not present

## 2017-01-05 DIAGNOSIS — Z79899 Other long term (current) drug therapy: Secondary | ICD-10-CM

## 2017-01-05 DIAGNOSIS — C029 Malignant neoplasm of tongue, unspecified: Secondary | ICD-10-CM | POA: Diagnosis not present

## 2017-01-05 LAB — COMPREHENSIVE METABOLIC PANEL
ALT: 31 U/L (ref 0–55)
AST: 33 U/L (ref 5–34)
Albumin: 3.8 g/dL (ref 3.5–5.0)
Alkaline Phosphatase: 85 U/L (ref 40–150)
Anion Gap: 8 mEq/L (ref 3–11)
BUN: 32.2 mg/dL — ABNORMAL HIGH (ref 7.0–26.0)
CHLORIDE: 103 meq/L (ref 98–109)
CO2: 29 meq/L (ref 22–29)
Calcium: 9.2 mg/dL (ref 8.4–10.4)
Creatinine: 0.9 mg/dL (ref 0.6–1.1)
EGFR: 63 mL/min/{1.73_m2} — AB (ref >90)
GLUCOSE: 125 mg/dL (ref 70–140)
POTASSIUM: 4 meq/L (ref 3.5–5.1)
SODIUM: 139 meq/L (ref 136–145)
TOTAL PROTEIN: 6 g/dL — AB (ref 6.4–8.3)
Total Bilirubin: 0.5 mg/dL (ref 0.20–1.20)

## 2017-01-05 LAB — TSH: TSH: 2.786 m(IU)/L (ref 0.308–3.960)

## 2017-01-05 MED ORDER — SODIUM CHLORIDE 0.9 % IJ SOLN
10.0000 mL | INTRAMUSCULAR | Status: DC | PRN
  Administered 2017-01-05: 10 mL via INTRAVENOUS
  Filled 2017-01-05: qty 10

## 2017-01-05 MED ORDER — IOPAMIDOL (ISOVUE-300) INJECTION 61%
INTRAVENOUS | Status: AC
Start: 2017-01-05 — End: 2017-01-05
  Filled 2017-01-05: qty 75

## 2017-01-05 MED ORDER — IOPAMIDOL (ISOVUE-300) INJECTION 61%
INTRAVENOUS | Status: AC
  Filled 2017-01-05: qty 75

## 2017-01-05 MED ORDER — HEPARIN SOD (PORK) LOCK FLUSH 100 UNIT/ML IV SOLN
500.0000 [IU] | Freq: Once | INTRAVENOUS | Status: AC
  Administered 2017-01-05: 500 [IU] via INTRAVENOUS

## 2017-01-05 MED ORDER — IOPAMIDOL (ISOVUE-300) INJECTION 61%
75.0000 mL | Freq: Once | INTRAVENOUS | Status: AC | PRN
  Administered 2017-01-05: 75 mL via INTRAVENOUS

## 2017-01-05 MED ORDER — HEPARIN SOD (PORK) LOCK FLUSH 100 UNIT/ML IV SOLN
INTRAVENOUS | Status: AC
  Filled 2017-01-05: qty 5

## 2017-01-06 ENCOUNTER — Encounter: Payer: Self-pay | Admitting: Hematology and Oncology

## 2017-01-06 ENCOUNTER — Telehealth: Payer: Self-pay | Admitting: Hematology and Oncology

## 2017-01-06 ENCOUNTER — Ambulatory Visit (HOSPITAL_BASED_OUTPATIENT_CLINIC_OR_DEPARTMENT_OTHER): Payer: 59 | Admitting: Hematology and Oncology

## 2017-01-06 DIAGNOSIS — R1310 Dysphagia, unspecified: Secondary | ICD-10-CM | POA: Diagnosis not present

## 2017-01-06 DIAGNOSIS — C01 Malignant neoplasm of base of tongue: Secondary | ICD-10-CM | POA: Diagnosis not present

## 2017-01-06 DIAGNOSIS — C7802 Secondary malignant neoplasm of left lung: Secondary | ICD-10-CM | POA: Diagnosis not present

## 2017-01-06 MED ORDER — HYDROMORPHONE HCL 4 MG PO TABS: 4.0000 mg | ORAL_TABLET | ORAL | 0 refills | Status: DC | PRN

## 2017-01-06 MED ORDER — ONDANSETRON HCL 8 MG PO TABS: 8.0000 mg | ORAL_TABLET | Freq: Three times a day (TID) | ORAL | 6 refills | Status: DC | PRN

## 2017-01-06 MED ORDER — DOXEPIN HCL 25 MG PO CAPS: ORAL_CAPSULE | ORAL | 3 refills | Status: DC

## 2017-01-06 MED FILL — ONDANSETRON HCL 8 MG TABLET: 8 | 30 days supply | Qty: 90 | Fill #0

## 2017-01-06 MED FILL — HYDROmorphone HCL 4 MG TABS: 4 | 15 days supply | Qty: 90 | Fill #0

## 2017-01-06 MED FILL — DOXEPIN 25 MG CAPSULE: 25 | 22 days supply | Qty: 90 | Fill #0

## 2017-01-06 NOTE — Telephone Encounter (Signed)
Gave patient AVS and calender per 4/13 los.  

## 2017-01-06 NOTE — Assessment & Plan Note (Signed)
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  I would defer to the ENT for further management.

## 2017-01-06 NOTE — Assessment & Plan Note (Signed)
She has completed recent treatment for metastatic cancer to the lung. Repeat CT scan of the head and neck and chest show no evidence of disease progression or new lesions In the meantime, she is asymptomatic. Continue supportive care She remain at high risk for recurrence in the first 2 years I recommend close ENT follow-up I plan to see her back again in 3 months with repeat imaging study

## 2017-01-06 NOTE — Progress Notes (Signed)
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 3 cm right tongue base mass consistent with malignancy.2. Necrotic right level II and III nodal metastases.3. Enlarged right thyroid lobe containing multiple nodules.      10/14/2015 Imaging    PET scan showed 1. Hypermetabolic right tongue base mass with hypermetabolic right level II and III adenopathy. Small left level III lymph node appears mildly hypermetabolic as well.2. A few scattered tiny pulmonary nodules are too small for PETresolution      10/16/2015 Imaging    [place] Head/Neck:  1) Multiple thyroid lesions bilaterally; dominant nodule in right lower pole measures 22 mm and meets fine-needle aspiration criteria. 2) Complex mass within the soft tissues of the right side of neck worrisome for metastatic malignancy.      10/26/2015 Pathology Results    Accession [id number]:  Thyroid FNA - Consistent with benign follicular nodule.      10/28/2015 Surgery    Multiple tooth extractions (26), right tongue base biopsy.      10/28/2015 Pathology Results    Accession [id number]:  Tongue, right base -  Infiltrative SCC, p16 positive.   Actinomyces infection.      11/05/2015 Procedure    Port-a-cath and PEG placed.      11/11/2015 - 01/05/2016 Radiation Therapy    She received radiation      11/11/2015 - 11/12/2015 Chemotherapy    She received high dose cisplatin. Treatment was discontinued permanently due to severe side-effects      11/13/2015 Adverse Reaction    She presented with intractable nausea and vomiting      11/13/2015 - 11/30/2015 Hospital Admission    She was hospitalized for intractable nausea, vomiting and developed acute renal failure, reversed with aggressive IV fluid hydration. Subsequently, she was found to have intra-abdominal infection with MRSA requiring placement of drainage tube and IV ABx      11/24/2015 Imaging    CT scan showed large abdominal abscess      11/25/2015 Procedure    She had placement of drain      12/08/2015 Procedure    Intra-abdominal drain was removed      12/08/2015 Imaging    CT abdomen showed resolution of abscess and a new left lower base lung nodules      12/31/2015 Imaging    Enlarging left lower lobe pulmonary nodule. This could represent a hematogenous metastasis although the rate of enlargement is faster than typical.      02/16/2016 Imaging    Pulmonary nodule in the left lower lobe has increased in size from 12/08/2015. This is a  new finding when compared with 10/13/2014. Findings are worrisome for progression of pulmonary metastasis      03/14/2016 -  Radiation Therapy    She has SBRT to lung lesion      10/07/2016 Imaging    CT neck showed stable exam compared to 07/07/2016, including the persistently enlarged right jugulodigastric node which was characterized on PET-CT 04/05/2016. No evidence of new or progressive disease      10/07/2016 Imaging    Continued decrease in size of the left lower lobe pulmonary nodule which now measures 5 x 5 mm and is surrounded by radiation changes. 2. Stable small right upper lobe pulmonary nodules. No new pulmonary  lesions. 3. Stable biapical pleural and parenchymal scarring changes. 4. No mediastinal or hilar mass or adenopathy. 5. Stable multinodular right thyroid goiter. 6. No findings for upper abdominal metastatic disease. Stable left adrenal gland adenoma.      12/29/2016 Imaging    Barium swallow shows a long segment of narrowing in the cervical esophagus which would not pass a 13 mm tablet. Also reflux       01/05/2017 Imaging    Ct chest: Radiation change in the left lower lobe is similar. The previously described 5 mm pulmonary nodule is difficult to differentiate from surrounding radiation change. 2. Similar right-sided pulmonary nodules. No enlarging or suspicious pulmonary nodule identified. 3. Coronary artery atherosclerosis. Aortic atherosclerosis.       INTERVAL HISTORY: Please see below for problem oriented charting. She returns with her daughter She is doing well Her pain is stable with current prescription pain medicine She has mild constipation, resolved laxative She is able to swallow some pills and liquids She does not have meaningful intake through the mouth.  She is dependent on feeding tube for full nutritional needs She denies problem with the feeding tube Denies recent cough, chest pain or shortness of breath  REVIEW OF SYSTEMS:   Constitutional: Denies fevers, chills or abnormal weight loss Eyes: Denies blurriness of vision Ears, nose, mouth, throat, and face: Denies mucositis or sore throat Respiratory: Denies cough, dyspnea or wheezes Cardiovascular: Denies palpitation, chest discomfort or lower extremity swelling Gastrointestinal:  Denies nausea, heartburn or change in bowel habits Skin: Denies abnormal skin rashes Lymphatics: Denies new lymphadenopathy or easy bruising Neurological:Denies numbness, tingling or new weaknesses Behavioral/Psych: Mood is stable, no new changes  All other systems were reviewed with the patient and are negative.  I have reviewed  the past medical history, past surgical history, social history and family history with the patient and they are unchanged from previous note.  ALLERGIES:  is allergic to morphine and related.  MEDICATIONS:  Current Outpatient Prescriptions  Medication Sig Dispense Refill  . Amino Acids-Protein Hydrolys (FEEDING SUPPLEMENT, PRO-STAT SUGAR FREE 64,) LIQD Place 30 mLs into feeding tube daily. 900 mL 0  . doxepin (SINEQUAN) 25 MG capsule TAKE 1 CAPSULE BY MOUTH EVERY 6 HOURS 90 capsule 3  . HYDROmorphone (DILAUDID) 4 MG tablet Take 1 tablet (4 mg total) by mouth every 4 (four) hours as needed for severe pain. 90 tablet 0  . Nutritional Supplements (FEEDING SUPPLEMENT, OSMOLITE 1.5 CAL,) LIQD Change Osmolite 1.5 or equivalent to bolus feedings of 7 cans daily. Give 2 cans TID and one can once daily. Give 120 cc free water before and after bolus feedings. Continue 45 mL Promod once daily. Give additional 500 cc free water daily via tube or by mouth as tolerated. 1659 mL 0  . ondansetron (ZOFRAN) 8 MG  tablet Take 1 tablet (8 mg total) by mouth every 8 (eight) hours as needed for nausea or vomiting. 90 tablet 6  . Water For Irrigation, Sterile (FREE WATER) SOLN Place 100 mLs into feeding tube 4 (four) times daily.     No current facility-administered medications for this visit.     PHYSICAL EXAMINATION: ECOG PERFORMANCE STATUS: 1 - Symptomatic but completely ambulatory  Vitals:   01/06/17 0840  BP: (!) 143/53  Pulse: 80  Resp: 18  Temp: 98.1 F (36.7 C)   Filed Weights   01/06/17 0840  Weight: 132 lb (59.9 kg)    GENERAL:alert, no distress and comfortable SKIN: skin color, texture, turgor are normal, no rashes or significant lesions EYES: normal, Conjunctiva are pink and non-injected, sclera clear OROPHARYNX:no exudate, no erythema and lips, buccal mucosa, and tongue normal  NECK: supple, thyroid normal size, non-tender, without nodularity LYMPH:  no palpable lymphadenopathy in the  cervical, axillary or inguinal LUNGS: clear to auscultation and percussion with normal breathing effort HEART: regular rate & rhythm and no murmurs and no lower extremity edema ABDOMEN:abdomen soft, non-tender and normal bowel sounds Musculoskeletal:no cyanosis of digits and no clubbing  NEURO: alert & oriented x 3 with fluent speech, no focal motor/sensory deficits  LABORATORY DATA:  I have reviewed the data as listed    Component Value Date/Time   NA 139 01/05/2017 1002   K 4.0 01/05/2017 1002   CL 94 (L) 01/05/2016 0445   CO2 29 01/05/2017 1002   GLUCOSE 125 01/05/2017 1002   BUN 32.2 (H) 01/05/2017 1002   CREATININE 0.9 01/05/2017 1002   CALCIUM 9.2 01/05/2017 1002   PROT 6.0 (L) 01/05/2017 1002   ALBUMIN 3.8 01/05/2017 1002   AST 33 01/05/2017 1002   ALT 31 01/05/2017 1002   ALKPHOS 85 01/05/2017 1002   BILITOT 0.50 01/05/2017 1002   GFRNONAA >60 01/05/2016 0445   GFRAA >60 01/05/2016 0445    No results found for: SPEP, UPEP  Lab Results  Component Value Date   WBC 4.2 10/10/2016   NEUTROABS 3.2 10/10/2016   HGB 8.1 (L) 10/10/2016   HCT 25.6 (L) 10/10/2016   MCV 67.4 (L) 10/10/2016   PLT 204 10/10/2016      Chemistry      Component Value Date/Time   NA 139 01/05/2017 1002   K 4.0 01/05/2017 1002   CL 94 (L) 01/05/2016 0445   CO2 29 01/05/2017 1002   BUN 32.2 (H) 01/05/2017 1002   CREATININE 0.9 01/05/2017 1002      Component Value Date/Time   CALCIUM 9.2 01/05/2017 1002   ALKPHOS 85 01/05/2017 1002   AST 33 01/05/2017 1002   ALT 31 01/05/2017 1002   BILITOT 0.50 01/05/2017 1002       RADIOGRAPHIC STUDIES: I have personally reviewed the radiological images as listed and agreed with the findings in the report. Ct Soft Tissue Neck W Contrast  Result Date: 01/05/2017 CLINICAL DATA:  Carcinoma of the tongue. Previous radiotherapy. Restaging. EXAM: CT NECK WITH CONTRAST TECHNIQUE: Multidetector CT imaging of the neck was performed using the standard  protocol following the bolus administration of intravenous contrast. CONTRAST:  53m ISOVUE-300 IOPAMIDOL (ISOVUE-300) INJECTION 61% COMPARISON:  10/07/2016.  07/07/2016. FINDINGS: Pharynx and larynx: No evidence of developing mucosal or submucosal lesion. Chronic post treatment changes of increased density in the pre epiglottic fat, presumably secondary to radiotherapy. Salivary glands: Submandibular atrophy right more than left. Parotid glands are normal. Thyroid: Chronic benign thyroid nodules, largest  on the right maximal diameter 3.1 cm. Lymph nodes: Chronic treated level 2/level 3 node on the right with maximal transverse dimension of 10 x 15 mm today. Previous measurement was 10 x 18 mm. No new or enlarging nodes. Vascular: Arterial and venous structures are patent. No significant carotid atherosclerotic disease. Limited intracranial: Normal Visualized orbits: Normal Mastoids and visualized paranasal sinuses: Clear Skeleton: Chronic cervical spondylosis. Upper chest: See results of chest CT. Other: None significant IMPRESSION: No evidence of worsening disease in the neck. Post radiation changes in the supraglottic region Previously treated right level 2/level 3 node. Maximal transverse diameter today 10 x 15 mm, reduced from 10 x 18 mm on the previous scan. Electronically Signed   By: Nelson Chimes M.D.   On: 01/05/2017 11:35   Ct Chest W Contrast  Result Date: 01/05/2017 CLINICAL DATA:  tongue cancer with lung metastasis in 2016. Radiation therapy in 2017. EXAM: CT CHEST WITH CONTRAST TECHNIQUE: Multidetector CT imaging of the chest was performed during intravenous contrast administration. CONTRAST:  21m ISOVUE-300 IOPAMIDOL (ISOVUE-300) INJECTION 61% COMPARISON:  10/07/2016 FINDINGS: Cardiovascular: Right Port-A-Cath which terminates at the superior caval/ atrial junction. Aortic and branch vessel atherosclerosis. Normal heart size, without pericardial effusion. No central pulmonary embolism, on this  non-dedicated study. Mediastinum/Nodes: Right-sided thyroid enlargement of multiple hypoattenuating lesions within, similar. No mediastinal or hilar adenopathy. Lungs/Pleura: No pleural fluid. Significant biapical pleural-parenchymal scarring, similar. Subpleural right upper lobe 2 mm nodule on image 62/series 8, similar. A subpleural right lower lobe 4 mm nodule is similar on image 99/series 8. Right lower lobe 4 mm nodule on image 83/series 8 is also unchanged. similar appearance of radiation induced consolidation and ground-glass opacity in the left lower lobe. The previously described nodule within may be identified at 5 mm on image 126/series 8. Upper Abdomen: Normal imaged portions of the liver, pancreas, adrenal glands, and kidneys. Gastrostomy tube is appropriately positioned. Subcentimeter hypoattenuating splenic lesion is of doubtful clinical significance. Musculoskeletal: No acute osseous abnormality. IMPRESSION: 1. Radiation change in the left lower lobe is similar. The previously described 5 mm pulmonary nodule is difficult to differentiate from surrounding radiation change. 2. Similar right-sided pulmonary nodules. No enlarging or suspicious pulmonary nodule identified. 3.  Coronary artery atherosclerosis. Aortic atherosclerosis. Electronically Signed   By: KAbigail MiyamotoM.D.   On: 01/05/2017 14:19   Dg Esophagus  Result Date: 12/29/2016 CLINICAL DATA:  Pharyngo esophageal dysphagia, history of cancer of the base of the tongue with radiation therapy of the base of the tongue and neck EXAM: ESOPHOGRAM/BARIUM SWALLOW TECHNIQUE: Single contrast examination was performed using  thin barium. FLUOROSCOPY TIME:  Fluoroscopy Time:  1 minutes 36 seconds Radiation Exposure Index (if provided by the fluoroscopic device): 84 mGy Number of Acquired Spot Images: 0 COMPARISON:  CT head and neck of 10/07/2016 FINDINGS: Initially rapid sequence spot films of the cervical esophagus were obtained. There is persistent  narrowing of the mid lower cervical esophagus most likely due to fibrosis from radiation therapy. No intrinsic esophageal lesion is seen. Otherwise esophageal peristalsis is normal. No hiatal hernia is seen. There is moderate gastroesophageal reflux demonstrated. A barium pill was given at the end of the study which became lodged in the mid cervical esophagus and did not pass before dissolving. IMPRESSION: 1. 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 2. The barium pill lodges at the level of the cervical esophageal narrowing and does not pass before dissolving. 3. Moderate gastroesophageal reflux. Electronically Signed  By: Ivar Drape M.D.   On: 12/29/2016 11:24    ASSESSMENT & PLAN:  Cancer of base of tongue ([place]) She has completed recent treatment for metastatic cancer to the lung. Repeat CT scan of the head and neck and chest show no evidence of disease progression or new lesions In the meantime, she is asymptomatic. Continue supportive care She remain at high risk for recurrence in the first 2 years I recommend close ENT follow-up I plan to see her back again in 3 months with repeat imaging study  Metastasis to lung [facility]) She is not symptomatic. 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  I would defer to the ENT for further management.   Orders Placed This Encounter  Procedures  . CT CHEST W CONTRAST    Standing Status:   Future    Standing Expiration Date:   03/08/2018    Order Specific Question:   Reason for Exam (SYMPTOM  OR DIAGNOSIS REQUIRED)    Answer:   lung mets, check for progression    Order Specific Question:   Preferred imaging location?    Answer:    [facility]  . CBC with Differential/Platelet    Standing Status:   Future    Standing Expiration Date:   03/08/2018  . Comprehensive metabolic panel    Standing Status:   Future    Standing Expiration Date:   03/08/2018   All questions were answered. The patient knows to call the clinic with any problems, questions or concerns. No barriers to learning was detected. I spent 15 minutes counseling the patient face to face. The total time spent in the appointment was 20 minutes and more than 50% was on counseling and review of test results     [name], MD 01/06/2017 8:54 AM

## 2017-01-06 NOTE — Assessment & Plan Note (Signed)
She is not symptomatic. Plan to repeat imaging study in 3 months to assess response to treatment and to exclude disease progression In the meantime, I recommend we keep her port patent for at least 2 years

## 2017-01-10 DIAGNOSIS — C44311 Basal cell carcinoma of skin of nose: Secondary | ICD-10-CM | POA: Diagnosis not present

## 2017-01-10 DIAGNOSIS — Z85828 Personal history of other malignant neoplasm of skin: Secondary | ICD-10-CM | POA: Diagnosis not present

## 2017-01-10 MED FILL — MUPIROCIN 2% OINTMENT: 2 | 14 days supply | Qty: 22 | Fill #0

## 2017-01-16 NOTE — Progress Notes (Signed)
[name] presents for follow up of radiation completed 01/05/16 to her Base of Tongue and bilateral neck. She also received Left lower lung SBRT which completed 03/23/16.   Pain issues, if any: She denies Using a feeding tube?: Yes, she is instilling 6 cans of osmolite daily. If she attempts to instill more that this she becomes sick.  Weight changes, if any:  Wt Readings from Last 3 Encounters:  01/20/17 130 lb 12.8 oz (59.3 kg)  01/06/17 132 lb (59.9 kg)  10/10/16 137 lb 9.6 oz (62.4 kg)   Swallowing issues, if any: Food is getting stuck, she is able to swallow thin liquids and pills. She is planning to have an esophageal dilation? Scheduled soon through Dr. Noreene Filbert office. She tells me that there is something blocking her throat .  Smoking or chewing tobacco? No Using fluoride trays daily? No Last ENT visit was on: Dr. Erik Obey 2/95/62 Other notable issues, if any:  Dr. Alvy Bimler 01/06/17 CT Chest 01/05/17  BP (!) 146/64   Pulse 72   Temp 98.4 F (36.9 C)   Ht '5\' 6"'$  (1.676 m)   Wt 130 lb 12.8 oz (59.3 kg)   SpO2 100% Comment: room air  BMI 21.11 kg/m

## 2017-01-18 DIAGNOSIS — C44311 Basal cell carcinoma of skin of nose: Secondary | ICD-10-CM | POA: Diagnosis not present

## 2017-01-18 DIAGNOSIS — Z85828 Personal history of other malignant neoplasm of skin: Secondary | ICD-10-CM | POA: Diagnosis not present

## 2017-01-18 NOTE — Progress Notes (Signed)
Radiation Oncology         [phone] ________________________________  Name: Debra Farrell MRN: [id number]  Date: 01/20/2017  DOB: [birth year]  Follow-Up Visit Note  CC: Rodrigo Ran, MD  Flo Shanks, MD  Diagnosis and Prior Radiotherapy:       ICD-9-CM ICD-10-CM   1. Malignant neoplasm of lower lobe of left lung (HCC) 162.5 C34.32   2. 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  The patient underwent CT chest on 01/05/17 revealing radiation change in the left lower lobe. The previously described 5 mm pulmonary nodule is difficult to differentiate from surrounding radiation change.  CT neck on the same day showed no evidence of worsening disease in the neck - right cervical mass still shrinking.. I reviewed the images.  On review of systems, the patient denies pain. She is using a feeding tube and instilling 6 cans of osmolite daily. She reports if she attempts to instill more she becomes sick. She reports food is getting stuck, though she is able to swallow liquids and pills. She is scheduled to see Dr. Lazarus Salines soon about this.  Will reach out to [facility] today for an appt.  The patient is not using tobacco products at this time. Of note, she recently had skin cancer removed from her nose.    ALLERGIES:  is allergic to morphine and related.  Meds: Current Outpatient Prescriptions  Medication Sig Dispense Refill  . Amino Acids-Protein Hydrolys (FEEDING SUPPLEMENT, PRO-STAT SUGAR FREE 64,)  LIQD Place 30 mLs into feeding tube daily. 900 mL 0  . doxepin (SINEQUAN) 25 MG capsule TAKE 1 CAPSULE BY MOUTH EVERY 6 HOURS 90 capsule 3  . HYDROmorphone (DILAUDID) 4 MG tablet Take 1 tablet (4 mg total) by mouth every 4 (four) hours as needed for severe pain. 90 tablet 0  . Nutritional Supplements (FEEDING SUPPLEMENT, OSMOLITE 1.5 CAL,) LIQD Change Osmolite 1.5 or equivalent to bolus feedings of 7 cans daily. Give 2 cans TID and one can once daily. Give 120 cc free water before and after bolus feedings. Continue 45 mL Promod once daily. Give additional 500 cc free water daily via tube or by mouth as tolerated. 1659 mL 0  . ondansetron (ZOFRAN) 8 MG tablet Take 1 tablet (8 mg total) by mouth every 8 (eight) hours as needed for nausea or vomiting. 90 tablet 6  . Water For Irrigation, Sterile (FREE WATER) SOLN Place 100 mLs into feeding tube 4 (four) times daily.     No current facility-administered medications for this encounter.     Physical Findings: The patient is in no acute distress. Patient is alert and oriented.  height is 5\' 6"  (1.676 m) and weight is 130 lb 12.8 oz (59.3 kg). Her temperature is 98.4 F (36.9 C). Her blood pressure is 146/64 (abnormal) and her pulse is 72. Her oxygen saturation is 100%.  General: Alert and oriented, in no acute distress. HEENT: Oropharynx  is clear, no oropharyngeal lesions. Mucous membranes are somewhat dry. Neck: Not able to appreciate any palpable masses or lymphadenopathy in the cervical or supraclavicular neck. Skin: Some hyperpigmentation over the skin of the neck. Heart: Regular in rate and rhythm with no murmurs. Chest: Clear to auscultation bilaterally. Abdomen: PEG site is clear  Lymphatics: see Neck Exam Psychiatric: Judgment and insight are intact. Affect is appropriate.   Lab Findings: Lab Results  Component Value Date   WBC 4.2 10/10/2016   HGB 8.1 (L) 10/10/2016   HCT 25.6 (L) 10/10/2016   MCV 67.4 (L) 10/10/2016   PLT 204  10/10/2016    Lab Results  Component Value Date   TSH 2.786 01/05/2017   CMP     Component Value Date/Time   NA 139 01/05/2017 1002   K 4.0 01/05/2017 1002   CL 94 (L) 01/05/2016 0445   CO2 29 01/05/2017 1002   GLUCOSE 125 01/05/2017 1002   BUN 32.2 (H) 01/05/2017 1002   CREATININE 0.9 01/05/2017 1002   CALCIUM 9.2 01/05/2017 1002   PROT 6.0 (L) 01/05/2017 1002   ALBUMIN 3.8 01/05/2017 1002   AST 33 01/05/2017 1002   ALT 31 01/05/2017 1002   ALKPHOS 85 01/05/2017 1002   BILITOT 0.50 01/05/2017 1002   GFRNONAA >60 01/05/2016 0445   GFRAA >60 01/05/2016 0445     Radiographic Findings: Ct Soft Tissue Neck W Contrast  Result Date: 01/05/2017 CLINICAL DATA:  Carcinoma of the tongue. Previous radiotherapy. Restaging. EXAM: CT NECK WITH CONTRAST TECHNIQUE: Multidetector CT imaging of the neck was performed using the standard protocol following the bolus administration of intravenous contrast. CONTRAST:  75mL ISOVUE-300 IOPAMIDOL (ISOVUE-300) INJECTION 61% COMPARISON:  10/07/2016.  07/07/2016. FINDINGS: Pharynx and larynx: No evidence of developing mucosal or submucosal lesion. Chronic post treatment changes of increased density in the pre epiglottic fat, presumably secondary to radiotherapy. Salivary glands: Submandibular atrophy right more than left. Parotid glands are normal. Thyroid: Chronic benign thyroid nodules, largest on the right maximal diameter 3.1 cm. Lymph nodes: Chronic treated level 2/level 3 node on the right with maximal transverse dimension of 10 x 15 mm today. Previous measurement was 10 x 18 mm. No new or enlarging nodes. Vascular: Arterial and venous structures are patent. No significant carotid atherosclerotic disease. Limited intracranial: Normal Visualized orbits: Normal Mastoids and visualized paranasal sinuses: Clear Skeleton: Chronic cervical spondylosis. Upper chest: See results of chest CT. Other: None significant IMPRESSION: No evidence of worsening disease in  the neck. Post radiation changes in the supraglottic region Previously treated right level 2/level 3 node. Maximal transverse diameter today 10 x 15 mm, reduced from 10 x 18 mm on the previous scan. Electronically Signed   By: Paulina Fusi M.D.   On: 01/05/2017 11:35   Ct Chest W Contrast  Result Date: 01/05/2017 CLINICAL DATA:  tongue cancer with lung metastasis in 2016. Radiation therapy in 2017. EXAM: CT CHEST WITH CONTRAST TECHNIQUE: Multidetector CT imaging of the chest was performed during intravenous contrast administration. CONTRAST:  75mL ISOVUE-300 IOPAMIDOL (ISOVUE-300) INJECTION 61% COMPARISON:  10/07/2016 FINDINGS: Cardiovascular: Right Port-A-Cath which terminates at the superior caval/ atrial junction. Aortic and branch vessel atherosclerosis. Normal heart size, without pericardial effusion. No central pulmonary embolism, on this non-dedicated study. Mediastinum/Nodes: Right-sided thyroid enlargement of multiple hypoattenuating lesions within, similar. No mediastinal or hilar adenopathy. Lungs/Pleura: No pleural fluid. Significant biapical pleural-parenchymal scarring, similar. Subpleural right upper lobe 2 mm nodule on image 62/series 8, similar. A subpleural right lower lobe 4  mm nodule is similar on image 99/series 8. Right lower lobe 4 mm nodule on image 83/series 8 is also unchanged. similar appearance of radiation induced consolidation and ground-glass opacity in the left lower lobe. The previously described nodule within may be identified at 5 mm on image 126/series 8. Upper Abdomen: Normal imaged portions of the liver, pancreas, adrenal glands, and kidneys. Gastrostomy tube is appropriately positioned. Subcentimeter hypoattenuating splenic lesion is of doubtful clinical significance. Musculoskeletal: No acute osseous abnormality. IMPRESSION: 1. Radiation change in the left lower lobe is similar. The previously described 5 mm pulmonary nodule is difficult to differentiate from surrounding  radiation change. 2. Similar right-sided pulmonary nodules. No enlarging or suspicious pulmonary nodule identified. 3.  Coronary artery atherosclerosis. Aortic atherosclerosis. Electronically Signed   By: Jeronimo Greaves M.D.   On: 01/05/2017 14:19   Dg Esophagus  Result Date: 12/29/2016 CLINICAL DATA:  Pharyngo esophageal dysphagia, history of cancer of the base of the tongue with radiation therapy of the base of the tongue and neck EXAM: ESOPHOGRAM/BARIUM SWALLOW TECHNIQUE: Single contrast examination was performed using  thin barium. FLUOROSCOPY TIME:  Fluoroscopy Time:  1 minutes 36 seconds Radiation Exposure Index (if provided by the fluoroscopic device): 84 mGy Number of Acquired Spot Images: 0 COMPARISON:  CT head and neck of 10/07/2016 FINDINGS: Initially rapid sequence spot films of the cervical esophagus were obtained. There is persistent narrowing of the mid lower cervical esophagus most likely due to fibrosis from radiation therapy. No intrinsic esophageal lesion is seen. Otherwise esophageal peristalsis is normal. No hiatal hernia is seen. There is moderate gastroesophageal reflux demonstrated. A barium pill was given at the end of the study which became lodged in the mid cervical esophagus and did not pass before dissolving. IMPRESSION: 1. 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 2. The barium pill lodges at the level of the cervical esophageal narrowing and does not pass before dissolving. 3. Moderate gastroesophageal reflux. Electronically Signed   By: Dwyane Dee M.D.   On: 12/29/2016 11:24    Impression/Plan:   1) Head and Neck Cancer Status: No evidence of recurrent or progressive disease. Doing very well ,especially with history of oligometastatic disease.  2) Nutritional Status: lost 7 lbs since January PEG tube: still using  Encouraged to follow up with Nutrition as she wishes - today.  3) Risk Factors: The patient has been educated about risk  factors including alcohol and tobacco abuse; they understand that avoidance of alcohol and tobacco is important to prevent recurrences as well as other cancers; abstaining from tobacco.   4) Swallowing: Esophageal delectation is scheduled with Dr. Lazarus Salines due to food retention.   5) Dental: edentulous - getting dentures soon  6) Thyroid function: Within normal limits; continue to check every 6-12 months in medical oncology. Lab Results  Component Value Date   TSH 2.786 01/05/2017   Dr. Bertis Ruddy is following the patient closely, and will be seeing the patient again in 3 months. I'll see her back on an as needed basis. We discussed self-care and setting boundaries with people in her life who are "taking advantage of her" per her daughter.  She knows SW is a Theatre stage manager, here, to see PRN. _______________________________________________  Lonie Peak, MD  This document serves as a record of services personally performed by Lonie Peak, MD. It was created on her behalf by Eustace Moore, a trained medical scribe. The creation of this record is based on the scribe's personal observations and the  provider's statements to them. This document has been checked and approved by the attending provider.

## 2017-01-19 ENCOUNTER — Ambulatory Visit (HOSPITAL_COMMUNITY): Payer: 59

## 2017-01-20 ENCOUNTER — Ambulatory Visit
Admission: RE | Admit: 2017-01-20 | Discharge: 2017-01-20 | Disposition: A | Discharge status: Home / Self Care | Payer: Medicare Other | Source: Ambulatory Visit | Attending: Radiation Oncology | Admitting: Radiation Oncology

## 2017-01-20 ENCOUNTER — Encounter: Payer: Self-pay | Admitting: Radiation Oncology

## 2017-01-20 ENCOUNTER — Ambulatory Visit: Payer: Medicare Other | Admitting: Nutrition

## 2017-01-20 VITALS — BP 146/64 | HR 72 | Temp 98.4°F | Ht 66.0 in | Wt 130.8 lb

## 2017-01-20 DIAGNOSIS — Z885 Allergy status to narcotic agent status: Secondary | ICD-10-CM | POA: Diagnosis not present

## 2017-01-20 DIAGNOSIS — Z931 Gastrostomy status: Secondary | ICD-10-CM | POA: Insufficient documentation

## 2017-01-20 DIAGNOSIS — C01 Malignant neoplasm of base of tongue: Secondary | ICD-10-CM | POA: Insufficient documentation

## 2017-01-20 DIAGNOSIS — C3432 Malignant neoplasm of lower lobe, left bronchus or lung: Secondary | ICD-10-CM | POA: Insufficient documentation

## 2017-01-20 DIAGNOSIS — Z79899 Other long term (current) drug therapy: Secondary | ICD-10-CM | POA: Diagnosis not present

## 2017-01-20 DIAGNOSIS — Z08 Encounter for follow-up examination after completed treatment for malignant neoplasm: Secondary | ICD-10-CM | POA: Diagnosis not present

## 2017-01-20 DIAGNOSIS — Z85118 Personal history of other malignant neoplasm of bronchus and lung: Secondary | ICD-10-CM | POA: Diagnosis not present

## 2017-01-20 MED FILL — AZITHROMYCIN 250 MG TABLET: 250 | 1 days supply | Qty: 2 | Fill #0

## 2017-01-20 NOTE — Progress Notes (Signed)
Patient stopped by my office to report continued weight loss. Current tube feedings via PEG utilizing Osmolite 1.5-6 cans daily. Current weight documented as 130.8 pounds decreased from 151.3 pounds on August 18. Patient reports she is very active and appears to be burning more calories than anticipated. She cannot tolerate more than 6 cans daily through her feeding tube. She is unable to eat by mouth.  She is getting dental implants and will be scheduling an esophageal dilatation.  Nutrition diagnosis:  Unintended weight loss related to less than optimal enteral nutrition as evidenced by 14% weight loss over 7 months.  Revised estimated nutrition needs:  2400-2600 calories, 90-125 grams protein, 2.6 L fluid.  Intervention: Change tube feeding to TwoCal HN, one can 5 times a day with 60 mL free water before and after each bolus feeding. In addition patient will continue Protostat 30 mL once daily, via feeding tube. Patient will consume a minimum of 5 - 8 ounce glasses of water daily.   Tube feeding plus free water provides 2475 cal, 114.5 g protein, 2630 mL free water. New orders were written and advanced homecare notified. Teach back method used.  Monitoring, evaluation, goals: Patient will tolerate new tube feeding with protein to minimize further weight loss.  Patient will contact me for questions or concerns.  **Disclaimer: This note was dictated with voice recognition software. Similar sounding words can inadvertently be transcribed and this note may contain transcription errors which may not have been corrected upon publication of note.**

## 2017-01-20 NOTE — Progress Notes (Signed)
Patient came to IR today requesting a new pull thru g tube hub.  The current hub was black and unable to be cleaned.  The catheter was cut and a new hub was attached.  The patient was advised to call IR with any further g tube issues

## 2017-01-22 ENCOUNTER — Other Ambulatory Visit: Payer: Self-pay | Admitting: Radiation Oncology

## 2017-01-22 DIAGNOSIS — C7802 Secondary malignant neoplasm of left lung: Secondary | ICD-10-CM

## 2017-01-22 DIAGNOSIS — C01 Malignant neoplasm of base of tongue: Secondary | ICD-10-CM

## 2017-01-22 DIAGNOSIS — C3432 Malignant neoplasm of lower lobe, left bronchus or lung: Secondary | ICD-10-CM

## 2017-01-25 DIAGNOSIS — C44311 Basal cell carcinoma of skin of nose: Secondary | ICD-10-CM | POA: Diagnosis not present

## 2017-01-25 MED FILL — IMIQUIMOD 5% CREAM PACKET: 5 | 10 days supply | Qty: 4 | Fill #0

## 2017-02-17 ENCOUNTER — Ambulatory Visit (HOSPITAL_BASED_OUTPATIENT_CLINIC_OR_DEPARTMENT_OTHER): Payer: Medicare Other

## 2017-02-17 DIAGNOSIS — Z452 Encounter for adjustment and management of vascular access device: Secondary | ICD-10-CM | POA: Diagnosis not present

## 2017-02-17 DIAGNOSIS — C01 Malignant neoplasm of base of tongue: Secondary | ICD-10-CM | POA: Diagnosis not present

## 2017-02-17 MED ORDER — HEPARIN SOD (PORK) LOCK FLUSH 100 UNIT/ML IV SOLN
500.0000 [IU] | Freq: Once | INTRAVENOUS | Status: AC | PRN
  Administered 2017-02-17: 500 [IU] via INTRAVENOUS
  Filled 2017-02-17: qty 5

## 2017-02-17 MED ORDER — SODIUM CHLORIDE 0.9 % IJ SOLN
10.0000 mL | INTRAMUSCULAR | Status: DC | PRN
  Filled 2017-02-17: qty 10

## 2017-02-17 NOTE — Patient Instructions (Signed)

## 2017-03-08 ENCOUNTER — Encounter (HOSPITAL_BASED_OUTPATIENT_CLINIC_OR_DEPARTMENT_OTHER): Payer: Self-pay | Admitting: *Deleted

## 2017-03-08 DIAGNOSIS — C01 Malignant neoplasm of base of tongue: Secondary | ICD-10-CM | POA: Diagnosis not present

## 2017-03-08 DIAGNOSIS — R1314 Dysphagia, pharyngoesophageal phase: Secondary | ICD-10-CM | POA: Diagnosis not present

## 2017-03-08 NOTE — H&P (Signed)
    Expand All Collapse All   Otolaryngology Clinic Note  HPI:    Debra Farrell is a 71 y.o. female patient of NEIL Erlene Senters, MD for preoperative evaluation.  Barium swallow showed mid cervical esophageal narrowing.  She is having progressive symptoms from her dysphagia, including pill dysphagia.  We are planning to perform direct laryngoscopy, esophagoscopy, possible dilation, possible Botox injection into the cricopharyngeus.  I discussed this with her in detail including risks and complications.  Questions were answered and informed consent was obtained. PMH/Meds/All/SocHx/FamHx/ROS:   Past Medical History      Past Medical History:  Diagnosis Date  . Hypertension       Past Surgical History       Past Surgical History:  Procedure Laterality Date  . HYSTERECTOMY        No family history of bleeding disorders, wound healing problems or difficulty with anesthesia.   Social History  Social History        Social History  . Marital status: Married    Spouse name: N/A  . Number of children: N/A  . Years of education: N/A      Occupational History  . Not on file.       Social History Main Topics  . Smoking status: Never Smoker  . Smokeless tobacco: Never Used  . Alcohol use Not on file  . Drug use: Unknown  . Sexual activity: Not on file       Other Topics Concern  . Not on file   Social History Narrative  . No narrative on file       Current Outpatient Prescriptions:  .  bisacodyl (DULCOLAX) 5 mg EC tablet, Take by mouth., Disp: , Rfl:  .  doxepin (SINEQUAN) 25 MG capsule, TAKE 1 CAPSULE BY MOUTH EVERY 6 HOURS, Disp: , Rfl:  .  HYDROmorphone (DILAUDID) 4 MG tablet, Take by mouth., Disp: , Rfl:  .  nut.tx,spec.frm,l-fr,iron-fos (TWOCAL HN) 0.08-2 gram-kcal/mL Liqd, Take by mouth., Disp: , Rfl:  .  ondansetron (ZOFRAN) 8 MG tablet, Take by mouth., Disp: , Rfl:  .  protein supplement (PROMOD PROTEIN) Liqd, Take by mouth., Disp: , Rfl:    A complete ROS was performed with pertinent positives/negatives noted in the HPI. The remainder of the ROS are negative.    Physical Exam:    There were no vitals taken for this visit. She is thin and healthy-appearing.  Voice is clear and respirations unlabored through the nose.  She is not coughing or clearing her throat unusually.  Ears are clear.  Anterior nose is moist.  Oral cavity is clear.  Oropharynx is clear.  Neck with some radiation fibrosis. Lungs: Clear to auscultation Heart: Regular rate and rhythm without murmurs Abdomen: Soft, active Extremities: Normal configuration Neurologic: Symmetric, grossly intact.      Impression & Plans:   Post radiation esophageal narrowing with dysphagia.  Plan: We will proceed as above.  She can advance diet immediately after surgery.  I would like her on Prilosec twice daily before meals.  Return visit here 1 month after surgery.   Lilyan Gilford, MD  [id number]/76/7209

## 2017-03-08 NOTE — H&P (Deleted)
Otolaryngology Clinic Note  HPI:    Debra Farrell is a 72 y.o. female patient of Molly Maduro, MD for evaluation of right ear drainage and muffled hearing.  3 week recheck.  2 days ago she developed mild pain then drainage and muffled hearing on the right side.  The left side seems fine.  On our previous evaluations, she has had a small dimeric area on the otherwise intact right tympanic membrane.  No recent upper respiratory infection.  The drainage is blocking her right hearing aid. PMH/Meds/All/SocHx/FamHx/ROS:   Past Medical History      Past Medical History:  Diagnosis Date  . Central perforation of tympanic membrane   . Chronic mycotic otitis externa   . Hearing loss   . Otitis media       Past Surgical History  History reviewed. No pertinent surgical history.    No family history of bleeding disorders, wound healing problems or difficulty with anesthesia.   Social History  Social History        Social History  . Marital status: Married    Spouse name: N/A  . Number of children: N/A  . Years of education: N/A      Occupational History  . Not on file.       Social History Main Topics  . Smoking status: Never Smoker  . Smokeless tobacco: Never Used  . Alcohol use Not on file  . Drug use: Unknown  . Sexual activity: Not on file       Other Topics Concern  . Not on file      Social History Narrative  . No narrative on file       Current Outpatient Prescriptions:  .  aspirin-calcium carbonate 81 mg-300 mg calcium(777 mg) Tab tablet *ANTIPLATELET*, Take by mouth., Disp: , Rfl:  .  CALCIUM ORAL, Take by mouth., Disp: , Rfl:  .  cholecalciferol (VITAMIN D3) 1000 UNIT Tab, Take 1,000 Units by mouth., Disp: , Rfl:  .  ciprofloxacin-dexamethasone (CIPRODEX) 0.3-0.1 % otic suspension, Place 4 drops into the right ear 2 times daily for 10 days., Disp: 7.5 mL, Rfl: 2 .  losartan (COZAAR) 50 MG tablet, , Disp: , Rfl:  .  rosuvastatin  (CRESTOR) 10 MG tablet, , Disp: , Rfl:   A complete ROS was performed with pertinent positives/negatives noted in the HPI. The remainder of the ROS are negative.    Physical Exam:    There were no vitals taken for this visit. She appears healthy.  Left ear canal is clear with a normal aerated drum.  Right ear canal has some pale debris.  A culture is taken.  There is a small central perforation just below the umbo.    Right ear cleaning The risks and benefits of this procedure have been thoroughly discussed with the patient/parent.  The most commons risks outlined included but were not limited to: injury of the ear canal or tympanic membrane.  The patient/parent was further informed that there are other less common risks.  The patient/parent  was given the opportunity to ask questions and all such questions were answered to the patient/parent's satisfaction.  Patient/parent  acknowledged the risks and has agreed to proceed.  Procedure: Patient was laid supine and using the microscope, mucopus was removed from right  external auditory canal(s) using suction and various instrumentation including wax currettes and suctions.  A culture was taken.  Ciprodex drops were applied.  Tolerance:excellent Unplanned interventions:  none Unplanned events:  no  complications   Impression & Plans:   Acute suppurative otitis media, right, with rupture of a dimeric area of the drum.  Plan: I will call her with the culture results.  I suspect this is bacterial although minimal pain.  I will have her use Ciprodex drops unless the culture suggests otherwise.  Return visit here 2 weeks.  Routine water precautions, right.  She should mostly leave her hearing aid out for the time being on the side.   Lilyan Gilford, MD  04/03/6282

## 2017-03-13 ENCOUNTER — Ambulatory Visit (HOSPITAL_BASED_OUTPATIENT_CLINIC_OR_DEPARTMENT_OTHER): Payer: Medicare Other | Admitting: Anesthesiology

## 2017-03-13 ENCOUNTER — Encounter (HOSPITAL_BASED_OUTPATIENT_CLINIC_OR_DEPARTMENT_OTHER): Payer: Self-pay | Admitting: *Deleted

## 2017-03-13 ENCOUNTER — Encounter (HOSPITAL_BASED_OUTPATIENT_CLINIC_OR_DEPARTMENT_OTHER)
Admission: RE | Disposition: A | Discharge status: Home / Self Care | Payer: Self-pay | Source: Ambulatory Visit | Attending: Otolaryngology

## 2017-03-13 ENCOUNTER — Ambulatory Visit (HOSPITAL_BASED_OUTPATIENT_CLINIC_OR_DEPARTMENT_OTHER)
Admission: RE | Admit: 2017-03-13 | Discharge: 2017-03-13 | Disposition: A | Discharge status: Home / Self Care | Payer: Medicare Other | Source: Ambulatory Visit | Attending: Otolaryngology | Admitting: Otolaryngology

## 2017-03-13 DIAGNOSIS — R1310 Dysphagia, unspecified: Secondary | ICD-10-CM | POA: Insufficient documentation

## 2017-03-13 DIAGNOSIS — Z90710 Acquired absence of both cervix and uterus: Secondary | ICD-10-CM | POA: Diagnosis not present

## 2017-03-13 DIAGNOSIS — R1314 Dysphagia, pharyngoesophageal phase: Secondary | ICD-10-CM

## 2017-03-13 DIAGNOSIS — I10 Essential (primary) hypertension: Secondary | ICD-10-CM | POA: Diagnosis not present

## 2017-03-13 DIAGNOSIS — K222 Esophageal obstruction: Secondary | ICD-10-CM | POA: Insufficient documentation

## 2017-03-13 DIAGNOSIS — K2970 Gastritis, unspecified, without bleeding: Secondary | ICD-10-CM | POA: Diagnosis not present

## 2017-03-13 DIAGNOSIS — K228 Other specified diseases of esophagus: Secondary | ICD-10-CM | POA: Diagnosis not present

## 2017-03-13 HISTORY — DX: Gastro-esophageal reflux disease without esophagitis: K21.9

## 2017-03-13 HISTORY — PX: DIRECT LARYNGOSCOPY: SHX5326

## 2017-03-13 HISTORY — PX: ESOPHAGOSCOPY WITH DILITATION: SHX5618

## 2017-03-13 SURGERY — LARYNGOSCOPY, DIRECT
Anesthesia: General | Site: Mouth

## 2017-03-13 MED ORDER — PROPOFOL 10 MG/ML IV BOLUS
INTRAVENOUS | Status: DC | PRN
  Administered 2017-03-13: 200 mg via INTRAVENOUS

## 2017-03-13 MED ORDER — MIDAZOLAM HCL 2 MG/2ML IJ SOLN: 1.0000 mg | INTRAMUSCULAR | Status: DC | PRN

## 2017-03-13 MED ORDER — FENTANYL CITRATE (PF) 100 MCG/2ML IJ SOLN: 25.0000 ug | INTRAMUSCULAR | Status: DC | PRN

## 2017-03-13 MED ORDER — SUCCINYLCHOLINE CHLORIDE 20 MG/ML IJ SOLN
INTRAMUSCULAR | Status: DC | PRN
  Administered 2017-03-13: 50 mg via INTRAVENOUS

## 2017-03-13 MED ORDER — FENTANYL CITRATE (PF) 100 MCG/2ML IJ SOLN
INTRAMUSCULAR | Status: AC
  Filled 2017-03-13: qty 2

## 2017-03-13 MED ORDER — LIDOCAINE 2% (20 MG/ML) 5 ML SYRINGE
INTRAMUSCULAR | Status: AC
  Filled 2017-03-13: qty 5

## 2017-03-13 MED ORDER — PROPOFOL 500 MG/50ML IV EMUL
INTRAVENOUS | Status: AC
  Filled 2017-03-13: qty 50

## 2017-03-13 MED ORDER — MIDAZOLAM HCL 2 MG/2ML IJ SOLN
INTRAMUSCULAR | Status: AC
  Filled 2017-03-13: qty 2

## 2017-03-13 MED ORDER — SUCCINYLCHOLINE CHLORIDE 200 MG/10ML IV SOSY
PREFILLED_SYRINGE | INTRAVENOUS | Status: AC
  Filled 2017-03-13: qty 10

## 2017-03-13 MED ORDER — SCOPOLAMINE 1 MG/3DAYS TD PT72
MEDICATED_PATCH | TRANSDERMAL | Status: DC | PRN
  Administered 2017-03-13: 1 via TRANSDERMAL

## 2017-03-13 MED ORDER — MIDAZOLAM HCL 5 MG/5ML IJ SOLN
INTRAMUSCULAR | Status: DC | PRN
  Administered 2017-03-13: 2 mg via INTRAVENOUS

## 2017-03-13 MED ORDER — LACTATED RINGERS IV SOLN
INTRAVENOUS | Status: DC
  Administered 2017-03-13 (×2): via INTRAVENOUS

## 2017-03-13 MED ORDER — SCOPOLAMINE 1 MG/3DAYS TD PT72: 1.0000 | MEDICATED_PATCH | Freq: Once | TRANSDERMAL | Status: DC | PRN

## 2017-03-13 MED ORDER — SODIUM CHLORIDE 0.9 % IJ SOLN
INTRAMUSCULAR | Status: AC
  Filled 2017-03-13: qty 10

## 2017-03-13 MED ORDER — SCOPOLAMINE 1 MG/3DAYS TD PT72
MEDICATED_PATCH | TRANSDERMAL | Status: AC
  Filled 2017-03-13: qty 1

## 2017-03-13 MED ORDER — FENTANYL CITRATE (PF) 100 MCG/2ML IJ SOLN: 50.0000 ug | INTRAMUSCULAR | Status: DC | PRN

## 2017-03-13 MED ORDER — ONDANSETRON HCL 4 MG/2ML IJ SOLN
INTRAMUSCULAR | Status: DC | PRN
  Administered 2017-03-13: 4 mg via INTRAVENOUS

## 2017-03-13 MED ORDER — EPINEPHRINE 30 MG/30ML IJ SOLN
INTRAMUSCULAR | Status: AC
  Filled 2017-03-13: qty 1

## 2017-03-13 MED ORDER — DEXAMETHASONE SODIUM PHOSPHATE 10 MG/ML IJ SOLN
INTRAMUSCULAR | Status: AC
Start: 2017-03-13 — End: 2017-03-13
  Filled 2017-03-13: qty 1

## 2017-03-13 MED ORDER — ONDANSETRON HCL 4 MG/2ML IJ SOLN
INTRAMUSCULAR | Status: AC
  Filled 2017-03-13: qty 2

## 2017-03-13 MED ORDER — DEXAMETHASONE SODIUM PHOSPHATE 4 MG/ML IJ SOLN
INTRAMUSCULAR | Status: DC | PRN
  Administered 2017-03-13: 10 mg via INTRAVENOUS

## 2017-03-13 MED ORDER — FENTANYL CITRATE (PF) 100 MCG/2ML IJ SOLN
INTRAMUSCULAR | Status: DC | PRN
  Administered 2017-03-13: 100 ug via INTRAVENOUS

## 2017-03-13 MED ORDER — LIDOCAINE HCL (CARDIAC) 20 MG/ML IV SOLN
INTRAVENOUS | Status: DC | PRN
  Administered 2017-03-13: 30 mg via INTRAVENOUS

## 2017-03-13 MED ORDER — PROMETHAZINE HCL 25 MG/ML IJ SOLN: 6.2500 mg | INTRAMUSCULAR | Status: DC | PRN

## 2017-03-13 MED ORDER — COCAINE HCL 4 % EX SOLN
CUTANEOUS | Status: AC
  Filled 2017-03-13: qty 4

## 2017-03-13 SURGICAL SUPPLY — 28 items
CANISTER SUCT 1200ML W/VALVE (MISCELLANEOUS) ×2 IMPLANT
CONT SPEC 4OZ CLIKSEAL STRL BL (MISCELLANEOUS) IMPLANT
GAUZE SPONGE 4X4 12PLY STRL LF (GAUZE/BANDAGES/DRESSINGS) ×3 IMPLANT
GLOVE ECLIPSE 8.0 STRL XLNG CF (GLOVE) ×2 IMPLANT
GLOVE SURG SS PI 7.0 STRL IVOR (GLOVE) ×1 IMPLANT
GOWN STRL REUS W/ TWL LRG LVL3 (GOWN DISPOSABLE) IMPLANT
GOWN STRL REUS W/ TWL XL LVL3 (GOWN DISPOSABLE) IMPLANT
GOWN STRL REUS W/TWL LRG LVL3 (GOWN DISPOSABLE) ×2
GOWN STRL REUS W/TWL XL LVL3 (GOWN DISPOSABLE) ×2
MARKER SKIN DUAL TIP RULER LAB (MISCELLANEOUS) IMPLANT
NDL HYPO 18GX1.5 BLUNT FILL (NEEDLE) IMPLANT
NDL SAFETY ECLIPSE 18X1.5 (NEEDLE) IMPLANT
NDL SPNL 22GX7 QUINCKE BK (NEEDLE) IMPLANT
NEEDLE HYPO 18GX1.5 BLUNT FILL (NEEDLE) IMPLANT
NEEDLE HYPO 18GX1.5 SHARP (NEEDLE)
NEEDLE SPNL 22GX7 QUINCKE BK (NEEDLE) IMPLANT
NS IRRIG 1000ML POUR BTL (IV SOLUTION) ×2 IMPLANT
SHEET MEDIUM DRAPE 40X70 STRL (DRAPES) ×2 IMPLANT
SLEEVE SCD COMPRESS KNEE MED (MISCELLANEOUS) ×1 IMPLANT
SOLUTION BUTLER CLEAR DIP (MISCELLANEOUS) ×2 IMPLANT
SURGILUBE 2OZ TUBE FLIPTOP (MISCELLANEOUS) ×3 IMPLANT
SYR 5ML LL (SYRINGE) IMPLANT
SYR CONTROL 10ML LL (SYRINGE) IMPLANT
SYR TB 1ML LL NO SAFETY (SYRINGE) IMPLANT
TOWEL OR 17X24 6PK STRL BLUE (TOWEL DISPOSABLE) ×2 IMPLANT
TRAP SPECIMEN MUCOUS 40CC (MISCELLANEOUS) IMPLANT
TUBE CONNECTING 20X1/4 (TUBING) ×2 IMPLANT
YANKAUER SUCT BULB TIP NO VENT (SUCTIONS) IMPLANT

## 2017-03-13 NOTE — Discharge Instructions (Signed)
Push diet towards oral solids as tolerated Recheck my office 1 month please,  [phone] for an appointment    Post Anesthesia Home Care Instructions  Activity: Get plenty of rest for the remainder of the day. A responsible individual must stay with you for 24 hours following the procedure.  For the next 24 hours, DO NOT: -Drive a car -Paediatric nurse -Drink alcoholic beverages -Take any medication unless instructed by your physician -Make any legal decisions or sign important papers.  Meals: Start with liquid foods such as gelatin or soup. Progress to regular foods as tolerated. Avoid greasy, spicy, heavy foods. If nausea and/or vomiting occur, drink only clear liquids until the nausea and/or vomiting subsides. Call your physician if vomiting continues.  Special Instructions/Symptoms: Your throat may feel dry or sore from the anesthesia or the breathing tube placed in your throat during surgery. If this causes discomfort, gargle with warm salt water. The discomfort should disappear within 24 hours.  If you had a scopolamine patch placed behind your ear for the management of post- operative nausea and/or vomiting:  1. The medication in the patch is effective for 72 hours, after which it should be removed.  Wrap patch in a tissue and discard in the trash. Wash hands thoroughly with soap and water. 2. You may remove the patch earlier than 72 hours if you experience unpleasant side effects which may include dry mouth, dizziness or visual disturbances. 3. Avoid touching the patch. Wash your hands with soap and water after contact with the patch.    Call your surgeon if you experience:   1.  Fever over 101.0. 2.  Inability to urinate. 3.  Nausea and/or vomiting. 4.  Extreme swelling or bruising at the surgical site. 5.  Continued bleeding from the incision. 6.  Increased pain, redness or drainage from the incision. 7.  Problems related to your pain medication. 8.  Any problems  and/or concerns

## 2017-03-13 NOTE — Transfer of Care (Signed)
Immediate Anesthesia Transfer of Care Note  Patient: Debra Farrell  Procedure(s) Performed: Procedure(s): DIRECT LARYNGOSCOPY (N/A) ESOPHAGOSCOPY WITH DILITATION (N/A)  Patient Location: PACU  Anesthesia Type:General  Level of Consciousness: sedated  Airway & Oxygen Therapy: Patient Spontanous Breathing, Patient connected to face mask and aerosol face mask  Post-op Assessment: Report given to RN and Post -op Vital signs reviewed and stable  Post vital signs: Reviewed and stable  Last Vitals:  Vitals:   03/13/17 0634  BP: (!) 133/59  Pulse: 63  Resp: 18  Temp: 36.4 C    Last Pain:  Vitals:   03/13/17 0634  TempSrc: Oral      Patients Stated Pain Goal: 0 (77/11/65 [id number])  Complications: No apparent anesthesia complications

## 2017-03-13 NOTE — Addendum Note (Signed)
Addendum  created 03/13/17 1126 by Marrianne Mood, CRNA   Charge Capture section accepted

## 2017-03-13 NOTE — Anesthesia Procedure Notes (Signed)
Procedure Name: Intubation Date/Time: 03/13/2017 8:10 AM Performed by: Marrianne Mood Pre-anesthesia Checklist: Patient identified, Emergency Drugs available, Suction available, Patient being monitored and Timeout performed Patient Re-evaluated:Patient Re-evaluated prior to inductionOxygen Delivery Method: Circle system utilized Preoxygenation: Pre-oxygenation with 100% oxygen Intubation Type: IV induction Ventilation: Mask ventilation without difficulty Laryngoscope Size: Miller and 3 Grade View: Grade II Tube type: Oral Tube size: 6.5 mm Number of attempts: 1 Airway Equipment and Method: Stylet and Oral airway Placement Confirmation: ETT inserted through vocal cords under direct vision,  positive ETCO2 and breath sounds checked- equal and bilateral Secured at: 20 cm Tube secured with: Tape Dental Injury: Teeth and Oropharynx as per pre-operative assessment

## 2017-03-13 NOTE — Op Note (Signed)
03/13/2017  8:29 AM    Debra Farrell  [id number]   Pre-Op Dx:  esoophageal stenosis with dysphagia  Post-op Dx: same  Proc: direct laryngoscopy, esophagoscopy with dilation   Surg:  [name] T MD  Anes:  GOT  EBL:  None   Comp:   none  Findings:   Moderately narrow esophagus from cricopharyngeus down for approx 8 cm.  No lesions.  No specific cricopharyngeus tightness  Proc:  Direct Laryngoscopy with Biopsy,  Esophagoscopy,    Procedure: With the patient in a comfortable supine position, GOT anesthesia was induced without difficulty.  At an appropriate level, the table was turned 90 degrees away from Anesthesia.  A clean preparation and draping was performed in the standard fashion.  A surgical time out was obtained in the standard fashion.  A moist 4x4  was placed to protect the upper gums.     The anterior commissure laryngoscope was introduced taking care to protect lips, gums, and endotracheal tube.  Complete laryngoscopy was performed in the standard fashion.  The findings were as described above.  The laryngoscope was removed.  The cervical esophagoscope was lubricated and inserted into the hypopharynx.  With gentle pressure, it was passed through the cricopharyngeus and advanced to its full length without difficulty with the findings as described above.  It was removed.  The oropharynx, oral cavity, nasopharynx and hypopharynx were palpated with findings as described above.  The neck was palpated on both sides with the findings as described above.  At this point the procedure was completed. The tooth guard was removed.   Dental status was intact.  The patient was returned to Anesthesia, awakened, extubated, and transferred to PACU in satisfactory condition.   Dispo:   PACU to home  Plan:  Advance diet.  Recheck my office 1 mo.   Tyson Alias MD

## 2017-03-13 NOTE — Interval H&P Note (Signed)
History and Physical Interval Note:  03/13/2017 7:50 AM  Debra Farrell  has presented today for surgery, with the diagnosis of CERVICAL ESOPHAGEAL NARROWING  The various methods of treatment have been discussed with the patient and family. After consideration of risks, benefits and other options for treatment, the patient has consented to  Procedure(s): DIRECT LARYNGOSCOPY (N/A) ESOPHAGOSCOPY WITH DILITATION POSSIBLE BOTOX CRICOPHARYNGEUS (N/A) as a surgical intervention .  The patient's history has been re-reviewed, patient re-examined, no change in status, stable for surgery.  I have re-reviewed the patient's chart and labs.  Questions were answered to the patient's satisfaction.     [name]

## 2017-03-13 NOTE — Anesthesia Preprocedure Evaluation (Signed)
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    Pulmonary neg pulmonary ROS,    Pulmonary exam normal breath sounds clear to auscultation       Cardiovascular hypertension, Normal cardiovascular exam Rhythm:Regular Rate:Normal     Neuro/Psych negative neurological ROS  negative psych ROS   GI/Hepatic negative GI ROS, Neg liver ROS,   Endo/Other  negative endocrine ROS  Renal/GU negative Renal ROS  negative genitourinary   Musculoskeletal negative musculoskeletal ROS (+)   Abdominal   Peds negative pediatric ROS (+)  Hematology negative hematology ROS (+)   Anesthesia Other Findings   Reproductive/Obstetrics negative OB ROS                             Anesthesia Physical Anesthesia Plan  ASA: II  Anesthesia Plan: General   Post-op Pain Management:    Induction: Intravenous  PONV Risk Score and Plan: 1 and Ondansetron and Dexamethasone  Airway Management Planned: Oral ETT  Additional Equipment:   Intra-op Plan:   Post-operative Plan: Extubation in OR  Informed Consent: I have reviewed the patients History and Physical, chart, labs and discussed the procedure including the risks, benefits and alternatives for the proposed anesthesia with the patient or authorized representative who has indicated his/her understanding and acceptance.   Dental advisory given  Plan Discussed with: CRNA and Surgeon  Anesthesia Plan Comments:         Anesthesia Quick Evaluation

## 2017-03-13 NOTE — Anesthesia Postprocedure Evaluation (Signed)
Anesthesia Post Note  Patient: Donella Stade  Procedure(s) Performed: Procedure(s) (LRB): DIRECT LARYNGOSCOPY (N/A) ESOPHAGOSCOPY WITH DILITATION (N/A)     Patient location during evaluation: PACU Anesthesia Type: General Level of consciousness: awake and alert Pain management: pain level controlled Vital Signs Assessment: post-procedure vital signs reviewed and stable Respiratory status: spontaneous breathing, nonlabored ventilation, respiratory function stable and patient connected to nasal cannula oxygen Cardiovascular status: blood pressure returned to baseline and stable Postop Assessment: no signs of nausea or vomiting Anesthetic complications: no    Last Vitals:  Vitals:   03/13/17 0923 03/13/17 0930  BP:    Pulse: 69 70  Resp: 18 16  Temp:      Last Pain:  Vitals:   03/13/17 0634  TempSrc: Oral                 Edwinna Rochette S

## 2017-03-14 ENCOUNTER — Encounter (HOSPITAL_BASED_OUTPATIENT_CLINIC_OR_DEPARTMENT_OTHER): Payer: Self-pay | Admitting: Otolaryngology

## 2017-03-22 DIAGNOSIS — Z85828 Personal history of other malignant neoplasm of skin: Secondary | ICD-10-CM | POA: Diagnosis not present

## 2017-03-22 DIAGNOSIS — C44311 Basal cell carcinoma of skin of nose: Secondary | ICD-10-CM | POA: Diagnosis not present

## 2017-03-22 DIAGNOSIS — L570 Actinic keratosis: Secondary | ICD-10-CM | POA: Diagnosis not present

## 2017-03-22 MED FILL — IMIQUIMOD 5% CREAM PACKET: 5 | 46 days supply | Qty: 24 | Fill #0

## 2017-03-23 ENCOUNTER — Telehealth: Payer: Self-pay | Admitting: *Deleted

## 2017-03-23 NOTE — Telephone Encounter (Signed)
CALLED PATIENT TO INFORM OF STAT LABS AND CT FOR 04-06-17 - ARRIVAL TIME - 9:30 AM FOR LABS AND CT FOR 10:30 AM , PT. TO BE NPO - 4 HRS. PRIOR TO TEST, TEST TO BE @ [facility], LVM FOR A RETURN CALL

## 2017-03-28 MED FILL — ONDANSETRON HCL 8 MG TABLET: 8 | 30 days supply | Qty: 90 | Fill #1

## 2017-03-28 MED FILL — DOXEPIN 25 MG CAPSULE: 25 | 22 days supply | Qty: 90 | Fill #1

## 2017-04-06 ENCOUNTER — Encounter (HOSPITAL_COMMUNITY): Payer: Self-pay

## 2017-04-06 ENCOUNTER — Ambulatory Visit (HOSPITAL_COMMUNITY)
Admission: RE | Admit: 2017-04-06 | Discharge: 2017-04-06 | Disposition: A | Discharge status: Home / Self Care | Payer: Medicare Other | Source: Ambulatory Visit | Attending: Radiation Oncology | Admitting: Radiation Oncology

## 2017-04-06 ENCOUNTER — Other Ambulatory Visit (HOSPITAL_BASED_OUTPATIENT_CLINIC_OR_DEPARTMENT_OTHER): Payer: Medicare Other

## 2017-04-06 ENCOUNTER — Ambulatory Visit: Admission: RE | Admit: 2017-04-06 | Payer: Medicare Other | Source: Ambulatory Visit

## 2017-04-06 ENCOUNTER — Ambulatory Visit (HOSPITAL_BASED_OUTPATIENT_CLINIC_OR_DEPARTMENT_OTHER): Payer: Medicare Other

## 2017-04-06 ENCOUNTER — Other Ambulatory Visit: Payer: Self-pay | Admitting: Hematology and Oncology

## 2017-04-06 DIAGNOSIS — C3432 Malignant neoplasm of lower lobe, left bronchus or lung: Secondary | ICD-10-CM | POA: Insufficient documentation

## 2017-04-06 DIAGNOSIS — C760 Malignant neoplasm of head, face and neck: Secondary | ICD-10-CM | POA: Diagnosis not present

## 2017-04-06 DIAGNOSIS — C01 Malignant neoplasm of base of tongue: Secondary | ICD-10-CM

## 2017-04-06 DIAGNOSIS — I700 Atherosclerosis of aorta: Secondary | ICD-10-CM | POA: Diagnosis not present

## 2017-04-06 DIAGNOSIS — R918 Other nonspecific abnormal finding of lung field: Secondary | ICD-10-CM | POA: Insufficient documentation

## 2017-04-06 DIAGNOSIS — C7802 Secondary malignant neoplasm of left lung: Secondary | ICD-10-CM | POA: Insufficient documentation

## 2017-04-06 LAB — CBC WITH DIFFERENTIAL/PLATELET
BASO%: 0.6 % (ref 0.0–2.0)
BASOS ABS: 0 10*3/uL (ref 0.0–0.1)
EOS ABS: 0.1 10*3/uL (ref 0.0–0.5)
EOS%: 1.8 % (ref 0.0–7.0)
HCT: 28.8 % — ABNORMAL LOW (ref 34.8–46.6)
HEMOGLOBIN: 9 g/dL — AB (ref 11.6–15.9)
LYMPH%: 23.9 % (ref 14.0–49.7)
MCH: 21.3 pg — AB (ref 25.1–34.0)
MCHC: 31.3 g/dL — ABNORMAL LOW (ref 31.5–36.0)
MCV: 68.1 fL — AB (ref 79.5–101.0)
MONO#: 0.3 10*3/uL (ref 0.1–0.9)
MONO%: 9.6 % (ref 0.0–14.0)
NEUT#: 2.2 10*3/uL (ref 1.5–6.5)
NEUT%: 64.1 % (ref 38.4–76.8)
Platelets: 252 10*3/uL (ref 145–400)
RBC: 4.23 10*6/uL (ref 3.70–5.45)
RDW: 14.3 % (ref 11.2–14.5)
WBC: 3.4 10*3/uL — ABNORMAL LOW (ref 3.9–10.3)
lymph#: 0.8 10*3/uL — ABNORMAL LOW (ref 0.9–3.3)

## 2017-04-06 LAB — COMPREHENSIVE METABOLIC PANEL
ALBUMIN: 3.9 g/dL (ref 3.5–5.0)
ALK PHOS: 112 U/L (ref 40–150)
ALT: 19 U/L (ref 0–55)
AST: 25 U/L (ref 5–34)
Anion Gap: 10 mEq/L (ref 3–11)
BILIRUBIN TOTAL: 0.63 mg/dL (ref 0.20–1.20)
BUN: 44.1 mg/dL — AB (ref 7.0–26.0)
CO2: 24 mEq/L (ref 22–29)
Calcium: 10.1 mg/dL (ref 8.4–10.4)
Chloride: 106 mEq/L (ref 98–109)
Creatinine: 1.1 mg/dL (ref 0.6–1.1)
EGFR: 49 mL/min/{1.73_m2} — AB (ref >90)
GLUCOSE: 90 mg/dL (ref 70–140)
POTASSIUM: 4.3 meq/L (ref 3.5–5.1)
Sodium: 139 mEq/L (ref 136–145)
TOTAL PROTEIN: 6.9 g/dL (ref 6.4–8.3)

## 2017-04-06 MED ORDER — IOPAMIDOL (ISOVUE-300) INJECTION 61%
100.0000 mL | Freq: Once | INTRAVENOUS | Status: AC | PRN
  Administered 2017-04-06: 100 mL via INTRAVENOUS

## 2017-04-06 MED ORDER — SODIUM CHLORIDE 0.9 % IJ SOLN
10.0000 mL | INTRAMUSCULAR | Status: DC | PRN
  Administered 2017-04-06: 10 mL via INTRAVENOUS
  Filled 2017-04-06: qty 10

## 2017-04-06 MED ORDER — HYDROMORPHONE HCL 4 MG PO TABS: 4.0000 mg | ORAL_TABLET | ORAL | 0 refills | Status: DC | PRN

## 2017-04-06 MED ORDER — IOPAMIDOL (ISOVUE-300) INJECTION 61%
INTRAVENOUS | Status: AC
  Administered 2017-04-06: 100 mL via INTRAVENOUS
  Filled 2017-04-06: qty 100

## 2017-04-06 MED ORDER — IOPAMIDOL (ISOVUE-370) INJECTION 76%
INTRAVENOUS | Status: AC
  Filled 2017-04-06: qty 100

## 2017-04-06 MED ORDER — HEPARIN SOD (PORK) LOCK FLUSH 100 UNIT/ML IV SOLN
500.0000 [IU] | Freq: Once | INTRAVENOUS | Status: AC | PRN
  Administered 2017-04-06: 500 [IU] via INTRAVENOUS
  Filled 2017-04-06: qty 5

## 2017-04-07 ENCOUNTER — Telehealth: Payer: Self-pay | Admitting: Hematology and Oncology

## 2017-04-07 ENCOUNTER — Ambulatory Visit (HOSPITAL_BASED_OUTPATIENT_CLINIC_OR_DEPARTMENT_OTHER): Payer: Medicare Other | Admitting: Hematology and Oncology

## 2017-04-07 VITALS — BP 129/54 | HR 72 | Temp 98.7°F | Resp 20 | Ht 66.0 in | Wt 134.5 lb

## 2017-04-07 DIAGNOSIS — D568 Other thalassemias: Secondary | ICD-10-CM

## 2017-04-07 DIAGNOSIS — K1230 Oral mucositis (ulcerative), unspecified: Secondary | ICD-10-CM | POA: Diagnosis not present

## 2017-04-07 DIAGNOSIS — R070 Pain in throat: Secondary | ICD-10-CM | POA: Diagnosis not present

## 2017-04-07 DIAGNOSIS — R5383 Other fatigue: Secondary | ICD-10-CM

## 2017-04-07 DIAGNOSIS — C7800 Secondary malignant neoplasm of unspecified lung: Secondary | ICD-10-CM

## 2017-04-07 DIAGNOSIS — C01 Malignant neoplasm of base of tongue: Secondary | ICD-10-CM

## 2017-04-07 DIAGNOSIS — C7802 Secondary malignant neoplasm of left lung: Secondary | ICD-10-CM

## 2017-04-07 DIAGNOSIS — C3432 Malignant neoplasm of lower lobe, left bronchus or lung: Secondary | ICD-10-CM

## 2017-04-07 MED ORDER — METOCLOPRAMIDE HCL 10 MG PO TABS: 10.0000 mg | ORAL_TABLET | Freq: Four times a day (QID) | ORAL | 1 refills | Status: DC

## 2017-04-07 MED FILL — METOCLOPRAMIDE 10 MG TABLET: 10 | 23 days supply | Qty: 90 | Fill #0

## 2017-04-07 NOTE — Telephone Encounter (Signed)
Scheduled appt per 7/13 los - Gave patient AVS and calender per los.

## 2017-04-08 ENCOUNTER — Encounter: Payer: Self-pay | Admitting: Hematology and Oncology

## 2017-04-08 NOTE — Assessment & Plan Note (Signed)
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 The patient denies recent history of bleeding such as epistaxis, hematuria or hematochezia. She is asymptomatic from the anemia. We will observe for now.

## 2017-04-08 NOTE — Progress Notes (Signed)
[place] OFFICE PROGRESS NOTE  Patient Care Team: Default, Provider, MD as PCP - General [name], RN as Oncology Nurse Navigator [name], MD as Consulting Physician (Hematology and Oncology) [name], MD as Attending Physician (Radiation Oncology) [name], RD as Dietitian (Nutrition)  SUMMARY OF ONCOLOGIC HISTORY: Oncology History   Cancer of base of tongue [facility]   Staging form: Lip and Oral Cavity, AJCC 7th Edition     Clinical stage from 10/14/2015: Stage IVA (T2, N2b, M0) - Signed by [name], MD on 10/19/2015       Cancer of base of tongue ([place])   09/29/2015 Pathology Results    Accession: [id number] FNA right neck showed necrotic cells suspicious for squamous cell cancer      09/29/2015 Pathology Results    Accession: [id number] Mouth biopsy showed pyogenic granuloma      10/08/2015 Imaging    CT neck showed 1. 3 cm right tongue base mass consistent with malignancy.2. Necrotic right level II and III nodal metastases.3. Enlarged right thyroid lobe containing multiple nodules.      10/14/2015 Imaging    PET scan showed 1. Hypermetabolic right tongue base mass with hypermetabolic right level II and III adenopathy. Small left level III lymph node appears mildly hypermetabolic as well.2. A few scattered tiny pulmonary nodules are too small for PETresolution      10/16/2015 Imaging    [place] Head/Neck:  1) Multiple thyroid lesions bilaterally; dominant nodule in right lower pole measures 22 mm and meets fine-needle aspiration criteria. 2) Complex mass within the soft tissues of the right side of neck worrisome for metastatic malignancy.      10/26/2015 Pathology Results    Accession [id number]:  Thyroid FNA - Consistent with benign follicular nodule.      10/28/2015 Surgery    Multiple tooth extractions (26), right tongue base biopsy.      10/28/2015 Pathology Results    Accession [id number]:  Tongue, right base -  Infiltrative SCC, p16 positive.   Actinomyces infection.      11/05/2015 Procedure    Port-a-cath and PEG placed.      11/11/2015 - 01/05/2016 Radiation Therapy    She received radiation      11/11/2015 - 11/12/2015 Chemotherapy    She received high dose cisplatin. Treatment was discontinued permanently due to severe side-effects      11/13/2015 Adverse Reaction    She presented with intractable nausea and vomiting      11/13/2015 - 11/30/2015 Hospital Admission    She was hospitalized for intractable nausea, vomiting and developed acute renal failure, reversed with aggressive IV fluid hydration. Subsequently, she was found to have intra-abdominal infection with MRSA requiring placement of drainage tube and IV ABx      11/24/2015 Imaging    CT scan showed large abdominal abscess      11/25/2015 Procedure    She had placement of drain      12/08/2015 Procedure    Intra-abdominal drain was removed      12/08/2015 Imaging    CT abdomen showed resolution of abscess and a new left lower base lung nodules      12/31/2015 Imaging    Enlarging left lower lobe pulmonary nodule. This could represent a hematogenous metastasis although the rate of enlargement is faster than typical.      02/16/2016 Imaging    Pulmonary nodule in the left lower lobe has increased in size from 12/08/2015. This is a new  finding when compared with 10/13/2014. Findings are worrisome for progression of pulmonary metastasis      03/14/2016 -  Radiation Therapy    She has SBRT to lung lesion      10/07/2016 Imaging    CT neck showed stable exam compared to 07/07/2016, including the persistently enlarged right jugulodigastric node which was characterized on PET-CT 04/05/2016. No evidence of new or progressive disease      10/07/2016 Imaging    Continued decrease in size of the left lower lobe pulmonary nodule which now measures 5 x 5 mm and is surrounded by radiation changes. 2. Stable small right upper lobe pulmonary nodules. No new pulmonary  lesions. 3. Stable biapical pleural and parenchymal scarring changes. 4. No mediastinal or hilar mass or adenopathy. 5. Stable multinodular right thyroid goiter. 6. No findings for upper abdominal metastatic disease. Stable left adrenal gland adenoma.      12/29/2016 Imaging    Barium swallow shows a long segment of narrowing in the cervical esophagus which would not pass a 13 mm tablet. Also reflux       01/05/2017 Imaging    Ct chest: Radiation change in the left lower lobe is similar. The previously described 5 mm pulmonary nodule is difficult to differentiate from surrounding radiation change. 2. Similar right-sided pulmonary nodules. No enlarging or suspicious pulmonary nodule identified. 3. Coronary artery atherosclerosis. Aortic atherosclerosis.       INTERVAL HISTORY: Please see below for problem oriented charting. She is doing well. Denies new neck masses or difficulties with swallowing She is dependent on feeding tube for most of caloric needs.  She is able to drink water. Her pain is good and she is attempting to wean herself pain medicine She denies recent infection, cough or chest pain  REVIEW OF SYSTEMS:   Constitutional: Denies fevers, chills or abnormal weight loss Eyes: Denies blurriness of vision Ears, nose, mouth, throat, and face: Denies mucositis or sore throat Respiratory: Denies cough, dyspnea or wheezes Cardiovascular: Denies palpitation, chest discomfort or lower extremity swelling Gastrointestinal:  Denies nausea, heartburn or change in bowel habits Skin: Denies abnormal skin rashes Lymphatics: Denies new lymphadenopathy or easy bruising Neurological:Denies numbness, tingling or new weaknesses Behavioral/Psych: Mood is stable, no new changes  All other systems were reviewed with the patient and are negative.  I have reviewed the past medical history, past surgical history, social history and family history with the patient and they are unchanged from  previous note.  ALLERGIES:  is allergic to morphine and related.  MEDICATIONS:  Current Outpatient Prescriptions  Medication Sig Dispense Refill  . Amino Acids-Protein Hydrolys (FEEDING SUPPLEMENT, PRO-STAT SUGAR FREE 64,) LIQD Place 30 mLs into feeding tube daily. 900 mL 0  . bisacodyl (DULCOLAX) 5 MG EC tablet Take 10 mg by mouth daily as needed for moderate constipation.    Marland Kitchen doxepin (SINEQUAN) 25 MG capsule TAKE 1 CAPSULE BY MOUTH EVERY 6 HOURS 90 capsule 3  . HYDROmorphone (DILAUDID) 4 MG tablet Take 1 tablet (4 mg total) by mouth every 4 (four) hours as needed for severe pain. 90 tablet 0  . metoCLOPramide (REGLAN) 10 MG tablet Take 1 tablet (10 mg total) by mouth 4 (four) times daily. 90 tablet 1  . Nutritional Supplements (PROMOD) LIQD Take by mouth.    . Nutritional Supplements (TWOCAL HN) LIQD Take 237 mLs by mouth 5 (five) times daily.    Marland Kitchen omeprazole (PRILOSEC) 20 MG capsule Take 20 mg by mouth daily.    Marland Kitchen  ondansetron (ZOFRAN) 8 MG tablet Take 1 tablet (8 mg total) by mouth every 8 (eight) hours as needed for nausea or vomiting. 90 tablet 6  . Water For Irrigation, Sterile (FREE WATER) SOLN Place 100 mLs into feeding tube 4 (four) times daily.     No current facility-administered medications for this visit.     PHYSICAL EXAMINATION: ECOG PERFORMANCE STATUS: 1 - Symptomatic but completely ambulatory  Vitals:   04/07/17 0903  BP: (!) 129/54  Pulse: 72  Resp: 20  Temp: 98.7 F (37.1 C)   Filed Weights   04/07/17 0903  Weight: 134 lb 8 oz (61 kg)    GENERAL:alert, no distress and comfortable SKIN: skin color, texture, turgor are normal, no rashes or significant lesions EYES: normal, Conjunctiva are pink and non-injected, sclera clear OROPHARYNX:no exudate, no erythema and lips, buccal mucosa, and tongue normal  NECK: supple, thyroid normal size, non-tender, without nodularity LYMPH:  no palpable lymphadenopathy in the cervical, axillary or inguinal LUNGS: clear to  auscultation and percussion with normal breathing effort HEART: regular rate & rhythm and no murmurs and no lower extremity edema ABDOMEN:abdomen soft, non-tender and normal bowel sounds Musculoskeletal:no cyanosis of digits and no clubbing  NEURO: alert & oriented x 3 with fluent speech, no focal motor/sensory deficits  LABORATORY DATA:  I have reviewed the data as listed    Component Value Date/Time   NA 139 04/06/2017 0744   K 4.3 04/06/2017 0744   CL 94 (L) 01/05/2016 0445   CO2 24 04/06/2017 0744   GLUCOSE 90 04/06/2017 0744   BUN 44.1 (H) 04/06/2017 0744   CREATININE 1.1 04/06/2017 0744   CALCIUM 10.1 04/06/2017 0744   PROT 6.9 04/06/2017 0744   ALBUMIN 3.9 04/06/2017 0744   AST 25 04/06/2017 0744   ALT 19 04/06/2017 0744   ALKPHOS 112 04/06/2017 0744   BILITOT 0.63 04/06/2017 0744   GFRNONAA >60 01/05/2016 0445   GFRAA >60 01/05/2016 0445    No results found for: SPEP, UPEP  Lab Results  Component Value Date   WBC 3.4 (L) 04/06/2017   NEUTROABS 2.2 04/06/2017   HGB 9.0 (L) 04/06/2017   HCT 28.8 (L) 04/06/2017   MCV 68.1 (L) 04/06/2017   PLT 252 04/06/2017      Chemistry      Component Value Date/Time   NA 139 04/06/2017 0744   K 4.3 04/06/2017 0744   CL 94 (L) 01/05/2016 0445   CO2 24 04/06/2017 0744   BUN 44.1 (H) 04/06/2017 0744   CREATININE 1.1 04/06/2017 0744      Component Value Date/Time   CALCIUM 10.1 04/06/2017 0744   ALKPHOS 112 04/06/2017 0744   AST 25 04/06/2017 0744   ALT 19 04/06/2017 0744   BILITOT 0.63 04/06/2017 0744       RADIOGRAPHIC STUDIES: I have personally reviewed the radiological images as listed and agreed with the findings in the report. Ct Soft Tissue Neck W Contrast  Result Date: 04/06/2017 CLINICAL DATA:  Followup head and neck cancer diagnosed December 2016. EXAM: CT NECK WITH CONTRAST TECHNIQUE: Multidetector CT imaging of the neck was performed using the standard protocol following the bolus administration of  intravenous contrast. CONTRAST:  100 cc Isovue-300 COMPARISON:  01/05/2017 and multiple previous as distant as 10/07/2016. FINDINGS: Pharynx and larynx: No evidence of recurrent or developing mucosal or submucosal lesion. Chronic post treatment changes with increased density in the pre epiglottic fat, not progressive in any way since the previous study. Salivary glands:  Chronic submandibular atrophy right more than left. Parotid glands within normal limits. Thyroid: Chronic benign thyroid goiter with multiple nodules showing extension to the superior mediastinum on the right. No change since the previous exam. Lymph nodes: Previously treated right level 2/level 3 node is unchanged with maximal diameter of 10 x 15 mm. No new or enlarging nodes. Vascular: Arterial and venous structures are patent. Limited intracranial: Negative Visualized orbits: Not included Mastoids and visualized paranasal sinuses: Clear as seen. Skeleton: Ordinary cervical spondylosis. Upper chest: See results of chest CT. Other: None significant IMPRESSION: Stable examination since April. Right level 2 level 3 lymph node again measures 10 x 15 mm, previously treated and markedly reduced in size from older studies. No evidence of new or progressive disease. Electronically Signed   By: Nelson Chimes M.D.   On: 04/06/2017 11:29   Ct Chest W Contrast  Result Date: 04/06/2017 CLINICAL DATA:  Head and neck cancer. EXAM: CT CHEST WITH CONTRAST TECHNIQUE: Multidetector CT imaging of the chest was performed during intravenous contrast administration. CONTRAST:  100 cc Isovue-300 COMPARISON:  01/05/2017 FINDINGS: Cardiovascular: The heart size is normal. No pericardial effusion. Atherosclerotic calcification is noted in the wall of the thoracic aorta. Right Port-A-Cath tip is positioned in the mid SVC. Mediastinum/Nodes: No mediastinal lymphadenopathy. There is no hilar lymphadenopathy. There is no axillary lymphadenopathy. The esophagus has normal  imaging features. Lungs/Pleura: Persistent biapical pleural-parenchymal scarring in a symmetric fashion without substantial change. New volume loss right middle lobe suggests atelectasis. 2-3 mm perifissural nodule seen right upper lung image 65 series 12 is stable. Another perifissural nodule towards the right lung base (image 102) is stable at 5 mm. Similar appearance of interstitial and focal airspace opacity in the posterior left lung base, corresponding to radiation site in compatible with evolving fibrosis. There is some bronchiectasis in the lingula. 5 mm nodule question in this region on the prior study is stable at 5 mm. Upper Abdomen: Gastrostomy tube noted in the stomach. Musculoskeletal: Bone windows reveal no worrisome lytic or sclerotic osseous lesions. IMPRESSION: 1. Stable exam.  No new or progressive findings. 2. Post radiation changes posterior left lower lobe. The potential residual 5 mm nodular density on the prior study is again identified and unchanged in the interval. This is becoming incorporated and and difficult to differentiate from the background post radiation change. 3. Stable tiny right lung nodules. Aortic Atherosclerois (ICD10-170.0) Electronically Signed   By: Misty Stanley M.D.   On: 04/06/2017 12:51    ASSESSMENT & PLAN:  Cancer of base of tongue ([place]) She has completed recent treatment for metastatic cancer to the lung. Repeat CT scan of the head and neck and chest show no evidence of disease progression or new lesions In the meantime, she is asymptomatic. Continue supportive care She remain at high risk for recurrence in the first 2 years I recommend close ENT follow-up  Metastasis to lung [facility]) She is not symptomatic. CT scan show stable imaging study.  She is not symptomatic Plan to repeat imaging study in 6 months to assess response to treatment and to exclude disease progression  Thalassemia This is likely anemia of chronic disease with background of  thalassemia. The patient denies recent history of bleeding such as epistaxis, hematuria or hematochezia. She is asymptomatic from the anemia. We will observe for now.     Throat pain in adult She has cancer associated pain. She is taking doxepin for mucositis. I refill her prescription pain medicine   Orders  Placed This Encounter  Procedures  . CT CHEST W CONTRAST    Standing Status:   Future    Standing Expiration Date:   06/07/2018    Order Specific Question:   Reason for Exam (SYMPTOM  OR DIAGNOSIS REQUIRED)    Answer:   lung met, exclude progression    Order Specific Question:   Preferred imaging location?    Answer:   [facility]  . CBC with Differential/Platelet    Standing Status:   Future    Standing Expiration Date:   05/12/2018  . Comprehensive metabolic panel    Standing Status:   Future    Standing Expiration Date:   05/12/2018  . TSH    Standing Status:   Future    Standing Expiration Date:   05/12/2018   All questions were answered. The patient knows to call the clinic with any problems, questions or concerns. No barriers to learning was detected. I spent 15 minutes counseling the patient face to face. The total time spent in the appointment was 20 minutes and more than 50% was on counseling and review of test results     [name], MD 04/08/2017 9:41 AM

## 2017-04-08 NOTE — Assessment & Plan Note (Addendum)
She has cancer associated pain. She is taking doxepin for mucositis. I refill her prescription pain medicine

## 2017-04-08 NOTE — Assessment & Plan Note (Signed)
She is not symptomatic. CT scan show stable imaging study.  She is not symptomatic Plan to repeat imaging study in 6 months to assess response to treatment and to exclude disease progression

## 2017-04-08 NOTE — Assessment & Plan Note (Signed)
She has completed recent treatment for metastatic cancer to the lung. Repeat CT scan of the head and neck and chest show no evidence of disease progression or new lesions In the meantime, she is asymptomatic. Continue supportive care She remain at high risk for recurrence in the first 2 years I recommend close ENT follow-up

## 2017-04-18 MED FILL — PILOCARPINE HCL 5 MG TABLET: 5 | 13 days supply | Qty: 40 | Fill #0

## 2017-05-17 DIAGNOSIS — C44311 Basal cell carcinoma of skin of nose: Secondary | ICD-10-CM | POA: Diagnosis not present

## 2017-05-17 DIAGNOSIS — Z85828 Personal history of other malignant neoplasm of skin: Secondary | ICD-10-CM | POA: Diagnosis not present

## 2017-05-19 ENCOUNTER — Ambulatory Visit (HOSPITAL_BASED_OUTPATIENT_CLINIC_OR_DEPARTMENT_OTHER): Payer: Medicare Other

## 2017-05-19 VITALS — BP 121/52 | HR 70 | Temp 98.0°F | Resp 20

## 2017-05-19 DIAGNOSIS — Z95828 Presence of other vascular implants and grafts: Secondary | ICD-10-CM

## 2017-05-19 DIAGNOSIS — Z452 Encounter for adjustment and management of vascular access device: Secondary | ICD-10-CM | POA: Diagnosis not present

## 2017-05-19 DIAGNOSIS — C01 Malignant neoplasm of base of tongue: Secondary | ICD-10-CM

## 2017-05-19 MED ORDER — HEPARIN SOD (PORK) LOCK FLUSH 100 UNIT/ML IV SOLN
500.0000 [IU] | Freq: Once | INTRAVENOUS | Status: AC
  Administered 2017-05-19: 500 [IU] via INTRAVENOUS
  Filled 2017-05-19: qty 5

## 2017-05-19 MED ORDER — SODIUM CHLORIDE 0.9% FLUSH
10.0000 mL | INTRAVENOUS | Status: DC | PRN
  Administered 2017-05-19: 10 mL via INTRAVENOUS
  Filled 2017-05-19: qty 10

## 2017-06-14 ENCOUNTER — Telehealth: Payer: Self-pay | Admitting: *Deleted

## 2017-06-14 NOTE — Telephone Encounter (Addendum)
Oncology Nurse Navigator Documentation  Received call from Ms. Hope.   She reported:  Still PEG dependent for medications and nutrition.  Instilling by bolus 5 cans TwoCal HN daily, Prostat daily, free water before and after use.  PEG is sluggish, not flushing efficiently.    She denied there are any obvious integrity issues with tube, connector and copy intact.    She noted PEG placed in early 2017 (note indicates 11/05/15), thinks it needs to be replaced.  I explained I would place order for evaluation at [facility]. She further noted:  Underwent esophageal dilation in June with Dr. Erik Obey to improve swallowing function.  "Only lasted a day.  I can't swallow."  (Care Everywhere indicates 6/13 procedure, 7/24 follow-up).  She has LVMM for Dr. Erik Obey with request but has not received call back.  I indicated I would call on her behalf.  I later LVMM with Dr. Noreene Filbert MA Amy.  Gayleen Orem, RN, BSN, [place] Oncology Nurse [place] [facility] [place] [phone]

## 2017-06-15 ENCOUNTER — Other Ambulatory Visit: Payer: Self-pay

## 2017-06-15 ENCOUNTER — Other Ambulatory Visit: Payer: Self-pay | Admitting: *Deleted

## 2017-06-15 DIAGNOSIS — C01 Malignant neoplasm of base of tongue: Secondary | ICD-10-CM

## 2017-06-16 DIAGNOSIS — L598 Other specified disorders of the skin and subcutaneous tissue related to radiation: Secondary | ICD-10-CM | POA: Diagnosis not present

## 2017-06-16 DIAGNOSIS — R1314 Dysphagia, pharyngoesophageal phase: Secondary | ICD-10-CM | POA: Diagnosis not present

## 2017-06-16 DIAGNOSIS — Z85810 Personal history of malignant neoplasm of tongue: Secondary | ICD-10-CM | POA: Diagnosis not present

## 2017-06-16 DIAGNOSIS — Z931 Gastrostomy status: Secondary | ICD-10-CM | POA: Diagnosis not present

## 2017-06-16 DIAGNOSIS — Z923 Personal history of irradiation: Secondary | ICD-10-CM | POA: Diagnosis not present

## 2017-06-19 ENCOUNTER — Encounter (HOSPITAL_COMMUNITY): Payer: Self-pay | Admitting: Diagnostic Radiology

## 2017-06-19 ENCOUNTER — Ambulatory Visit (HOSPITAL_COMMUNITY)
Admission: RE | Admit: 2017-06-19 | Discharge: 2017-06-19 | Disposition: A | Discharge status: Home / Self Care | Payer: Medicare Other | Source: Ambulatory Visit | Attending: Hematology and Oncology | Admitting: Hematology and Oncology

## 2017-06-19 ENCOUNTER — Other Ambulatory Visit: Payer: Self-pay | Admitting: Hematology and Oncology

## 2017-06-19 DIAGNOSIS — C01 Malignant neoplasm of base of tongue: Secondary | ICD-10-CM

## 2017-06-19 DIAGNOSIS — Z85810 Personal history of malignant neoplasm of tongue: Secondary | ICD-10-CM | POA: Insufficient documentation

## 2017-06-19 DIAGNOSIS — K9423 Gastrostomy malfunction: Secondary | ICD-10-CM | POA: Diagnosis not present

## 2017-06-19 DIAGNOSIS — Y733 Surgical instruments, materials and gastroenterology and urology devices (including sutures) associated with adverse incidents: Secondary | ICD-10-CM | POA: Insufficient documentation

## 2017-06-19 HISTORY — PX: IR REPLACE G-TUBE SIMPLE WO FLUORO: IMG2323

## 2017-06-19 MED ORDER — SILVER NITRATE-POT NITRATE 75-25 % EX MISC
CUTANEOUS | Status: AC | PRN
  Administered 2017-06-19: 2 via TOPICAL
  Administered 2017-06-19: 1 via TOPICAL

## 2017-06-19 MED ORDER — LIDOCAINE VISCOUS 2 % MT SOLN
OROMUCOSAL | Status: AC | PRN
  Administered 2017-06-19: 15 mL via OROMUCOSAL

## 2017-06-19 MED ORDER — SILVER NITRATE-POT NITRATE 75-25 % EX MISC
CUTANEOUS | Status: AC
  Filled 2017-06-19: qty 1

## 2017-06-19 MED ORDER — SILVER NITRATE-POT NITRATE 75-25 % EX MISC
CUTANEOUS | Status: AC
  Filled 2017-06-19: qty 2

## 2017-06-19 MED ORDER — LIDOCAINE VISCOUS 2 % MT SOLN
OROMUCOSAL | Status: AC
  Filled 2017-06-19: qty 15

## 2017-06-19 MED ORDER — IOPAMIDOL (ISOVUE-300) INJECTION 61%
INTRAVENOUS | Status: AC
  Filled 2017-06-19: qty 50

## 2017-06-19 NOTE — Procedures (Signed)
Successful exchange of gastrostomy tube because of poor flushing.  Patient now has a 20 Fr balloon retention catheter.  Granulation tissue treated with silver nitrate sticks.  Minimal blood loss. No immediate complication.

## 2017-06-23 ENCOUNTER — Other Ambulatory Visit: Payer: Self-pay | Admitting: Otolaryngology

## 2017-06-23 DIAGNOSIS — C01 Malignant neoplasm of base of tongue: Secondary | ICD-10-CM

## 2017-06-23 DIAGNOSIS — R1314 Dysphagia, pharyngoesophageal phase: Secondary | ICD-10-CM

## 2017-06-27 ENCOUNTER — Ambulatory Visit
Admission: RE | Admit: 2017-06-27 | Discharge: 2017-06-27 | Disposition: A | Discharge status: Home / Self Care | Payer: Medicare Other | Source: Ambulatory Visit | Attending: Otolaryngology | Admitting: Otolaryngology

## 2017-06-27 DIAGNOSIS — R1314 Dysphagia, pharyngoesophageal phase: Secondary | ICD-10-CM

## 2017-06-27 DIAGNOSIS — R1310 Dysphagia, unspecified: Secondary | ICD-10-CM | POA: Diagnosis not present

## 2017-06-27 DIAGNOSIS — C01 Malignant neoplasm of base of tongue: Secondary | ICD-10-CM

## 2017-06-30 ENCOUNTER — Ambulatory Visit (HOSPITAL_BASED_OUTPATIENT_CLINIC_OR_DEPARTMENT_OTHER): Payer: Medicare Other

## 2017-06-30 DIAGNOSIS — Z452 Encounter for adjustment and management of vascular access device: Secondary | ICD-10-CM | POA: Diagnosis not present

## 2017-06-30 DIAGNOSIS — C01 Malignant neoplasm of base of tongue: Secondary | ICD-10-CM

## 2017-06-30 MED ORDER — HEPARIN SOD (PORK) LOCK FLUSH 100 UNIT/ML IV SOLN
500.0000 [IU] | Freq: Once | INTRAVENOUS | Status: AC | PRN
  Administered 2017-06-30: 500 [IU] via INTRAVENOUS
  Filled 2017-06-30: qty 5

## 2017-06-30 MED ORDER — SODIUM CHLORIDE 0.9 % IJ SOLN
10.0000 mL | INTRAMUSCULAR | Status: DC | PRN
  Administered 2017-06-30: 10 mL via INTRAVENOUS
  Filled 2017-06-30: qty 10

## 2017-07-17 DIAGNOSIS — L570 Actinic keratosis: Secondary | ICD-10-CM | POA: Diagnosis not present

## 2017-07-17 DIAGNOSIS — Z85828 Personal history of other malignant neoplasm of skin: Secondary | ICD-10-CM | POA: Diagnosis not present

## 2017-08-11 ENCOUNTER — Ambulatory Visit (HOSPITAL_BASED_OUTPATIENT_CLINIC_OR_DEPARTMENT_OTHER): Payer: Medicare Other

## 2017-08-11 VITALS — BP 121/58 | HR 72 | Temp 98.2°F | Resp 18

## 2017-08-11 DIAGNOSIS — Z452 Encounter for adjustment and management of vascular access device: Secondary | ICD-10-CM | POA: Diagnosis not present

## 2017-08-11 DIAGNOSIS — C01 Malignant neoplasm of base of tongue: Secondary | ICD-10-CM | POA: Diagnosis not present

## 2017-08-11 DIAGNOSIS — Z23 Encounter for immunization: Secondary | ICD-10-CM | POA: Diagnosis not present

## 2017-08-11 MED ORDER — HEPARIN SOD (PORK) LOCK FLUSH 100 UNIT/ML IV SOLN
500.0000 [IU] | Freq: Once | INTRAVENOUS | Status: AC | PRN
  Administered 2017-08-11: 500 [IU] via INTRAVENOUS
  Filled 2017-08-11: qty 5

## 2017-08-11 MED ORDER — INFLUENZA VAC SPLIT HIGH-DOSE 0.5 ML IM SUSY
0.5000 mL | PREFILLED_SYRINGE | INTRAMUSCULAR | Status: AC
  Administered 2017-08-11: 0.5 mL via INTRAMUSCULAR
  Filled 2017-08-11: qty 0.5

## 2017-08-11 MED ORDER — SODIUM CHLORIDE 0.9 % IJ SOLN
10.0000 mL | INTRAMUSCULAR | Status: DC | PRN
  Administered 2017-08-11: 10 mL via INTRAVENOUS
  Filled 2017-08-11: qty 10

## 2017-08-11 NOTE — Patient Instructions (Signed)

## 2017-08-28 DIAGNOSIS — Z85828 Personal history of other malignant neoplasm of skin: Secondary | ICD-10-CM | POA: Diagnosis not present

## 2017-08-28 DIAGNOSIS — L570 Actinic keratosis: Secondary | ICD-10-CM | POA: Diagnosis not present

## 2017-09-04 ENCOUNTER — Encounter: Payer: Self-pay | Admitting: Hematology and Oncology

## 2017-09-05 ENCOUNTER — Other Ambulatory Visit (HOSPITAL_COMMUNITY): Payer: Self-pay | Admitting: Radiology

## 2017-09-05 ENCOUNTER — Ambulatory Visit (HOSPITAL_COMMUNITY)
Admission: RE | Admit: 2017-09-05 | Discharge: 2017-09-05 | Disposition: A | Discharge status: Home / Self Care | Payer: Medicare Other | Source: Ambulatory Visit | Attending: Radiology | Admitting: Radiology

## 2017-09-05 DIAGNOSIS — Y733 Surgical instruments, materials and gastroenterology and urology devices (including sutures) associated with adverse incidents: Secondary | ICD-10-CM | POA: Diagnosis not present

## 2017-09-05 DIAGNOSIS — R633 Feeding difficulties, unspecified: Secondary | ICD-10-CM

## 2017-09-05 DIAGNOSIS — K9423 Gastrostomy malfunction: Secondary | ICD-10-CM | POA: Insufficient documentation

## 2017-09-05 DIAGNOSIS — Z85810 Personal history of malignant neoplasm of tongue: Secondary | ICD-10-CM | POA: Insufficient documentation

## 2017-09-05 HISTORY — PX: IR REPLACE G-TUBE SIMPLE WO FLUORO: IMG2323

## 2017-09-06 ENCOUNTER — Encounter (HOSPITAL_COMMUNITY): Payer: Self-pay | Admitting: Radiology

## 2017-09-06 NOTE — Procedures (Signed)
Exchange of existing 20 French balloon retention gastrostomy tube with broken hub for new 20 French balloon retention gastrostomy tube.  No immediate complications.  Tube okay to use.

## 2017-09-22 ENCOUNTER — Ambulatory Visit (HOSPITAL_BASED_OUTPATIENT_CLINIC_OR_DEPARTMENT_OTHER): Payer: Medicare Other

## 2017-09-22 DIAGNOSIS — Z452 Encounter for adjustment and management of vascular access device: Secondary | ICD-10-CM

## 2017-09-22 DIAGNOSIS — C01 Malignant neoplasm of base of tongue: Secondary | ICD-10-CM | POA: Diagnosis not present

## 2017-09-22 MED ORDER — HEPARIN SOD (PORK) LOCK FLUSH 100 UNIT/ML IV SOLN
500.0000 [IU] | Freq: Once | INTRAVENOUS | Status: AC | PRN
  Administered 2017-09-22: 500 [IU] via INTRAVENOUS
  Filled 2017-09-22: qty 5

## 2017-09-22 MED ORDER — SODIUM CHLORIDE 0.9 % IJ SOLN
10.0000 mL | INTRAMUSCULAR | Status: DC | PRN
  Administered 2017-09-22: 10 mL via INTRAVENOUS
  Filled 2017-09-22: qty 10

## 2017-10-09 ENCOUNTER — Other Ambulatory Visit: Payer: Self-pay

## 2017-10-09 ENCOUNTER — Ambulatory Visit: Payer: Self-pay | Admitting: Hematology and Oncology

## 2017-10-09 ENCOUNTER — Ambulatory Visit (HOSPITAL_COMMUNITY)
Admission: RE | Admit: 2017-10-09 | Discharge: 2017-10-09 | Disposition: A | Discharge status: Home / Self Care | Payer: Medicare Other | Source: Ambulatory Visit | Attending: Hematology and Oncology | Admitting: Hematology and Oncology

## 2017-10-09 ENCOUNTER — Inpatient Hospital Stay: Payer: Medicare Other | Attending: Hematology and Oncology

## 2017-10-09 ENCOUNTER — Inpatient Hospital Stay: Payer: Medicare Other

## 2017-10-09 DIAGNOSIS — R1310 Dysphagia, unspecified: Secondary | ICD-10-CM | POA: Diagnosis not present

## 2017-10-09 DIAGNOSIS — K769 Liver disease, unspecified: Secondary | ICD-10-CM | POA: Insufficient documentation

## 2017-10-09 DIAGNOSIS — J984 Other disorders of lung: Secondary | ICD-10-CM | POA: Insufficient documentation

## 2017-10-09 DIAGNOSIS — D568 Other thalassemias: Secondary | ICD-10-CM | POA: Insufficient documentation

## 2017-10-09 DIAGNOSIS — R5383 Other fatigue: Secondary | ICD-10-CM

## 2017-10-09 DIAGNOSIS — C01 Malignant neoplasm of base of tongue: Secondary | ICD-10-CM | POA: Insufficient documentation

## 2017-10-09 DIAGNOSIS — C3432 Malignant neoplasm of lower lobe, left bronchus or lung: Secondary | ICD-10-CM | POA: Diagnosis not present

## 2017-10-09 DIAGNOSIS — C7800 Secondary malignant neoplasm of unspecified lung: Secondary | ICD-10-CM | POA: Insufficient documentation

## 2017-10-09 DIAGNOSIS — R918 Other nonspecific abnormal finding of lung field: Secondary | ICD-10-CM | POA: Insufficient documentation

## 2017-10-09 LAB — COMPREHENSIVE METABOLIC PANEL
ALK PHOS: 88 U/L (ref 40–150)
ALT: 19 U/L (ref 0–55)
AST: 24 U/L (ref 5–34)
Albumin: 4.1 g/dL (ref 3.5–5.0)
Anion gap: 8 (ref 3–11)
BILIRUBIN TOTAL: 0.5 mg/dL (ref 0.2–1.2)
BUN: 38 mg/dL — AB (ref 7–26)
CALCIUM: 9.6 mg/dL (ref 8.4–10.4)
CO2: 26 mmol/L (ref 22–29)
CREATININE: 1.07 mg/dL (ref 0.60–1.10)
Chloride: 104 mmol/L (ref 98–109)
GFR calc Af Amer: 60 mL/min — ABNORMAL LOW (ref >60)
GFR, EST NON AFRICAN AMERICAN: 52 mL/min — AB (ref >60)
GLUCOSE: 104 mg/dL (ref 70–140)
POTASSIUM: 4.1 mmol/L (ref 3.3–4.7)
Sodium: 138 mmol/L (ref 136–145)
TOTAL PROTEIN: 6.7 g/dL (ref 6.4–8.3)

## 2017-10-09 LAB — CBC WITH DIFFERENTIAL/PLATELET
Basophils Absolute: 0 10*3/uL (ref 0.0–0.1)
Basophils Relative: 1 %
Eosinophils Absolute: 0 10*3/uL (ref 0.0–0.5)
Eosinophils Relative: 1 %
HEMATOCRIT: 29.4 % — AB (ref 34.8–46.6)
HEMOGLOBIN: 9.4 g/dL — AB (ref 11.6–15.9)
LYMPHS PCT: 18 %
Lymphs Abs: 0.7 10*3/uL — ABNORMAL LOW (ref 0.9–3.3)
MCH: 21 pg — ABNORMAL LOW (ref 25.1–34.0)
MCHC: 31.8 g/dL (ref 31.5–36.0)
MCV: 66 fL — AB (ref 79.5–101.0)
Monocytes Absolute: 0.4 10*3/uL (ref 0.1–0.9)
Monocytes Relative: 11 %
NEUTROS ABS: 2.9 10*3/uL (ref 1.5–6.5)
NEUTROS PCT: 69 %
Platelets: 202 10*3/uL (ref 145–400)
RBC: 4.46 MIL/uL (ref 3.70–5.45)
RDW: 14.4 % (ref 11.2–16.1)
WBC: 4.1 10*3/uL (ref 3.9–10.3)

## 2017-10-09 LAB — TSH: TSH: 2.29 u[IU]/mL (ref 0.308–3.960)

## 2017-10-09 MED ORDER — IOPAMIDOL (ISOVUE-300) INJECTION 61%
INTRAVENOUS | Status: AC
  Filled 2017-10-09: qty 75

## 2017-10-09 MED ORDER — HEPARIN SOD (PORK) LOCK FLUSH 100 UNIT/ML IV SOLN
INTRAVENOUS | Status: AC
  Administered 2017-10-09: 500 [IU] via INTRAVENOUS
  Filled 2017-10-09: qty 5

## 2017-10-09 MED ORDER — HEPARIN SOD (PORK) LOCK FLUSH 100 UNIT/ML IV SOLN
500.0000 [IU] | Freq: Once | INTRAVENOUS | Status: AC
  Administered 2017-10-09: 500 [IU] via INTRAVENOUS

## 2017-10-09 MED ORDER — SODIUM CHLORIDE 0.9 % IJ SOLN
10.0000 mL | INTRAMUSCULAR | Status: DC | PRN
  Administered 2017-10-09: 10 mL via INTRAVENOUS
  Filled 2017-10-09: qty 10

## 2017-10-09 MED ORDER — IOPAMIDOL (ISOVUE-300) INJECTION 61%
75.0000 mL | Freq: Once | INTRAVENOUS | Status: AC | PRN
  Administered 2017-10-09: 75 mL via INTRAVENOUS

## 2017-10-10 ENCOUNTER — Inpatient Hospital Stay (HOSPITAL_BASED_OUTPATIENT_CLINIC_OR_DEPARTMENT_OTHER): Payer: Medicare Other | Admitting: Hematology and Oncology

## 2017-10-10 ENCOUNTER — Telehealth: Payer: Self-pay | Admitting: Hematology and Oncology

## 2017-10-10 DIAGNOSIS — D568 Other thalassemias: Secondary | ICD-10-CM | POA: Diagnosis not present

## 2017-10-10 DIAGNOSIS — C7800 Secondary malignant neoplasm of unspecified lung: Secondary | ICD-10-CM

## 2017-10-10 DIAGNOSIS — C01 Malignant neoplasm of base of tongue: Secondary | ICD-10-CM

## 2017-10-10 DIAGNOSIS — R1314 Dysphagia, pharyngoesophageal phase: Secondary | ICD-10-CM | POA: Diagnosis not present

## 2017-10-10 DIAGNOSIS — C7802 Secondary malignant neoplasm of left lung: Secondary | ICD-10-CM

## 2017-10-10 DIAGNOSIS — R1310 Dysphagia, unspecified: Secondary | ICD-10-CM | POA: Diagnosis not present

## 2017-10-10 MED ORDER — LIDOCAINE-PRILOCAINE 2.5-2.5 % EX CREA: 1.0000 "application " | TOPICAL_CREAM | CUTANEOUS | 6 refills | Status: DC | PRN

## 2017-10-10 MED FILL — LIDOCAINE-PRILOCAINE CREAM: 2.5-2.5 | 15 days supply | Qty: 30 | Fill #0

## 2017-10-10 NOTE — Telephone Encounter (Signed)
Gave patient AVs and calendar of upcoming January through July appointments.

## 2017-10-11 ENCOUNTER — Encounter: Payer: Self-pay | Admitting: Hematology and Oncology

## 2017-10-11 NOTE — Progress Notes (Signed)
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 3 cm right tongue base mass consistent with malignancy.2. Necrotic right level II and III nodal metastases.3. Enlarged right thyroid lobe containing multiple nodules.      10/14/2015 Imaging    PET scan showed 1. Hypermetabolic right tongue base mass with hypermetabolic right level II and III adenopathy. Small left level III lymph node appears mildly hypermetabolic as well.2. A few scattered tiny pulmonary nodules are too small for PETresolution      10/16/2015 Imaging    [place] Head/Neck:  1) Multiple thyroid lesions bilaterally; dominant nodule in right lower pole measures 22 mm and meets fine-needle aspiration criteria. 2) Complex mass within the soft tissues of the right side of neck worrisome for metastatic malignancy.      10/26/2015 Pathology Results    Accession [id number]:  Thyroid FNA - Consistent with benign follicular nodule.      10/28/2015 Surgery    Multiple tooth extractions (26), right tongue base biopsy.      10/28/2015 Pathology Results    Accession [id number]:  Tongue, right base -  Infiltrative SCC, p16 positive.   Actinomyces infection.      11/05/2015 Procedure    Port-a-cath and PEG placed.      11/11/2015 - 01/05/2016 Radiation Therapy    Site/dose:   Base of tongue and bilateral neck / 70 Gy in 35 fractions to gross disease, 63 Gy in 35 fractions to high risk nodal echelons, and 56 Gy in 35 fractions to intermediate risk nodal echelons Beams/energy:   Helical IMRT / 6 MV photons      11/11/2015 - 11/12/2015 Chemotherapy    She received high dose cisplatin. Treatment was discontinued permanently due to severe side-effects      11/13/2015 Adverse Reaction    She presented with intractable nausea and vomiting      11/13/2015 - 11/30/2015 Hospital Admission    She was hospitalized for intractable nausea, vomiting and developed acute renal failure, reversed with aggressive IV fluid hydration. Subsequently, she was found to have intra-abdominal infection with MRSA requiring placement of drainage tube and IV ABx      11/24/2015 Imaging    CT scan showed large abdominal abscess      11/25/2015 Procedure    She had placement of drain      12/08/2015 Procedure    Intra-abdominal drain was removed      12/08/2015 Imaging    CT abdomen showed resolution of abscess and a new left lower base lung nodules      12/31/2015 Imaging    Enlarging left lower  lobe pulmonary nodule. This could represent a hematogenous metastasis although the rate of enlargement is faster than typical.      02/16/2016 Imaging    Pulmonary nodule in the left lower lobe has increased in size from 12/08/2015. This is a new finding when compared with 10/13/2014. Findings are worrisome for progression of pulmonary metastasis      03/14/2016 - 03/23/2016 Radiation Therapy    Site/dose:  Left lower lung / 60 Gy in 5 fractions.  Beams/energy:   SBRT-3D / 6FFF      10/07/2016 Imaging    CT neck showed stable exam compared to 07/07/2016, including the persistently enlarged right jugulodigastric node which was characterized on PET-CT  04/05/2016. No evidence of new or progressive disease      10/07/2016 Imaging    Continued decrease in size of the left lower lobe pulmonary nodule which now measures 5 x 5 mm and is surrounded by radiation changes. 2. Stable small right upper lobe pulmonary nodules. No new pulmonary lesions. 3. Stable biapical pleural and parenchymal scarring changes. 4. No mediastinal or hilar mass or adenopathy. 5. Stable multinodular right thyroid goiter. 6. No findings for upper abdominal metastatic disease. Stable left adrenal gland adenoma.      12/29/2016 Imaging    Barium swallow shows a long segment of narrowing in the cervical esophagus which would not pass a 13 mm tablet. Also reflux       01/05/2017 Imaging    Ct chest: Radiation change in the left lower lobe is similar. The previously described 5 mm pulmonary nodule is difficult to differentiate from surrounding radiation change. 2. Similar right-sided pulmonary nodules. No enlarging or suspicious pulmonary nodule identified. 3. Coronary artery atherosclerosis. Aortic atherosclerosis.      06/19/2017 Procedure    Successful exchange of the gastrostomy tube. The patient has a 34 [place] balloon retention catheter.      10/09/2017 Imaging    1. No evidence of lung cancer metastasis. 2. 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 Recommend continued surveillance. 3. Stable apical scarring. 4. Nodule liver suggests cirrhosis       INTERVAL HISTORY: Please see below for problem oriented charting. She returns for further follow-up with her daughter She is doing well She came off majority of her medications She is depending on tube feeding for nutritional supplement and is not able to swallow Last year, after esophageal dilation, she has significant improvement but since then it has gotten progressively worse again She denies recent infection, cough, chest pain or shortness of  breath No other new lymphadenopathy  REVIEW OF SYSTEMS:   Constitutional: Denies fevers, chills or abnormal weight loss Eyes: Denies blurriness of vision Ears, nose, mouth, throat, and face: Denies mucositis or sore throat Respiratory: Denies cough, dyspnea or wheezes Cardiovascular: Denies palpitation, chest discomfort or lower extremity swelling Gastrointestinal:  Denies nausea, heartburn or change in bowel habits Skin: Denies abnormal skin rashes Lymphatics: Denies new lymphadenopathy or easy bruising Neurological:Denies numbness, tingling or new weaknesses Behavioral/Psych: Mood is stable, no new changes  All other systems were reviewed with the patient and are negative.  I have reviewed the past medical history, past surgical history, social history and family history with the patient and they are unchanged from previous note.  ALLERGIES:  is allergic to morphine and related.  MEDICATIONS:  Current Outpatient Medications  Medication Sig Dispense Refill  . Amino Acids-Protein Hydrolys (FEEDING SUPPLEMENT, PRO-STAT SUGAR FREE 64,) LIQD Place 30  mLs into feeding tube daily. 900 mL 0  . lidocaine-prilocaine (EMLA) cream Apply 1 application topically as needed. 30 g 6  . Nutritional Supplements (PROMOD) LIQD Take by mouth.    . Nutritional Supplements (TWOCAL HN) LIQD Take 237 mLs by mouth 5 (five) times daily.    . Water For Irrigation, Sterile (FREE WATER) SOLN Place 100 mLs into feeding tube 4 (four) times daily.     No current facility-administered medications for this visit.     PHYSICAL EXAMINATION: ECOG PERFORMANCE STATUS: 1 - Symptomatic but completely ambulatory  Vitals:   10/10/17 0919  BP: (!) 136/57  Pulse: 67  Resp: 18  Temp: 98.2 F (36.8 C)  SpO2: 100%   Filed Weights   10/10/17 0919  Weight: 136 lb 14.4 oz (62.1 kg)    GENERAL:alert, no distress and comfortable SKIN: skin color, texture, turgor are normal, no rashes or significant lesions EYES:  normal, Conjunctiva are pink and non-injected, sclera clear OROPHARYNX:no exudate, no erythema and lips, buccal mucosa, and tongue normal  NECK: supple, thyroid normal size, non-tender, without nodularity LYMPH:  no palpable lymphadenopathy in the cervical, axillary or inguinal LUNGS: clear to auscultation and percussion with normal breathing effort HEART: regular rate & rhythm and no murmurs and no lower extremity edema ABDOMEN:abdomen soft, non-tender and normal bowel sounds Musculoskeletal:no cyanosis of digits and no clubbing  NEURO: alert & oriented x 3 with fluent speech, no focal motor/sensory deficits  LABORATORY DATA:  I have reviewed the data as listed    Component Value Date/Time   NA 138 10/09/2017 1303   NA 139 04/06/2017 0744   K 4.1 10/09/2017 1303   K 4.3 04/06/2017 0744   CL 104 10/09/2017 1303   CO2 26 10/09/2017 1303   CO2 24 04/06/2017 0744   GLUCOSE 104 10/09/2017 1303   GLUCOSE 90 04/06/2017 0744   BUN 38 (H) 10/09/2017 1303   BUN 44.1 (H) 04/06/2017 0744   CREATININE 1.07 10/09/2017 1303   CREATININE 1.1 04/06/2017 0744   CALCIUM 9.6 10/09/2017 1303   CALCIUM 10.1 04/06/2017 0744   PROT 6.7 10/09/2017 1303   PROT 6.9 04/06/2017 0744   ALBUMIN 4.1 10/09/2017 1303   ALBUMIN 3.9 04/06/2017 0744   AST 24 10/09/2017 1303   AST 25 04/06/2017 0744   ALT 19 10/09/2017 1303   ALT 19 04/06/2017 0744   ALKPHOS 88 10/09/2017 1303   ALKPHOS 112 04/06/2017 0744   BILITOT 0.5 10/09/2017 1303   BILITOT 0.63 04/06/2017 0744   GFRNONAA 52 (L) 10/09/2017 1303   GFRAA 60 (L) 10/09/2017 1303    No results found for: SPEP, UPEP  Lab Results  Component Value Date   WBC 4.1 10/09/2017   NEUTROABS 2.9 10/09/2017   HGB 9.4 (L) 10/09/2017   HCT 29.4 (L) 10/09/2017   MCV 66.0 (L) 10/09/2017   PLT 202 10/09/2017      Chemistry      Component Value Date/Time   NA 138 10/09/2017 1303   NA 139 04/06/2017 0744   K 4.1 10/09/2017 1303   K 4.3 04/06/2017 0744   CL  104 10/09/2017 1303   CO2 26 10/09/2017 1303   CO2 24 04/06/2017 0744   BUN 38 (H) 10/09/2017 1303   BUN 44.1 (H) 04/06/2017 0744   CREATININE 1.07 10/09/2017 1303   CREATININE 1.1 04/06/2017 0744      Component Value Date/Time   CALCIUM 9.6 10/09/2017 1303   CALCIUM 10.1 04/06/2017 0744   ALKPHOS 88  10/09/2017 1303   ALKPHOS 112 04/06/2017 0744   AST 24 10/09/2017 1303   AST 25 04/06/2017 0744   ALT 19 10/09/2017 1303   ALT 19 04/06/2017 0744   BILITOT 0.5 10/09/2017 1303   BILITOT 0.63 04/06/2017 0744       RADIOGRAPHIC STUDIES: I have reviewed multiple imaging study with her and her daughter I have personally reviewed the radiological images as listed and agreed with the findings in the report. Ct Chest W Contrast  Result Date: 10/09/2017 CLINICAL DATA:  Head and neck carcinoma. EXAM: CT CHEST WITH CONTRAST TECHNIQUE: Multidetector CT imaging of the chest was performed during intravenous contrast administration. CONTRAST:  36mL ISOVUE-300 IOPAMIDOL (ISOVUE-300) INJECTION 61% COMPARISON:  Neck CT 04/06/2017, PET-CT 711 17 FINDINGS: Cardiovascular: No significant vascular findings. Normal heart size. No pericardial effusion. Port in the RIGHT chest with tip in distal SVC Mediastinum/Nodes: No axillary supraclavicular adenopathy. No mediastinal adenopathy. No pericardial fluid. Esophagus normal Lungs/Pleura: Biapical consolidation with bronchiectasis which is symmetric. Findings not changed from comparison exam. Small nodule along the inferior RIGHT oblique fissure is unchanged (image 11, series 5). Pleuroparenchymal nodular thickening in the LEFT lower lobe again noted. The nodular component measures 17 mm compared to 17 mm on prior remeasured (image 131, series 5). No clear enlargement. Upper Abdomen: Percutaneous gastrostomy tube in place. Fine nodular contour of the liver. No abdominal adenopathy. Musculoskeletal: No aggressive osseous lesion IMPRESSION: 1. No evidence of lung cancer  metastasis. 2. 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 Recommend continued surveillance. 3. Stable apical scarring. 4. Nodule liver suggests cirrhosis Electronically Signed   By: Suzy Bouchard M.D.   On: 10/09/2017 15:51    ASSESSMENT & PLAN:  Cancer of base of tongue ([place]) She has completed recent treatment for metastatic cancer to the lung. Repeat CT scan of the chest showed no evidence of disease progression or new lesions In the meantime, she is asymptomatic. Continue supportive care She remain at high risk for recurrence in the first 2 years I recommend close ENT follow-up I plan to repeat CT imaging again in 6 months.  Metastasis to lung [facility]) She is not symptomatic. CT scan show stable imaging study.  She is not symptomatic Plan to repeat imaging study in 6 months to assess response to treatment and to exclude disease progression  Dysphagia The patient has severe dysphagia She underwent esophageal dilation last summer with good success but is having significant difficulties with swallowing again I recommend she contact her ENT physician to consider repeat procedure  Thalassemia This is likely anemia of chronic disease with background of thalassemia. The patient denies recent history of bleeding such as epistaxis, hematuria or hematochezia. She is asymptomatic from the anemia. We will observe for now.      Orders Placed This Encounter  Procedures  . CT CHEST W CONTRAST    Standing Status:   Future    Standing Expiration Date:   10/11/2018    Order Specific Question:   If indicated for the ordered procedure, I authorize the administration of contrast media per Radiology protocol    Answer:   Yes    Order Specific Question:   Preferred imaging location?    Answer:   [facility]    Order Specific Question:   Radiology Contrast Protocol - do NOT remove file path    Answer:    \\[email or web address].[email or web address]   All questions were answered. The patient knows to call the  clinic with any problems, questions or concerns. No barriers to learning was detected. I spent 20 minutes counseling the patient face to face. The total time spent in the appointment was 25 minutes and more than 50% was on counseling and review of test results     [name], MD 10/11/2017 1:04 PM

## 2017-10-11 NOTE — Assessment & Plan Note (Signed)
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 The patient denies recent history of bleeding such as epistaxis, hematuria or hematochezia. She is asymptomatic from the anemia. We will observe for now.

## 2017-10-11 NOTE — Assessment & Plan Note (Signed)
The patient has severe dysphagia She underwent esophageal dilation last summer with good success but is having significant difficulties with swallowing again I recommend she contact her ENT physician to consider repeat procedure

## 2017-10-11 NOTE — Assessment & Plan Note (Signed)
She has completed recent treatment for metastatic cancer to the lung. Repeat CT scan of the chest showed no evidence of disease progression or new lesions In the meantime, she is asymptomatic. Continue supportive care She remain at high risk for recurrence in the first 2 years I recommend close ENT follow-up I plan to repeat CT imaging again in 6 months.

## 2017-10-11 NOTE — Assessment & Plan Note (Signed)
She is not symptomatic. CT scan show stable imaging study.  She is not symptomatic Plan to repeat imaging study in 6 months to assess response to treatment and to exclude disease progression

## 2017-11-20 ENCOUNTER — Inpatient Hospital Stay: Payer: Medicare Other | Attending: Hematology and Oncology

## 2017-11-20 DIAGNOSIS — Z452 Encounter for adjustment and management of vascular access device: Secondary | ICD-10-CM | POA: Insufficient documentation

## 2017-11-20 DIAGNOSIS — C01 Malignant neoplasm of base of tongue: Secondary | ICD-10-CM | POA: Diagnosis not present

## 2017-11-20 DIAGNOSIS — C7800 Secondary malignant neoplasm of unspecified lung: Secondary | ICD-10-CM | POA: Diagnosis present

## 2017-11-20 MED ORDER — SODIUM CHLORIDE 0.9 % IJ SOLN
10.0000 mL | INTRAMUSCULAR | Status: DC | PRN
  Administered 2017-11-20: 10 mL via INTRAVENOUS
  Filled 2017-11-20: qty 10

## 2017-11-20 MED ORDER — HEPARIN SOD (PORK) LOCK FLUSH 100 UNIT/ML IV SOLN
500.0000 [IU] | Freq: Once | INTRAVENOUS | Status: AC | PRN
  Administered 2017-11-20: 500 [IU] via INTRAVENOUS
  Filled 2017-11-20: qty 5

## 2017-11-27 DIAGNOSIS — C44311 Basal cell carcinoma of skin of nose: Secondary | ICD-10-CM | POA: Diagnosis not present

## 2017-11-27 DIAGNOSIS — Z85828 Personal history of other malignant neoplasm of skin: Secondary | ICD-10-CM | POA: Diagnosis not present

## 2017-12-02 ENCOUNTER — Encounter (HOSPITAL_COMMUNITY): Payer: Self-pay

## 2017-12-02 ENCOUNTER — Emergency Department (HOSPITAL_COMMUNITY): Payer: Medicare Other

## 2017-12-02 ENCOUNTER — Other Ambulatory Visit: Payer: Self-pay

## 2017-12-02 ENCOUNTER — Emergency Department (HOSPITAL_COMMUNITY)
Admission: EM | Admit: 2017-12-02 | Discharge: 2017-12-02 | Disposition: A | Discharge status: Home / Self Care | Payer: Medicare Other | Attending: Emergency Medicine | Admitting: Emergency Medicine

## 2017-12-02 DIAGNOSIS — Z85810 Personal history of malignant neoplasm of tongue: Secondary | ICD-10-CM | POA: Insufficient documentation

## 2017-12-02 DIAGNOSIS — I10 Essential (primary) hypertension: Secondary | ICD-10-CM | POA: Diagnosis not present

## 2017-12-02 DIAGNOSIS — K9420 Gastrostomy complication, unspecified: Secondary | ICD-10-CM

## 2017-12-02 DIAGNOSIS — Z4682 Encounter for fitting and adjustment of non-vascular catheter: Secondary | ICD-10-CM | POA: Diagnosis not present

## 2017-12-02 DIAGNOSIS — Z8614 Personal history of Methicillin resistant Staphylococcus aureus infection: Secondary | ICD-10-CM | POA: Diagnosis not present

## 2017-12-02 DIAGNOSIS — K9423 Gastrostomy malfunction: Secondary | ICD-10-CM | POA: Diagnosis not present

## 2017-12-02 DIAGNOSIS — Z931 Gastrostomy status: Secondary | ICD-10-CM

## 2017-12-02 MED ORDER — IOPAMIDOL (ISOVUE-300) INJECTION 61%
INTRAVENOUS | Status: AC
  Administered 2017-12-02: 30 mL via GASTROSTOMY
  Filled 2017-12-02: qty 30

## 2017-12-02 NOTE — ED Provider Notes (Signed)
[facility] DEPT Provider Note   CSN: [id number] Arrival date & time: 12/02/17  0910     History   Chief Complaint Chief Complaint  Patient presents with  . G-Tube Replacement    HPI Debra Farrell is a 72 y.o. female.  63-year-old female with neck cancer who uses a G-tube for feeding is here after her feeding tube broke around 7 AM today.  She has no other complaints.  She states she has had this tube in for a while and it is been changed before.  There has been no fever no abdominal pain no vomiting.  She had a placed by interventional radiology and they have changed in the past.  The history is provided by the patient.    Past Medical History:  Diagnosis Date  . Abdominal abscess 11/30/2015  . Anemia in chronic illness 12/16/2015  . Cancer of base of tongue ([place]) 10/14/2015  . Eczema   . GERD (gastroesophageal reflux disease)   . Hx of radiation therapy 11/11/15- 01/05/2016   Base of Tongue and bilateral neck  . Hx of radiation therapy 03/14/16- 03/23/16   Left Lower Lung  . Hyperlipidemia   . Hypertension    no meds  . Infection with methicillin-resistant Staphylococcus aureus (MRSA) 11/30/2015  . Intractable nausea and vomiting 11/13/2015  . Laceration 04/2014   around R ear, for falling out of bed & hitting nightstand  . Nausea without vomiting 12/16/2015  . Thalassemia minor   . Throat pain in adult 12/29/2015    Patient Active Problem List   Diagnosis Date Noted  . Other fatigue 04/07/2017  . Metastasis to lung ([place]) 09/02/2016  . Granulation tissue of skin 09/02/2016  . Pancytopenia, acquired ([place]) 09/02/2016  . Preventive measure 09/02/2016  . Cancer associated pain 09/02/2016  . Lymphedema in adult patient 05/13/2016  . S/P gastrostomy ([place]) 03/16/2016  . Malignant neoplasm of lower lobe of left lung ([place]) 02/17/2016  . Malnutrition of moderate degree 01/04/2016  . Goals of care, counseling/discussion   . Dysphagia 12/31/2015  .  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 Encounter for palliative care   . Odynophagia   . Throat pain in adult 12/29/2015  . Nausea without vomiting 12/16/2015  . Anemia in chronic illness 12/16/2015  . Anemia due to other cause   . Dehydration   . Severe protein-calorie malnutrition ([place]) 11/14/2015  . Intractable nausea and vomiting 11/13/2015  . Gastritis, acute 11/13/2015  . Constipation due to opioid therapy 11/10/2015  . Cancer of base of tongue ([place]) 10/14/2015  . Skin lesion of back 03/21/2013  . Lipoma of axilla 03/21/2013  . Sleep disorder 03/17/2013  . Skin lesion 03/17/2013  . Health care maintenance 03/17/2013  . Right knee pain 04/05/2012  . Health maintenance examination 10/17/2011  . Thalassemia 10/17/2011  . Essential hypertension 10/17/2011  . Hyperlipidemia 10/17/2011  . Headache(784.0) 10/17/2011    Past Surgical History:  Procedure Laterality Date  . ABDOMINAL HYSTERECTOMY  1990   partial  . DIRECT LARYNGOSCOPY N/A 10/28/2015   Procedure: DIRECT LARYNGOSCOPY WITH BIOPSY;  Surgeon: [name], MD;  Location: [facility];  Service: ENT;  Laterality: N/A;  . DIRECT LARYNGOSCOPY N/A 03/13/2017   Procedure: DIRECT LARYNGOSCOPY;  Surgeon: [name], MD;  Location: [facility];  Service: ENT;  Laterality: N/A;  . ESOPHAGOSCOPY N/A 10/28/2015   Procedure: ESOPHAGOSCOPY;  Surgeon: [name], MD;  Location: [facility];  Service: ENT;  Laterality: N/A;  .  ESOPHAGOSCOPY WITH DILITATION N/A 03/13/2017   Procedure: ESOPHAGOSCOPY WITH DILITATION;  Surgeon: [name], MD;  Location: [facility];  Service: ENT;  Laterality: N/A;  . [place], [phone]  . IR REPLACE G-TUBE SIMPLE WO FLUORO  06/19/2017  . IR REPLACE G-TUBE SIMPLE WO FLUORO  09/05/2017  . LARYNGOSCOPY AND BRONCHOSCOPY N/A 10/28/2015   Procedure: BRONCHOSCOPY;  Surgeon: [name], MD;  Location: [facility];  Service: ENT;  Laterality: N/A;  . MULTIPLE  EXTRACTIONS WITH ALVEOLOPLASTY N/A 10/28/2015   Procedure: Extraction of tooth #'s 2-12, 14,15,17,18,20-29, 31 with alveoloplasy and mandibular left torus reduction;  Surgeon: Lenn Cal, DDS;  Location: [facility];  Service: Oral Surgery;  Laterality: N/A;  . RECTOCELE REPAIR    . TONSILLECTOMY     as a child  . urocele     correction surgery    OB History    No data available       Home Medications    Prior to Admission medications   Medication Sig Start Date End Date Taking? Authorizing Provider  Amino Acids-Protein Hydrolys (FEEDING SUPPLEMENT, PRO-STAT SUGAR FREE 64,) LIQD Place 30 mLs into feeding tube daily. 01/05/16   Eugenie Filler, MD  lidocaine-prilocaine (EMLA) cream Apply 1 application topically as needed. 10/10/17   [name], MD  Nutritional Supplements (PROMOD) LIQD Take by mouth.    [provider]  Nutritional Supplements (TWOCAL HN) LIQD Take 237 mLs by mouth 5 (five) times daily.    [provider]  Water For Irrigation, Sterile (FREE WATER) SOLN Place 100 mLs into feeding tube 4 (four) times daily. 01/05/16   Eugenie Filler, MD    Family History Family History  Problem Relation Age of Onset  . Cancer Mother        lung  . Asthma Father     Social History Social History   Tobacco Use  . Smoking status: Never Smoker  . Smokeless tobacco: Never Used  Substance Use Topics  . Alcohol use: No  . Drug use: No     Allergies   Morphine and related   Review of Systems Review of Systems  Constitutional: Negative for fever.  HENT: Negative for sore throat.   Respiratory: Negative for shortness of breath.   Cardiovascular: Negative for chest pain.  Gastrointestinal: Negative for abdominal pain.  Genitourinary: Negative for dysuria.  Skin: Negative for rash.     Physical Exam Updated Vital Signs BP (!) 138/56 (BP Location: Left Arm)   Pulse 66   Temp 98.7 F (37.1 C) (Oral)   Resp 19   Ht 5\' 6"  (1.676 m)   Wt 59.9  kg (132 lb)   SpO2 100%   BMI 21.31 kg/m   Physical Exam  Constitutional: She appears well-developed and well-nourished.  HENT:  Head: Normocephalic and atraumatic.  Eyes: Conjunctivae are normal.  Neck: Neck supple.  Cardiovascular: Normal rate, regular rhythm and normal heart sounds.  Pulmonary/Chest: Effort normal and breath sounds normal.  Abdominal: Soft. Bowel sounds are normal.  She has a feeding tube still in place that is been tied off of the top.  She has the other part of the appliance.  There is no surrounding erythema no induration and no leakage.  Musculoskeletal: She exhibits no tenderness or deformity.  Neurological: She is alert. GCS eye subscore is 4. GCS verbal subscore is 5. GCS motor subscore is 6.  Skin: Skin is warm and dry.  Psychiatric: She has a normal  mood and affect.     ED Treatments / Results  Labs (all labs ordered are listed, but only abnormal results are displayed) Labs Reviewed - No data to display  EKG  EKG Interpretation None       Radiology Dg Abdomen Peg Tube Location  Result Date: 12/02/2017 CLINICAL DATA:  Peg tube placement EXAM: ABDOMEN - 1 VIEW COMPARISON:  None. FINDINGS: Feeding tube projects over the left upper quadrant. Contrast material is noted within the fundus of the stomach. No visible contrast extravasation. No evidence of bowel obstruction. Visualized lung bases clear. IMPRESSION: Peg tube projects over the proximal stomach with contrast in the fundus of the stomach. Electronically Signed   By: Rolm Baptise M.D.   On: 12/02/2017 10:51    Procedures Gastrostomy tube replacement Date/Time: 12/02/2017 9:56 AM Performed by: Hayden Rasmussen, MD Authorized by: Hayden Rasmussen, MD  Consent: Verbal consent obtained. Risks and benefits: risks, benefits and alternatives were discussed Consent given by: patient Patient understanding: patient states understanding of the procedure being performed Patient consent: the patient's  understanding of the procedure matches consent given Patient identity confirmed: verbally with patient Local anesthesia used: no  Anesthesia: Local anesthesia used: no  Sedation: Patient sedated: no  Patient tolerance: Patient tolerated the procedure well with no immediate complications Comments: 77 French G-tube removed and replaced with a 20 French Foley catheter.  There was no resistance on placement.  Will confirm with x-ray.    (including critical care time)  Medications Ordered in ED Medications - No data to display   Initial Impression / Assessment and Plan / ED Course  I have reviewed the triage vital signs and the nursing notes.  Pertinent labs & imaging results that were available during my care of the patient were reviewed by me and considered in my medical decision making (see chart for details).  Clinical Course as of Dec 05 935  Sat Dec 02, 2017  1043 Tube appears in good position with intraluminal contrast.  [MB]    Clinical Course User Index [MB] Hayden Rasmussen, MD     Final Clinical Impressions(s) / ED Diagnoses   Final diagnoses:  Complication of feeding tube [facility])    ED Discharge Orders    None       Hayden Rasmussen, MD 12/04/17 [phone]

## 2017-12-02 NOTE — Discharge Instructions (Signed)
You will need to contact your doctors are interventional radiology to get a formal G-tube replaced.  We are unable to get a 20 [place] G-tube and have used a 20 [place] Foley in its place.  Please call them on Monday.  Return to the emergency department if you have any concerns.

## 2017-12-02 NOTE — ED Triage Notes (Signed)
Pt arrived via POV. Pt G-Tube broke off and tide off end to prevent gastric fluids from coming out. Pt is AOx4 and Ambulatory. Pt deneis any other complaints.

## 2017-12-11 NOTE — Therapy (Signed)
[facility] [address] [place], [place], [address] Phone: [phone]   Fax:  [phone]  Patient Details  Name: Debra Farrell MRN: [id number] Date of Birth: 1946/07/24 Referring Provider:  [name] MD Encounter Date: 12/11/2017  SPEECH THERAPY DISCHARGE SUMMARY  Visits from Start of Care: 7  Current functional level related to goals / functional outcomes: Pt's adherence to HEP was not always optimal.  Her goals at time of last appointment were as follows:  SLP Short Term Goals - 08/29/16 1643              SLP SHORT TERM GOAL #1    Title pt will demo HEP with rare min A (renewed until 05-23-16)    Status Achieved         SLP SHORT TERM GOAL #2    Title pt will provide SLP with rationale for doing HEP    Status Achieved                       SLP Long Term Goals - 08/29/16 0937              SLP LONG TERM GOAL #1    Title pt will demo HEP over two sessions with modified independence (all LTGs renewed until 10-29-16)    Time 3    Period --  visits    Status On-going         SLP LONG TERM GOAL #2    Title pt will tell SLP three s/s aspiration PNA with modified independence over 2 sessions    Baseline 07-25-16    Time 3    Period --  visits    Status Revised         SLP LONG TERM GOAL #3    Title pt will tell SLP why a food journal can A with return to full PO diet    Time 3    Period --  visits    Status On-going     Remaining deficits: Assumed that dysphagia remains.   Education / Equipment: HEP for dysphagia, late effects head/neck radiation on swallow ability  Plan: Patient agrees to discharge.  Patient goals were partially met. Patient is being discharged due to not returning since the last visit.  ?????      [place] ,MS, CCC-SLP  12/11/2017, 4:44 PM  [facility] [address] [place], [place], [address] Phone:  [phone]   Fax:  [phone]

## 2017-12-20 ENCOUNTER — Telehealth: Payer: Self-pay | Admitting: *Deleted

## 2017-12-20 ENCOUNTER — Other Ambulatory Visit: Payer: Self-pay | Admitting: Hematology and Oncology

## 2017-12-20 ENCOUNTER — Telehealth: Payer: Self-pay

## 2017-12-20 DIAGNOSIS — C01 Malignant neoplasm of base of tongue: Secondary | ICD-10-CM

## 2017-12-20 DIAGNOSIS — Z931 Gastrostomy status: Secondary | ICD-10-CM

## 2017-12-20 NOTE — Telephone Encounter (Signed)
Called and told that IR will be calling her to schedule peg tube replacement. Tiffany in IR will give her a card when comes to appt and she can call them directly with problems.

## 2017-12-20 NOTE — Telephone Encounter (Signed)
Hassan Rowan,  Please call IR. [place], please provide IR numbers for them to call directly in the future if PEG tube related complications

## 2017-12-20 NOTE — Telephone Encounter (Signed)
Oncology Nurse Navigator Documentation  Returned Debra Farrell's VMM.  She requested referral/appt with [facility] for replacement of Foley catheter placed in [facility] ED (3/9) d/t breakage of existing PEG and unavailability of another.  She stated Foley "takes too long" when instilling supplement.  Dr. Alvy Bimler and RN Hassan Rowan notified.  Gayleen Orem, RN, BSN Head & Neck Oncology Nurse [place] at [place] [phone]

## 2017-12-20 NOTE — Telephone Encounter (Signed)
Called IR at [facility] and talked with Tiffany. She will call patient and schedule appt.

## 2017-12-21 ENCOUNTER — Ambulatory Visit (HOSPITAL_COMMUNITY)
Admission: RE | Admit: 2017-12-21 | Discharge: 2017-12-21 | Disposition: A | Discharge status: Home / Self Care | Payer: Medicare Other | Source: Ambulatory Visit | Attending: Hematology and Oncology | Admitting: Hematology and Oncology

## 2017-12-21 ENCOUNTER — Other Ambulatory Visit: Payer: Self-pay | Admitting: Hematology and Oncology

## 2017-12-21 DIAGNOSIS — Z931 Gastrostomy status: Secondary | ICD-10-CM

## 2017-12-21 DIAGNOSIS — C01 Malignant neoplasm of base of tongue: Secondary | ICD-10-CM

## 2017-12-21 DIAGNOSIS — K9423 Gastrostomy malfunction: Secondary | ICD-10-CM | POA: Diagnosis not present

## 2017-12-21 HISTORY — PX: IR REPLACE G-TUBE SIMPLE WO FLUORO: IMG2323

## 2017-12-21 NOTE — Procedures (Signed)
Replacement of existing 20 french gastrostomy(foley) tube for new 20 french balloon retention gastrostomy tube. No immediate complications.

## 2017-12-25 ENCOUNTER — Encounter (HOSPITAL_COMMUNITY): Payer: Self-pay | Admitting: Radiology

## 2018-01-01 ENCOUNTER — Inpatient Hospital Stay: Payer: Medicare Other | Attending: Hematology and Oncology

## 2018-01-01 DIAGNOSIS — R1314 Dysphagia, pharyngoesophageal phase: Secondary | ICD-10-CM | POA: Diagnosis not present

## 2018-01-01 DIAGNOSIS — C7800 Secondary malignant neoplasm of unspecified lung: Secondary | ICD-10-CM | POA: Insufficient documentation

## 2018-01-01 DIAGNOSIS — C01 Malignant neoplasm of base of tongue: Secondary | ICD-10-CM | POA: Insufficient documentation

## 2018-01-01 DIAGNOSIS — Z452 Encounter for adjustment and management of vascular access device: Secondary | ICD-10-CM | POA: Diagnosis not present

## 2018-01-01 MED ORDER — HEPARIN SOD (PORK) LOCK FLUSH 100 UNIT/ML IV SOLN
500.0000 [IU] | Freq: Once | INTRAVENOUS | Status: AC | PRN
  Administered 2018-01-01: 500 [IU] via INTRAVENOUS
  Filled 2018-01-01: qty 5

## 2018-01-01 MED ORDER — SODIUM CHLORIDE 0.9 % IJ SOLN
10.0000 mL | INTRAMUSCULAR | Status: DC | PRN
  Administered 2018-01-01: 10 mL via INTRAVENOUS
  Filled 2018-01-01: qty 10

## 2018-01-05 NOTE — H&P (Signed)
Otolaryngology Clinic Note  HPI:    Debra Farrell is a 72 y.o. female patient of NEIL Erlene Senters, MD for evaluation of persistent dysphagia.  34-month return visit.  18-month status post radiation therapy.  We did a barium swallow 12 months ago which showed esophageal narrowing.  She is taking nutrition per G-tube.  This is something of a challenge because she develops nausea and emesis with too much feeding.  They have tried a more concentrated caloric formula without much improvement.  She can only really tolerate to 3 cans/day.  She has recently gotten new dentures which may help somewhat.  She is overall losing some weight.  No actual pain.  No neck masses. PMH/Meds/All/SocHx/FamHx/ROS:   Past Medical History      Past Medical History:  Diagnosis Date  . Hypertension       Past Surgical History       Past Surgical History:  Procedure Laterality Date  . HYSTERECTOMY        No family history of bleeding disorders, wound healing problems or difficulty with anesthesia.   Social History  Social History        Social History  . Marital status: Married    Spouse name: N/A  . Number of children: N/A  . Years of education: N/A      Occupational History  . Not on file.       Social History Main Topics  . Smoking status: Never Smoker  . Smokeless tobacco: Never Used  . Alcohol use Not on file  . Drug use: Unknown  . Sexual activity: Not on file       Other Topics Concern  . Not on file      Social History Narrative  . No narrative on file       Current Outpatient Prescriptions:  .  amino acids-protein hydrolysate (PRO-STAT SUGAR FREE) 15 gram- 100 kcal/30 mL packet, 30 mLs., Disp: , Rfl:  .  bisacodyl (DULCOLAX) 5 mg EC tablet, Take by mouth., Disp: , Rfl:  .  doxepin (SINEQUAN) 25 MG capsule, TAKE 1 CAPSULE BY MOUTH EVERY 6 HOURS, Disp: , Rfl:  .  HYDROmorphone (DILAUDID) 4 MG tablet, Take by mouth., Disp: , Rfl:  .  lidocaine-prilocaine  (EMLA) 2.5-2.5 % cream, Apply 1 application topically as needed., Disp: , Rfl:  .  nut.tx,spec.frm,l-fr,iron-fos (TWOCAL HN) 0.08-2 gram-kcal/mL Liqd, Take by mouth., Disp: , Rfl:  .  nut.tx,spec.frm,l-fr,iron-fos (TWOCAL HN) 0.08-2 gram-kcal/mL Liqd, Take 237 mLs by mouth., Disp: , Rfl:  .  omeprazole (PRILOSEC) 20 MG capsule, Take by mouth., Disp: , Rfl:  .  ondansetron (ZOFRAN) 8 MG tablet, Take by mouth., Disp: , Rfl:  .  pilocarpine (SALAGEN, PILOCARPINE,) 5 MG tablet, Take 1 tablet (5 mg total) by mouth 3 times daily., Disp: 40 tablet, Rfl: 5 .  protein supplement (PROMOD PROTEIN) Liqd, Take by mouth., Disp: , Rfl:  .  protein supplement (PROMOD PROTEIN) Liqd, Take by mouth., Disp: , Rfl:   A complete ROS was performed with pertinent positives/negatives noted in the HPI. The remainder of the ROS are negative.    Physical Exam:    There were no vitals taken for this visit. She is cheerful and energetic.  Mental status is sharp.  Ears are clear.  Anterior nose is clear.  Oral cavity shows some atrophy and drying.  Neck without adenopathy. Lungs: Clear to auscultation Heart: Regular rate and rhythm without murmurs Abdomen: Soft, active Extremities: Normal configuration Neurologic: Symmetric, grossly  intact.       Impression & Plans:   Dysphagia and esophageal stenosis status post radiation.  Plan: I think we should proceed directly to esophagoscopy, dilation, and possible Botox injection.  We will plan this in the near future.  I discussed the surgery with her in detail including risks and complications.  Questions were answered and informed consent was obtained.  I will see her back here 2 weeks postop.   Lilyan Gilford, MD  4/0/3524

## 2018-01-11 NOTE — Pre-Procedure Instructions (Addendum)
Debra Farrell  01/11/2018      [place], [place] - [address] [facility]. [address] [place] [address] Phone: [phone] Fax: [phone]    Your procedure is scheduled on 01-18-2018  Thursday   Report to [facility] Admitting at 5:30 A.M.   Call this number if you have problems the morning of surgery:  [phone]   Remember:  Do not  eat food or drink liquids after midnight.   Take these medicines the morning of surgery with A SIP OF WATER none  STOP TAKING ANY ASPIRIN(UNLESS OTHERWISE INSTRUCTED BY YOUR SURGEON),ANTIINFLAMATORIES (IBUPROFEN,ALEVE,MOTRIN,ADVIL,GOODY'S POWDERS),HERBAL [place] 5-7 DAYS PRIOR TO SURGERY   Follow your doctors instructions regarding your Aspirin. If no instructions were given by your doctor ,then you will need to call the prescribing office to get instructions.   Special Instructions: [facility] - Preparing for Surgery  Before surgery, you can play an important role.  Because skin is not sterile, your skin needs to be as free of germs as possible.  You can reduce the number of germs on you skin by washing with CHG (chlorahexidine gluconate) soap before surgery.  CHG is an antiseptic cleaner which kills germs and bonds with the skin to continue killing germs even after washing.  Please DO NOT use if you have an allergy to CHG or antibacterial soaps.  If your skin becomes reddened/irritated stop using the CHG and inform your nurse when you arrive at Short Stay.  Do not shave (including legs and underarms) for at least 48 hours prior to the first CHG shower.  You may shave your face.  Please follow these instructions carefully:   1.  Shower with CHG Soap the night before surgery and the   morning of Surgery.  2.  If you choose to wash your hair, wash your hair first as usual with your normal shampoo.  3.  After you shampoo, rinse your hair and body  thoroughly to remove the  Shampoo.  4.  Use CHG as you would any other liquid soap.  You can apply chg directly  to the skin and wash gently with scrungie or a clean washcloth.  5.  Apply the CHG Soap to your body ONLY FROM THE NECK DOWN.   Do not use on open wounds or open sores.  Avoid contact with your eyes,  ears, mouth and genitals (private parts).  Wash genitals (private parts) with your normal soap.  6.  Wash thoroughly, paying special attention to the area where your surgery will be performed.  7.  Thoroughly rinse your body with warm water from the neck down.  8.  DO NOT shower/wash with your normal soap after using and rinsing o  the CHG Soap.  9.  Pat yourself dry with a clean towel.            10.  Wear clean pajamas.            11.  Place clean sheets on your bed the night of your first shower and do not sleep with pets.  Day of Surgery  Do not apply any lotions/deodorants the morning of surgery.  Please wear clean clothes to the hospital/surgery center.    Do not wear jewelry, make-up or nail polish.  Do not wear lotions, powders, or perfumes, or deodorant.  Do not shave 48 hours prior to surgery.     Do not bring valuables to the  hospital.  [facility] is not responsible for any belongings or valuables.  Contacts, dentures or bridgework may not be worn into surgery.  Leave your suitcase in the car.  After surgery it may be brought to your room.  For patients admitted to the hospital, discharge time will be determined by your treatment team.  Patients discharged the day of surgery will not be allowed to drive home.      Please read over the following fact sheets that you were given. Surgical Site Infection Prevention

## 2018-01-11 NOTE — H&P (Signed)
Otolaryngology Clinic Note  HPI:    Debra Farrell is a 72 y.o. female patient of NEIL Erlene Senters, MD for evaluation of persistent dysphagia.  73-month return visit.  19-month status post radiation therapy.  We did a barium swallow 12 months ago which showed esophageal narrowing.  She is taking nutrition per G-tube.  This is something of a challenge because she develops nausea and emesis with too much feeding.  They have tried a more concentrated caloric formula without much improvement.  She can only really tolerate to 3 cans/day.  She has recently gotten new dentures which may help somewhat.  She is overall losing some weight.  No actual pain.  No neck masses. PMH/Meds/All/SocHx/FamHx/ROS:   Past Medical History      Past Medical History:  Diagnosis Date  . Hypertension       Past Surgical History       Past Surgical History:  Procedure Laterality Date  . HYSTERECTOMY        No family history of bleeding disorders, wound healing problems or difficulty with anesthesia.   Social History  Social History        Social History  . Marital status: Married    Spouse name: N/A  . Number of children: N/A  . Years of education: N/A      Occupational History  . Not on file.       Social History Main Topics  . Smoking status: Never Smoker  . Smokeless tobacco: Never Used  . Alcohol use Not on file  . Drug use: Unknown  . Sexual activity: Not on file       Other Topics Concern  . Not on file      Social History Narrative  . No narrative on file       Current Outpatient Prescriptions:  .  amino acids-protein hydrolysate (PRO-STAT SUGAR FREE) 15 gram- 100 kcal/30 mL packet, 30 mLs., Disp: , Rfl:  .  bisacodyl (DULCOLAX) 5 mg EC tablet, Take by mouth., Disp: , Rfl:  .  doxepin (SINEQUAN) 25 MG capsule, TAKE 1 CAPSULE BY MOUTH EVERY 6 HOURS, Disp: , Rfl:  .  HYDROmorphone (DILAUDID) 4 MG tablet, Take by mouth., Disp: , Rfl:  .  lidocaine-prilocaine  (EMLA) 2.5-2.5 % cream, Apply 1 application topically as needed., Disp: , Rfl:  .  nut.tx,spec.frm,l-fr,iron-fos (TWOCAL HN) 0.08-2 gram-kcal/mL Liqd, Take by mouth., Disp: , Rfl:  .  nut.tx,spec.frm,l-fr,iron-fos (TWOCAL HN) 0.08-2 gram-kcal/mL Liqd, Take 237 mLs by mouth., Disp: , Rfl:  .  omeprazole (PRILOSEC) 20 MG capsule, Take by mouth., Disp: , Rfl:  .  ondansetron (ZOFRAN) 8 MG tablet, Take by mouth., Disp: , Rfl:  .  pilocarpine (SALAGEN, PILOCARPINE,) 5 MG tablet, Take 1 tablet (5 mg total) by mouth 3 times daily., Disp: 40 tablet, Rfl: 5 .  protein supplement (PROMOD PROTEIN) Liqd, Take by mouth., Disp: , Rfl:  .  protein supplement (PROMOD PROTEIN) Liqd, Take by mouth., Disp: , Rfl:   A complete ROS was performed with pertinent positives/negatives noted in the HPI. The remainder of the ROS are negative.    Physical Exam:    There were no vitals taken for this visit. She is cheerful and energetic.  Mental status is sharp.  Ears are clear.  Anterior nose is clear.  Oral cavity shows some atrophy and drying.  Neck without adenopathy. Lungs: Clear to auscultation Heart: Regular rate and rhythm without murmurs Abdomen: Soft, active Extremities: Normal configuration Neurologic: Symmetric, grossly  intact.       Impression & Plans:   Dysphagia and esophageal stenosis status post radiation.  Plan: I think we should proceed directly to esophagoscopy, dilation, and possible Botox injection.  We will plan this in the near future.  I discussed the surgery with her in detail including risks and complications.  Questions were answered and informed consent was obtained.  I will see her back here 2 weeks postop.   Lilyan Gilford, MD  10/31/1896

## 2018-01-11 NOTE — Progress Notes (Signed)
IB sent to Dr. Erik Obey  For pre-op orders.

## 2018-01-12 ENCOUNTER — Encounter (HOSPITAL_COMMUNITY): Payer: Self-pay

## 2018-01-12 ENCOUNTER — Encounter (HOSPITAL_COMMUNITY)
Admission: RE | Admit: 2018-01-12 | Discharge: 2018-01-12 | Disposition: A | Discharge status: Home / Self Care | Payer: Medicare Other | Source: Ambulatory Visit | Attending: Otolaryngology | Admitting: Otolaryngology

## 2018-01-12 ENCOUNTER — Inpatient Hospital Stay (HOSPITAL_COMMUNITY): Admission: RE | Admit: 2018-01-12 | Payer: Self-pay | Source: Ambulatory Visit

## 2018-01-12 DIAGNOSIS — I10 Essential (primary) hypertension: Secondary | ICD-10-CM | POA: Diagnosis not present

## 2018-01-12 DIAGNOSIS — K219 Gastro-esophageal reflux disease without esophagitis: Secondary | ICD-10-CM | POA: Insufficient documentation

## 2018-01-12 DIAGNOSIS — Z01812 Encounter for preprocedural laboratory examination: Secondary | ICD-10-CM | POA: Diagnosis not present

## 2018-01-12 DIAGNOSIS — Z85810 Personal history of malignant neoplasm of tongue: Secondary | ICD-10-CM | POA: Diagnosis not present

## 2018-01-12 DIAGNOSIS — E785 Hyperlipidemia, unspecified: Secondary | ICD-10-CM | POA: Diagnosis not present

## 2018-01-12 DIAGNOSIS — K222 Esophageal obstruction: Secondary | ICD-10-CM | POA: Insufficient documentation

## 2018-01-12 LAB — CBC
HEMATOCRIT: 30.6 % — AB (ref 36.0–46.0)
Hemoglobin: 9.5 g/dL — ABNORMAL LOW (ref 12.0–15.0)
MCH: 21 pg — ABNORMAL LOW (ref 26.0–34.0)
MCHC: 31 g/dL (ref 30.0–36.0)
MCV: 67.5 fL — ABNORMAL LOW (ref 78.0–100.0)
Platelets: 207 10*3/uL (ref 150–400)
RBC: 4.53 MIL/uL (ref 3.87–5.11)
RDW: 14.4 % (ref 11.5–15.5)
WBC: 3.7 10*3/uL — ABNORMAL LOW (ref 4.0–10.5)

## 2018-01-12 LAB — BASIC METABOLIC PANEL
Anion gap: 7 (ref 5–15)
BUN: 32 mg/dL — ABNORMAL HIGH (ref 6–20)
CHLORIDE: 106 mmol/L (ref 101–111)
CO2: 24 mmol/L (ref 22–32)
CREATININE: 0.94 mg/dL (ref 0.44–1.00)
Calcium: 9.6 mg/dL (ref 8.9–10.3)
GFR calc non Af Amer: >60 mL/min (ref >60)
GLUCOSE: 100 mg/dL — AB (ref 65–99)
Potassium: 4.5 mmol/L (ref 3.5–5.1)
Sodium: 137 mmol/L (ref 135–145)

## 2018-01-12 NOTE — Progress Notes (Signed)
No history of cardiac problems  Never seen cardiologist  Denies any cardiac testing

## 2018-01-15 NOTE — Progress Notes (Signed)
Anesthesia Chart Review:   Case:  [id number] Date/Time:  01/18/18 0715   Procedures:      DIRECT LARYNGOSCOPY (N/A )     ESOPHAGOSCOPY WITH DILITATION POSSIBLE BOTOX (N/A )   Anesthesia type:  General   Pre-op diagnosis:  ESOPHAGEAL STENOSIS   Location:  [facility] OR ROOM 09 / [facility]   Surgeon:  [name], MD     DISCUSSION: Patient is a 72 year old female scheduled for the above procedure. 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 She was recently re-evaluated by ENT for persistent dysphagia post-radiation. Previous barium swallow study showed esophageal narrowing. She receives tube feeds but has issues with N/V with too much feeding. Dr. Erik Obey recommended esophagoscopy, dilation, and possible Botox injection.   She is anemic with H/H 9.5/30.6, but this is stable and even slightly improved when compared with labs since 03/2017. I will defer decision for preoperative T&S to surgeon and/or anesthesiologist. They will evaluate patient on the day of her surgery.  VS: BP (!) 114/55   Pulse 71   Temp 37.1 C (Oral)   Resp 20   Ht 5\' 6"  (1.676 m)   Wt 129 lb 6 oz (58.7 kg)   SpO2 100%   BMI 20.88 kg/m   PROVIDERS: No PCP is listed Default, Provider, MD Patient Care Team: [name], MD as Consulting Physician (Hematology and Oncology) [name], MD as Attending Physician (Radiation Oncology)  LABS: 01/12/2018 labs reviewed. Cr 0.94. WBC 3.7. H/H 9.5/30.6, stable when compared to labs since 03/2017. PLT 207.  (all labs ordered are listed, but only abnormal results are displayed)  Labs Reviewed  BASIC METABOLIC PANEL - Abnormal; Notable for the following components:      Result Value   Glucose, Bld 100 (*)    BUN 32 (*)    All other components within normal limits  CBC - Abnormal; Notable for the following components:   WBC 3.7 (*)    Hemoglobin 9.5 (*)    HCT 30.6  (*)    MCV 67.5 (*)    MCH 21.0 (*)    All other components within normal limits   IMAGES: CT Chest 10/09/17: IMPRESSION: 1. No evidence of lung cancer metastasis. 2. 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 Recommend continued surveillance. 3. Stable apical scarring. 4. Nodule liver suggests cirrhosis  EKG: N/A  CV: N/A  Past Medical History:  Diagnosis Date  . Abdominal abscess 11/30/2015  . Anemia in chronic illness 12/16/2015  . Cancer of base of tongue ([place]) 10/14/2015  . Eczema   . GERD (gastroesophageal reflux disease)   . Hx of radiation therapy 11/11/15- 01/05/2016   Base of Tongue and bilateral neck  . Hx of radiation therapy 03/14/16- 03/23/16   Left Lower Lung  . Hyperlipidemia   . Hypertension    no meds  . Infection with methicillin-resistant Staphylococcus aureus (MRSA) 11/30/2015  . Intractable nausea and vomiting 11/13/2015  . Laceration 04/2014   around R ear, for falling out of bed & hitting nightstand  . Nausea without vomiting 12/16/2015  . Thalassemia minor   . Throat pain in adult 12/29/2015    Past Surgical History:  Procedure Laterality Date  . ABDOMINAL HYSTERECTOMY  1990   partial  . DIRECT LARYNGOSCOPY N/A 10/28/2015   Procedure: DIRECT LARYNGOSCOPY WITH BIOPSY;  Surgeon: [name], MD;  Location: [facility];  Service: ENT;  Laterality: N/A;  . DIRECT LARYNGOSCOPY N/A 03/13/2017   Procedure: DIRECT LARYNGOSCOPY;  Surgeon: [name], MD;  Location: [facility];  Service: ENT;  Laterality: N/A;  . ESOPHAGOSCOPY N/A 10/28/2015   Procedure: ESOPHAGOSCOPY;  Surgeon: [name], MD;  Location: [facility];  Service: ENT;  Laterality: N/A;  . ESOPHAGOSCOPY WITH DILITATION N/A 03/13/2017   Procedure: ESOPHAGOSCOPY WITH DILITATION;  Surgeon: [name], MD;  Location: [facility];  Service: ENT;  Laterality: N/A;  . [place], [id number]  . IR  REPLACE G-TUBE SIMPLE WO FLUORO  06/19/2017  . IR REPLACE G-TUBE SIMPLE WO FLUORO  09/05/2017  . IR REPLACE G-TUBE SIMPLE WO FLUORO  12/21/2017  . LARYNGOSCOPY AND BRONCHOSCOPY N/A 10/28/2015   Procedure: BRONCHOSCOPY;  Surgeon: [name], MD;  Location: [facility];  Service: ENT;  Laterality: N/A;  . MULTIPLE EXTRACTIONS WITH ALVEOLOPLASTY N/A 10/28/2015   Procedure: Extraction of tooth #'s 2-12, 14,15,17,18,20-29, 31 with alveoloplasy and mandibular left torus reduction;  Surgeon: Lenn Cal, DDS;  Location: [facility];  Service: Oral Surgery;  Laterality: N/A;  . port-a-cath insertion    . RECTOCELE REPAIR    . TONSILLECTOMY     as a child  . urocele     correction surgery    MEDICATIONS: . lidocaine-prilocaine (EMLA) cream  . Nutritional Supplements (PROMOD) LIQD  . Nutritional Supplements (TWOCAL HN) LIQD  . Water For Irrigation, Sterile (FREE WATER) SOLN   No current facility-administered medications for this encounter.    [facility] Short Stay Center/Anesthesiology Phone [phone] 01/15/2018 1:55 PM

## 2018-01-17 IMAGING — DX DG ABDOMEN 2V
3 series · 3 of 3 positions shown · non-contrast
Comparison: Prior PET-CT 11/05/2015

CLINICAL DATA: 70-year-old female with 2- 3 day history of
nausea/vomiting after chemotherapy for tongue cancer

EXAM:
ABDOMEN - 2 VIEW

[abdomen supine (1 of 2)]
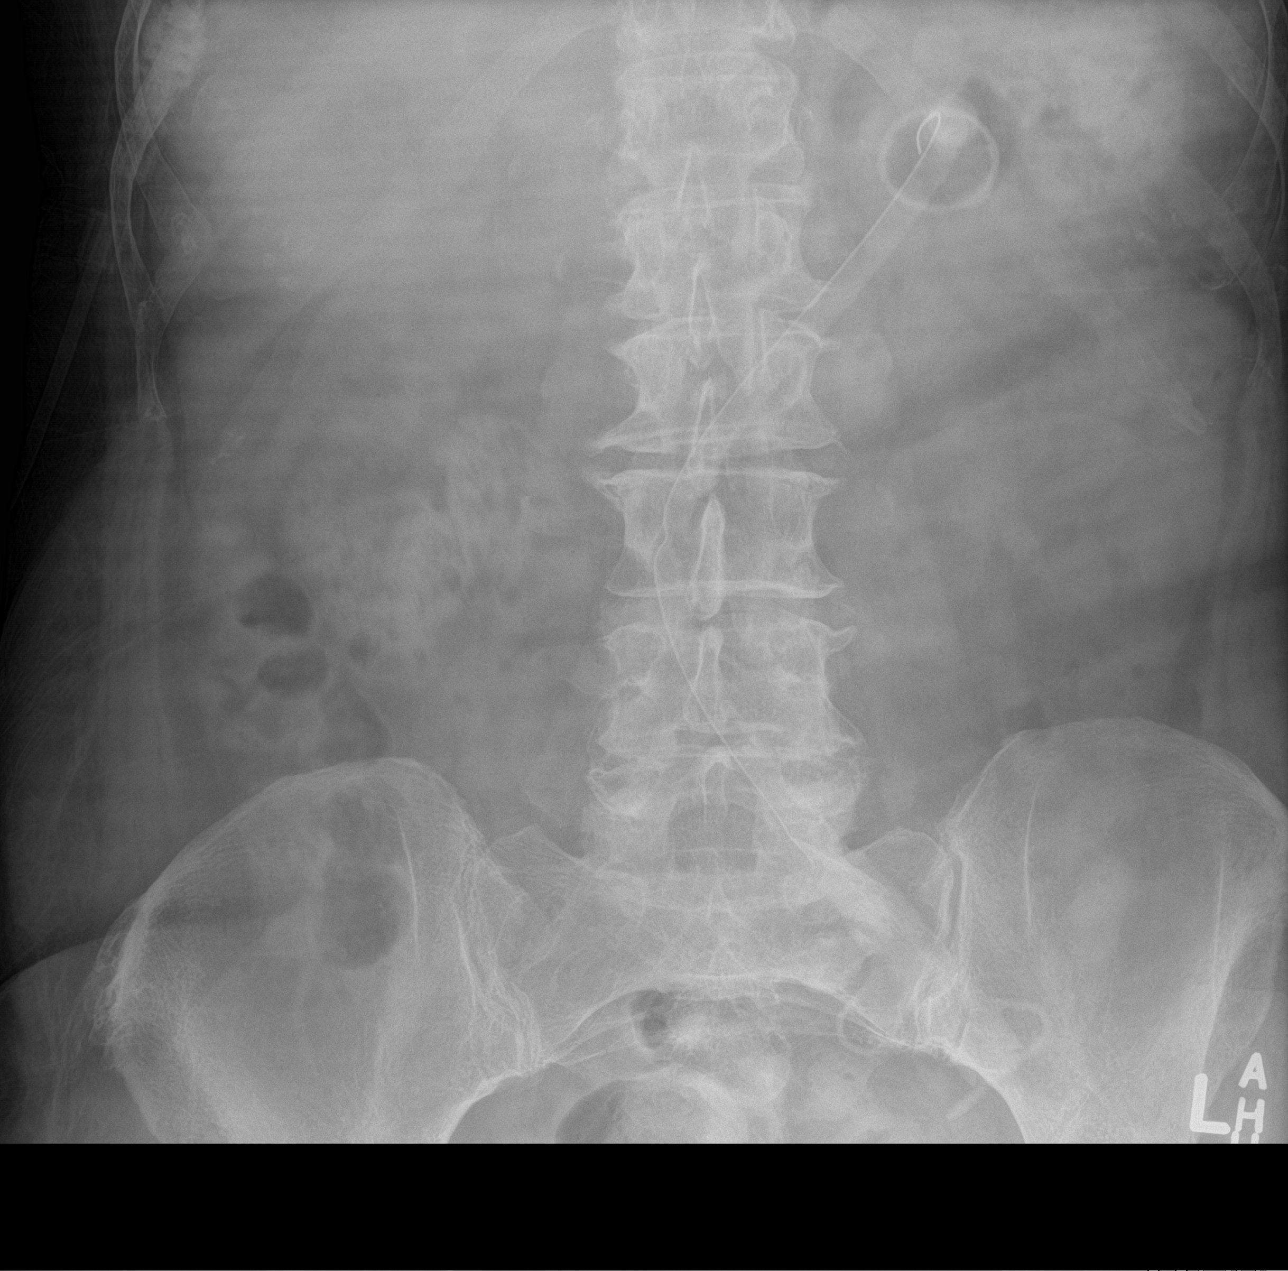

[abdomen supine (2 of 2)]
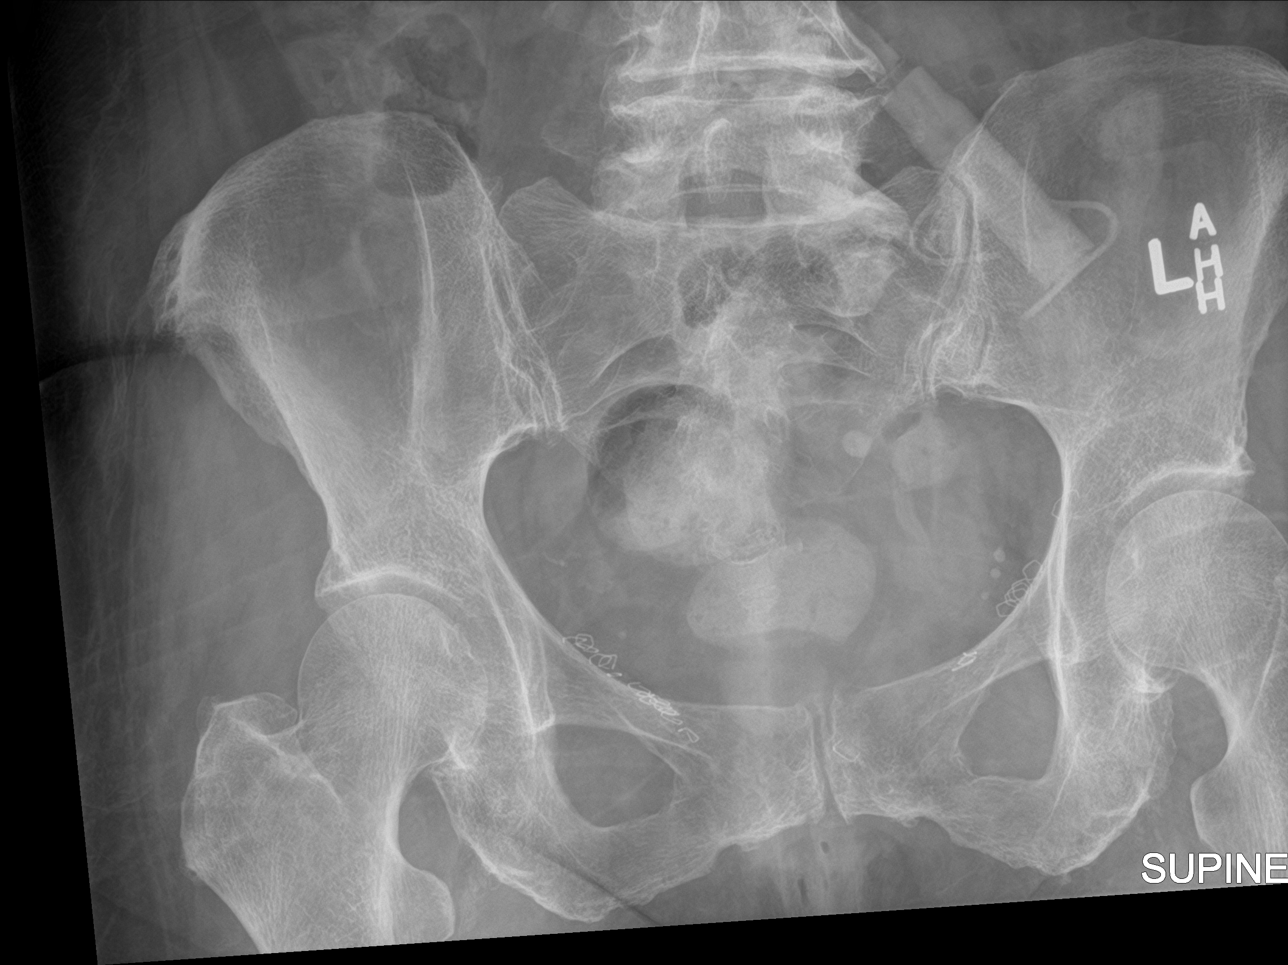

[abdomen decu]
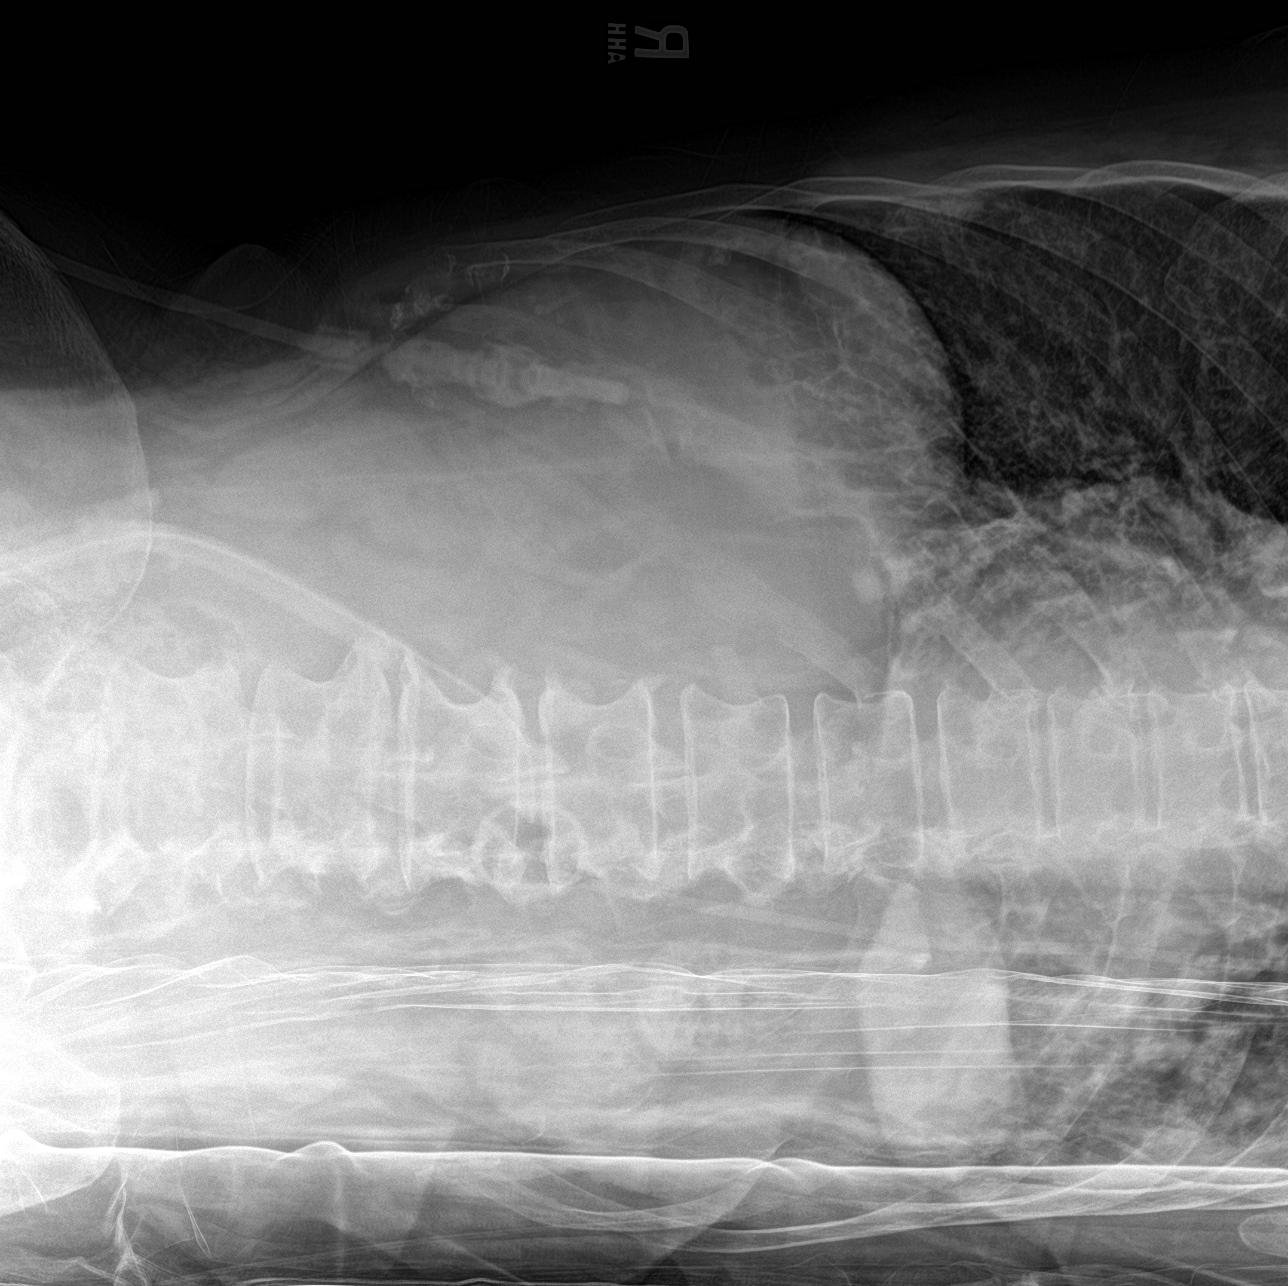

[3 of 3 positions shown; findings below may reference images not displayed]

FINDINGS: Nonobstructive bowel gas pattern. Gastrostomy tube in good position
overlying the gastric bubble. No acute bony abnormality. No evidence
of free air on the decubitus view. Chronic parenchymal changes in
the visualized lower lobes of the lungs.
IMPRESSION: No evidence of bowel obstruction or free air.

Gastrostomy tube appears to be in good position.

## 2018-01-18 ENCOUNTER — Ambulatory Visit (HOSPITAL_COMMUNITY)
Admission: RE | Admit: 2018-01-18 | Discharge: 2018-01-18 | Disposition: A | Discharge status: Home / Self Care | Payer: Medicare Other | Source: Ambulatory Visit | Attending: Otolaryngology | Admitting: Otolaryngology

## 2018-01-18 ENCOUNTER — Ambulatory Visit (HOSPITAL_COMMUNITY): Payer: Medicare Other | Admitting: Certified Registered"

## 2018-01-18 ENCOUNTER — Ambulatory Visit (HOSPITAL_COMMUNITY): Payer: Medicare Other | Admitting: Vascular Surgery

## 2018-01-18 ENCOUNTER — Encounter (HOSPITAL_COMMUNITY): Payer: Self-pay

## 2018-01-18 ENCOUNTER — Other Ambulatory Visit: Payer: Self-pay

## 2018-01-18 ENCOUNTER — Encounter (HOSPITAL_COMMUNITY)
Admission: RE | Disposition: A | Discharge status: Home / Self Care | Payer: Self-pay | Source: Ambulatory Visit | Attending: Otolaryngology

## 2018-01-18 DIAGNOSIS — Z923 Personal history of irradiation: Secondary | ICD-10-CM | POA: Diagnosis not present

## 2018-01-18 DIAGNOSIS — R1310 Dysphagia, unspecified: Secondary | ICD-10-CM | POA: Diagnosis not present

## 2018-01-18 DIAGNOSIS — E785 Hyperlipidemia, unspecified: Secondary | ICD-10-CM | POA: Diagnosis not present

## 2018-01-18 DIAGNOSIS — Z931 Gastrostomy status: Secondary | ICD-10-CM | POA: Diagnosis not present

## 2018-01-18 DIAGNOSIS — Z79899 Other long term (current) drug therapy: Secondary | ICD-10-CM | POA: Insufficient documentation

## 2018-01-18 DIAGNOSIS — I10 Essential (primary) hypertension: Secondary | ICD-10-CM | POA: Diagnosis not present

## 2018-01-18 DIAGNOSIS — K222 Esophageal obstruction: Secondary | ICD-10-CM | POA: Insufficient documentation

## 2018-01-18 DIAGNOSIS — Z79891 Long term (current) use of opiate analgesic: Secondary | ICD-10-CM | POA: Insufficient documentation

## 2018-01-18 DIAGNOSIS — Z85118 Personal history of other malignant neoplasm of bronchus and lung: Secondary | ICD-10-CM | POA: Diagnosis not present

## 2018-01-18 DIAGNOSIS — R1314 Dysphagia, pharyngoesophageal phase: Secondary | ICD-10-CM | POA: Diagnosis not present

## 2018-01-18 HISTORY — PX: ESOPHAGOSCOPY WITH DILITATION: SHX5618

## 2018-01-18 HISTORY — PX: DIRECT LARYNGOSCOPY: SHX5326

## 2018-01-18 LAB — SURGICAL PCR SCREEN
MRSA, PCR: POSITIVE — AB
Staphylococcus aureus: POSITIVE — AB

## 2018-01-18 SURGERY — LARYNGOSCOPY, DIRECT
Anesthesia: General | Site: Throat

## 2018-01-18 MED ORDER — CHLORHEXIDINE GLUCONATE CLOTH 2 % EX PADS: 6.0000 | MEDICATED_PAD | Freq: Once | CUTANEOUS | Status: DC

## 2018-01-18 MED ORDER — FENTANYL CITRATE (PF) 100 MCG/2ML IJ SOLN
INTRAMUSCULAR | Status: DC | PRN
  Administered 2018-01-18: 75 ug via INTRAVENOUS

## 2018-01-18 MED ORDER — FENTANYL CITRATE (PF) 100 MCG/2ML IJ SOLN: 25.0000 ug | INTRAMUSCULAR | Status: DC | PRN

## 2018-01-18 MED ORDER — PROPOFOL 10 MG/ML IV BOLUS
INTRAVENOUS | Status: DC | PRN
  Administered 2018-01-18: 100 mg via INTRAVENOUS

## 2018-01-18 MED ORDER — ONDANSETRON HCL 4 MG/2ML IJ SOLN: 4.0000 mg | Freq: Once | INTRAMUSCULAR | Status: DC | PRN

## 2018-01-18 MED ORDER — EPINEPHRINE HCL (NASAL) 0.1 % NA SOLN
NASAL | Status: AC
  Filled 2018-01-18: qty 30

## 2018-01-18 MED ORDER — OXYMETAZOLINE HCL 0.05 % NA SOLN
NASAL | Status: AC
Start: 2018-01-18 — End: ?
  Filled 2018-01-18: qty 15

## 2018-01-18 MED ORDER — ONDANSETRON HCL 4 MG/2ML IJ SOLN
INTRAMUSCULAR | Status: DC | PRN
  Administered 2018-01-18: 2 mg via INTRAVENOUS

## 2018-01-18 MED ORDER — EPINEPHRINE HCL (NASAL) 0.1 % NA SOLN
NASAL | Status: DC | PRN
  Administered 2018-01-18: 30 mL via TOPICAL

## 2018-01-18 MED ORDER — LIDOCAINE HCL (CARDIAC) PF 100 MG/5ML IV SOSY
PREFILLED_SYRINGE | INTRAVENOUS | Status: DC | PRN
  Administered 2018-01-18: 60 mg via INTRAVENOUS

## 2018-01-18 MED ORDER — MUPIROCIN 2 % EX OINT
TOPICAL_OINTMENT | CUTANEOUS | Status: AC
  Filled 2018-01-18: qty 22

## 2018-01-18 MED ORDER — SUGAMMADEX SODIUM 200 MG/2ML IV SOLN
INTRAVENOUS | Status: DC | PRN
  Administered 2018-01-18: 140 mg via INTRAVENOUS

## 2018-01-18 MED ORDER — SODIUM CHLORIDE 0.9 % IJ SOLN
INTRAMUSCULAR | Status: DC | PRN
  Administered 2018-01-18: 4 mL via SUBMUCOSAL

## 2018-01-18 MED ORDER — LACTATED RINGERS IV SOLN
INTRAVENOUS | Status: DC | PRN
  Administered 2018-01-18: 07:00:00 via INTRAVENOUS

## 2018-01-18 MED ORDER — DEXAMETHASONE SODIUM PHOSPHATE 10 MG/ML IJ SOLN
INTRAMUSCULAR | Status: DC | PRN
  Administered 2018-01-18: 10 mg via INTRAVENOUS

## 2018-01-18 MED ORDER — MIDAZOLAM HCL 2 MG/2ML IJ SOLN
INTRAMUSCULAR | Status: AC
Start: 2018-01-18 — End: ?
  Filled 2018-01-18: qty 2

## 2018-01-18 MED ORDER — TRIAMCINOLONE ACETONIDE 40 MG/ML IJ SUSP
INTRAMUSCULAR | Status: AC
Start: 2018-01-18 — End: ?
  Filled 2018-01-18: qty 5

## 2018-01-18 MED ORDER — PROPOFOL 10 MG/ML IV BOLUS
INTRAVENOUS | Status: AC
  Filled 2018-01-18: qty 40

## 2018-01-18 MED ORDER — SODIUM CHLORIDE 0.9 % IJ SOLN: INTRAMUSCULAR | Status: DC | PRN

## 2018-01-18 MED ORDER — ROCURONIUM BROMIDE 100 MG/10ML IV SOLN
INTRAVENOUS | Status: DC | PRN
Start: 2018-01-18 — End: 2018-01-18
  Administered 2018-01-18: 30 mg via INTRAVENOUS

## 2018-01-18 MED ORDER — SODIUM CHLORIDE 0.9 % IJ SOLN
INTRAMUSCULAR | Status: AC
  Filled 2018-01-18: qty 10

## 2018-01-18 MED ORDER — FENTANYL CITRATE (PF) 250 MCG/5ML IJ SOLN
INTRAMUSCULAR | Status: AC
  Filled 2018-01-18: qty 5

## 2018-01-18 MED ORDER — MIDAZOLAM HCL 5 MG/5ML IJ SOLN
INTRAMUSCULAR | Status: DC | PRN
  Administered 2018-01-18: 2 mg via INTRAVENOUS

## 2018-01-18 SURGICAL SUPPLY — 29 items
BALLN PULM 15 16.5 18 X 75CM (BALLOONS)
BALLN PULM 15 16.5 18X75 (BALLOONS)
BALLOON PULM 15 16.5 18X75 (BALLOONS) IMPLANT
BLADE SURG 15 STRL LF DISP TIS (BLADE) IMPLANT
BLADE SURG 15 STRL SS (BLADE)
CONT SPEC 4OZ CLIKSEAL STRL BL (MISCELLANEOUS) IMPLANT
COVER BACK TABLE 60X90IN (DRAPES) ×3 IMPLANT
COVER MAYO STAND STRL (DRAPES) ×3 IMPLANT
CRADLE DONUT ADULT HEAD (MISCELLANEOUS) IMPLANT
DRAPE HALF SHEET 40X57 (DRAPES) ×3 IMPLANT
GLOVE ECLIPSE 8.0 STRL XLNG CF (GLOVE) ×3 IMPLANT
GOWN BRE IMP SLV AUR LG STRL (GOWN DISPOSABLE) IMPLANT
GUARD TEETH (MISCELLANEOUS) ×3 IMPLANT
KIT BASIN OR (CUSTOM PROCEDURE TRAY) ×3 IMPLANT
KIT TURNOVER KIT B (KITS) IMPLANT
NDL HYPO 25GX1X1/2 BEV (NEEDLE) IMPLANT
NDL TRANS ORAL INJECTION (NEEDLE) IMPLANT
NEEDLE HYPO 25GX1X1/2 BEV (NEEDLE) IMPLANT
NEEDLE TRANS ORAL INJECTION (NEEDLE) ×3 IMPLANT
NS IRRIG 1000ML POUR BTL (IV SOLUTION) ×3 IMPLANT
PAD ARMBOARD 7.5X6 YLW CONV (MISCELLANEOUS) IMPLANT
PATTIES SURGICAL .5 X3 (DISPOSABLE) IMPLANT
SOLUTION ANTI FOG 6CC (MISCELLANEOUS) IMPLANT
SURGILUBE 2OZ TUBE FLIPTOP (MISCELLANEOUS) IMPLANT
SYR INFLATE BILIARY GAUGE (MISCELLANEOUS) IMPLANT
TOWEL OR 17X24 6PK STRL BLUE (TOWEL DISPOSABLE) ×3 IMPLANT
TUBE CONNECTING 12'X1/4 (SUCTIONS) ×1
TUBE CONNECTING 12X1/4 (SUCTIONS) ×2 IMPLANT
TUBING EXTENTION W/L.L. (IV SETS) ×2 IMPLANT

## 2018-01-18 NOTE — Anesthesia Preprocedure Evaluation (Addendum)
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   Reviewed: Allergy & Precautions, NPO status , Patient's Chart, lab work & pertinent test results  Airway Mallampati: II  TM Distance: >3 FB Neck ROM: Full    Dental  (+) Dental Advisory Given, Edentulous Upper, Edentulous Lower   Pulmonary neg pulmonary ROS,  H/o lung cancer   Pulmonary exam normal breath sounds clear to auscultation       Cardiovascular hypertension, Normal cardiovascular exam Rhythm:Regular Rate:Normal     Neuro/Psych negative neurological ROS     GI/Hepatic negative GI ROS, Neg liver ROS,   Endo/Other  negative endocrine ROS  Renal/GU negative Renal ROS     Musculoskeletal negative musculoskeletal ROS (+)   Abdominal   Peds  Hematology  (+) Blood dyscrasia, anemia ,   Anesthesia Other Findings Day of surgery medications reviewed with the patient.  Reproductive/Obstetrics                            Anesthesia Physical Anesthesia Plan  ASA: III  Anesthesia Plan: General   Post-op Pain Management:    Induction: Intravenous  PONV Risk Score and Plan: 3 and Ondansetron and Dexamethasone  Airway Management Planned: Oral ETT  Additional Equipment:   Intra-op Plan:   Post-operative Plan: Extubation in OR  Informed Consent: I have reviewed the patients History and Physical, chart, labs and discussed the procedure including the risks, benefits and alternatives for the proposed anesthesia with the patient or authorized representative who has indicated his/her understanding and acceptance.   Dental advisory given  Plan Discussed with: CRNA  Anesthesia Plan Comments:         Anesthesia Quick Evaluation

## 2018-01-18 NOTE — Progress Notes (Addendum)
Lab called to say that patient's lab result came back positive for MRSA. Mupirocin given to patient per protocol and patient educated regarding MRSA and Mupirocin application. Patient verbalized understanding.

## 2018-01-18 NOTE — Transfer of Care (Signed)
Immediate Anesthesia Transfer of Care Note  Patient: Debra Farrell  Procedure(s) Performed: DIRECT LARYNGOSCOPY (N/A Throat) ESOPHAGOSCOPY WITH DILITATION POSSIBLE BOTOX (N/A Throat)  Patient Location: PACU  Anesthesia Type:General  Level of Consciousness: awake, alert , oriented and patient cooperative  Airway & Oxygen Therapy: Patient Spontanous Breathing and Patient connected to face mask oxygen  Post-op Assessment: Report given to RN and Post -op Vital signs reviewed and stable  Post vital signs: Reviewed and stable  Last Vitals:  Vitals Value Taken Time  BP 145/63 01/18/2018  8:25 AM  Temp    Pulse 72 01/18/2018  8:27 AM  Resp 18 01/18/2018  8:27 AM  SpO2 100 % 01/18/2018  8:27 AM  Vitals shown include unvalidated device data.  Last Pain:  Vitals:   01/18/18 0608  TempSrc: Oral  PainSc: 0-No pain         Complications: No apparent anesthesia complications

## 2018-01-18 NOTE — Discharge Instructions (Signed)
Advance diet as comfortable.  Continue G-tube feeding to maintain nutrition and hydration. Recheck my office 1 mo. Please.  [phone] Call for excessive pain or aspiration.    Monitored Anesthesia Care, Care After These instructions provide you with information about caring for yourself after your procedure. Your health care provider may also give you more specific instructions. Your treatment has been planned according to current medical practices, but problems sometimes occur. Call your health care provider if you have any problems or questions after your procedure. What can I expect after the procedure? After your procedure, it is common to:  Feel sleepy for several hours.  Feel clumsy and have poor balance for several hours.  Feel forgetful about what happened after the procedure.  Have poor judgment for several hours.  Feel nauseous or vomit.  Have a sore throat if you had a breathing tube during the procedure.  Follow these instructions at home: For at least 24 hours after the procedure:   Do not: ? Participate in activities in which you could fall or become injured. ? Drive. ? Use heavy machinery. ? Drink alcohol. ? Take sleeping pills or medicines that cause drowsiness. ? Make important decisions or sign legal documents. ? Take care of children on your own.  Rest. Eating and drinking  Follow the diet that is recommended by your health care provider.  If you vomit, drink water, juice, or soup when you can drink without vomiting.  Make sure you have little or no nausea before eating solid foods. General instructions  Have a responsible adult stay with you until you are awake and alert.  Take over-the-counter and prescription medicines only as told by your health care provider.  If you smoke, do not smoke without supervision.  Keep all follow-up visits as told by your health care provider. This is important. Contact a health care provider if:  You keep  feeling nauseous or you keep vomiting.  You feel light-headed.  You develop a rash.  You have a fever. Get help right away if:  You have trouble breathing. This information is not intended to replace advice given to you by your health care provider. Make sure you discuss any questions you have with your health care provider. Document Released: 01/03/2016 Document Revised: 05/04/2016 Document Reviewed: 01/03/2016 Elsevier Interactive Patient Education  [employer].

## 2018-01-18 NOTE — Progress Notes (Signed)
Phonation and swallowing with no difficulties

## 2018-01-18 NOTE — Interval H&P Note (Signed)
History and Physical Interval Note:  01/18/2018 7:38 AM  Debra Farrell  has presented today for surgery, with the diagnosis of ESOPHAGEAL STENOSIS  The various methods of treatment have been discussed with the patient and family. After consideration of risks, benefits and other options for treatment, the patient has consented to  Procedure(s): DIRECT LARYNGOSCOPY (N/A) ESOPHAGOSCOPY WITH DILITATION POSSIBLE BOTOX (N/A) as a surgical intervention .  The patient's history has been re-reviewed, patient re-examined, no change in status, stable for surgery.  I have re-reviewed the patient's chart and labs.  Questions were answered to the patient's satisfaction.     [name]

## 2018-01-18 NOTE — Anesthesia Procedure Notes (Signed)
Procedure Name: Intubation Date/Time: 01/18/2018 7:47 AM Performed by: Cleda Daub, CRNA Pre-anesthesia Checklist: Patient identified, Emergency Drugs available, Suction available and Patient being monitored Patient Re-evaluated:Patient Re-evaluated prior to induction Oxygen Delivery Method: Circle system utilized Preoxygenation: Pre-oxygenation with 100% oxygen Induction Type: IV induction Ventilation: Mask ventilation without difficulty and Mask ventilation throughout procedure Laryngoscope Size: Mac and 3 Grade View: Grade I Tube type: Oral Tube size: 6.5 mm Number of attempts: 1 Airway Equipment and Method: Stylet Placement Confirmation: ETT inserted through vocal cords under direct vision Secured at: 20 cm Tube secured with: Tape Dental Injury: Teeth and Oropharynx as per pre-operative assessment

## 2018-01-18 NOTE — Op Note (Signed)
01/18/2018  8:15 AM    Debra Farrell  [id number]   Pre-Op Dx: Dysphagia with Esophageal stenosis.  Post-op Dx: Same  Proc: Direct laryngoscopy, esophagoscopy, cricopharyngeus Botox injection.   Surg:  [name] T MD  Anes:  GOT  EBL:  None  Comp:  None  Findings:  Prominent cricopharyngeus band. Perhaps mild mid esophageal narrowing as it was visualized on barium swallow, but admitting the full sized cervical and full length adult esophagoscopes without difficulty.  No evidence of recurrent cancer.  Procedure: With the patient in a comfortable supine position, general orotracheal anesthesia was induced without difficulty.  At an appropriate level, the table was turned 90 the patient placed in slight reverse Trendelenburg.  A routine surgical timeout was obtained.  A moist 4 x 4 was used to protect the upper gums. Direct laryngoscope was introduced and full inspection was performed with no significant findings. The laryngoscope was removed.  Cervical esophagoscope was lubricated, passed into the hypopharynx under direct vision, passed easily through the cricopharyngeus and down to its full length. No significant findings. The scope was removed.  The full length esophagoscope was lubricated, passed to 45 cm without difficulty and with no significant findings. The scope was removed.  The direct laryngoscope was reintroduced and poised in hypopharynx.  Botox, 100 units, was injected into the cricopharyngeus muscle in 3 separate locations. The laryngoscope was removed.  At this point the procedure was completed. The patient was returned to anesthesia and awakened, extubated, and transferred to recovery in stable condition.  Dispo:   PACU to home  Plan:  Advance diet as comfortable. Recheck my office one month.  Tyson Alias MD

## 2018-01-19 ENCOUNTER — Encounter (HOSPITAL_COMMUNITY): Payer: Self-pay | Admitting: Otolaryngology

## 2018-01-19 NOTE — Anesthesia Postprocedure Evaluation (Signed)
Anesthesia Post Note  Patient: Debra Farrell  Procedure(s) Performed: DIRECT LARYNGOSCOPY (N/A Throat) ESOPHAGOSCOPY WITH DILITATION POSSIBLE BOTOX (N/A Throat)     Patient location during evaluation: PACU Anesthesia Type: General Level of consciousness: awake and alert Pain management: pain level controlled Vital Signs Assessment: post-procedure vital signs reviewed and stable Respiratory status: spontaneous breathing, nonlabored ventilation and respiratory function stable Cardiovascular status: blood pressure returned to baseline and stable Postop Assessment: no apparent nausea or vomiting Anesthetic complications: no    Last Vitals:  Vitals:   01/18/18 0856 01/18/18 0910  BP: (!) 143/70 (!) 137/58  Pulse: 62 (!) 57  Resp: 19 19  Temp: 36.7 C 36.6 C  SpO2: 100% 100%    Last Pain:  Vitals:   01/18/18 0910  TempSrc: Oral  PainSc: 0-No pain                 Catalina Gravel

## 2018-01-22 DIAGNOSIS — R1314 Dysphagia, pharyngoesophageal phase: Secondary | ICD-10-CM | POA: Diagnosis not present

## 2018-01-26 IMAGING — XA IR GI TUBE CONTRAST INJECTION
1 series · 3 of 3 positions shown · non-contrast
Comparison: none

INDICATION: Percutaneous gastrostomy tube placed 11/05/2015. Concern for
possible leak.

[Series 300: line placements · 3 of 3 slices shown]
[im 1/3]
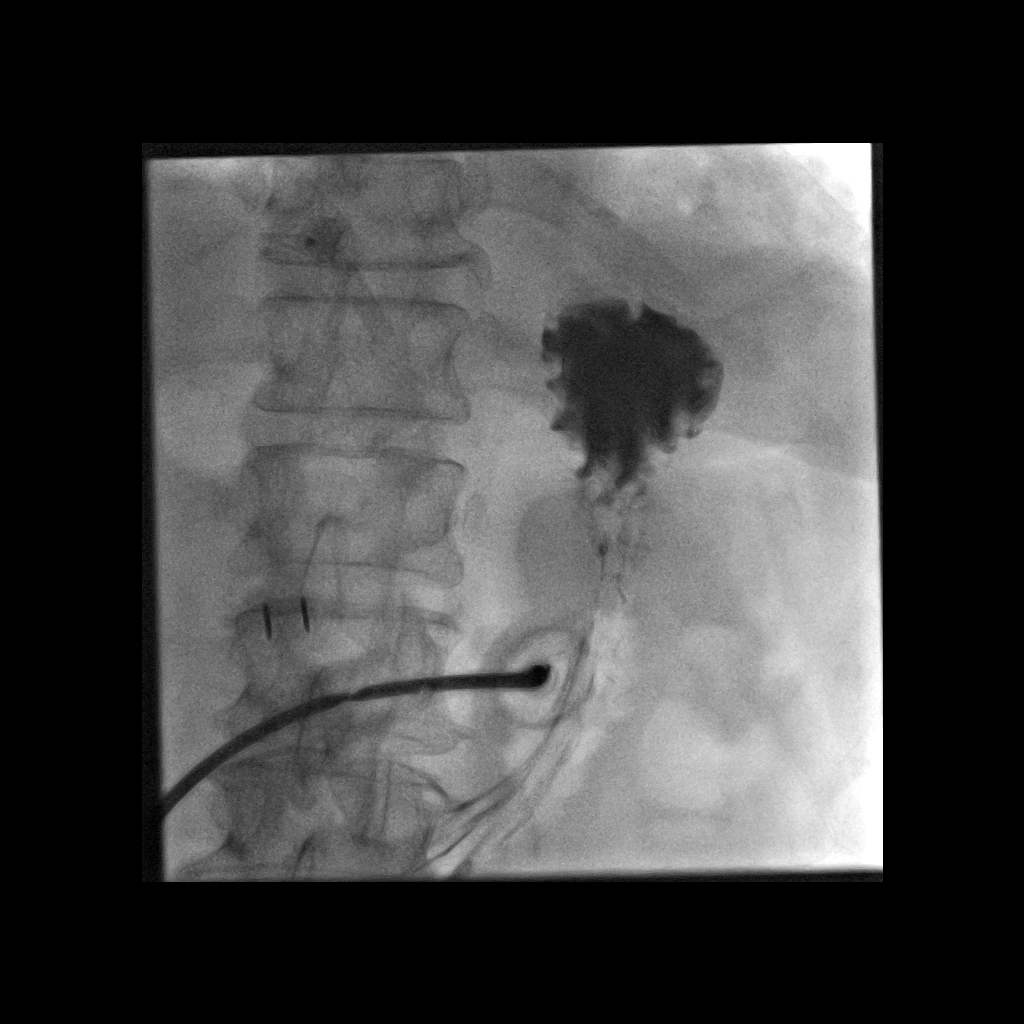
[im 2/3]
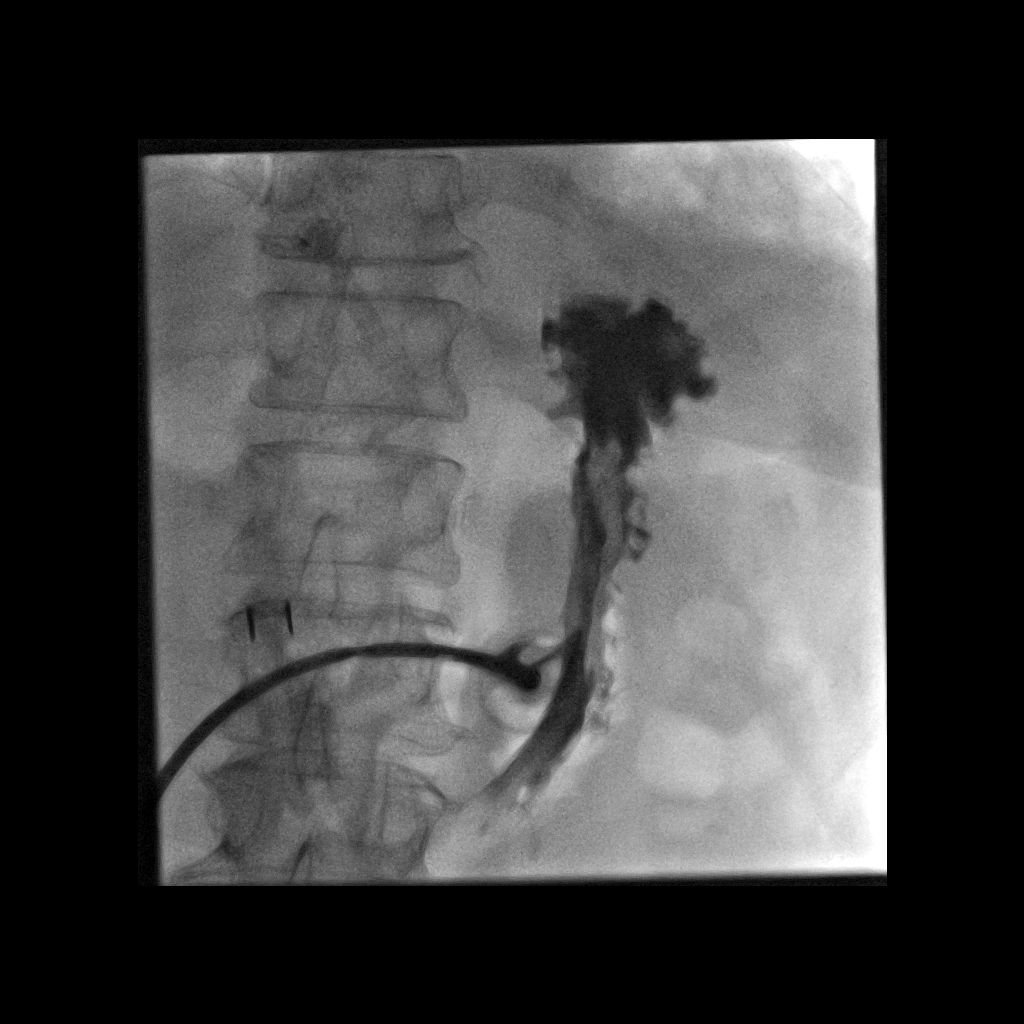
[im 3/3]
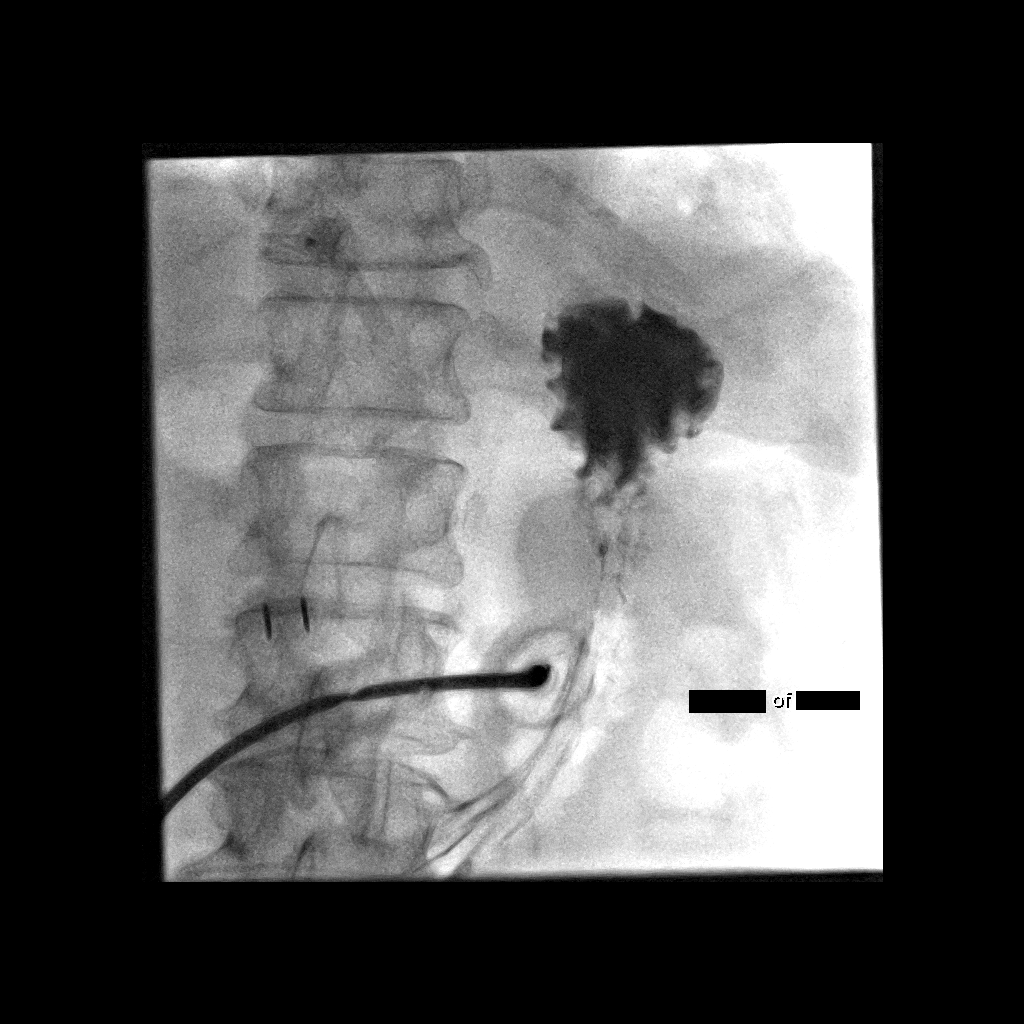

[3 of 3 positions shown; findings below may reference images not displayed]

EXAM:
GI TUBE INJECTION

MEDICATIONS:
None; Antibiotics were administered within 1 hour of the procedure.
Glucagon 1 mg IV

ANESTHESIA/SEDATION:
Moderate Sedation Time:  None

The patient was continuously monitored during the procedure by the
interventional radiology nurse under my direct supervision.

CONTRAST:  10 mL Omnipaque 300 - administered into the gastric
lumen.

FLUOROSCOPY TIME:  Fluoroscopy Time: 6 seconds

COMPLICATIONS:
None immediate.

PROCEDURE:
Informed written consent was obtained from the patient after a
thorough discussion of the procedural risks, benefits and
alternatives. All questions were addressed. Maximal Sterile Barrier
Technique was utilized including caps, mask, sterile gowns, sterile
gloves, sterile drape, hand hygiene and skin antiseptic. A timeout
was performed prior to the initiation of the procedure.

Gastrostomy tube was inspected and found to be without obvious
defects. The tube was then injected with approximately 10 cc of
contrast which promptly filled the gastric lumen. Several images
were taken confirming no evidence of leak or obstruction.
Gastrostomy tube is in proper position.
IMPRESSION: Properly position the gastrostomy tube without evidence of leak.

## 2018-02-12 ENCOUNTER — Inpatient Hospital Stay: Payer: Medicare Other | Attending: Hematology and Oncology

## 2018-02-12 DIAGNOSIS — C01 Malignant neoplasm of base of tongue: Secondary | ICD-10-CM | POA: Insufficient documentation

## 2018-02-12 DIAGNOSIS — Z452 Encounter for adjustment and management of vascular access device: Secondary | ICD-10-CM | POA: Diagnosis not present

## 2018-02-12 DIAGNOSIS — C7800 Secondary malignant neoplasm of unspecified lung: Secondary | ICD-10-CM | POA: Insufficient documentation

## 2018-02-12 MED ORDER — HEPARIN SOD (PORK) LOCK FLUSH 100 UNIT/ML IV SOLN
500.0000 [IU] | Freq: Once | INTRAVENOUS | Status: AC | PRN
  Administered 2018-02-12: 500 [IU] via INTRAVENOUS
  Filled 2018-02-12: qty 5

## 2018-02-12 MED ORDER — SODIUM CHLORIDE 0.9 % IJ SOLN
10.0000 mL | INTRAMUSCULAR | Status: DC | PRN
  Administered 2018-02-12: 10 mL via INTRAVENOUS
  Filled 2018-02-12: qty 10

## 2018-03-16 ENCOUNTER — Encounter (HOSPITAL_COMMUNITY): Payer: Self-pay

## 2018-03-16 ENCOUNTER — Ambulatory Visit (HOSPITAL_COMMUNITY)
Admission: EM | Admit: 2018-03-16 | Discharge: 2018-03-16 | Disposition: A | Discharge status: Home / Self Care | Payer: Medicare Other | Attending: Family Medicine | Admitting: Family Medicine

## 2018-03-16 DIAGNOSIS — L509 Urticaria, unspecified: Secondary | ICD-10-CM

## 2018-03-16 MED ORDER — PREDNISONE 20 MG PO TABS
ORAL_TABLET | ORAL | Status: AC
  Filled 2018-03-16: qty 1

## 2018-03-16 MED ORDER — PREDNISONE 20 MG PO TABS
20.0000 mg | ORAL_TABLET | Freq: Once | ORAL | Status: AC
  Administered 2018-03-16: 20 mg via ORAL

## 2018-03-16 MED ORDER — PREDNISONE 20 MG PO TABS: ORAL_TABLET | ORAL | 0 refills | Status: DC

## 2018-03-16 MED FILL — predniSONE 20 MG TABS: 20 | 9 days supply | Qty: 18 | Fill #0

## 2018-03-16 NOTE — ED Provider Notes (Signed)
[facility]    CSN: [id number] Arrival date & time: 03/16/18  1000     History   Chief Complaint Chief Complaint  Patient presents with  . Allergic Reaction    HPI Debra Farrell is a 72 y.o. female.   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  It itches terribly.  Is been present for a couple of days, but she states this morning when she woke up she could not stand it anymore so came to the urgent care for evaluation.  She states that started with poison ivy, from working in the yard.  These were the usual streaks of little blisters that she gets, and she was using cortisone cream.  Because it was itching more than usual she used a Benadryl gel.  This seemed to make it worse and then she broke out in the hives. We discussed urticaria.  No known new products on the skin, foods, medicines, supplements, lotions. Did review her extensive medical history.  Her medications.  Her cancer, her recovery, her treatment, her tube feedings.   Past Medical History:  Diagnosis Date  . Abdominal abscess 11/30/2015  . Anemia in chronic illness 12/16/2015  . Cancer of base of tongue ([place]) 10/14/2015  . Eczema   . GERD (gastroesophageal reflux disease)   . Hx of radiation therapy 11/11/15- 01/05/2016   Base of Tongue and bilateral neck  . Hx of radiation therapy 03/14/16- 03/23/16   Left Lower Lung  . Hyperlipidemia   . Hypertension    no meds  . Infection with methicillin-resistant Staphylococcus aureus (MRSA) 11/30/2015  . Intractable nausea and vomiting 11/13/2015  . Laceration 04/2014   around R ear, for falling out of bed & hitting nightstand  . Nausea without vomiting 12/16/2015  . Thalassemia minor   . Throat pain in adult 12/29/2015    Patient Active Problem List   Diagnosis Date Noted  . Other fatigue 04/07/2017  . 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 Granulation tissue of skin 09/02/2016  . Pancytopenia, acquired ([place]) 09/02/2016  .  Preventive measure 09/02/2016  . Cancer associated pain 09/02/2016  . Lymphedema in adult patient 05/13/2016  . S/P gastrostomy ([place]) 03/16/2016  . Malignant neoplasm of lower lobe of left lung ([place]) 02/17/2016  . Malnutrition of moderate degree 01/04/2016  . Goals of care, counseling/discussion   . Dysphagia 12/31/2015  . 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 Encounter for palliative care   . Odynophagia   . Throat pain in adult 12/29/2015  . Nausea without vomiting 12/16/2015  . Anemia in chronic illness 12/16/2015  . Anemia due to other cause   . Dehydration   . Severe protein-calorie malnutrition ([place]) 11/14/2015  . Intractable nausea and vomiting 11/13/2015  . Gastritis, acute 11/13/2015  . Constipation due to opioid therapy 11/10/2015  . Cancer of base of tongue ([place]) 10/14/2015  . Skin lesion of back 03/21/2013  . Lipoma of axilla 03/21/2013  . Sleep disorder 03/17/2013  . Skin lesion 03/17/2013  . Health care maintenance 03/17/2013  . Right knee pain 04/05/2012  . Health maintenance examination 10/17/2011  . Thalassemia 10/17/2011  . Essential hypertension 10/17/2011  . Hyperlipidemia 10/17/2011  . Headache(784.0) 10/17/2011    Past Surgical History:  Procedure Laterality Date  . ABDOMINAL HYSTERECTOMY  1990   partial  . DIRECT LARYNGOSCOPY N/A 10/28/2015   Procedure: DIRECT LARYNGOSCOPY WITH BIOPSY;  Surgeon: [name], MD;  Location: [facility];  Service: ENT;  Laterality: N/A;  . DIRECT LARYNGOSCOPY N/A 03/13/2017   Procedure: DIRECT LARYNGOSCOPY;  Surgeon: [name], MD;  Location: [facility];  Service: ENT;  Laterality: N/A;  . DIRECT LARYNGOSCOPY N/A 01/18/2018   Procedure: DIRECT LARYNGOSCOPY;  Surgeon: [name], MD;  Location: [facility];  Service: ENT;  Laterality: N/A;  . ESOPHAGOSCOPY N/A 10/28/2015   Procedure: ESOPHAGOSCOPY;  Surgeon: [name], MD;  Location: [facility] OR;  Service: ENT;  Laterality: N/A;  . ESOPHAGOSCOPY WITH  DILITATION N/A 03/13/2017   Procedure: ESOPHAGOSCOPY WITH DILITATION;  Surgeon: [name], MD;  Location: [facility];  Service: ENT;  Laterality: N/A;  . ESOPHAGOSCOPY WITH DILITATION N/A 01/18/2018   Procedure: ESOPHAGOSCOPY WITH DILITATION POSSIBLE BOTOX;  Surgeon: [name], MD;  Location: [facility];  Service: ENT;  Laterality: N/A;  . [place], [id number]  . IR REPLACE G-TUBE SIMPLE WO FLUORO  06/19/2017  . IR REPLACE G-TUBE SIMPLE WO FLUORO  09/05/2017  . IR REPLACE G-TUBE SIMPLE WO FLUORO  12/21/2017  . LARYNGOSCOPY AND BRONCHOSCOPY N/A 10/28/2015   Procedure: BRONCHOSCOPY;  Surgeon: [name], MD;  Location: [facility];  Service: ENT;  Laterality: N/A;  . MULTIPLE EXTRACTIONS WITH ALVEOLOPLASTY N/A 10/28/2015   Procedure: Extraction of tooth #'s 2-12, 14,15,17,18,20-29, 31 with alveoloplasy and mandibular left torus reduction;  Surgeon: Lenn Cal, DDS;  Location: [facility];  Service: Oral Surgery;  Laterality: N/A;  . port-a-cath insertion    . RECTOCELE REPAIR    . TONSILLECTOMY     as a child  . urocele     correction surgery    OB History   None      Home Medications    Prior to Admission medications   Medication Sig Start Date End Date Taking? Authorizing Provider  lidocaine-prilocaine (EMLA) cream Apply 1 application topically as needed. Patient taking differently: Apply 1 application topically as needed (prior to port being accessed).  10/10/17   [name], MD  Nutritional Supplements (PROMOD) LIQD Take 1.5 oz by mouth daily.     [provider]  Nutritional Supplements (TWOCAL HN) LIQD Take 237 mLs by mouth 2 (two) times daily.     [provider]  predniSONE (DELTASONE) 20 MG tablet Three a day for 3 days, 2 a day for 3 days then one a day until gone 03/16/18   Raylene Everts, MD    Family History Family History  Problem Relation Age of Onset  . Cancer Mother        lung  . Asthma Father     Social  History Social History   Tobacco Use  . Smoking status: Never Smoker  . Smokeless tobacco: Never Used  Substance Use Topics  . Alcohol use: No  . Drug use: No     Allergies   Chlorhexidine gluconate and Morphine and related   Review of Systems Review of Systems  Constitutional: Negative for chills and fever.  HENT: Positive for trouble swallowing. Negative for congestion, ear pain and sore throat.   Eyes: Negative for pain and visual disturbance.  Respiratory: Negative for cough and shortness of breath.   Cardiovascular: Negative for chest pain and palpitations.  Gastrointestinal: Negative for abdominal pain and vomiting.  Genitourinary: Negative for dysuria and hematuria.  Musculoskeletal: Negative for arthralgias and back pain.  Skin: Positive for rash. Negative for color change.  Neurological: Negative for seizures and syncope.  Psychiatric/Behavioral: Positive for sleep disturbance.  All other  systems reviewed and are negative.    Physical Exam Triage Vital Signs ED Triage Vitals  Enc Vitals Group     BP 03/16/18 1016 138/72     Pulse Rate 03/16/18 1016 75     Resp 03/16/18 1016 16     Temp 03/16/18 1016 98.6 F (37 C)     Temp Source 03/16/18 1016 Oral     SpO2 03/16/18 1016 97 %     Weight --      Height --      Head Circumference --      Peak Flow --      Pain Score 03/16/18 1019 1     Pain Loc --      Pain Edu? --      Excl. in [place]? --    No data found.  Updated Vital Signs BP 138/72 (BP Location: Left Arm)   Pulse 75   Temp 98.6 F (37 C) (Oral)   Resp 16   SpO2 97%      Physical Exam  Constitutional: She appears well-developed. No distress.  Lean in appearance.  Uncomfortable.  HENT:  Head: Normocephalic and atraumatic.  Right Ear: External ear normal.  Left Ear: External ear normal.  Mouth/Throat: Oropharynx is clear and moist.  Edentulous  Eyes: Pupils are equal, round, and reactive to light. Conjunctivae are normal.  Neck: Normal  range of motion.  Cardiovascular: Normal rate, regular rhythm and normal heart sounds.  Pulmonary/Chest: Effort normal and breath sounds normal. No respiratory distress. She has no wheezes.  Abdominal: Soft. She exhibits no distension.  Musculoskeletal: Normal range of motion. She exhibits no edema.  Neurological: She is alert.  Skin: Skin is warm and dry.  Patches of contact dermatitis on both lower legs.  Urticarial wheals on face, neck, chest, arms and legs scattered.  Some on forearms are excoriated.  Some soft tissue swelling on forearms.     UC Treatments / Results  Labs (all labs ordered are listed, but only abnormal results are displayed) Labs Reviewed - No data to display  EKG None  Radiology No results found.  Procedures Procedures (including critical care time)  Medications Ordered in UC Medications  predniSONE (DELTASONE) tablet 20 mg (20 mg Oral Given 03/16/18 1047)    Initial Impression / Assessment and Plan / UC Course  I have reviewed the triage vital signs and the nursing notes.  Pertinent labs & imaging results that were available during my care of the patient were reviewed by me and considered in my medical decision making (see chart for details).      Final Clinical Impressions(s) / UC Diagnoses   Final diagnoses:  Urticaria     Discharge Instructions     Prednisone as directed Benadryl for itching May use zyrtec during day Return if needed   ED Prescriptions    Medication Sig Dispense Auth. Provider   predniSONE (DELTASONE) 20 MG tablet Three a day for 3 days, 2 a day for 3 days then one a day until gone 18 tablet Meda Coffee Jennette Banker, MD     Controlled Substance Prescriptions [place] Controlled Substance Registry consulted? Not Applicable   Raylene Everts, MD 03/16/18 2122

## 2018-03-16 NOTE — Discharge Instructions (Signed)
Prednisone as directed Benadryl for itching May use zyrtec during day Return if needed

## 2018-03-16 NOTE — ED Triage Notes (Signed)
Pt states she started having a rash from a few days ago, doing yard work and its progressively got worse over the past few days.

## 2018-03-19 ENCOUNTER — Inpatient Hospital Stay: Payer: Medicare Other | Attending: Hematology and Oncology

## 2018-03-19 ENCOUNTER — Inpatient Hospital Stay: Payer: Medicare Other

## 2018-03-19 ENCOUNTER — Other Ambulatory Visit: Payer: Self-pay | Admitting: Hematology and Oncology

## 2018-03-19 ENCOUNTER — Ambulatory Visit (HOSPITAL_COMMUNITY)
Admission: RE | Admit: 2018-03-19 | Discharge: 2018-03-19 | Disposition: A | Discharge status: Home / Self Care | Payer: Medicare Other | Source: Ambulatory Visit | Attending: Hematology and Oncology | Admitting: Hematology and Oncology

## 2018-03-19 ENCOUNTER — Encounter (HOSPITAL_COMMUNITY): Payer: Self-pay

## 2018-03-19 DIAGNOSIS — C01 Malignant neoplasm of base of tongue: Secondary | ICD-10-CM

## 2018-03-19 DIAGNOSIS — I700 Atherosclerosis of aorta: Secondary | ICD-10-CM | POA: Insufficient documentation

## 2018-03-19 DIAGNOSIS — E039 Hypothyroidism, unspecified: Secondary | ICD-10-CM

## 2018-03-19 DIAGNOSIS — R1310 Dysphagia, unspecified: Secondary | ICD-10-CM | POA: Diagnosis not present

## 2018-03-19 DIAGNOSIS — Z452 Encounter for adjustment and management of vascular access device: Secondary | ICD-10-CM | POA: Diagnosis not present

## 2018-03-19 DIAGNOSIS — J984 Other disorders of lung: Secondary | ICD-10-CM | POA: Diagnosis not present

## 2018-03-19 DIAGNOSIS — R918 Other nonspecific abnormal finding of lung field: Secondary | ICD-10-CM | POA: Insufficient documentation

## 2018-03-19 DIAGNOSIS — C7800 Secondary malignant neoplasm of unspecified lung: Secondary | ICD-10-CM | POA: Insufficient documentation

## 2018-03-19 DIAGNOSIS — Z9221 Personal history of antineoplastic chemotherapy: Secondary | ICD-10-CM | POA: Insufficient documentation

## 2018-03-19 DIAGNOSIS — J181 Lobar pneumonia, unspecified organism: Secondary | ICD-10-CM | POA: Diagnosis not present

## 2018-03-19 DIAGNOSIS — Z923 Personal history of irradiation: Secondary | ICD-10-CM | POA: Insufficient documentation

## 2018-03-19 DIAGNOSIS — R634 Abnormal weight loss: Secondary | ICD-10-CM | POA: Insufficient documentation

## 2018-03-19 DIAGNOSIS — C7802 Secondary malignant neoplasm of left lung: Secondary | ICD-10-CM

## 2018-03-19 DIAGNOSIS — D569 Thalassemia, unspecified: Secondary | ICD-10-CM | POA: Insufficient documentation

## 2018-03-19 LAB — CBC WITH DIFFERENTIAL/PLATELET
BASOS PCT: 1 %
Basophils Absolute: 0 10*3/uL (ref 0.0–0.1)
EOS ABS: 0.6 10*3/uL — AB (ref 0.0–0.5)
Eosinophils Relative: 9 %
HCT: 27.6 % — ABNORMAL LOW (ref 34.8–46.6)
Hemoglobin: 8.6 g/dL — ABNORMAL LOW (ref 11.6–15.9)
Lymphocytes Relative: 6 %
Lymphs Abs: 0.4 10*3/uL — ABNORMAL LOW (ref 0.9–3.3)
MCH: 21 pg — ABNORMAL LOW (ref 25.1–34.0)
MCHC: 31.2 g/dL — AB (ref 31.5–36.0)
MCV: 67.3 fL — ABNORMAL LOW (ref 79.5–101.0)
Monocytes Absolute: 0.7 10*3/uL (ref 0.1–0.9)
Monocytes Relative: 11 %
NEUTROS PCT: 73 %
Neutro Abs: 4.7 10*3/uL (ref 1.5–6.5)
Platelets: 190 10*3/uL (ref 145–400)
RBC: 4.1 MIL/uL (ref 3.70–5.45)
RDW: 15.2 % — ABNORMAL HIGH (ref 11.2–14.5)
WBC: 6.4 10*3/uL (ref 3.9–10.3)

## 2018-03-19 LAB — TSH: TSH: 2.013 u[IU]/mL (ref 0.308–3.960)

## 2018-03-19 LAB — COMPREHENSIVE METABOLIC PANEL
ALBUMIN: 4 g/dL (ref 3.5–5.0)
ALT: 46 U/L (ref 0–55)
ANION GAP: 7 (ref 3–11)
AST: 48 U/L — ABNORMAL HIGH (ref 5–34)
Alkaline Phosphatase: 72 U/L (ref 40–150)
BUN: 31 mg/dL — ABNORMAL HIGH (ref 7–26)
CO2: 29 mmol/L (ref 22–29)
Calcium: 9.5 mg/dL (ref 8.4–10.4)
Chloride: 102 mmol/L (ref 98–109)
Creatinine, Ser: 1.04 mg/dL (ref 0.60–1.10)
GFR calc Af Amer: >60 mL/min (ref >60)
GFR calc non Af Amer: 53 mL/min — ABNORMAL LOW (ref >60)
Glucose, Bld: 118 mg/dL (ref 70–140)
POTASSIUM: 3.6 mmol/L (ref 3.5–5.1)
SODIUM: 138 mmol/L (ref 136–145)
Total Bilirubin: 0.6 mg/dL (ref 0.2–1.2)
Total Protein: 6.3 g/dL — ABNORMAL LOW (ref 6.4–8.3)

## 2018-03-19 MED ORDER — SODIUM CHLORIDE 0.9 % IJ SOLN
10.0000 mL | INTRAMUSCULAR | Status: DC | PRN
Start: 2018-03-19 — End: 2018-03-19
  Administered 2018-03-19: 10 mL via INTRAVENOUS
  Filled 2018-03-19: qty 10

## 2018-03-19 MED ORDER — HEPARIN SOD (PORK) LOCK FLUSH 100 UNIT/ML IV SOLN
500.0000 [IU] | Freq: Once | INTRAVENOUS | Status: AC
  Administered 2018-03-19: 500 [IU] via INTRAVENOUS

## 2018-03-19 MED ORDER — HEPARIN SOD (PORK) LOCK FLUSH 100 UNIT/ML IV SOLN
INTRAVENOUS | Status: AC
  Filled 2018-03-19: qty 5

## 2018-03-19 MED ORDER — IOHEXOL 300 MG/ML  SOLN
75.0000 mL | Freq: Once | INTRAMUSCULAR | Status: AC | PRN
  Administered 2018-03-19: 75 mL via INTRAVENOUS

## 2018-03-19 MED ORDER — HEPARIN SOD (PORK) LOCK FLUSH 100 UNIT/ML IV SOLN
500.0000 [IU] | Freq: Once | INTRAVENOUS | Status: DC | PRN
  Filled 2018-03-19: qty 5

## 2018-03-20 ENCOUNTER — Inpatient Hospital Stay (HOSPITAL_BASED_OUTPATIENT_CLINIC_OR_DEPARTMENT_OTHER): Payer: Medicare Other | Admitting: Hematology and Oncology

## 2018-03-20 ENCOUNTER — Telehealth: Payer: Self-pay | Admitting: Hematology and Oncology

## 2018-03-20 ENCOUNTER — Encounter: Payer: Self-pay | Admitting: Hematology and Oncology

## 2018-03-20 VITALS — BP 137/64 | HR 65 | Temp 98.6°F | Resp 18 | Ht 66.0 in | Wt 126.2 lb

## 2018-03-20 DIAGNOSIS — D569 Thalassemia, unspecified: Secondary | ICD-10-CM | POA: Diagnosis not present

## 2018-03-20 DIAGNOSIS — Z9221 Personal history of antineoplastic chemotherapy: Secondary | ICD-10-CM | POA: Diagnosis not present

## 2018-03-20 DIAGNOSIS — C7800 Secondary malignant neoplasm of unspecified lung: Secondary | ICD-10-CM

## 2018-03-20 DIAGNOSIS — Z923 Personal history of irradiation: Secondary | ICD-10-CM

## 2018-03-20 DIAGNOSIS — R634 Abnormal weight loss: Secondary | ICD-10-CM

## 2018-03-20 DIAGNOSIS — C01 Malignant neoplasm of base of tongue: Secondary | ICD-10-CM

## 2018-03-20 DIAGNOSIS — C7802 Secondary malignant neoplasm of left lung: Secondary | ICD-10-CM

## 2018-03-20 DIAGNOSIS — D568 Other thalassemias: Secondary | ICD-10-CM

## 2018-03-20 DIAGNOSIS — R1310 Dysphagia, unspecified: Secondary | ICD-10-CM

## 2018-03-20 NOTE — Assessment & Plan Note (Signed)
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 The patient denies recent history of bleeding such as epistaxis, hematuria or hematochezia. She is asymptomatic from the anemia. We will observe for now.

## 2018-03-20 NOTE — Assessment & Plan Note (Signed)
I have review of CT imaging with the patient and daughter in great detail She has lost some weight I am concerned about disease relapse I recommend PET CT scan for complete staging and she agreed to proceed

## 2018-03-20 NOTE — Assessment & Plan Note (Signed)
She denies cough, chest pain or shortness of breath CT imaging showed more nodular density I recommend PET CT scan for evaluation and she agreed to proceed

## 2018-03-20 NOTE — Telephone Encounter (Signed)
Gave avs and calendar ° °

## 2018-03-20 NOTE — Assessment & Plan Note (Signed)
She has abnormal weight loss due to poor oral intake Malignancy induced cachexia cannot be excluded For now, I recommend frequent small meals as tolerated

## 2018-03-20 NOTE — Progress Notes (Signed)
Debra Farrell OFFICE PROGRESS NOTE  Patient Care Team: Default, Provider, MD as PCP - General Debra Sauers, RN as Oncology Nurse Navigator [name], MD as Consulting Physician (Hematology and Oncology) [name], MD as Attending Physician (Radiation Oncology) [name], RD as Dietitian (Nutrition)  ASSESSMENT & PLAN:  Cancer of base of tongue ([place]) I have review of CT imaging with the patient and daughter in great detail She has lost some weight I am concerned about disease relapse I recommend PET CT scan for complete staging and she agreed to proceed  Metastasis to lung [facility]) She denies cough, chest pain or shortness of breath CT imaging showed more nodular density I recommend PET CT scan for evaluation and she agreed to proceed   Thalassemia This is likely anemia of chronic disease with background of thalassemia. The patient denies recent history of bleeding such as epistaxis, hematuria or hematochezia. She is asymptomatic from the anemia. We will observe for now.     Weight loss, abnormal She has abnormal weight loss due to poor oral intake Malignancy induced cachexia cannot be excluded For now, I recommend frequent small meals as tolerated   Orders Placed This Encounter  Procedures  . NM PET Image Restag (PS) Skull Base To Thigh    Standing Status:   Future    Standing Expiration Date:   03/21/2019    Order Specific Question:   If indicated for the ordered procedure, I authorize the administration of a radiopharmaceutical per Radiology protocol    Answer:   Yes    Order Specific Question:   Preferred imaging location?    Answer:   [facility]    Order Specific Question:   Radiology Contrast Protocol - do NOT remove file path    Answer:   [email or web address]    INTERVAL HISTORY: Please see below for problem oriented charting. She returns with her daughter for further follow-up She has lost more than 10  pounds since last time I saw her due to reduced oral intake She continues to have chronic dysphagia No new lymphadenopathy in her neck She complained of mild skin itching due to recent poison ivy induced hives but she is improving She denies cough, chest pain or shortness of breath She denies severe cancer pain in her neck  SUMMARY OF ONCOLOGIC HISTORY: Oncology History   Cancer of base of tongue ([place])   Staging form: Lip and Oral Cavity, AJCC 7th Edition     Clinical stage from 10/14/2015: Stage IVA (T2, N2b, M0) - Signed by [name], MD on 10/19/2015       Cancer of base of tongue ([place])   09/29/2015 Pathology Results    Accession: [id number] FNA right neck showed necrotic cells suspicious for squamous cell cancer      09/29/2015 Pathology Results    Accession: [id number] Mouth biopsy showed pyogenic granuloma      10/08/2015 Imaging    CT neck showed 1. 3 cm right tongue base mass consistent with malignancy.2. Necrotic right level II and III nodal metastases.3. Enlarged right thyroid lobe containing multiple nodules.      10/14/2015 Imaging    PET scan showed 1. Hypermetabolic right tongue base mass with hypermetabolic right level II and III adenopathy. Small left level III lymph node appears mildly hypermetabolic as well.2. A few scattered tiny pulmonary nodules are too small for PETresolution      10/16/2015 Imaging    [place] Head/Neck:  1) Multiple thyroid lesions bilaterally;  dominant nodule in right lower pole measures 22 mm and meets fine-needle aspiration criteria. 2) Complex mass within the soft tissues of the right side of neck worrisome for metastatic malignancy.      10/26/2015 Pathology Results    Accession [id number]:  Thyroid FNA - Consistent with benign follicular nodule.      10/28/2015 Surgery    Multiple tooth extractions (26), right tongue base biopsy.      10/28/2015 Pathology Results    Accession [id number]:  Tongue, right base -  Infiltrative SCC, p16 positive.   Actinomyces infection.      11/05/2015 Procedure    Port-a-cath and PEG placed.      11/11/2015 - 01/05/2016 Radiation Therapy    Site/dose:   Base of tongue and bilateral neck / 70 Gy in 35 fractions to gross disease, 63 Gy in 35 fractions to high risk nodal echelons, and 56 Gy in 35 fractions to intermediate risk nodal echelons Beams/energy:   Helical IMRT / 6 MV photons      11/11/2015 - 11/12/2015 Chemotherapy    She received high dose cisplatin. Treatment was discontinued permanently due to severe side-effects      11/13/2015 Adverse Reaction    She presented with intractable nausea and vomiting      11/13/2015 - 11/30/2015 Hospital Admission    She was hospitalized for intractable nausea, vomiting and developed acute renal failure, reversed with aggressive IV fluid hydration. Subsequently, she was found to have intra-abdominal infection with MRSA requiring placement of drainage tube and IV ABx      11/24/2015 Imaging    CT scan showed large abdominal abscess      11/25/2015 Procedure    She had placement of drain      12/08/2015 Procedure    Intra-abdominal drain was removed      12/08/2015 Imaging    CT abdomen showed resolution of abscess and a new left lower base lung nodules      12/31/2015 Imaging    Enlarging left lower lobe pulmonary nodule. This could represent a hematogenous metastasis although the rate of enlargement is faster than typical.      02/16/2016 Imaging    Pulmonary nodule in the left lower lobe has increased in size from 12/08/2015. This is a new finding when compared with 10/13/2014. Findings are worrisome for progression of pulmonary metastasis      03/14/2016 - 03/23/2016 Radiation Therapy    Site/dose:  Left lower lung / 60 Gy in 5 fractions.  Beams/energy:   SBRT-3D / 6FFF      10/07/2016 Imaging    CT neck showed stable exam compared to 07/07/2016, including the persistently enlarged right jugulodigastric node which was characterized on PET-CT  04/05/2016. No evidence of new or progressive disease      10/07/2016 Imaging    Continued decrease in size of the left lower lobe pulmonary nodule which now measures 5 x 5 mm and is surrounded by radiation changes. 2. Stable small right upper lobe pulmonary nodules. No new pulmonary lesions. 3. Stable biapical pleural and parenchymal scarring changes. 4. No mediastinal or hilar mass or adenopathy. 5. Stable multinodular right thyroid goiter. 6. No findings for upper abdominal metastatic disease. Stable left adrenal gland adenoma.      12/29/2016 Imaging    Barium swallow shows a long segment of narrowing in the cervical esophagus which would not pass a 13 mm tablet. Also reflux       01/05/2017 Imaging    Ct  chest: Radiation change in the left lower lobe is similar. The previously described 5 mm pulmonary nodule is difficult to differentiate from surrounding radiation change. 2. Similar right-sided pulmonary nodules. No enlarging or suspicious pulmonary nodule identified. 3. Coronary artery atherosclerosis. Aortic atherosclerosis.      06/19/2017 Procedure    Successful exchange of the gastrostomy tube. The patient has a 67 [place] balloon retention catheter.      10/09/2017 Imaging    1. No evidence of lung cancer metastasis. 2. 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 Recommend continued surveillance. 3. Stable apical scarring. 4. Nodule liver suggests cirrhosis      12/21/2017 Procedure    Successful exchange of existing gastrostomy tube(Foley) for new 20 french balloon retention gastrostomy tube      03/19/2018 Imaging    1. Interval increase in nodular area of consolidation within the subpleural left lower lobe which may represent evolving post treatment/radiation changes. Localized recurrence at this level is not entirely excluded. 2. Stable biapical pleuroparenchymal scarring. 3. Aortic Atherosclerosis  (ICD10-I70.0).       REVIEW OF SYSTEMS:   Constitutional: Denies fevers, chills  Eyes: Denies blurriness of vision Ears, nose, mouth, throat, and face: Denies mucositis or sore throat Respiratory: Denies cough, dyspnea or wheezes Cardiovascular: Denies palpitation, chest discomfort or lower extremity swelling Gastrointestinal:  Denies nausea, heartburn or change in bowel habits Lymphatics: Denies new lymphadenopathy or easy bruising Neurological:Denies numbness, tingling or new weaknesses Behavioral/Psych: Mood is stable, no new changes  All other systems were reviewed with the patient and are negative.  I have reviewed the past medical history, past surgical history, social history and family history with the patient and they are unchanged from previous note.  ALLERGIES:  is allergic to chlorhexidine gluconate and morphine and related.  MEDICATIONS:  Current Outpatient Medications  Medication Sig Dispense Refill  . lidocaine-prilocaine (EMLA) cream Apply 1 application topically as needed. (Patient taking differently: Apply 1 application topically as needed (prior to port being accessed). ) 30 g 6  . Nutritional Supplements (PROMOD) LIQD Take 1.5 oz by mouth daily.     . Nutritional Supplements (TWOCAL HN) LIQD Take 237 mLs by mouth 2 (two) times daily.     . predniSONE (DELTASONE) 20 MG tablet Three a day for 3 days, 2 a day for 3 days then one a day until gone 18 tablet 0   No current facility-administered medications for this visit.     PHYSICAL EXAMINATION: ECOG PERFORMANCE STATUS: 1 - Symptomatic but completely ambulatory  Vitals:   03/20/18 0843  BP: 137/64  Pulse: 65  Resp: 18  Temp: 98.6 F (37 C)  SpO2: 100%   Filed Weights   03/20/18 0843  Weight: 126 lb 3.2 oz (57.2 kg)    GENERAL:alert, no distress and comfortable.  She looks thin and mildly cachectic SKIN: skin color, texture, turgor are normal, no rashes or significant lesions EYES: normal, Conjunctiva  are pink and non-injected, sclera clear OROPHARYNX:no exudate, no erythema and lips, buccal mucosa, and tongue normal  NECK: supple, thyroid normal size, non-tender, without nodularity LYMPH: Mild fibrosis noted on her neck from prior radiation LUNGS: clear to auscultation and percussion with normal breathing effort HEART: regular rate & rhythm and no murmurs and no lower extremity edema ABDOMEN:abdomen soft, non-tender and normal bowel sounds Musculoskeletal:no cyanosis of digits and no clubbing  NEURO: alert & oriented x 3 with fluent speech, no focal motor/sensory deficits  LABORATORY DATA:  I have reviewed the data as listed    Component Value Date/Time   NA 138 03/19/2018 0811   NA 139 04/06/2017 0744   K 3.6 03/19/2018 0811   K 4.3 04/06/2017 0744   CL 102 03/19/2018 0811   CO2 29 03/19/2018 0811   CO2 24 04/06/2017 0744   GLUCOSE 118 03/19/2018 0811   GLUCOSE 90 04/06/2017 0744   BUN 31 (H) 03/19/2018 0811   BUN 44.1 (H) 04/06/2017 0744   CREATININE 1.04 03/19/2018 0811   CREATININE 1.1 04/06/2017 0744   CALCIUM 9.5 03/19/2018 0811   CALCIUM 10.1 04/06/2017 0744   PROT 6.3 (L) 03/19/2018 0811   PROT 6.9 04/06/2017 0744   ALBUMIN 4.0 03/19/2018 0811   ALBUMIN 3.9 04/06/2017 0744   AST 48 (H) 03/19/2018 0811   AST 25 04/06/2017 0744   ALT 46 03/19/2018 0811   ALT 19 04/06/2017 0744   ALKPHOS 72 03/19/2018 0811   ALKPHOS 112 04/06/2017 0744   BILITOT 0.6 03/19/2018 0811   BILITOT 0.63 04/06/2017 0744   GFRNONAA 53 (L) 03/19/2018 0811   GFRAA >60 03/19/2018 0811    No results found for: SPEP, UPEP  Lab Results  Component Value Date   WBC 6.4 03/19/2018   NEUTROABS 4.7 03/19/2018   HGB 8.6 (L) 03/19/2018   HCT 27.6 (L) 03/19/2018   MCV 67.3 (L) 03/19/2018   PLT 190 03/19/2018      Chemistry      Component Value Date/Time   NA 138 03/19/2018 0811   NA 139 04/06/2017 0744   K 3.6 03/19/2018 0811   K 4.3 04/06/2017 0744   CL 102 03/19/2018 0811   CO2 29  03/19/2018 0811   CO2 24 04/06/2017 0744   BUN 31 (H) 03/19/2018 0811   BUN 44.1 (H) 04/06/2017 0744   CREATININE 1.04 03/19/2018 0811   CREATININE 1.1 04/06/2017 0744      Component Value Date/Time   CALCIUM 9.5 03/19/2018 0811   CALCIUM 10.1 04/06/2017 0744   ALKPHOS 72 03/19/2018 0811   ALKPHOS 112 04/06/2017 0744   AST 48 (H) 03/19/2018 0811   AST 25 04/06/2017 0744   ALT 46 03/19/2018 0811   ALT 19 04/06/2017 0744   BILITOT 0.6 03/19/2018 0811   BILITOT 0.63 04/06/2017 0744       RADIOGRAPHIC STUDIES: I have reviewed multiple imaging studies with the patient and daughter I have personally reviewed the radiological images as listed and agreed with the findings in the report. Ct Chest W Contrast  Result Date: 03/19/2018 CLINICAL DATA:  Patient with history of throat/tongue cancer diagnosed in 2016. EXAM: CT CHEST WITH CONTRAST TECHNIQUE: Multidetector CT imaging of the chest was performed during intravenous contrast administration. CONTRAST:  77mL OMNIPAQUE IOHEXOL 300 MG/ML  SOLN COMPARISON:  Chest CT 10/09/2017 FINDINGS: Cardiovascular: Right anterior chest wall Port-A-Cath is present with tip terminating in the superior vena cava. Normal heart size. Trace pericardial fluid. Mediastinum/Nodes: No enlarged axillary, mediastinal or hilar lymphadenopathy. Normal appearance of the esophagus. Lungs/Pleura: Stable biapical pleuroparenchymal thickening/consolidation. Stable 3 mm right lower lobe nodule (image 91; series 5). Stable 4 mm right lower lobe nodule (image 108; series 5). Interval increase in subpleural nodular area of consolidation within the left lower lobe measuring 2.4 x 2.2 cm (image 126; series 5), previously measuring 1.7 x 1.3 cm. No pleural effusion or pneumothorax. Upper Abdomen: No acute process. Percutaneous gastrostomy tube in place. Musculoskeletal: Thoracic spine degenerative changes. No aggressive or acute appearing osseous lesions. IMPRESSION:  1. Interval increase  in nodular area of consolidation within the subpleural left lower lobe which may represent evolving post treatment/radiation changes. Localized recurrence at this level is not entirely excluded. 2. Stable biapical pleuroparenchymal scarring. 3. Aortic Atherosclerosis (ICD10-I70.0). Electronically Signed   By: Lovey Newcomer M.D.   On: 03/19/2018 13:06    All questions were answered. The patient knows to call the clinic with any problems, questions or concerns. No barriers to learning was detected.  I spent 25 minutes counseling the patient face to face. The total time spent in the appointment was 30 minutes and more than 50% was on counseling and review of test results  [name], MD 03/20/2018 9:46 AM

## 2018-03-27 ENCOUNTER — Encounter (HOSPITAL_COMMUNITY)
Admission: RE | Admit: 2018-03-27 | Discharge: 2018-03-27 | Disposition: A | Discharge status: Home / Self Care | Payer: Medicare Other | Source: Ambulatory Visit | Attending: Hematology and Oncology | Admitting: Hematology and Oncology

## 2018-03-27 ENCOUNTER — Telehealth: Payer: Self-pay | Admitting: Hematology and Oncology

## 2018-03-27 ENCOUNTER — Other Ambulatory Visit: Payer: Self-pay | Admitting: Hematology and Oncology

## 2018-03-27 DIAGNOSIS — C01 Malignant neoplasm of base of tongue: Secondary | ICD-10-CM | POA: Diagnosis not present

## 2018-03-27 DIAGNOSIS — C7802 Secondary malignant neoplasm of left lung: Secondary | ICD-10-CM | POA: Diagnosis not present

## 2018-03-27 DIAGNOSIS — R634 Abnormal weight loss: Secondary | ICD-10-CM

## 2018-03-27 DIAGNOSIS — C3492 Malignant neoplasm of unspecified part of left bronchus or lung: Secondary | ICD-10-CM | POA: Diagnosis not present

## 2018-03-27 DIAGNOSIS — C3432 Malignant neoplasm of lower lobe, left bronchus or lung: Secondary | ICD-10-CM

## 2018-03-27 DIAGNOSIS — C44329 Squamous cell carcinoma of skin of other parts of face: Secondary | ICD-10-CM | POA: Diagnosis not present

## 2018-03-27 LAB — GLUCOSE, CAPILLARY: Glucose-Capillary: 90 mg/dL (ref 70–99)

## 2018-03-27 MED ORDER — FLUDEOXYGLUCOSE F - 18 (FDG) INJECTION
6.2800 | Freq: Once | INTRAVENOUS | Status: AC | PRN
  Administered 2018-03-27: 6.28 via INTRAVENOUS

## 2018-03-27 NOTE — Telephone Encounter (Signed)
I have reviewed PET CT scan with the patient over the telephone PET CT scan did not confirm disease progression I recommend repeat CT scan of the chest in 4 months for further evaluation and she agreed with the plan of care

## 2018-03-28 ENCOUNTER — Telehealth: Payer: Self-pay | Admitting: Hematology and Oncology

## 2018-03-28 NOTE — Telephone Encounter (Signed)
Called patient, no answer.  Mailed calendar per 7/2 sch msg.

## 2018-04-02 ENCOUNTER — Ambulatory Visit: Payer: Self-pay | Admitting: Hematology and Oncology

## 2018-05-08 ENCOUNTER — Inpatient Hospital Stay: Payer: Medicare Other | Attending: Hematology and Oncology | Admitting: Hematology and Oncology

## 2018-05-08 DIAGNOSIS — Z9221 Personal history of antineoplastic chemotherapy: Secondary | ICD-10-CM | POA: Diagnosis not present

## 2018-05-08 DIAGNOSIS — D569 Thalassemia, unspecified: Secondary | ICD-10-CM | POA: Diagnosis not present

## 2018-05-08 DIAGNOSIS — R1310 Dysphagia, unspecified: Secondary | ICD-10-CM | POA: Insufficient documentation

## 2018-05-08 DIAGNOSIS — C7800 Secondary malignant neoplasm of unspecified lung: Secondary | ICD-10-CM | POA: Insufficient documentation

## 2018-05-08 DIAGNOSIS — Z923 Personal history of irradiation: Secondary | ICD-10-CM | POA: Insufficient documentation

## 2018-05-08 DIAGNOSIS — Z452 Encounter for adjustment and management of vascular access device: Secondary | ICD-10-CM | POA: Diagnosis not present

## 2018-05-08 DIAGNOSIS — R634 Abnormal weight loss: Secondary | ICD-10-CM | POA: Diagnosis not present

## 2018-05-08 DIAGNOSIS — C01 Malignant neoplasm of base of tongue: Secondary | ICD-10-CM | POA: Diagnosis not present

## 2018-05-08 MED ORDER — HEPARIN SOD (PORK) LOCK FLUSH 100 UNIT/ML IV SOLN
500.0000 [IU] | Freq: Once | INTRAVENOUS | Status: AC | PRN
  Administered 2018-05-08: 500 [IU] via INTRAVENOUS
  Filled 2018-05-08: qty 5

## 2018-05-08 MED ORDER — SODIUM CHLORIDE 0.9 % IJ SOLN
10.0000 mL | INTRAMUSCULAR | Status: DC | PRN
  Administered 2018-05-08: 10 mL via INTRAVENOUS
  Filled 2018-05-08: qty 10

## 2018-05-15 ENCOUNTER — Encounter: Payer: Self-pay | Admitting: Hematology and Oncology

## 2018-05-16 ENCOUNTER — Encounter: Payer: Self-pay | Admitting: *Deleted

## 2018-05-23 NOTE — Progress Notes (Signed)
This encounter was created in error - please disregard.

## 2018-06-11 DIAGNOSIS — Z85828 Personal history of other malignant neoplasm of skin: Secondary | ICD-10-CM | POA: Diagnosis not present

## 2018-06-11 DIAGNOSIS — L814 Other melanin hyperpigmentation: Secondary | ICD-10-CM | POA: Diagnosis not present

## 2018-06-11 DIAGNOSIS — D225 Melanocytic nevi of trunk: Secondary | ICD-10-CM | POA: Diagnosis not present

## 2018-06-11 DIAGNOSIS — D485 Neoplasm of uncertain behavior of skin: Secondary | ICD-10-CM | POA: Diagnosis not present

## 2018-06-11 DIAGNOSIS — L308 Other specified dermatitis: Secondary | ICD-10-CM | POA: Diagnosis not present

## 2018-06-11 DIAGNOSIS — L821 Other seborrheic keratosis: Secondary | ICD-10-CM | POA: Diagnosis not present

## 2018-06-11 DIAGNOSIS — C44519 Basal cell carcinoma of skin of other part of trunk: Secondary | ICD-10-CM | POA: Diagnosis not present

## 2018-06-11 MED FILL — BETAMETHASONE DIPROPIONATE: 0.05 | 14 days supply | Qty: 50 | Fill #0

## 2018-06-19 ENCOUNTER — Inpatient Hospital Stay: Payer: Medicare Other | Attending: Hematology and Oncology

## 2018-06-19 ENCOUNTER — Telehealth: Payer: Self-pay

## 2018-06-19 ENCOUNTER — Other Ambulatory Visit: Payer: Self-pay | Admitting: Hematology and Oncology

## 2018-06-19 DIAGNOSIS — C01 Malignant neoplasm of base of tongue: Secondary | ICD-10-CM

## 2018-06-19 DIAGNOSIS — Z85828 Personal history of other malignant neoplasm of skin: Secondary | ICD-10-CM | POA: Diagnosis not present

## 2018-06-19 DIAGNOSIS — C44519 Basal cell carcinoma of skin of other part of trunk: Secondary | ICD-10-CM | POA: Diagnosis not present

## 2018-06-19 DIAGNOSIS — Z452 Encounter for adjustment and management of vascular access device: Secondary | ICD-10-CM | POA: Diagnosis not present

## 2018-06-19 MED ORDER — HEPARIN SOD (PORK) LOCK FLUSH 100 UNIT/ML IV SOLN
500.0000 [IU] | Freq: Once | INTRAVENOUS | Status: DC | PRN
  Filled 2018-06-19: qty 5

## 2018-06-19 MED ORDER — SODIUM CHLORIDE 0.9 % IJ SOLN
10.0000 mL | INTRAMUSCULAR | Status: DC | PRN
  Administered 2018-06-19: 10 mL via INTRAVENOUS
  Filled 2018-06-19: qty 10

## 2018-06-19 MED FILL — MUPIROCIN 2% OINTMENT: 2 | 10 days supply | Qty: 22 | Fill #0

## 2018-06-19 NOTE — Telephone Encounter (Signed)
Called regarding port evaluation. Tiffany will call and schedule appt.

## 2018-06-19 NOTE — Telephone Encounter (Signed)
-----   Message from [name], MD sent at 06/19/2018  9:35 AM EDT ----- Regarding: port not working I put order for IR to eval pls call

## 2018-06-22 ENCOUNTER — Encounter: Payer: Self-pay | Admitting: Hematology and Oncology

## 2018-06-28 ENCOUNTER — Other Ambulatory Visit: Payer: Self-pay | Admitting: Hematology and Oncology

## 2018-06-28 ENCOUNTER — Encounter (HOSPITAL_COMMUNITY): Payer: Self-pay | Admitting: Interventional Radiology

## 2018-06-28 ENCOUNTER — Ambulatory Visit (HOSPITAL_COMMUNITY)
Admission: RE | Admit: 2018-06-28 | Discharge: 2018-06-28 | Disposition: A | Discharge status: Home / Self Care | Payer: Medicare Other | Source: Ambulatory Visit | Attending: Hematology and Oncology | Admitting: Hematology and Oncology

## 2018-06-28 DIAGNOSIS — Y713 Surgical instruments, materials and cardiovascular devices (including sutures) associated with adverse incidents: Secondary | ICD-10-CM | POA: Insufficient documentation

## 2018-06-28 DIAGNOSIS — C01 Malignant neoplasm of base of tongue: Secondary | ICD-10-CM

## 2018-06-28 DIAGNOSIS — T82598A Other mechanical complication of other cardiac and vascular devices and implants, initial encounter: Secondary | ICD-10-CM | POA: Diagnosis not present

## 2018-06-28 DIAGNOSIS — T82868A Thrombosis of vascular prosthetic devices, implants and grafts, initial encounter: Secondary | ICD-10-CM | POA: Diagnosis not present

## 2018-06-28 HISTORY — PX: IR CV LINE INJECTION: IMG2294

## 2018-06-28 MED ORDER — HEPARIN SOD (PORK) LOCK FLUSH 100 UNIT/ML IV SOLN
INTRAVENOUS | Status: AC
  Filled 2018-06-28: qty 5

## 2018-06-28 MED ORDER — IOPAMIDOL (ISOVUE-300) INJECTION 61%
INTRAVENOUS | Status: AC
  Filled 2018-06-28: qty 50

## 2018-06-28 MED ORDER — IOPAMIDOL (ISOVUE-300) INJECTION 61%
50.0000 mL | Freq: Once | INTRAVENOUS | Status: AC | PRN
  Administered 2018-06-28: 10 mL via INTRAVENOUS

## 2018-06-29 DIAGNOSIS — Z4802 Encounter for removal of sutures: Secondary | ICD-10-CM | POA: Diagnosis not present

## 2018-06-30 ENCOUNTER — Encounter: Payer: Self-pay | Admitting: Hematology and Oncology

## 2018-07-02 ENCOUNTER — Telehealth: Payer: Self-pay

## 2018-07-02 ENCOUNTER — Other Ambulatory Visit: Payer: Self-pay | Admitting: Hematology and Oncology

## 2018-07-02 ENCOUNTER — Ambulatory Visit (HOSPITAL_COMMUNITY)
Admission: RE | Admit: 2018-07-02 | Discharge: 2018-07-02 | Disposition: A | Discharge status: Home / Self Care | Payer: Medicare Other | Source: Ambulatory Visit | Attending: Hematology and Oncology | Admitting: Hematology and Oncology

## 2018-07-02 ENCOUNTER — Encounter (HOSPITAL_COMMUNITY): Payer: Self-pay | Admitting: Interventional Radiology

## 2018-07-02 DIAGNOSIS — C01 Malignant neoplasm of base of tongue: Secondary | ICD-10-CM | POA: Insufficient documentation

## 2018-07-02 DIAGNOSIS — Z931 Gastrostomy status: Secondary | ICD-10-CM

## 2018-07-02 DIAGNOSIS — K9423 Gastrostomy malfunction: Secondary | ICD-10-CM | POA: Diagnosis not present

## 2018-07-02 HISTORY — PX: IR REPLC GASTRO/COLONIC TUBE PERCUT W/FLUORO: IMG2333

## 2018-07-02 MED ORDER — IOPAMIDOL (ISOVUE-300) INJECTION 61%
INTRAVENOUS | Status: AC
  Administered 2018-07-02: 15 mL
  Filled 2018-07-02: qty 50

## 2018-07-02 MED ORDER — IOPAMIDOL (ISOVUE-300) INJECTION 61%
50.0000 mL | Freq: Once | INTRAVENOUS | Status: AC | PRN
  Administered 2018-07-02: 15 mL

## 2018-07-02 NOTE — Procedures (Signed)
Pre procedural Dx: Dysphagia, poorly functioning feeding tube. Post procedural Dx: Same  Successful fluoroscopic guided replacement of exisitng 20 Fr gastrostomy tube.   The feeding tube is ready for immediate use.  EBL: None  Complications: None immediate.  Jay Goku [place], MD Pager #: [phone]  

## 2018-07-02 NOTE — Telephone Encounter (Signed)
Dr. Alvy Bimler this is Debra Farrell my feeding tube has a hole in the tube and need to go to interventional radiology and get replaced on Monday, 07/02/18. Please call or send text message about this matter I have clamped off tube so it doesn't leak out and come out.  Thank Debra Farrell, Debra Farrell [birth year]  [phone]   Called regarding above mychart message. Per Dr. Alvy Bimler she placed a order for IR. IR called and spoke with Tiffany she will call patient and schedule.  Above information given to patient, she verbalized understanding.

## 2018-07-12 ENCOUNTER — Other Ambulatory Visit (HOSPITAL_COMMUNITY): Payer: Self-pay | Admitting: Interventional Radiology

## 2018-07-12 DIAGNOSIS — C01 Malignant neoplasm of base of tongue: Secondary | ICD-10-CM

## 2018-07-20 ENCOUNTER — Other Ambulatory Visit: Payer: Self-pay | Admitting: Student

## 2018-07-23 ENCOUNTER — Ambulatory Visit (HOSPITAL_COMMUNITY)
Admission: RE | Admit: 2018-07-23 | Discharge: 2018-07-23 | Disposition: A | Discharge status: Home / Self Care | Payer: Medicare Other | Source: Ambulatory Visit | Attending: Interventional Radiology | Admitting: Interventional Radiology

## 2018-07-23 ENCOUNTER — Other Ambulatory Visit (HOSPITAL_COMMUNITY): Payer: Self-pay | Admitting: Interventional Radiology

## 2018-07-23 ENCOUNTER — Encounter (HOSPITAL_COMMUNITY): Payer: Self-pay

## 2018-07-23 ENCOUNTER — Ambulatory Visit (HOSPITAL_COMMUNITY)
Admission: RE | Admit: 2018-07-23 | Discharge: 2018-07-23 | Disposition: A | Discharge status: Home / Self Care | Payer: Medicare Other | Source: Ambulatory Visit | Attending: Hematology and Oncology | Admitting: Hematology and Oncology

## 2018-07-23 DIAGNOSIS — Z885 Allergy status to narcotic agent status: Secondary | ICD-10-CM | POA: Insufficient documentation

## 2018-07-23 DIAGNOSIS — Z8614 Personal history of Methicillin resistant Staphylococcus aureus infection: Secondary | ICD-10-CM | POA: Insufficient documentation

## 2018-07-23 DIAGNOSIS — E785 Hyperlipidemia, unspecified: Secondary | ICD-10-CM | POA: Diagnosis not present

## 2018-07-23 DIAGNOSIS — C01 Malignant neoplasm of base of tongue: Secondary | ICD-10-CM

## 2018-07-23 DIAGNOSIS — Z923 Personal history of irradiation: Secondary | ICD-10-CM | POA: Diagnosis not present

## 2018-07-23 DIAGNOSIS — D563 Thalassemia minor: Secondary | ICD-10-CM | POA: Insufficient documentation

## 2018-07-23 DIAGNOSIS — I10 Essential (primary) hypertension: Secondary | ICD-10-CM | POA: Diagnosis not present

## 2018-07-23 DIAGNOSIS — Y712 Prosthetic and other implants, materials and accessory cardiovascular devices associated with adverse incidents: Secondary | ICD-10-CM | POA: Insufficient documentation

## 2018-07-23 DIAGNOSIS — T82898A Other specified complication of vascular prosthetic devices, implants and grafts, initial encounter: Secondary | ICD-10-CM | POA: Diagnosis not present

## 2018-07-23 DIAGNOSIS — K219 Gastro-esophageal reflux disease without esophagitis: Secondary | ICD-10-CM | POA: Insufficient documentation

## 2018-07-23 DIAGNOSIS — T82828A Fibrosis of vascular prosthetic devices, implants and grafts, initial encounter: Secondary | ICD-10-CM | POA: Diagnosis not present

## 2018-07-23 DIAGNOSIS — Z85810 Personal history of malignant neoplasm of tongue: Secondary | ICD-10-CM | POA: Insufficient documentation

## 2018-07-23 HISTORY — PX: IR REMOVE CV FIBRIN SHEATH: IMG699

## 2018-07-23 HISTORY — PX: IR CV LINE INJECTION: IMG2294

## 2018-07-23 HISTORY — PX: IR US GUIDE VASC ACCESS RIGHT: IMG2390

## 2018-07-23 MED ORDER — HEPARIN SOD (PORK) LOCK FLUSH 10 UNIT/ML IV SOLN
10.0000 [IU] | Freq: Once | INTRAVENOUS | Status: AC
  Administered 2018-07-23: 10 [IU]
  Filled 2018-07-23: qty 1

## 2018-07-23 MED ORDER — HEPARIN SOD (PORK) LOCK FLUSH 100 UNIT/ML IV SOLN
INTRAVENOUS | Status: AC
  Filled 2018-07-23: qty 5

## 2018-07-23 MED ORDER — MIDAZOLAM HCL 2 MG/2ML IJ SOLN
INTRAMUSCULAR | Status: AC | PRN
  Administered 2018-07-23 (×3): 1 mg via INTRAVENOUS

## 2018-07-23 MED ORDER — SODIUM CHLORIDE 0.9% FLUSH
10.0000 mL | Freq: Once | INTRAVENOUS | Status: AC
  Administered 2018-07-23: 10 mL via INTRAVENOUS

## 2018-07-23 MED ORDER — IOPAMIDOL (ISOVUE-300) INJECTION 61%
50.0000 mL | Freq: Once | INTRAVENOUS | Status: AC | PRN
  Administered 2018-07-23: 10 mL via INTRAVENOUS

## 2018-07-23 MED ORDER — LIDOCAINE HCL 1 % IJ SOLN
INTRAMUSCULAR | Status: AC
  Filled 2018-07-23: qty 20

## 2018-07-23 MED ORDER — SODIUM CHLORIDE 0.9 % IV SOLN
INTRAVENOUS | Status: DC
  Administered 2018-07-23: 13:00:00 via INTRAVENOUS

## 2018-07-23 MED ORDER — FENTANYL CITRATE (PF) 100 MCG/2ML IJ SOLN
INTRAMUSCULAR | Status: AC | PRN
  Administered 2018-07-23 (×2): 50 ug via INTRAVENOUS

## 2018-07-23 MED ORDER — MIDAZOLAM HCL 2 MG/2ML IJ SOLN
INTRAMUSCULAR | Status: AC
  Filled 2018-07-23: qty 4

## 2018-07-23 MED ORDER — IOPAMIDOL (ISOVUE-300) INJECTION 61%
INTRAVENOUS | Status: AC
  Administered 2018-07-23: 10 mL via INTRAVENOUS
  Filled 2018-07-23: qty 50

## 2018-07-23 MED ORDER — LIDOCAINE HCL (PF) 1 % IJ SOLN
INTRAMUSCULAR | Status: AC | PRN
  Administered 2018-07-23: 5 mL

## 2018-07-23 MED ORDER — FENTANYL CITRATE (PF) 100 MCG/2ML IJ SOLN
INTRAMUSCULAR | Status: AC
  Filled 2018-07-23: qty 2

## 2018-07-23 MED ORDER — SODIUM CHLORIDE FLUSH 0.9 % IV SOLN
INTRAVENOUS | Status: AC
  Filled 2018-07-23: qty 10

## 2018-07-23 NOTE — Discharge Instructions (Signed)
Port Stripping You have a bandaid covering the procedure area in your right groin. Leave this in place for 24 hours, then okay to shower. No soaking in tub baths or hot tubs until directed by your doctor.   May remove bandaid after 24 hours. May cover with another bandaid if desired, but this probably will not be necessary. Watch the incision site for signs of infection, like increasing redness/swelling/tenderness or pus drainage. Call the doctor who ordered your procedure if you experience any concerns regarding the procedure site. In case of a medical emergency, such as chest pain or trouble breathing, call 911 right away.  Moderate Conscious Sedation, Adult, Care After These instructions provide you with information about caring for yourself after your procedure. Your health care provider may also give you more specific instructions. Your treatment has been planned according to current medical practices, but problems sometimes occur. Call your health care provider if you have any problems or questions after your procedure. What can I expect after the procedure? After your procedure, it is common:  To feel sleepy for several hours.  To feel clumsy and have poor balance for several hours.  To have poor judgment for several hours.  To vomit if you eat too soon.  Follow these instructions at home: For at least 24 hours after the procedure:   Do not: ? Participate in activities where you could fall or become injured. ? Drive. ? Use heavy machinery. ? Drink alcohol. ? Take sleeping pills or medicines that cause drowsiness. ? Make important decisions or sign legal documents. ? Take care of children on your own.  Rest. Eating and drinking  Follow the diet recommended by your health care provider.  If you vomit: ? Drink water, juice, or soup when you can drink without vomiting. ? Make sure you have little or no nausea before eating solid foods. General instructions  Have a  responsible adult stay with you until you are awake and alert.  Take over-the-counter and prescription medicines only as told by your health care provider.  If you smoke, do not smoke without supervision.  Keep all follow-up visits as told by your health care provider. This is important. Contact a health care provider if:  You keep feeling nauseous or you keep vomiting.  You feel light-headed.  You develop a rash.  You have a fever. Get help right away if:  You have trouble breathing. This information is not intended to replace advice given to you by your health care provider. Make sure you discuss any questions you have with your health care provider. Document Released: 07/03/2013 Document Revised: 02/15/2016 Document Reviewed: 01/02/2016 Elsevier Interactive Patient Education  [employer].

## 2018-07-23 NOTE — H&P (Signed)
Referring Physician(s): Schurz  Supervising Physician: Jacqulynn Cadet  Patient Status:  [facility]  Chief Complaint:  "I'm getting my port fixed"  Subjective: Patient familiar to IR service from prior right thyroid biopsy in 2017, right chest wall Port-A-Cath placement and gastrostomy tube placement in 2017 as well as peritoneal abscess drain in 2017.  She has also undergone multiple gastrostomy tube exchanges, latest on 07/02/2018.  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 Port-A-Cath injection on 06/28/2018 revealed fibrin sheath at port tip.  She presents today for Port-A-Cath stripping.  She currently denies fever, headache, chest pain, dyspnea, cough, abdominal/back pain, nausea, vomiting or bleeding.  Past Medical History:  Diagnosis Date  . Abdominal abscess 11/30/2015  . Anemia in chronic illness 12/16/2015  . Cancer of base of tongue ([place]) 10/14/2015  . Eczema   . GERD (gastroesophageal reflux disease)   . Hx of radiation therapy 11/11/15- 01/05/2016   Base of Tongue and bilateral neck  . Hx of radiation therapy 03/14/16- 03/23/16   Left Lower Lung  . Hyperlipidemia   . Hypertension    no meds  . Infection with methicillin-resistant Staphylococcus aureus (MRSA) 11/30/2015  . Intractable nausea and vomiting 11/13/2015  . Laceration 04/2014   around R ear, for falling out of bed & hitting nightstand  . Nausea without vomiting 12/16/2015  . Thalassemia minor   . Throat pain in adult 12/29/2015   Past Surgical History:  Procedure Laterality Date  . ABDOMINAL HYSTERECTOMY  1990   partial  . DIRECT LARYNGOSCOPY N/A 10/28/2015   Procedure: DIRECT LARYNGOSCOPY WITH BIOPSY;  Surgeon: [name], MD;  Location: [place];  Service: ENT;  Laterality: N/A;  . DIRECT LARYNGOSCOPY N/A 03/13/2017   Procedure: DIRECT LARYNGOSCOPY;  Surgeon: [name], MD;  Location: [place];  Service: ENT;  Laterality: N/A;  . DIRECT LARYNGOSCOPY N/A 01/18/2018   Procedure: DIRECT LARYNGOSCOPY;  Surgeon: [name], MD;  Location: [place];  Service: ENT;  Laterality: N/A;  . ESOPHAGOSCOPY N/A 10/28/2015   Procedure: ESOPHAGOSCOPY;  Surgeon: [name], MD;  Location: [place];  Service: ENT;  Laterality: N/A;  . ESOPHAGOSCOPY WITH DILITATION N/A 03/13/2017   Procedure: ESOPHAGOSCOPY WITH DILITATION;  Surgeon: [name], MD;  Location: [place];  Service: ENT;  Laterality: N/A;  . ESOPHAGOSCOPY WITH DILITATION N/A 01/18/2018   Procedure: ESOPHAGOSCOPY WITH DILITATION POSSIBLE BOTOX;  Surgeon: [name], MD;  Location: [place];  Service: ENT;  Laterality: N/A;  . [place], [id number]  . IR CV LINE INJECTION  06/28/2018  . IR REPLACE G-TUBE SIMPLE WO FLUORO  06/19/2017  . IR REPLACE G-TUBE SIMPLE WO FLUORO  09/05/2017  . IR REPLACE G-TUBE SIMPLE WO FLUORO  12/21/2017  . IR REPLC GASTRO/COLONIC TUBE PERCUT W/FLUORO  07/02/2018  . LARYNGOSCOPY AND BRONCHOSCOPY N/A 10/28/2015   Procedure: BRONCHOSCOPY;  Surgeon: [name], MD;  Location: [place];  Service: ENT;  Laterality: N/A;  . MULTIPLE EXTRACTIONS WITH ALVEOLOPLASTY N/A 10/28/2015   Procedure: Extraction of tooth #'s 2-12, 14,15,17,18,20-29, 31 with alveoloplasy and mandibular left torus reduction;  Surgeon: Lenn Cal, DDS;  Location: [place];  Service: Oral Surgery;  Laterality: N/A;  . port-a-cath insertion    . RECTOCELE REPAIR    . TONSILLECTOMY     as a child  . urocele     correction surgery  Allergies: Chlorhexidine gluconate and Morphine and related  Medications: Prior to Admission medications   Medication Sig Start Date End Date Taking? Authorizing Provider  lidocaine-prilocaine (EMLA) cream Apply 1 application topically as needed. Patient taking differently: Apply 1 application topically as needed (prior to port being accessed).  10/10/17   [name], MD    Nutritional Supplements (PROMOD) LIQD Take 1.5 oz by mouth daily.     [provider]  Nutritional Supplements (TWOCAL HN) LIQD Take 237 mLs by mouth 2 (two) times daily.     [provider]  predniSONE (DELTASONE) 20 MG tablet Three a day for 3 days, 2 a day for 3 days then one a day until gone 03/16/18   Raylene Everts, MD     Vital Signs: BP (!) 158/68 (BP Location: Right Arm)   Pulse 69   Temp 98.2 F (36.8 C) (Oral)   Resp 18   SpO2 100%   Physical Exam awake, alert.  Chest clear to auscultation bilaterally.  Clean, intact right chest wall Port-A-Cath.  Heart with regular rate and rhythm.  Abdomen soft, positive bowel sounds; clean, intact gastrostomy tube.  Site nontender.  No lower extremity edema.  Imaging: No results found.  Labs:  CBC: Recent Labs    10/09/17 1303 01/12/18 0911 03/19/18 0811  WBC 4.1 3.7* 6.4  HGB 9.4* 9.5* 8.6*  HCT 29.4* 30.6* 27.6*  PLT 202 207 190    COAGS: No results for input(s): INR, APTT in the last 8760 hours.  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 She has had difficulty with aspiration of blood from Port-A-Cath and recent Port-A-Cath injection which revealed fibrin sheath at the distal aspect of the port.  She presents today for Port-A-Cath stripping.  Details/risks of procedure, including but not limited to, internal bleeding, infection, injury to adjacent structures, need for Port-A-Cath revision  discussed with patient with her understanding and consent.   LABS PENDING  Electronically Signed:   D. Rowe Robert, PA-C 07/23/2018, 12:48 PM   I spent a total of 20 minutes at the the patient's bedside AND on the patient's hospital floor or unit, greater than 50% of which was counseling/coordinating care for Port-A-Cath stripping

## 2018-07-23 NOTE — Procedures (Signed)
Interventional Radiology Procedure Note  Procedure:  1.) Catheter stripping 2.) Port injection  Complications: None  Estimated Blood Loss: None  Recommendations: - Bedrest with leg straight x 2 hrs - DC home  Signed,  Criselda Peaches, MD

## 2018-07-26 ENCOUNTER — Telehealth: Payer: Self-pay | Admitting: Hematology and Oncology

## 2018-07-26 NOTE — Telephone Encounter (Signed)
NG out 10/30 thru 11/29 - moved 11/5 f/u to 11/6 with Dr. Maylon Peppers. Spoke with patient.

## 2018-07-30 ENCOUNTER — Inpatient Hospital Stay: Payer: Medicare Other | Attending: Hematology and Oncology

## 2018-07-30 ENCOUNTER — Ambulatory Visit (HOSPITAL_COMMUNITY)
Admission: RE | Admit: 2018-07-30 | Discharge: 2018-07-30 | Disposition: A | Discharge status: Home / Self Care | Payer: Medicare Other | Source: Ambulatory Visit | Attending: Hematology and Oncology | Admitting: Hematology and Oncology

## 2018-07-30 ENCOUNTER — Inpatient Hospital Stay: Payer: Medicare Other

## 2018-07-30 DIAGNOSIS — J701 Chronic and other pulmonary manifestations due to radiation: Secondary | ICD-10-CM | POA: Diagnosis not present

## 2018-07-30 DIAGNOSIS — C01 Malignant neoplasm of base of tongue: Secondary | ICD-10-CM

## 2018-07-30 DIAGNOSIS — J479 Bronchiectasis, uncomplicated: Secondary | ICD-10-CM | POA: Diagnosis not present

## 2018-07-30 DIAGNOSIS — C7802 Secondary malignant neoplasm of left lung: Secondary | ICD-10-CM | POA: Insufficient documentation

## 2018-07-30 DIAGNOSIS — D509 Iron deficiency anemia, unspecified: Secondary | ICD-10-CM | POA: Diagnosis not present

## 2018-07-30 DIAGNOSIS — Z923 Personal history of irradiation: Secondary | ICD-10-CM | POA: Diagnosis not present

## 2018-07-30 DIAGNOSIS — C3432 Malignant neoplasm of lower lobe, left bronchus or lung: Secondary | ICD-10-CM

## 2018-07-30 DIAGNOSIS — E041 Nontoxic single thyroid nodule: Secondary | ICD-10-CM | POA: Insufficient documentation

## 2018-07-30 DIAGNOSIS — R634 Abnormal weight loss: Secondary | ICD-10-CM

## 2018-07-30 DIAGNOSIS — Z9221 Personal history of antineoplastic chemotherapy: Secondary | ICD-10-CM | POA: Insufficient documentation

## 2018-07-30 LAB — CBC WITH DIFFERENTIAL/PLATELET
Abs Immature Granulocytes: 0.01 10*3/uL (ref 0.00–0.07)
BASOS PCT: 0 %
Basophils Absolute: 0 10*3/uL (ref 0.0–0.1)
Eosinophils Absolute: 0.1 10*3/uL (ref 0.0–0.5)
Eosinophils Relative: 2 %
HCT: 32.2 % — ABNORMAL LOW (ref 36.0–46.0)
HEMOGLOBIN: 9.8 g/dL — AB (ref 12.0–15.0)
IMMATURE GRANULOCYTES: 0 %
LYMPHS PCT: 18 %
Lymphs Abs: 0.9 10*3/uL (ref 0.7–4.0)
MCH: 20.2 pg — ABNORMAL LOW (ref 26.0–34.0)
MCHC: 30.4 g/dL (ref 30.0–36.0)
MCV: 66.5 fL — ABNORMAL LOW (ref 80.0–100.0)
Monocytes Absolute: 0.7 10*3/uL (ref 0.1–1.0)
Monocytes Relative: 14 %
NEUTROS PCT: 66 %
Neutro Abs: 3.2 10*3/uL (ref 1.7–7.7)
PLATELETS: 207 10*3/uL (ref 150–400)
RBC: 4.84 MIL/uL (ref 3.87–5.11)
RDW: 14.9 % (ref 11.5–15.5)
WBC: 4.9 10*3/uL (ref 4.0–10.5)
nRBC: 0 % (ref 0.0–0.2)

## 2018-07-30 LAB — COMPREHENSIVE METABOLIC PANEL
ALBUMIN: 4 g/dL (ref 3.5–5.0)
ALK PHOS: 89 U/L (ref 38–126)
ALT: 17 U/L (ref 0–44)
AST: 21 U/L (ref 15–41)
Anion gap: 8 (ref 5–15)
BUN: 24 mg/dL — AB (ref 8–23)
CHLORIDE: 104 mmol/L (ref 98–111)
CO2: 26 mmol/L (ref 22–32)
Calcium: 9.4 mg/dL (ref 8.9–10.3)
Creatinine, Ser: 0.96 mg/dL (ref 0.44–1.00)
GFR calc Af Amer: >60 mL/min (ref >60)
GFR calc non Af Amer: 59 mL/min — ABNORMAL LOW (ref >60)
GLUCOSE: 83 mg/dL (ref 70–99)
Potassium: 4.3 mmol/L (ref 3.5–5.1)
SODIUM: 138 mmol/L (ref 135–145)
Total Bilirubin: 0.4 mg/dL (ref 0.3–1.2)
Total Protein: 6.5 g/dL (ref 6.5–8.1)

## 2018-07-30 LAB — TSH: TSH: 1.693 u[IU]/mL (ref 0.308–3.960)

## 2018-07-30 MED ORDER — HEPARIN SOD (PORK) LOCK FLUSH 100 UNIT/ML IV SOLN: 500.0000 [IU] | Freq: Once | INTRAVENOUS | Status: DC

## 2018-07-30 MED ORDER — SODIUM CHLORIDE 0.9 % IJ SOLN
INTRAMUSCULAR | Status: AC
  Filled 2018-07-30: qty 50

## 2018-07-30 MED ORDER — SODIUM CHLORIDE 0.9 % IJ SOLN
10.0000 mL | INTRAMUSCULAR | Status: DC | PRN
  Administered 2018-07-30: 10 mL via INTRAVENOUS
  Filled 2018-07-30: qty 10

## 2018-07-30 MED ORDER — IOHEXOL 300 MG/ML  SOLN
75.0000 mL | Freq: Once | INTRAMUSCULAR | Status: AC | PRN
  Administered 2018-07-30: 75 mL via INTRAVENOUS

## 2018-07-30 MED ORDER — HEPARIN SOD (PORK) LOCK FLUSH 100 UNIT/ML IV SOLN
INTRAVENOUS | Status: AC
  Administered 2018-07-30: 500 [IU] via INTRAVENOUS
  Filled 2018-07-30: qty 5

## 2018-07-31 ENCOUNTER — Ambulatory Visit: Payer: Self-pay | Admitting: Hematology and Oncology

## 2018-07-31 NOTE — Progress Notes (Signed)
[facility] OFFICE PROGRESS NOTE  Patient Care Team: [name], MD as PCP - General (Hematology and Oncology) [name], MD as Consulting Physician (Hematology and Oncology) [name], MD as Attending Physician (Radiation Oncology) [name], RD as Dietitian (Nutrition)  HEME/ONC OVERVIEW: 1. 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 Port and feeding tube -Fibrin sheath stripping of the port catheter in late 06/2018  -q6-8week port flush for now   ASSESSMENT & PLAN:  SCCa of the base of the tongue -I independently reviewed recent PET and CT scan images, and agree with the findings as documented -Briefly, PET scan was negative for any recurrent disease in the head and neck area; in addition, CT chest in 07/2018 showed stable subcentimeter pulmonary nodules and radiation fibrosis in the left lower lobe -Most recent ENT evaluation with fiberoptic scope normal in 01/2018; I discussed with the patient regarding the importance of ENT surveillance for local disease recurrence -Thyroid function monitoring q3-106months post-RT   Pulmonary nodules -PET in 03/2018 did not demonstrate any FDG-avid lesions in the lungs -CT chest in 07/2018 showed stable subcentimeter pulmonary nodules and radiation fibrosis left lower lobe -We will plan for CT chest every 6 to 12 months to monitor for any interval changes (tentatively in 01/2019)  Microcytic anemia -Likely multifactorial, including anemia chronic disease and underlying thalassemia -Patient denies any symptoms of abnormal bleeding and is asymptomatic -Hemoglobin 9.8 today, improving -We will continue to observe for now  Weight gain -Patient had difficulty gaining weight since completing chemoradiation 2017 -Feeding tube recently changed in October  2019 -Patient reports approximately 8 pound weight gain since last visit -I encouraged pt to continue small meals as tolerated and nutritional supplements per nutrition recommendations  Port access -q6-8week flush for now  Orders Placed This Encounter  Procedures  . CBC with Differential ([facility] Only)    Standing Status:   Standing    Number of Occurrences:   10    Standing Expiration Date:   08/02/2019  . CMP ([facility] only)    Standing Status:   Standing    Number of Occurrences:   10    Standing Expiration Date:   08/02/2019  . TSH    Standing Status:   Standing    Number of Occurrences:   10    Standing Expiration Date:   08/02/2019  . T4, free    Standing Status:   Standing    Number of Occurrences:   10    Standing Expiration Date:   08/02/2019   All questions were answered. The patient knows to call the clinic with any problems, questions or concerns. No barriers to learning was detected.  A total of more than 25 minutes were spent face-to-face with the patient during this encounter and over half of that time was spent on counseling and coordination of care as outlined above.   Return in 3 months for labs, port flush, and the clinic follow-up with Dr. Alvy Bimler.  Tish Men, MD 08/01/2018 4:05 PM  CHIEF COMPLAINT: "I am here for cancer follow-up"  INTERVAL HISTORY: Ms. Struve returns to clinic for follow-up of history of squamous cell carcinoma of the tongue.  She reports mild, chronic dysphagia, mostly with solid food, particularly with meat, but she is able to eat bread and other starch products without  difficulty.  She reports a weight gain of approximately 8 pounds since her last visit, and she is able to eat in addition to putting 1 can of tube feed through the feeding tube per day.  She denies any fever, chill, night sweats, lymphadenopathy, chest pain, dyspnea or palpitation.  SUMMARY OF ONCOLOGIC HISTORY: Oncology History   Cancer of base of tongue  ([place])   Staging form: Lip and Oral Cavity, AJCC 7th Edition     Clinical stage from 10/14/2015: Stage IVA (T2, N2b, M0) - Signed by [name], MD on 10/19/2015       Cancer of base of tongue ([place])   09/29/2015 Pathology Results    Accession: [id number] FNA right neck showed necrotic cells suspicious for squamous cell cancer    09/29/2015 Pathology Results    Accession: [id number] Mouth biopsy showed pyogenic granuloma    10/08/2015 Imaging    CT neck showed 1. 3 cm right tongue base mass consistent with malignancy.2. Necrotic right level II and III nodal metastases.3. Enlarged right thyroid lobe containing multiple nodules.    10/14/2015 Imaging    PET scan showed 1. Hypermetabolic right tongue base mass with hypermetabolic right level II and III adenopathy. Small left level III lymph node appears mildly hypermetabolic as well.2. A few scattered tiny pulmonary nodules are too small for PETresolution    10/16/2015 Imaging    [place] Head/Neck:  1) Multiple thyroid lesions bilaterally; dominant nodule in right lower pole measures 22 mm and meets fine-needle aspiration criteria. 2) Complex mass within the soft tissues of the right side of neck worrisome for metastatic malignancy.    10/26/2015 Pathology Results    Accession [id number]:  Thyroid FNA - Consistent with benign follicular nodule.    10/28/2015 Surgery    Multiple tooth extractions (26), right tongue base biopsy.    10/28/2015 Pathology Results    Accession [id number]:  Tongue, right base -  Infiltrative SCC, p16 positive.  Actinomyces infection.    11/05/2015 Procedure    Port-a-cath and PEG placed.    11/11/2015 - 01/05/2016 Radiation Therapy    Site/dose:   Base of tongue and bilateral neck / 70 Gy in 35 fractions to gross disease, 63 Gy in 35 fractions to high risk nodal echelons, and 56 Gy in 35 fractions to intermediate risk nodal echelons Beams/energy:   Helical IMRT / 6 MV photons    11/11/2015 - 11/12/2015 Chemotherapy    She received high  dose cisplatin. Treatment was discontinued permanently due to severe side-effects    11/13/2015 Adverse Reaction    She presented with intractable nausea and vomiting    11/13/2015 - 11/30/2015 Hospital Admission    She was hospitalized for intractable nausea, vomiting and developed acute renal failure, reversed with aggressive IV fluid hydration. Subsequently, she was found to have intra-abdominal infection with MRSA requiring placement of drainage tube and IV ABx    11/24/2015 Imaging    CT scan showed large abdominal abscess    11/25/2015 Procedure    She had placement of drain    12/08/2015 Procedure    Intra-abdominal drain was removed    12/08/2015 Imaging    CT abdomen showed resolution of abscess and a new left lower base lung nodules    12/31/2015 Imaging    Enlarging left lower lobe pulmonary nodule. This could represent a hematogenous metastasis although the rate of enlargement is faster than typical.    02/16/2016 Imaging    Pulmonary nodule in the left lower lobe  has increased in size from 12/08/2015. This is a new finding when compared with 10/13/2014. Findings are worrisome for progression of pulmonary metastasis    03/14/2016 - 03/23/2016 Radiation Therapy    Site/dose:  Left lower lung / 60 Gy in 5 fractions.  Beams/energy:   SBRT-3D / 6FFF    10/07/2016 Imaging    CT neck showed stable exam compared to 07/07/2016, including the persistently enlarged right jugulodigastric node which was characterized on PET-CT 04/05/2016. No evidence of new or progressive disease    10/07/2016 Imaging    Continued decrease in size of the left lower lobe pulmonary nodule which now measures 5 x 5 mm and is surrounded by radiation changes. 2. Stable small right upper lobe pulmonary nodules. No new pulmonary lesions. 3. Stable biapical pleural and parenchymal scarring changes. 4. No mediastinal or hilar mass or adenopathy. 5. Stable multinodular right thyroid goiter. 6. No findings for upper  abdominal metastatic disease. Stable left adrenal gland adenoma.    12/29/2016 Imaging    Barium swallow shows a long segment of narrowing in the cervical esophagus which would not pass a 13 mm tablet. Also reflux     01/05/2017 Imaging    Ct chest: Radiation change in the left lower lobe is similar. The previously described 5 mm pulmonary nodule is difficult to differentiate from surrounding radiation change. 2. Similar right-sided pulmonary nodules. No enlarging or suspicious pulmonary nodule identified. 3. Coronary artery atherosclerosis. Aortic atherosclerosis.    06/19/2017 Procedure    Successful exchange of the gastrostomy tube. The patient has a 32 [place] balloon retention catheter.    10/09/2017 Imaging    1. No evidence of lung cancer metastasis. 2. 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 Recommend continued surveillance. 3. Stable apical scarring. 4. Nodule liver suggests cirrhosis    12/21/2017 Procedure    Successful exchange of existing gastrostomy tube(Foley) for new 20 french balloon retention gastrostomy tube    03/19/2018 Imaging    1. Interval increase in nodular area of consolidation within the subpleural left lower lobe which may represent evolving post treatment/radiation changes. Localized recurrence at this level is not entirely excluded. 2. Stable biapical pleuroparenchymal scarring. 3. Aortic Atherosclerosis (ICD10-I70.0).    03/27/2018 PET scan    1. Triangular band of density in the left lower lobe at the site of the prior hypermetabolic pulmonary nodule is currently about 2.1. This is probably related to radiation fibrosis. Surveillance is likely warranted to exclude enlargement. 2. No recurrence in the neck. 3. Other imaging findings of potential clinical significance: Aortoiliac atherosclerotic vascular disease. Cholelithiasis. Right thyroid nodule is not hypermetabolic and is likely benign.     07/02/2018 Procedure    Successful bedside exchange of a new 20-French gastrostomy tube with contrast injection confirming appropriate position functionality. The new gastrostomy tube is ready for immediate use.     REVIEW OF SYSTEMS:   Constitutional: ( - ) fevers, ( - )  chills , ( - ) night sweats Eyes: ( - ) blurriness of vision, ( - ) double vision, ( - ) watery eyes Ears, nose, mouth, throat, and face: ( - ) mucositis, ( - ) sore throat Respiratory: ( - ) cough, ( - ) dyspnea, ( - ) wheezes Cardiovascular: ( - ) palpitation, ( - ) chest discomfort, ( - ) lower extremity swelling Gastrointestinal:  ( - ) nausea, ( - ) heartburn, ( - ) change in bowel habits Skin: ( - )  abnormal skin rashes Lymphatics: ( - ) new lymphadenopathy, ( - ) easy bruising Neurological: ( - ) numbness, ( - ) tingling, ( - ) new weaknesses Behavioral/Psych: ( - ) mood change, ( - ) new changes  All other systems were reviewed with the patient and are negative.  I have reviewed the past medical history, past surgical history, social history and family history with the patient and they are unchanged from previous note.  ALLERGIES:  is allergic to chlorhexidine gluconate and morphine and related.  MEDICATIONS:  Current Outpatient Medications  Medication Sig Dispense Refill  . lidocaine-prilocaine (EMLA) cream Apply 1 application topically as needed. (Patient taking differently: Apply 1 application topically as needed (prior to port being accessed). ) 30 g 6  . Nutritional Supplements (PROMOD) LIQD Take 1.5 oz by mouth daily.     . Nutritional Supplements (TWOCAL HN) LIQD Take 237 mLs by mouth 2 (two) times daily.      No current facility-administered medications for this visit.     PHYSICAL EXAMINATION: ECOG PERFORMANCE STATUS: 1 - Symptomatic but completely ambulatory  Today's Vitals   08/01/18 1335  PainSc: 0-No pain   There is no height or weight on file to calculate BMI.  There were no vitals  filed for this visit.  GENERAL: alert, no distress and comfortable SKIN: skin color, texture, turgor are normal, no rashes or significant lesions EYES: conjunctiva are pink and non-injected, sclera clear OROPHARYNX: no exudate, no erythema; lips, buccal mucosa, and tongue normal  NECK: supple, non-tender LYMPH:  no palpable lymphadenopathy in the cervical or axillary  LUNGS: clear to auscultation and percussion with normal breathing effort HEART: regular rate & rhythm and no murmurs and no lower extremity edema ABDOMEN: soft, non-tender, non-distended, normal bowel sounds Musculoskeletal: no cyanosis of digits and no clubbing  PSYCH: alert & oriented x 3, fluent speech NEURO: no focal motor/sensory deficits  LABORATORY DATA:  I have reviewed the data as listed    Component Value Date/Time   NA 138 07/30/2018 0915   NA 139 04/06/2017 0744   K 4.3 07/30/2018 0915   K 4.3 04/06/2017 0744   CL 104 07/30/2018 0915   CO2 26 07/30/2018 0915   CO2 24 04/06/2017 0744   GLUCOSE 83 07/30/2018 0915   GLUCOSE 90 04/06/2017 0744   BUN 24 (H) 07/30/2018 0915   BUN 44.1 (H) 04/06/2017 0744   CREATININE 0.96 07/30/2018 0915   CREATININE 1.1 04/06/2017 0744   CALCIUM 9.4 07/30/2018 0915   CALCIUM 10.1 04/06/2017 0744   PROT 6.5 07/30/2018 0915   PROT 6.9 04/06/2017 0744   ALBUMIN 4.0 07/30/2018 0915   ALBUMIN 3.9 04/06/2017 0744   AST 21 07/30/2018 0915   AST 25 04/06/2017 0744   ALT 17 07/30/2018 0915   ALT 19 04/06/2017 0744   ALKPHOS 89 07/30/2018 0915   ALKPHOS 112 04/06/2017 0744   BILITOT 0.4 07/30/2018 0915   BILITOT 0.63 04/06/2017 0744   GFRNONAA 59 (L) 07/30/2018 0915   GFRAA >60 07/30/2018 0915    No results found for: SPEP, UPEP  Lab Results  Component Value Date   WBC 4.9 07/30/2018   NEUTROABS 3.2 07/30/2018   HGB 9.8 (L) 07/30/2018   HCT 32.2 (L) 07/30/2018   MCV 66.5 (L) 07/30/2018   PLT 207 07/30/2018      Chemistry      Component Value Date/Time   NA  138 07/30/2018 0915   NA 139 04/06/2017 0744   K 4.3  07/30/2018 0915   K 4.3 04/06/2017 0744   CL 104 07/30/2018 0915   CO2 26 07/30/2018 0915   CO2 24 04/06/2017 0744   BUN 24 (H) 07/30/2018 0915   BUN 44.1 (H) 04/06/2017 0744   CREATININE 0.96 07/30/2018 0915   CREATININE 1.1 04/06/2017 0744      Component Value Date/Time   CALCIUM 9.4 07/30/2018 0915   CALCIUM 10.1 04/06/2017 0744   ALKPHOS 89 07/30/2018 0915   ALKPHOS 112 04/06/2017 0744   AST 21 07/30/2018 0915   AST 25 04/06/2017 0744   ALT 17 07/30/2018 0915   ALT 19 04/06/2017 0744   BILITOT 0.4 07/30/2018 0915   BILITOT 0.63 04/06/2017 0744       RADIOGRAPHIC STUDIES: I have personally reviewed the radiological images as listed below and agreed with the findings in the report. Ct Chest W Contrast  Result Date: 07/30/2018 CLINICAL DATA:  Metastatic tongue cancer. Restaging. EXAM: CT CHEST WITH CONTRAST TECHNIQUE: Multidetector CT imaging of the chest was performed during intravenous contrast administration. CONTRAST:  19mL OMNIPAQUE IOHEXOL 300 MG/ML  SOLN COMPARISON:  03/19/2018 chest CT. 03/27/2018 PET-CT. FINDINGS: Cardiovascular: Normal heart size. Stable trace pericardial effusion/thickening. Right internal jugular MediPort terminates in the lower third of the SVC. Atherosclerotic nonaneurysmal thoracic aorta. Normal caliber pulmonary arteries. No central pulmonary emboli. Mediastinum/Nodes: Stable heterogeneous 3.0 cm posterior right thyroid lobe nodule. Unremarkable esophagus. No pathologically enlarged axillary, mediastinal or hilar lymph nodes. Lungs/Pleura: No pneumothorax. No pleural effusion. Stable sharply marginated consolidation with associated bronchiectasis, volume loss and distortion at both lung apices, compatible with radiation fibrosis. Bandlike consolidation with associated volume loss and distortion in posterior basilar left lower lobe measuring up to 1.5 cm thickness (series 7/image 125), stable,  compatible with radiation fibrosis. No acute consolidative airspace disease or lung masses. A few scattered small solid pulmonary nodules in the right lung, largest 4 mm in the anterior right lower lobe (series 7/image 99), all stable on multiple prior studies, considered benign. No new significant pulmonary nodules. Mild cylindrical bronchiectasis with associated patchy mucoid impaction in the right middle lobe and lingula, slightly worsened. Upper abdomen: Subcentimeter hypodense splenic lesion is stable and too small to characterize. Well-positioned percutaneous gastrostomy tube in the anterior proximal body of the stomach. Musculoskeletal: No aggressive appearing focal osseous lesions. Mild thoracic spondylosis. IMPRESSION: 1. No findings suspicious for recurrent metastatic disease in the chest. 2. 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 3. Mild cylindrical bronchiectasis with associated patchy mucoid impaction in the right middle lobe and lingula, slightly worsened. 4. Stable heterogeneous 3.0 cm posterior right thyroid lobe nodule. Aortic Atherosclerosis (ICD10-I70.0). Electronically Signed   By: Ilona Sorrel M.D.   On: 07/30/2018 13:27   Ir Remove Cv Fibrin Sheath  Result Date: 07/23/2018 INDICATION: 72 year old female with a right IJ approach port catheter which has developed a fibrin sheath prevents aspiration. She presents for catheter stripping. EXAM: CV CATH FIBRIN SHEATH STRIPPING; CENTRAL VENOUS CATHETER; IR ULTRASOUND GUIDANCE VASC ACCESS RIGHT 1. Ultrasound-guided access right common femoral vein 2. Catheterization of the inferior vena cava 3. Snare removal of fibrin sheath from indwelling port catheter 4. Port catheter injection MEDICATIONS: 10 mL Isovue-300 ANESTHESIA/SEDATION: Moderate (conscious) sedation was employed during this procedure. A total of Versed 3 mg and Fentanyl 100 mcg was administered intravenously. Moderate Sedation Time: 17  minutes. The patient's level of consciousness and vital signs were monitored continuously by radiology nursing throughout the procedure under my direct  supervision. FLUOROSCOPY TIME:  Fluoroscopy Time: 2 minutes 12 seconds (24 mGy). COMPLICATIONS: None immediate. PROCEDURE: Informed written consent was obtained from the patient after a thorough discussion of the procedural risks, benefits and alternatives. All questions were addressed. Maximal Sterile Barrier Technique was utilized including caps, mask, sterile gowns, sterile gloves, sterile drape, hand hygiene and skin antiseptic. A timeout was performed prior to the initiation of the procedure. The right common femoral vein was interrogated with ultrasound and found to be widely patent. An image was obtained and stored for the medical record. Local anesthesia was attained by infiltration with 1% lidocaine. A small dermatotomy was made. Under real-time sonographic guidance, the vessel was punctured with a 21 gauge micropuncture needle. Using standard technique, the initial micro needle was exchanged over a 0.018 micro wire for a transitional 4 [place] micro sheath. The micro sheath was then exchanged over a 0.035 wire for a 7 [place] vascular sheath. The vascular sheath was introduced over a Bentson wire into the inferior vena cava and then into the right atrium. A 6 French 25 mm gooseneck snare was then advanced through the sheath and used to snare the intravascular portion of the port catheter. The catheter was then she stripped. This technique was performed 3 times consecutively. After the third time, the accessed port catheter was gently aspirated with excellent return of blood flow. The catheter was flushed. A port injection under digital subtraction fluoroscopy was then performed. This demonstrates excellent flow through the port catheter with resolution of the previously identified fibrin sheath. The catheter tip is located in the distal SVC. IMPRESSION: 1.  Successful snare removal of catheter associated fibrin sheath. 2. Portacatheter injection confirms removal of the fibrin sheath and normal positioning of the port catheter tip in the distal SVC. Signed, Criselda Peaches, MD, [facility] Electronically Signed   By: Jacqulynn Cadet M.D.   On: 07/23/2018 14:36   Ir Cv Line Injection  Result Date: 07/23/2018 INDICATION: 72 year old female with a right IJ approach port catheter which has developed a fibrin sheath prevents aspiration. She presents for catheter stripping. EXAM: CV CATH FIBRIN SHEATH STRIPPING; CENTRAL VENOUS CATHETER; IR ULTRASOUND GUIDANCE VASC ACCESS RIGHT 1. Ultrasound-guided access right common femoral vein 2. Catheterization of the inferior vena cava 3. Snare removal of fibrin sheath from indwelling port catheter 4. Port catheter injection MEDICATIONS: 10 mL Isovue-300 ANESTHESIA/SEDATION: Moderate (conscious) sedation was employed during this procedure. A total of Versed 3 mg and Fentanyl 100 mcg was administered intravenously. Moderate Sedation Time: 17 minutes. The patient's level of consciousness and vital signs were monitored continuously by radiology nursing throughout the procedure under my direct supervision. FLUOROSCOPY TIME:  Fluoroscopy Time: 2 minutes 12 seconds (24 mGy). COMPLICATIONS: None immediate. PROCEDURE: Informed written consent was obtained from the patient after a thorough discussion of the procedural risks, benefits and alternatives. All questions were addressed. Maximal Sterile Barrier Technique was utilized including caps, mask, sterile gowns, sterile gloves, sterile drape, hand hygiene and skin antiseptic. A timeout was performed prior to the initiation of the procedure. The right common femoral vein was interrogated with ultrasound and found to be widely patent. An image was obtained and stored for the medical record. Local anesthesia was attained by  infiltration with 1% lidocaine. A small dermatotomy was made. Under real-time sonographic guidance, the vessel was punctured with a 21 gauge micropuncture needle. Using standard technique, the initial micro needle was exchanged over a 0.018 micro wire for a transitional  4 French micro sheath. The micro sheath was then exchanged over a 0.035 wire for a 7 [place] vascular sheath. The vascular sheath was introduced over a Bentson wire into the inferior vena cava and then into the right atrium. A 6 French 25 mm gooseneck snare was then advanced through the sheath and used to snare the intravascular portion of the port catheter. The catheter was then she stripped. This technique was performed 3 times consecutively. After the third time, the accessed port catheter was gently aspirated with excellent return of blood flow. The catheter was flushed. A port injection under digital subtraction fluoroscopy was then performed. This demonstrates excellent flow through the port catheter with resolution of the previously identified fibrin sheath. The catheter tip is located in the distal SVC. IMPRESSION: 1. Successful snare removal of catheter associated fibrin sheath. 2. Portacatheter injection confirms removal of the fibrin sheath and normal positioning of the port catheter tip in the distal SVC. Signed, Criselda Peaches, MD, [place] [facility] Electronically Signed   By: Jacqulynn Cadet M.D.   On: 07/23/2018 14:36   Ir US Guide Vasc Access Right  Result Date: 07/23/2018 INDICATION: 72 year old female with a right IJ approach port catheter which has developed a fibrin sheath prevents aspiration. She presents for catheter stripping. EXAM: CV CATH FIBRIN SHEATH STRIPPING; CENTRAL VENOUS CATHETER; IR ULTRASOUND GUIDANCE VASC ACCESS RIGHT 1. Ultrasound-guided access right common femoral vein 2. Catheterization of the inferior vena cava 3. Snare removal of fibrin sheath  from indwelling port catheter 4. Port catheter injection MEDICATIONS: 10 mL Isovue-300 ANESTHESIA/SEDATION: Moderate (conscious) sedation was employed during this procedure. A total of Versed 3 mg and Fentanyl 100 mcg was administered intravenously. Moderate Sedation Time: 17 minutes. The patient's level of consciousness and vital signs were monitored continuously by radiology nursing throughout the procedure under my direct supervision. FLUOROSCOPY TIME:  Fluoroscopy Time: 2 minutes 12 seconds (24 mGy). COMPLICATIONS: None immediate. PROCEDURE: Informed written consent was obtained from the patient after a thorough discussion of the procedural risks, benefits and alternatives. All questions were addressed. Maximal Sterile Barrier Technique was utilized including caps, mask, sterile gowns, sterile gloves, sterile drape, hand hygiene and skin antiseptic. A timeout was performed prior to the initiation of the procedure. The right common femoral vein was interrogated with ultrasound and found to be widely patent. An image was obtained and stored for the medical record. Local anesthesia was attained by infiltration with 1% lidocaine. A small dermatotomy was made. Under real-time sonographic guidance, the vessel was punctured with a 21 gauge micropuncture needle. Using standard technique, the initial micro needle was exchanged over a 0.018 micro wire for a transitional 4 [place] micro sheath. The micro sheath was then exchanged over a 0.035 wire for a 7 [place] vascular sheath. The vascular sheath was introduced over a Bentson wire into the inferior vena cava and then into the right atrium. A 6 French 25 mm gooseneck snare was then advanced through the sheath and used to snare the intravascular portion of the port catheter. The catheter was then she stripped. This technique was performed 3 times consecutively. After the third time, the accessed port catheter was gently aspirated with excellent return of blood flow. The  catheter was flushed. A port injection under digital subtraction fluoroscopy was then performed. This demonstrates excellent flow through the port catheter with resolution of the previously identified fibrin sheath. The catheter tip is located in the distal SVC. IMPRESSION: 1. Successful snare removal  of catheter associated fibrin sheath. 2. Portacatheter injection confirms removal of the fibrin sheath and normal positioning of the port catheter tip in the distal SVC. Signed, Criselda Peaches, MD, [facility] Radiology Electronically Signed   By: Jacqulynn Cadet M.D.   On: 07/23/2018 14:36

## 2018-08-01 ENCOUNTER — Telehealth: Payer: Self-pay

## 2018-08-01 ENCOUNTER — Inpatient Hospital Stay (HOSPITAL_BASED_OUTPATIENT_CLINIC_OR_DEPARTMENT_OTHER): Payer: Medicare Other | Admitting: Hematology

## 2018-08-01 ENCOUNTER — Encounter: Payer: Self-pay | Admitting: Hematology

## 2018-08-01 VITALS — BP 150/70 | HR 76 | Temp 99.0°F | Resp 17

## 2018-08-01 DIAGNOSIS — Z9221 Personal history of antineoplastic chemotherapy: Secondary | ICD-10-CM | POA: Diagnosis not present

## 2018-08-01 DIAGNOSIS — D509 Iron deficiency anemia, unspecified: Secondary | ICD-10-CM | POA: Diagnosis not present

## 2018-08-01 DIAGNOSIS — D568 Other thalassemias: Secondary | ICD-10-CM

## 2018-08-01 DIAGNOSIS — C01 Malignant neoplasm of base of tongue: Secondary | ICD-10-CM | POA: Diagnosis not present

## 2018-08-01 DIAGNOSIS — C3432 Malignant neoplasm of lower lobe, left bronchus or lung: Secondary | ICD-10-CM

## 2018-08-01 DIAGNOSIS — E43 Unspecified severe protein-calorie malnutrition: Secondary | ICD-10-CM

## 2018-08-01 DIAGNOSIS — Z923 Personal history of irradiation: Secondary | ICD-10-CM

## 2018-08-01 DIAGNOSIS — E039 Hypothyroidism, unspecified: Secondary | ICD-10-CM

## 2018-08-01 NOTE — Telephone Encounter (Signed)
Printed avs and calender of upcoming appointment. Per 11/6 los

## 2018-08-03 ENCOUNTER — Encounter: Payer: Self-pay | Admitting: Hematology and Oncology

## 2018-08-07 DIAGNOSIS — Z23 Encounter for immunization: Secondary | ICD-10-CM | POA: Diagnosis not present

## 2018-09-10 ENCOUNTER — Inpatient Hospital Stay: Payer: Medicare Other | Attending: Hematology and Oncology

## 2018-09-10 DIAGNOSIS — C01 Malignant neoplasm of base of tongue: Secondary | ICD-10-CM | POA: Insufficient documentation

## 2018-09-10 DIAGNOSIS — Z452 Encounter for adjustment and management of vascular access device: Secondary | ICD-10-CM | POA: Insufficient documentation

## 2018-09-10 DIAGNOSIS — Z95828 Presence of other vascular implants and grafts: Secondary | ICD-10-CM

## 2018-09-10 MED ORDER — SODIUM CHLORIDE 0.9% FLUSH
10.0000 mL | INTRAVENOUS | Status: DC | PRN
  Administered 2018-09-10: 10 mL
  Filled 2018-09-10: qty 10

## 2018-09-10 MED ORDER — HEPARIN SOD (PORK) LOCK FLUSH 100 UNIT/ML IV SOLN
500.0000 [IU] | Freq: Once | INTRAVENOUS | Status: AC | PRN
  Administered 2018-09-10: 500 [IU]
  Filled 2018-09-10: qty 5

## 2018-10-22 ENCOUNTER — Telehealth: Payer: Self-pay | Admitting: Hematology and Oncology

## 2018-10-22 ENCOUNTER — Inpatient Hospital Stay: Payer: Medicare Other | Attending: Hematology and Oncology

## 2018-10-22 DIAGNOSIS — Z452 Encounter for adjustment and management of vascular access device: Secondary | ICD-10-CM | POA: Insufficient documentation

## 2018-10-22 DIAGNOSIS — Z95828 Presence of other vascular implants and grafts: Secondary | ICD-10-CM

## 2018-10-22 DIAGNOSIS — C01 Malignant neoplasm of base of tongue: Secondary | ICD-10-CM | POA: Diagnosis present

## 2018-10-22 MED ORDER — HEPARIN SOD (PORK) LOCK FLUSH 100 UNIT/ML IV SOLN
500.0000 [IU] | Freq: Once | INTRAVENOUS | Status: AC | PRN
  Administered 2018-10-22: 500 [IU]
  Filled 2018-10-22: qty 5

## 2018-10-22 MED ORDER — SODIUM CHLORIDE 0.9% FLUSH
10.0000 mL | INTRAVENOUS | Status: DC | PRN
  Administered 2018-10-22: 10 mL
  Filled 2018-10-22: qty 10

## 2018-10-22 NOTE — Patient Instructions (Signed)

## 2018-10-22 NOTE — Telephone Encounter (Signed)
Patient came about her port flush appt. She had one 01/27 and had another one scheduled for 02/10.  Called the MD, and she stated to just cancel all the patient appts and to schedule a 6 week appt from today (01/27). And when she sees the patient she will have the patient appts fixed after that.  Printed calendar for patient.

## 2018-11-05 ENCOUNTER — Other Ambulatory Visit: Payer: Medicare Other

## 2018-11-05 ENCOUNTER — Ambulatory Visit: Payer: Medicare Other | Admitting: Hematology and Oncology

## 2018-11-29 ENCOUNTER — Other Ambulatory Visit: Payer: Self-pay | Admitting: Hematology and Oncology

## 2018-11-29 DIAGNOSIS — R5383 Other fatigue: Secondary | ICD-10-CM

## 2018-11-29 DIAGNOSIS — C01 Malignant neoplasm of base of tongue: Secondary | ICD-10-CM

## 2018-12-03 ENCOUNTER — Other Ambulatory Visit: Payer: Self-pay

## 2018-12-03 ENCOUNTER — Ambulatory Visit: Payer: Self-pay | Admitting: Hematology and Oncology

## 2018-12-03 ENCOUNTER — Inpatient Hospital Stay: Payer: Medicare Other

## 2018-12-03 ENCOUNTER — Inpatient Hospital Stay: Payer: Medicare Other | Attending: Hematology and Oncology

## 2018-12-03 DIAGNOSIS — C01 Malignant neoplasm of base of tongue: Secondary | ICD-10-CM | POA: Diagnosis present

## 2018-12-03 DIAGNOSIS — Z95828 Presence of other vascular implants and grafts: Secondary | ICD-10-CM

## 2018-12-03 DIAGNOSIS — Z9221 Personal history of antineoplastic chemotherapy: Secondary | ICD-10-CM | POA: Diagnosis not present

## 2018-12-03 DIAGNOSIS — C7802 Secondary malignant neoplasm of left lung: Secondary | ICD-10-CM | POA: Insufficient documentation

## 2018-12-03 DIAGNOSIS — Z452 Encounter for adjustment and management of vascular access device: Secondary | ICD-10-CM | POA: Diagnosis present

## 2018-12-03 DIAGNOSIS — Z923 Personal history of irradiation: Secondary | ICD-10-CM | POA: Insufficient documentation

## 2018-12-03 DIAGNOSIS — R5383 Other fatigue: Secondary | ICD-10-CM

## 2018-12-03 DIAGNOSIS — R1310 Dysphagia, unspecified: Secondary | ICD-10-CM | POA: Insufficient documentation

## 2018-12-03 LAB — CBC WITH DIFFERENTIAL/PLATELET
Abs Immature Granulocytes: 0.03 10*3/uL (ref 0.00–0.07)
BASOS ABS: 0 10*3/uL (ref 0.0–0.1)
Basophils Relative: 1 %
EOS PCT: 1 %
Eosinophils Absolute: 0.1 10*3/uL (ref 0.0–0.5)
HCT: 33.5 % — ABNORMAL LOW (ref 36.0–46.0)
Hemoglobin: 10.2 g/dL — ABNORMAL LOW (ref 12.0–15.0)
Immature Granulocytes: 1 %
Lymphocytes Relative: 14 %
Lymphs Abs: 0.7 10*3/uL (ref 0.7–4.0)
MCH: 20.2 pg — ABNORMAL LOW (ref 26.0–34.0)
MCHC: 30.4 g/dL (ref 30.0–36.0)
MCV: 66.2 fL — ABNORMAL LOW (ref 80.0–100.0)
Monocytes Absolute: 0.6 10*3/uL (ref 0.1–1.0)
Monocytes Relative: 12 %
Neutro Abs: 3.4 10*3/uL (ref 1.7–7.7)
Neutrophils Relative %: 71 %
Platelets: 218 10*3/uL (ref 150–400)
RBC: 5.06 MIL/uL (ref 3.87–5.11)
RDW: 15 % (ref 11.5–15.5)
WBC: 4.8 10*3/uL (ref 4.0–10.5)
nRBC: 0 % (ref 0.0–0.2)

## 2018-12-03 LAB — COMPREHENSIVE METABOLIC PANEL
ALBUMIN: 4.6 g/dL (ref 3.5–5.0)
ALT: 19 U/L (ref 0–44)
AST: 24 U/L (ref 15–41)
Alkaline Phosphatase: 78 U/L (ref 38–126)
Anion gap: 8 (ref 5–15)
BUN: 32 mg/dL — ABNORMAL HIGH (ref 8–23)
CO2: 29 mmol/L (ref 22–32)
Calcium: 9.4 mg/dL (ref 8.9–10.3)
Chloride: 102 mmol/L (ref 98–111)
Creatinine, Ser: 0.92 mg/dL (ref 0.44–1.00)
GFR calc Af Amer: >60 mL/min (ref >60)
GFR calc non Af Amer: >60 mL/min (ref >60)
Glucose, Bld: 125 mg/dL — ABNORMAL HIGH (ref 70–99)
Potassium: 4.1 mmol/L (ref 3.5–5.1)
Sodium: 139 mmol/L (ref 135–145)
Total Bilirubin: 0.6 mg/dL (ref 0.3–1.2)
Total Protein: 7 g/dL (ref 6.5–8.1)

## 2018-12-03 LAB — TSH: TSH: 2.825 u[IU]/mL (ref 0.308–3.960)

## 2018-12-03 LAB — T4, FREE: Free T4: 0.92 ng/dL (ref 0.82–1.77)

## 2018-12-03 MED ORDER — HEPARIN SOD (PORK) LOCK FLUSH 100 UNIT/ML IV SOLN
500.0000 [IU] | Freq: Once | INTRAVENOUS | Status: AC | PRN
  Administered 2018-12-03: 500 [IU]
  Filled 2018-12-03: qty 5

## 2018-12-03 MED ORDER — SODIUM CHLORIDE 0.9% FLUSH
10.0000 mL | INTRAVENOUS | Status: DC | PRN
  Administered 2018-12-03: 10 mL
  Filled 2018-12-03: qty 10

## 2018-12-04 ENCOUNTER — Telehealth: Payer: Self-pay | Admitting: Hematology and Oncology

## 2018-12-04 ENCOUNTER — Encounter: Payer: Self-pay | Admitting: Hematology and Oncology

## 2018-12-04 ENCOUNTER — Inpatient Hospital Stay: Payer: Medicare Other | Admitting: Hematology and Oncology

## 2018-12-04 DIAGNOSIS — C01 Malignant neoplasm of base of tongue: Secondary | ICD-10-CM | POA: Diagnosis not present

## 2018-12-04 DIAGNOSIS — Z9221 Personal history of antineoplastic chemotherapy: Secondary | ICD-10-CM

## 2018-12-04 DIAGNOSIS — C7802 Secondary malignant neoplasm of left lung: Secondary | ICD-10-CM

## 2018-12-04 DIAGNOSIS — R1314 Dysphagia, pharyngoesophageal phase: Secondary | ICD-10-CM

## 2018-12-04 DIAGNOSIS — Z923 Personal history of irradiation: Secondary | ICD-10-CM

## 2018-12-04 DIAGNOSIS — R1310 Dysphagia, unspecified: Secondary | ICD-10-CM | POA: Diagnosis not present

## 2018-12-04 NOTE — Assessment & Plan Note (Signed)
Her last CT imaging is stable I plan to repeat CT chest again in 3 months

## 2018-12-04 NOTE — Telephone Encounter (Signed)
Gave avs and calendar ° °

## 2018-12-04 NOTE — Assessment & Plan Note (Signed)
The patient has severe dysphagia but overall, she is able to maintain her weight She will continue liquid diet as tolerated.

## 2018-12-04 NOTE — Assessment & Plan Note (Signed)
Clinically, she has no signs or symptoms to suggest cancer recurrence I recommend close ENT follow-up I will see her back again in 3 months for further follow-up

## 2018-12-04 NOTE — Progress Notes (Signed)
[place] OFFICE PROGRESS NOTE  Patient Care Team: [name], MD as PCP - General (Hematology and Oncology) [name], MD as Consulting Physician (Hematology and Oncology) [name], MD as Attending Physician (Radiation Oncology) [name], RD as Dietitian (Nutrition)  ASSESSMENT & PLAN:  Cancer of base of tongue ([place]) Clinically, she has no signs or symptoms to suggest cancer recurrence I recommend close ENT follow-up I will see her back again in 3 months for further follow-up  Metastasis to lung [facility]) Her last CT imaging is stable I plan to repeat CT chest again in 3 months  Dysphagia The patient has severe dysphagia but overall, she is able to maintain her weight She will continue liquid diet as tolerated.   Orders Placed This Encounter  Procedures  . CT CHEST W CONTRAST    Standing Status:   Future    Standing Expiration Date:   12/04/2019    Order Specific Question:   If indicated for the ordered procedure, I authorize the administration of contrast media per Radiology protocol    Answer:   Yes    Order Specific Question:   Preferred imaging location?    Answer:   [facility]    Order Specific Question:   Radiology Contrast Protocol - do NOT remove file path    Answer:   [email or web address]  . CBC with Differential/Platelet    Standing Status:   Future    Standing Expiration Date:   01/08/2020  . Comprehensive metabolic panel    Standing Status:   Future    Standing Expiration Date:   01/08/2020    INTERVAL HISTORY: Please see below for problem oriented charting. She returns for further follow-up She continues to have chronic dysphagia but is able to maintain her weight.  She has gained weight since last time I saw her Denies cough, chest pain or shortness of breath No new lymphadenopathy She has not seen ENT for a while She denies smoking or drinking  SUMMARY OF ONCOLOGIC HISTORY: Oncology History    Cancer of base of tongue ([place])   Staging form: Lip and Oral Cavity, AJCC 7th Edition     Clinical stage from 10/14/2015: Stage IVA (T2, N2b, M0) - Signed by [name], MD on 10/19/2015       Cancer of base of tongue ([place])   09/29/2015 Pathology Results    Accession: [id number] FNA right neck showed necrotic cells suspicious for squamous cell cancer    09/29/2015 Pathology Results    Accession: [id number] Mouth biopsy showed pyogenic granuloma    10/08/2015 Imaging    CT neck showed 1. 3 cm right tongue base mass consistent with malignancy.2. Necrotic right level II and III nodal metastases.3. Enlarged right thyroid lobe containing multiple nodules.    10/14/2015 Imaging    PET scan showed 1. Hypermetabolic right tongue base mass with hypermetabolic right level II and III adenopathy. Small left level III lymph node appears mildly hypermetabolic as well.2. A few scattered tiny pulmonary nodules are too small for PETresolution    10/16/2015 Imaging    [place] Head/Neck:  1) Multiple thyroid lesions bilaterally; dominant nodule in right lower pole measures 22 mm and meets fine-needle aspiration criteria. 2) Complex mass within the soft tissues of the right side of neck worrisome for metastatic malignancy.    10/26/2015 Pathology Results    Accession [id number]:  Thyroid FNA - Consistent with benign follicular nodule.    10/28/2015 Surgery    Multiple tooth  extractions (26), right tongue base biopsy.    10/28/2015 Pathology Results    Accession [id number]:  Tongue, right base -  Infiltrative SCC, p16 positive.  Actinomyces infection.    11/05/2015 Procedure    Port-a-cath and PEG placed.    11/11/2015 - 01/05/2016 Radiation Therapy    Site/dose:   Base of tongue and bilateral neck / 70 Gy in 35 fractions to gross disease, 63 Gy in 35 fractions to high risk nodal echelons, and 56 Gy in 35 fractions to intermediate risk nodal echelons Beams/energy:   Helical IMRT / 6 MV photons    11/11/2015 - 11/12/2015  Chemotherapy    She received high dose cisplatin. Treatment was discontinued permanently due to severe side-effects    11/13/2015 Adverse Reaction    She presented with intractable nausea and vomiting    11/13/2015 - 11/30/2015 Hospital Admission    She was hospitalized for intractable nausea, vomiting and developed acute renal failure, reversed with aggressive IV fluid hydration. Subsequently, she was found to have intra-abdominal infection with MRSA requiring placement of drainage tube and IV ABx    11/24/2015 Imaging    CT scan showed large abdominal abscess    11/25/2015 Procedure    She had placement of drain    12/08/2015 Procedure    Intra-abdominal drain was removed    12/08/2015 Imaging    CT abdomen showed resolution of abscess and a new left lower base lung nodules    12/31/2015 Imaging    Enlarging left lower lobe pulmonary nodule. This could represent a hematogenous metastasis although the rate of enlargement is faster than typical.    02/16/2016 Imaging    Pulmonary nodule in the left lower lobe has increased in size from 12/08/2015. This is a new finding when compared with 10/13/2014. Findings are worrisome for progression of pulmonary metastasis    03/14/2016 - 03/23/2016 Radiation Therapy    Site/dose:  Left lower lung / 60 Gy in 5 fractions.  Beams/energy:   SBRT-3D / 6FFF    10/07/2016 Imaging    CT neck showed stable exam compared to 07/07/2016, including the persistently enlarged right jugulodigastric node which was characterized on PET-CT 04/05/2016. No evidence of new or progressive disease    10/07/2016 Imaging    Continued decrease in size of the left lower lobe pulmonary nodule which now measures 5 x 5 mm and is surrounded by radiation changes. 2. Stable small right upper lobe pulmonary nodules. No new pulmonary lesions. 3. Stable biapical pleural and parenchymal scarring changes. 4. No mediastinal or hilar mass or adenopathy. 5. Stable multinodular right thyroid  goiter. 6. No findings for upper abdominal metastatic disease. Stable left adrenal gland adenoma.    12/29/2016 Imaging    Barium swallow shows a long segment of narrowing in the cervical esophagus which would not pass a 13 mm tablet. Also reflux     01/05/2017 Imaging    Ct chest: Radiation change in the left lower lobe is similar. The previously described 5 mm pulmonary nodule is difficult to differentiate from surrounding radiation change. 2. Similar right-sided pulmonary nodules. No enlarging or suspicious pulmonary nodule identified. 3. Coronary artery atherosclerosis. Aortic atherosclerosis.    06/19/2017 Procedure    Successful exchange of the gastrostomy tube. The patient has a 75 [place] balloon retention catheter.    10/09/2017 Imaging    1. No evidence of lung cancer metastasis. 2. 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 Recommend continued surveillance.  3. Stable apical scarring. 4. Nodule liver suggests cirrhosis    12/21/2017 Procedure    Successful exchange of existing gastrostomy tube(Foley) for new 20 french balloon retention gastrostomy tube    03/19/2018 Imaging    1. Interval increase in nodular area of consolidation within the subpleural left lower lobe which may represent evolving post treatment/radiation changes. Localized recurrence at this level is not entirely excluded. 2. Stable biapical pleuroparenchymal scarring. 3. Aortic Atherosclerosis (ICD10-I70.0).    03/27/2018 PET scan    1. Triangular band of density in the left lower lobe at the site of the prior hypermetabolic pulmonary nodule is currently about 2.1. This is probably related to radiation fibrosis. Surveillance is likely warranted to exclude enlargement. 2. No recurrence in the neck. 3. Other imaging findings of potential clinical significance: Aortoiliac atherosclerotic vascular disease. Cholelithiasis. Right thyroid nodule is not  hypermetabolic and is likely benign.    07/02/2018 Procedure    Successful bedside exchange of a new 20-French gastrostomy tube with contrast injection confirming appropriate position functionality. The new gastrostomy tube is ready for immediate use.    07/30/2018 Imaging    1. No findings suspicious for recurrent metastatic disease in the chest. 2. 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 3. Mild cylindrical bronchiectasis with associated patchy mucoid impaction in the right middle lobe and lingula, slightly worsened. 4. Stable heterogeneous 3.0 cm posterior right thyroid lobe nodule.  Aortic Atherosclerosis (ICD10-I70.0).     REVIEW OF SYSTEMS:   Constitutional: Denies fevers, chills or abnormal weight loss Eyes: Denies blurriness of vision Ears, nose, mouth, throat, and face: Denies mucositis or sore throat Respiratory: Denies cough, dyspnea or wheezes Cardiovascular: Denies palpitation, chest discomfort or lower extremity swelling Gastrointestinal:  Denies nausea, heartburn or change in bowel habits Skin: Denies abnormal skin rashes Lymphatics: Denies new lymphadenopathy or easy bruising Neurological:Denies numbness, tingling or new weaknesses Behavioral/Psych: Mood is stable, no new changes  All other systems were reviewed with the patient and are negative.  I have reviewed the past medical history, past surgical history, social history and family history with the patient and they are unchanged from previous note.  ALLERGIES:  is allergic to chlorhexidine gluconate and morphine and related.  MEDICATIONS:  Current Outpatient Medications  Medication Sig Dispense Refill  . lidocaine-prilocaine (EMLA) cream Apply 1 application topically as needed. (Patient taking differently: Apply 1 application topically as needed (prior to port being accessed). ) 30 g 6   No current facility-administered medications for this visit.     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 LYMPH:  no palpable lymphadenopathy in the cervical, axillary or inguinal LUNGS: clear to auscultation and percussion with normal breathing effort HEART: regular rate & rhythm and no murmurs and no lower extremity edema ABDOMEN:abdomen soft, non-tender and normal bowel sounds Musculoskeletal:no cyanosis of digits and no clubbing  NEURO: alert & oriented x 3 with fluent speech,  no focal motor/sensory deficits  LABORATORY DATA:  I have reviewed the data as listed    Component Value Date/Time   NA 139 12/03/2018 0901   NA 139 04/06/2017 0744   K 4.1 12/03/2018 0901   K 4.3 04/06/2017 0744   CL 102 12/03/2018 0901   CO2 29 12/03/2018 0901   CO2 24 04/06/2017 0744   GLUCOSE 125 (H) 12/03/2018 0901   GLUCOSE 90 04/06/2017 0744   BUN 32 (H) 12/03/2018 0901   BUN 44.1 (H) 04/06/2017 0744   CREATININE 0.92 12/03/2018 0901   CREATININE 1.1 04/06/2017 0744   CALCIUM 9.4 12/03/2018 0901   CALCIUM 10.1 04/06/2017 0744   PROT 7.0 12/03/2018 0901   PROT 6.9 04/06/2017 0744   ALBUMIN 4.6 12/03/2018 0901   ALBUMIN 3.9 04/06/2017 0744   AST 24 12/03/2018 0901   AST 25 04/06/2017 0744   ALT 19 12/03/2018 0901   ALT 19 04/06/2017 0744   ALKPHOS 78 12/03/2018 0901   ALKPHOS 112 04/06/2017 0744   BILITOT 0.6 12/03/2018 0901   BILITOT 0.63 04/06/2017 0744   GFRNONAA >60 12/03/2018 0901   GFRAA >60 12/03/2018 0901     No results found for: SPEP, UPEP  Lab Results  Component Value Date   WBC 4.8 12/03/2018   NEUTROABS 3.4 12/03/2018   HGB 10.2 (L) 12/03/2018   HCT 33.5 (L) 12/03/2018   MCV 66.2 (L) 12/03/2018   PLT 218 12/03/2018      Chemistry      Component Value Date/Time   NA 139 12/03/2018 0901   NA 139 04/06/2017 0744   K 4.1 12/03/2018 0901   K 4.3 04/06/2017 0744   CL 102 12/03/2018 0901   CO2 29 12/03/2018 0901   CO2 24 04/06/2017 0744   BUN 32 (H) 12/03/2018 0901   BUN 44.1 (H) 04/06/2017 0744   CREATININE 0.92 12/03/2018 0901   CREATININE 1.1 04/06/2017 0744      Component Value Date/Time   CALCIUM 9.4 12/03/2018 0901   CALCIUM 10.1 04/06/2017 0744   ALKPHOS 78 12/03/2018 0901   ALKPHOS 112 04/06/2017 0744   AST 24 12/03/2018 0901   AST 25 04/06/2017 0744   ALT 19 12/03/2018 0901   ALT 19 04/06/2017 0744   BILITOT 0.6 12/03/2018 0901   BILITOT 0.63 04/06/2017 0744     All questions were answered. The patient knows to call the clinic with any problems, questions or concerns. No barriers to learning was detected.  I spent 15 minutes counseling the patient face to face. The total time spent in the appointment was 20 minutes and more than 50% was on counseling and review of test results  [name], MD 12/04/2018 9:19 AM

## 2018-12-17 ENCOUNTER — Other Ambulatory Visit: Payer: Medicare Other

## 2019-01-14 ENCOUNTER — Other Ambulatory Visit: Payer: Self-pay

## 2019-01-15 ENCOUNTER — Inpatient Hospital Stay: Payer: Medicare Other

## 2019-01-28 ENCOUNTER — Other Ambulatory Visit: Payer: Medicare Other

## 2019-02-25 ENCOUNTER — Other Ambulatory Visit: Payer: Self-pay

## 2019-02-27 ENCOUNTER — Ambulatory Visit (HOSPITAL_COMMUNITY)
Admission: RE | Admit: 2019-02-27 | Discharge: 2019-02-27 | Disposition: A | Discharge status: Home / Self Care | Payer: Medicare Other | Source: Ambulatory Visit | Attending: Hematology and Oncology | Admitting: Hematology and Oncology

## 2019-02-27 ENCOUNTER — Other Ambulatory Visit: Payer: Self-pay

## 2019-02-27 ENCOUNTER — Other Ambulatory Visit: Payer: Self-pay | Admitting: Hematology and Oncology

## 2019-02-27 ENCOUNTER — Inpatient Hospital Stay: Payer: Medicare Other | Attending: Hematology and Oncology

## 2019-02-27 ENCOUNTER — Inpatient Hospital Stay: Payer: Medicare Other

## 2019-02-27 DIAGNOSIS — C01 Malignant neoplasm of base of tongue: Secondary | ICD-10-CM | POA: Diagnosis not present

## 2019-02-27 DIAGNOSIS — R5383 Other fatigue: Secondary | ICD-10-CM

## 2019-02-27 DIAGNOSIS — R1314 Dysphagia, pharyngoesophageal phase: Secondary | ICD-10-CM | POA: Diagnosis present

## 2019-02-27 DIAGNOSIS — I700 Atherosclerosis of aorta: Secondary | ICD-10-CM | POA: Diagnosis not present

## 2019-02-27 DIAGNOSIS — R1310 Dysphagia, unspecified: Secondary | ICD-10-CM | POA: Diagnosis not present

## 2019-02-27 DIAGNOSIS — C7802 Secondary malignant neoplasm of left lung: Secondary | ICD-10-CM | POA: Diagnosis present

## 2019-02-27 DIAGNOSIS — Z95828 Presence of other vascular implants and grafts: Secondary | ICD-10-CM

## 2019-02-27 DIAGNOSIS — Z923 Personal history of irradiation: Secondary | ICD-10-CM | POA: Diagnosis not present

## 2019-02-27 DIAGNOSIS — E041 Nontoxic single thyroid nodule: Secondary | ICD-10-CM | POA: Insufficient documentation

## 2019-02-27 DIAGNOSIS — Z452 Encounter for adjustment and management of vascular access device: Secondary | ICD-10-CM | POA: Diagnosis present

## 2019-02-27 DIAGNOSIS — Z9221 Personal history of antineoplastic chemotherapy: Secondary | ICD-10-CM | POA: Insufficient documentation

## 2019-02-27 LAB — CMP (CANCER CENTER ONLY)
ALT: 13 U/L (ref 0–44)
AST: 21 U/L (ref 15–41)
Albumin: 4.2 g/dL (ref 3.5–5.0)
Alkaline Phosphatase: 90 U/L (ref 38–126)
Anion gap: 9 (ref 5–15)
BUN: 23 mg/dL (ref 8–23)
CO2: 24 mmol/L (ref 22–32)
Calcium: 9.3 mg/dL (ref 8.9–10.3)
Chloride: 106 mmol/L (ref 98–111)
Creatinine: 1.03 mg/dL — ABNORMAL HIGH (ref 0.44–1.00)
GFR, Est AFR Am: >60 mL/min (ref >60)
GFR, Estimated: 55 mL/min — ABNORMAL LOW (ref >60)
Glucose, Bld: 101 mg/dL — ABNORMAL HIGH (ref 70–99)
Potassium: 4 mmol/L (ref 3.5–5.1)
Sodium: 139 mmol/L (ref 135–145)
Total Bilirubin: 0.4 mg/dL (ref 0.3–1.2)
Total Protein: 6.8 g/dL (ref 6.5–8.1)

## 2019-02-27 LAB — CBC WITH DIFFERENTIAL/PLATELET
Abs Immature Granulocytes: 0.02 10*3/uL (ref 0.00–0.07)
Basophils Absolute: 0 10*3/uL (ref 0.0–0.1)
Basophils Relative: 1 %
Eosinophils Absolute: 0.1 10*3/uL (ref 0.0–0.5)
Eosinophils Relative: 1 %
HCT: 32.1 % — ABNORMAL LOW (ref 36.0–46.0)
Hemoglobin: 9.8 g/dL — ABNORMAL LOW (ref 12.0–15.0)
Immature Granulocytes: 0 %
Lymphocytes Relative: 13 %
Lymphs Abs: 0.7 10*3/uL (ref 0.7–4.0)
MCH: 20.2 pg — ABNORMAL LOW (ref 26.0–34.0)
MCHC: 30.5 g/dL (ref 30.0–36.0)
MCV: 66 fL — ABNORMAL LOW (ref 80.0–100.0)
Monocytes Absolute: 0.6 10*3/uL (ref 0.1–1.0)
Monocytes Relative: 11 %
Neutro Abs: 3.8 10*3/uL (ref 1.7–7.7)
Neutrophils Relative %: 74 %
Platelets: 191 10*3/uL (ref 150–400)
RBC: 4.86 MIL/uL (ref 3.87–5.11)
RDW: 14.9 % (ref 11.5–15.5)
WBC: 5.2 10*3/uL (ref 4.0–10.5)
nRBC: 0 % (ref 0.0–0.2)

## 2019-02-27 LAB — TSH: TSH: 2.429 u[IU]/mL (ref 0.308–3.960)

## 2019-02-27 MED ORDER — HEPARIN SOD (PORK) LOCK FLUSH 100 UNIT/ML IV SOLN
500.0000 [IU] | Freq: Once | INTRAVENOUS | Status: AC
  Administered 2019-02-27: 500 [IU] via INTRAVENOUS

## 2019-02-27 MED ORDER — HEPARIN SOD (PORK) LOCK FLUSH 100 UNIT/ML IV SOLN
INTRAVENOUS | Status: AC
  Filled 2019-02-27: qty 5

## 2019-02-27 MED ORDER — SODIUM CHLORIDE (PF) 0.9 % IJ SOLN
INTRAMUSCULAR | Status: AC
  Filled 2019-02-27: qty 50

## 2019-02-27 MED ORDER — IOHEXOL 300 MG/ML  SOLN
75.0000 mL | Freq: Once | INTRAMUSCULAR | Status: AC | PRN
  Administered 2019-02-27: 75 mL via INTRAVENOUS

## 2019-02-27 MED ORDER — SODIUM CHLORIDE 0.9% FLUSH
10.0000 mL | INTRAVENOUS | Status: DC | PRN
  Administered 2019-02-27: 10 mL
  Filled 2019-02-27: qty 10

## 2019-02-28 ENCOUNTER — Encounter: Payer: Self-pay | Admitting: Hematology and Oncology

## 2019-02-28 ENCOUNTER — Other Ambulatory Visit: Payer: Self-pay

## 2019-02-28 ENCOUNTER — Inpatient Hospital Stay (HOSPITAL_BASED_OUTPATIENT_CLINIC_OR_DEPARTMENT_OTHER): Payer: Medicare Other | Admitting: Hematology and Oncology

## 2019-02-28 ENCOUNTER — Telehealth: Payer: Self-pay | Admitting: Hematology and Oncology

## 2019-02-28 DIAGNOSIS — C7802 Secondary malignant neoplasm of left lung: Secondary | ICD-10-CM

## 2019-02-28 DIAGNOSIS — R1310 Dysphagia, unspecified: Secondary | ICD-10-CM | POA: Diagnosis not present

## 2019-02-28 DIAGNOSIS — I700 Atherosclerosis of aorta: Secondary | ICD-10-CM

## 2019-02-28 DIAGNOSIS — C01 Malignant neoplasm of base of tongue: Secondary | ICD-10-CM

## 2019-02-28 DIAGNOSIS — Z9221 Personal history of antineoplastic chemotherapy: Secondary | ICD-10-CM

## 2019-02-28 DIAGNOSIS — E041 Nontoxic single thyroid nodule: Secondary | ICD-10-CM | POA: Insufficient documentation

## 2019-02-28 DIAGNOSIS — Z923 Personal history of irradiation: Secondary | ICD-10-CM

## 2019-02-28 NOTE — Assessment & Plan Note (Signed)
Her thyroid nodule has been stable and previously not hypermetabolic on PET scan TSH is within normal limits Observe only

## 2019-02-28 NOTE — Assessment & Plan Note (Signed)
Clinically, she has no signs or symptoms of cancer recurrence I discussed the importance of ENT follow-up I will see her back in a year

## 2019-02-28 NOTE — Telephone Encounter (Signed)
Appt scheduled for 75yr per sch msg on 02/27/20 at 930am w/Dr. Alvy Bimler and 02/26/20 for labs

## 2019-02-28 NOTE — Assessment & Plan Note (Signed)
CT imaging of her chest has been stable and the size of the nodule in the left lower lung is regressing I will monitor that clinically from now on I recommend port removal and she agreed

## 2019-02-28 NOTE — Progress Notes (Signed)
[place] OFFICE PROGRESS NOTE  Patient Care Team: [name], MD as PCP - General (Hematology and Oncology) [name], MD as Consulting Physician (Hematology and Oncology) [name], MD as Attending Physician (Radiation Oncology) [name], RD as Dietitian (Nutrition)  ASSESSMENT & PLAN:  Cancer of base of tongue ([place]) Clinically, she has no signs or symptoms of cancer recurrence I discussed the importance of ENT follow-up I will see her back in a year  Metastasis to lung [facility]) CT imaging of her chest has been stable and the size of the nodule in the left lower lung is regressing I will monitor that clinically from now on I recommend port removal and she agreed  Thyroid nodule Her thyroid nodule has been stable and previously not hypermetabolic on PET scan TSH is within normal limits Observe only   Orders Placed This Encounter  Procedures  . CT Chest Wo Contrast    Standing Status:   Future    Standing Expiration Date:   02/28/2020    Order Specific Question:   Preferred imaging location?    Answer:   [facility]    Order Specific Question:   Radiology Contrast Protocol - do NOT remove file path    Answer:   [email or web address]  . IR REMOVAL TUN ACCESS W/ PORT W/O FL MOD SED    Standing Status:   Future    Standing Expiration Date:   04/29/2020    Order Specific Question:   Reason for exam:    Answer:   no need port    Order Specific Question:   Preferred Imaging Location?    Answer:   [facility]  . Comprehensive metabolic panel    Standing Status:   Future    Standing Expiration Date:   04/03/2020  . CBC with Differential/Platelet    Standing Status:   Future    Standing Expiration Date:   04/03/2020  . TSH    Standing Status:   Future    Standing Expiration Date:   04/03/2020    INTERVAL HISTORY: Please see below for problem oriented charting. She returns for further follow-up She feels  well Denies cough Her feeding tube was removed recently and is slowly healing well She continues to have dysphagia but able to tolerate soft diet and maintain her weight Denies recent smoking or drinking  SUMMARY OF ONCOLOGIC HISTORY: Oncology History   Cancer of base of tongue ([place])   Staging form: Lip and Oral Cavity, AJCC 7th Edition     Clinical stage from 10/14/2015: Stage IVA (T2, N2b, M0) - Signed by [name], MD on 10/19/2015       Cancer of base of tongue ([place])   09/29/2015 Pathology Results    Accession: [id number] FNA right neck showed necrotic cells suspicious for squamous cell cancer    09/29/2015 Pathology Results    Accession: [id number] Mouth biopsy showed pyogenic granuloma    10/08/2015 Imaging    CT neck showed 1. 3 cm right tongue base mass consistent with malignancy.2. Necrotic right level II and III nodal metastases.3. Enlarged right thyroid lobe containing multiple nodules.    10/14/2015 Imaging    PET scan showed 1. Hypermetabolic right tongue base mass with hypermetabolic right level II and III adenopathy. Small left level III lymph node appears mildly hypermetabolic as well.2. A few scattered tiny pulmonary nodules are too small for PETresolution    10/16/2015 Imaging    [place] Head/Neck:  1) Multiple thyroid  lesions bilaterally; dominant nodule in right lower pole measures 22 mm and meets fine-needle aspiration criteria. 2) Complex mass within the soft tissues of the right side of neck worrisome for metastatic malignancy.    10/26/2015 Pathology Results    Accession [id number]:  Thyroid FNA - Consistent with benign follicular nodule.    10/28/2015 Surgery    Multiple tooth extractions (26), right tongue base biopsy.    10/28/2015 Pathology Results    Accession [id number]:  Tongue, right base -  Infiltrative SCC, p16 positive.  Actinomyces infection.    11/05/2015 Procedure    Port-a-cath and PEG placed.    11/11/2015 - 01/05/2016 Radiation Therapy    Site/dose:   Base of  tongue and bilateral neck / 70 Gy in 35 fractions to gross disease, 63 Gy in 35 fractions to high risk nodal echelons, and 56 Gy in 35 fractions to intermediate risk nodal echelons Beams/energy:   Helical IMRT / 6 MV photons    11/11/2015 - 11/12/2015 Chemotherapy    She received high dose cisplatin. Treatment was discontinued permanently due to severe side-effects    11/13/2015 Adverse Reaction    She presented with intractable nausea and vomiting    11/13/2015 - 11/30/2015 Hospital Admission    She was hospitalized for intractable nausea, vomiting and developed acute renal failure, reversed with aggressive IV fluid hydration. Subsequently, she was found to have intra-abdominal infection with MRSA requiring placement of drainage tube and IV ABx    11/24/2015 Imaging    CT scan showed large abdominal abscess    11/25/2015 Procedure    She had placement of drain    12/08/2015 Procedure    Intra-abdominal drain was removed    12/08/2015 Imaging    CT abdomen showed resolution of abscess and a new left lower base lung nodules    12/31/2015 Imaging    Enlarging left lower lobe pulmonary nodule. This could represent a hematogenous metastasis although the rate of enlargement is faster than typical.    02/16/2016 Imaging    Pulmonary nodule in the left lower lobe has increased in size from 12/08/2015. This is a new finding when compared with 10/13/2014. Findings are worrisome for progression of pulmonary metastasis    03/14/2016 - 03/23/2016 Radiation Therapy    Site/dose:  Left lower lung / 60 Gy in 5 fractions.  Beams/energy:   SBRT-3D / 6FFF    10/07/2016 Imaging    CT neck showed stable exam compared to 07/07/2016, including the persistently enlarged right jugulodigastric node which was characterized on PET-CT 04/05/2016. No evidence of new or progressive disease    10/07/2016 Imaging    Continued decrease in size of the left lower lobe pulmonary nodule which now measures 5 x 5 mm and is surrounded  by radiation changes. 2. Stable small right upper lobe pulmonary nodules. No new pulmonary lesions. 3. Stable biapical pleural and parenchymal scarring changes. 4. No mediastinal or hilar mass or adenopathy. 5. Stable multinodular right thyroid goiter. 6. No findings for upper abdominal metastatic disease. Stable left adrenal gland adenoma.    12/29/2016 Imaging    Barium swallow shows a long segment of narrowing in the cervical esophagus which would not pass a 13 mm tablet. Also reflux     01/05/2017 Imaging    Ct chest: Radiation change in the left lower lobe is similar. The previously described 5 mm pulmonary nodule is difficult to differentiate from surrounding radiation change. 2. Similar right-sided pulmonary nodules. No enlarging or suspicious pulmonary nodule  identified. 3. Coronary artery atherosclerosis. Aortic atherosclerosis.    06/19/2017 Procedure    Successful exchange of the gastrostomy tube. The patient has a 76 [place] balloon retention catheter.    10/09/2017 Imaging    1. No evidence of lung cancer metastasis. 2. 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 Recommend continued surveillance. 3. Stable apical scarring. 4. Nodule liver suggests cirrhosis    12/21/2017 Procedure    Successful exchange of existing gastrostomy tube(Foley) for new 20 french balloon retention gastrostomy tube    03/19/2018 Imaging    1. Interval increase in nodular area of consolidation within the subpleural left lower lobe which may represent evolving post treatment/radiation changes. Localized recurrence at this level is not entirely excluded. 2. Stable biapical pleuroparenchymal scarring. 3. Aortic Atherosclerosis (ICD10-I70.0).    03/27/2018 PET scan    1. Triangular band of density in the left lower lobe at the site of the prior hypermetabolic pulmonary nodule is currently about 2.1. This is probably related to radiation fibrosis.  Surveillance is likely warranted to exclude enlargement. 2. No recurrence in the neck. 3. Other imaging findings of potential clinical significance: Aortoiliac atherosclerotic vascular disease. Cholelithiasis. Right thyroid nodule is not hypermetabolic and is likely benign.    07/02/2018 Procedure    Successful bedside exchange of a new 20-French gastrostomy tube with contrast injection confirming appropriate position functionality. The new gastrostomy tube is ready for immediate use.    07/30/2018 Imaging    1. No findings suspicious for recurrent metastatic disease in the chest. 2. 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 3. Mild cylindrical bronchiectasis with associated patchy mucoid impaction in the right middle lobe and lingula, slightly worsened. 4. Stable heterogeneous 3.0 cm posterior right thyroid lobe nodule.  Aortic Atherosclerosis (ICD10-I70.0).    02/27/2019 Imaging    1. Nodular consolidation in the left lower lobe is unchanged as are scattered tiny bilateral pulmonary nodules. 2. Heterogeneous right thyroid nodule, as before. Consider further evaluation with thyroid ultrasound. If patient is clinically hyperthyroid, consider nuclear medicine thyroid uptake and scan. 3.  Aortic atherosclerosis (ICD10-170.0).     REVIEW OF SYSTEMS:   Constitutional: Denies fevers, chills or abnormal weight loss Eyes: Denies blurriness of vision Ears, nose, mouth, throat, and face: Denies mucositis or sore throat Respiratory: Denies cough, dyspnea or wheezes Cardiovascular: Denies palpitation, chest discomfort or lower extremity swelling Gastrointestinal:  Denies nausea, heartburn or change in bowel habits Skin: Denies abnormal skin rashes Lymphatics: Denies new lymphadenopathy or easy bruising Neurological:Denies numbness, tingling or new weaknesses Behavioral/Psych: Mood is stable, no new changes  All other systems were reviewed with the patient  and are negative.  I have reviewed the past medical history, past surgical history, social history and family history with the patient and they are unchanged from previous note.  ALLERGIES:  is allergic to chlorhexidine gluconate and morphine and related.  MEDICATIONS:  Current Outpatient Medications  Medication Sig Dispense Refill  . lidocaine-prilocaine (EMLA) cream Apply 1 application topically as needed. (Patient taking differently: Apply 1 application topically as needed (prior to port being accessed). ) 30 g 6   No current facility-administered medications for this visit.     PHYSICAL EXAMINATION: ECOG PERFORMANCE STATUS: 1 - Symptomatic but completely ambulatory  Vitals:   02/28/19 0942  BP: (!) 160/74  Pulse: 76  Resp: 18  Temp: 98.2 F (36.8 C)  SpO2: 100%   Filed Weights  02/28/19 0942  Weight: 131 lb 6.4 oz (59.6 kg)    GENERAL:alert, no distress and comfortable SKIN: skin color, texture, turgor are normal, no rashes or significant lesions EYES: normal, Conjunctiva are pink and non-injected, sclera clear OROPHARYNX:no exudate, no erythema and lips, buccal mucosa, and tongue normal  NECK: supple, thyroid normal size, non-tender, without nodularity LYMPH:  no palpable lymphadenopathy in the cervical, axillary or inguinal LUNGS: clear to auscultation and percussion with normal breathing effort HEART: regular rate & rhythm and no murmurs and no lower extremity edema ABDOMEN:abdomen soft, non-tender and normal bowel sounds.  The feeding tube site is healing well Musculoskeletal:no cyanosis of digits and no clubbing  NEURO: alert & oriented x 3 with fluent speech, no focal motor/sensory deficits  LABORATORY DATA:  I have reviewed the data as listed    Component Value Date/Time   NA 139 02/27/2019 0820   NA 139 04/06/2017 0744   K 4.0 02/27/2019 0820   K 4.3 04/06/2017 0744   CL 106 02/27/2019 0820   CO2 24 02/27/2019 0820   CO2 24 04/06/2017 0744   GLUCOSE  101 (H) 02/27/2019 0820   GLUCOSE 90 04/06/2017 0744   BUN 23 02/27/2019 0820   BUN 44.1 (H) 04/06/2017 0744   CREATININE 1.03 (H) 02/27/2019 0820   CREATININE 1.1 04/06/2017 0744   CALCIUM 9.3 02/27/2019 0820   CALCIUM 10.1 04/06/2017 0744   PROT 6.8 02/27/2019 0820   PROT 6.9 04/06/2017 0744   ALBUMIN 4.2 02/27/2019 0820   ALBUMIN 3.9 04/06/2017 0744   AST 21 02/27/2019 0820   AST 25 04/06/2017 0744   ALT 13 02/27/2019 0820   ALT 19 04/06/2017 0744   ALKPHOS 90 02/27/2019 0820   ALKPHOS 112 04/06/2017 0744   BILITOT 0.4 02/27/2019 0820   BILITOT 0.63 04/06/2017 0744   GFRNONAA 55 (L) 02/27/2019 0820   GFRAA >60 02/27/2019 0820    No results found for: SPEP, UPEP  Lab Results  Component Value Date   WBC 5.2 02/27/2019   NEUTROABS 3.8 02/27/2019   HGB 9.8 (L) 02/27/2019   HCT 32.1 (L) 02/27/2019   MCV 66.0 (L) 02/27/2019   PLT 191 02/27/2019      Chemistry      Component Value Date/Time   NA 139 02/27/2019 0820   NA 139 04/06/2017 0744   K 4.0 02/27/2019 0820   K 4.3 04/06/2017 0744   CL 106 02/27/2019 0820   CO2 24 02/27/2019 0820   CO2 24 04/06/2017 0744   BUN 23 02/27/2019 0820   BUN 44.1 (H) 04/06/2017 0744   CREATININE 1.03 (H) 02/27/2019 0820   CREATININE 1.1 04/06/2017 0744      Component Value Date/Time   CALCIUM 9.3 02/27/2019 0820   CALCIUM 10.1 04/06/2017 0744   ALKPHOS 90 02/27/2019 0820   ALKPHOS 112 04/06/2017 0744   AST 21 02/27/2019 0820   AST 25 04/06/2017 0744   ALT 13 02/27/2019 0820   ALT 19 04/06/2017 0744   BILITOT 0.4 02/27/2019 0820   BILITOT 0.63 04/06/2017 0744       RADIOGRAPHIC STUDIES: I have reviewed imaging studies with the patient I have personally reviewed the radiological images as listed and agreed with the findings in the report. Ct Chest W Contrast  Result Date: 02/27/2019 CLINICAL DATA:  Base of tongue cancer metastatic to the lung. EXAM: CT CHEST WITH CONTRAST TECHNIQUE: Multidetector CT imaging of the chest  was performed during intravenous contrast administration. CONTRAST:  46mL OMNIPAQUE IOHEXOL  300 MG/ML  SOLN COMPARISON:  07/30/2018. FINDINGS: Cardiovascular: Right IJ Port-A-Cath terminates in the SVC. Atherosclerotic calcification of the aorta. Heart size normal. Small amount of pericardial fluid is presumably physiologic. Mediastinum/Nodes: Heterogeneous right thyroid nodule measures 3.2 cm, similar. No pathologically enlarged mediastinal, hilar or axillary lymph nodes. Esophagus is grossly unremarkable. Lungs/Pleura: Presumed radiation scarring in both lung apices. Small bilateral lower lobe pulmonary nodules are unchanged. Nodular consolidation in the left lower lobe measures 1.5 cm, stable. Mucoid impaction in the right middle lobe and lingula. No pleural fluid. Airway is unremarkable. Upper Abdomen: Visualized portions of the liver, adrenal glands and kidneys are unremarkable. Subcentimeter low-attenuation lesion in the spleen is unchanged but too small to characterize. Visualized portions of the pancreas, stomach and bowel are unremarkable. No upper abdominal adenopathy. Musculoskeletal: Degenerative changes in the spine. No worrisome lytic or sclerotic lesions. IMPRESSION: 1. Nodular consolidation in the left lower lobe is unchanged as are scattered tiny bilateral pulmonary nodules. 2. Heterogeneous right thyroid nodule, as before. Consider further evaluation with thyroid ultrasound. If patient is clinically hyperthyroid, consider nuclear medicine thyroid uptake and scan. 3.  Aortic atherosclerosis (ICD10-170.0). Electronically Signed   By: Lorin Picket M.D.   On: 02/27/2019 11:27    All questions were answered. The patient knows to call the clinic with any problems, questions or concerns. No barriers to learning was detected.  I spent 15 minutes counseling the patient face to face. The total time spent in the appointment was 20 minutes and more than 50% was on counseling and review of test  results  [name], MD 02/28/2019 9:55 AM

## 2019-03-02 ENCOUNTER — Other Ambulatory Visit: Payer: Self-pay

## 2019-03-02 ENCOUNTER — Ambulatory Visit (HOSPITAL_COMMUNITY)
Admission: EM | Admit: 2019-03-02 | Discharge: 2019-03-02 | Disposition: A | Discharge status: Home / Self Care | Payer: Medicare Other | Attending: Family Medicine | Admitting: Family Medicine

## 2019-03-02 ENCOUNTER — Ambulatory Visit (INDEPENDENT_AMBULATORY_CARE_PROVIDER_SITE_OTHER): Payer: Medicare Other

## 2019-03-02 ENCOUNTER — Encounter (HOSPITAL_COMMUNITY): Payer: Self-pay

## 2019-03-02 DIAGNOSIS — S42032A Displaced fracture of lateral end of left clavicle, initial encounter for closed fracture: Secondary | ICD-10-CM | POA: Diagnosis not present

## 2019-03-02 NOTE — ED Provider Notes (Signed)
[name]   [id number] 03/02/19 Arrival Time: 6568  ASSESSMENT & PLAN:  1. Traumatic closed fracture of distal clavicle with minimal displacement, left, initial encounter    I have personally viewed the imaging studies ordered this visit. Distal L clavicle fracture.  Orders Placed This Encounter  Procedures  . DG Shoulder Left  . Apply sling arm foam   Prefers OTC analgesics.  Follow-up Information    Schedule an appointment as soon as possible for a visit  with [facility].   Specialty:  Specialist Contact information: [address] [place] [address] [phone]          Reviewed expectations re: course of current medical issues. Questions answered. Outlined signs and symptoms indicating need for more acute intervention. Patient verbalized understanding. After Visit Summary given.  SUBJECTIVE: History from: patient. Debra Farrell is a 73 y.o. female who reports persistent marked pain of her left shoulder; described as aching and throbbing; without radiation. Onset: abrupt, today. Injury/trama: yes, reports falling onto left shoulder with immediate pain. Symptoms have progressed to a point and plateaued since beginning. Aggravating factors: movement. Alleviating factors: "holding still". Associated symptoms: none reported. Extremity sensation changes or weakness: none. Self treatment: ice has helped pain a little. History of similar: no. No head or neck injury reported. Ambulatory since fall.  Past Surgical History:  Procedure Laterality Date  . ABDOMINAL HYSTERECTOMY  1990   partial  . DIRECT LARYNGOSCOPY N/A 10/28/2015   Procedure: DIRECT LARYNGOSCOPY WITH BIOPSY;  Surgeon: [name], MD;  Location: [facility];  Service: ENT;  Laterality: N/A;  . DIRECT LARYNGOSCOPY N/A 03/13/2017   Procedure: DIRECT LARYNGOSCOPY;  Surgeon: [name], MD;  Location: [facility];  Service: ENT;  Laterality: N/A;  . DIRECT  LARYNGOSCOPY N/A 01/18/2018   Procedure: DIRECT LARYNGOSCOPY;  Surgeon: [name], MD;  Location: [facility];  Service: ENT;  Laterality: N/A;  . ESOPHAGOSCOPY N/A 10/28/2015   Procedure: ESOPHAGOSCOPY;  Surgeon: [name], MD;  Location: [facility];  Service: ENT;  Laterality: N/A;  . ESOPHAGOSCOPY WITH DILITATION N/A 03/13/2017   Procedure: ESOPHAGOSCOPY WITH DILITATION;  Surgeon: [name], MD;  Location: [facility];  Service: ENT;  Laterality: N/A;  . ESOPHAGOSCOPY WITH DILITATION N/A 01/18/2018   Procedure: ESOPHAGOSCOPY WITH DILITATION POSSIBLE BOTOX;  Surgeon: [name], MD;  Location: [facility];  Service: ENT;  Laterality: N/A;  . [place], [id number]  . IR CV LINE INJECTION  06/28/2018  . IR CV LINE INJECTION  07/23/2018  . IR REMOVE CV FIBRIN SHEATH  07/23/2018  . IR REPLACE G-TUBE SIMPLE WO FLUORO  06/19/2017  . IR REPLACE G-TUBE SIMPLE WO FLUORO  09/05/2017  . IR REPLACE G-TUBE SIMPLE WO FLUORO  12/21/2017  . IR REPLC GASTRO/COLONIC TUBE PERCUT W/FLUORO  07/02/2018  . IR US GUIDE VASC ACCESS RIGHT  07/23/2018  . LARYNGOSCOPY AND BRONCHOSCOPY N/A 10/28/2015   Procedure: BRONCHOSCOPY;  Surgeon: [name], MD;  Location: [facility];  Service: ENT;  Laterality: N/A;  . MULTIPLE EXTRACTIONS WITH ALVEOLOPLASTY N/A 10/28/2015   Procedure: Extraction of tooth #'s 2-12, 14,15,17,18,20-29, 31 with alveoloplasy and mandibular left torus reduction;  Surgeon: Lenn Cal, DDS;  Location: [facility];  Service: Oral Surgery;  Laterality: N/A;  . port-a-cath insertion    . RECTOCELE REPAIR    . TONSILLECTOMY     as a child  . urocele     correction surgery    ROS: As  per HPI. All other systems negative.   OBJECTIVE:  Vitals:   03/02/19 1414 03/02/19 1415  BP:  (!) 183/91  Pulse:  74  Resp:  16  Temp:  97.8 F (36.6 C)  TempSrc:  Oral  SpO2:  97%  Weight: 59 kg     General appearance: alert; no distress HEENT: [place]; AT Neck: supple with FROM Resp:  unlabored respirations Extremities: . LUE: warm and well perfused; poorly localized moderate tenderness over left anterior shoulder; moderate swelling present; she has difficulty moving shoulder secondary to pain; no bruising or open wounds CV: brisk extremity capillary refill of LUE; 2+ radial pulse of LUE. Skin: warm and dry; no visible rashes Neurologic: gait normal; normal reflexes of RUE and LUE; normal sensation of RUE and LUE Psychological: alert and cooperative; normal mood and affect  Imaging: Dg Shoulder Left  Result Date: 03/02/2019 CLINICAL DATA:  Fall, pain EXAM: LEFT SHOULDER - 2+ VIEW COMPARISON:  None. FINDINGS: Osteopenia. There is a slightly elevated fracture of the distal left clavicle. The most distal fracture fragment appears to remain in articulation with the acromion. The glenohumeral joint is preserved. Minimal arthrosis. Soft tissue edema overlying the clavicle. IMPRESSION: Osteopenia. There is a slightly elevated fracture of the distal left clavicle. The most distal fracture fragment appears to remain in articulation with the acromion. The glenohumeral joint is preserved. Electronically Signed   By: Eddie Candle M.D.   On: 03/02/2019 15:38   Allergies  Allergen Reactions  . Chlorhexidine Gluconate Itching    CHG SOAP   . Morphine And Related Nausea And Vomiting    Past Medical History:  Diagnosis Date  . Abdominal abscess 11/30/2015  . Anemia in chronic illness 12/16/2015  . Cancer of base of tongue ([place]) 10/14/2015  . Eczema   . GERD (gastroesophageal reflux disease)   . Hx of radiation therapy 11/11/15- 01/05/2016   Base of Tongue and bilateral neck  . Hx of radiation therapy 03/14/16- 03/23/16   Left Lower Lung  . Hyperlipidemia   . Hypertension    no meds  . Infection with methicillin-resistant Staphylococcus aureus (MRSA) 11/30/2015  . Intractable nausea and vomiting 11/13/2015  . Laceration 04/2014   around R ear, for falling out of bed & hitting nightstand  .  Nausea without vomiting 12/16/2015  . Thalassemia minor   . Throat pain in adult 12/29/2015   Social History   Socioeconomic History  . Marital status: Widowed    Spouse name: Not on file  . Number of children: 3  . Years of education: Not on file  . Highest education level: Not on file  Occupational History    Employer: [employer]  . Financial resource strain: Not on file  . Food insecurity:    Worry: Not on file    Inability: Not on file  . Transportation needs:    Medical: Not on file    Non-medical: Not on file  Tobacco Use  . Smoking status: Never Smoker  . Smokeless tobacco: Never Used  Substance and Sexual Activity  . Alcohol use: No  . Drug use: No  . Sexual activity: Never    Comment: widowed. 3 daughters. 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 Physical activity:    Days per week: Not on file    Minutes per session: Not on file  . Stress: Not on file  Relationships  . Social connections:    Talks on phone: Not on  file    Gets together: Not on file    Attends religious service: Not on file    Active member of club or organization: Not on file    Attends meetings of clubs or organizations: Not on file    Relationship status: Not on file  Other Topics Concern  . Not on file  Social History Narrative   The patient is widowed with 3 Daughters.    Moved to [facility] 2005.  RN at [employer].   Enjoys biking and walking.    Leeanne Mannan- Grandaughter- age 47- Has lived with [place] since birth.       Family History  Problem Relation Age of Onset  . Cancer Mother        lung  . Asthma Father    Past Surgical History:  Procedure Laterality Date  . ABDOMINAL HYSTERECTOMY  1990   partial  . DIRECT LARYNGOSCOPY N/A 10/28/2015   Procedure: DIRECT LARYNGOSCOPY WITH BIOPSY;  Surgeon: [name], MD;  Location: [facility];  Service: ENT;  Laterality: N/A;  . DIRECT LARYNGOSCOPY N/A 03/13/2017   Procedure: DIRECT LARYNGOSCOPY;  Surgeon: [name], MD;  Location: [facility];  Service: ENT;  Laterality: N/A;  . DIRECT LARYNGOSCOPY N/A 01/18/2018   Procedure: DIRECT LARYNGOSCOPY;  Surgeon: [name], MD;  Location: [facility];  Service: ENT;  Laterality: N/A;  . ESOPHAGOSCOPY N/A 10/28/2015   Procedure: ESOPHAGOSCOPY;  Surgeon: [name], MD;  Location: [facility];  Service: ENT;  Laterality: N/A;  . ESOPHAGOSCOPY WITH DILITATION N/A 03/13/2017   Procedure: ESOPHAGOSCOPY WITH DILITATION;  Surgeon: [name], MD;  Location: [facility];  Service: ENT;  Laterality: N/A;  . ESOPHAGOSCOPY WITH DILITATION N/A 01/18/2018   Procedure: ESOPHAGOSCOPY WITH DILITATION POSSIBLE BOTOX;  Surgeon: [name], MD;  Location: [facility];  Service: ENT;  Laterality: N/A;  . [place], [id number]  . IR CV LINE INJECTION  06/28/2018  . IR CV LINE INJECTION  07/23/2018  . IR REMOVE CV FIBRIN SHEATH  07/23/2018  . IR REPLACE G-TUBE SIMPLE WO FLUORO  06/19/2017  . IR REPLACE G-TUBE SIMPLE WO FLUORO  09/05/2017  . IR REPLACE G-TUBE SIMPLE WO FLUORO  12/21/2017  . IR REPLC GASTRO/COLONIC TUBE PERCUT W/FLUORO  07/02/2018  . IR US GUIDE VASC ACCESS RIGHT  07/23/2018  . LARYNGOSCOPY AND BRONCHOSCOPY N/A 10/28/2015   Procedure: BRONCHOSCOPY;  Surgeon: [name], MD;  Location: [facility];  Service: ENT;  Laterality: N/A;  . MULTIPLE EXTRACTIONS WITH ALVEOLOPLASTY N/A 10/28/2015   Procedure: Extraction of tooth #'s 2-12, 14,15,17,18,20-29, 31 with alveoloplasy and mandibular left torus reduction;  Surgeon: Lenn Cal, DDS;  Location: [facility];  Service: Oral Surgery;  Laterality: N/A;  . port-a-cath insertion    . RECTOCELE REPAIR    . TONSILLECTOMY     as a child  . urocele     correction surgery      Vanessa Kick, MD 03/04/19 [phone]

## 2019-03-02 NOTE — ED Triage Notes (Signed)
Pt states she tripped and fell and landed on her left shoulder. This happened about 12pm today.

## 2019-03-05 ENCOUNTER — Encounter (HOSPITAL_COMMUNITY): Payer: Self-pay

## 2019-03-05 ENCOUNTER — Other Ambulatory Visit: Payer: Self-pay

## 2019-03-05 ENCOUNTER — Ambulatory Visit (HOSPITAL_COMMUNITY)
Admission: EM | Admit: 2019-03-05 | Discharge: 2019-03-05 | Disposition: A | Discharge status: Home / Self Care | Payer: Medicare Other | Attending: Family Medicine | Admitting: Family Medicine

## 2019-03-05 DIAGNOSIS — L247 Irritant contact dermatitis due to plants, except food: Secondary | ICD-10-CM | POA: Diagnosis not present

## 2019-03-05 MED ORDER — PREDNISONE 20 MG PO TABS: ORAL_TABLET | ORAL | 0 refills | Status: DC

## 2019-03-05 NOTE — ED Triage Notes (Signed)
Patient presents to Urgent Care with complaints of rash since doing yard work this past weekend. Patient reports she was here for shoulder pain over the weekend but forgot to ask for prednisone for her hives.

## 2019-03-05 NOTE — Discharge Instructions (Addendum)
Take prednisone as directed May take Benadryl or Zyrtec for the itching  Congratulations on being released from your cancer therapy. It was good to see you again

## 2019-03-05 NOTE — ED Provider Notes (Signed)
[facility]    CSN: [id number] Arrival date & time: 03/05/19  1047     History   Chief Complaint Chief Complaint  Patient presents with  . Rash    HPI Debra Farrell is a 73 y.o. female.   HPI  Is seen 3 days ago for a fall and a fractured clavicle.  She is scheduled to see orthopedics tomorrow.  While she was here she had scattered contact dermatitis on her legs and forearms.  It was from working in the yard.  She was treating it with antihistamines and topical corticosteroids.  She is back today hoping to get oral steroids.  She states it always works better for her.  Past Medical History:  Diagnosis Date  . Abdominal abscess 11/30/2015  . Anemia in chronic illness 12/16/2015  . Cancer of base of tongue ([place]) 10/14/2015  . Eczema   . GERD (gastroesophageal reflux disease)   . Hx of radiation therapy 11/11/15- 01/05/2016   Base of Tongue and bilateral neck  . Hx of radiation therapy 03/14/16- 03/23/16   Left Lower Lung  . Hyperlipidemia   . Hypertension    no meds  . Infection with methicillin-resistant Staphylococcus aureus (MRSA) 11/30/2015  . Intractable nausea and vomiting 11/13/2015  . Laceration 04/2014   around R ear, for falling out of bed & hitting nightstand  . Nausea without vomiting 12/16/2015  . Thalassemia minor   . Throat pain in adult 12/29/2015    Patient Active Problem List   Diagnosis Date Noted  . Thyroid nodule 02/28/2019  . Port-A-Cath in place 09/10/2018  . Weight loss, abnormal 03/20/2018  . Hypothyroidism (acquired) 03/19/2018  . Other fatigue 04/07/2017  . 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 Granulation tissue of skin 09/02/2016  . Pancytopenia, acquired ([place]) 09/02/2016  . Preventive measure 09/02/2016  . Lymphedema in adult patient 05/13/2016  . Malignant neoplasm of lower lobe of left lung ([place]) 02/17/2016  . Malnutrition of moderate degree 01/04/2016  . Goals of care, counseling/discussion   . Dysphagia 12/31/2015  . Mucositis  due to radiation therapy 12/31/2015  . Encounter for palliative care   . Odynophagia   . Throat pain in adult 12/29/2015  . Nausea without vomiting 12/16/2015  . Anemia in chronic illness 12/16/2015  . Anemia due to other cause   . Dehydration   . Severe protein-calorie malnutrition ([place]) 11/14/2015  . Intractable nausea and vomiting 11/13/2015  . Gastritis, acute 11/13/2015  . Cancer of base of tongue ([place]) 10/14/2015  . Skin lesion of back 03/21/2013  . Lipoma of axilla 03/21/2013  . Sleep disorder 03/17/2013  . Skin lesion 03/17/2013  . Health care maintenance 03/17/2013  . Right knee pain 04/05/2012  . Health maintenance examination 10/17/2011  . Thalassemia 10/17/2011  . Essential hypertension 10/17/2011  . Hyperlipidemia 10/17/2011  . Headache(784.0) 10/17/2011    Past Surgical History:  Procedure Laterality Date  . ABDOMINAL HYSTERECTOMY  1990   partial  . DIRECT LARYNGOSCOPY N/A 10/28/2015   Procedure: DIRECT LARYNGOSCOPY WITH BIOPSY;  Surgeon: [name], MD;  Location: [facility];  Service: ENT;  Laterality: N/A;  . DIRECT LARYNGOSCOPY N/A 03/13/2017   Procedure: DIRECT LARYNGOSCOPY;  Surgeon: [name], MD;  Location: [facility];  Service: ENT;  Laterality: N/A;  . DIRECT LARYNGOSCOPY N/A 01/18/2018   Procedure: DIRECT LARYNGOSCOPY;  Surgeon: [name], MD;  Location: [facility];  Service: ENT;  Laterality: N/A;  . ESOPHAGOSCOPY N/A 10/28/2015   Procedure:  ESOPHAGOSCOPY;  Surgeon: [name], MD;  Location: [facility];  Service: ENT;  Laterality: N/A;  . ESOPHAGOSCOPY WITH DILITATION N/A 03/13/2017   Procedure: ESOPHAGOSCOPY WITH DILITATION;  Surgeon: [name], MD;  Location: [facility];  Service: ENT;  Laterality: N/A;  . ESOPHAGOSCOPY WITH DILITATION N/A 01/18/2018   Procedure: ESOPHAGOSCOPY WITH DILITATION POSSIBLE BOTOX;  Surgeon: [name], MD;  Location: [facility];  Service: ENT;  Laterality: N/A;  . [place],  [phone]  . IR CV LINE INJECTION  06/28/2018  . IR CV LINE INJECTION  07/23/2018  . IR REMOVE CV FIBRIN SHEATH  07/23/2018  . IR REPLACE G-TUBE SIMPLE WO FLUORO  06/19/2017  . IR REPLACE G-TUBE SIMPLE WO FLUORO  09/05/2017  . IR REPLACE G-TUBE SIMPLE WO FLUORO  12/21/2017  . IR REPLC GASTRO/COLONIC TUBE PERCUT W/FLUORO  07/02/2018  . IR US GUIDE VASC ACCESS RIGHT  07/23/2018  . LARYNGOSCOPY AND BRONCHOSCOPY N/A 10/28/2015   Procedure: BRONCHOSCOPY;  Surgeon: [name], MD;  Location: [facility];  Service: ENT;  Laterality: N/A;  . MULTIPLE EXTRACTIONS WITH ALVEOLOPLASTY N/A 10/28/2015   Procedure: Extraction of tooth #'s 2-12, 14,15,17,18,20-29, 31 with alveoloplasy and mandibular left torus reduction;  Surgeon: Lenn Cal, DDS;  Location: [facility];  Service: Oral Surgery;  Laterality: N/A;  . port-a-cath insertion    . RECTOCELE REPAIR    . TONSILLECTOMY     as a child  . urocele     correction surgery    OB History   No obstetric history on file.      Home Medications    Prior to Admission medications   Medication Sig Start Date End Date Taking? Authorizing Provider  lidocaine-prilocaine (EMLA) cream Apply 1 application topically as needed. Patient taking differently: Apply 1 application topically as needed (prior to port being accessed).  10/10/17   [name], MD  predniSONE (DELTASONE) 20 MG tablet Take 3 a day for 3 days, then 2 a day for 3 days, then 1 a day for 3 days 03/05/19   Raylene Everts, MD    Family History Family History  Problem Relation Age of Onset  . Cancer Mother        lung  . Asthma Father     Social History Social History   Tobacco Use  . Smoking status: Never Smoker  . Smokeless tobacco: Never Used  Substance Use Topics  . Alcohol use: No  . Drug use: No     Allergies   Chlorhexidine gluconate and Morphine and related   Review of Systems Review of Systems  Constitutional: Negative for chills and fever.  HENT: Negative for ear pain  and sore throat.   Eyes: Negative for pain and visual disturbance.  Respiratory: Negative for cough and shortness of breath.   Cardiovascular: Negative for chest pain and palpitations.  Gastrointestinal: Negative for abdominal pain and vomiting.  Genitourinary: Negative for dysuria and hematuria.  Musculoskeletal: Positive for arthralgias. Negative for back pain.  Skin: Positive for rash. Negative for color change.  Neurological: Negative for seizures and syncope.  All other systems reviewed and are negative.    Physical Exam Triage Vital Signs ED Triage Vitals  Enc Vitals Group     BP 03/05/19 1100 (!) 154/69     Pulse Rate 03/05/19 1100 82     Resp 03/05/19 1100 17     Temp 03/05/19 1100 98.5 F (36.9 C)     Temp Source 03/05/19 1100  Oral     SpO2 03/05/19 1100 98 %     Weight --      Height --      Head Circumference --      Peak Flow --      Pain Score 03/05/19 1058 7     Pain Loc --      Pain Edu? --      Excl. in [place]? --    No data found.  Updated Vital Signs BP (!) 154/69 (BP Location: Right Arm)   Pulse 82   Temp 98.5 F (36.9 C) (Oral)   Resp 17   SpO2 98%   Visual Acuity Right Eye Distance:   Left Eye Distance:   Bilateral Distance:    Right Eye Near:   Left Eye Near:    Bilateral Near:     Physical Exam Constitutional:      General: She is not in acute distress.    Appearance: She is well-developed.     Comments: Appears uncomfortable.  Holding ice pack to left shoulder.  Thin.  HENT:     Head: Normocephalic and atraumatic.  Eyes:     Conjunctiva/sclera: Conjunctivae normal.     Pupils: Pupils are equal, round, and reactive to light.  Neck:     Musculoskeletal: Normal range of motion.  Cardiovascular:     Rate and Rhythm: Normal rate and regular rhythm.     Heart sounds: Normal heart sounds.  Pulmonary:     Effort: Pulmonary effort is normal. No respiratory distress.     Breath sounds: Normal breath sounds.  Abdominal:     General:  There is no distension.     Palpations: Abdomen is soft.  Musculoskeletal: Normal range of motion.  Skin:    General: Skin is warm and dry.     Comments: Both legs and both forearms with numerous small scattered patches of linear vesicles on erythematous base.  Some excoriated.  Neurological:     Mental Status: She is alert.      UC Treatments / Results  Labs (all labs ordered are listed, but only abnormal results are displayed) Labs Reviewed - No data to display  EKG None  Radiology No results found.  Procedures Procedures (including critical care time)  Medications Ordered in UC Medications - No data to display  Initial Impression / Assessment and Plan / UC Course  I have reviewed the triage vital signs and the nursing notes.  Pertinent labs & imaging results that were available during my care of the patient were reviewed by me and considered in my medical decision making (see chart for details).      Final Clinical Impressions(s) / UC Diagnoses   Final diagnoses:  Irritant contact dermatitis due to plants, except food     Discharge Instructions     Take prednisone as directed May take Benadryl or Zyrtec for the itching  Congratulations on being released from your cancer therapy. It was good to see you again   ED Prescriptions    Medication Sig Dispense Auth. Provider   predniSONE (DELTASONE) 20 MG tablet Take 3 a day for 3 days, then 2 a day for 3 days, then 1 a day for 3 days 18 tablet Meda Coffee Jennette Banker, MD     Controlled Substance Prescriptions [place] Controlled Substance Registry consulted? Not Applicable   Raylene Everts, MD 03/05/19 2026

## 2019-03-20 ENCOUNTER — Other Ambulatory Visit: Payer: Self-pay | Admitting: Radiology

## 2019-03-21 ENCOUNTER — Encounter (HOSPITAL_COMMUNITY): Payer: Self-pay

## 2019-03-21 ENCOUNTER — Other Ambulatory Visit: Payer: Self-pay

## 2019-03-21 ENCOUNTER — Ambulatory Visit (HOSPITAL_COMMUNITY)
Admission: RE | Admit: 2019-03-21 | Discharge: 2019-03-21 | Disposition: A | Discharge status: Home / Self Care | Payer: Medicare Other | Source: Ambulatory Visit | Attending: Hematology and Oncology | Admitting: Hematology and Oncology

## 2019-03-21 DIAGNOSIS — Z452 Encounter for adjustment and management of vascular access device: Secondary | ICD-10-CM | POA: Diagnosis not present

## 2019-03-21 DIAGNOSIS — E041 Nontoxic single thyroid nodule: Secondary | ICD-10-CM | POA: Insufficient documentation

## 2019-03-21 DIAGNOSIS — C01 Malignant neoplasm of base of tongue: Secondary | ICD-10-CM | POA: Diagnosis not present

## 2019-03-21 DIAGNOSIS — C7802 Secondary malignant neoplasm of left lung: Secondary | ICD-10-CM | POA: Diagnosis not present

## 2019-03-21 HISTORY — PX: IR REMOVAL TUN ACCESS W/ PORT W/O FL MOD SED: IMG2290

## 2019-03-21 LAB — CBC
HCT: 32.1 % — ABNORMAL LOW (ref 36.0–46.0)
Hemoglobin: 9.7 g/dL — ABNORMAL LOW (ref 12.0–15.0)
MCH: 20.6 pg — ABNORMAL LOW (ref 26.0–34.0)
MCHC: 30.2 g/dL (ref 30.0–36.0)
MCV: 68.2 fL — ABNORMAL LOW (ref 80.0–100.0)
Platelets: 265 10*3/uL (ref 150–400)
RBC: 4.71 MIL/uL (ref 3.87–5.11)
RDW: 15.5 % (ref 11.5–15.5)
WBC: 6.1 10*3/uL (ref 4.0–10.5)
nRBC: 0 % (ref 0.0–0.2)

## 2019-03-21 LAB — PROTIME-INR
INR: 1 (ref 0.8–1.2)
Prothrombin Time: 13 seconds (ref 11.4–15.2)

## 2019-03-21 LAB — APTT: aPTT: 34 seconds (ref 24–36)

## 2019-03-21 MED ORDER — LIDOCAINE HCL 1 % IJ SOLN
INTRAMUSCULAR | Status: AC
  Filled 2019-03-21: qty 20

## 2019-03-21 MED ORDER — SODIUM CHLORIDE 0.9 % IV SOLN
INTRAVENOUS | Status: DC
  Administered 2019-03-21: 09:00:00 via INTRAVENOUS

## 2019-03-21 MED ORDER — CEFAZOLIN SODIUM-DEXTROSE 2-4 GM/100ML-% IV SOLN
2.0000 g | INTRAVENOUS | Status: AC
  Administered 2019-03-21: 2 g via INTRAVENOUS
  Filled 2019-03-21: qty 100

## 2019-03-21 NOTE — Discharge Instructions (Signed)
Implanted Port Removal, Care After  This sheet gives you information about how to care for yourself after your procedure. Your health care provider may also give you more specific instructions. If you have problems or questions, contact your health care provider.  What can I expect after the procedure?  After the procedure, it is common to have:   Soreness or pain near your incision.   Some swelling or bruising near your incision.  Follow these instructions at home:  Medicines   Take over-the-counter and prescription medicines only as told by your health care provider.   If you were prescribed an antibiotic medicine, take it as told by your health care provider. Do not stop taking the antibiotic even if you start to feel better.  Bathing   Do not take baths, swim, or use a hot tub until your health care provider approves. Ask your health care provider if you can take showers. You may only be allowed to take sponge baths.  Incision care     Follow instructions from your health care provider about how to take care of your incision. Make sure you:  ? Wash your hands with soap and water before you change your bandage (dressing). If soap and water are not available, use hand sanitizer.  ? Change your dressing as told by your health care provider.  ? Keep your dressing dry.  ? Leave stitches (sutures), skin glue, or adhesive strips in place. These skin closures may need to stay in place for 2 weeks or longer. If adhesive strip edges start to loosen and curl up, you may trim the loose edges. Do not remove adhesive strips completely unless your health care provider tells you to do that.   Check your incision area every day for signs of infection. Check for:  ? More redness, swelling, or pain.  ? More fluid or blood.  ? Warmth.  ? Pus or a bad smell.  Driving     Do not drive for 24 hours if you were given a medicine to help you relax (sedative) during your procedure.   If you did not receive a sedative, ask  your health care provider when it is safe to drive.  Activity   Return to your normal activities as told by your health care provider. Ask your health care provider what activities are safe for you.   Do not lift anything that is heavier than 10 lb (4.5 kg), or the limit that you are told, until your health care provider says that it is safe.   Do not do activities that involve lifting your arms over your head.  General instructions   Do not use any products that contain nicotine or tobacco, such as cigarettes and e-cigarettes. These can delay healing. If you need help quitting, ask your health care provider.   Keep all follow-up visits as told by your health care provider. This is important.  Contact a health care provider if:   You have more redness, swelling, or pain around your incision.   You have more fluid or blood coming from your incision.   Your incision feels warm to the touch.   You have pus or a bad smell coming from your incision.   You have pain that is not relieved by your pain medicine.  Get help right away if you have:   A fever or chills.   Chest pain.   Difficulty breathing.  Summary   After the procedure, it is common to   have pain, soreness, swelling, or bruising near your incision.   If you were prescribed an antibiotic medicine, take it as told by your health care provider. Do not stop taking the antibiotic even if you start to feel better.   Do not drive for 24 hours if you were given a sedative during your procedure.   Return to your normal activities as told by your health care provider. Ask your health care provider what activities are safe for you.  This information is not intended to replace advice given to you by your health care provider. Make sure you discuss any questions you have with your health care provider.  Document Released: 08/24/2015 Document Revised: 10/26/2017 Document Reviewed: 10/26/2017  Elsevier Interactive Patient Education  2019 [employer]

## 2019-03-21 NOTE — Procedures (Signed)
  Procedure: R IJ Port removal no longer needed EBL:   minimal Complications:  none immediate  See full dictation in [facility].  Dillard Cannon MD Main # [phone] Pager  [phone]

## 2019-03-21 NOTE — Progress Notes (Signed)
Patient Status: [facility] - Out-pt  Assessment and Plan: Lingual cancer s/p Port-A-Cath placement by Dr. Vernard Gambles 11/05/2015 for treatment of lingual cancer. She has completed therapy and is no longer in need of her Port-A-cath.  She presents for removal today.  She requests no sedation.  She has been NPO at of 3AM.  She does not take blood thinners.  IV placed for antibiotics only.   Risks and benefits of image guided port-a-catheter placement was discussed with the patient including, but not limited to bleeding, infection, pneumothorax, and need for additional procedures.  All of the patient's questions were answered, patient is agreeable to proceed. Consent signed and in chart.  ______________________________________________________________________   History of Present Illness: Debra Farrell is a 73 y.o. female with history of lingual cancer s/p chemotherapy via Port-A-Cath placed 11/05/15 by Dr. Vernard Gambles.  She presents today for removal at the request of Dr. Alvy Bimler.  Allergies and medications reviewed.   Review of Systems: A 12 point ROS discussed and pertinent positives are indicated in the HPI above.  All other systems are negative.  Review of Systems  Constitutional: Negative for fatigue and fever.  Respiratory: Negative for cough and shortness of breath.   Cardiovascular: Negative for chest pain.  Gastrointestinal: Negative for abdominal pain.  Genitourinary: Negative for dysuria.  Musculoskeletal: Negative for back pain.  Psychiatric/Behavioral: Negative for behavioral problems and confusion.    Vital Signs: BP (!) 183/80 (BP Location: Right Arm)   Pulse 71   Temp 98.3 F (36.8 C) (Oral)   Resp 18   Ht 5\' 6"  (1.676 m)   Wt 129 lb (58.5 kg)   SpO2 100%   BMI 20.82 kg/m   Physical Exam Constitutional:      Appearance: Normal appearance.  HENT:     Mouth/Throat:     Mouth: Mucous membranes are moist.     Pharynx: Oropharynx is clear.  Neck:   Musculoskeletal: Normal range of motion and neck supple.     Comments: Right-sided Port-A-Cath in place without issue. Cardiovascular:     Rate and Rhythm: Normal rate and regular rhythm.     Heart sounds: No murmur. No friction rub. No gallop.   Pulmonary:     Effort: Pulmonary effort is normal. No respiratory distress.     Breath sounds: Normal breath sounds.  Skin:    General: Skin is warm and dry.  Neurological:     General: No focal deficit present.     Mental Status: She is alert and oriented to person, place, and time. Mental status is at baseline.  Psychiatric:        Mood and Affect: Mood normal.        Behavior: Behavior normal.        Thought Content: Thought content normal.        Judgment: Judgment normal.      Imaging reviewed.   Labs:  COAGS: Recent Labs    03/21/19 0810  INR 1.0  APTT 34    BMP: Recent Labs    07/30/18 0915 12/03/18 0901 02/27/19 0820  NA 138 139 139  K 4.3 4.1 4.0  CL 104 102 106  CO2 26 29 24   GLUCOSE 83 125* 101*  BUN 24* 32* 23  CALCIUM 9.4 9.4 9.3  CREATININE 0.96 0.92 1.03*  GFRNONAA 59* >60 55*  GFRAA >60 >60 >60       Electronically Signed: Docia Barrier, PA 03/21/2019, 10:06 AM   I spent a total  of 15 minutes in face to face in clinical consultation, greater than 50% of which was counseling/coordinating care for venous access.

## 2019-05-03 ENCOUNTER — Encounter: Payer: Self-pay | Admitting: *Deleted

## 2019-05-03 NOTE — Progress Notes (Signed)
Oncology Nurse Navigator Documentation  Flowsheet/spreadsheet update.  Rick Andray Assefa, RN, BSN Head & Neck Oncology Nurse Navigator [facility] [place] [phone]   

## 2020-02-06 ENCOUNTER — Telehealth: Payer: Self-pay

## 2020-02-06 NOTE — Telephone Encounter (Signed)
Called and given lab appt date and time prior to scan. She verbalized understanding.

## 2020-02-06 NOTE — Telephone Encounter (Signed)
-----   Message from [name], MD sent at 02/06/2020  7:42 AM EDT ----- Regarding: CT scan Pls help her schedule CT scan on 6/1

## 2020-02-06 NOTE — Telephone Encounter (Signed)
Called and given above message. She is not sure if her insurance will pay and does not want to schedule. Sent a message to the prior authorization per to get authorization prior to scheduling. Will call her back.

## 2020-02-06 NOTE — Telephone Encounter (Signed)
Called back. Per prior authorization person for scan, no PA required. Given radiology scheduler number, she will call and schedule.

## 2020-02-25 ENCOUNTER — Other Ambulatory Visit: Payer: Self-pay

## 2020-02-25 ENCOUNTER — Ambulatory Visit (HOSPITAL_COMMUNITY)
Admission: RE | Admit: 2020-02-25 | Discharge: 2020-02-25 | Disposition: A | Discharge status: Home / Self Care | Payer: Medicare Other | Source: Ambulatory Visit | Attending: Hematology and Oncology | Admitting: Hematology and Oncology

## 2020-02-25 ENCOUNTER — Inpatient Hospital Stay: Payer: Medicare Other | Attending: Hematology and Oncology

## 2020-02-25 ENCOUNTER — Encounter (HOSPITAL_COMMUNITY): Payer: Self-pay

## 2020-02-25 DIAGNOSIS — R1310 Dysphagia, unspecified: Secondary | ICD-10-CM | POA: Insufficient documentation

## 2020-02-25 DIAGNOSIS — I313 Pericardial effusion (noninflammatory): Secondary | ICD-10-CM | POA: Insufficient documentation

## 2020-02-25 DIAGNOSIS — E041 Nontoxic single thyroid nodule: Secondary | ICD-10-CM | POA: Insufficient documentation

## 2020-02-25 DIAGNOSIS — D569 Thalassemia, unspecified: Secondary | ICD-10-CM | POA: Insufficient documentation

## 2020-02-25 DIAGNOSIS — I700 Atherosclerosis of aorta: Secondary | ICD-10-CM | POA: Diagnosis not present

## 2020-02-25 DIAGNOSIS — J479 Bronchiectasis, uncomplicated: Secondary | ICD-10-CM | POA: Diagnosis not present

## 2020-02-25 DIAGNOSIS — Z79899 Other long term (current) drug therapy: Secondary | ICD-10-CM | POA: Diagnosis not present

## 2020-02-25 DIAGNOSIS — C01 Malignant neoplasm of base of tongue: Secondary | ICD-10-CM | POA: Insufficient documentation

## 2020-02-25 DIAGNOSIS — C7800 Secondary malignant neoplasm of unspecified lung: Secondary | ICD-10-CM | POA: Diagnosis not present

## 2020-02-25 DIAGNOSIS — C7802 Secondary malignant neoplasm of left lung: Secondary | ICD-10-CM | POA: Diagnosis present

## 2020-02-25 DIAGNOSIS — D638 Anemia in other chronic diseases classified elsewhere: Secondary | ICD-10-CM | POA: Insufficient documentation

## 2020-02-25 DIAGNOSIS — Z885 Allergy status to narcotic agent status: Secondary | ICD-10-CM | POA: Diagnosis not present

## 2020-02-25 HISTORY — DX: Malignant neoplasm of unspecified part of unspecified bronchus or lung: C34.90

## 2020-02-25 LAB — CBC WITH DIFFERENTIAL/PLATELET
Abs Immature Granulocytes: 0.02 10*3/uL (ref 0.00–0.07)
Basophils Absolute: 0.1 10*3/uL (ref 0.0–0.1)
Basophils Relative: 1 %
Eosinophils Absolute: 0.2 10*3/uL (ref 0.0–0.5)
Eosinophils Relative: 4 %
HCT: 33.9 % — ABNORMAL LOW (ref 36.0–46.0)
Hemoglobin: 10.5 g/dL — ABNORMAL LOW (ref 12.0–15.0)
Immature Granulocytes: 1 %
Lymphocytes Relative: 19 %
Lymphs Abs: 0.8 10*3/uL (ref 0.7–4.0)
MCH: 20.8 pg — ABNORMAL LOW (ref 26.0–34.0)
MCHC: 31 g/dL (ref 30.0–36.0)
MCV: 67 fL — ABNORMAL LOW (ref 80.0–100.0)
Monocytes Absolute: 0.5 10*3/uL (ref 0.1–1.0)
Monocytes Relative: 12 %
Neutro Abs: 2.8 10*3/uL (ref 1.7–7.7)
Neutrophils Relative %: 63 %
Platelets: 218 10*3/uL (ref 150–400)
RBC: 5.06 MIL/uL (ref 3.87–5.11)
RDW: 14.6 % (ref 11.5–15.5)
WBC: 4.4 10*3/uL (ref 4.0–10.5)
nRBC: 0 % (ref 0.0–0.2)

## 2020-02-25 LAB — COMPREHENSIVE METABOLIC PANEL
ALT: 13 U/L (ref 0–44)
AST: 19 U/L (ref 15–41)
Albumin: 4.1 g/dL (ref 3.5–5.0)
Alkaline Phosphatase: 76 U/L (ref 38–126)
Anion gap: 11 (ref 5–15)
BUN: 27 mg/dL — ABNORMAL HIGH (ref 8–23)
CO2: 25 mmol/L (ref 22–32)
Calcium: 9.5 mg/dL (ref 8.9–10.3)
Chloride: 105 mmol/L (ref 98–111)
Creatinine, Ser: 1.14 mg/dL — ABNORMAL HIGH (ref 0.44–1.00)
GFR calc Af Amer: 56 mL/min — ABNORMAL LOW (ref >60)
GFR calc non Af Amer: 48 mL/min — ABNORMAL LOW (ref >60)
Glucose, Bld: 96 mg/dL (ref 70–99)
Potassium: 3.9 mmol/L (ref 3.5–5.1)
Sodium: 141 mmol/L (ref 135–145)
Total Bilirubin: 0.7 mg/dL (ref 0.3–1.2)
Total Protein: 6.7 g/dL (ref 6.5–8.1)

## 2020-02-25 LAB — TSH: TSH: 3.681 u[IU]/mL (ref 0.350–4.500)

## 2020-02-26 ENCOUNTER — Other Ambulatory Visit: Payer: Medicare Other

## 2020-02-27 ENCOUNTER — Encounter: Payer: Self-pay | Admitting: Hematology and Oncology

## 2020-02-27 ENCOUNTER — Other Ambulatory Visit: Payer: Medicare Other

## 2020-02-27 ENCOUNTER — Other Ambulatory Visit: Payer: Self-pay

## 2020-02-27 ENCOUNTER — Inpatient Hospital Stay: Payer: Medicare Other | Admitting: Hematology and Oncology

## 2020-02-27 DIAGNOSIS — C01 Malignant neoplasm of base of tongue: Secondary | ICD-10-CM

## 2020-02-27 DIAGNOSIS — C7802 Secondary malignant neoplasm of left lung: Secondary | ICD-10-CM | POA: Diagnosis not present

## 2020-02-27 DIAGNOSIS — D568 Other thalassemias: Secondary | ICD-10-CM | POA: Diagnosis not present

## 2020-02-27 DIAGNOSIS — R1314 Dysphagia, pharyngoesophageal phase: Secondary | ICD-10-CM | POA: Diagnosis not present

## 2020-02-27 NOTE — Progress Notes (Signed)
[place] OFFICE PROGRESS NOTE  Patient Care Team: [name], MD as PCP - General (Hematology and Oncology) [name], MD as Consulting Physician (Hematology and Oncology) [name], MD as Attending Physician (Radiation Oncology) [name], RD as Dietitian (Nutrition)  ASSESSMENT & PLAN:  Cancer of base of tongue ([place]) Clinically, she has no signs or symptoms of cancer recurrence I discussed the importance of ENT follow-up I will see her back in a year  Metastasis to lung [facility]) CT imaging of her chest has been stable and the size of the nodule in the left lower lung is stable I will monitor that clinically from now on with annual non contrast CT imaging of the chest  Thalassemia This is likely anemia of chronic disease with background of thalassemia. The patient denies recent history of bleeding such as epistaxis, hematuria or hematochezia. She is asymptomatic from the anemia. We will observe for now.     Dysphagia The patient has severe dysphagia but overall, she is able to maintain her weight She will continue liquid diet as tolerated.   Orders Placed This Encounter  Procedures  . CT CHEST WO CONTRAST    Standing Status:   Future    Standing Expiration Date:   02/26/2021    Order Specific Question:   Preferred imaging location?    Answer:   [facility]    Order Specific Question:   Radiology Contrast Protocol - do NOT remove file path    Answer:   [email or web address]  . CBC with Differential/Platelet    Standing Status:   Future    Standing Expiration Date:   02/26/2021  . Comprehensive metabolic panel    Standing Status:   Future    Standing Expiration Date:   02/26/2021  . TSH    Standing Status:   Future    Standing Expiration Date:   02/26/2021    All questions were answered. The patient knows to call the clinic with any problems, questions or concerns. The total time spent in the appointment was 20 minutes  encounter with patients including review of chart and various tests results, discussions about plan of care and coordination of care plan   [name], MD 02/27/2020 5:52 PM  INTERVAL HISTORY: Please see below for problem oriented charting. She returns for further follow-up for head and neck cancer She is doing well She continues to have chronic dysphagia but she is able to maintain her weight Appetite is good No new oral lesions  SUMMARY OF ONCOLOGIC HISTORY: Oncology History Overview Note  Cancer of base of tongue ([place])   Staging form: Lip and Oral Cavity, AJCC 7th Edition     Clinical stage from 10/14/2015: Stage IVA (T2, N2b, M0) - Signed by [name], MD on 10/19/2015     Cancer of base of tongue ([place])  09/29/2015 Pathology Results   Accession: [id number] FNA right neck showed necrotic cells suspicious for squamous cell cancer   09/29/2015 Pathology Results   Accession: [id number] Mouth biopsy showed pyogenic granuloma   10/08/2015 Imaging   CT neck showed 1. 3 cm right tongue base mass consistent with malignancy.2. Necrotic right level II and III nodal metastases.3. Enlarged right thyroid lobe containing multiple nodules.   10/14/2015 Imaging   PET scan showed 1. Hypermetabolic right tongue base mass with hypermetabolic right level II and III adenopathy. Small left level III lymph node appears mildly hypermetabolic as well.2. A few scattered tiny pulmonary nodules are too small for  PETresolution   10/16/2015 Imaging   [place] Head/Neck:  1) Multiple thyroid lesions bilaterally; dominant nodule in right lower pole measures 22 mm and meets fine-needle aspiration criteria. 2) Complex mass within the soft tissues of the right side of neck worrisome for metastatic malignancy.   10/26/2015 Pathology Results   Accession [id number]:  Thyroid FNA - Consistent with benign follicular nodule.   10/28/2015 Surgery   Multiple tooth extractions (26), right tongue base biopsy.   10/28/2015 Pathology Results    Accession [id number]:  Tongue, right base -  Infiltrative SCC, p16 positive.  Actinomyces infection.   11/05/2015 Procedure   Port-a-cath and PEG placed.   11/11/2015 - 01/05/2016 Radiation Therapy   Site/dose:   Base of tongue and bilateral neck / 70 Gy in 35 fractions to gross disease, 63 Gy in 35 fractions to high risk nodal echelons, and 56 Gy in 35 fractions to intermediate risk nodal echelons Beams/energy:   Helical IMRT / 6 MV photons   11/11/2015 - 11/12/2015 Chemotherapy   She received high dose cisplatin. Treatment was discontinued permanently due to severe side-effects   11/13/2015 Adverse Reaction   She presented with intractable nausea and vomiting   11/13/2015 - 11/30/2015 Hospital Admission   She was hospitalized for intractable nausea, vomiting and developed acute renal failure, reversed with aggressive IV fluid hydration. Subsequently, she was found to have intra-abdominal infection with MRSA requiring placement of drainage tube and IV ABx   11/24/2015 Imaging   CT scan showed large abdominal abscess   11/25/2015 Procedure   She had placement of drain   12/08/2015 Procedure   Intra-abdominal drain was removed   12/08/2015 Imaging   CT abdomen showed resolution of abscess and a new left lower base lung nodules   12/31/2015 Imaging   Enlarging left lower lobe pulmonary nodule. This could represent a hematogenous metastasis although the rate of enlargement is faster than typical.   02/16/2016 Imaging   Pulmonary nodule in the left lower lobe has increased in size from 12/08/2015. This is a new finding when compared with 10/13/2014. Findings are worrisome for progression of pulmonary metastasis   03/14/2016 - 03/23/2016 Radiation Therapy   Site/dose:  Left lower lung / 60 Gy in 5 fractions.  Beams/energy:   SBRT-3D / 6FFF   10/07/2016 Imaging   CT neck showed stable exam compared to 07/07/2016, including the persistently enlarged right jugulodigastric node which was characterized  on PET-CT 04/05/2016. No evidence of new or progressive disease   10/07/2016 Imaging   Continued decrease in size of the left lower lobe pulmonary nodule which now measures 5 x 5 mm and is surrounded by radiation changes. 2. Stable small right upper lobe pulmonary nodules. No new pulmonary lesions. 3. Stable biapical pleural and parenchymal scarring changes. 4. No mediastinal or hilar mass or adenopathy. 5. Stable multinodular right thyroid goiter. 6. No findings for upper abdominal metastatic disease. Stable left adrenal gland adenoma.   12/29/2016 Imaging   Barium swallow shows a long segment of narrowing in the cervical esophagus which would not pass a 13 mm tablet. Also reflux    01/05/2017 Imaging   Ct chest: Radiation change in the left lower lobe is similar. The previously described 5 mm pulmonary nodule is difficult to differentiate from surrounding radiation change. 2. Similar right-sided pulmonary nodules. No enlarging or suspicious pulmonary nodule identified. 3. Coronary artery atherosclerosis. Aortic atherosclerosis.   06/19/2017 Procedure   Successful exchange of the gastrostomy tube. The patient has a 66 [place]  balloon retention catheter.   10/09/2017 Imaging   1. No evidence of lung cancer metastasis. 2. 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 Recommend continued surveillance. 3. Stable apical scarring. 4. Nodule liver suggests cirrhosis   12/21/2017 Procedure   Successful exchange of existing gastrostomy tube(Foley) for new 20 french balloon retention gastrostomy tube   03/19/2018 Imaging   1. Interval increase in nodular area of consolidation within the subpleural left lower lobe which may represent evolving post treatment/radiation changes. Localized recurrence at this level is not entirely excluded. 2. Stable biapical pleuroparenchymal scarring. 3. Aortic Atherosclerosis (ICD10-I70.0).   03/27/2018 PET  scan   1. Triangular band of density in the left lower lobe at the site of the prior hypermetabolic pulmonary nodule is currently about 2.1. This is probably related to radiation fibrosis. Surveillance is likely warranted to exclude enlargement. 2. No recurrence in the neck. 3. Other imaging findings of potential clinical significance: Aortoiliac atherosclerotic vascular disease. Cholelithiasis. Right thyroid nodule is not hypermetabolic and is likely benign.   07/02/2018 Procedure   Successful bedside exchange of a new 20-French gastrostomy tube with contrast injection confirming appropriate position functionality. The new gastrostomy tube is ready for immediate use.   07/30/2018 Imaging   1. No findings suspicious for recurrent metastatic disease in the chest. 2. 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 3. Mild cylindrical bronchiectasis with associated patchy mucoid impaction in the right middle lobe and lingula, slightly worsened. 4. Stable heterogeneous 3.0 cm posterior right thyroid lobe nodule.  Aortic Atherosclerosis (ICD10-I70.0).   02/27/2019 Imaging   1. Nodular consolidation in the left lower lobe is unchanged as are scattered tiny bilateral pulmonary nodules. 2. Heterogeneous right thyroid nodule, as before. Consider further evaluation with thyroid ultrasound. If patient is clinically hyperthyroid, consider nuclear medicine thyroid uptake and scan. 3.  Aortic atherosclerosis (ICD10-170.0).   02/25/2020 Imaging   1. No evidence of lung cancer recurrence. 2. Post radiation change in the upper lobes. 3. Stable thyroid nodule. 4.  Aortic Atherosclerosis (ICD10-I70.0).     REVIEW OF SYSTEMS:   Constitutional: Denies fevers, chills or abnormal weight loss Eyes: Denies blurriness of vision Ears, nose, mouth, throat, and face: Denies mucositis or sore throat Respiratory: Denies cough, dyspnea or wheezes Cardiovascular: Denies palpitation,  chest discomfort or lower extremity swelling Gastrointestinal:  Denies nausea, heartburn or change in bowel habits Skin: Denies abnormal skin rashes Lymphatics: Denies new lymphadenopathy or easy bruising Neurological:Denies numbness, tingling or new weaknesses Behavioral/Psych: Mood is stable, no new changes  All other systems were reviewed with the patient and are negative.  I have reviewed the past medical history, past surgical history, social history and family history with the patient and they are unchanged from previous note.  ALLERGIES:  is allergic to chlorhexidine gluconate and morphine and related.  MEDICATIONS:  No current outpatient medications on file.   No current facility-administered medications for this visit.    PHYSICAL EXAMINATION: ECOG PERFORMANCE STATUS: 0 - Asymptomatic  Vitals:   02/27/20 0924  BP: (!) 168/77  Pulse: 74  Resp: 16  Temp: 98.3 F (36.8 C)  SpO2: 100%   Filed Weights   02/27/20 0924  Weight: 133 lb 3.2 oz (60.4 kg)    GENERAL:alert, no distress and comfortable NEURO: alert & oriented x 3 with fluent speech, no focal motor/sensory deficits  LABORATORY DATA:  I have reviewed the data as listed    Component  Value Date/Time   NA 141 02/25/2020 0817   NA 139 04/06/2017 0744   K 3.9 02/25/2020 0817   K 4.3 04/06/2017 0744   CL 105 02/25/2020 0817   CO2 25 02/25/2020 0817   CO2 24 04/06/2017 0744   GLUCOSE 96 02/25/2020 0817   GLUCOSE 90 04/06/2017 0744   BUN 27 (H) 02/25/2020 0817   BUN 44.1 (H) 04/06/2017 0744   CREATININE 1.14 (H) 02/25/2020 0817   CREATININE 1.03 (H) 02/27/2019 0820   CREATININE 1.1 04/06/2017 0744   CALCIUM 9.5 02/25/2020 0817   CALCIUM 10.1 04/06/2017 0744   PROT 6.7 02/25/2020 0817   PROT 6.9 04/06/2017 0744   ALBUMIN 4.1 02/25/2020 0817   ALBUMIN 3.9 04/06/2017 0744   AST 19 02/25/2020 0817   AST 21 02/27/2019 0820   AST 25 04/06/2017 0744   ALT 13 02/25/2020 0817   ALT 13 02/27/2019 0820   ALT  19 04/06/2017 0744   ALKPHOS 76 02/25/2020 0817   ALKPHOS 112 04/06/2017 0744   BILITOT 0.7 02/25/2020 0817   BILITOT 0.4 02/27/2019 0820   BILITOT 0.63 04/06/2017 0744   GFRNONAA 48 (L) 02/25/2020 0817   GFRNONAA 55 (L) 02/27/2019 0820   GFRAA 56 (L) 02/25/2020 0817   GFRAA >60 02/27/2019 0820    No results found for: SPEP, UPEP  Lab Results  Component Value Date   WBC 4.4 02/25/2020   NEUTROABS 2.8 02/25/2020   HGB 10.5 (L) 02/25/2020   HCT 33.9 (L) 02/25/2020   MCV 67.0 (L) 02/25/2020   PLT 218 02/25/2020      Chemistry      Component Value Date/Time   NA 141 02/25/2020 0817   NA 139 04/06/2017 0744   K 3.9 02/25/2020 0817   K 4.3 04/06/2017 0744   CL 105 02/25/2020 0817   CO2 25 02/25/2020 0817   CO2 24 04/06/2017 0744   BUN 27 (H) 02/25/2020 0817   BUN 44.1 (H) 04/06/2017 0744   CREATININE 1.14 (H) 02/25/2020 0817   CREATININE 1.03 (H) 02/27/2019 0820   CREATININE 1.1 04/06/2017 0744      Component Value Date/Time   CALCIUM 9.5 02/25/2020 0817   CALCIUM 10.1 04/06/2017 0744   ALKPHOS 76 02/25/2020 0817   ALKPHOS 112 04/06/2017 0744   AST 19 02/25/2020 0817   AST 21 02/27/2019 0820   AST 25 04/06/2017 0744   ALT 13 02/25/2020 0817   ALT 13 02/27/2019 0820   ALT 19 04/06/2017 0744   BILITOT 0.7 02/25/2020 0817   BILITOT 0.4 02/27/2019 0820   BILITOT 0.63 04/06/2017 0744       RADIOGRAPHIC STUDIES: I have reviewed multiple imaging studies with the patient I have personally reviewed the radiological images as listed and agreed with the findings in the report. CT Chest Wo Contrast  Result Date: 02/25/2020 CLINICAL DATA:  F/u pt with tongue CA dx'd in 09/2015 Chemo and RT completed 04/2016 Hx of lung CA dx'd 01/2016 XRT comp 8/2017Malignant neoplasm of tongue EXAM: CT CHEST WITHOUT CONTRAST TECHNIQUE: Multidetector CT imaging of the chest was performed following the standard protocol without IV contrast. COMPARISON:  Chest CT 02/27/2019 FINDINGS:  Cardiovascular: No significant vascular findings. Normal heart size. No pericardial effusion. Mediastinum/Nodes: No axillary supraclavicular adenopathy. Enlarged nodule in the LEFT lobe of thyroid gland measures 2.5 cm unchanged from comparison exam. No mediastinal hilar nodes. Pericardial effusion. Esophagus Lungs/Pleura: There is biapical pleuroparenchymal scarring with consolidation and bronchiectasis not changed from comparison exam. Band of atelectasis  in the LEFT lower lobe is unchanged. No new or suspicious nodularity. Upper Abdomen: Limited view of the liver, kidneys, pancreas are unremarkable. Normal adrenal glands. Musculoskeletal: No aggressive osseous lesion. IMPRESSION: 1. No evidence of lung cancer recurrence. 2. Post radiation change in the upper lobes. 3. Stable thyroid nodule. 4.  Aortic Atherosclerosis (ICD10-I70.0). Electronically Signed   By: Suzy Bouchard M.D.   On: 02/25/2020 11:05

## 2020-02-27 NOTE — Assessment & Plan Note (Signed)
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 The patient denies recent history of bleeding such as epistaxis, hematuria or hematochezia. She is asymptomatic from the anemia. We will observe for now.

## 2020-02-27 NOTE — Assessment & Plan Note (Signed)
The patient has severe dysphagia but overall, she is able to maintain her weight She will continue liquid diet as tolerated.

## 2020-02-27 NOTE — Assessment & Plan Note (Signed)
CT imaging of her chest has been stable and the size of the nodule in the left lower lung is stable I will monitor that clinically from now on with annual non contrast CT imaging of the chest

## 2020-02-27 NOTE — Assessment & Plan Note (Signed)
Clinically, she has no signs or symptoms of cancer recurrence I discussed the importance of ENT follow-up I will see her back in a year

## 2021-01-03 ENCOUNTER — Other Ambulatory Visit: Payer: Self-pay

## 2021-01-03 ENCOUNTER — Ambulatory Visit (HOSPITAL_COMMUNITY)
Admission: EM | Admit: 2021-01-03 | Discharge: 2021-01-03 | Disposition: A | Discharge status: Home / Self Care | Payer: Medicare Other | Attending: Student | Admitting: Student

## 2021-01-03 DIAGNOSIS — L237 Allergic contact dermatitis due to plants, except food: Secondary | ICD-10-CM

## 2021-01-03 MED ORDER — PREDNISONE 10 MG (21) PO TBPK
ORAL_TABLET | Freq: Every day | ORAL | 0 refills | Status: DC
Start: 2021-01-03 — End: 2021-02-23

## 2021-01-03 NOTE — Discharge Instructions (Addendum)
-  Start the prednisone taper for your poison ivy. -You can continue over-the-counter creams and Benadryl for symptomatic relief. -Seek additional medical attention if your symptoms worsen, like facial swelling, vision changes, shortness of breath.

## 2021-01-03 NOTE — ED Triage Notes (Signed)
Pt reports  She worked in her yard on Thursday and now has redness to skin around RT eye due to poison ivy.

## 2021-01-03 NOTE — ED Provider Notes (Signed)
[place]    CSN: [id number] Arrival date & time: 01/03/21  1114      History   Chief Complaint Chief Complaint  Patient presents with  . Eye Problem    HPI Debra Farrell is a 75 y.o. female presenting with poison ivy on face surrounding eye following yardwork 4 days ago.  Medical history cancer, hypertension, hyperlipidemia, thalassemia minor.  States she was working in her yard 4 days ago and now has redness to the skin around her right eye. Denies vision changes, eye pain. Also with few patches on right forearm. Describes this as very itchy. Has tried multiple OTC creams and medications without relief. Has required prednisone taper multiple times for poison ivy in the past.  HPI  Past Medical History:  Diagnosis Date  . Abdominal abscess 11/30/2015  . Anemia in chronic illness 12/16/2015  . Cancer of base of tongue ([place]) 10/14/2015  . Eczema   . GERD (gastroesophageal reflux disease)   . Hx of radiation therapy 11/11/15- 01/05/2016   Base of Tongue and bilateral neck  . Hx of radiation therapy 03/14/16- 03/23/16   Left Lower Lung  . Hyperlipidemia   . Hypertension    no meds  . Infection with methicillin-resistant Staphylococcus aureus (MRSA) 11/30/2015  . Intractable nausea and vomiting 11/13/2015  . Laceration 04/2014   around R ear, for falling out of bed & hitting nightstand  . Lung cancer ([place]) dx'd 01/2016  . Nausea without vomiting 12/16/2015  . Thalassemia minor   . Throat pain in adult 12/29/2015    Patient Active Problem List   Diagnosis Date Noted  . Thyroid nodule 02/28/2019  . Port-A-Cath in place 09/10/2018  . Weight loss, abnormal 03/20/2018  . Hypothyroidism (acquired) 03/19/2018  . Other fatigue 04/07/2017  . 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 Granulation tissue of skin 09/02/2016  . Pancytopenia, acquired ([place]) 09/02/2016  . Preventive measure 09/02/2016  . Lymphedema in adult patient 05/13/2016  . Malignant neoplasm of lower lobe of  left lung ([place]) 02/17/2016  . Malnutrition of moderate degree 01/04/2016  . Goals of care, counseling/discussion   . Dysphagia 12/31/2015  . 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 Encounter for palliative care   . Odynophagia   . Throat pain in adult 12/29/2015  . Nausea without vomiting 12/16/2015  . Anemia in chronic illness 12/16/2015  . Anemia due to other cause   . Dehydration   . Severe protein-calorie malnutrition ([facility]) 11/14/2015  . Intractable nausea and vomiting 11/13/2015  . Gastritis, acute 11/13/2015  . Cancer of base of tongue ([place]) 10/14/2015  . Skin lesion of back 03/21/2013  . Lipoma of axilla 03/21/2013  . Sleep disorder 03/17/2013  . Skin lesion 03/17/2013  . Health care maintenance 03/17/2013  . Right knee pain 04/05/2012  . Health maintenance examination 10/17/2011  . Thalassemia 10/17/2011  . Essential hypertension 10/17/2011  . Hyperlipidemia 10/17/2011  . Headache(784.0) 10/17/2011    Past Surgical History:  Procedure Laterality Date  . ABDOMINAL HYSTERECTOMY  1990   partial  . DIRECT LARYNGOSCOPY N/A 10/28/2015   Procedure: DIRECT LARYNGOSCOPY WITH BIOPSY;  Surgeon: [name], MD;  Location: [facility];  Service: ENT;  Laterality: N/A;  . DIRECT LARYNGOSCOPY N/A 03/13/2017   Procedure: DIRECT LARYNGOSCOPY;  Surgeon: [name], MD;  Location: [facility];  Service: ENT;  Laterality: N/A;  . DIRECT LARYNGOSCOPY N/A 01/18/2018   Procedure: DIRECT LARYNGOSCOPY;  Surgeon: [name], MD;  Location:  [facility];  Service: ENT;  Laterality: N/A;  . ESOPHAGOSCOPY N/A 10/28/2015   Procedure: ESOPHAGOSCOPY;  Surgeon: [name], MD;  Location: [facility];  Service: ENT;  Laterality: N/A;  . ESOPHAGOSCOPY WITH DILITATION N/A 03/13/2017   Procedure: ESOPHAGOSCOPY WITH DILITATION;  Surgeon: [name], MD;  Location: [facility];  Service: ENT;  Laterality: N/A;  . ESOPHAGOSCOPY WITH DILITATION N/A 01/18/2018   Procedure:  ESOPHAGOSCOPY WITH DILITATION POSSIBLE BOTOX;  Surgeon: [name], MD;  Location: [facility];  Service: ENT;  Laterality: N/A;  . [place], [phone]  . IR CV LINE INJECTION  06/28/2018  . IR CV LINE INJECTION  07/23/2018  . IR REMOVAL TUN ACCESS W/ PORT W/O FL MOD SED  03/21/2019  . IR REMOVE CV FIBRIN SHEATH  07/23/2018  . IR REPLACE G-TUBE SIMPLE WO FLUORO  06/19/2017  . IR REPLACE G-TUBE SIMPLE WO FLUORO  09/05/2017  . IR REPLACE G-TUBE SIMPLE WO FLUORO  12/21/2017  . IR REPLC GASTRO/COLONIC TUBE PERCUT W/FLUORO  07/02/2018  . IR US GUIDE VASC ACCESS RIGHT  07/23/2018  . LARYNGOSCOPY AND BRONCHOSCOPY N/A 10/28/2015   Procedure: BRONCHOSCOPY;  Surgeon: [name], MD;  Location: [facility];  Service: ENT;  Laterality: N/A;  . MULTIPLE EXTRACTIONS WITH ALVEOLOPLASTY N/A 10/28/2015   Procedure: Extraction of tooth #'s 2-12, 14,15,17,18,20-29, 31 with alveoloplasy and mandibular left torus reduction;  Surgeon: Lenn Cal, DDS;  Location: [facility];  Service: Oral Surgery;  Laterality: N/A;  . port-a-cath insertion    . RECTOCELE REPAIR    . TONSILLECTOMY     as a child  . urocele     correction surgery    OB History   No obstetric history on file.      Home Medications    Prior to Admission medications   Medication Sig Start Date End Date Taking? Authorizing Provider  predniSONE (STERAPRED UNI-PAK 21 TAB) 10 MG (21) TBPK tablet Take by mouth daily. Take 6 tabs by mouth daily  for 2 days, then 5 tabs for 2 days, then 4 tabs for 2 days, then 3 tabs for 2 days, 2 tabs for 2 days, then 1 tab by mouth daily for 2 days 01/03/21  Yes Hazel Sams, PA-C    Family History Family History  Problem Relation Age of Onset  . Cancer Mother        lung  . Asthma Father     Social History Social History   Tobacco Use  . Smoking status: Never Smoker  . Smokeless tobacco: Never Used  Vaping Use  . Vaping Use: Never used  Substance Use Topics  . Alcohol use: No  . Drug  use: No     Allergies   Chlorhexidine gluconate and Morphine and related   Review of Systems Review of Systems  Eyes: Positive for itching. Negative for photophobia, pain, discharge, redness and visual disturbance.  All other systems reviewed and are negative.    Physical Exam Triage Vital Signs ED Triage Vitals  Enc Vitals Group     BP 01/03/21 1208 (!) 146/82     Pulse Rate 01/03/21 1208 81     Resp 01/03/21 1208 16     Temp 01/03/21 1208 98.6 F (37 C)     Temp Source 01/03/21 1208 Oral     SpO2 01/03/21 1208 96 %     Weight --      Height --      Head Circumference --  Peak Flow --      Pain Score 01/03/21 1206 0     Pain Loc --      Pain Edu? --      Excl. in [place]? --    No data found.  Updated Vital Signs BP (!) 146/82 (BP Location: Right Arm)   Pulse 81   Temp 98.6 F (37 C) (Oral)   Resp 16   SpO2 96%   Visual Acuity Right Eye Distance: 20/40 Left Eye Distance: 20/40 Bilateral Distance: 20/40 with glasses  Right Eye Near:   Left Eye Near:    Bilateral Near:     Physical Exam Vitals reviewed.  Constitutional:      General: She is not in acute distress.    Appearance: Normal appearance. She is not ill-appearing or diaphoretic.  HENT:     Head: Normocephalic and atraumatic.     Comments: Absolutely no pharyngeal or facial swelling.  Eyes:     Comments: PERRLA, EOMI and without pain Absolutely no conjunctival injection, rashes, lesions. Vision grossly intact.  Erythematous rash surrounding eye, including upper eyelid.   Cardiovascular:     Rate and Rhythm: Normal rate and regular rhythm.     Heart sounds: Normal heart sounds.  Pulmonary:     Effort: Pulmonary effort is normal.     Breath sounds: Normal breath sounds.  Skin:    General: Skin is warm.     Comments: R forearm with few patches of erythematous rash  Neurological:     General: No focal deficit present.     Mental Status: She is alert and oriented to person, place, and time.   Psychiatric:        Mood and Affect: Mood normal.        Behavior: Behavior normal.        Thought Content: Thought content normal.        Judgment: Judgment normal.      UC Treatments / Results  Labs (all labs ordered are listed, but only abnormal results are displayed) Labs Reviewed - No data to display  EKG   Radiology No results found.  Procedures Procedures (including critical care time)  Medications Ordered in UC Medications - No data to display  Initial Impression / Assessment and Plan / UC Course  I have reviewed the triage vital signs and the nursing notes.  Pertinent labs & imaging results that were available during my care of the patient were reviewed by me and considered in my medical decision making (see chart for details).     This patient is a 75 year old female presenting with poison ivy.  Visual acuity intact. She is not a diabetic. Prednisone taper sent as below. ED return precautions discussed.  Final Clinical Impressions(s) / UC Diagnoses   Final diagnoses:  Poison ivy dermatitis     Discharge Instructions     -Start the prednisone taper for your poison ivy. -You can continue over-the-counter creams and Benadryl for symptomatic relief. -Seek additional medical attention if your symptoms worsen, like facial swelling, vision changes, shortness of breath.    ED Prescriptions    Medication Sig Dispense Auth. Provider   predniSONE (STERAPRED UNI-PAK 21 TAB) 10 MG (21) TBPK tablet Take by mouth daily. Take 6 tabs by mouth daily  for 2 days, then 5 tabs for 2 days, then 4 tabs for 2 days, then 3 tabs for 2 days, 2 tabs for 2 days, then 1 tab by mouth daily for 2 days 42 tablet  Hazel Sams, PA-C     PDMP not reviewed this encounter.   Hazel Sams, PA-C 01/03/21 1314

## 2021-02-18 ENCOUNTER — Other Ambulatory Visit: Payer: Self-pay

## 2021-02-18 ENCOUNTER — Ambulatory Visit (HOSPITAL_COMMUNITY)
Admission: RE | Admit: 2021-02-18 | Discharge: 2021-02-18 | Disposition: A | Discharge status: Home / Self Care | Payer: Medicare Other | Source: Ambulatory Visit | Attending: Hematology and Oncology | Admitting: Hematology and Oncology

## 2021-02-18 ENCOUNTER — Encounter (HOSPITAL_COMMUNITY): Payer: Self-pay

## 2021-02-18 ENCOUNTER — Inpatient Hospital Stay: Payer: Medicare Other | Attending: Hematology and Oncology

## 2021-02-18 DIAGNOSIS — E039 Hypothyroidism, unspecified: Secondary | ICD-10-CM | POA: Diagnosis not present

## 2021-02-18 DIAGNOSIS — J984 Other disorders of lung: Secondary | ICD-10-CM | POA: Insufficient documentation

## 2021-02-18 DIAGNOSIS — Z79899 Other long term (current) drug therapy: Secondary | ICD-10-CM | POA: Diagnosis not present

## 2021-02-18 DIAGNOSIS — Z885 Allergy status to narcotic agent status: Secondary | ICD-10-CM | POA: Insufficient documentation

## 2021-02-18 DIAGNOSIS — R918 Other nonspecific abnormal finding of lung field: Secondary | ICD-10-CM | POA: Diagnosis not present

## 2021-02-18 DIAGNOSIS — Z79890 Hormone replacement therapy (postmenopausal): Secondary | ICD-10-CM | POA: Diagnosis not present

## 2021-02-18 DIAGNOSIS — C01 Malignant neoplasm of base of tongue: Secondary | ICD-10-CM | POA: Diagnosis present

## 2021-02-18 DIAGNOSIS — E041 Nontoxic single thyroid nodule: Secondary | ICD-10-CM | POA: Diagnosis not present

## 2021-02-18 DIAGNOSIS — R1310 Dysphagia, unspecified: Secondary | ICD-10-CM | POA: Insufficient documentation

## 2021-02-18 DIAGNOSIS — Z923 Personal history of irradiation: Secondary | ICD-10-CM | POA: Insufficient documentation

## 2021-02-18 DIAGNOSIS — I700 Atherosclerosis of aorta: Secondary | ICD-10-CM | POA: Diagnosis not present

## 2021-02-18 DIAGNOSIS — C7802 Secondary malignant neoplasm of left lung: Secondary | ICD-10-CM | POA: Diagnosis present

## 2021-02-18 LAB — COMPREHENSIVE METABOLIC PANEL
ALT: 12 U/L (ref 0–44)
AST: 20 U/L (ref 15–41)
Albumin: 3.9 g/dL (ref 3.5–5.0)
Alkaline Phosphatase: 83 U/L (ref 38–126)
Anion gap: 10 (ref 5–15)
BUN: 22 mg/dL (ref 8–23)
CO2: 27 mmol/L (ref 22–32)
Calcium: 9.5 mg/dL (ref 8.9–10.3)
Chloride: 104 mmol/L (ref 98–111)
Creatinine, Ser: 1 mg/dL (ref 0.44–1.00)
GFR, Estimated: 59 mL/min — ABNORMAL LOW (ref >60)
Glucose, Bld: 90 mg/dL (ref 70–99)
Potassium: 4.2 mmol/L (ref 3.5–5.1)
Sodium: 141 mmol/L (ref 135–145)
Total Bilirubin: 0.7 mg/dL (ref 0.3–1.2)
Total Protein: 6.7 g/dL (ref 6.5–8.1)

## 2021-02-18 LAB — CBC WITH DIFFERENTIAL/PLATELET
Abs Immature Granulocytes: 0.02 10*3/uL (ref 0.00–0.07)
Basophils Absolute: 0.1 10*3/uL (ref 0.0–0.1)
Basophils Relative: 1 %
Eosinophils Absolute: 0.1 10*3/uL (ref 0.0–0.5)
Eosinophils Relative: 2 %
HCT: 33.1 % — ABNORMAL LOW (ref 36.0–46.0)
Hemoglobin: 10.1 g/dL — ABNORMAL LOW (ref 12.0–15.0)
Immature Granulocytes: 0 %
Lymphocytes Relative: 23 %
Lymphs Abs: 1.1 10*3/uL (ref 0.7–4.0)
MCH: 20 pg — ABNORMAL LOW (ref 26.0–34.0)
MCHC: 30.5 g/dL (ref 30.0–36.0)
MCV: 65.7 fL — ABNORMAL LOW (ref 80.0–100.0)
Monocytes Absolute: 0.6 10*3/uL (ref 0.1–1.0)
Monocytes Relative: 12 %
Neutro Abs: 3 10*3/uL (ref 1.7–7.7)
Neutrophils Relative %: 62 %
Platelets: 241 10*3/uL (ref 150–400)
RBC: 5.04 MIL/uL (ref 3.87–5.11)
RDW: 15.3 % (ref 11.5–15.5)
WBC: 4.9 10*3/uL (ref 4.0–10.5)
nRBC: 0 % (ref 0.0–0.2)

## 2021-02-18 LAB — TSH: TSH: 4.819 u[IU]/mL — ABNORMAL HIGH (ref 0.308–3.960)

## 2021-02-23 ENCOUNTER — Other Ambulatory Visit (HOSPITAL_COMMUNITY): Payer: Self-pay

## 2021-02-23 ENCOUNTER — Inpatient Hospital Stay: Payer: Medicare Other | Admitting: Hematology and Oncology

## 2021-02-23 ENCOUNTER — Other Ambulatory Visit: Payer: Self-pay

## 2021-02-23 DIAGNOSIS — C01 Malignant neoplasm of base of tongue: Secondary | ICD-10-CM | POA: Diagnosis not present

## 2021-02-23 DIAGNOSIS — R1314 Dysphagia, pharyngoesophageal phase: Secondary | ICD-10-CM

## 2021-02-23 DIAGNOSIS — E039 Hypothyroidism, unspecified: Secondary | ICD-10-CM | POA: Diagnosis not present

## 2021-02-23 MED ORDER — LEVOTHYROXINE SODIUM 25 MCG PO TABS
25.0000 ug | ORAL_TABLET | Freq: Every day | ORAL | 9 refills | Status: DC
  Filled 2021-02-23: qty 30, 30d supply, fill #0
  Filled 2021-03-26: qty 30, 30d supply, fill #1

## 2021-02-25 ENCOUNTER — Encounter: Payer: Self-pay | Admitting: Hematology and Oncology

## 2021-02-25 NOTE — Assessment & Plan Note (Signed)
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

## 2021-02-25 NOTE — Assessment & Plan Note (Signed)
She has chronic dysphagia since radiation treatment This is not likely to improve She wants another evaluation She can either see ENT or GI service for esophageal dilation She will discuss this with her primary care

## 2021-02-25 NOTE — Assessment & Plan Note (Signed)
I have reviewed multiple CT imaging with the patient She has no signs of recurrent cancer It has been more than 5 years since her last treatment She is considered a long-term cancer survivor I recommend discontinuation of follow-up here

## 2021-02-25 NOTE — Progress Notes (Signed)
Center OFFICE PROGRESS NOTE  Patient Care Team: [name], MD as PCP - General (Hematology and Oncology) [name], MD as Consulting Physician (Hematology and Oncology) [name], MD as Attending Physician (Radiation Oncology) [name], RD as Dietitian (Nutrition)  ASSESSMENT & PLAN:  Cancer of base of tongue ([place]) I have reviewed multiple CT imaging with the patient She has no signs of recurrent cancer It has been more than 5 years since her last treatment She is considered a long-term cancer survivor I recommend discontinuation of follow-up here  Dysphagia She has chronic dysphagia since radiation treatment This is not likely to improve She wants another evaluation She can either see ENT or GI service for esophageal dilation She will discuss this with her primary care  Acquired hypothyroidism 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   All questions were answered. The patient knows to call the clinic with any problems, questions or concerns. The total time spent in the appointment was 20 minutes encounter with patients including review of chart and various tests results, discussions about plan of care and coordination of care plan   [name], MD 02/25/2021 8:07 AM  INTERVAL HISTORY: Please see below for problem oriented charting. She returns for further follow-up She is doing well She continues to complain of dysphagia She has profound weight gain and feeling tired  SUMMARY OF ONCOLOGIC HISTORY: Oncology History Overview Note  Cancer of base of tongue ([place])   Staging form: Lip and Oral Cavity, AJCC 7th Edition     Clinical stage from 10/14/2015: Stage IVA (T2, N2b, M0) - Signed by [name], MD on 10/19/2015     Cancer  of base of tongue ([place])  09/29/2015 Pathology Results   Accession: [id number] FNA right neck showed necrotic cells suspicious for squamous cell cancer   09/29/2015 Pathology Results   Accession: [id number] Mouth biopsy showed pyogenic granuloma   10/08/2015 Imaging   CT neck showed 1. 3 cm right tongue base mass consistent with malignancy.2. Necrotic right level II and III nodal metastases.3. Enlarged right thyroid lobe containing multiple nodules.   10/14/2015 Imaging   PET scan showed 1. Hypermetabolic right tongue base mass with hypermetabolic right level II and III adenopathy. Small left level III lymph node appears mildly hypermetabolic as well.2. A few scattered tiny pulmonary nodules are too small for PETresolution   10/16/2015 Imaging   [place] Head/Neck:  1) Multiple thyroid lesions bilaterally; dominant nodule in right lower pole measures 22 mm and meets fine-needle aspiration criteria. 2) Complex mass within the soft tissues of the right side of neck worrisome for metastatic malignancy.   10/26/2015 Pathology Results   Accession [id number]:  Thyroid FNA - Consistent with benign follicular nodule.   10/28/2015 Surgery   Multiple tooth extractions (26), right tongue base biopsy.   10/28/2015 Pathology Results   Accession [id number]:  Tongue, right base -  Infiltrative SCC, p16 positive.  Actinomyces infection.   11/05/2015 Procedure   Port-a-cath and PEG placed.   11/11/2015 - 01/05/2016 Radiation Therapy   Site/dose:   Base of tongue and bilateral neck / 70 Gy in 35 fractions to gross disease, 63 Gy in 35 fractions to high risk nodal echelons, and 56 Gy in 35 fractions to intermediate  risk nodal echelons Beams/energy:   Helical IMRT / 6 MV photons   11/11/2015 - 11/12/2015 Chemotherapy   She received high dose cisplatin. Treatment was discontinued permanently due to severe side-effects   11/13/2015 Adverse Reaction   She presented with intractable nausea and vomiting   11/13/2015 - 11/30/2015 Hospital  Admission   She was hospitalized for intractable nausea, vomiting and developed acute renal failure, reversed with aggressive IV fluid hydration. Subsequently, she was found to have intra-abdominal infection with MRSA requiring placement of drainage tube and IV ABx   11/24/2015 Imaging   CT scan showed large abdominal abscess   11/25/2015 Procedure   She had placement of drain   12/08/2015 Procedure   Intra-abdominal drain was removed   12/08/2015 Imaging   CT abdomen showed resolution of abscess and a new left lower base lung nodules   12/31/2015 Imaging   Enlarging left lower lobe pulmonary nodule. This could represent a hematogenous metastasis although the rate of enlargement is faster than typical.   02/16/2016 Imaging   Pulmonary nodule in the left lower lobe has increased in size from 12/08/2015. This is a new finding when compared with 10/13/2014. Findings are worrisome for progression of pulmonary metastasis   03/14/2016 - 03/23/2016 Radiation Therapy   Site/dose:  Left lower lung / 60 Gy in 5 fractions.  Beams/energy:   SBRT-3D / 6FFF   10/07/2016 Imaging   CT neck showed stable exam compared to 07/07/2016, including the persistently enlarged right jugulodigastric node which was characterized on PET-CT 04/05/2016. No evidence of new or progressive disease   10/07/2016 Imaging   Continued decrease in size of the left lower lobe pulmonary nodule which now measures 5 x 5 mm and is surrounded by radiation changes. 2. Stable small right upper lobe pulmonary nodules. No new pulmonary lesions. 3. Stable biapical pleural and parenchymal scarring changes. 4. No mediastinal or hilar mass or adenopathy. 5. Stable multinodular right thyroid goiter. 6. No findings for upper abdominal metastatic disease. Stable left adrenal gland adenoma.   12/29/2016 Imaging   Barium swallow shows a long segment of narrowing in the cervical esophagus which would not pass a 13 mm tablet. Also reflux    01/05/2017  Imaging   Ct chest: Radiation change in the left lower lobe is similar. The previously described 5 mm pulmonary nodule is difficult to differentiate from surrounding radiation change. 2. Similar right-sided pulmonary nodules. No enlarging or suspicious pulmonary nodule identified. 3. Coronary artery atherosclerosis. Aortic atherosclerosis.   06/19/2017 Procedure   Successful exchange of the gastrostomy tube. The patient has a 27 [place] balloon retention catheter.   10/09/2017 Imaging   1. No evidence of lung cancer metastasis. 2. 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 Recommend continued surveillance. 3. Stable apical scarring. 4. Nodule liver suggests cirrhosis   12/21/2017 Procedure   Successful exchange of existing gastrostomy tube(Foley) for new 20 french balloon retention gastrostomy tube   03/19/2018 Imaging   1. Interval increase in nodular area of consolidation within the subpleural left lower lobe which may represent evolving post treatment/radiation changes. Localized recurrence at this level is not entirely excluded. 2. Stable biapical pleuroparenchymal scarring. 3. Aortic Atherosclerosis (ICD10-I70.0).   03/27/2018 PET scan   1. Triangular band of density in the left lower lobe at the site of the prior hypermetabolic pulmonary nodule is currently about 2.1. This is probably related to radiation fibrosis. Surveillance is likely warranted to exclude enlargement. 2. No recurrence  in the neck. 3. Other imaging findings of potential clinical significance: Aortoiliac atherosclerotic vascular disease. Cholelithiasis. Right thyroid nodule is not hypermetabolic and is likely benign.   07/02/2018 Procedure   Successful bedside exchange of a new 20-French gastrostomy tube with contrast injection confirming appropriate position functionality. The new gastrostomy tube is ready for immediate use.   07/30/2018 Imaging   1.  No findings suspicious for recurrent metastatic disease in the chest. 2. 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 3. Mild cylindrical bronchiectasis with associated patchy mucoid impaction in the right middle lobe and lingula, slightly worsened. 4. Stable heterogeneous 3.0 cm posterior right thyroid lobe nodule.  Aortic Atherosclerosis (ICD10-I70.0).   02/27/2019 Imaging   1. Nodular consolidation in the left lower lobe is unchanged as are scattered tiny bilateral pulmonary nodules. 2. Heterogeneous right thyroid nodule, as before. Consider further evaluation with thyroid ultrasound. If patient is clinically hyperthyroid, consider nuclear medicine thyroid uptake and scan. 3.  Aortic atherosclerosis (ICD10-170.0).   02/25/2020 Imaging   1. No evidence of lung cancer recurrence. 2. Post radiation change in the upper lobes. 3. Stable thyroid nodule. 4.  Aortic Atherosclerosis (ICD10-I70.0).   02/19/2021 Imaging   1. Stable exam. No new or progressive interval findings. 2. Stable tiny right lung nodules. 3. Stable 2.5 cm right thyroid nodule. 4. Aortic Atherosclerosis (ICD10-I70.0).         REVIEW OF SYSTEMS:   Constitutional: Denies fevers, chills or abnormal weight loss Eyes: Denies blurriness of vision Ears, nose, mouth, throat, and face: Denies mucositis or sore throat Respiratory: Denies cough, dyspnea or wheezes Cardiovascular: Denies palpitation, chest discomfort or lower extremity swelling Gastrointestinal:  Denies nausea, heartburn or change in bowel habits Skin: Denies abnormal skin rashes Lymphatics: Denies new lymphadenopathy or easy bruising Neurological:Denies numbness, tingling or new weaknesses Behavioral/Psych: Mood is stable, no new changes  All other systems were reviewed with the patient and are negative.  I have reviewed the past medical history, past surgical history, social history and family history with the patient and  they are unchanged from previous note.  ALLERGIES:  is allergic to chlorhexidine gluconate and morphine and related.  MEDICATIONS:  Current Outpatient Medications  Medication Sig Dispense Refill  . levothyroxine (SYNTHROID) 25 MCG tablet Take 1 tablet (25 mcg total) by mouth daily before breakfast. 30 tablet 9   No current facility-administered medications for this visit.    PHYSICAL EXAMINATION: ECOG PERFORMANCE STATUS: 0 - Asymptomatic  Vitals:   02/23/21 1342  BP: (!) 185/94  Pulse: 77  Resp: 18  SpO2: 99%   Filed Weights   02/23/21 1342  Weight: 143 lb 12.8 oz (65.2 kg)    GENERAL:alert, no distress and comfortable NEURO: alert & oriented x 3 with fluent speech, no focal motor/sensory deficits  LABORATORY DATA:  I have reviewed the data as listed    Component Value Date/Time   NA 141 02/18/2021 0735   NA 139 04/06/2017 0744   K 4.2 02/18/2021 0735   K 4.3 04/06/2017 0744   CL 104 02/18/2021 0735   CO2 27 02/18/2021 0735   CO2 24 04/06/2017 0744   GLUCOSE 90 02/18/2021 0735   GLUCOSE 90 04/06/2017 0744   BUN 22 02/18/2021 0735   BUN 44.1 (H) 04/06/2017 0744   CREATININE 1.00 02/18/2021 0735   CREATININE 1.03 (H) 02/27/2019 0820   CREATININE 1.1 04/06/2017 0744   CALCIUM 9.5 02/18/2021 0735   CALCIUM 10.1 04/06/2017 0744  PROT 6.7 02/18/2021 0735   PROT 6.9 04/06/2017 0744   ALBUMIN 3.9 02/18/2021 0735   ALBUMIN 3.9 04/06/2017 0744   AST 20 02/18/2021 0735   AST 21 02/27/2019 0820   AST 25 04/06/2017 0744   ALT 12 02/18/2021 0735   ALT 13 02/27/2019 0820   ALT 19 04/06/2017 0744   ALKPHOS 83 02/18/2021 0735   ALKPHOS 112 04/06/2017 0744   BILITOT 0.7 02/18/2021 0735   BILITOT 0.4 02/27/2019 0820   BILITOT 0.63 04/06/2017 0744   GFRNONAA 59 (L) 02/18/2021 0735   GFRNONAA 55 (L) 02/27/2019 0820   GFRAA 56 (L) 02/25/2020 0817   GFRAA >60 02/27/2019 0820    No results found for: SPEP, UPEP  Lab Results  Component Value Date   WBC 4.9  02/18/2021   NEUTROABS 3.0 02/18/2021   HGB 10.1 (L) 02/18/2021   HCT 33.1 (L) 02/18/2021   MCV 65.7 (L) 02/18/2021   PLT 241 02/18/2021      Chemistry      Component Value Date/Time   NA 141 02/18/2021 0735   NA 139 04/06/2017 0744   K 4.2 02/18/2021 0735   K 4.3 04/06/2017 0744   CL 104 02/18/2021 0735   CO2 27 02/18/2021 0735   CO2 24 04/06/2017 0744   BUN 22 02/18/2021 0735   BUN 44.1 (H) 04/06/2017 0744   CREATININE 1.00 02/18/2021 0735   CREATININE 1.03 (H) 02/27/2019 0820   CREATININE 1.1 04/06/2017 0744      Component Value Date/Time   CALCIUM 9.5 02/18/2021 0735   CALCIUM 10.1 04/06/2017 0744   ALKPHOS 83 02/18/2021 0735   ALKPHOS 112 04/06/2017 0744   AST 20 02/18/2021 0735   AST 21 02/27/2019 0820   AST 25 04/06/2017 0744   ALT 12 02/18/2021 0735   ALT 13 02/27/2019 0820   ALT 19 04/06/2017 0744   BILITOT 0.7 02/18/2021 0735   BILITOT 0.4 02/27/2019 0820   BILITOT 0.63 04/06/2017 0744       RADIOGRAPHIC STUDIES: I have reviewed multiple imaging with the patient I have personally reviewed the radiological images as listed and agreed with the findings in the report. CT CHEST WO CONTRAST  Result Date: 02/19/2021 CLINICAL DATA:  Head and neck cancer.  Restaging. EXAM: CT CHEST WITHOUT CONTRAST TECHNIQUE: Multidetector CT imaging of the chest was performed following the standard protocol without IV contrast. COMPARISON:  02/25/2020 FINDINGS: Cardiovascular: The heart size is normal. No substantial pericardial effusion. Coronary artery calcification is evident. Mediastinum/Nodes: 2.5 cm right thyroid nodule measured previously is 2.4 cm today. No mediastinal lymphadenopathy. No evidence for gross hilar lymphadenopathy although assessment is limited by the lack of intravenous contrast on today's study. The esophagus has normal imaging features. There is no axillary lymphadenopathy. Lungs/Pleura: Biapical pleuroparenchymal scarring is stable. Stable tiny posterior  right upper lobe nodule on 68/7. Another tiny perifissural right lower lobe nodule on 106/7 is unchanged. Scarring in the posterior left lower lobe is stable with overlying nonacute rib fractures. No new suspicious pulmonary nodule or mass. No focal airspace consolidation. There is no evidence of pleural effusion. Upper Abdomen: Unremarkable. Musculoskeletal: Old posterior left rib fractures again noted. No worrisome lytic or sclerotic osseous abnormality. IMPRESSION: 1. Stable exam. No new or progressive interval findings. 2. Stable tiny right lung nodules. 3. Stable 2.5 cm right thyroid nodule. 4. Aortic Atherosclerosis (ICD10-I70.0). Electronically Signed   By: Misty Stanley M.D.   On: 02/19/2021 08:33

## 2021-03-26 ENCOUNTER — Other Ambulatory Visit (HOSPITAL_COMMUNITY): Payer: Self-pay

## 2021-04-02 ENCOUNTER — Ambulatory Visit: Payer: Medicare Other | Admitting: Family Medicine

## 2021-04-04 NOTE — Progress Notes (Signed)
Subjective:    Patient ID: Debra Farrell, female    DOB: [birth year], 75 y.o.   MRN: [id number]   CC: Establish care  HPI:  Debra Farrell is a very pleasant 75 y.o. female who presents today to establish care.  Initial concerns: Notes that she frequently gets poison ivy as she works in the yard.  No poison ivy currently. Also notes history of eczema for which she uses cream, doing well with this.  Has ongoing problem swallowing, related to her radiation treatment.  This limits the food she can eat.  Would like referral to gastroenterology. Planning to make a Dermatology appointment as she also has a mass under her left axilla that she would like removed.   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   Past surgical history: Elective Hysterectomy 1990's, several bladder surgeries for bladder retention  Current medications: Synthroid 25 mcg   Family history: None  Social history: Retired Engineer, civil (consulting) from [employer]. Lives with grand-daughter. 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 Active, walks and does yard work. Diet includes vegetables. Never smoked. No alcohol or other illicit substances. Husband passed away in 2006-09-08.   ROS: pertinent noted in the HPI   Objective:  BP (!) 166/73   Pulse 78   Wt 143 lb 3.2 oz (65 kg)   SpO2 97%   BMI 23.11 kg/m   Vitals and nursing note reviewed  General: Awake, alert, oriented, in no acute distress, pleasant and cooperative with examination HEENT: Normocephalic, atraumatic, nares patent, dentition is good, oropharynx without erythema or exudates, TM's clear bilaterally, no thyroid nodules palpated Cardio: RRR without murmur, 2+ radial, DP and PT pulses b/l Respiratory: CTAB without wheezing/rhonchi/rales Abdomen: Soft, non-tender to palpation of all quadrants, non-distended, no  rebound/guarding, no organomegaly MSK: Able to move all extremities spontaneously, good muscle strength, no abnormalities Extremities: without edema or cyanosis Skin: Warm and dry. Has 4x4 soft, mobile mass under left axilla (picture below) Neuro: Speech is clear and intact, no focal deficits, no facial asymmetry, follows commands  Psych: Normal mood and affect      Assessment & Plan:    Subclinical hypothyroidism Started on Synthroid in May by her oncologist.  TSH was only mildly elevated at 4.8, T4 was normal.  Patient reports feeling decreased energy levels, however this could be age-related.  Otherwise, no symptoms.  We will recheck TSH today to ensure she is not hyperthyroid.  Could consider discontinuing Synthroid.  Dysphagia Likely related to radiation exposure.  Gastroenterology referral placed today for endoscopy and possible dilation if necessary.  Reassuringly weight has been stable but patient has difficulty with certain foods and her diet has been very limited as a result.   Lipoma Under left axilla. Soft, mobile and non-tender. Not growing. Would like removal by Dermatology. Patient states she will call for appointment, declines formal referral.   Cancer of base of tongue ([facility]) With history of metastasis to left lower lobe of lung. S/p radiation treatment. Now in remission and has been d/c from oncology. Congratulated patient on this. She is very grateful. Unfortunately still dealing with some side-effects from radiation such as dysphagia but otherwise is doing well!  Healthcare maintenance Reports she had normal Cologuard last year.  Not up-to-date on mammograms.  Given sheet to contact for appointment.  Cholesterols not been checked since Sep 08, 2012 per chart review.  Will check lipid panel today.  BMI 23, recent CMP without hyperglycemia, will defer hemoglobin A1c.  Discussed diet and exercise recommendations.  Elevated blood pressure reading At this time, unclear if  whitecoat hypertension or primary hypertension.  She has a home BP cuff, reports normotensive pressures at home with systolics in 120s.  States that she has an old prescription for Norvasc 5 mg that she takes as needed.  Reports that her blood pressures are only elevated at doctor's visits.  Recommended ambulatory blood pressure monitoring, scheduled with Dr. Raymondo Band on 8/2. Will hold medication for now. Asymptomatic.     Sabino Dick, DO Family Medicine Resident

## 2021-04-04 NOTE — Patient Instructions (Signed)
It was wonderful to meet you today.  Please bring ALL of your medications with you to every visit.   Today we talked about:  You have an appointment with our clinical pharmacist, Dr. Valentina Lucks for ambulatory blood pressure monitoring on 8/2 at 8:30 AM. This will give [place] a better idea of your blood pressures and whether we need to start you on medication.   Today at your annual preventive visit we talked about the following measures I recommend 150 minutes of exercise per week-try 30 minutes 5 days per week We discussed reducing sugary beverages (like soda and juice) and increasing leafy greens and whole fruits.  We discussed avoiding tobacco and alcohol.  I recommend avoiding illicit substances.  Your blood pressure is elevated today at 166/73.   Thank you for choosing [facility].   Please call [phone] with any questions about today's appointment.  Please be sure to schedule follow up at the front  desk before you leave today.   Sharion Settler, DO PGY-2 Family Medicine

## 2021-04-05 ENCOUNTER — Other Ambulatory Visit: Payer: Self-pay

## 2021-04-05 ENCOUNTER — Ambulatory Visit (INDEPENDENT_AMBULATORY_CARE_PROVIDER_SITE_OTHER): Payer: Medicare Other | Admitting: Family Medicine

## 2021-04-05 ENCOUNTER — Encounter: Payer: Self-pay | Admitting: Family Medicine

## 2021-04-05 ENCOUNTER — Ambulatory Visit (INDEPENDENT_AMBULATORY_CARE_PROVIDER_SITE_OTHER): Payer: Medicare Other

## 2021-04-05 VITALS — BP 166/73 | HR 78 | Wt 143.2 lb

## 2021-04-05 DIAGNOSIS — Z23 Encounter for immunization: Secondary | ICD-10-CM

## 2021-04-05 DIAGNOSIS — Z0000 Encounter for general adult medical examination without abnormal findings: Secondary | ICD-10-CM | POA: Insufficient documentation

## 2021-04-05 DIAGNOSIS — R1314 Dysphagia, pharyngoesophageal phase: Secondary | ICD-10-CM | POA: Diagnosis not present

## 2021-04-05 DIAGNOSIS — E039 Hypothyroidism, unspecified: Secondary | ICD-10-CM | POA: Diagnosis not present

## 2021-04-05 DIAGNOSIS — C01 Malignant neoplasm of base of tongue: Secondary | ICD-10-CM

## 2021-04-05 DIAGNOSIS — E038 Other specified hypothyroidism: Secondary | ICD-10-CM | POA: Insufficient documentation

## 2021-04-05 DIAGNOSIS — D171 Benign lipomatous neoplasm of skin and subcutaneous tissue of trunk: Secondary | ICD-10-CM

## 2021-04-05 DIAGNOSIS — R030 Elevated blood-pressure reading, without diagnosis of hypertension: Secondary | ICD-10-CM | POA: Insufficient documentation

## 2021-04-05 DIAGNOSIS — Z13220 Encounter for screening for lipoid disorders: Secondary | ICD-10-CM | POA: Diagnosis not present

## 2021-04-05 DIAGNOSIS — D179 Benign lipomatous neoplasm, unspecified: Secondary | ICD-10-CM | POA: Insufficient documentation

## 2021-04-05 NOTE — Assessment & Plan Note (Signed)
Reports she had normal Cologuard last year.  Not up-to-date on mammograms.  Given sheet to contact for appointment.  Cholesterols not been checked since 2013 per chart review.  Will check lipid panel today.  BMI 23, recent CMP without hyperglycemia, will defer hemoglobin A1c.  Discussed diet and exercise recommendations.

## 2021-04-05 NOTE — Assessment & Plan Note (Signed)
With history of metastasis to left lower lobe of lung. S/p radiation treatment. Now in remission and has been d/c from oncology. Congratulated patient on this. She is very grateful. Unfortunately still dealing with some side-effects from radiation such as dysphagia but otherwise is doing well!

## 2021-04-05 NOTE — Assessment & Plan Note (Signed)
Started on Synthroid in May by her oncologist.  TSH was only mildly elevated at 4.8, T4 was normal.  Patient reports feeling decreased energy levels, however this could be age-related.  Otherwise, no symptoms.  We will recheck TSH today to ensure she is not hyperthyroid.  Could consider discontinuing Synthroid.

## 2021-04-05 NOTE — Assessment & Plan Note (Signed)
Likely related to radiation exposure.  Gastroenterology referral placed today for endoscopy and possible dilation if necessary.  Reassuringly weight has been stable but patient has difficulty with certain foods and her diet has been very limited as a result.

## 2021-04-05 NOTE — Assessment & Plan Note (Signed)
At this time, unclear if whitecoat hypertension or primary hypertension.  She has a home BP cuff, reports normotensive pressures at home with systolics in 546E.  States that she has an old prescription for Norvasc 5 mg that she takes as needed.  Reports that her blood pressures are only elevated at doctor's visits.  Recommended ambulatory blood pressure monitoring, scheduled with Dr. Valentina Lucks on 8/2. Will hold medication for now. Asymptomatic.

## 2021-04-05 NOTE — Assessment & Plan Note (Signed)
Under left axilla. Soft, mobile and non-tender. Not growing. Would like removal by Dermatology. Patient states she will call for appointment, declines formal referral.

## 2021-04-06 ENCOUNTER — Other Ambulatory Visit: Payer: Self-pay | Admitting: Family Medicine

## 2021-04-06 LAB — TSH: TSH: 3.04 u[IU]/mL (ref 0.450–4.500)

## 2021-04-06 LAB — LIPID PANEL
Chol/HDL Ratio: 2.5 ratio (ref 0.0–4.4)
Cholesterol, Total: 167 mg/dL (ref 100–199)
HDL: 66 mg/dL (ref >39)
LDL Chol Calc (NIH): 86 mg/dL (ref 0–99)
Triglycerides: 79 mg/dL (ref 0–149)
VLDL Cholesterol Cal: 15 mg/dL (ref 5–40)

## 2021-04-09 ENCOUNTER — Other Ambulatory Visit: Payer: Self-pay | Admitting: Family Medicine

## 2021-04-09 DIAGNOSIS — Z1231 Encounter for screening mammogram for malignant neoplasm of breast: Secondary | ICD-10-CM

## 2021-04-10 ENCOUNTER — Ambulatory Visit
Admission: RE | Admit: 2021-04-10 | Discharge: 2021-04-10 | Disposition: A | Discharge status: Home / Self Care | Payer: Medicare Other | Source: Ambulatory Visit | Attending: Family Medicine | Admitting: Family Medicine

## 2021-04-10 ENCOUNTER — Other Ambulatory Visit: Payer: Self-pay

## 2021-04-10 DIAGNOSIS — Z1231 Encounter for screening mammogram for malignant neoplasm of breast: Secondary | ICD-10-CM

## 2021-04-27 ENCOUNTER — Ambulatory Visit (INDEPENDENT_AMBULATORY_CARE_PROVIDER_SITE_OTHER): Payer: Medicare Other | Admitting: Pharmacist

## 2021-04-27 ENCOUNTER — Encounter: Payer: Self-pay | Admitting: Pharmacist

## 2021-04-27 ENCOUNTER — Other Ambulatory Visit: Payer: Self-pay

## 2021-04-27 DIAGNOSIS — I10 Essential (primary) hypertension: Secondary | ICD-10-CM | POA: Diagnosis not present

## 2021-04-27 NOTE — Patient Instructions (Signed)
Blood Pressure Activity Diary Time Lying down/ Sleeping Walking/ Exercise Stressed/ Angry Headache/ Pain Dizzy  9 AM       10 AM       11 AM       12 PM       1 PM       2 PM       Time Lying down/ Sleeping Walking/ Exercise Stressed/ Angry Headache/ Pain Dizzy  3 PM       4 PM        5 PM       6 PM       7 PM       8 PM       Time Lying down/ Sleeping Walking/ Exercise Stressed/ Angry Headache/ Pain Dizzy  9 PM       10 PM       11 PM       12 AM       1 AM       2 AM       3 AM       Time Lying down/ Sleeping Walking/ Exercise Stressed/ Angry Headache/ Pain Dizzy  4 AM       5 AM       6 AM       7 AM       8 AM       9 AM       10 AM        Time you woke up: _________                  Time you went to sleep:__________  Come back tomorrow at 9:00 to have the monitor removed Call the [facility] if you have any questions before then ([phone])  Wearing the Blood Pressure Monitor The cuff will inflate every 20 minutes during the day and every 30 minutes while you sleep. Your blood pressure readings will NOT display after cuff inflation Fill out the blood pressure-activity diary during the day, especially during activities that may affect your reading -- such as exercise, stress, walking, taking your blood pressure medications  Important things to know: Avoid taking the monitor off for the next 24 hours, unless it causes you discomfort or pain. Do NOT get the monitor wet and do NOT dry to clean the monitor with any cleaning products. Do NOT put the monitor on anyone else's arm. When the cuff inflates, avoid excess movement. Let the cuffed arm hang loosely, slightly away from the body. Avoid flexing the muscles or moving the hand/fingers. When you go to sleep, make sure that the hose is not kinked. Remember to fill out the blood pressure activity diary. If you experience severe pain or unusual pain (not associated with getting your blood pressure  checked), remove the monitor.  Troubleshooting:  Code  Troubleshooting   1  Check cuff position, tighten cuff   2, 3  Remain still during reading   4, 87  Check air hose connections and make sure cuff is tight   85, 89  Check hose connections and make tubing is not crimped   86  Push START/STOP to restart reading   88, 91  Retry by pushing START/STOP   90  Replace batteries. If problem persists, remove monitor and bring back to   clinic at follow up   97, 98, 99  Service required - Remove monitor and bring back to clinic at  follow up

## 2021-04-27 NOTE — Progress Notes (Signed)
S:    Patient arrives well appearing and ambulating without assistance.  Presents to the clinic for ambulatory blood pressure evaluation.   Patient was referred and last seen by Primary Care Provider, Dr. Nita Sells on 04/05/2021.   Intermittent elevated BP readings in office.  Home readings reported to be normal or low.  Patient reports readings as low as ~95/60.  Takes few medications and reports compliance to be excellent. She is not taking any BP medications currently.   Discussed procedure for wearing the monitor and gave patient written instructions. Monitor was placed on non-dominant arm with instructions to return in the morning.   Current BP Medications include:  None - however has been prescribed amlodipine earlier this year.    O:  Physical Exam Constitutional:      Appearance: Normal appearance. She is normal weight.  Pulmonary:     Effort: Pulmonary effort is normal.  Neurological:     Mental Status: She is alert.  Psychiatric:        Mood and Affect: Mood normal.        Behavior: Behavior normal.        Thought Content: Thought content normal.    Review of Systems  Gastrointestinal:        Dysphagia   Last 3 Office BP readings: BP Readings from Last 3 Encounters:  04/05/21 (!) 166/73  02/23/21 (!) 185/94  01/03/21 (!) 440/10    Basic Metabolic Panel    Component Value Date/Time   NA 141 02/18/2021 0735   NA 139 04/06/2017 0744   K 4.2 02/18/2021 0735   K 4.3 04/06/2017 0744   CL 104 02/18/2021 0735   CO2 27 02/18/2021 0735   CO2 24 04/06/2017 0744   GLUCOSE 90 02/18/2021 0735   GLUCOSE 90 04/06/2017 0744   BUN 22 02/18/2021 0735   BUN 44.1 (H) 04/06/2017 0744   CREATININE 1.00 02/18/2021 0735   CREATININE 1.03 (H) 02/27/2019 0820   CREATININE 1.1 04/06/2017 0744   CALCIUM 9.5 02/18/2021 0735   CALCIUM 10.1 04/06/2017 0744   GFRNONAA 59 (L) 02/18/2021 0735   GFRNONAA 55 (L) 02/27/2019 0820   GFRAA 56 (L) 02/25/2020 0817   GFRAA >60 02/27/2019  0820    ABPM Study Data: Arm Placement left arm  Overall Mean 24hr BP:   161/81 mmHg HR: 72  Daytime Mean BP:  153/76 mmHg HR: 75  Nighttime Mean BP:  185/93 mmHg HR: 66  Dipping Pattern: No.  Sys:  Increaesd by 20.8%    Dia: Increased by 21.5%   [normal dipping ~10-20%]  Non-hypertensive ABPM thresholds: daytime BP <125/75 mmHg, sleeptime BP <120/70 mmHg    In OFFICE automated BP 04/28/2021 = 123/80  In Office assessment with home BP monitor.  147/72  A/P: History of elevated blood pressures in office on multiple occasions.  Evaluated with amubulatory Blood Pressure monitor and found to have elevated readings.  Patient reports pain with evaluation and states she did not enjoy the evaluation as it prevented her from sleeping and disrupted her schedule.  She believed that everything it did caused elevation in her blood pressures. Given 24-hour ambulatory blood pressure demonstrates elevated readings during a stressful period (recent banking issues) while also worrying about the control. Average daytime blood pressure of 153/76 mmHg, is concerning.  Following both automated BP cuff in office which resulted in a normal reading and her home monitor which registered as a higher readings we agreed to continue to follow home blood pressure  readings with home monitor - at a variety of times for the next 4-6 weeks then return for additional support and discussion with PCP, Dr. Nita Sells.    Results reviewed and written information provided.  Total time in face-to-face counseling 26 minutes.   F/U Clinic Visit with Dr. Nita Sells.  Patient seen with Meyer Russel, PharmD Candidate, and Joseph Art, PharmD - PGY-1 Resident.

## 2021-04-28 NOTE — Assessment & Plan Note (Signed)
History of elevated blood pressures in office on multiple occasions.  Evaluated with amubulatory Blood Pressure monitor and found to have elevated readings.  Patient reports pain with evaluation and states she did not enjoy the evaluation as it prevented her from sleeping and disrupted her schedule.  She believed that everything it did caused elevation in her blood pressures. Given 24-hour ambulatory blood pressure demonstrates elevated readings during a stressful period (recent banking issues) while also worrying about the control. Average daytime blood pressure of 153/76 mmHg, is concerning.  Following both automated BP cuff in office which resulted in a normal reading and her home monitor which registered as a higher readings we agreed to continue to follow home blood pressure readings with home monitor - at a variety of times for the next 4-6 weeks then return for additional support and discussion with PCP, Debra Farrell.

## 2021-04-29 NOTE — Progress Notes (Signed)
Reviewed: I agree with Dr. Koval's documentation and management. 

## 2021-07-30 ENCOUNTER — Encounter: Payer: Self-pay | Admitting: Physician Assistant

## 2021-08-18 ENCOUNTER — Encounter: Payer: Self-pay | Admitting: Physician Assistant

## 2021-08-18 ENCOUNTER — Ambulatory Visit: Payer: Medicare Other | Admitting: Physician Assistant

## 2021-08-18 VITALS — BP 154/90 | HR 72 | Ht 66.0 in | Wt 141.0 lb

## 2021-08-18 DIAGNOSIS — R1314 Dysphagia, pharyngoesophageal phase: Secondary | ICD-10-CM

## 2021-08-18 DIAGNOSIS — Z8589 Personal history of malignant neoplasm of other organs and systems: Secondary | ICD-10-CM | POA: Diagnosis not present

## 2021-08-18 DIAGNOSIS — K222 Esophageal obstruction: Secondary | ICD-10-CM

## 2021-08-18 NOTE — Patient Instructions (Signed)
If you are age 73 or older, your body mass index should be between 23-30. Your Body mass index is 22.76 kg/m. If this is out of the aforementioned range listed, please consider follow up with your Primary Care Provider. ________________________________________________________  The [facility] providers would like to encourage you to use [facility] to communicate with providers for non-urgent requests or questions.  Due to long hold times on the telephone, sending your provider a message by [facility] may be a faster and more efficient way to get a response.  Please allow 48 business hours for a response.  Please remember that this is for non-urgent requests.  _______________________________________________________  Debra Farrell have been scheduled for a Barium Esophogram at [facility] (1st floor of the hospital) on 09/02/2021 at 9:30 am. Please arrive 15 minutes prior to your appointment for registration. Make certain not to have anything to eat or drink 3 hours prior to your test. If you need to reschedule for any reason, please contact radiology at [phone] to do so. __________________________________________________________________ A barium swallow is an examination that concentrates on views of the esophagus. This tends to be a double contrast exam (barium and two liquids which, when combined, create a gas to distend the wall of the oesophagus) or single contrast (non-ionic iodine based). The study is usually tailored to your symptoms so a good history is essential. Attention is paid during the study to the form, structure and configuration of the esophagus, looking for functional disorders (such as aspiration, dysphagia, achalasia, motility and reflux) EXAMINATION You may be asked to change into a gown, depending on the type of swallow being performed. A radiologist and radiographer will perform the procedure. The radiologist will advise you of the type of contrast selected for your procedure and  direct you during the exam. You will be asked to stand, sit or lie in several different positions and to hold a small amount of fluid in your mouth before being asked to swallow while the imaging is performed .In some instances you may be asked to swallow barium coated marshmallows to assess the motility of a solid food bolus. The exam can be recorded as a digital or video fluoroscopy procedure. POST PROCEDURE It will take 1-2 days for the barium to pass through your system. To facilitate this, it is important, unless otherwise directed, to increase your fluids for the next 24-48hrs and to resume your normal diet.  This test typically takes about 30 minutes to perform. ____________________________________________________________  Continue eating soft foods, and avoid meat.  Follow up pending the results of your Barium Swallow.  Thank you for entrusting me with your care and choosing [facility].  Amy Esterwood, PA-C

## 2021-08-18 NOTE — Progress Notes (Signed)
Subjective:    Patient ID: Debra Farrell, female    DOB: 1946-01-13, 75 y.o.   MRN: [id number]  HPI Debra Farrell is a 75 year old white female, new to GI today referred by Sabino Dick DO, with complaints of chronic dysphagia. 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  She completed treatment with chemotherapy and radiation.  She is followed by Dr. Bertis Ruddy, and has had no evidence of recurrence.  She was nutritionally supported with a PEG tube during treatment.  She developed dysphagia while undergoing radiation, and that has been a chronic problem ever since. She had previously been cared for by Dr. Shelba Flake. 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  She had undergone exam under anesthesia with Dr. Lazarus Salines with esophagoscopy and laryngoscopy with dilation and Botox injection of the cricopharyngeus.  I believe this procedure was also repeated in 2019.  Per Dr. Raye Sorrow note she did have improvement in symptoms, but patient tells me today that she never really noticed any significant improvement with those procedures.  She does not feel that her symptoms have worsened over the past couple years but rather have been persistent. She is able to swallow liquids without difficulty, can swallow only very small pills and will have significant episodes of choking with most meats.  She is still trying to eat meat but says she avoids beef hamburger [place] etc.  If she has an episode of dysphagia it usually requires regurgitation and points to her neck as the area where she feels food stopping.  She has no complaints of heartburn or indigestion.  No odynophagia.  Weight has been stable.  She would like to be able to swallow better, both to be able to take medications and to broaden her diet.  She has not had prior colonoscopy but did do a Cologuard in July 2022 which was  negative.  Other medical problems include hypertension, hypothyroidism, hyperlipidemia   Review of Systems Pertinent positive and negative review of systems were noted in the above HPI section.  All other review of systems was otherwise negative.   Outpatient Encounter Medications as of 08/18/2021  Medication Sig   Cholecalciferol (VITAMIN D3) 125 MCG (5000 UT) CHEW Chew by mouth.   Ibuprofen 200 MG CAPS Take by mouth.   Multiple Vitamins-Minerals (MULTIVITAMIN GUMMIES ADULT PO) Take by mouth.   No facility-administered encounter medications on file as of 08/18/2021.   Allergies  Allergen Reactions   Chlorhexidine Gluconate Itching    CHG SOAP    Morphine And Related Nausea And Vomiting   Patient Active Problem List   Diagnosis Date Noted   Subclinical hypothyroidism 04/05/2021   Lipoma 04/05/2021   Healthcare maintenance 04/05/2021   Elevated blood pressure reading 04/05/2021   Thyroid nodule 02/28/2019   Acquired hypothyroidism 03/19/2018   Metastasis to lung (HCC) 09/02/2016   Pancytopenia, acquired (HCC) 09/02/2016   Preventive measure 09/02/2016   Lymphedema in adult patient 05/13/2016   Malignant neoplasm of lower lobe of left lung (HCC) 02/17/2016   Malnutrition of moderate degree 01/04/2016   Goals of care, counseling/discussion    Dysphagia 12/31/2015   Mucositis due to radiation therapy 12/31/2015   Odynophagia    Throat pain in adult 12/29/2015   Anemia in chronic illness 12/16/2015   Intractable nausea and vomiting 11/13/2015   Gastritis, acute 11/13/2015   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  Lipoma of axilla 03/21/2013   Sleep disorder 03/17/2013   Health care maintenance 03/17/2013   Health maintenance examination 10/17/2011   Thalassemia 10/17/2011   Essential hypertension 10/17/2011   Hyperlipidemia 10/17/2011   Headache(784.0) 10/17/2011   Social History   Socioeconomic History   Marital status: Widowed    Spouse name: Not on file    Number of children: 3   Years of education: Not on file   Highest education level: Not on file  Occupational History    Employer: [employer]   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 3 daughters. 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    Moved to [facility] 2005.  RN at [employer].   Enjoys biking and walking.    [name]- Grandaughter- age 71- Has lived with Tameyah since birth.       Social Determinants of Health   Financial Resource Strain: Not on file  Food Insecurity: Not on file  Transportation Needs: Not on file  Physical Activity: Not on file  Stress: Not on file  Social Connections: Not on file  Intimate Partner Violence: Not on file    Debra Farrell family history includes Asthma in her father; Lung cancer in her mother.      Objective:    Vitals:   08/18/21 0837  BP: (!) 154/90  Pulse: 72    Physical Exam Well-developed well-nourished older white female in no acute distress.  Height, Weight, 141 BMI 22.7  HEENT; nontraumatic normocephalic, EOMI, PE R LA, sclera anicteric. Oropharynx; not examined today Neck; supple, no JVD, no adenopathy Cardiovascular; regular rate and rhythm with S1-S2, no murmur rub or gallop Pulmonary; Clear bilaterally Abdomen; soft, nontender, nondistended, no palpable mass or hepatosplenomegaly, bowel sounds are active Rectal; not done today Skin; benign exam, no jaundice rash or appreciable lesions Extremities; no clubbing cyanosis or edema skin warm and dry Neuro/Psych; alert and oriented x4, grossly nonfocal mood and affect appropriate        Assessment & Plan:   #87 75 year old white female with history of squamous cell  cancer of the tongue diagnosed 2017 stage IVa with metastatic disease to the lung.  Patient completed chemotherapy and radiation and has not had any evidence of recurrence. She developed dysphagia with radiation, and has had chronic complaints of dysphagia since. She did require nutritional support via PEG tube while she was undergoing treatment. She has been able to maintain her weight without difficulty over the past couple of years. She has had prior evaluation per Dr. Shelba Flake who made her initial diagnosis, and did undergo prior barium swallows and then laryngoscopy/esophagoscopy and esophageal dilation with Botox injection  to the cricopharyngeus on 2 occasions per Dr. Lazarus Salines, the last was in 2019. Dr. Lazarus Salines has retired and patient has not had any evaluation for the ongoing dysphagia since.  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  Hypothyroidism #4.  Hyperlipidemia #5.  Colon cancer screening-negative Cologuard July 22  Plan; patient will be scheduled for a barium swallow with tablet as initial evaluation. She  will be established with Dr. Meridee Score.  Will coordinate EGD,/dilation and setting based on barium swallow findings.   Jayesh Marbach S Ariabella Brien PA-C 08/18/2021   Cc: Sabino Dick, DO

## 2021-08-19 NOTE — Progress Notes (Signed)
Attending Physician's Attestation   I have reviewed the chart.   I agree with the Advanced Practitioner's note, impression, and recommendations with any updates as below. We will proceed with barium swallow at which point EGD with dilation will likely be scheduled thereafter.  Justice Britain, MD [facility] Advanced Endoscopy Office # [phone]

## 2021-09-01 ENCOUNTER — Other Ambulatory Visit (HOSPITAL_COMMUNITY): Payer: Self-pay

## 2021-09-01 MED ORDER — DOXYCYCLINE HYCLATE 100 MG PO CAPS
100.0000 mg | ORAL_CAPSULE | Freq: Two times a day (BID) | ORAL | 0 refills | Status: DC
  Filled 2021-09-01: qty 10, 5d supply, fill #0

## 2021-09-02 ENCOUNTER — Other Ambulatory Visit: Payer: Self-pay

## 2021-09-02 ENCOUNTER — Other Ambulatory Visit: Payer: Self-pay | Admitting: Physician Assistant

## 2021-09-02 ENCOUNTER — Ambulatory Visit (HOSPITAL_COMMUNITY)
Admission: RE | Admit: 2021-09-02 | Discharge: 2021-09-02 | Disposition: A | Discharge status: Home / Self Care | Payer: Medicare Other | Source: Ambulatory Visit | Attending: Physician Assistant | Admitting: Physician Assistant

## 2021-09-02 DIAGNOSIS — K222 Esophageal obstruction: Secondary | ICD-10-CM | POA: Insufficient documentation

## 2021-09-02 DIAGNOSIS — R1314 Dysphagia, pharyngoesophageal phase: Secondary | ICD-10-CM | POA: Insufficient documentation

## 2021-09-07 ENCOUNTER — Other Ambulatory Visit: Payer: Self-pay

## 2021-09-07 DIAGNOSIS — R933 Abnormal findings on diagnostic imaging of other parts of digestive tract: Secondary | ICD-10-CM

## 2021-09-07 DIAGNOSIS — R1310 Dysphagia, unspecified: Secondary | ICD-10-CM

## 2021-09-14 ENCOUNTER — Other Ambulatory Visit (HOSPITAL_COMMUNITY): Payer: Self-pay

## 2021-09-14 DIAGNOSIS — R1310 Dysphagia, unspecified: Secondary | ICD-10-CM

## 2021-09-29 ENCOUNTER — Other Ambulatory Visit: Payer: Self-pay

## 2021-09-29 ENCOUNTER — Ambulatory Visit (HOSPITAL_COMMUNITY)
Admission: RE | Admit: 2021-09-29 | Discharge: 2021-09-29 | Disposition: A | Discharge status: Home / Self Care | Payer: Medicare Other | Source: Ambulatory Visit | Attending: Physician Assistant | Admitting: Physician Assistant

## 2021-09-29 DIAGNOSIS — R933 Abnormal findings on diagnostic imaging of other parts of digestive tract: Secondary | ICD-10-CM | POA: Insufficient documentation

## 2021-09-29 DIAGNOSIS — R1310 Dysphagia, unspecified: Secondary | ICD-10-CM | POA: Diagnosis not present

## 2021-10-05 ENCOUNTER — Other Ambulatory Visit: Payer: Self-pay

## 2021-10-05 ENCOUNTER — Telehealth: Payer: Self-pay

## 2021-10-05 DIAGNOSIS — R1310 Dysphagia, unspecified: Secondary | ICD-10-CM

## 2021-10-05 DIAGNOSIS — R933 Abnormal findings on diagnostic imaging of other parts of digestive tract: Secondary | ICD-10-CM

## 2021-10-05 NOTE — Telephone Encounter (Signed)
Called patient to schedule EGD w/dilation, but mail box is full. Called daughter and she will have patient call back

## 2021-10-05 NOTE — Telephone Encounter (Signed)
-----   Message from Alfredia Ferguson, PA-C sent at 10/05/2021  9:50 AM EST ----- Regarding: FW: EGD Ok -please get pt scheduled for EGD / prob dilation  in the [place] with Dr Rush Landmark  if she wishes to proceed ----- Message ----- From: Irving Copas., MD Sent: 10/01/2021   4:35 PM EST To: Alfredia Ferguson, PA-C, Greggory Keen, LPN Subject: RE: EGD                                        AE, I have never done cricopharyngeal botox, unless there is manometry that shows she has this disorder.  I do not think I would do this unless we did a dilation and then she still had persisting symptoms. OK for [facility] if she is a candidate for anesthesia there. Thanks. GM ----- Message ----- From: Alfredia Ferguson, PA-C Sent: 10/01/2021   3:15 PM EST To: Greggory Keen, LPN, # Subject: EGD                                            Please call pt and let her know that her Barium swallow and speech pth eval results have been reviewed, and discussed with Dr Rush Landmark. He is willing to do an EGD with possible dilation for her to see if may improve her swallowing . If she is wil ing to proceed lets get her scheduled .  Dr Rush Landmark -her ENT had been using Botox in the past - would you want her set up at hospital in case you want to use Botox?      ----- Message ----- From: Irving Copas., MD Sent: 09/30/2021   3:32 PM EST To: Alfredia Ferguson, PA-C  AE, I think the patient symptoms still seem reasonable enough based on her age that a full upper endoscopy should be considered if she is a candidate for anesthesia.  An empiric dilation is not unreasonable either as she was not able to truly complete the entire barium esophagram.  Let me know what she decides. Thanks. GM ----- Message ----- From: Leotis Pain Sent: 09/30/2021  10:31 AM EST To: Irving Copas., MD  Please review Ba swallow and speech path assessment - Im not sure EGD / dilation would be of benefit to her ,  but want your input as you would be doing procedure- thanks

## 2021-10-19 ENCOUNTER — Other Ambulatory Visit: Payer: Self-pay

## 2021-10-19 ENCOUNTER — Encounter: Payer: Self-pay | Admitting: Gastroenterology

## 2021-10-19 ENCOUNTER — Ambulatory Visit (AMBULATORY_SURGERY_CENTER): Payer: Medicare Other | Admitting: Gastroenterology

## 2021-10-19 ENCOUNTER — Other Ambulatory Visit (HOSPITAL_COMMUNITY): Payer: Self-pay

## 2021-10-19 VITALS — BP 159/87 | HR 78 | Temp 98.2°F | Resp 15 | Ht 66.0 in | Wt 141.0 lb

## 2021-10-19 DIAGNOSIS — R1310 Dysphagia, unspecified: Secondary | ICD-10-CM

## 2021-10-19 DIAGNOSIS — Z8589 Personal history of malignant neoplasm of other organs and systems: Secondary | ICD-10-CM | POA: Diagnosis not present

## 2021-10-19 DIAGNOSIS — K2970 Gastritis, unspecified, without bleeding: Secondary | ICD-10-CM

## 2021-10-19 DIAGNOSIS — K2950 Unspecified chronic gastritis without bleeding: Secondary | ICD-10-CM | POA: Diagnosis not present

## 2021-10-19 DIAGNOSIS — K222 Esophageal obstruction: Secondary | ICD-10-CM

## 2021-10-19 DIAGNOSIS — K229 Disease of esophagus, unspecified: Secondary | ICD-10-CM | POA: Diagnosis not present

## 2021-10-19 MED ORDER — OMEPRAZOLE 20 MG PO CPDR
20.0000 mg | DELAYED_RELEASE_CAPSULE | Freq: Every day | ORAL | 6 refills | Status: DC
  Filled 2021-10-19: qty 30, 30d supply, fill #0

## 2021-10-19 MED ORDER — OMEPRAZOLE 2 MG/ML ORAL SUSPENSION
40.0000 mg | Freq: Every day | ORAL | 6 refills | Status: DC
  Filled 2021-10-19: qty 300, 15d supply, fill #0

## 2021-10-19 MED ORDER — SODIUM CHLORIDE 0.9 % IV SOLN: 500.0000 mL | Freq: Once | INTRAVENOUS | Status: DC

## 2021-10-19 NOTE — Progress Notes (Signed)
GASTROENTEROLOGY PROCEDURE H&P NOTE   Primary Care Physician: Sharion Settler, DO  HPI: Debra Farrell is a 76 y.o. female who presents for EGD for evaluation of dysphagia and history of previous oropharyngeal dysphagia and prior ENT botox to cricopharyngeus with prior history of SCC of the tongue years ago.  Past Medical History:  Diagnosis Date   Abdominal abscess 11/30/2015   Anemia in chronic illness 12/16/2015   Cancer of base of tongue ([place]) 10/14/2015   Eczema    GERD (gastroesophageal reflux disease)    Hx of radiation therapy 11/11/15- 01/05/2016   Base of Tongue and bilateral neck   Hx of radiation therapy 03/14/16- 03/23/16   Left Lower Lung   Hyperlipidemia    Hypertension    no meds   Infection with methicillin-resistant Staphylococcus aureus (MRSA) 11/30/2015   Intractable nausea and vomiting 11/13/2015   Laceration 04/2014   around R ear, for falling out of bed & hitting nightstand   Lung cancer ([place]) dx'd 01/2016   Nausea without vomiting 12/16/2015   Status post dilation of esophageal narrowing    Thalassemia minor    Throat cancer ([place])    Throat pain in adult 12/29/2015   Past Surgical History:  Procedure Laterality Date   ABDOMINAL HYSTERECTOMY  1990   partial   DIRECT LARYNGOSCOPY N/A 10/28/2015   Procedure: DIRECT LARYNGOSCOPY WITH BIOPSY;  Surgeon: [name], MD;  Location: [facility];  Service: ENT;  Laterality: N/A;   DIRECT LARYNGOSCOPY N/A 03/13/2017   Procedure: DIRECT LARYNGOSCOPY;  Surgeon: [name], MD;  Location: [place];  Service: ENT;  Laterality: N/A;   DIRECT LARYNGOSCOPY N/A 01/18/2018   Procedure: DIRECT LARYNGOSCOPY;  Surgeon: [name], MD;  Location: [place];  Service: ENT;  Laterality: N/A;   ESOPHAGOSCOPY N/A 10/28/2015   Procedure: ESOPHAGOSCOPY;  Surgeon: [name], MD;  Location: [place];  Service: ENT;  Laterality: N/A;   ESOPHAGOSCOPY WITH DILITATION N/A 03/13/2017   Procedure: ESOPHAGOSCOPY  WITH DILITATION;  Surgeon: [name], MD;  Location: [place];  Service: ENT;  Laterality: N/A;   ESOPHAGOSCOPY WITH DILITATION N/A 01/18/2018   Procedure: ESOPHAGOSCOPY WITH DILITATION POSSIBLE BOTOX;  Surgeon: [name], MD;  Location: [place];  Service: ENT;  Laterality: N/A;   [place], [phone]   IR CV LINE INJECTION  06/28/2018   IR CV LINE INJECTION  07/23/2018   IR REMOVAL TUN ACCESS W/ PORT W/O FL MOD SED  03/21/2019   IR REMOVE CV FIBRIN SHEATH  07/23/2018   IR REPLACE G-TUBE SIMPLE WO FLUORO  06/19/2017   IR REPLACE G-TUBE SIMPLE WO FLUORO  09/05/2017   IR REPLACE G-TUBE SIMPLE WO FLUORO  12/21/2017   IR [place] GASTRO/COLONIC TUBE PERCUT W/FLUORO  07/02/2018   IR US GUIDE VASC ACCESS RIGHT  07/23/2018   LARYNGOSCOPY AND BRONCHOSCOPY N/A 10/28/2015   Procedure: BRONCHOSCOPY;  Surgeon: [name], MD;  Location: [place];  Service: ENT;  Laterality: N/A;   MULTIPLE EXTRACTIONS WITH ALVEOLOPLASTY N/A 10/28/2015   Procedure: Extraction of tooth #'s 2-12, 14,15,17,18,20-29, 31 with alveoloplasy and mandibular left torus reduction;  Surgeon: Lenn Cal, DDS;  Location: [place];  Service: Oral Surgery;  Laterality: N/A;   port-a-cath insertion     RECTOCELE REPAIR     TONSILLECTOMY     as a child   UPPER GASTROINTESTINAL ENDOSCOPY     urocele     correction surgery   Current Outpatient Medications  Medication  Sig Dispense Refill   Cholecalciferol (VITAMIN D3) 125 MCG (5000 UT) CHEW Chew by mouth.     Multiple Vitamins-Minerals (MULTIVITAMIN GUMMIES ADULT PO) Take by mouth.     Ibuprofen 200 MG CAPS Take by mouth.     Current Facility-Administered Medications  Medication Dose Route Frequency Provider Last Rate Last Admin   0.9 %  sodium chloride infusion  500 mL Intravenous Once Mansouraty, Telford Nab., MD        Current Outpatient Medications:    Cholecalciferol (VITAMIN D3) 125 MCG (5000 UT) CHEW, Chew by mouth., Disp: , Rfl:     Multiple Vitamins-Minerals (MULTIVITAMIN GUMMIES ADULT PO), Take by mouth., Disp: , Rfl:    Ibuprofen 200 MG CAPS, Take by mouth., Disp: , Rfl:   Current Facility-Administered Medications:    0.9 %  sodium chloride infusion, 500 mL, Intravenous, Once, Mansouraty, Telford Nab., MD Allergies  Allergen Reactions   Chlorhexidine Gluconate Itching    CHG SOAP    Morphine And Related Nausea And Vomiting   Family History  Problem Relation Age of Onset   Lung cancer Mother    Asthma Father    Colon cancer Neg Hx    Esophageal cancer Neg Hx    Rectal cancer Neg Hx    Stomach cancer Neg Hx    Social History   Socioeconomic History   Marital status: Widowed    Spouse name: Not on file   Number of children: 3   Years of education: Not on file   Highest education level: Not on file  Occupational History    Employer: [employer]   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 3 daughters. 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    Moved to [place] 2005.  RN at [employer].   Enjoys biking and walking.    Leeanne Mannan- Grandaughter- age 73- Has lived with [place] since birth.       Social Determinants of Health   Financial Resource Strain: Not on file  Food Insecurity: Not on file  Transportation Needs: Not on file  Physical Activity: Not on file  Stress: Not on file  Social Connections: Not on file  Intimate Partner Violence: Not on file    Physical Exam: Today's Vitals   10/19/21 0742  BP: (!) 164/98  Pulse: 96  Temp: 98.2 F (36.8 C)  TempSrc: Temporal  SpO2: 99%  Weight: 141 lb (64 kg)  Height: 5\' 6"  (1.676 m)   Body mass index is 22.76 kg/m. GEN:  NAD EYE: Sclerae anicteric ENT: MMM CV: Non-tachycardic GI: Soft, NT/ND NEURO:  Alert & Oriented x 3  Lab Results: No results for input(s): WBC, HGB, HCT, PLT in the last 72 hours. BMET No results for input(s): NA, K, CL, CO2, GLUCOSE, BUN, CREATININE, CALCIUM in the last 72 hours. LFT No results for input(s): PROT, ALBUMIN, AST, ALT, ALKPHOS, BILITOT, BILIDIR, IBILI in the last 72 hours. PT/INR No results for input(s): LABPROT, INR in the last 72 hours.   Impression / Plan: This is a 76 y.o.female who presents for EGD for evaluation of dysphagia and history of  previous oropharyngeal dysphagia and prior ENT botox to cricopharyngeus with prior history of SCC of the tongue years ago.  The risks and benefits of endoscopic evaluation/treatment were discussed with the patient and/or family; these include but are not limited to the risk of perforation, infection, bleeding, missed lesions, lack of diagnosis, severe illness requiring hospitalization, as well as anesthesia and sedation related illnesses.  The patient's history has been reviewed, patient examined, no change in status, and deemed stable for procedure.  The patient and/or family is agreeable to proceed.    Justice Britain, MD [facility] Office # [phone]

## 2021-10-19 NOTE — Op Note (Signed)
[facility] Patient Name: Debra Farrell Procedure Date: 10/19/2021 8:47 AM MRN: [id number] Endoscopist: Corliss Parish , MD Age: 76 Referring MD:  Date of Birth: 11/07/1945 Gender: Female Account #: [id number] Procedure:                Upper GI endoscopy Indications:              Oropharyngeal phase dysphagia, Esophageal                            dysphagia, Dysphagia, Personal history of malignant                            neoplasm of the tongue s/p Chemo-XRT Medicines:                Monitored Anesthesia Care Procedure:                Pre-Anesthesia Assessment:                           - Prior to the procedure, a History and Physical                            was performed, and patient medications and                            allergies were reviewed. The patient's tolerance of                            previous anesthesia was also reviewed. The risks                            and benefits of the procedure and the sedation                            options and risks were discussed with the patient.                            All questions were answered, and informed consent                            was obtained. Prior Anticoagulants: The patient has                            taken no previous anticoagulant or antiplatelet                            agents except for NSAID medication. ASA Grade                            Assessment: III - A patient with severe systemic                            disease. After reviewing the risks and benefits,  the patient was deemed in satisfactory condition to                            undergo the procedure.                           After obtaining informed consent, the endoscope was                            passed under direct vision. Throughout the                            procedure, the patient's blood pressure, pulse, and                            oxygen saturations were monitored  continuously. The                            GIF [id number] #[id number] was introduced through the                            mouth, and advanced to the second part of duodenum.                            The upper GI endoscopy was accomplished without                            difficulty. The patient tolerated the procedure. Scope In: Scope Out: Findings:                 No gross lesions were noted in the majority of the                            esophagus. Biopsies were taken with a cold forceps                            for histology to rule out EoE/LoE.                           One tongue of salmon-colored mucosa was present                            from 38 to 40 cm. No other visible abnormalities                            were present. Biopsies were taken with a cold                            forceps for histology.                           The Z-line was irregular and was found 40 cm from  the incisors.                           After the rest of the EGD was complete, a guidewire                            was placed and the scope was withdrawn. Dilation                            was performed in the esophagus with a Savary                            dilator with no resistance at 16 mm and 18 mm. The                            dilation site was examined following endoscope                            reinsertion and showed no change.                           Patchy mildly erythematous mucosa without bleeding                            was found in the entire examined stomach. Biopsies                            were taken with a cold forceps for histology and                            Helicobacter pylori testing.                           No gross lesions were noted in the duodenal bulb,                            in the first portion of the duodenum and in the                            second portion of the duodenum. Complications:            No  immediate complications. Estimated Blood Loss:     Estimated blood loss was minimal. Impression:               - No gross lesions in majority of esophagus.                            Biopsied.                           - Salmon-colored mucosa suspicious for                            short-segment Barrett's esophagus distally -  biopsied.                           - Z-line irregular, 40 cm from the incisors.                           - Dilation performed in the esophagus.                           - Erythematous mucosa in the stomach. Biopsied.                           - No gross lesions in the duodenal bulb, in the                            first portion of the duodenum and in the second                            portion of the duodenum. Recommendation:           - The patient will be observed post-procedure,                            until all discharge criteria are met.                           - Discharge patient to home.                           - Patient has a contact number available for                            emergencies. The signs and symptoms of potential                            delayed complications were discussed with the                            patient. Return to normal activities tomorrow.                            Written discharge instructions were provided to the                            patient.                           - Dilation diet as per protocol.                           - Please use Cepacol or Halls Lozenges +/-                            Chloraseptic spray for next 72-96 hours to aid in  sore thoat should you experience this.                           - Recommend initiation of once daily PPI 20 mg                            (Omeprazole/Pantoprazole) for gastritis if patient                            desires.                           - Continue present medications.                            - Await pathology results.                           - If further issues are present, can consider                            Manometry to rule out other issues, though she                            certainly has evidence of oropharyngeal dysphagia                            on her prior mBS.                           - The findings and recommendations were discussed                            with the patient.                           - The findings and recommendations were discussed                            with the patient's family. Corliss Parish, MD 10/19/2021 9:17:52 AM

## 2021-10-19 NOTE — Patient Instructions (Signed)
Await pathology results.  Continue present medications.  Begin Omeprazole Liquid daily as ordered. Dilation Diet.  See Handout.    YOU HAD AN ENDOSCOPIC PROCEDURE TODAY AT [facility]:   Refer to the procedure report that was given to you for any specific questions about what was found during the examination.  If the procedure report does not answer your questions, please call your gastroenterologist to clarify.  If you requested that your care partner not be given the details of your procedure findings, then the procedure report has been included in a sealed envelope for you to review at your convenience later.  YOU SHOULD EXPECT: Some feelings of bloating in the abdomen. Passage of more gas than usual.  Walking can help get rid of the air that was put into your GI tract during the procedure and reduce the bloating. If you had a lower endoscopy (such as a colonoscopy or flexible sigmoidoscopy) you may notice spotting of blood in your stool or on the toilet paper. If you underwent a bowel prep for your procedure, you may not have a normal bowel movement for a few days.  Please Note:  You might notice some irritation and congestion in your nose or some drainage.  This is from the oxygen used during your procedure.  There is no need for concern and it should clear up in a day or so.  SYMPTOMS TO REPORT IMMEDIATELY:  Following upper endoscopy (EGD)  Vomiting of blood or coffee ground material  New chest pain or pain under the shoulder blades  Painful or persistently difficult swallowing  New shortness of breath  Fever of 100F or higher  Black, tarry-looking stools  For urgent or emergent issues, a gastroenterologist can be reached at any hour by calling [phone]. Do not use MyChart messaging for urgent concerns.    DIET:  We do recommend a small meal at first, but then you may proceed to your regular diet.  Drink plenty of fluids but you should avoid alcoholic beverages  for 24 hours.  ACTIVITY:  You should plan to take it easy for the rest of today and you should NOT DRIVE or use heavy machinery until tomorrow (because of the sedation medicines used during the test).    FOLLOW UP: Our staff will call the number listed on your records 48-72 hours following your procedure to check on you and address any questions or concerns that you may have regarding the information given to you following your procedure. If we do not reach you, we will leave a message.  We will attempt to reach you two times.  During this call, we will ask if you have developed any symptoms of COVID 19. If you develop any symptoms (ie: fever, flu-like symptoms, shortness of breath, cough etc.) before then, please call [phone].  If you test positive for Covid 19 in the 2 weeks post procedure, please call and report this information to [place].    If any biopsies were taken you will be contacted by phone or by letter within the next 1-3 weeks.  Please call us at [phone] if you have not heard about the biopsies in 3 weeks.    SIGNATURES/CONFIDENTIALITY: You and/or your care partner have signed paperwork which will be entered into your electronic medical record.  These signatures attest to the fact that that the information above on your After Visit Summary has been reviewed and is understood.  Full responsibility of the confidentiality of this discharge  information lies with you and/or your care-partner.

## 2021-10-19 NOTE — Progress Notes (Signed)
Pt's states no medical or surgical changes since previsit or office visit.  ° °Vitals CW °

## 2021-10-19 NOTE — Progress Notes (Signed)
Called to room to assist during endoscopic procedure.  Patient ID and intended procedure confirmed with present staff. Received instructions for my participation in the procedure from the performing physician.  

## 2021-10-19 NOTE — Progress Notes (Signed)
Pt non-responsive, VVS, Report to RN  °

## 2021-10-21 ENCOUNTER — Encounter: Payer: Self-pay | Admitting: Gastroenterology

## 2021-10-21 ENCOUNTER — Telehealth: Payer: Self-pay | Admitting: *Deleted

## 2021-10-21 ENCOUNTER — Telehealth: Payer: Self-pay

## 2021-10-21 NOTE — Telephone Encounter (Signed)
°  Follow up Call-  Call back number 10/19/2021  Post procedure Call Back phone  # [phone]  Permission to leave phone message Yes  Some recent data might be hidden     Patient questions:  Do you have a fever, pain , or abdominal swelling? No. Pain Score  0 *  Have you tolerated food without any problems? Yes.    Have you been able to return to your normal activities? Yes.    Do you have any questions about your discharge instructions: Diet   No. Medications  No. Follow up visit  No.  Do you have questions or concerns about your Care? No.  Actions: * If pain score is 4 or above: No action needed, pain <4.  Have you developed a fever since your procedure? no  2.   Have you had an respiratory symptoms (SOB or cough) since your procedure? no  3.   Have you tested positive for COVID 19 since your procedure no  4.   Have you had any family members/close contacts diagnosed with the COVID 19 since your procedure?  no   If yes to any of these questions please route to Joylene John, RN and Joella Prince, RN

## 2021-10-21 NOTE — Telephone Encounter (Signed)
No answer, left message to call back later today, B.Camari Wisham RN. 

## 2021-11-08 ENCOUNTER — Telehealth: Payer: Self-pay | Admitting: Family Medicine

## 2021-11-08 NOTE — Telephone Encounter (Signed)
Called patient to schedule AWV, LVM if patient calls back please assist in scheduling, any question please ask.   Thanks!

## 2021-11-16 ENCOUNTER — Other Ambulatory Visit (HOSPITAL_COMMUNITY): Payer: Self-pay

## 2021-11-16 DIAGNOSIS — L570 Actinic keratosis: Secondary | ICD-10-CM | POA: Diagnosis not present

## 2021-11-16 MED ORDER — FLUOROURACIL 5 % EX CREA
TOPICAL_CREAM | Freq: Two times a day (BID) | CUTANEOUS | 0 refills | Status: DC
  Filled 2021-11-16: qty 40, 30d supply, fill #0

## 2022-03-01 ENCOUNTER — Encounter: Payer: Self-pay | Admitting: *Deleted

## 2022-04-27 ENCOUNTER — Telehealth: Payer: Self-pay

## 2022-04-27 NOTE — Telephone Encounter (Signed)
Patient calls nurse line reporting a recent "fainting" episode.   Patient reports she was in [place] and passed out by the pool 2 days ago. Patient reports EMS came out and she declined hospital visit. Patient reports she stays well hydrated, however it is hard for her to "eat well" from previous radiation. Patient reports she is back home and just feels fatigued.   Patient declined any more syncope episodes, denies headaches or vision changes.   Patient scheduled for tomorrow with team for evaluation.   Red flags discussed in the meantime.

## 2022-04-28 ENCOUNTER — Encounter: Payer: Self-pay | Admitting: Family Medicine

## 2022-04-28 ENCOUNTER — Other Ambulatory Visit: Payer: Self-pay | Admitting: Family Medicine

## 2022-04-28 ENCOUNTER — Ambulatory Visit (INDEPENDENT_AMBULATORY_CARE_PROVIDER_SITE_OTHER): Payer: Medicare Other | Admitting: Family Medicine

## 2022-04-28 ENCOUNTER — Telehealth: Payer: Self-pay

## 2022-04-28 ENCOUNTER — Ambulatory Visit (INDEPENDENT_AMBULATORY_CARE_PROVIDER_SITE_OTHER): Payer: Medicare Other

## 2022-04-28 VITALS — BP 103/75 | HR 77 | Ht 66.0 in | Wt 139.0 lb

## 2022-04-28 DIAGNOSIS — C01 Malignant neoplasm of base of tongue: Secondary | ICD-10-CM | POA: Diagnosis not present

## 2022-04-28 DIAGNOSIS — R1314 Dysphagia, pharyngoesophageal phase: Secondary | ICD-10-CM

## 2022-04-28 DIAGNOSIS — R55 Syncope and collapse: Secondary | ICD-10-CM

## 2022-04-28 DIAGNOSIS — R32 Unspecified urinary incontinence: Secondary | ICD-10-CM

## 2022-04-28 NOTE — Patient Instructions (Signed)
Please fill out the bladder diary and bring it with you to your next appointment.  Please do a 24 hour recall or diary of what you ate for a day. I am putting in a referral to Speech and Language Pathology to do a swallow study again to check for any aspiration problems and where the problem swallowing is. You may have to go see Gastroenterology again.  I am also scheduling your ECHO (an ultrasound of your heart) to check if you have any structural problems causing your passing out episode.  Please follow up with me in a month.

## 2022-04-28 NOTE — Telephone Encounter (Signed)
Patient calls nurse line wanting to update provider.   Patient reports she is scheduled for her ECHO tomorrow and ZIO Monitor this afternoon. However, she can not have monitor on for ECHO tomorrow.   Patient reports she is rescheduling ZIO for after ECHO tomorrow.

## 2022-04-28 NOTE — Progress Notes (Addendum)
    SUBJECTIVE:   CHIEF COMPLAINT / HPI: Near Syncopal Episode   Patient states that she had a near syncopal episode 2 days ago She said the only symptom she had was weakness in her leg and light headedness. She was at the pool in [place] and says she had been drinking a lot of water. When EMS came to the scene patient had normal vitals and did not want to go to the hospital as she was leaving the state that afternoon. She says she has never had this happen before.   Mentions she has been having more trouble eating recently. She has had dysphagia since 2017 after radiation for tongue cancer. She has been followed by GI with multiple dilations. She has had evidence of aspiration on swallow study. She says that she has to swallow, regurgitate and reswallow her food. She says she has difficulty eating with her dentures as well. She eats minimal food throughout the day and does not like ensures.   She also mentions that she has been having urinary incontinence. She wakes up to urinate 4-5 times per night. She says she has had multiple previous surgeries for incontinence but says she has not tried medications before.   PERTINENT  PMH / PSH: Hx of Cancer of base of tongue, Lung Cancer, Anemia   OBJECTIVE:   BP 103/75   Pulse 77   Ht 5\' 6"  (1.676 m)   Wt 139 lb (63 kg)   SpO2 99%   BMI 22.44 kg/m   General: Well appearing  CV: RRR, no M/R/G Resp: CTAB Abd: flat, non tender, non distended, indent from previous g tube  Skin: tan, crepey   ASSESSMENT/PLAN:   Dysphagia Most likely due to esophageal stricture in the setting of radiation for tongue cancer. Has previously seen GI, and had dilations. Has seen SLP with evidence of mild aspiration previously.  - SLP consult  - 24 hr food recall  - Possibly refer to GI after SLP consult  Syncope Syncope most likely secondary to dehydration in the setting of being outside in the heat. Could be due to hypoglycemia, electrolyte abnormalities. Denied  palpitations. No history of cardiac issues. No neuro deficits. Do not expect seizure as she did not have post ictal symptoms, loss of bowel, or bladder. Cannot rule out anemia as a cause; however, she is chronically anemic. Previous TSH was normal. Does not have other hyperthyroid symptoms.  - Ordered zio patch  - Ordered ECHO - ordered CBC, Ferritin, CMP, B12   Urinary incontinence Most likely urge or overflow incontinence. Less likely infection as she does not have dysuria. Has previously had surgeries (sling, and rectocele repair) for incontinence.  - Bladder diary  - urinate before going to sleep     Lowry Ram, MD [place]

## 2022-04-28 NOTE — Assessment & Plan Note (Addendum)
Syncope most likely secondary to dehydration in the setting of being outside in the heat. Could be due to hypoglycemia, electrolyte abnormalities. Denied palpitations. No history of cardiac issues. No neuro deficits. Do not expect seizure as she did not have post ictal symptoms, loss of bowel, or bladder. Cannot rule out anemia as a cause; however, she is chronically anemic. Previous TSH was normal. Does not have other hyperthyroid symptoms.  - Ordered zio patch  - Ordered ECHO - ordered CBC, Ferritin, CMP, B12

## 2022-04-28 NOTE — Assessment & Plan Note (Addendum)
Most likely urge or overflow incontinence. Less likely infection as she does not have dysuria. Has previously had surgeries (sling, and rectocele repair) for incontinence.  - Bladder diary  - urinate before going to sleep

## 2022-04-28 NOTE — Assessment & Plan Note (Addendum)
Most likely due to esophageal stricture in the setting of radiation for tongue cancer. Has previously seen GI, and had dilations. Has seen SLP with evidence of mild aspiration previously.  - SLP consult  - 24 hr food recall  - Possibly refer to GI after SLP consult

## 2022-04-28 NOTE — Progress Notes (Unsigned)
Enrolled for [employer] to mail a ZIO XT long term holter monitor to the patients address on file.   DOD to read. 

## 2022-04-29 ENCOUNTER — Ambulatory Visit (HOSPITAL_COMMUNITY)
Admission: RE | Admit: 2022-04-29 | Discharge: 2022-04-29 | Disposition: A | Discharge status: Home / Self Care | Payer: Medicare Other | Source: Ambulatory Visit | Attending: Family Medicine | Admitting: Family Medicine

## 2022-04-29 DIAGNOSIS — E785 Hyperlipidemia, unspecified: Secondary | ICD-10-CM | POA: Diagnosis not present

## 2022-04-29 DIAGNOSIS — I10 Essential (primary) hypertension: Secondary | ICD-10-CM | POA: Insufficient documentation

## 2022-04-29 DIAGNOSIS — R55 Syncope and collapse: Secondary | ICD-10-CM | POA: Diagnosis not present

## 2022-04-29 DIAGNOSIS — I351 Nonrheumatic aortic (valve) insufficiency: Secondary | ICD-10-CM | POA: Diagnosis not present

## 2022-04-29 LAB — ECHOCARDIOGRAM COMPLETE
AR max vel: 2.7 cm2
AV Area VTI: 2.52 cm2
AV Area mean vel: 2.64 cm2
AV Mean grad: 4 mmHg
AV Peak grad: 6.4 mmHg
Ao pk vel: 1.26 m/s
Area-P 1/2: 5.27 cm2
S' Lateral: 3.3 cm

## 2022-04-29 LAB — CBC
Hematocrit: 30.9 % — ABNORMAL LOW (ref 34.0–46.6)
Hemoglobin: 9.7 g/dL — ABNORMAL LOW (ref 11.1–15.9)
MCH: 19.9 pg — ABNORMAL LOW (ref 26.6–33.0)
MCHC: 31.4 g/dL — ABNORMAL LOW (ref 31.5–35.7)
MCV: 63 fL — ABNORMAL LOW (ref 79–97)
Platelets: 270 10*3/uL (ref 150–450)
RBC: 4.88 x10E6/uL (ref 3.77–5.28)
RDW: 14.4 % (ref 11.7–15.4)
WBC: 4 10*3/uL (ref 3.4–10.8)

## 2022-04-29 LAB — FERRITIN: Ferritin: 318 ng/mL — ABNORMAL HIGH (ref 15–150)

## 2022-04-29 LAB — COMPREHENSIVE METABOLIC PANEL
ALT: 9 IU/L (ref 0–32)
AST: 23 IU/L (ref 0–40)
Albumin/Globulin Ratio: 2.4 — ABNORMAL HIGH (ref 1.2–2.2)
Albumin: 4.6 g/dL (ref 3.8–4.8)
Alkaline Phosphatase: 91 IU/L (ref 44–121)
BUN/Creatinine Ratio: 27 (ref 12–28)
BUN: 38 mg/dL — ABNORMAL HIGH (ref 8–27)
Bilirubin Total: 0.7 mg/dL (ref 0.0–1.2)
CO2: 22 mmol/L (ref 20–29)
Calcium: 9.3 mg/dL (ref 8.7–10.3)
Chloride: 104 mmol/L (ref 96–106)
Creatinine, Ser: 1.4 mg/dL — ABNORMAL HIGH (ref 0.57–1.00)
Globulin, Total: 1.9 g/dL (ref 1.5–4.5)
Glucose: 97 mg/dL (ref 70–99)
Potassium: 3.8 mmol/L (ref 3.5–5.2)
Sodium: 141 mmol/L (ref 134–144)
Total Protein: 6.5 g/dL (ref 6.0–8.5)
eGFR: 39 mL/min/{1.73_m2} — ABNORMAL LOW (ref >59)

## 2022-04-29 LAB — VITAMIN B12: Vitamin B-12: 388 pg/mL (ref 232–1245)

## 2022-04-29 LAB — TSH RFX ON ABNORMAL TO FREE T4: TSH: 3 u[IU]/mL (ref 0.450–4.500)

## 2022-04-29 NOTE — Progress Notes (Signed)
  Echocardiogram 2D Echocardiogram has been performed.  Debra Farrell 04/29/2022, 1:45 PM

## 2022-04-30 DIAGNOSIS — R55 Syncope and collapse: Secondary | ICD-10-CM | POA: Diagnosis not present

## 2022-05-18 DIAGNOSIS — R55 Syncope and collapse: Secondary | ICD-10-CM | POA: Diagnosis not present

## 2022-05-20 ENCOUNTER — Other Ambulatory Visit (HOSPITAL_COMMUNITY): Payer: Self-pay

## 2022-05-20 MED ORDER — METOPROLOL SUCCINATE ER 25 MG PO TB24
25.0000 mg | ORAL_TABLET | Freq: Every day | ORAL | 3 refills | Status: DC
  Filled 2022-05-20: qty 90, 90d supply, fill #0

## 2022-05-20 NOTE — Therapy (Unsigned)
OUTPATIENT SPEECH LANGUAGE PATHOLOGY SWALLOW EVALUATION   Patient Name: Debra Farrell MRN: [id number] DOB:09-10-1946, 76 y.o., female Today's Date: 05/23/2022  PCP: Sabino Dick, DO REFERRING PROVIDER: Westley Chandler, MD   End of Session - 05/23/22 1122     Visit Number 1    Number of Visits 1    SLP Start Time 1032    SLP Stop Time  1112    SLP Time Calculation (min) 40 min    Activity Tolerance Patient tolerated treatment well             Past Medical History:  Diagnosis Date   Abdominal abscess 11/30/2015   Anemia in chronic illness 12/16/2015   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   Eczema    GERD (gastroesophageal reflux disease)    Hx of radiation therapy 11/11/15- 01/05/2016   Base of Tongue and bilateral neck   Hx of radiation therapy 03/14/16- 03/23/16   Left Lower Lung   Hyperlipidemia    Hypertension    no meds   Infection with methicillin-resistant Staphylococcus aureus (MRSA) 11/30/2015   Intractable nausea and vomiting 11/13/2015   Laceration 04/2014   around R ear, for falling out of bed & hitting nightstand   Lung cancer (HCC) dx'd 01/2016   Nausea without vomiting 12/16/2015   Status post dilation of esophageal narrowing    Thalassemia minor    Throat cancer (HCC)    Throat pain in adult 12/29/2015   Past Surgical History:  Procedure Laterality Date   ABDOMINAL HYSTERECTOMY  1990   partial   DIRECT LARYNGOSCOPY N/A 10/28/2015   Procedure: DIRECT LARYNGOSCOPY WITH BIOPSY;  Surgeon: Flo Shanks, MD;  Location: [facility] OR;  Service: ENT;  Laterality: N/A;   DIRECT LARYNGOSCOPY N/A 03/13/2017   Procedure: DIRECT LARYNGOSCOPY;  Surgeon: Flo Shanks, MD;  Location: [facility];  Service: ENT;  Laterality: N/A;   DIRECT LARYNGOSCOPY N/A 01/18/2018   Procedure: DIRECT LARYNGOSCOPY;  Surgeon: Flo Shanks, MD;  Location: [facility] OR;  Service: ENT;  Laterality: N/A;   ESOPHAGOSCOPY N/A 10/28/2015   Procedure: ESOPHAGOSCOPY;   Surgeon: Flo Shanks, MD;  Location: [facility] OR;  Service: ENT;  Laterality: N/A;   ESOPHAGOSCOPY WITH DILITATION N/A 03/13/2017   Procedure: ESOPHAGOSCOPY WITH DILITATION;  Surgeon: Flo Shanks, MD;  Location: [facility];  Service: ENT;  Laterality: N/A;   ESOPHAGOSCOPY WITH DILITATION N/A 01/18/2018   Procedure: ESOPHAGOSCOPY WITH DILITATION POSSIBLE BOTOX;  Surgeon: Flo Shanks, MD;  Location: [facility] OR;  Service: ENT;  Laterality: N/A;   INCONTINENCE SURGERY  1990, [phone]   IR CV LINE INJECTION  06/28/2018   IR CV LINE INJECTION  07/23/2018   IR REMOVAL TUN ACCESS W/ PORT W/O FL MOD SED  03/21/2019   IR REMOVE CV FIBRIN SHEATH  07/23/2018   IR REPLACE G-TUBE SIMPLE WO FLUORO  06/19/2017   IR REPLACE G-TUBE SIMPLE WO FLUORO  09/05/2017   IR REPLACE G-TUBE SIMPLE WO FLUORO  12/21/2017   IR REPLC GASTRO/COLONIC TUBE PERCUT W/FLUORO  07/02/2018   IR US GUIDE VASC ACCESS RIGHT  07/23/2018   LARYNGOSCOPY AND BRONCHOSCOPY N/A 10/28/2015   Procedure: BRONCHOSCOPY;  Surgeon: Flo Shanks, MD;  Location: [facility] OR;  Service: ENT;  Laterality: N/A;   MULTIPLE EXTRACTIONS WITH ALVEOLOPLASTY N/A 10/28/2015   Procedure: Extraction of tooth #'s 2-12, 14,15,17,18,20-29, 31 with alveoloplasy and mandibular left torus reduction;  Surgeon: Charlynne Pander, DDS;  Location: [facility] OR;  Service: Oral Surgery;  Laterality:  N/A;   port-a-cath insertion     RECTOCELE REPAIR     TONSILLECTOMY     as a child   UPPER GASTROINTESTINAL ENDOSCOPY     urocele     correction surgery   Patient Active Problem List   Diagnosis Date Noted   Syncope 04/28/2022   Urinary incontinence 04/28/2022   Subclinical hypothyroidism 04/05/2021   Lipoma 04/05/2021   Healthcare maintenance 04/05/2021   Elevated blood pressure reading 04/05/2021   Thyroid nodule 02/28/2019   Acquired hypothyroidism 03/19/2018   Metastasis to lung (HCC) 09/02/2016   Pancytopenia, acquired (HCC) 09/02/2016   Preventive measure  09/02/2016   Lymphedema in adult patient 05/13/2016   Malignant neoplasm of lower lobe of left lung (HCC) 02/17/2016   Malnutrition of moderate degree 01/04/2016   Goals of care, counseling/discussion    Dysphagia 12/31/2015   Mucositis due to radiation therapy 12/31/2015   Odynophagia    Throat pain in adult 12/29/2015   Anemia in chronic illness 12/16/2015   Intractable nausea and vomiting 11/13/2015   Gastritis, acute 11/13/2015   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   Lipoma of axilla 03/21/2013   Sleep disorder 03/17/2013   Health care maintenance 03/17/2013   Health maintenance examination 10/17/2011   Thalassemia 10/17/2011   Essential hypertension 10/17/2011   Hyperlipidemia 10/17/2011   Headache(784.0) 10/17/2011    ONSET DATE: Pt reporting since 2017, referral received 04-28-2022  REFERRING DIAG: R13.14 (ICD-10-CM) - Pharyngoesophageal dysphagia  THERAPY DIAG:  Dysphagia, unspecified type  Rationale for Evaluation and Treatment Rehabilitation  SUBJECTIVE:   SUBJECTIVE STATEMENT: "I've had trouble since my cancer"  Pt accompanied by: self  PERTINENT HISTORY:  dysphagia since 2017 after radiation for tongue cancer. She has been followed by GI with multiple dilations. She has had evidence of aspiration on swallow study. She says that she has to swallow, regurgitate and reswallow her food. She says she has difficulty eating with her dentures as well. She eats minimal food throughout the day and does not like ensures.  PAIN:  Are you having pain? No  FALLS: Has patient fallen in last 6 months?  No  LIVING ENVIRONMENT: Lives with: lives with their daughter Lives in: House/apartment  PLOF:  Level of assistance: Independent with IADLs Employment: Retired   PATIENT GOALS "eat steak"  OBJECTIVE:   DG ESOPHAGUS W SINGLE CM September 02, 2021: IMPRESSION: 1. Laryngeal penetration and frank aspiration with delayed and minimal cough response. Recommend speech  pathology evaluation and possible modified barium swallow. 2. Examination was limited because of the frank aspiration. No esophageal mass or stricture was identified.  OBJECTIVE SWALLOW STUDY (MBSS) September 29, 2021:  Oral phase WNL. There is evidence of post-radiation effects with decreased pharyngeal contraction leading to mild residue of solids in valleculae and cervical esophagus - residue clears with thin liquid wash. There were two incidents of penetration with thin liquids - both times, liquids reached the vocal folds, eliciting an effective cough response that ejected penetrant from larynx.   COGNITION: Overall cognitive status: Within functional limits for tasks assessed  ORAL MOTOR EXAMINATION Overall status: WFL  CLINICAL SWALLOW ASSESSMENT:   Current diet: Dysphagia 2 (chopped/minced), Dysphagia 1 (puree), thin liquids, and current diet modifications: avoiding meats Dentition: edentulous wears dentures, does not use for eating  Patient directly observed with POs: Yes: dysphagia 1 (puree) and thin liquids  Feeding: able to feed self Liquids provided by: cup; sequential sips, alternating solids/liquids, and single sips Oral phase signs and symptoms:  n/a Pharyngeal phase signs and symptoms: delayed throat clear Comments: Pt reports prolonged time required for meals and some difficulty masticating, both 2/2 being edentulous. Pt is moistening food when able and avoiding particularly challenging foods. Is using usual liquid wash which is successful in clearing reporting usual globus sensation with dry foods. Pt with x3 prior esophageal stretching, first 2 successful in relieving dysphagia symptoms, last one not d/t pt reported physician/procedure change (amount of stretching?).   TODAY'S TREATMENT:  SLP provided pt with dysphagia exercise regimen and provided coaching through completion of each. Pt verbalized understanding of purpose of exercises, how to complete. Pt teaches back with  rare min-A. Education on aspiration precautions and modifications to aid in successful PO intake. SLP provided education on evaluation results and recommendation. All questions answered to satisfaction. Pt is in agreement with no ongoing therapy or repeat swallow study.    PATIENT EDUCATION: Education details: see above Person educated: Patient Education method: Explanation, Demonstration, and Handouts Education comprehension: verbalized understanding and returned demonstration   ASSESSMENT:  CLINICAL IMPRESSION: Patient is a 76 y.o. F who was seen today for dysphagia evaluation d/t ongoing c/o difficulty swallowing s/p head and neck cancer tx. Pt with prior OP ST in which exercise regimen was implemented. PMH significant for head and neck cancer treated with radiation, GERD, and esophageal dilation. Today, pt presenting with mild dysphagia with likely esophageal involvement. Pt's dysphagia is characterized by difficulty with masticating, x2 delayed throat clears, and reports of globus sensations with dry foods. SLP provided education on aspiration precautions and oral PO strategies to aid in optimization of oral intake. SLP recommends dysphagia exercises to improve swallow efficacy. Pt to implement dysphagia exercise regimen at home, declines treatment course. All pt questions answered to satisfaction. Referring physician may wish to consider f/u to GI to further assess pt's swallow function with esophageal phase of swallowing d/t pt reported frequent regurgitation and post prandial globus sensation.    OBJECTIVE IMPAIRMENTS include dysphagia. These impairments are limiting patient from safety when swallowing. Factors affecting potential to achieve goals and functional outcome are ability to learn/carryover information, co-morbidities, and previous level of function. Patient will benefit from skilled SLP services to address above impairments and improve overall function.   PLAN: SLP FREQUENCY:  one time visit   Maia Breslow, CCC-SLP 05/23/2022, 11:24 AM

## 2022-05-20 NOTE — Telephone Encounter (Signed)
Called pt. Pt did not answer.  Called alternative contact (daughter) Ellard Artis. She was not with the patient at that time.  Left VM for patient.  Wanted to discuss results of Holter monitor.

## 2022-05-23 ENCOUNTER — Ambulatory Visit: Payer: Medicare Other | Attending: Family Medicine | Admitting: Speech Pathology

## 2022-05-23 ENCOUNTER — Encounter: Payer: Self-pay | Admitting: Speech Pathology

## 2022-05-23 DIAGNOSIS — R1310 Dysphagia, unspecified: Secondary | ICD-10-CM

## 2022-05-23 NOTE — Patient Instructions (Signed)
SWALLOWING EXERCISES Effortful Swallows - Squeeze hard with the muscles in your neck while you swallow your  saliva or a sip of water - Repeat 20 times, 2-3 times a day, and use whenever you eat or drink  Masako Swallow - swallow with your tongue sticking out - Stick tongue out and gently bite tongue with your teeth - Swallow, while holding your tongue with your teeth - Repeat 20 times, 2-3 times a day   Shaker Exercise - head lift - Lie flat on your back in your bed or on a couch without pillows - Raise your head and look at your feet  - KEEP YOUR SHOULDERS DOWN - HOLD FOR 45 to 60 SECONDS, then lower your head back down - Repeat 3 times, 2-3 times a day   [employer] -  swallow as tight as you  for 5 seconds - Start to swallow, and keep your Adam's apple up by squeezing tight with the muscles of the throat - Hold the squeeze for 5-7 seconds and then relax - Repeat 20 times, 2-3 times a day   Tongue Press - Press your entire tongue as hard as you can against the roof of your mouth for 3-5 seconds - Repeat 20 times, 2-3 times a day        6. CTAR - Chin Tuck Against Resistance              - Place towel, ball or pool noodle under your chin             - Hold for 60 seconds 2-3x  a day             - Pulse up and down 20x 2-3x a day       7. Hold your breath, swallow and cough - do not breathe in before you cough              - 20x twice a day

## 2022-05-26 ENCOUNTER — Telehealth: Payer: Self-pay | Admitting: Family Medicine

## 2022-05-26 NOTE — Telephone Encounter (Signed)
Tried to call patient once again regarding zio patch. Patient did not answer the phone. Left vm.

## 2022-06-01 ENCOUNTER — Other Ambulatory Visit (HOSPITAL_COMMUNITY): Payer: Self-pay

## 2023-09-12 DIAGNOSIS — H43393 Other vitreous opacities, bilateral: Secondary | ICD-10-CM | POA: Diagnosis not present

## 2023-09-12 DIAGNOSIS — H5319 Other subjective visual disturbances: Secondary | ICD-10-CM | POA: Diagnosis not present

## 2024-03-20 ENCOUNTER — Encounter (HOSPITAL_COMMUNITY): Payer: Self-pay

## 2024-03-20 ENCOUNTER — Ambulatory Visit (HOSPITAL_COMMUNITY)
Admission: EM | Admit: 2024-03-20 | Discharge: 2024-03-20 | Disposition: A | Discharge status: Home / Self Care | Attending: Family Medicine | Admitting: Family Medicine

## 2024-03-20 ENCOUNTER — Ambulatory Visit (INDEPENDENT_AMBULATORY_CARE_PROVIDER_SITE_OTHER)

## 2024-03-20 DIAGNOSIS — M47816 Spondylosis without myelopathy or radiculopathy, lumbar region: Secondary | ICD-10-CM | POA: Diagnosis not present

## 2024-03-20 DIAGNOSIS — I10 Essential (primary) hypertension: Secondary | ICD-10-CM | POA: Diagnosis not present

## 2024-03-20 DIAGNOSIS — M25552 Pain in left hip: Secondary | ICD-10-CM

## 2024-03-20 DIAGNOSIS — M160 Bilateral primary osteoarthritis of hip: Secondary | ICD-10-CM | POA: Diagnosis not present

## 2024-03-20 MED ORDER — METHYLPREDNISOLONE 4 MG PO TBPK: ORAL_TABLET | ORAL | 0 refills | Status: DC

## 2024-03-20 NOTE — Discharge Instructions (Signed)
 Your blood pressure was noted to be elevated during your visit today. If you are currently taking medication for high blood pressure, please ensure you are taking this as directed. If you do not have a history of high blood pressure and your blood pressure remains persistently elevated, you may need to begin taking a medication at some point. You may return here within the next few days to recheck if unable to see your primary care provider or if you do not have a one.  BP (!) 173/83 (BP Location: Left Arm)   Pulse 86   Temp 98.1 F (36.7 C) (Oral)   Resp 16   SpO2 98%   BP Readings from Last 3 Encounters:  03/20/24 (!) 173/83  04/28/22 103/75  10/19/21 (!) 159/87

## 2024-03-20 NOTE — ED Triage Notes (Signed)
 Patient reports that she has had left hip and left leg pain x 1 week. Patient denies any injuries. Patient states she has not eaten due to pain and has lost 10 lbs.   Patient states she has been taking Advil , but it does not work.

## 2024-03-23 NOTE — ED Provider Notes (Signed)
 [facility]   [phone] 03/20/24 Arrival Time: 9048  ASSESSMENT & PLAN:  1. Left hip pain   2. Elevated blood pressure reading in office with diagnosis of hypertension    I have personally viewed and independently interpreted the imaging studies ordered this visit. L hip: arthritic changes; no acute fx appreciated.  Tylenol  in addition to trial of: Discharge Medication List as of 03/20/2024 11:44 AM     START taking these medications   Details  methylPREDNISolone  (MEDROL  DOSEPAK) 4 MG TBPK tablet Take as directed., Normal        Orders Placed This Encounter  Procedures   DG Hip Unilat W or Wo Pelvis 2-3 Views Left   Recommend:  Follow-up Information     [facility].   Why: If worsening or failing to improve as anticipated. Contact information: [address] [place]  [address] [phone]                 Discharge Instructions      Your blood pressure was noted to be elevated during your visit today. If you are currently taking medication for high blood pressure, please ensure you are taking this as directed. If you do not have a history of high blood pressure and your blood pressure remains persistently elevated, you may need to begin taking a medication at some point. You may return here within the next few days to recheck if unable to see your primary care provider or if you do not have a one.  BP (!) 173/83 (BP Location: Left Arm)   Pulse 86   Temp 98.1 F (36.7 C) (Oral)   Resp 16   SpO2 98%   BP Readings from Last 3 Encounters:  03/20/24 (!) 173/83  04/28/22 103/75  10/19/21 (!) 159/87         Reviewed expectations re: course of current medical issues. Questions answered. Outlined signs and symptoms indicating need for more acute intervention. Patient verbalized understanding. After Visit Summary given.  SUBJECTIVE: History from: patient. Debra Farrell is a 78 y.o.  female who reports fairly persistent LEFT hip pain with a pull down my left leg; x 1 week; gradual onset; denies injury/trauma. Is ambulatory. Advil  without relief. Denies extremity sensation changes or weakness. Normal bowel/bladder habits. Denies LBP.  Increased blood pressure noted today. Reports that she has been dx with HTN in past. No current treatment.  Past Surgical History:  Procedure Laterality Date   ABDOMINAL HYSTERECTOMY  1990   partial   DIRECT LARYNGOSCOPY N/A 10/28/2015   Procedure: DIRECT LARYNGOSCOPY WITH BIOPSY;  Surgeon: Marlyce Finer, MD;  Location: [facility] OR;  Service: ENT;  Laterality: N/A;   DIRECT LARYNGOSCOPY N/A 03/13/2017   Procedure: DIRECT LARYNGOSCOPY;  Surgeon: Finer Marlyce, MD;  Location: [facility];  Service: ENT;  Laterality: N/A;   DIRECT LARYNGOSCOPY N/A 01/18/2018   Procedure: DIRECT LARYNGOSCOPY;  Surgeon: Finer Marlyce, MD;  Location: [facility] OR;  Service: ENT;  Laterality: N/A;   ESOPHAGOSCOPY N/A 10/28/2015   Procedure: ESOPHAGOSCOPY;  Surgeon: Marlyce Finer, MD;  Location: [facility] OR;  Service: ENT;  Laterality: N/A;   ESOPHAGOSCOPY WITH DILITATION N/A 03/13/2017   Procedure: ESOPHAGOSCOPY WITH DILITATION;  Surgeon: Finer Marlyce, MD;  Location: [facility];  Service: ENT;  Laterality: N/A;   ESOPHAGOSCOPY WITH DILITATION N/A 01/18/2018   Procedure: ESOPHAGOSCOPY WITH DILITATION POSSIBLE BOTOX ;  Surgeon: Finer Marlyce, MD;  Location: [facility] OR;  Service: ENT;  Laterality: N/A;   INCONTINENCE SURGERY  1990, [phone]   IR CV LINE INJECTION  06/28/2018   IR CV LINE INJECTION  07/23/2018   IR REMOVAL TUN ACCESS W/ PORT W/O FL MOD SED  03/21/2019   IR REMOVE CV FIBRIN SHEATH  07/23/2018   IR REPLACE G-TUBE SIMPLE WO FLUORO  06/19/2017   IR REPLACE G-TUBE SIMPLE WO FLUORO  09/05/2017   IR REPLACE G-TUBE SIMPLE WO FLUORO  12/21/2017   IR REPLC GASTRO/COLONIC TUBE PERCUT W/FLUORO  07/02/2018   IR US  GUIDE VASC ACCESS RIGHT   07/23/2018   LARYNGOSCOPY AND BRONCHOSCOPY N/A 10/28/2015   Procedure: BRONCHOSCOPY;  Surgeon: Marlyce Finer, MD;  Location: [facility] OR;  Service: ENT;  Laterality: N/A;   MULTIPLE EXTRACTIONS WITH ALVEOLOPLASTY N/A 10/28/2015   Procedure: Extraction of tooth #'s 2-12, 14,15,17,18,20-29, 31 with alveoloplasy and mandibular left torus reduction;  Surgeon: Tanda JULIANNA Fanny, DDS;  Location: [facility] OR;  Service: Oral Surgery;  Laterality: N/A;   port-a-cath insertion     RECTOCELE REPAIR     TONSILLECTOMY     as a child   UPPER GASTROINTESTINAL ENDOSCOPY     urocele     correction surgery      OBJECTIVE:  Vitals:   03/20/24 1017  BP: (!) 173/83  Pulse: 86  Resp: 16  Temp: 98.1 F (36.7 C)  TempSrc: Oral  SpO2: 98%    General appearance: alert; no distress HEENT: [place]; AT Neck: supple with FROM Resp: unlabored respirations Extremities: LLE: warm with well perfused appearance; poorly localized moderate tenderness over left lateral hip but not specifically over greater trochanter; without gross deformities; swelling: none; bruising: none; hip ROM: normal, with discomfort CV: brisk extremity capillary refill of LLE; 2+ DP pulse and PT pulse of LLE. Skin: warm and dry; no visible rashes Neurologic: gait normal; normal sensation and strength of LLE Psychological: alert and cooperative; normal mood and affect  Imaging: DG Hip Unilat W or Wo Pelvis 2-3 Views Left Result Date: 03/20/2024 CLINICAL DATA:  Left hip pain. EXAM: DG HIP (WITH OR WITHOUT PELVIS) 2-3V LEFT COMPARISON:  None Available. FINDINGS: There is no acute fracture or dislocation. 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 Degenerative changes of the lower lumbar spine. Surgical wires presumably related to inguinal hernia repair. The soft tissues are unremarkable. IMPRESSION: 1. No acute fracture or dislocation. 2. 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 Electronically Signed   By: Vanetta Chou M.D.   On:  03/20/2024 11:39       Allergies  Allergen Reactions   Chlorhexidine  Gluconate Itching    CHG SOAP    Morphine And Codeine  Nausea And Vomiting    Past Medical History:  Diagnosis Date   Abdominal abscess 11/30/2015   Anemia in chronic illness 12/16/2015   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   Eczema    GERD (gastroesophageal reflux disease)    Hx of radiation therapy 11/11/15- 01/05/2016   Base of Tongue and bilateral neck   Hx of radiation therapy 03/14/16- 03/23/16   Left Lower Lung   Hyperlipidemia    Hypertension    no meds   Infection with methicillin-resistant Staphylococcus aureus (MRSA) 11/30/2015   Intractable nausea and vomiting 11/13/2015   Laceration 04/2014   around R ear, for falling out of bed & hitting nightstand   Lung cancer (HCC) dx'd 01/2016   Nausea without vomiting 12/16/2015   Status post dilation of esophageal narrowing    Thalassemia minor  Throat cancer (HCC)    Throat pain in adult 12/29/2015   Social History   Socioeconomic History   Marital status: Widowed    Spouse name: Not on file   Number of children: 3   Years of education: Not on file   Highest education level: Not on file  Occupational History    Employer: [employer]   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 3 daughters. 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    Moved to [facility] 2005.  RN at [employer].   Enjoys biking and walking.    Janele Teal- Grandaughter- age 108- Has lived with Carl since birth.       Social Drivers of Corporate investment banker Strain: Not on file  Food Insecurity: Not on file  Transportation Needs: Not on file  Physical  Activity: Not on file  Stress: Not on file  Social Connections: Not on file   Family History  Problem Relation Age of Onset   Lung cancer Mother    Asthma Father    Colon cancer Neg Hx    Esophageal cancer Neg Hx    Rectal cancer Neg Hx    Stomach cancer Neg Hx    Past Surgical History:  Procedure Laterality Date   ABDOMINAL HYSTERECTOMY  1990   partial   DIRECT LARYNGOSCOPY N/A 10/28/2015   Procedure: DIRECT LARYNGOSCOPY WITH BIOPSY;  Surgeon: Marlyce Finer, MD;  Location: [facility] OR;  Service: ENT;  Laterality: N/A;   DIRECT LARYNGOSCOPY N/A 03/13/2017   Procedure: DIRECT LARYNGOSCOPY;  Surgeon: Finer Marlyce, MD;  Location: [facility];  Service: ENT;  Laterality: N/A;   DIRECT LARYNGOSCOPY N/A 01/18/2018   Procedure: DIRECT LARYNGOSCOPY;  Surgeon: Finer Marlyce, MD;  Location[facility] OR;  Service: ENT;  Laterality: N/A;   ESOPHAGOSCOPY N/A 10/28/2015   Procedure: ESOPHAGOSCOPY;  Surgeon: Marlyce Finer, MD;  Location: [facility] OR;  Service: ENT;  Laterality: N/A;   ESOPHAGOSCOPY WITH DILITATION N/A 03/13/2017   Procedure: ESOPHAGOSCOPY WITH DILITATION;  Surgeon: Finer Marlyce, MD;  Location: [facility];  Service: ENT;  Laterality: N/A;   ESOPHAGOSCOPY WITH DILITATION N/A 01/18/2018   Procedure: ESOPHAGOSCOPY WITH DILITATION POSSIBLE BOTOX ;  Surgeon: Finer Marlyce, MD;  Location: [facility] OR;  Service: ENT;  Laterality: N/A;   INCONTINENCE SURGERY  1990, [phone]   IR CV LINE INJECTION  06/28/2018   IR CV LINE INJECTION  07/23/2018   IR REMOVAL TUN ACCESS W/ PORT W/O FL MOD SED  03/21/2019   IR REMOVE CV FIBRIN SHEATH  07/23/2018   IR REPLACE G-TUBE SIMPLE WO FLUORO  06/19/2017   IR REPLACE G-TUBE SIMPLE WO FLUORO  09/05/2017   IR REPLACE G-TUBE SIMPLE WO FLUORO  12/21/2017   IR REPLC GASTRO/COLONIC TUBE PERCUT W/FLUORO  07/02/2018   IR US  GUIDE VASC ACCESS RIGHT  07/23/2018   LARYNGOSCOPY AND BRONCHOSCOPY N/A 10/28/2015   Procedure: BRONCHOSCOPY;  Surgeon:  Marlyce Finer, MD;  Location: [facility] OR;  Service: ENT;  Laterality: N/A;   MULTIPLE EXTRACTIONS WITH ALVEOLOPLASTY N/A 10/28/2015   Procedure: Extraction of tooth #'s 2-12, 14,15,17,18,20-29, 31  with alveoloplasy and mandibular left torus reduction;  Surgeon: Tanda JULIANNA Fanny, DDS;  Location: [facility] OR;  Service: Oral Surgery;  Laterality: N/A;   port-a-cath insertion     RECTOCELE REPAIR     TONSILLECTOMY     as a child   UPPER GASTROINTESTINAL ENDOSCOPY     urocele     correction surgery       Rolinda Rogue, MD 03/23/24 1200

## 2024-03-26 ENCOUNTER — Other Ambulatory Visit: Payer: Self-pay | Admitting: Family Medicine

## 2024-03-26 ENCOUNTER — Encounter: Payer: Self-pay | Admitting: Family Medicine

## 2024-03-26 ENCOUNTER — Ambulatory Visit: Admitting: Family Medicine

## 2024-03-26 ENCOUNTER — Ambulatory Visit
Admission: RE | Admit: 2024-03-26 | Discharge: 2024-03-26 | Disposition: A | Discharge status: Home / Self Care | Source: Ambulatory Visit | Attending: Family Medicine | Admitting: Family Medicine

## 2024-03-26 ENCOUNTER — Other Ambulatory Visit (HOSPITAL_COMMUNITY): Payer: Self-pay

## 2024-03-26 VITALS — BP 106/63 | Ht 66.0 in | Wt 121.0 lb

## 2024-03-26 DIAGNOSIS — M170 Bilateral primary osteoarthritis of knee: Secondary | ICD-10-CM | POA: Diagnosis not present

## 2024-03-26 DIAGNOSIS — M160 Bilateral primary osteoarthritis of hip: Secondary | ICD-10-CM

## 2024-03-26 DIAGNOSIS — M25562 Pain in left knee: Secondary | ICD-10-CM

## 2024-03-26 DIAGNOSIS — M25561 Pain in right knee: Secondary | ICD-10-CM

## 2024-03-26 DIAGNOSIS — M542 Cervicalgia: Secondary | ICD-10-CM

## 2024-03-26 DIAGNOSIS — Z859 Personal history of malignant neoplasm, unspecified: Secondary | ICD-10-CM

## 2024-03-26 MED ORDER — TIZANIDINE HCL 2 MG PO TABS
2.0000 mg | ORAL_TABLET | Freq: Every evening | ORAL | 0 refills | Status: DC
  Filled 2024-03-26: qty 14, 7d supply, fill #0

## 2024-03-26 NOTE — Patient Instructions (Addendum)
 It was a pleasure seeing you today.  Your hip pain is likely due to mild to moderate hip arthritis and possibly bursitis - You would benefit from the use of Voltaren gel every 6-hrs as needed.  This is a topical anti-inflammatory medication to help with pain and inflammation/swelling.  You can purchase this at your local pharmacy over-the-counter. - I sent a prescription for a muscle relaxant - Tizanidine to take at bedtime to help you with sleep - I am also referring you to physical therapy to help strengthen around your hips/core which should help with your pain  For your knee pain: - I placed an order for knee xrays - You would benefit from the use of Voltaren gel every 6-hrs as needed.  This is a topical anti-inflammatory medication to help with pain and inflammation/swelling.  You can purchase this at your local pharmacy over-the-counter. - I sent a prescription for a muscle relaxant - Tizanidine to take at bedtime to help you with sleep - I am also referring you to physical therapy to help strengthen around your hips/core which should help with your pain  For your neck pain: - I suspect this is due to arthritis - You would benefit from the use of Voltaren gel every 6-hrs as needed.  This is a topical anti-inflammatory medication to help with pain and inflammation/swelling.  You can purchase this at your local pharmacy over-the-counter. - I sent a prescription for a muscle relaxant - Tizanidine to take at bedtime to help you with sleep - I am also referring you to physical therapy to help strengthen around your hips/core which should help with your pain  You should follow-up with me in 6-8 weeks or sooner as needed

## 2024-03-26 NOTE — Progress Notes (Unsigned)
 DATE OF VISIT: 03/26/2024        Debra Farrell DOB: [birth year] MRN: [id number]  CC:  hip pain, knee pain, neck pain  History- Debra Farrell is a 78 y.o. female for evaluation and treatment of hip pain, knee pain, neck pain  RE: hip pain Seen at [facility] 03/20/24 for left hip pain - notes reviewed today - given Rx Medrol  dosepak - XR completed showing mild to moderate hip OA bilaterally States that no improvement with Medrol  dose pack Pain has been ongoing for approximately 2 weeks.  Feels along the anterior and lateral hip, but sometimes in the posterior hip Denies any radiation of the pain Denies any injury or trauma No improvement with Advil  800 mg daily No improvement with Tylenol  as needed No prior injections Difficulty with walking - Previously was able to walk and mow her lawn - Previously able to walk up to a mile without any issues, now unable to do these things  Also complaining of knee pain, left greater than right Pain along the medial knee Worse with walking around Denies any swelling Denies any mechanical symptoms No prior imaging  Also complaining of chronic neck pain Denies any injury or trauma Feels that range of motion is decreased No recent imaging Of note, does have history of cancer - History of lung cancer for what sounds to be involvement of the nodes in her neck - Reports radiation therapy to the neck in 2017 -Notes has been in remission and doing well  Past Medical History Past Medical History:  Diagnosis Date   Abdominal abscess 11/30/2015   Anemia in chronic illness 12/16/2015   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   Eczema    GERD (gastroesophageal reflux disease)    Hx of radiation therapy 11/11/15- 01/05/2016   Base of Tongue and bilateral neck   Hx of radiation therapy 03/14/16- 03/23/16   Left Lower Lung   Hyperlipidemia    Hypertension    no meds   Infection with methicillin-resistant Staphylococcus aureus (MRSA) 11/30/2015    Intractable nausea and vomiting 11/13/2015   Laceration 04/2014   around R ear, for falling out of bed & hitting nightstand   Lung cancer (HCC) dx'd 01/2016   Nausea without vomiting 12/16/2015   Status post dilation of esophageal narrowing    Thalassemia minor    Throat cancer (HCC)    Throat pain in adult 12/29/2015    Past Surgical History Past Surgical History:  Procedure Laterality Date   ABDOMINAL HYSTERECTOMY  1990   partial   DIRECT LARYNGOSCOPY N/A 10/28/2015   Procedure: DIRECT LARYNGOSCOPY WITH BIOPSY;  Surgeon: Marlyce Finer, MD;  Location: [facility] OR;  Service: ENT;  Laterality: N/A;   DIRECT LARYNGOSCOPY N/A 03/13/2017   Procedure: DIRECT LARYNGOSCOPY;  Surgeon: Finer Marlyce, MD;  Location: [facility];  Service: ENT;  Laterality: N/A;   DIRECT LARYNGOSCOPY N/A 01/18/2018   Procedure: DIRECT LARYNGOSCOPY;  Surgeon: Finer Marlyce, MD;  Location: [facility] OR;  Service: ENT;  Laterality: N/A;   ESOPHAGOSCOPY N/A 10/28/2015   Procedure: ESOPHAGOSCOPY;  Surgeon: Marlyce Finer, MD;  Location: [facility] OR;  Service: ENT;  Laterality: N/A;   ESOPHAGOSCOPY WITH DILITATION N/A 03/13/2017   Procedure: ESOPHAGOSCOPY WITH DILITATION;  Surgeon: Finer Marlyce, MD;  Location: [facility];  Service: ENT;  Laterality: N/A;   ESOPHAGOSCOPY WITH DILITATION N/A 01/18/2018   Procedure: ESOPHAGOSCOPY WITH DILITATION POSSIBLE BOTOX ;  Surgeon: Finer Marlyce, MD;  Location: [facility] OR;  Service:  ENT;  Laterality: N/A;   INCONTINENCE SURGERY  1990, [phone]   IR CV LINE INJECTION  06/28/2018   IR CV LINE INJECTION  07/23/2018   IR REMOVAL TUN ACCESS W/ PORT W/O FL MOD SED  03/21/2019   IR REMOVE CV FIBRIN SHEATH  07/23/2018   IR REPLACE G-TUBE SIMPLE WO FLUORO  06/19/2017   IR REPLACE G-TUBE SIMPLE WO FLUORO  09/05/2017   IR REPLACE G-TUBE SIMPLE WO FLUORO  12/21/2017   IR REPLC GASTRO/COLONIC TUBE PERCUT W/FLUORO  07/02/2018   IR US  GUIDE VASC ACCESS RIGHT  07/23/2018    LARYNGOSCOPY AND BRONCHOSCOPY N/A 10/28/2015   Procedure: BRONCHOSCOPY;  Surgeon: Marlyce Finer, MD;  Location: [facility] OR;  Service: ENT;  Laterality: N/A;   MULTIPLE EXTRACTIONS WITH ALVEOLOPLASTY N/A 10/28/2015   Procedure: Extraction of tooth #'s 2-12, 14,15,17,18,20-29, 31 with alveoloplasy and mandibular left torus reduction;  Surgeon: Tanda JULIANNA Fanny, DDS;  Location: [facility] OR;  Service: Oral Surgery;  Laterality: N/A;   port-a-cath insertion     RECTOCELE REPAIR     TONSILLECTOMY     as a child   UPPER GASTROINTESTINAL ENDOSCOPY     urocele     correction surgery    Medications Current Outpatient Medications  Medication Sig Dispense Refill   tiZANidine (ZANAFLEX) 2 MG tablet Take 1-2 tablets (2-4 mg total) by mouth at bedtime. 14 tablet 0   No current facility-administered medications for this visit.    Allergies is allergic to chlorhexidine  gluconate and morphine and codeine .  Family History - reviewed per EMR and intake form  Social History   reports no history of alcohol use.  reports that she has never smoked. She has never used smokeless tobacco.  reports no history of drug use.   EXAM: Vitals: BP 106/63   Ht 5' 6 (1.676 m)   Wt 121 lb (54.9 kg)   BMI 19.53 kg/m  General: AOx3, NAD, pleasant SKIN: no rashes or lesions, skin clean, dry, intact MSK: Neck: C-spine without any gross deformity.  No midline or paraspinal tenderness.  Decreased range of motion in all planes limited by approximately 20 to 30% with some pain.  Negative Spurling's bilaterally.  Normal upper extremity grip strength bilaterally  L-spine: No gross deformity.  No midline or paraspinal tenderness.  Good range of motion without pain.  Negative straight leg raise bilaterally.  Does have tight hamstrings  Hips: Bilateral hips with good range of motion without pain.  Left hip with mild tenderness palpation along the anterior, lateral, gluteal area.  Negative FABER and FADIR.  Negative logroll.  Hip  strength 4/5 throughout.  Right hip without any tenderness along the anterior, lateral, gluteal area.  Negative FABER and FADIR.  Strength 4+/5 throughout  Knee: Bilateral knees without swelling or effusion.  Has good range of motion 1+ patellofemoral crepitus.  Left knee with mild medial joint line tenderness.  Negative McMurray bilaterally.  No ligamentous laxity.  Walking without a limp.  NEURO: sensation intact to light touch, DTR + 2/4 Achilles and patella bilaterally VASC: pulses 2+ and symmetric lower extremity bilaterally, no edema  IMAGING: Left hip and pelvis x-ray from 03/20/2024 at urgent care personally reviewed and interpreted by me today showing: -Mild to moderate bilateral hip osteoarthritis - Also noted to have multilevel degenerative changes of the lumbar spine - Surgical wires in place over the lower abdominal area, possibly related to prior known hernia repair  Assessment & Plan Bilateral primary osteoarthritis of hip Bilateral hip pain  left greater than right with mild to moderate underlying osteoarthritis.  No significant improvement with oral steroids, NSAIDs, Tylenol .  Does have associated hip weakness.  Plan: - Urgent care notes reviewed as noted above - X-rays reviewed as noted above - She would benefit from physical therapy.  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  Plan: - Imaging: Will obtain x-rays of the cervical spine to assess for underlying osteoarthritis - Referral to physical therapy given - Follow-up 6 to 8 weeks for reevaluation, sooner as needed Acute pain of both knees Bilateral knee pain, possible underlying osteoarthritis versus pain related to altered gait mechanics from her hip pain  Plan: - Imaging: Bilateral knee x-rays  ordered to assess for osteoarthritis - OTC NSAIDs or Tylenol  as needed - Heat or ice as needed - Knee sleeve as needed - Referral to physical therapy given for strength and stability around the hips and the knees - Follow-up 6 to 8 weeks for reevaluation, sooner as needed.  Could consider injections of the knees if symptoms persist History of cancer History of lung cancer, also history of radiation therapy to the neck which could be contributing to her neck pain  Plan: - Previous notes reviewed - Continue with current treatment with PCP and specialist that she is doing  Patient expressed understanding & agreement with above.  Encounter Diagnoses  Name Primary?   Bilateral primary osteoarthritis of hip Yes   Neck pain    Acute pain of both knees    History of cancer     Orders Placed This Encounter  Procedures   DG Cervical Spine 2 or 3 views   Ambulatory referral to Physical Therapy    Orders Placed This Encounter  Procedures   DG Cervical Spine 2 or 3 views   Ambulatory referral to Physical Therapy

## 2024-03-27 ENCOUNTER — Ambulatory Visit: Payer: Self-pay | Admitting: Family Medicine

## 2024-04-02 ENCOUNTER — Ambulatory Visit: Payer: Self-pay | Admitting: Family Medicine

## 2024-04-09 ENCOUNTER — Other Ambulatory Visit: Payer: Self-pay

## 2024-04-09 ENCOUNTER — Ambulatory Visit: Attending: Family Medicine | Admitting: Physical Therapy

## 2024-04-09 DIAGNOSIS — M542 Cervicalgia: Secondary | ICD-10-CM | POA: Insufficient documentation

## 2024-04-09 DIAGNOSIS — M25552 Pain in left hip: Secondary | ICD-10-CM | POA: Diagnosis not present

## 2024-04-09 DIAGNOSIS — M25562 Pain in left knee: Secondary | ICD-10-CM | POA: Diagnosis not present

## 2024-04-09 DIAGNOSIS — R293 Abnormal posture: Secondary | ICD-10-CM | POA: Insufficient documentation

## 2024-04-09 DIAGNOSIS — M160 Bilateral primary osteoarthritis of hip: Secondary | ICD-10-CM | POA: Insufficient documentation

## 2024-04-09 DIAGNOSIS — M25561 Pain in right knee: Secondary | ICD-10-CM | POA: Diagnosis not present

## 2024-04-09 DIAGNOSIS — M25551 Pain in right hip: Secondary | ICD-10-CM | POA: Insufficient documentation

## 2024-04-09 NOTE — Therapy (Signed)
 OUTPATIENT PHYSICAL THERAPY CERVICAL  EVALUATION   Patient Name: Debra Farrell MRN: [id number] DOB:04-May-1946, 78 y.o., female Today's Date: 04/09/2024  END OF SESSION:  PT End of Session - 04/09/24 0930     Visit Number 1    Number of Visits 17    Date for PT Re-Evaluation 06/04/24    Authorization Type U[employer] MCR    PT Start Time 0932    Activity Tolerance Patient tolerated treatment well    Behavior During Therapy [facility] for tasks assessed/performed          Past Medical History:  Diagnosis Date   Abdominal abscess 11/30/2015   Anemia in chronic illness 12/16/2015   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   Eczema    GERD (gastroesophageal reflux disease)    Hx of radiation therapy 11/11/15- 01/05/2016   Base of Tongue and bilateral neck   Hx of radiation therapy 03/14/16- 03/23/16   Left Lower Lung   Hyperlipidemia    Hypertension    no meds   Infection with methicillin-resistant Staphylococcus aureus (MRSA) 11/30/2015   Intractable nausea and vomiting 11/13/2015   Laceration 04/2014   around R ear, for falling out of bed & hitting nightstand   Lung cancer (HCC) dx'd 01/2016   Nausea without vomiting 12/16/2015   Status post dilation of esophageal narrowing    Thalassemia minor    Throat cancer (HCC)    Throat pain in adult 12/29/2015   Past Surgical History:  Procedure Laterality Date   ABDOMINAL HYSTERECTOMY  1990   partial   DIRECT LARYNGOSCOPY N/A 10/28/2015   Procedure: DIRECT LARYNGOSCOPY WITH BIOPSY;  Surgeon: Marlyce Finer, MD;  Location: [facility] OR;  Service: ENT;  Laterality: N/A;   DIRECT LARYNGOSCOPY N/A 03/13/2017   Procedure: DIRECT LARYNGOSCOPY;  Surgeon: Finer Marlyce, MD;  Location: [facility];  Service: ENT;  Laterality: N/A;   DIRECT LARYNGOSCOPY N/A 01/18/2018   Procedure: DIRECT LARYNGOSCOPY;  Surgeon: Finer Marlyce, MD;  Location: [facility] OR;  Service: ENT;  Laterality: N/A;   ESOPHAGOSCOPY N/A 10/28/2015   Procedure: ESOPHAGOSCOPY;   Surgeon: Marlyce Finer, MD;  Location: [facility] OR;  Service: ENT;  Laterality: N/A;   ESOPHAGOSCOPY WITH DILITATION N/A 03/13/2017   Procedure: ESOPHAGOSCOPY WITH DILITATION;  Surgeon: Finer Marlyce, MD;  Location: [facility];  Service: ENT;  Laterality: N/A;   ESOPHAGOSCOPY WITH DILITATION N/A 01/18/2018   Procedure: ESOPHAGOSCOPY WITH DILITATION POSSIBLE BOTOX ;  Surgeon: Finer Marlyce, MD;  Location: [facility] OR;  Service: ENT;  Laterality: N/A;   INCONTINENCE SURGERY  1990, [phone]   IR CV LINE INJECTION  06/28/2018   IR CV LINE INJECTION  07/23/2018   IR REMOVAL TUN ACCESS W/ PORT W/O FL MOD SED  03/21/2019   IR REMOVE CV FIBRIN SHEATH  07/23/2018   IR REPLACE G-TUBE SIMPLE WO FLUORO  06/19/2017   IR REPLACE G-TUBE SIMPLE WO FLUORO  09/05/2017   IR REPLACE G-TUBE SIMPLE WO FLUORO  12/21/2017   IR REPLC GASTRO/COLONIC TUBE PERCUT W/FLUORO  07/02/2018   IR US  GUIDE VASC ACCESS RIGHT  07/23/2018   LARYNGOSCOPY AND BRONCHOSCOPY N/A 10/28/2015   Procedure: BRONCHOSCOPY;  Surgeon: Marlyce Finer, MD;  Location: [facility] OR;  Service: ENT;  Laterality: N/A;   MULTIPLE EXTRACTIONS WITH ALVEOLOPLASTY N/A 10/28/2015   Procedure: Extraction of tooth #'s 2-12, 14,15,17,18,20-29, 31 with alveoloplasy and mandibular left torus reduction;  Surgeon: Tanda JULIANNA Fanny, DDS;  Location: [facility] OR;  Service: Oral Surgery;  Laterality: N/A;  port-a-cath insertion     RECTOCELE REPAIR     TONSILLECTOMY     as a child   UPPER GASTROINTESTINAL ENDOSCOPY     urocele     correction surgery   Patient Active Problem List   Diagnosis Date Noted   Syncope 04/28/2022   Urinary incontinence 04/28/2022   Subclinical hypothyroidism 04/05/2021   Lipoma 04/05/2021   Healthcare maintenance 04/05/2021   Elevated blood pressure reading 04/05/2021   Thyroid  nodule 02/28/2019   Acquired hypothyroidism 03/19/2018   Metastasis to lung (HCC) 09/02/2016   Pancytopenia, acquired (HCC) 09/02/2016   Preventive measure  09/02/2016   Lymphedema in adult patient 05/13/2016   Malignant neoplasm of lower lobe of left lung (HCC) 02/17/2016   Malnutrition of moderate degree 01/04/2016   Goals of care, counseling/discussion    Dysphagia 12/31/2015   Mucositis due to radiation therapy 12/31/2015   Odynophagia    Throat pain in adult 12/29/2015   Anemia in chronic illness 12/16/2015   Intractable nausea and vomiting 11/13/2015   Gastritis, acute 11/13/2015   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   Lipoma of axilla 03/21/2013   Sleep disorder 03/17/2013   Health care maintenance 03/17/2013   Health maintenance examination 10/17/2011   Thalassemia 10/17/2011   Essential hypertension 10/17/2011   Hyperlipidemia 10/17/2011   Headache 10/17/2011    PCP: Nicholas Bar, MD  REFERRING PROVIDER: Teressa Rainell BROCKS, DO   REFERRING DIAG: 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  Pain is mostly in the back of the neck starting a few years ago  with no specific onset. Pain improves after being up and moving around for a while. She notes holding up to do prolonged activities is hard for example mowing the lawn or reading.   Hips and knees have been giving more issues about 6 weeks ago beginning of June. She reports nothing started this.  She reports about week ago the hip and knee pain has resolved, with some occasional L knee residual intermittent  soreness, but notes  the pain in her neck is the worst.   PERTINENT HISTORY:  Hx of Cx, see PMHx  PAIN:  Are you having pain?  Neck Yes: NPRS scale: 3/10 currently, at worst 10/10 Last 24 hours: 2-10/10 Pain location: back of the neck  Pain description: ache, pulling  Aggravating factors: getting out of bed morning, prolonged looking down atactivities, lifting head Relieving factors: Tylenol ,   Hips / knees Yes: NPRS scale: 0/10 Last 24 hours: 0/10 Pain location: along the side of the hip Pain description: ache Aggravating factors: more activity / movement Relieving factors: Tylenol    PRECAUTIONS: Other: hx of cancer  RED FLAGS: None   WEIGHT BEARING RESTRICTIONS: No  FALLS:  Has patient fallen in last 6 months? Yes. Number of falls 3  LIVING ENVIRONMENT: Lives with: lives with their daughter Lives in: House/apartment Stairs: Yes: Internal: 12 steps; on left going up Has following equipment at home: None  OCCUPATION: retired  PLOF: Independent  PATIENT GOALS: be able to move again, get stuff done without relying on others.    OBJECTIVE:  Note: Objective measures were completed at Evaluation unless otherwise noted.  DIAGNOSTIC FINDINGS:  X-ray Cervical spine 03/26/24 IMPRESSION: Moderate degenerative disc disease at C6-7 and milder degenerative disc disease at C5-6.  X-ray bil knee 03/26/24 IMPRESSION: Mild degenerative changes of the knees.  PATIENT SURVEYS:  NDI:  NECK DISABILITY INDEX  Date: 7/15 Score  Pain intensity 3 = The pain is fairly severe at the moment  2. Personal care (washing, dressing, etc.) 0 = I can look after myself normally without causing extra pain  3. Lifting 5 = I cannot lift or carry anything   4. Reading 2 =  I can read as much as I want with moderate pain in my neck  5. Headaches 0 = I have no headaches at all  6. Concentration 0 =  I can concentrate fully when I want to with no difficulty  7. Work 2 = I can do most of my usual  work, but no more  8. Driving 0 = I can drive my car without any neck pain  9. Sleeping 5 =  My sleep is completely disturbed (5-7 hrs sleepless)   10. Recreation 5 = I can't do any recreation activities at all  Total 22/50   Minimum Detectable Change (90% confidence): 5 points or 10% points  COGNITION: Overall cognitive status: Within functional limits for tasks assessed     SENSATION: Not tested   POSTURE: rounded shoulders, forward head, increased thoracic kyphosis, and    PALPATION: TTP with multiple trigger points along the bil cervicothoracic paraspinals with R>L, bil upper trap tightness, levator scapulae tenderness and sub-occipital tightness, increaed thoracic kyphosis  CERVICAL ROM:   Active ROM A/PROM (deg) 04/09/2024  Flexion 40  Extension 20  Right lateral flexion 1  Left lateral flexion 10  Right rotation 55  Left rotation 55   (Blank rows = not tested)  Note: resting postioning is 12 degrees of flexion          Concordant sx noted with end range flexion.   UE ROM:  Active ROM Right 04/09/2024 Left 04/09/2024  Shoulder flexion [facility]  Shoulder extension [facility]  Shoulder abduction [facility]  Shoulder adduction [facility]  Shoulder extension    Shoulder internal rotation [facility]  Shoulder external rotation [facility] WFL  Elbow flexion    Elbow extension    Wrist flexion    Wrist extension  Wrist ulnar deviation    Wrist radial deviation    Wrist pronation    Wrist supination     (Blank rows = not tested)  UE MMT:  MMT Right 04/09/2024 Left 04/09/2024  Shoulder flexion 4 4-  Shoulder extension 4 4-  Shoulder abduction 4 4-  Shoulder adduction 4 4  Shoulder extension    Shoulder internal rotation 4 4-  Shoulder external rotation 4 4-  Middle trapezius    Lower trapezius    Elbow flexion    Elbow extension    Wrist flexion    Wrist extension    Wrist ulnar deviation    Wrist radial deviation    Wrist pronation    Wrist supination    Grip  strength     (Blank rows = not tested)  CERVICAL SPECIAL TESTS:  Cranial cervical flexion test: able to hold 20 seconds    LOWER EXTREMITY ROM:     Active  Right eval Left eval  Hip flexion    Hip extension    Hip abduction    Hip adduction    Hip internal rotation    Hip external rotation    Knee flexion    Knee extension    Ankle dorsiflexion    Ankle plantarflexion    Ankle inversion    Ankle eversion     (Blank rows = not tested)  LOWER EXTREMITY MMT:    MMT Right eval Left eval  Hip flexion    Hip extension    Hip abduction    Hip adduction    Hip internal rotation    Hip external rotation    Knee flexion    Knee extension    Ankle dorsiflexion    Ankle plantarflexion    Ankle inversion    Ankle eversion     (Blank rows = not tested)    GAIT: Distance walked: from waiting room to tx room Assistive device utilized: None Level of assistance: Complete Independence Comments: postural sway noted with limited trunk rotation   TREATMENT :  [facility] Adult PT Treatment:                                                DATE: 04/09/24 MTPR along the R cervical paraspinals/ sub-occipitals Seated upper trap stretch 1 x 30 sec Supine chin tuck 1 x 5 Discussed postural efficiency Provided initial HEP                                                                                                                                  PATIENT EDUCATION:  Education details: evaluation findings, POC, goals, HEP with proper form/ rationale.  Person educated: Patient Education method: Explanation, Verbal cues, and Handouts Education comprehension: verbalized understanding  HOME EXERCISE PROGRAM: Access Code: [email or web address] URL: [email or web address]  Date: 04/09/2024 Prepared by: Joneen Fresh  Exercises - Supine Chin Tuck with Towel  - 1 x daily - 7 x weekly - 2 sets - 10 reps - 5 hold - Seated or Standing Cervical Retraction  - 1 x daily - 7 x weekly - 2  sets - 10 reps - 5 sec hold - Thoracic Extension Mobilization with Noodle  - 1 x daily - 7 x weekly - 2 sets - 10 reps - 5 seconds hold - Seated Scapular Retraction  - 1 x daily - 7 x weekly - 2 sets - 10 reps - 5 seconds hold - Seated Upper Trapezius Stretch  - 1 x daily - 7 x weekly - 2 sets - 2 reps - 30 seconds hold  ASSESSMENT:  CLINICAL IMPRESSION: Patient is a 78 y.o. F who was seen today for physical therapy evaluation and treatment for referral dx of bil hip OA, bil knee pain, and neck pain. Mrs Cendejas reports her hips/ knees started bothering her 6 weeks ago with no specific onset but has since reduced activity and noted she currently hasn't had any pain for over a week, with the exception of intermittent L knee pain. She reports her CC is Neck pain and limited activity as a result that is worst in the AM or with prolonged positioning/ activities. She demonstrates limited cervical extension and sidebending and is a resting position of 12 degrees of flexion. She exhibits a classic upper cross syndrome with a forward head posture and forward rolled shoulders and significant thoracic kyphosis which potentially could be attributed partially to her PMhx of throat and lung cx. She would benefit from physical therapy to reduce neck pain, strengthening of the posterior chain, maximize cervical and thoracic mobility, maximize postural efficiency and her overall function by addressing the deficits listed .  OBJECTIVE IMPAIRMENTS: decreased endurance, decreased ROM, increased fascial restrictions, increased muscle spasms, improper body mechanics, postural dysfunction, and pain.   ACTIVITY LIMITATIONS: carrying, lifting, and reading, yard work   PARTICIPATION LIMITATIONS: community activity and yard work  PERSONAL FACTORS: Age, Past/current experiences, and 1-2 comorbidities: hx of Cancer in both throat/ lung are also affecting patient's functional outcome.   REHAB POTENTIAL: Good  CLINICAL  DECISION MAKING: Evolving/moderate complexity  EVALUATION COMPLEXITY: Moderate   GOALS: Goals reviewed with patient? Yes  SHORT TERM GOALS: Target date: 05/07/2024  Pt to be IND with initial HEP for therapeutic progression Baseline: Goal status: INITIAL  2.  Pt to verbalize/ demo efficient posture in sitting/ standing to reduce stress/ strain on her neck/ thoracic spine.  Baseline:  Goal status: INITIAL  3.  Pt to improve her cervical resting postion to </= 8 degrees of flexion to reduce stress on cervical paraspinal/ sub-occipitals  Baseline:  Goal status: INITIAL  4.  Pt to report pain to </= 7/10 at max to demo improving condition Baseline:  Goal status: INITIAL   LONG TERM GOALS: Target date: 06/04/2024  Improve cervical sidebending to >/= 15 degrees bil and maintain current cervical mobility with </= 2/10 max pain for safety with ADLs and driving Baseline:  Goal status: INITIAL  2.  Pt to be able to return to yard work, reading for  >/= 45 min reporting pain as </= slight (per the NDI) for pt's personal goals.  Baseline:  Goal status: INITIAL  3.  Pt to be able to lift/ carrying items with report of minimal pain for returning to independent activity and maximize QOL Baseline:  Goal status: INITIAL  4.  Pt to be able to walk/ stand and navigate in home steps with no report of limitations with strength or feeling of balance for safety.  Baseline:  Goal status: INITIAL  5.  Improve NDI to </= 10/50 to demo a measureable improvement in her function.  Baseline:  Goal status: INITIAL  6.  Pt to be IND with all HEP and is able to maintain and progress their current LOF IND.  Baseline:  Goal status: INITIAL  PLAN:  PT FREQUENCY: 1-2x/week  PT DURATION: 8 weeks  PLANNED INTERVENTIONS: 97110-Therapeutic exercises, 97530- Therapeutic activity, [id number]- Neuromuscular re-education, 97535- Self Care, [id number]- Manual therapy, [id number]- Gait training, [phone]- Traction  (mechanical), [id number] (1-2 muscles), 20561 (3+ muscles)- Dry Needling, Taping, Spinal mobilization, Cryotherapy, and Moist heat.  PLAN FOR NEXT SESSION: review/ update HEP PRN, posterior chain activation, pec stretch,STW along cervical paraspinals.    Joneen Fresh PT, DPT, LAT, ATC  04/09/24  11:41 AM     Date of referral: 03/26/24 Referring provider: Teressa Rainell BROCKS, DO  Referring diagnosis? 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 (if different than referring diagnosis) Same as above with added Abnormal posture R29.3  What was this (referring dx) caused by? Ongoing Issue and Unspecified  Nature of Condition: Chronic (continuous duration > 3 months)   Laterality: Both  Current Functional Measure Score: Neck Index 22/50  Objective measurements identify impairments en they are compared to normal values, the uninvolved extremity, and prior level of function.  [x]  Yes  []  No  Objective assessment of functional ability: Severe functional limitations   Briefly describe symptoms: Continued Neck pain that has been present for years limiting prolonged activities requiring   How did symptoms start: insidious onset.  Average pain intensity:  Last 24 hours: 2 - 10/10  Past week: 10/10  How often does the pt experience symptoms? Constantly  How much have the symptoms interfered with usual daily activities? Extremely  How has condition changed since care began at this facility? Much worse  In general, how is the patients overall health? Good   BACK PAIN (STarT Back Screening Tool) No

## 2024-04-15 ENCOUNTER — Encounter: Admitting: Physical Therapy

## 2024-04-17 NOTE — Therapy (Signed)
 OUTPATIENT PHYSICAL THERAPY CERVICAL  TREATMENT   Patient Name: Debra Farrell MRN: [id number] DOB:[birth year], 78 y.o., female Today's Date: 04/18/2024  END OF SESSION:  PT End of Session - 04/18/24 0800     Visit Number 2    Number of Visits 17    Date for PT Re-Evaluation 06/04/24    Authorization Type UHC MCR    PT Start Time 0800    PT Stop Time 0844    PT Time Calculation (min) 44 min    Activity Tolerance Patient tolerated treatment well    Behavior During Therapy [facility] for tasks assessed/performed           Past Medical History:  Diagnosis Date   Abdominal abscess 11/30/2015   Anemia in chronic illness 12/16/2015   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   Eczema    GERD (gastroesophageal reflux disease)    Hx of radiation therapy 11/11/15- 01/05/2016   Base of Tongue and bilateral neck   Hx of radiation therapy 03/14/16- 03/23/16   Left Lower Lung   Hyperlipidemia    Hypertension    no meds   Infection with methicillin-resistant Staphylococcus aureus (MRSA) 11/30/2015   Intractable nausea and vomiting 11/13/2015   Laceration 04/2014   around R ear, for falling out of bed & hitting nightstand   Lung cancer (HCC) dx'd 01/2016   Nausea without vomiting 12/16/2015   Status post dilation of esophageal narrowing    Thalassemia minor    Throat cancer (HCC)    Throat pain in adult 12/29/2015   Past Surgical History:  Procedure Laterality Date   ABDOMINAL HYSTERECTOMY  1990   partial   DIRECT LARYNGOSCOPY N/A 10/28/2015   Procedure: DIRECT LARYNGOSCOPY WITH BIOPSY;  Surgeon: Marlyce Finer, MD;  Location[facility] OR;  Service: ENT;  Laterality: N/A;   DIRECT LARYNGOSCOPY N/A 03/13/2017   Procedure: DIRECT LARYNGOSCOPY;  Surgeon: Finer Marlyce, MD;  Location: [facility];  Service: ENT;  Laterality: N/A;   DIRECT LARYNGOSCOPY N/A 01/18/2018   Procedure: DIRECT LARYNGOSCOPY;  Surgeon: Finer Marlyce, MD;  Location: [facility] OR;  Service: ENT;  Laterality: N/A;    ESOPHAGOSCOPY N/A 10/28/2015   Procedure: ESOPHAGOSCOPY;  Surgeon: Marlyce Finer, MD;  Location: [facility] OR;  Service: ENT;  Laterality: N/A;   ESOPHAGOSCOPY WITH DILITATION N/A 03/13/2017   Procedure: ESOPHAGOSCOPY WITH DILITATION;  Surgeon: Finer Marlyce, MD;  Location: [facility];  Service: ENT;  Laterality: N/A;   ESOPHAGOSCOPY WITH DILITATION N/A 01/18/2018   Procedure: ESOPHAGOSCOPY WITH DILITATION POSSIBLE BOTOX ;  Surgeon: Finer Marlyce, MD;  Location: [facility] OR;  Service: ENT;  Laterality: N/A;   INCONTINENCE SURGERY  1990, [phone]   IR CV LINE INJECTION  06/28/2018   IR CV LINE INJECTION  07/23/2018   IR REMOVAL TUN ACCESS W/ PORT W/O FL MOD SED  03/21/2019   IR REMOVE CV FIBRIN SHEATH  07/23/2018   IR REPLACE G-TUBE SIMPLE WO FLUORO  06/19/2017   IR REPLACE G-TUBE SIMPLE WO FLUORO  09/05/2017   IR REPLACE G-TUBE SIMPLE WO FLUORO  12/21/2017   IR REPLC GASTRO/COLONIC TUBE PERCUT W/FLUORO  07/02/2018   IR US  GUIDE VASC ACCESS RIGHT  07/23/2018   LARYNGOSCOPY AND BRONCHOSCOPY N/A 10/28/2015   Procedure: BRONCHOSCOPY;  Surgeon: Marlyce Finer, MD;  Location: [facility] OR;  Service: ENT;  Laterality: N/A;   MULTIPLE EXTRACTIONS WITH ALVEOLOPLASTY N/A 10/28/2015   Procedure: Extraction of tooth #'s 2-12, 14,15,17,18,20-29, 31 with alveoloplasy and mandibular left torus reduction;  Surgeon: Ronald F Kulinski, DDS;  Location: [facility] OR;  Service: Oral Surgery;  Laterality: N/A;   port-a-cath insertion     RECTOCELE REPAIR     TONSILLECTOMY     as a child   UPPER GASTROINTESTINAL ENDOSCOPY     urocele     correction surgery   Patient Active Problem List   Diagnosis Date Noted   Syncope 04/28/2022   Urinary incontinence 04/28/2022   Subclinical hypothyroidism 04/05/2021   Lipoma 04/05/2021   Healthcare maintenance 04/05/2021   Elevated blood pressure reading 04/05/2021   Thyroid  nodule 02/28/2019   Acquired hypothyroidism 03/19/2018   Metastasis to lung (HCC) 09/02/2016    Pancytopenia, acquired (HCC) 09/02/2016   Preventive measure 09/02/2016   Lymphedema in adult patient 05/13/2016   Malignant neoplasm of lower lobe of left lung (HCC) 02/17/2016   Malnutrition of moderate degree 01/04/2016   Goals of care, counseling/discussion    Dysphagia 12/31/2015   Mucositis due to radiation therapy 12/31/2015   Odynophagia    Throat pain in adult 12/29/2015   Anemia in chronic illness 12/16/2015   Intractable nausea and vomiting 11/13/2015   Gastritis, acute 11/13/2015   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   Lipoma of axilla 03/21/2013   Sleep disorder 03/17/2013   Health care maintenance 03/17/2013   Health maintenance examination 10/17/2011   Thalassemia 10/17/2011   Essential hypertension 10/17/2011   Hyperlipidemia 10/17/2011   Headache 10/17/2011    PCP: Nicholas Bar, MD  REFERRING PROVIDER: Teressa Rainell BROCKS, DO   REFERRING DIAG: 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  Neck 8/10, hips and knees no pain today.   EVAL- Pt reports pain is the worst in the neck and is worse in the AM.  Pain is mostly in the back of the neck starting a few years ago  with no specific onset. Pain improves after being up and moving around for a while. She notes holding up to do prolonged activities  is hard for example mowing the lawn or reading.    Hips and knees have been giving more issues about 6 weeks ago beginning of June. She reports nothing started this.  She reports about week ago the hip and knee pain has resolved, with some occasional L knee residual intermittent soreness, but notes the pain in her neck is the worst.   PERTINENT HISTORY:  Hx of Cx, see PMHx  PAIN:  Are you having pain?  Neck Yes: NPRS scale: 3/10 currently, at worst 10/10 Last 24 hours: 2-10/10 Pain location: back of the neck  Pain description: ache, pulling  Aggravating factors: getting out of bed morning, prolonged looking down atactivities, lifting head Relieving factors: Tylenol ,   Hips / knees Yes: NPRS scale: 0/10 Last 24 hours: 0/10 Pain location: along the side of the hip Pain description: ache Aggravating factors: more activity / movement Relieving factors: Tylenol    PRECAUTIONS: Other: hx of cancer  RED FLAGS: None   WEIGHT BEARING RESTRICTIONS: No  FALLS:  Has patient fallen in last 6 months? Yes. Number of falls 3  LIVING ENVIRONMENT: Lives with: lives with their daughter Lives in: House/apartment Stairs: Yes: Internal: 12 steps; on left going up Has following equipment at home: None  OCCUPATION: retired  PLOF: Independent  PATIENT GOALS: be able to move again, get stuff done without relying on others.    OBJECTIVE:  Note: Objective measures were completed at Evaluation unless otherwise noted.  DIAGNOSTIC FINDINGS:  X-ray Cervical spine 03/26/24 IMPRESSION: Moderate degenerative disc disease at C6-7 and milder degenerative disc disease at C5-6.  X-ray bil knee 03/26/24 IMPRESSION: Mild degenerative changes of the knees.  PATIENT SURVEYS:  NDI:  NECK DISABILITY INDEX  Date: 7/15 Score  Pain intensity 3 = The pain is fairly severe at the moment  2. Personal care (washing, dressing, etc.) 0 = I can look after myself normally without causing extra pain  3. Lifting 5 = I cannot lift or carry anything   4.  Reading 2 =  I can read as much as I want with moderate pain in my neck  5. Headaches 0 = I have no headaches at all  6. Concentration 0 =  I can concentrate fully when I want to with no difficulty  7. Work 2 = I can do most of my usual work, but no more  8. Driving 0 = I can drive my car without any neck pain  9. Sleeping 5 =  My sleep is completely disturbed (5-7 hrs sleepless)   10. Recreation 5 = I can't do any recreation activities at all  Total 22/50   Minimum Detectable Change (90% confidence): 5 points or 10% points  COGNITION: Overall cognitive status: Within functional limits for tasks assessed     SENSATION: Not tested   POSTURE: rounded shoulders, forward head, increased thoracic kyphosis, and    PALPATION: TTP with multiple trigger points along the bil cervicothoracic paraspinals with R>L, bil upper trap tightness, levator scapulae tenderness and sub-occipital tightness, increaed thoracic kyphosis  CERVICAL ROM:   Active ROM A/PROM (deg) 04/09/2024  Flexion 40  Extension 20  Right lateral flexion 1  Left lateral flexion 10  Right rotation 55  Left rotation 55   (Blank rows = not tested)  Note: resting postioning is 12 degrees of flexion          Concordant sx noted with end range flexion.   UE ROM:  Active ROM Right 04/09/2024 Left 04/09/2024  Shoulder  flexion [facility]  Shoulder extension [facility]  Shoulder abduction [facility]  Shoulder adduction [facility]  Shoulder extension    Shoulder internal rotation [facility]  Shoulder external rotation [facility]  Elbow flexion    Elbow extension    Wrist flexion    Wrist extension    Wrist ulnar deviation    Wrist radial deviation    Wrist pronation    Wrist supination     (Blank rows = not tested)  UE MMT:  MMT Right 04/09/2024 Left 04/09/2024  Shoulder flexion 4 4-  Shoulder extension 4 4-  Shoulder abduction 4 4-  Shoulder adduction 4 4  Shoulder extension    Shoulder internal rotation 4 4-  Shoulder  external rotation 4 4-  Middle trapezius    Lower trapezius    Elbow flexion    Elbow extension    Wrist flexion    Wrist extension    Wrist ulnar deviation    Wrist radial deviation    Wrist pronation    Wrist supination    Grip strength     (Blank rows = not tested)  CERVICAL SPECIAL TESTS:  Cranial cervical flexion test: able to hold 20 seconds    LOWER EXTREMITY ROM:     Active  Right eval Left eval  Hip flexion    Hip extension    Hip abduction    Hip adduction    Hip internal rotation    Hip external rotation    Knee flexion    Knee extension    Ankle dorsiflexion    Ankle plantarflexion    Ankle inversion    Ankle eversion     (Blank rows = not tested)  LOWER EXTREMITY MMT:    MMT Right eval Left eval  Hip flexion    Hip extension    Hip abduction    Hip adduction    Hip internal rotation    Hip external rotation    Knee flexion    Knee extension    Ankle dorsiflexion    Ankle plantarflexion    Ankle inversion    Ankle eversion     (Blank rows = not tested)    GAIT: Distance walked: from waiting room to tx room Assistive device utilized: None Level of assistance: Complete Independence Comments: postural sway noted with limited trunk rotation   TREATMENT :  [facility] Adult PT Treatment:                                                DATE: 04-18-24 Therapeutic Exercise:  Standing Cervical Retraction  VC and TC 2 x 10 Supine Chin Tuck with Towel   10 x for 10 sec hold Supine DNF Liftoffs Eccentrically   1 set 5 x 5 sec hold  to fatigue Thoracic Extension self mobs with pink foam roller 10 x 5 sec hold Seated Scapular Retraction  2 x 10 seconds hold ER bil shoulder with RTB and towel rolls with shld adduction 3 x 10 replacing seated scapular rotation Seated UT stretch 2 x 30 sec bil Seated LS stretch2 x 30 sec bil  Self Care: Seated and standing posture with handout Sleep positioning Reading positioning and helps  [facility] Adult PT  Treatment:  DATE: 04/09/24 MTPR along the R cervical paraspinals/ sub-occipitals Seated upper trap stretch 1 x 30 sec Supine chin tuck 1 x 5 Discussed postural efficiency Provided initial HEP                                                                                                                                  PATIENT EDUCATION:  Education details: evaluation findings, POC, goals, HEP with proper form/ rationale.  Person educated: Patient Education method: Explanation, Verbal cues, and Handouts Education comprehension: verbalized understanding  HOME EXERCISE PROGRAM: Access Code: [email or web address] URL: [email or web address] Date: 04/18/2024 Prepared by: Graydon Dingwall  Exercises - Seated or Standing Cervical Retraction  - 1 x daily - 7 x weekly - 2 sets - 10 reps - 5 sec hold - Supine Chin Tuck with Towel  - 1 x daily - 7 x weekly - 2 sets - 10 reps - 10 hold - Supine DNF Liftoffs  - 1 x daily - 7 x weekly - 3 sets - 5 reps - 5-10 hold - Thoracic Extension Mobilization with Noodle  - 1 x daily - 7 x weekly - 2 sets - 10 reps - 5 seconds hold - Seated Upper Trapezius Stretch  - 1 x daily - 7 x weekly - 2 sets - 2 reps - 30 seconds hold - Gentle Levator Scapulae Stretch  - 1 x daily - 7 x weekly - 2 sets - 2 reps - 30 sec  hold - Shoulder External Rotation and Scapular Retraction with Resistance  - 1 x daily - 7 x weekly - 3 sets - 10 reps   ASSESSMENT:  CLINICAL IMPRESSION: Rodolfo enters clinic with 8/10 pain in neck and no pain in hip and knees.  Pt complains she has trouble even lifting her neck in morning.  Education on posture and sleep aids as well as modified reading postures with better alignment for neck /spine with explanation and problem solving reading in bed vs sitting in chair.  Pt will continue to progress. Pt shows marked weakness in DNF muscles and started with eccentric flexion exercises today to build  strength. Will continue to progress as pt is able.   EVAL-Patient is a 78 y.o. F who was seen today for physical therapy evaluation and treatment for referral dx of bil hip OA, bil knee pain, and neck pain. Mrs Pew reports her hips/ knees started bothering her 6 weeks ago with no specific onset but has since reduced activity and noted she currently hasn't had any pain for over a week, with the exception of intermittent L knee pain. She reports her CC is Neck pain and limited activity as a result that is worst in the AM or with prolonged positioning/ activities. She demonstrates limited cervical extension and sidebending and is a resting position of 12 degrees of flexion. She exhibits a classic upper cross syndrome with a forward head posture and forward rolled shoulders and significant  thoracic kyphosis which potentially could be attributed partially to her PMhx of throat and lung cx. She would benefit from physical therapy to reduce neck pain, strengthening of the posterior chain, maximize cervical and thoracic mobility, maximize postural efficiency and her overall function by addressing the deficits listed .  OBJECTIVE IMPAIRMENTS: decreased endurance, decreased ROM, increased fascial restrictions, increased muscle spasms, improper body mechanics, postural dysfunction, and pain.   ACTIVITY LIMITATIONS: carrying, lifting, and reading, yard work   PARTICIPATION LIMITATIONS: community activity and yard work  PERSONAL FACTORS: Age, Past/current experiences, and 1-2 comorbidities: hx of Cancer in both throat/ lung are also affecting patient's functional outcome.   REHAB POTENTIAL: Good  CLINICAL DECISION MAKING: Evolving/moderate complexity  EVALUATION COMPLEXITY: Moderate   GOALS: Goals reviewed with patient? Yes  SHORT TERM GOALS: Target date: 05/07/2024  Pt to be IND with initial HEP for therapeutic progression Baseline: Goal status: INITIAL  2.  Pt to verbalize/ demo efficient  posture in sitting/ standing to reduce stress/ strain on her neck/ thoracic spine.  Baseline:  Goal status: INITIAL  3.  Pt to improve her cervical resting postion to </= 8 degrees of flexion to reduce stress on cervical paraspinal/ sub-occipitals  Baseline:  Goal status: INITIAL  4.  Pt to report pain to </= 7/10 at max to demo improving condition Baseline:  Goal status: INITIAL   LONG TERM GOALS: Target date: 06/04/2024  Improve cervical sidebending to >/= 15 degrees bil and maintain current cervical mobility with </= 2/10 max pain for safety with ADLs and driving Baseline:  Goal status: INITIAL  2.  Pt to be able to return to yard work, reading for  >/= 45 min reporting pain as </= slight (per the NDI) for pt's personal goals.  Baseline:  Goal status: INITIAL  3.  Pt to be able to lift/ carrying items with report of minimal pain for returning to independent activity and maximize QOL Baseline:  Goal status: INITIAL  4.  Pt to be able to walk/ stand and navigate in home steps with no report of limitations with strength or feeling of balance for safety.  Baseline:  Goal status: INITIAL  5.  Improve NDI to </= 10/50 to demo a measureable improvement in her function.  Baseline:  Goal status: INITIAL  6.  Pt to be IND with all HEP and is able to maintain and progress their current LOF IND.  Baseline:  Goal status: INITIAL  PLAN:  PT FREQUENCY: 1-2x/week  PT DURATION: 8 weeks  PLANNED INTERVENTIONS: 97110-Therapeutic exercises, 97530- Therapeutic activity, [id number]- Neuromuscular re-education, 97535- Self Care, [id number]- Manual therapy, [id number]- Gait training, [phone]- Traction (mechanical), [phone] (1-2 muscles), 20561 (3+ muscles)- Dry Needling, Taping, Spinal mobilization, Cryotherapy, and Moist heat.  PLAN FOR NEXT SESSION: review/ update HEP PRN, posterior chain activation, pec stretch,STW along cervical paraspinals.   Graydon Dingwall, PT, ATRIC Certified Exercise Expert for the  Aging Adult  04/18/24 10:42 AM Phone: [phone] Fax: [phone]     Date of referral: 03/26/24 Referring provider: Teressa Rainell BROCKS, DO  Referring diagnosis? 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 (if different than referring diagnosis) Same as above with added Abnormal posture R29.3  What was this (referring dx) caused by? Ongoing Issue and Unspecified  Nature of Condition: Chronic (continuous duration > 3 months)   Laterality: Both  Current Functional Measure Score: Neck Index 22/50  Objective measurements identify impairments en they are compared to  normal values, the uninvolved extremity, and prior level of function.  [x]  Yes  []  No  Objective assessment of functional ability: Severe functional limitations   Briefly describe symptoms: Continued Neck pain that has been present for years limiting prolonged activities requiring   How did symptoms start: insidious onset.  Average pain intensity:  Last 24 hours: 2 - 10/10  Past week: 10/10  How often does the pt experience symptoms? Constantly  How much have the symptoms interfered with usual daily activities? Extremely  How has condition changed since care began at this facility? Much worse  In general, how is the patients overall health? Good   BACK PAIN (STarT Back Screening Tool) No

## 2024-04-18 ENCOUNTER — Ambulatory Visit: Admitting: Physical Therapy

## 2024-04-18 ENCOUNTER — Encounter: Payer: Self-pay | Admitting: Physical Therapy

## 2024-04-18 DIAGNOSIS — M25561 Pain in right knee: Secondary | ICD-10-CM | POA: Diagnosis not present

## 2024-04-18 DIAGNOSIS — M25551 Pain in right hip: Secondary | ICD-10-CM | POA: Diagnosis not present

## 2024-04-18 DIAGNOSIS — R293 Abnormal posture: Secondary | ICD-10-CM | POA: Diagnosis not present

## 2024-04-18 DIAGNOSIS — M542 Cervicalgia: Secondary | ICD-10-CM

## 2024-04-18 DIAGNOSIS — M25562 Pain in left knee: Secondary | ICD-10-CM | POA: Diagnosis not present

## 2024-04-18 DIAGNOSIS — M160 Bilateral primary osteoarthritis of hip: Secondary | ICD-10-CM | POA: Diagnosis not present

## 2024-04-18 DIAGNOSIS — M25552 Pain in left hip: Secondary | ICD-10-CM | POA: Diagnosis not present

## 2024-04-18 NOTE — Patient Instructions (Signed)
 Posture Tips DO: - stand tall and erect - keep chin tucked in - keep head and shoulders in alignment - check posture regularly in mirror or large window - pull head back against headrest in car seat;  Change your position often.  Sit with lumbar support. DON'T: - slouch or slump while watching TV or reading - sit, stand or lie in one position  for too long;  Sitting is especially hard on the spine so if you sit at a desk/use the computer, then stand up often!   Copyright  VHI. All rights reserved.  Posture - Standing   Good posture is important. Avoid slouching and forward head thrust. Maintain curve in low back and align ears over shoul- ders, hips over ankles.  Pull your belly button in toward your back bone.   Copyright  VHI. All rights reserved.  Posture - Sitting   Sit upright, head facing forward. Try using a roll to support lower back. Keep shoulders relaxed, and avoid rounded back. Keep hips level with knees. Avoid crossing legs for long periods.   Copyright  VHI. All rights reserved.   Graydon Dingwall, PT, ATRIC Certified Exercise Expert for the Aging Adult  04/18/24 8:11 AM Phone: [phone] Fax: [phone]

## 2024-04-23 ENCOUNTER — Ambulatory Visit: Admitting: Physical Therapy

## 2024-04-23 ENCOUNTER — Encounter: Payer: Self-pay | Admitting: Physical Therapy

## 2024-04-23 DIAGNOSIS — M25552 Pain in left hip: Secondary | ICD-10-CM | POA: Diagnosis not present

## 2024-04-23 DIAGNOSIS — M160 Bilateral primary osteoarthritis of hip: Secondary | ICD-10-CM | POA: Diagnosis not present

## 2024-04-23 DIAGNOSIS — M542 Cervicalgia: Secondary | ICD-10-CM

## 2024-04-23 DIAGNOSIS — M25551 Pain in right hip: Secondary | ICD-10-CM

## 2024-04-23 DIAGNOSIS — R293 Abnormal posture: Secondary | ICD-10-CM | POA: Diagnosis not present

## 2024-04-23 DIAGNOSIS — M25561 Pain in right knee: Secondary | ICD-10-CM | POA: Diagnosis not present

## 2024-04-23 DIAGNOSIS — M25562 Pain in left knee: Secondary | ICD-10-CM | POA: Diagnosis not present

## 2024-04-23 NOTE — Therapy (Signed)
 OUTPATIENT PHYSICAL THERAPY CERVICAL  TREATMENT   Patient Name: Debra Farrell MRN: [id number] DOB:08/04/1946, 78 y.o., female Today's Date: 04/23/2024  END OF SESSION:  PT End of Session - 04/23/24 0912     Visit Number 3    Number of Visits 17    Date for PT Re-Evaluation 06/04/24    Authorization Type [employer] MCR    PT Start Time 0912    PT Stop Time 1008    PT Time Calculation (min) 56 min    Activity Tolerance Patient tolerated treatment well    Behavior During Therapy [facility] for tasks assessed/performed            Past Medical History:  Diagnosis Date   Abdominal abscess 11/30/2015   Anemia in chronic illness 12/16/2015   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   Eczema    GERD (gastroesophageal reflux disease)    Hx of radiation therapy 11/11/15- 01/05/2016   Base of Tongue and bilateral neck   Hx of radiation therapy 03/14/16- 03/23/16   Left Lower Lung   Hyperlipidemia    Hypertension    no meds   Infection with methicillin-resistant Staphylococcus aureus (MRSA) 11/30/2015   Intractable nausea and vomiting 11/13/2015   Laceration 04/2014   around R ear, for falling out of bed & hitting nightstand   Lung cancer (HCC) dx'd 01/2016   Nausea without vomiting 12/16/2015   Status post dilation of esophageal narrowing    Thalassemia minor    Throat cancer (HCC)    Throat pain in adult 12/29/2015   Past Surgical History:  Procedure Laterality Date   ABDOMINAL HYSTERECTOMY  1990   partial   DIRECT LARYNGOSCOPY N/A 10/28/2015   Procedure: DIRECT LARYNGOSCOPY WITH BIOPSY;  Surgeon: Marlyce Finer, MD;  Location: [facility] OR;  Service: ENT;  Laterality: N/A;   DIRECT LARYNGOSCOPY N/A 03/13/2017   Procedure: DIRECT LARYNGOSCOPY;  Surgeon: Finer Marlyce, MD;  Location: [facility];  Service: ENT;  Laterality: N/A;   DIRECT LARYNGOSCOPY N/A 01/18/2018   Procedure: DIRECT LARYNGOSCOPY;  Surgeon: Finer Marlyce, MD;  Location: [facility] OR;  Service: ENT;  Laterality: N/A;    ESOPHAGOSCOPY N/A 10/28/2015   Procedure: ESOPHAGOSCOPY;  Surgeon: Marlyce Finer, MD;  Location: [facility] OR;  Service: ENT;  Laterality: N/A;   ESOPHAGOSCOPY WITH DILITATION N/A 03/13/2017   Procedure: ESOPHAGOSCOPY WITH DILITATION;  Surgeon: Finer Marlyce, MD;  Location: [facility];  Service: ENT;  Laterality: N/A;   ESOPHAGOSCOPY WITH DILITATION N/A 01/18/2018   Procedure: ESOPHAGOSCOPY WITH DILITATION POSSIBLE BOTOX ;  Surgeon: Finer Marlyce, MD;  Location: [facility] OR;  Service: ENT;  Laterality: N/A;   INCONTINENCE SURGERY  1990, [phone]   IR CV LINE INJECTION  06/28/2018   IR CV LINE INJECTION  07/23/2018   IR REMOVAL TUN ACCESS W/ PORT W/O FL MOD SED  03/21/2019   IR REMOVE CV FIBRIN SHEATH  07/23/2018   IR REPLACE G-TUBE SIMPLE WO FLUORO  06/19/2017   IR REPLACE G-TUBE SIMPLE WO FLUORO  09/05/2017   IR REPLACE G-TUBE SIMPLE WO FLUORO  12/21/2017   IR REPLC GASTRO/COLONIC TUBE PERCUT W/FLUORO  07/02/2018   IR US  GUIDE VASC ACCESS RIGHT  07/23/2018   LARYNGOSCOPY AND BRONCHOSCOPY N/A 10/28/2015   Procedure: BRONCHOSCOPY;  Surgeon: Marlyce Finer, MD;  Location: [facility] OR;  Service: ENT;  Laterality: N/A;   MULTIPLE EXTRACTIONS WITH ALVEOLOPLASTY N/A 10/28/2015   Procedure: Extraction of tooth #'s 2-12, 14,15,17,18,20-29, 31 with alveoloplasy and mandibular left torus reduction;  Surgeon: Ronald F Kulinski, DDS;  Location: [facility] OR;  Service: Oral Surgery;  Laterality: N/A;   port-a-cath insertion     RECTOCELE REPAIR     TONSILLECTOMY     as a child   UPPER GASTROINTESTINAL ENDOSCOPY     urocele     correction surgery   Patient Active Problem List   Diagnosis Date Noted   Syncope 04/28/2022   Urinary incontinence 04/28/2022   Subclinical hypothyroidism 04/05/2021   Lipoma 04/05/2021   Healthcare maintenance 04/05/2021   Elevated blood pressure reading 04/05/2021   Thyroid  nodule 02/28/2019   Acquired hypothyroidism 03/19/2018   Metastasis to lung (HCC) 09/02/2016    Pancytopenia, acquired (HCC) 09/02/2016   Preventive measure 09/02/2016   Lymphedema in adult patient 05/13/2016   Malignant neoplasm of lower lobe of left lung (HCC) 02/17/2016   Malnutrition of moderate degree 01/04/2016   Goals of care, counseling/discussion    Dysphagia 12/31/2015   Mucositis due to radiation therapy 12/31/2015   Odynophagia    Throat pain in adult 12/29/2015   Anemia in chronic illness 12/16/2015   Intractable nausea and vomiting 11/13/2015   Gastritis, acute 11/13/2015   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   Lipoma of axilla 03/21/2013   Sleep disorder 03/17/2013   Health care maintenance 03/17/2013   Health maintenance examination 10/17/2011   Thalassemia 10/17/2011   Essential hypertension 10/17/2011   Hyperlipidemia 10/17/2011   Headache 10/17/2011    PCP: Nicholas Bar, MD  REFERRING PROVIDER: Teressa Rainell BROCKS, DO   REFERRING DIAG: Bilateral primary osteoarthritis of hip [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], Neck pain [M54.2], Acute pain of both knees [M25.561, M25.562]   Rationale for Evaluation and Treatment: Rehabilitation  THERAPY DIAG:  Acute pain of both knees  Pain of both hip joints  Cervicalgia  Abnormal posture  ONSET DATE: Neck pain intermittent couple of years  SUBJECTIVE:                                                                                                                                                                                           SUBJECTIVE STATEMENT:  04/23/2024 After the last session I had a fall down about 8 steps, I tried to stop but could tell I was going to fall and tried to grab on to the hand rail which turned me around and I fell backward, didn't hit my head but it bounced up and down. Only issue I had was I twisted my ankle but its doing better now.   For the neck I am alittle sore from doing some trimming of the bushes and alittle sore from that.   EVAL-  Pt reports pain is the worst in the neck and is worse in  the AM.  Pain is mostly in the back of the neck starting a few years ago  with no specific onset. Pain improves after being up and moving around for a while. She notes holding up to do prolonged activities is hard for example mowing the lawn or reading.   Hips and knees have been giving more issues about 6 weeks ago beginning of June. She reports nothing started this.  She reports about week ago the hip and knee pain has resolved, with some occasional L knee residual intermittent soreness, but notes the pain in her neck is the worst.   PERTINENT HISTORY:  Hx of Cx, see PMHx  PAIN:  Are you having pain?  Neck Yes: NPRS scale: 4-5/10 Last 24 hours: 2-10/10 Pain location: back of the neck  Pain description: ache, pulling  Aggravating factors: getting out of bed morning, prolonged looking down atactivities, lifting head Relieving factors: Tylenol ,   Hips / knees Yes: NPRS scale: 0/10 Last 24 hours: 0/10 Pain location: along the side of the hip Pain description: ache Aggravating factors: more activity / movement Relieving factors: Tylenol    PRECAUTIONS: Other: hx of cancer  RED FLAGS: None   WEIGHT BEARING RESTRICTIONS: No  FALLS:  Has patient fallen in last 6 months? Yes. Number of falls 3  LIVING ENVIRONMENT: Lives with: lives with their daughter Lives in: House/apartment Stairs: Yes: Internal: 12 steps; on left going up Has following equipment at home: None  OCCUPATION: retired  PLOF: Independent  PATIENT GOALS: be able to move again, get stuff done without relying on others.    OBJECTIVE:  Note: Objective measures were completed at Evaluation unless otherwise noted.  DIAGNOSTIC FINDINGS:  X-ray Cervical spine 03/26/24 IMPRESSION: Moderate degenerative disc disease at C6-7 and milder degenerative disc disease at C5-6.  X-ray bil knee 03/26/24 IMPRESSION: Mild degenerative changes of the knees.  PATIENT SURVEYS:  NDI:  NECK DISABILITY INDEX  Date: 7/15 Score   Pain intensity 3 = The pain is fairly severe at the moment  2. Personal care (washing, dressing, etc.) 0 = I can look after myself normally without causing extra pain  3. Lifting 5 = I cannot lift or carry anything   4. Reading 2 =  I can read as much as I want with moderate pain in my neck  5. Headaches 0 = I have no headaches at all  6. Concentration 0 =  I can concentrate fully when I want to with no difficulty  7. Work 2 = I can do most of my usual work, but no more  8. Driving 0 = I can drive my car without any neck pain  9. Sleeping 5 =  My sleep is completely disturbed (5-7 hrs sleepless)   10. Recreation 5 = I can't do any recreation activities at all  Total 22/50   Minimum Detectable Change (90% confidence): 5 points or 10% points  COGNITION: Overall cognitive status: Within functional limits for tasks assessed     SENSATION: Not tested   POSTURE: rounded shoulders, forward head, increased thoracic kyphosis, and    PALPATION: TTP with multiple trigger points along the bil cervicothoracic paraspinals with R>L, bil upper trap tightness, levator scapulae tenderness and sub-occipital tightness, increaed thoracic kyphosis  CERVICAL ROM:   Active ROM A/PROM (deg) 04/09/2024  Flexion 40  Extension 20  Right lateral flexion 1  Left lateral flexion 10  Right rotation  55  Left rotation 55   (Blank rows = not tested)  Note: resting postioning is 12 degrees of flexion          Concordant sx noted with end range flexion.   UE ROM:  Active ROM Right 04/09/2024 Left 04/09/2024  Shoulder flexion [facility]  Shoulder extension [facility]  Shoulder abduction [facility] WFL  Shoulder adduction [facility]  Shoulder extension    Shoulder internal rotation [facility] WFL  Shoulder external rotation [facility] WFL  Elbow flexion    Elbow extension    Wrist flexion    Wrist extension    Wrist ulnar deviation    Wrist radial deviation    Wrist pronation    Wrist supination     (Blank rows = not  tested)  UE MMT:  MMT Right 04/09/2024 Left 04/09/2024  Shoulder flexion 4 4-  Shoulder extension 4 4-  Shoulder abduction 4 4-  Shoulder adduction 4 4  Shoulder extension    Shoulder internal rotation 4 4-  Shoulder external rotation 4 4-  Middle trapezius    Lower trapezius    Elbow flexion    Elbow extension    Wrist flexion    Wrist extension    Wrist ulnar deviation    Wrist radial deviation    Wrist pronation    Wrist supination    Grip strength     (Blank rows = not tested)  CERVICAL SPECIAL TESTS:  Cranial cervical flexion test: able to hold 20 seconds    LOWER EXTREMITY ROM:     Active  Right eval Left eval  Hip flexion    Hip extension    Hip abduction    Hip adduction    Hip internal rotation    Hip external rotation    Knee flexion    Knee extension    Ankle dorsiflexion    Ankle plantarflexion    Ankle inversion    Ankle eversion     (Blank rows = not tested)  LOWER EXTREMITY MMT:    MMT Right eval Left eval  Hip flexion    Hip extension    Hip abduction    Hip adduction    Hip internal rotation    Hip external rotation    Knee flexion    Knee extension    Ankle dorsiflexion    Ankle plantarflexion    Ankle inversion    Ankle eversion     (Blank rows = not tested)    GAIT: Distance walked: from waiting room to tx room Assistive device utilized: None Level of assistance: Complete Independence Comments: postural sway noted with limited trunk rotation   TREATMENT :  [facility] Adult PT Treatment:                                                DATE: 04/23/24 MTPR along the sub-occipitals and cervical paraspinals Supine chin tuck 1 x 10 holding 5 sec ea. VC to work into the movement UBE L4  4 min (backward only) Foam roll routnine (using 2 rolled towels) Ceiling punches, horizontal abd/ add, alternating ceiling punches, x - Y and  Supine scapular retract with bil ER 2 x 12 with RTB Seated horizontal abd 2 x 12 with RTB while  maintaining efficient posture Reviewed and updated HEP for supine pec stretch and horizontal abd  [facility] Adult  PT Treatment:                                                DATE: 04-18-24 Therapeutic Exercise:  Standing Cervical Retraction  VC and TC 2 x 10 Supine Chin Tuck with Towel   10 x for 10 sec hold Supine DNF Liftoffs Eccentrically   1 set 5 x 5 sec hold  to fatigue Thoracic Extension self mobs with pink foam roller 10 x 5 sec hold Seated Scapular Retraction  2 x 10 seconds hold ER bil shoulder with RTB and towel rolls with shld adduction 3 x 10 replacing seated scapular rotation Seated UT stretch 2 x 30 sec bil Seated LS stretch2 x 30 sec bil  Self Care: Seated and standing posture with handout Sleep positioning Reading positioning and helps  [facility] Adult PT Treatment:                                                DATE: 04/09/24 MTPR along the R cervical paraspinals/ sub-occipitals Seated upper trap stretch 1 x 30 sec Supine chin tuck 1 x 5 Discussed postural efficiency Provided initial HEP                                                                                                                                  PATIENT EDUCATION:  Education details: evaluation findings, POC, goals, HEP with proper form/ rationale.  Person educated: Patient Education method: Explanation, Verbal cues, and Handouts Education comprehension: verbalized understanding  HOME EXERCISE PROGRAM: Access Code: [email or web address] URL: [email or web address] Date: 04/23/2024 Prepared by: Joneen Fresh  Exercises - Seated or Standing Cervical Retraction  - 1 x daily - 7 x weekly - 2 sets - 10 reps - 5 sec hold - Supine Chin Tuck with Towel  - 1 x daily - 7 x weekly - 2 sets - 10 reps - 10 hold - Supine DNF Liftoffs  - 1 x daily - 7 x weekly - 3 sets - 5 reps - 5-10 hold - Thoracic Extension Mobilization with Noodle  - 1 x daily - 7 x weekly - 2 sets - 10 reps - 5 seconds hold - Seated  Upper Trapezius Stretch  - 1 x daily - 7 x weekly - 2 sets - 2 reps - 30 seconds hold - Gentle Levator Scapulae Stretch  - 1 x daily - 7 x weekly - 2 sets - 2 reps - 30 sec  hold - Shoulder External Rotation and Scapular Retraction with Resistance  - 1 x daily - 7 x weekly - 3 sets - 10 reps - Seated Shoulder Horizontal Abduction with Resistance  - 1 x  daily - 7 x weekly - 2 sets - 12 reps - Supine Pectoralis Stretch  - 2-3 x daily - 7 x weekly - 2 sets - 2 reps - 30-60 sec hold   ASSESSMENT:  CLINICAL IMPRESSION: 04/23/2024 Kynnedi reports to PT today noting 4/10 pain in the neck. She does report having a fall down 8 steps occurring last Friday which occurred as a result of going to quickly and she was unable to slow down, She reported she didn't hit her head and only twisted her R ankle but she reported it is better today. Continued working on thoracic extension while focusing on scapular mobility which she did well with, continued strengthening with emphasis on the posterior chain. End of session she noted feeling better and had more movement.    EVAL- Patient is a 78 y.o. F who was seen today for physical therapy evaluation and treatment for referral dx of bil hip OA, bil knee pain, and neck pain. Mrs Okun reports her hips/ knees started bothering her 6 weeks ago with no specific onset but has since reduced activity and noted she currently hasn't had any pain for over a week, with the exception of intermittent L knee pain. She reports her CC is Neck pain and limited activity as a result that is worst in the AM or with prolonged positioning/ activities. She demonstrates limited cervical extension and sidebending and is a resting position of 12 degrees of flexion. She exhibits a classic upper cross syndrome with a forward head posture and forward rolled shoulders and significant thoracic kyphosis which potentially could be attributed partially to her PMhx of throat and lung cx. She would benefit from  physical therapy to reduce neck pain, strengthening of the posterior chain, maximize cervical and thoracic mobility, maximize postural efficiency and her overall function by addressing the deficits listed .  OBJECTIVE IMPAIRMENTS: decreased endurance, decreased ROM, increased fascial restrictions, increased muscle spasms, improper body mechanics, postural dysfunction, and pain.   ACTIVITY LIMITATIONS: carrying, lifting, and reading, yard work   PARTICIPATION LIMITATIONS: community activity and yard work  PERSONAL FACTORS: Age, Past/current experiences, and 1-2 comorbidities: hx of Cancer in both throat/ lung are also affecting patient's functional outcome.   REHAB POTENTIAL: Good  CLINICAL DECISION MAKING: Evolving/moderate complexity  EVALUATION COMPLEXITY: Moderate   GOALS: Goals reviewed with patient? Yes  SHORT TERM GOALS: Target date: 05/07/2024  Pt to be IND with initial HEP for therapeutic progression Baseline: Goal status: INITIAL  2.  Pt to verbalize/ demo efficient posture in sitting/ standing to reduce stress/ strain on her neck/ thoracic spine.  Baseline:  Goal status: INITIAL  3.  Pt to improve her cervical resting postion to </= 8 degrees of flexion to reduce stress on cervical paraspinal/ sub-occipitals  Baseline:  Goal status: INITIAL  4.  Pt to report pain to </= 7/10 at max to demo improving condition Baseline:  Goal status: INITIAL   LONG TERM GOALS: Target date: 06/04/2024  Improve cervical sidebending to >/= 15 degrees bil and maintain current cervical mobility with </= 2/10 max pain for safety with ADLs and driving Baseline:  Goal status: INITIAL  2.  Pt to be able to return to yard work, reading for  >/= 45 min reporting pain as </= slight (per the NDI) for pt's personal goals.  Baseline:  Goal status: INITIAL  3.  Pt to be able to lift/ carrying items with report of minimal pain for returning to independent activity and maximize QOL Baseline:  Goal status: INITIAL  4.  Pt to be able to walk/ stand and navigate in home steps with no report of limitations with strength or feeling of balance for safety.  Baseline:  Goal status: INITIAL  5.  Improve NDI to </= 10/50 to demo a measureable improvement in her function.  Baseline:  Goal status: INITIAL  6.  Pt to be IND with all HEP and is able to maintain and progress their current LOF IND.  Baseline:  Goal status: INITIAL  PLAN:  PT FREQUENCY: 1-2x/week  PT DURATION: 8 weeks  PLANNED INTERVENTIONS: 97110-Therapeutic exercises, 97530- Therapeutic activity, [id number]- Neuromuscular re-education, 97535- Self Care, [id number]- Manual therapy, [id number]- Gait training, [phone]- Traction (mechanical), [phone] (1-2 muscles), [id number] (3+ muscles)- Dry Needling, Taping, Spinal mobilization, Cryotherapy, and Moist heat.  PLAN FOR NEXT SESSION: review/ update HEP PRN, posterior chain activation, pec stretch,STW along cervical paraspinals.   Chesnee Floren PT, DPT, LAT, ATC  04/23/24  10:15 AM

## 2024-04-24 NOTE — Therapy (Signed)
 OUTPATIENT PHYSICAL THERAPY CERVICAL  TREATMENT   Patient Name: Debra Farrell MRN: [id number] DOB:1946-01-30, 78 y.o., female Today's Date: 04/25/2024  END OF SESSION:  PT End of Session - 04/25/24 0933     Visit Number 4    Number of Visits 17    Date for PT Re-Evaluation 06/04/24    Authorization Type [employer] MCR    Progress Note Due on Visit 10    PT Start Time 0933    PT Stop Time 1016    PT Time Calculation (min) 43 min             Past Medical History:  Diagnosis Date   Abdominal abscess 11/30/2015   Anemia in chronic illness 12/16/2015   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   Eczema    GERD (gastroesophageal reflux disease)    Hx of radiation therapy 11/11/15- 01/05/2016   Base of Tongue and bilateral neck   Hx of radiation therapy 03/14/16- 03/23/16   Left Lower Lung   Hyperlipidemia    Hypertension    no meds   Infection with methicillin-resistant Staphylococcus aureus (MRSA) 11/30/2015   Intractable nausea and vomiting 11/13/2015   Laceration 04/2014   around R ear, for falling out of bed & hitting nightstand   Lung cancer (HCC) dx'd 01/2016   Nausea without vomiting 12/16/2015   Status post dilation of esophageal narrowing    Thalassemia minor    Throat cancer (HCC)    Throat pain in adult 12/29/2015   Past Surgical History:  Procedure Laterality Date   ABDOMINAL HYSTERECTOMY  1990   partial   DIRECT LARYNGOSCOPY N/A 10/28/2015   Procedure: DIRECT LARYNGOSCOPY WITH BIOPSY;  Surgeon: Marlyce Finer, MD;  Location: [facility] OR;  Service: ENT;  Laterality: N/A;   DIRECT LARYNGOSCOPY N/A 03/13/2017   Procedure: DIRECT LARYNGOSCOPY;  Surgeon: Finer Marlyce, MD;  Location: [facility];  Service: ENT;  Laterality: N/A;   DIRECT LARYNGOSCOPY N/A 01/18/2018   Procedure: DIRECT LARYNGOSCOPY;  Surgeon: Finer Marlyce, MD;  Location: [facility] OR;  Service: ENT;  Laterality: N/A;   ESOPHAGOSCOPY N/A 10/28/2015   Procedure: ESOPHAGOSCOPY;  Surgeon: Marlyce Finer, MD;  Location: [facility] OR;  Service: ENT;  Laterality: N/A;   ESOPHAGOSCOPY WITH DILITATION N/A 03/13/2017   Procedure: ESOPHAGOSCOPY WITH DILITATION;  Surgeon: Finer Marlyce, MD;  Location: [facility];  Service: ENT;  Laterality: N/A;   ESOPHAGOSCOPY WITH DILITATION N/A 01/18/2018   Procedure: ESOPHAGOSCOPY WITH DILITATION POSSIBLE BOTOX ;  Surgeon: Finer Marlyce, MD;  Location: [facility] OR;  Service: ENT;  Laterality: N/A;   INCONTINENCE SURGERY  1990, [phone]   IR CV LINE INJECTION  06/28/2018   IR CV LINE INJECTION  07/23/2018   IR REMOVAL TUN ACCESS W/ PORT W/O FL MOD SED  03/21/2019   IR REMOVE CV FIBRIN SHEATH  07/23/2018   IR REPLACE G-TUBE SIMPLE WO FLUORO  06/19/2017   IR REPLACE G-TUBE SIMPLE WO FLUORO  09/05/2017   IR REPLACE G-TUBE SIMPLE WO FLUORO  12/21/2017   IR REPLC GASTRO/COLONIC TUBE PERCUT W/FLUORO  07/02/2018   IR US  GUIDE VASC ACCESS RIGHT  07/23/2018   LARYNGOSCOPY AND BRONCHOSCOPY N/A 10/28/2015   Procedure: BRONCHOSCOPY;  Surgeon: Marlyce Finer, MD;  Location: [facility] OR;  Service: ENT;  Laterality: N/A;   MULTIPLE EXTRACTIONS WITH ALVEOLOPLASTY N/A 10/28/2015   Procedure: Extraction of tooth #'s 2-12, 14,15,17,18,20-29, 31 with alveoloplasy and mandibular left torus reduction;  Surgeon: Tanda JULIANNA Fanny, DDS;  Location: [facility]  OR;  Service: Oral Surgery;  Laterality: N/A;   port-a-cath insertion     RECTOCELE REPAIR     TONSILLECTOMY     as a child   UPPER GASTROINTESTINAL ENDOSCOPY     urocele     correction surgery   Patient Active Problem List   Diagnosis Date Noted   Syncope 04/28/2022   Urinary incontinence 04/28/2022   Subclinical hypothyroidism 04/05/2021   Lipoma 04/05/2021   Healthcare maintenance 04/05/2021   Elevated blood pressure reading 04/05/2021   Thyroid  nodule 02/28/2019   Acquired hypothyroidism 03/19/2018   Metastasis to lung (HCC) 09/02/2016   Pancytopenia, acquired (HCC) 09/02/2016   Preventive measure 09/02/2016    Lymphedema in adult patient 05/13/2016   Malignant neoplasm of lower lobe of left lung (HCC) 02/17/2016   Malnutrition of moderate degree 01/04/2016   Goals of care, counseling/discussion    Dysphagia 12/31/2015   Mucositis due to radiation therapy 12/31/2015   Odynophagia    Throat pain in adult 12/29/2015   Anemia in chronic illness 12/16/2015   Intractable nausea and vomiting 11/13/2015   Gastritis, acute 11/13/2015   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   Lipoma of axilla 03/21/2013   Sleep disorder 03/17/2013   Health care maintenance 03/17/2013   Health maintenance examination 10/17/2011   Thalassemia 10/17/2011   Essential hypertension 10/17/2011   Hyperlipidemia 10/17/2011   Headache 10/17/2011    PCP: Nicholas Bar, MD  REFERRING PROVIDER: Teressa Rainell BROCKS, DO   REFERRING DIAG: 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    EVAL- Pt reports pain is the worst in the neck and is worse in the AM.  Pain is mostly in the back of the neck starting a few years ago  with no specific onset. Pain improves after being up and moving around for a while. She notes holding up to do prolonged activities is hard for example mowing the lawn or reading.   Hips and knees have been giving more issues about 6  weeks ago beginning of June. She reports nothing started this.  She reports  about week ago the hip and knee pain has resolved, with some occasional L knee residual intermittent soreness, but notes the pain in her neck is the worst.   PERTINENT HISTORY:  Hx of Cx, see PMHx  PAIN:  Are you having pain?  Neck Yes: NPRS scale: 0/10 Last 24 hours: 2-10/10 Pain location: back of the neck  Pain description: ache, pulling  Aggravating factors: getting out of bed morning, prolonged looking down atactivities, lifting head Relieving factors: Tylenol ,   Hips / knees Yes: NPRS scale: 0/10 Last 24 hours: 0/10 Pain location: along the side of the hip Pain description: ache Aggravating factors: more activity / movement Relieving factors: Tylenol    PRECAUTIONS: Other: hx of cancer  RED FLAGS: None   WEIGHT BEARING RESTRICTIONS: No  FALLS:  Has patient fallen in last 6 months? Yes. Number of falls 3  LIVING ENVIRONMENT: Lives with: lives with their daughter Lives in: House/apartment Stairs: Yes: Internal: 12 steps; on left going up Has following equipment at home: None  OCCUPATION: retired  PLOF: Independent  PATIENT GOALS: be able to move again, get stuff done without relying on others.    OBJECTIVE:  Note: Objective measures were completed at Evaluation unless otherwise noted.  DIAGNOSTIC FINDINGS:  X-ray Cervical spine 03/26/24 IMPRESSION: Moderate degenerative disc disease at C6-7 and milder degenerative disc disease at C5-6.  X-ray bil knee 03/26/24 IMPRESSION: Mild degenerative changes of the knees.  PATIENT SURVEYS:  NDI:  NECK DISABILITY INDEX  Date: 7/15 Score  Pain intensity 3 = The pain is fairly severe at the moment  2. Personal care (washing, dressing, etc.) 0 = I can look after myself normally without causing extra pain  3. Lifting 5 = I cannot lift or carry anything   4. Reading 2 =  I can read as much as I want with moderate pain in my neck  5. Headaches 0 = I have no headaches at all  6. Concentration 0 =  I can concentrate  fully when I want to with no difficulty  7. Work 2 = I can do most of my usual work, but no more  8. Driving 0 = I can drive my car without any neck pain  9. Sleeping 5 =  My sleep is completely disturbed (5-7 hrs sleepless)   10. Recreation 5 = I can't do any recreation activities at all  Total 22/50   Minimum Detectable Change (90% confidence): 5 points or 10% points  COGNITION: Overall cognitive status: Within functional limits for tasks assessed     SENSATION: Not tested   POSTURE: rounded shoulders, forward head, increased thoracic kyphosis, and    PALPATION: TTP with multiple trigger points along the bil cervicothoracic paraspinals with R>L, bil upper trap tightness, levator scapulae tenderness and sub-occipital tightness, increaed thoracic kyphosis  CERVICAL ROM:   Active ROM A/PROM (deg) 04/09/2024  Flexion 40  Extension 20  Right lateral flexion 1  Left lateral flexion 10  Right rotation 55  Left rotation 55   (Blank rows = not tested)  Note: resting postioning is 12 degrees of flexion          Concordant sx noted with end range flexion.   UE ROM:  Active ROM Right 04/09/2024 Left 04/09/2024  Shoulder flexion [facility]  Shoulder extension [facility]  Shoulder abduction [facility]  Shoulder adduction [facility]  Shoulder extension    Shoulder  internal rotation [facility]  Shoulder external rotation [facility]  Elbow flexion    Elbow extension    Wrist flexion    Wrist extension    Wrist ulnar deviation    Wrist radial deviation    Wrist pronation    Wrist supination     (Blank rows = not tested)  UE MMT:  MMT Right 04/09/2024 Left 04/09/2024  Shoulder flexion 4 4-  Shoulder extension 4 4-  Shoulder abduction 4 4-  Shoulder adduction 4 4  Shoulder extension    Shoulder internal rotation 4 4-  Shoulder external rotation 4 4-  Middle trapezius    Lower trapezius    Elbow flexion    Elbow extension    Wrist flexion    Wrist extension    Wrist ulnar deviation     Wrist radial deviation    Wrist pronation    Wrist supination    Grip strength     (Blank rows = not tested)  CERVICAL SPECIAL TESTS:  Cranial cervical flexion test: able to hold 20 seconds    LOWER EXTREMITY ROM:     Active  Right eval Left eval  Hip flexion    Hip extension    Hip abduction    Hip adduction    Hip internal rotation    Hip external rotation    Knee flexion    Knee extension    Ankle dorsiflexion    Ankle plantarflexion    Ankle inversion    Ankle eversion     (Blank rows = not tested)  LOWER EXTREMITY MMT:    MMT Right eval Left eval  Hip flexion    Hip extension    Hip abduction    Hip adduction    Hip internal rotation    Hip external rotation    Knee flexion    Knee extension    Ankle dorsiflexion    Ankle plantarflexion    Ankle inversion    Ankle eversion     (Blank rows = not tested)    GAIT: Distance walked: from waiting room to tx room Assistive device utilized: None Level of assistance: Complete Independence Comments: postural sway noted with limited trunk rotation   TREATMENT :  [facility] Adult PT Treatment:                                                DATE: 04/25/24 Sub-occipital release and how to do at home with tennis ball Tack and stretch of the sub-occipitals and cervical paraspinals Pec stretch 2 x30 sec with hands behind head and squeezing shoulder blades together.  Standing marching at freemotion 2 x 20 with 1 hand hold  Standing hip abduction 1 x 12 bil at freemotion Sit to stand 2 x 10  Updated HEP for LE strengthening  [facility] Adult PT Treatment:                                                DATE: 04/23/24 MTPR along the sub-occipitals and cervical paraspinals Supine chin tuck 1 x 10 holding 5 sec ea. VC to work into the movement UBE L4  4 min (backward only) Foam roll routnine (using 2 rolled towels) Ceiling punches, horizontal abd/  add, alternating ceiling punches, x - Y and  Supine scapular retract with bil  ER 2 x 12 with RTB Seated horizontal abd 2 x 12 with RTB while maintaining efficient posture Reviewed and updated HEP for supine pec stretch and horizontal abd  [facility] Adult PT Treatment:                                                DATE: 04-18-24 Therapeutic Exercise:  Standing Cervical Retraction  VC and TC 2 x 10 Supine Chin Tuck with Towel   10 x for 10 sec hold Supine DNF Liftoffs Eccentrically   1 set 5 x 5 sec hold  to fatigue Thoracic Extension self mobs with pink foam roller 10 x 5 sec hold Seated Scapular Retraction  2 x 10 seconds hold ER bil shoulder with RTB and towel rolls with shld adduction 3 x 10 replacing seated scapular rotation Seated UT stretch 2 x 30 sec bil Seated LS stretch2 x 30 sec bil  Self Care: Seated and standing posture with handout Sleep positioning Reading positioning and helps                                                                                  PATIENT EDUCATION:  Education details: evaluation findings, POC, goals, HEP with proper form/ rationale.  Person educated: Patient Education method: Explanation, Verbal cues, and Handouts Education comprehension: verbalized understanding  HOME EXERCISE PROGRAM: Access Code: [email or web address] URL: [email or web address] Date: 04/23/2024 Prepared by: Joneen Fresh  Exercises - Seated or Standing Cervical Retraction  - 1 x daily - 7 x weekly - 2 sets - 10 reps - 5 sec hold - Supine Chin Tuck with Towel  - 1 x daily - 7 x weekly - 2 sets - 10 reps - 10 hold - Supine DNF Liftoffs  - 1 x daily - 7 x weekly - 3 sets - 5 reps - 5-10 hold - Thoracic Extension Mobilization with Noodle  - 1 x daily - 7 x weekly - 2 sets - 10 reps - 5 seconds hold - Seated Upper Trapezius Stretch  - 1 x daily - 7 x weekly - 2 sets - 2 reps - 30 seconds hold - Gentle Levator Scapulae Stretch  - 1 x daily - 7 x weekly - 2 sets - 2 reps - 30 sec  hold - Shoulder External Rotation and Scapular Retraction with  Resistance  - 1 x daily - 7 x weekly - 3 sets - 10 reps - Seated Shoulder Horizontal Abduction with Resistance  - 1 x daily - 7 x weekly - 2 sets - 12 reps - Supine Pectoralis Stretch  - 2-3 x daily - 7 x weekly - 2 sets - 2 reps - 30-60 sec hold   For LE/ hips Access Code: [email or web address] URL: [email or web address] Date: 04/25/2024 Prepared by: Joneen Fresh  Exercises - Standing Hip Abduction with Resistance at Ankles and Counter Support  - 1 x daily - 7 x weekly - 2-3 sets - 10-15 reps - Standing March  -  1 x daily - 7 x weekly - 2-3 sets - 10-15 reps - Sit to Stand Without Arm Support  - 1 x daily - 7 x weekly - 2 sets - 10 reps  ASSESSMENT:  CLINICAL IMPRESSION: 04/25/2024 Debra Farrell reports feeling good after the last scheduled appointment but did continue to note some stiffness after mowing yesterday. Continued working STW along the posterior cervical muscles and pec stretching. Due to pt noted decreased pain in the neck focused on LE strengthening which she required verbal and visual cues for proper form. End of session she noted to continue feeling good in the neck.    EVAL- Patient is a 78 y.o. F who was seen today for physical therapy evaluation and treatment for referral dx of bil hip OA, bil knee pain, and neck pain. Debra Farrell reports her hips/ knees started bothering her 6 weeks ago with no specific onset but has since reduced activity and noted she currently hasn't had any pain for over a week, with the exception of intermittent L knee pain. She reports her CC is Neck pain and limited activity as a result that is worst in the AM or with prolonged positioning/ activities. She demonstrates limited cervical extension and sidebending and is a resting position of 12 degrees of flexion. She exhibits a classic upper cross syndrome with a forward head posture and forward rolled shoulders and significant thoracic kyphosis which potentially could be attributed partially to  her PMhx of throat and lung cx. She would benefit from physical therapy to reduce neck pain, strengthening of the posterior chain, maximize cervical and thoracic mobility, maximize postural efficiency and her overall function by addressing the deficits listed .  OBJECTIVE IMPAIRMENTS: decreased endurance, decreased ROM, increased fascial restrictions, increased muscle spasms, improper body mechanics, postural dysfunction, and pain.   ACTIVITY LIMITATIONS: carrying, lifting, and reading, yard work   PARTICIPATION LIMITATIONS: community activity and yard work  PERSONAL FACTORS: Age, Past/current experiences, and 1-2 comorbidities: hx of Cancer in both throat/ lung are also affecting patient's functional outcome.   REHAB POTENTIAL: Good  CLINICAL DECISION MAKING: Evolving/moderate complexity  EVALUATION COMPLEXITY: Moderate   GOALS: Goals reviewed with patient? Yes  SHORT TERM GOALS: Target date: 05/07/2024  Pt to be IND with initial HEP for therapeutic progression Baseline: Goal status: INITIAL  2.  Pt to verbalize/ demo efficient posture in sitting/ standing to reduce stress/ strain on her neck/ thoracic spine.  Baseline:  Goal status: INITIAL  3.  Pt to improve her cervical resting postion to </= 8 degrees of flexion to reduce stress on cervical paraspinal/ sub-occipitals  Baseline:  Goal status: INITIAL  4.  Pt to report pain to </= 7/10 at max to demo improving condition Baseline:  Goal status: INITIAL   LONG TERM GOALS: Target date: 06/04/2024  Improve cervical sidebending to >/= 15 degrees bil and maintain current cervical mobility with </= 2/10 max pain for safety with ADLs and driving Baseline:  Goal status: INITIAL  2.  Pt to be able to return to yard work, reading for  >/= 45 min reporting pain as </= slight (per the NDI) for pt's personal goals.  Baseline:  Goal status: INITIAL  3.  Pt to be able to lift/ carrying items with report of minimal pain for returning  to independent activity and maximize QOL Baseline:  Goal status: INITIAL  4.  Pt to be able to walk/ stand and navigate in home steps with no report of limitations with strength or  feeling of balance for safety.  Baseline:  Goal status: INITIAL  5.  Improve NDI to </= 10/50 to demo a measureable improvement in her function.  Baseline:  Goal status: INITIAL  6.  Pt to be IND with all HEP and is able to maintain and progress their current LOF IND.  Baseline:  Goal status: INITIAL  PLAN:  PT FREQUENCY: 1-2x/week  PT DURATION: 8 weeks  PLANNED INTERVENTIONS: 97110-Therapeutic exercises, 97530- Therapeutic activity, [id number]- Neuromuscular re-education, 97535- Self Care, [id number]- Manual therapy, [id number]- Gait training, [phone]- Traction (mechanical), [phone] (1-2 muscles), [id number] (3+ muscles)- Dry Needling, Taping, Spinal mobilization, Cryotherapy, and Moist heat.  PLAN FOR NEXT SESSION: review/ update HEP PRN, posterior chain activation, pec stretch,STW along cervical paraspinals.   Lulie Hurd PT, DPT, LAT, ATC  04/25/24  10:16 AM

## 2024-04-25 ENCOUNTER — Encounter: Payer: Self-pay | Admitting: Physical Therapy

## 2024-04-25 ENCOUNTER — Ambulatory Visit: Admitting: Physical Therapy

## 2024-04-25 DIAGNOSIS — M542 Cervicalgia: Secondary | ICD-10-CM

## 2024-04-25 DIAGNOSIS — M25552 Pain in left hip: Secondary | ICD-10-CM | POA: Diagnosis not present

## 2024-04-25 DIAGNOSIS — M25561 Pain in right knee: Secondary | ICD-10-CM

## 2024-04-25 DIAGNOSIS — M25551 Pain in right hip: Secondary | ICD-10-CM | POA: Diagnosis not present

## 2024-04-25 DIAGNOSIS — R293 Abnormal posture: Secondary | ICD-10-CM | POA: Diagnosis not present

## 2024-04-25 DIAGNOSIS — M160 Bilateral primary osteoarthritis of hip: Secondary | ICD-10-CM | POA: Diagnosis not present

## 2024-04-25 DIAGNOSIS — M25562 Pain in left knee: Secondary | ICD-10-CM | POA: Diagnosis not present

## 2024-04-29 ENCOUNTER — Ambulatory Visit: Attending: Family Medicine | Admitting: Physical Therapy

## 2024-04-29 DIAGNOSIS — R293 Abnormal posture: Secondary | ICD-10-CM | POA: Insufficient documentation

## 2024-04-29 DIAGNOSIS — M25562 Pain in left knee: Secondary | ICD-10-CM | POA: Insufficient documentation

## 2024-04-29 DIAGNOSIS — M542 Cervicalgia: Secondary | ICD-10-CM | POA: Insufficient documentation

## 2024-04-29 DIAGNOSIS — M25552 Pain in left hip: Secondary | ICD-10-CM | POA: Diagnosis not present

## 2024-04-29 DIAGNOSIS — M25561 Pain in right knee: Secondary | ICD-10-CM | POA: Diagnosis not present

## 2024-04-29 DIAGNOSIS — M25551 Pain in right hip: Secondary | ICD-10-CM | POA: Diagnosis not present

## 2024-04-29 NOTE — Therapy (Signed)
 OUTPATIENT PHYSICAL THERAPY CERVICAL  TREATMENT   Patient Name: Debra Farrell MRN: [id number] DOB:Nov 17, 1945, 78 y.o., female Today's Date: 04/29/2024  END OF SESSION:  PT End of Session - 04/29/24 0926     Visit Number 5    Number of Visits 17    Date for PT Re-Evaluation 06/04/24    Authorization Type U[employer] MCR    PT Start Time 9076    PT Stop Time 1017    PT Time Calculation (min) 54 min    Activity Tolerance Patient tolerated treatment well    Behavior During Therapy [facility] for tasks assessed/performed              Past Medical History:  Diagnosis Date   Abdominal abscess 11/30/2015   Anemia in chronic illness 12/16/2015   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   Eczema    GERD (gastroesophageal reflux disease)    Hx of radiation therapy 11/11/15- 01/05/2016   Base of Tongue and bilateral neck   Hx of radiation therapy 03/14/16- 03/23/16   Left Lower Lung   Hyperlipidemia    Hypertension    no meds   Infection with methicillin-resistant Staphylococcus aureus (MRSA) 11/30/2015   Intractable nausea and vomiting 11/13/2015   Laceration 04/2014   around R ear, for falling out of bed & hitting nightstand   Lung cancer (HCC) dx'd 01/2016   Nausea without vomiting 12/16/2015   Status post dilation of esophageal narrowing    Thalassemia minor    Throat cancer (HCC)    Throat pain in adult 12/29/2015   Past Surgical History:  Procedure Laterality Date   ABDOMINAL HYSTERECTOMY  1990   partial   DIRECT LARYNGOSCOPY N/A 10/28/2015   Procedure: DIRECT LARYNGOSCOPY WITH BIOPSY;  Surgeon: Marlyce Finer, MD;  Location: [facility] OR;  Service: ENT;  Laterality: N/A;   DIRECT LARYNGOSCOPY N/A 03/13/2017   Procedure: DIRECT LARYNGOSCOPY;  Surgeon: Finer Marlyce, MD;  Location: [facility];  Service: ENT;  Laterality: N/A;   DIRECT LARYNGOSCOPY N/A 01/18/2018   Procedure: DIRECT LARYNGOSCOPY;  Surgeon: Finer Marlyce, MD;  Location: [facility] OR;  Service: ENT;  Laterality:  N/A;   ESOPHAGOSCOPY N/A 10/28/2015   Procedure: ESOPHAGOSCOPY;  Surgeon: Marlyce Finer, MD;  Location: [facility] OR;  Service: ENT;  Laterality: N/A;   ESOPHAGOSCOPY WITH DILITATION N/A 03/13/2017   Procedure: ESOPHAGOSCOPY WITH DILITATION;  Surgeon: Finer Marlyce, MD;  Location: [facility];  Service: ENT;  Laterality: N/A;   ESOPHAGOSCOPY WITH DILITATION N/A 01/18/2018   Procedure: ESOPHAGOSCOPY WITH DILITATION POSSIBLE BOTOX ;  Surgeon: Finer Marlyce, MD;  Location[facility] OR;  Service: ENT;  Laterality: N/A;   INCONTINENCE SURGERY  1990, [id number]   IR CV LINE INJECTION  06/28/2018   IR CV LINE INJECTION  07/23/2018   IR REMOVAL TUN ACCESS W/ PORT W/O FL MOD SED  03/21/2019   IR REMOVE CV FIBRIN SHEATH  07/23/2018   IR REPLACE G-TUBE SIMPLE WO FLUORO  06/19/2017   IR REPLACE G-TUBE SIMPLE WO FLUORO  09/05/2017   IR REPLACE G-TUBE SIMPLE WO FLUORO  12/21/2017   IR REPLC GASTRO/COLONIC TUBE PERCUT W/FLUORO  07/02/2018   IR US  GUIDE VASC ACCESS RIGHT  07/23/2018   LARYNGOSCOPY AND BRONCHOSCOPY N/A 10/28/2015   Procedure: BRONCHOSCOPY;  Surgeon: Marlyce Finer, MD;  Location: [facility] OR;  Service: ENT;  Laterality: N/A;   MULTIPLE EXTRACTIONS WITH ALVEOLOPLASTY N/A 10/28/2015   Procedure: Extraction of tooth #'s 2-12, 14,15,17,18,20-29, 31 with alveoloplasy and mandibular left  torus reduction;  Surgeon: Tanda JULIANNA Fanny, DDS;  Location: [facility] OR;  Service: Oral Surgery;  Laterality: N/A;   port-a-cath insertion     RECTOCELE REPAIR     TONSILLECTOMY     as a child   UPPER GASTROINTESTINAL ENDOSCOPY     urocele     correction surgery   Patient Active Problem List   Diagnosis Date Noted   Syncope 04/28/2022   Urinary incontinence 04/28/2022   Subclinical hypothyroidism 04/05/2021   Lipoma 04/05/2021   Healthcare maintenance 04/05/2021   Elevated blood pressure reading 04/05/2021   Thyroid  nodule 02/28/2019   Acquired hypothyroidism 03/19/2018   Metastasis to lung (HCC) 09/02/2016    Pancytopenia, acquired (HCC) 09/02/2016   Preventive measure 09/02/2016   Lymphedema in adult patient 05/13/2016   Malignant neoplasm of lower lobe of left lung (HCC) 02/17/2016   Malnutrition of moderate degree 01/04/2016   Goals of care, counseling/discussion    Dysphagia 12/31/2015   Mucositis due to radiation therapy 12/31/2015   Odynophagia    Throat pain in adult 12/29/2015   Anemia in chronic illness 12/16/2015   Intractable nausea and vomiting 11/13/2015   Gastritis, acute 11/13/2015   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   Lipoma of axilla 03/21/2013   Sleep disorder 03/17/2013   Health care maintenance 03/17/2013   Health maintenance examination 10/17/2011   Thalassemia 10/17/2011   Essential hypertension 10/17/2011   Hyperlipidemia 10/17/2011   Headache 10/17/2011    PCP: Nicholas Bar, MD  REFERRING PROVIDER: Teressa Rainell BROCKS, DO   REFERRING DIAG: 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   EVAL- Pt reports pain is the worst in the neck and is worse in the AM.  Pain is mostly in the back of the neck starting a few years ago  with no specific onset. Pain improves after being up and moving around for a while. She notes holding up to do prolonged activities is hard for example mowing the lawn or reading.  Hips and knees have been giving more issues about 6 weeks ago beginning  of June. She reports nothing started this.  She reports about week ago the hip and knee pain has resolved, with some occasional L knee residual intermittent soreness, but notes the pain in her neck is the worst.   PERTINENT HISTORY:  Hx of Cx, see PMHx  PAIN:  Are you having pain?  Neck Yes: NPRS scale: 4/10 Last 24 hours: 2-10/10 Pain location: back of the neck  Pain description: ache, pulling  Aggravating factors: getting out of bed morning, prolonged looking down atactivities, lifting head Relieving factors: Tylenol ,   Hips / knees Yes: NPRS scale: 0/10 Last 24 hours: 0/10 Pain location: along the side of the hip Pain description: ache Aggravating factors: more activity / movement Relieving factors: Tylenol    PRECAUTIONS: Other: hx of cancer  RED FLAGS: None   WEIGHT BEARING RESTRICTIONS: No  FALLS:  Has patient fallen in last 6 months? Yes. Number of falls 3  LIVING ENVIRONMENT: Lives with: lives with their daughter Lives in: House/apartment Stairs: Yes: Internal: 12 steps; on left going up Has following equipment at home: None  OCCUPATION: retired  PLOF: Independent  PATIENT GOALS: be able to move again, get stuff done without relying on others.    OBJECTIVE:  Note: Objective measures were completed at Evaluation unless otherwise noted.  DIAGNOSTIC FINDINGS:  X-ray Cervical spine 03/26/24 IMPRESSION: Moderate degenerative disc disease at C6-7 and milder degenerative disc disease at C5-6.  X-ray bil knee 03/26/24 IMPRESSION: Mild degenerative changes of the knees.  PATIENT SURVEYS:  NDI:  NECK DISABILITY INDEX  Date: 7/15 Score  Pain intensity 3 = The pain is fairly severe at the moment  2. Personal care (washing, dressing, etc.) 0 = I can look after myself normally without causing extra pain  3. Lifting 5 = I cannot lift or carry anything   4. Reading 2 =  I can read as much as I want with moderate pain in my neck  5. Headaches 0 = I have no  headaches at all  6. Concentration 0 =  I can concentrate fully when I want to with no difficulty  7. Work 2 = I can do most of my usual work, but no more  8. Driving 0 = I can drive my car without any neck pain  9. Sleeping 5 =  My sleep is completely disturbed (5-7 hrs sleepless)   10. Recreation 5 = I can't do any recreation activities at all  Total 22/50   Minimum Detectable Change (90% confidence): 5 points or 10% points  COGNITION: Overall cognitive status: Within functional limits for tasks assessed     SENSATION: Not tested   POSTURE: rounded shoulders, forward head, increased thoracic kyphosis, and    PALPATION: TTP with multiple trigger points along the bil cervicothoracic paraspinals with R>L, bil upper trap tightness, levator scapulae tenderness and sub-occipital tightness, increaed thoracic kyphosis  CERVICAL ROM:   Active ROM A/PROM (deg) 04/09/2024  Flexion 40  Extension 20  Right lateral flexion 1  Left lateral flexion 10  Right rotation 55  Left rotation 55   (Blank rows = not tested)  Note: resting postioning is 12 degrees of flexion          Concordant sx noted with end range flexion.   UE ROM:  Active ROM Right 04/09/2024 Left 04/09/2024  Shoulder flexion [facility]  Shoulder extension [facility]  Shoulder abduction [facility]  Shoulder  adduction [facility]  Shoulder extension    Shoulder internal rotation [facility]  Shoulder external rotation [facility]  Elbow flexion    Elbow extension    Wrist flexion    Wrist extension    Wrist ulnar deviation    Wrist radial deviation    Wrist pronation    Wrist supination     (Blank rows = not tested)  UE MMT:  MMT Right 04/09/2024 Left 04/09/2024  Shoulder flexion 4 4-  Shoulder extension 4 4-  Shoulder abduction 4 4-  Shoulder adduction 4 4  Shoulder extension    Shoulder internal rotation 4 4-  Shoulder external rotation 4 4-  Middle trapezius    Lower trapezius    Elbow flexion    Elbow extension     Wrist flexion    Wrist extension    Wrist ulnar deviation    Wrist radial deviation    Wrist pronation    Wrist supination    Grip strength     (Blank rows = not tested)  CERVICAL SPECIAL TESTS:  Cranial cervical flexion test: able to hold 20 seconds    LOWER EXTREMITY ROM:     Active  Right eval Left eval  Hip flexion    Hip extension    Hip abduction    Hip adduction    Hip internal rotation    Hip external rotation    Knee flexion    Knee extension    Ankle dorsiflexion    Ankle plantarflexion    Ankle inversion    Ankle eversion     (Blank rows = not tested)  LOWER EXTREMITY MMT:    MMT Right eval Left eval  Hip flexion    Hip extension    Hip abduction    Hip adduction    Hip internal rotation    Hip external rotation    Knee flexion    Knee extension    Ankle dorsiflexion    Ankle plantarflexion    Ankle inversion    Ankle eversion     (Blank rows = not tested)   GAIT: Distance walked: from waiting room to tx room Assistive device utilized: None Level of assistance: Complete Independence Comments: postural sway noted with limited trunk rotation   TREATMENT :  [facility] Adult PT Treatment:                                                DATE: 04/29/24 Prone chin tuck (on elbows 3 x 10 holding 10 seconds Sub-occipital release  Supine chin tuck with horizontal abduction 2 x 12, the diagonals 1 x 12 Scapular retraction with ER 2 x 12 with GTB Bridge with isometric glute squeeze 2 x 15 (second set with arms crossed.  Altering LE marching for core activation, tactile cues for poroper form with posterior pelvic tilt 2 x 10 bil Standing airex balance rhomberg position, 4 x 30 sec Corner balance  Updated HEP for corner balance.   [facility] Adult PT Treatment:                                                DATE: 04/25/24 Sub-occipital release and how to do at home with tennis ball [place]  and stretch of the sub-occipitals and cervical paraspinals Pec stretch 2  x30 sec with hands behind head and squeezing shoulder blades together.  Standing marching at freemotion 2 x 20 with 1 hand hold  Standing hip abduction 1 x 12 bil at freemotion Sit to stand 2 x 10  Updated HEP for LE strengthening  [facility] Adult PT Treatment:                                                DATE: 04/23/24 MTPR along the sub-occipitals and cervical paraspinals Supine chin tuck 1 x 10 holding 5 sec ea. VC to work into the movement UBE L4  4 min (backward only) Foam roll routnine (using 2 rolled towels) Ceiling punches, horizontal abd/ add, alternating ceiling punches, x - Y and  Supine scapular retract with bil ER 2 x 12 with RTB Seated horizontal abd 2 x 12 with RTB while maintaining efficient posture Reviewed and updated HEP for supine pec stretch and horizontal abd   PATIENT EDUCATION:  Education details: evaluation findings, POC, goals, HEP with proper form/ rationale.  Person educated: Patient Education method: Explanation, Verbal cues, and Handouts Education comprehension: verbalized understanding  HOME EXERCISE PROGRAM: Access Code: [email or web address] URL: [email or web address] Date: 04/23/2024 Prepared by: Joneen Fresh  Exercises - Seated or Standing Cervical Retraction  - 1 x daily - 7 x weekly - 2 sets - 10 reps - 5 sec hold - Supine Chin Tuck with Towel  - 1 x daily - 7 x weekly - 2 sets - 10 reps - 10 hold - Supine DNF Liftoffs  - 1 x daily - 7 x weekly - 3 sets - 5 reps - 5-10 hold - Thoracic Extension Mobilization with Noodle  - 1 x daily - 7 x weekly - 2 sets - 10 reps - 5 seconds hold - Seated Upper Trapezius Stretch  - 1 x daily - 7 x weekly - 2 sets - 2 reps - 30 seconds hold - Gentle Levator Scapulae Stretch  - 1 x daily - 7 x weekly - 2 sets - 2 reps - 30 sec  hold - Shoulder External Rotation and Scapular Retraction with Resistance  - 1 x daily - 7 x weekly - 3 sets - 10 reps - Seated Shoulder Horizontal Abduction with Resistance  - 1 x daily  - 7 x weekly - 2 sets - 12 reps - Supine Pectoralis Stretch  - 2-3 x daily - 7 x weekly - 2 sets - 2 reps - 30-60 sec hold   For LE/ hips Access Code: [email or web address] URL: [email or web address] Date: 04/29/2024 Prepared by: Joneen Fresh  Exercises - Standing Hip Abduction with Resistance at Ankles and Counter Support  - 1 x daily - 7 x weekly - 2-3 sets - 10-15 reps - Standing March  - 1 x daily - 7 x weekly - 2-3 sets - 10-15 reps - Sit to Stand Without Arm Support  - 1 x daily - 7 x weekly - 2 sets - 10 reps - Corner Balance Feet Together With Eyes Open  - 1 x daily - 7 x weekly - 2 sets - 10 reps  ASSESSMENT:  CLINICAL IMPRESSION: 04/29/2024 Mrs Lague reports making some progress in the neck and positioning but continues to report challenges with maintaining posture in the yard when picking weeds for a long period  of time. Continued working on posterior chain strengthening and LE activation. She demonstrating moderate postural sway with rhomberg position on the airex pad, and would benefit from continued strengthening of the LE and balance training to maximize safety.    EVAL- Patient is a 78 y.o. F who was seen today for physical therapy evaluation and treatment for referral dx of bil hip OA, bil knee pain, and neck pain. Mrs Karis reports her hips/ knees started bothering her 6 weeks ago with no specific onset but has since reduced activity and noted she currently hasn't had any pain for over a week, with the exception of intermittent L knee pain. She reports her CC is Neck pain and limited activity as a result that is worst in the AM or with prolonged positioning/ activities. She demonstrates limited cervical extension and sidebending and is a resting position of 12 degrees of flexion. She exhibits a classic upper cross syndrome with a forward head posture and forward rolled shoulders and significant thoracic kyphosis which potentially could be attributed partially to her  PMhx of throat and lung cx. She would benefit from physical therapy to reduce neck pain, strengthening of the posterior chain, maximize cervical and thoracic mobility, maximize postural efficiency and her overall function by addressing the deficits listed .  OBJECTIVE IMPAIRMENTS: decreased endurance, decreased ROM, increased fascial restrictions, increased muscle spasms, improper body mechanics, postural dysfunction, and pain.   ACTIVITY LIMITATIONS: carrying, lifting, and reading, yard work   PARTICIPATION LIMITATIONS: community activity and yard work  PERSONAL FACTORS: Age, Past/current experiences, and 1-2 comorbidities: hx of Cancer in both throat/ lung are also affecting patient's functional outcome.   REHAB POTENTIAL: Good  CLINICAL DECISION MAKING: Evolving/moderate complexity  EVALUATION COMPLEXITY: Moderate   GOALS: Goals reviewed with patient? Yes  SHORT TERM GOALS: Target date: 05/07/2024  Pt to be IND with initial HEP for therapeutic progression Baseline: Goal status: INITIAL  2.  Pt to verbalize/ demo efficient posture in sitting/ standing to reduce stress/ strain on her neck/ thoracic spine.  Baseline:  Goal status: INITIAL  3.  Pt to improve her cervical resting postion to </= 8 degrees of flexion to reduce stress on cervical paraspinal/ sub-occipitals  Baseline:  Goal status: INITIAL  4.  Pt to report pain to </= 7/10 at max to demo improving condition Baseline:  Goal status: INITIAL   LONG TERM GOALS: Target date: 06/04/2024  Improve cervical sidebending to >/= 15 degrees bil and maintain current cervical mobility with </= 2/10 max pain for safety with ADLs and driving Baseline:  Goal status: INITIAL  2.  Pt to be able to return to yard work, reading for  >/= 45 min reporting pain as </= slight (per the NDI) for pt's personal goals.  Baseline:  Goal status: INITIAL  3.  Pt to be able to lift/ carrying items with report of minimal pain for returning to  independent activity and maximize QOL Baseline:  Goal status: INITIAL  4.  Pt to be able to walk/ stand and navigate in home steps with no report of limitations with strength or feeling of balance for safety.  Baseline:  Goal status: INITIAL  5.  Improve NDI to </= 10/50 to demo a measureable improvement in her function.  Baseline:  Goal status: INITIAL  6.  Pt to be IND with all HEP and is able to maintain and progress their current LOF IND.  Baseline:  Goal status: INITIAL  PLAN:  PT FREQUENCY: 1-2x/week  PT DURATION:  8 weeks  PLANNED INTERVENTIONS: 97110-Therapeutic exercises, 97530- Therapeutic activity, [id number]- Neuromuscular re-education, 97535- Self Care, [id number]- Manual therapy, [id number]- Gait training, [phone]- Traction (mechanical), [phone] (1-2 muscles), 20561 (3+ muscles)- Dry Needling, Taping, Spinal mobilization, Cryotherapy, and Moist heat.  PLAN FOR NEXT SESSION: review/ update HEP PRN, posterior chain activation, pec stretch,STW along cervical paraspinals.   Vania Rosero PT, DPT, LAT, ATC  04/29/24  10:43 AM

## 2024-05-01 ENCOUNTER — Ambulatory Visit: Admitting: Physical Therapy

## 2024-05-01 DIAGNOSIS — M542 Cervicalgia: Secondary | ICD-10-CM | POA: Diagnosis not present

## 2024-05-01 DIAGNOSIS — M25551 Pain in right hip: Secondary | ICD-10-CM

## 2024-05-01 DIAGNOSIS — M25562 Pain in left knee: Secondary | ICD-10-CM | POA: Diagnosis not present

## 2024-05-01 DIAGNOSIS — M25561 Pain in right knee: Secondary | ICD-10-CM

## 2024-05-01 DIAGNOSIS — R293 Abnormal posture: Secondary | ICD-10-CM

## 2024-05-01 DIAGNOSIS — M25552 Pain in left hip: Secondary | ICD-10-CM | POA: Diagnosis not present

## 2024-05-01 NOTE — Therapy (Signed)
 OUTPATIENT PHYSICAL THERAPY CERVICAL  TREATMENT   Patient Name: ITZAYANA PARDY MRN: [id number] DOB:[birth year], 78 y.o., female Today's Date: 05/01/2024  END OF SESSION:  PT End of Session - 05/01/24 1107     Visit Number 6    Number of Visits 17    Date for PT Re-Evaluation 06/04/24    Authorization Type [employer] MCR    PT Start Time 1102    PT Stop Time 1148    PT Time Calculation (min) 46 min    Activity Tolerance Patient tolerated treatment well    Behavior During Therapy WFL for tasks assessed/performed               Past Medical History:  Diagnosis Date   Abdominal abscess 11/30/2015   Anemia in chronic illness 12/16/2015   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   Eczema    GERD (gastroesophageal reflux disease)    Hx of radiation therapy 11/11/15- 01/05/2016   Base of Tongue and bilateral neck   Hx of radiation therapy 03/14/16- 03/23/16   Left Lower Lung   Hyperlipidemia    Hypertension    no meds   Infection with methicillin-resistant Staphylococcus aureus (MRSA) 11/30/2015   Intractable nausea and vomiting 11/13/2015   Laceration 04/2014   around R ear, for falling out of bed & hitting nightstand   Lung cancer (HCC) dx'd 01/2016   Nausea without vomiting 12/16/2015   Status post dilation of esophageal narrowing    Thalassemia minor    Throat cancer (HCC)    Throat pain in adult 12/29/2015   Past Surgical History:  Procedure Laterality Date   ABDOMINAL HYSTERECTOMY  1990   partial   DIRECT LARYNGOSCOPY N/A 10/28/2015   Procedure: DIRECT LARYNGOSCOPY WITH BIOPSY;  Surgeon: Marlyce Finer, MD;  Location: [facility] OR;  Service: ENT;  Laterality: N/A;   DIRECT LARYNGOSCOPY N/A 03/13/2017   Procedure: DIRECT LARYNGOSCOPY;  Surgeon: Finer Marlyce, MD;  Location: [facility];  Service: ENT;  Laterality: N/A;   DIRECT LARYNGOSCOPY N/A 01/18/2018   Procedure: DIRECT LARYNGOSCOPY;  Surgeon: Finer Marlyce, MD;  Location: [facility] OR;  Service: ENT;  Laterality:  N/A;   ESOPHAGOSCOPY N/A 10/28/2015   Procedure: ESOPHAGOSCOPY;  Surgeon: Marlyce Finer, MD;  Location: [facility] OR;  Service: ENT;  Laterality: N/A;   ESOPHAGOSCOPY WITH DILITATION N/A 03/13/2017   Procedure: ESOPHAGOSCOPY WITH DILITATION;  Surgeon: Finer Marlyce, MD;  Location: [facility];  Service: ENT;  Laterality: N/A;   ESOPHAGOSCOPY WITH DILITATION N/A 01/18/2018   Procedure: ESOPHAGOSCOPY WITH DILITATION POSSIBLE BOTOX ;  Surgeon: Finer Marlyce, MD;  Location: [facility] OR;  Service: ENT;  Laterality: N/A;   INCONTINENCE SURGERY  1990, [phone]   IR CV LINE INJECTION  06/28/2018   IR CV LINE INJECTION  07/23/2018   IR REMOVAL TUN ACCESS W/ PORT W/O FL MOD SED  03/21/2019   IR REMOVE CV FIBRIN SHEATH  07/23/2018   IR REPLACE G-TUBE SIMPLE WO FLUORO  06/19/2017   IR REPLACE G-TUBE SIMPLE WO FLUORO  09/05/2017   IR REPLACE G-TUBE SIMPLE WO FLUORO  12/21/2017   IR REPLC GASTRO/COLONIC TUBE PERCUT W/FLUORO  07/02/2018   IR US  GUIDE VASC ACCESS RIGHT  07/23/2018   LARYNGOSCOPY AND BRONCHOSCOPY N/A 10/28/2015   Procedure: BRONCHOSCOPY;  Surgeon: Marlyce Finer, MD;  Location: [facility] OR;  Service: ENT;  Laterality: N/A;   MULTIPLE EXTRACTIONS WITH ALVEOLOPLASTY N/A 10/28/2015   Procedure: Extraction of tooth #'s 2-12, 14,15,17,18,20-29, 31 with alveoloplasy and mandibular  left torus reduction;  Surgeon: Tanda JULIANNA Fanny, DDS;  Location: [facility] OR;  Service: Oral Surgery;  Laterality: N/A;   port-a-cath insertion     RECTOCELE REPAIR     TONSILLECTOMY     as a child   UPPER GASTROINTESTINAL ENDOSCOPY     urocele     correction surgery   Patient Active Problem List   Diagnosis Date Noted   Syncope 04/28/2022   Urinary incontinence 04/28/2022   Subclinical hypothyroidism 04/05/2021   Lipoma 04/05/2021   Healthcare maintenance 04/05/2021   Elevated blood pressure reading 04/05/2021   Thyroid  nodule 02/28/2019   Acquired hypothyroidism 03/19/2018   Metastasis to lung (HCC) 09/02/2016    Pancytopenia, acquired (HCC) 09/02/2016   Preventive measure 09/02/2016   Lymphedema in adult patient 05/13/2016   Malignant neoplasm of lower lobe of left lung (HCC) 02/17/2016   Malnutrition of moderate degree 01/04/2016   Goals of care, counseling/discussion    Dysphagia 12/31/2015   Mucositis due to radiation therapy 12/31/2015   Odynophagia    Throat pain in adult 12/29/2015   Anemia in chronic illness 12/16/2015   Intractable nausea and vomiting 11/13/2015   Gastritis, acute 11/13/2015   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   Lipoma of axilla 03/21/2013   Sleep disorder 03/17/2013   Health care maintenance 03/17/2013   Health maintenance examination 10/17/2011   Thalassemia 10/17/2011   Essential hypertension 10/17/2011   Hyperlipidemia 10/17/2011   Headache 10/17/2011    PCP: Nicholas Bar, MD  REFERRING PROVIDER: Teressa Rainell BROCKS, DO   REFERRING DIAG: 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 I did the balance exercise an it was still pretty wobbly.   EVAL- Pt reports pain is the worst in the neck and is worse in the AM.  Pain is mostly in the back of the neck starting a few years ago  with no specific onset. Pain improves after being up and moving around for a while. She notes holding up to do prolonged activities is hard for example mowing the lawn or reading.  Hips and knees have been giving more issues about 6 weeks  ago beginning of June. She reports nothing started this.  She reports about week ago the hip and knee pain has resolved, with some occasional L knee residual intermittent soreness, but notes the pain in her neck is the worst.   PERTINENT HISTORY:  Hx of Cx, see PMHx  PAIN:  Are you having pain?  Neck Yes: NPRS scale: 0/10 Last 24 hours: 2-10/10 Pain location: back of the neck  Pain description: ache, pulling  Aggravating factors: getting out of bed morning, prolonged looking down atactivities, lifting head Relieving factors: Tylenol ,   Hips / knees Yes: NPRS scale: 0/10 Last 24 hours: 0/10 Pain location: along the side of the hip Pain description: ache Aggravating factors: more activity / movement Relieving factors: Tylenol    PRECAUTIONS: Other: hx of cancer  RED FLAGS: None   WEIGHT BEARING RESTRICTIONS: No  FALLS:  Has patient fallen in last 6 months? Yes. Number of falls 3  LIVING ENVIRONMENT: Lives with: lives with their daughter Lives in: House/apartment Stairs: Yes: Internal: 12 steps; on left going up Has following equipment at home: None  OCCUPATION: retired  PLOF: Independent  PATIENT GOALS: be able to move again, get stuff done without relying on others.    OBJECTIVE:  Note: Objective measures were completed at Evaluation unless otherwise noted.  DIAGNOSTIC FINDINGS:  X-ray Cervical spine 03/26/24 IMPRESSION: Moderate degenerative disc disease at C6-7 and milder degenerative disc disease at C5-6.  X-ray bil knee 03/26/24 IMPRESSION: Mild degenerative changes of the knees.  PATIENT SURVEYS:  NDI:  NECK DISABILITY INDEX  Date: 7/15 Score  Pain intensity 3 = The pain is fairly severe at the moment  2. Personal care (washing, dressing, etc.) 0 = I can look after myself normally without causing extra pain  3. Lifting 5 = I cannot lift or carry anything   4. Reading 2 =  I can read as much as I want with moderate pain in my neck  5. Headaches 0 = I  have no headaches at all  6. Concentration 0 =  I can concentrate fully when I want to with no difficulty  7. Work 2 = I can do most of my usual work, but no more  8. Driving 0 = I can drive my car without any neck pain  9. Sleeping 5 =  My sleep is completely disturbed (5-7 hrs sleepless)   10. Recreation 5 = I can't do any recreation activities at all  Total 22/50   Minimum Detectable Change (90% confidence): 5 points or 10% points  COGNITION: Overall cognitive status: Within functional limits for tasks assessed     SENSATION: Not tested   POSTURE: rounded shoulders, forward head, increased thoracic kyphosis, and    PALPATION: TTP with multiple trigger points along the bil cervicothoracic paraspinals with R>L, bil upper trap tightness, levator scapulae tenderness and sub-occipital tightness, increaed thoracic kyphosis  CERVICAL ROM:   Active ROM A/PROM (deg) 04/09/2024  Flexion 40  Extension 20  Right lateral flexion 1  Left lateral flexion 10  Right rotation 55  Left rotation 55   (Blank rows = not tested)  Note: resting postioning is 12 degrees of flexion          Concordant sx noted with end range flexion.   UE ROM:  Active ROM Right 04/09/2024 Left 04/09/2024  Shoulder flexion [facility]  Shoulder extension [facility]  Shoulder abduction [facility]  Shoulder  adduction [facility]  Shoulder extension    Shoulder internal rotation [facility]  Shoulder external rotation [facility]  Elbow flexion    Elbow extension    Wrist flexion    Wrist extension    Wrist ulnar deviation    Wrist radial deviation    Wrist pronation    Wrist supination     (Blank rows = not tested)  UE MMT:  MMT Right 04/09/2024 Left 04/09/2024  Shoulder flexion 4 4-  Shoulder extension 4 4-  Shoulder abduction 4 4-  Shoulder adduction 4 4  Shoulder extension    Shoulder internal rotation 4 4-  Shoulder external rotation 4 4-  Middle trapezius    Lower trapezius    Elbow flexion    Elbow  extension    Wrist flexion    Wrist extension    Wrist ulnar deviation    Wrist radial deviation    Wrist pronation    Wrist supination    Grip strength     (Blank rows = not tested)  CERVICAL SPECIAL TESTS:  Cranial cervical flexion test: able to hold 20 seconds    LOWER EXTREMITY ROM:     Active  Right eval Left eval  Hip flexion    Hip extension    Hip abduction    Hip adduction    Hip internal rotation    Hip external rotation    Knee flexion    Knee extension    Ankle dorsiflexion    Ankle plantarflexion    Ankle inversion    Ankle eversion     (Blank rows = not tested)  LOWER EXTREMITY MMT:    MMT Right eval Left eval  Hip flexion    Hip extension    Hip abduction    Hip adduction    Hip internal rotation    Hip external rotation    Knee flexion    Knee extension    Ankle dorsiflexion    Ankle plantarflexion    Ankle inversion    Ankle eversion     (Blank rows = not tested)   GAIT: Distance walked: from waiting room to tx room Assistive device utilized: None Level of assistance: Complete Independence Comments: postural sway noted with limited trunk rotation   TREATMENT :  [facility] Adult PT Treatment:                                                DATE: 05/01/24 Nu-step L5 x 6 min LE only Sit to stand 1 x 10, 1 x 10 with RTB around knees Alternating marching while seated on ifiter 2 x 12 Palloff press 2 x 12 with GTB Alternating toe taps on 4 inch step 3 x 20  Rhomberg positoin 1 x 30 sec static, 1 x 30 sec with head turns Modified Tandem 3 x 20 sec alternating lead foot.  Standing forward reaching 3 x 10 with cone gradually pushed further requiring trunk lean   [facility] Adult PT Treatment:                                                DATE: 04/29/24 Prone chin tuck (on elbows 3 x 10 holding 10 seconds Sub-occipital release  Supine chin tuck with horizontal abduction 2 x 12, the diagonals 1 x 12 Scapular retraction with ER 2 x 12 with GTB Bridge  with isometric glute squeeze 2 x 15 (second set with arms crossed.  Altering LE marching for core activation, tactile cues for poroper form with posterior pelvic tilt 2 x 10 bil Standing airex balance rhomberg position, 4 x 30 sec Corner balance  Updated HEP for corner balance.   [facility] Adult PT Treatment:                                                DATE: 04/25/24 Sub-occipital release and how to do at home with tennis ball Tack and stretch of the sub-occipitals and cervical paraspinals Pec stretch 2 x30 sec with hands behind head and squeezing shoulder blades together.  Standing marching at freemotion 2 x 20 with 1 hand hold  Standing hip abduction 1 x 12 bil at freemotion Sit to stand 2 x 10  Updated HEP for LE strengthening   PATIENT EDUCATION:  Education details: evaluation findings, POC, goals, HEP with proper form/ rationale.  Person educated: Patient Education method: Explanation, Verbal cues, and Handouts Education comprehension: verbalized understanding  HOME EXERCISE PROGRAM: Access Code: [email or web address] URL: [email or web address] Date: 04/23/2024 Prepared by: Joneen Fresh  Exercises - Seated or Standing Cervical Retraction  - 1 x daily - 7 x weekly - 2 sets - 10 reps - 5 sec hold - Supine Chin Tuck with Towel  - 1 x daily - 7 x weekly - 2 sets - 10 reps - 10 hold - Supine DNF Liftoffs  - 1 x daily - 7 x weekly - 3 sets - 5 reps - 5-10 hold - Thoracic Extension Mobilization with Noodle  - 1 x daily - 7 x weekly - 2 sets - 10 reps - 5 seconds hold - Seated Upper Trapezius Stretch  - 1 x daily - 7 x weekly - 2 sets - 2 reps - 30 seconds hold - Gentle Levator Scapulae Stretch  - 1 x daily - 7 x weekly - 2 sets - 2 reps - 30 sec  hold - Shoulder External Rotation and Scapular Retraction with Resistance  - 1 x daily - 7 x weekly - 3 sets - 10 reps - Seated Shoulder Horizontal Abduction with Resistance  - 1 x daily - 7 x weekly - 2 sets - 12 reps - Supine  Pectoralis Stretch  - 2-3 x daily - 7 x weekly - 2 sets - 2 reps - 30-60 sec hold   For LE/ hips Access Code: [email or web address] URL: [email or web address] Date: 04/29/2024 Prepared by: Joneen Fresh  Exercises - Standing Hip Abduction with Resistance at Ankles and Counter Support  - 1 x daily - 7 x weekly - 2-3 sets - 10-15 reps - Standing March  - 1 x daily - 7 x weekly - 2-3 sets - 10-15 reps - Sit to Stand Without Arm Support  - 1 x daily - 7 x weekly - 2 sets - 10 reps - Corner Balance Feet Together With Eyes Open  - 1 x daily - 7 x weekly - 2 sets - 10 reps  ASSESSMENT:  CLINICAL IMPRESSION: 05/01/2024 Mrs Perras continues to make progress with physical therapy reporting no neck pain today. Focused today's session on LE strengthening and static/ dynamic balance training which she  demonstrated difficulty with alternating toe taps on a 4 inch step with lateral sway and foot clearance. End of session she noted feeling good with the neck only feeling fatigued.   EVAL- Patient is a 78 y.o. F who was seen today for physical therapy evaluation and treatment for referral dx of bil hip OA, bil knee pain, and neck pain. Mrs Lynds reports her hips/ knees started bothering her 6 weeks ago with no specific onset but has since reduced activity and noted she currently hasn't had any pain for over a week, with the exception of intermittent L knee pain. She reports her CC is Neck pain and limited activity as a result that is worst in the AM or with prolonged positioning/ activities. She demonstrates limited cervical extension and sidebending and is a resting position of 12 degrees of flexion. She exhibits a classic upper cross syndrome with a forward head posture and forward rolled shoulders and significant thoracic kyphosis which potentially could be attributed partially to her PMhx of throat and lung cx. She would benefit from physical therapy to reduce neck pain, strengthening of the posterior  chain, maximize cervical and thoracic mobility, maximize postural efficiency and her overall function by addressing the deficits listed .  OBJECTIVE IMPAIRMENTS: decreased endurance, decreased ROM, increased fascial restrictions, increased muscle spasms, improper body mechanics, postural dysfunction, and pain.   ACTIVITY LIMITATIONS: carrying, lifting, and reading, yard work   PARTICIPATION LIMITATIONS: community activity and yard work  PERSONAL FACTORS: Age, Past/current experiences, and 1-2 comorbidities: hx of Cancer in both throat/ lung are also affecting patient's functional outcome.   REHAB POTENTIAL: Good  CLINICAL DECISION MAKING: Evolving/moderate complexity  EVALUATION COMPLEXITY: Moderate   GOALS: Goals reviewed with patient? Yes  SHORT TERM GOALS: Target date: 05/07/2024  Pt to be IND with initial HEP for therapeutic progression Baseline: Goal status: INITIAL  2.  Pt to verbalize/ demo efficient posture in sitting/ standing to reduce stress/ strain on her neck/ thoracic spine.  Baseline:  Goal status: INITIAL  3.  Pt to improve her cervical resting postion to </= 8 degrees of flexion to reduce stress on cervical paraspinal/ sub-occipitals  Baseline:  Goal status: INITIAL  4.  Pt to report pain to </= 7/10 at max to demo improving condition Baseline:  Goal status: INITIAL   LONG TERM GOALS: Target date: 06/04/2024  Improve cervical sidebending to >/= 15 degrees bil and maintain current cervical mobility with </= 2/10 max pain for safety with ADLs and driving Baseline:  Goal status: INITIAL  2.  Pt to be able to return to yard work, reading for  >/= 45 min reporting pain as </= slight (per the NDI) for pt's personal goals.  Baseline:  Goal status: INITIAL  3.  Pt to be able to lift/ carrying items with report of minimal pain for returning to independent activity and maximize QOL Baseline:  Goal status: INITIAL  4.  Pt to be able to walk/ stand and navigate  in home steps with no report of limitations with strength or feeling of balance for safety.  Baseline:  Goal status: INITIAL  5.  Improve NDI to </= 10/50 to demo a measureable improvement in her function.  Baseline:  Goal status: INITIAL  6.  Pt to be IND with all HEP and is able to maintain and progress their current LOF IND.  Baseline:  Goal status: INITIAL  PLAN:  PT FREQUENCY: 1-2x/week  PT DURATION: 8 weeks  PLANNED INTERVENTIONS: 97110-Therapeutic exercises, 97530- Therapeutic  activity, [id number]- Neuromuscular re-education, [id number]- Self Care, [id number]- Manual therapy, [id number]- Gait training, [id number]- Traction (mechanical), [phone] (1-2 muscles), [id number] (3+ muscles)- Dry Needling, Taping, Spinal mobilization, Cryotherapy, and Moist heat.  PLAN FOR NEXT SESSION: review/ update HEP PRN, posterior chain activation, pec stretch,STW along cervical paraspinals.   Yaelis Scharfenberg PT, DPT, LAT, ATC  05/01/24  11:53 AM

## 2024-05-07 ENCOUNTER — Encounter: Payer: Self-pay | Admitting: Physical Therapy

## 2024-05-07 ENCOUNTER — Ambulatory Visit: Admitting: Physical Therapy

## 2024-05-07 DIAGNOSIS — M25551 Pain in right hip: Secondary | ICD-10-CM | POA: Diagnosis not present

## 2024-05-07 DIAGNOSIS — M25552 Pain in left hip: Secondary | ICD-10-CM | POA: Diagnosis not present

## 2024-05-07 DIAGNOSIS — R293 Abnormal posture: Secondary | ICD-10-CM | POA: Diagnosis not present

## 2024-05-07 DIAGNOSIS — M25562 Pain in left knee: Secondary | ICD-10-CM | POA: Diagnosis not present

## 2024-05-07 DIAGNOSIS — M542 Cervicalgia: Secondary | ICD-10-CM

## 2024-05-07 DIAGNOSIS — M25561 Pain in right knee: Secondary | ICD-10-CM | POA: Diagnosis not present

## 2024-05-07 NOTE — Therapy (Signed)
 OUTPATIENT PHYSICAL THERAPY CERVICAL  TREATMENT   Patient Name: Debra Farrell MRN: [id number] DOB:December 16, 1945, 78 y.o., female Today's Date: 05/07/2024  END OF SESSION:  PT End of Session - 05/07/24 0924     Visit Number 7    Number of Visits 17    Date for PT Re-Evaluation 06/04/24    Authorization Type [employer] MCR    Progress Note Due on Visit 10    PT Start Time 0925    PT Stop Time 1013    PT Time Calculation (min) 48 min    Activity Tolerance Patient tolerated treatment well    Behavior During Therapy [facility] for tasks assessed/performed                Past Medical History:  Diagnosis Date   Abdominal abscess 11/30/2015   Anemia in chronic illness 12/16/2015   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   Eczema    GERD (gastroesophageal reflux disease)    Hx of radiation therapy 11/11/15- 01/05/2016   Base of Tongue and bilateral neck   Hx of radiation therapy 03/14/16- 03/23/16   Left Lower Lung   Hyperlipidemia    Hypertension    no meds   Infection with methicillin-resistant Staphylococcus aureus (MRSA) 11/30/2015   Intractable nausea and vomiting 11/13/2015   Laceration 04/2014   around R ear, for falling out of bed & hitting nightstand   Lung cancer (HCC) dx'd 01/2016   Nausea without vomiting 12/16/2015   Status post dilation of esophageal narrowing    Thalassemia minor    Throat cancer (HCC)    Throat pain in adult 12/29/2015   Past Surgical History:  Procedure Laterality Date   ABDOMINAL HYSTERECTOMY  1990   partial   DIRECT LARYNGOSCOPY N/A 10/28/2015   Procedure: DIRECT LARYNGOSCOPY WITH BIOPSY;  Surgeon: Marlyce Finer, MD;  Location: [facility] OR;  Service: ENT;  Laterality: N/A;   DIRECT LARYNGOSCOPY N/A 03/13/2017   Procedure: DIRECT LARYNGOSCOPY;  Surgeon: Finer Marlyce, MD;  Location: [facility];  Service: ENT;  Laterality: N/A;   DIRECT LARYNGOSCOPY N/A 01/18/2018   Procedure: DIRECT LARYNGOSCOPY;  Surgeon: Finer Marlyce, MD;   Location: [facility] OR;  Service: ENT;  Laterality: N/A;   ESOPHAGOSCOPY N/A 10/28/2015   Procedure: ESOPHAGOSCOPY;  Surgeon: Marlyce Finer, MD;  Location: [facility] OR;  Service: ENT;  Laterality: N/A;   ESOPHAGOSCOPY WITH DILITATION N/A 03/13/2017   Procedure: ESOPHAGOSCOPY WITH DILITATION;  Surgeon: Finer Marlyce, MD;  Location: [facility];  Service: ENT;  Laterality: N/A;   ESOPHAGOSCOPY WITH DILITATION N/A 01/18/2018   Procedure: ESOPHAGOSCOPY WITH DILITATION POSSIBLE BOTOX ;  Surgeon: Finer Marlyce, MD;  Location: [facility] OR;  Service: ENT;  Laterality: N/A;   INCONTINENCE SURGERY  1990, [phone]   IR CV LINE INJECTION  06/28/2018   IR CV LINE INJECTION  07/23/2018   IR REMOVAL TUN ACCESS W/ PORT W/O FL MOD SED  03/21/2019   IR REMOVE CV FIBRIN SHEATH  07/23/2018   IR REPLACE G-TUBE SIMPLE WO FLUORO  06/19/2017   IR REPLACE G-TUBE SIMPLE WO FLUORO  09/05/2017   IR REPLACE G-TUBE SIMPLE WO FLUORO  12/21/2017   IR REPLC GASTRO/COLONIC TUBE PERCUT W/FLUORO  07/02/2018   IR US  GUIDE VASC ACCESS RIGHT  07/23/2018   LARYNGOSCOPY AND BRONCHOSCOPY N/A 10/28/2015   Procedure: BRONCHOSCOPY;  Surgeon: Marlyce Finer, MD;  Location: [facility] OR;  Service: ENT;  Laterality: N/A;   MULTIPLE EXTRACTIONS WITH ALVEOLOPLASTY N/A 10/28/2015   Procedure: Extraction  of tooth #'s 2-12, 14,15,17,18,20-29, 31 with alveoloplasy and mandibular left torus reduction;  Surgeon: Tanda JULIANNA Fanny, DDS;  Location: [facility] OR;  Service: Oral Surgery;  Laterality: N/A;   port-a-cath insertion     RECTOCELE REPAIR     TONSILLECTOMY     as a child   UPPER GASTROINTESTINAL ENDOSCOPY     urocele     correction surgery   Patient Active Problem List   Diagnosis Date Noted   Syncope 04/28/2022   Urinary incontinence 04/28/2022   Subclinical hypothyroidism 04/05/2021   Lipoma 04/05/2021   Healthcare maintenance 04/05/2021   Elevated blood pressure reading 04/05/2021   Thyroid  nodule 02/28/2019   Acquired hypothyroidism  03/19/2018   Metastasis to lung (HCC) 09/02/2016   Pancytopenia, acquired (HCC) 09/02/2016   Preventive measure 09/02/2016   Lymphedema in adult patient 05/13/2016   Malignant neoplasm of lower lobe of left lung (HCC) 02/17/2016   Malnutrition of moderate degree 01/04/2016   Goals of care, counseling/discussion    Dysphagia 12/31/2015   Mucositis due to radiation therapy 12/31/2015   Odynophagia    Throat pain in adult 12/29/2015   Anemia in chronic illness 12/16/2015   Intractable nausea and vomiting 11/13/2015   Gastritis, acute 11/13/2015   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   Lipoma of axilla 03/21/2013   Sleep disorder 03/17/2013   Health care maintenance 03/17/2013   Health maintenance examination 10/17/2011   Thalassemia 10/17/2011   Essential hypertension 10/17/2011   Hyperlipidemia 10/17/2011   Headache 10/17/2011    PCP: Nicholas Bar, MD  REFERRING PROVIDER: Teressa Rainell BROCKS, DO   REFERRING DIAG: Bilateral primary osteoarthritis of hip [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], Neck pain [M54.2], Acute pain of both knees [M25.561, M25.562]   Rationale for Evaluation and Treatment: Rehabilitation  THERAPY DIAG:  Acute pain of both knees  Pain of both hip joints  Cervicalgia  Abnormal posture  ONSET DATE: Neck pain intermittent couple of years  SUBJECTIVE:                                                                                                                                                                                           SUBJECTIVE STATEMENT:  05/07/2024 I am having feeling more sore I did a lot of weed pulling and have been having trouble sleeping for the last 3 nights. I just started taking tylenol  again to help.   EVAL- Pt reports pain is the worst in the neck and is worse in the AM.  Pain is mostly in the back of the neck starting a few years ago  with no specific onset. Pain improves after being up  and moving around for a while. She notes holding up to do  prolonged activities is hard for example mowing the lawn or reading.   Hips and knees have been giving more issues about 6 weeks ago beginning of June. She reports nothing started this.  She reports about week ago the hip and knee pain has resolved, with some occasional L knee residual intermittent soreness, but notes the pain in her neck is the worst.   PERTINENT HISTORY:  Hx of Cx, see PMHx  PAIN:  Are you having pain?  Neck Yes: NPRS scale: 5/10 Last 24 hours: 2-10/10 Pain location: back of the neck  Pain description: ache, pulling  Aggravating factors: getting out of bed morning, prolonged looking down atactivities, lifting head Relieving factors: Tylenol ,   Hips / knees Yes: NPRS scale: 0/10 Last 24 hours: 0/10 Pain location: along the side of the hip Pain description: ache Aggravating factors: more activity / movement Relieving factors: Tylenol    PRECAUTIONS: Other: hx of cancer  RED FLAGS: None   WEIGHT BEARING RESTRICTIONS: No  FALLS:  Has patient fallen in last 6 months? Yes. Number of falls 3  LIVING ENVIRONMENT: Lives with: lives with their daughter Lives in: House/apartment Stairs: Yes: Internal: 12 steps; on left going up Has following equipment at home: None  OCCUPATION: retired  PLOF: Independent  PATIENT GOALS: be able to move again, get stuff done without relying on others.    OBJECTIVE:  Note: Objective measures were completed at Evaluation unless otherwise noted.  DIAGNOSTIC FINDINGS:  X-ray Cervical spine 03/26/24 IMPRESSION: Moderate degenerative disc disease at C6-7 and milder degenerative disc disease at C5-6.  X-ray bil knee 03/26/24 IMPRESSION: Mild degenerative changes of the knees.  PATIENT SURVEYS:  NDI: 22/50 NECK DISABILITY INDEX  Date: 7/15 Score  Pain intensity 3 = The pain is fairly severe at the moment  2. Personal care (washing, dressing, etc.) 0 = I can look after myself normally without causing extra pain  3. Lifting  5 = I cannot lift or carry anything   4. Reading 2 =  I can read as much as I want with moderate pain in my neck  5. Headaches 0 = I have no headaches at all  6. Concentration 0 =  I can concentrate fully when I want to with no difficulty  7. Work 2 = I can do most of my usual work, but no more  8. Driving 0 = I can drive my car without any neck pain  9. Sleeping 5 =  My sleep is completely disturbed (5-7 hrs sleepless)   10. Recreation 5 = I can't do any recreation activities at all  Total 22/50   Minimum Detectable Change (90% confidence): 5 points or 10% points  Reassessed on 05/07/24 NDI 9/50 = 18%  COGNITION: Overall cognitive status: Within functional limits for tasks assessed     SENSATION: Not tested   POSTURE: rounded shoulders, forward head, increased thoracic kyphosis, and    PALPATION: TTP with multiple trigger points along the bil cervicothoracic paraspinals with R>L, bil upper trap tightness, levator scapulae tenderness and sub-occipital tightness, increaed thoracic kyphosis  CERVICAL ROM:   Active ROM A/PROM (deg) 04/09/2024  Flexion 40  Extension 20  Right lateral flexion 1  Left lateral flexion 10  Right rotation 55  Left rotation 55   (Blank rows = not tested)  Note: resting postioning is 12 degrees of flexion          Concordant sx  noted with end range flexion.   UE ROM:  Active ROM Right 04/09/2024 Left 04/09/2024  Shoulder flexion [facility]  Shoulder extension [facility]  Shoulder abduction [facility] WFL  Shoulder adduction [facility]  Shoulder extension    Shoulder internal rotation [facility] WFL  Shoulder external rotation [facility] WFL  Elbow flexion    Elbow extension    Wrist flexion    Wrist extension    Wrist ulnar deviation    Wrist radial deviation    Wrist pronation    Wrist supination     (Blank rows = not tested)  UE MMT:  MMT Right 04/09/2024 Left 04/09/2024  Shoulder flexion 4 4-  Shoulder extension 4 4-  Shoulder abduction 4 4-  Shoulder  adduction 4 4  Shoulder extension    Shoulder internal rotation 4 4-  Shoulder external rotation 4 4-  Middle trapezius    Lower trapezius    Elbow flexion    Elbow extension    Wrist flexion    Wrist extension    Wrist ulnar deviation    Wrist radial deviation    Wrist pronation    Wrist supination    Grip strength     (Blank rows = not tested)  CERVICAL SPECIAL TESTS:  Cranial cervical flexion test: able to hold 20 seconds    LOWER EXTREMITY ROM:     Active  Right eval Left eval  Hip flexion    Hip extension    Hip abduction    Hip adduction    Hip internal rotation    Hip external rotation    Knee flexion    Knee extension    Ankle dorsiflexion    Ankle plantarflexion    Ankle inversion    Ankle eversion     (Blank rows = not tested)  LOWER EXTREMITY MMT:    MMT Right eval Left eval  Hip flexion    Hip extension    Hip abduction    Hip adduction    Hip internal rotation    Hip external rotation    Knee flexion    Knee extension    Ankle dorsiflexion    Ankle plantarflexion    Ankle inversion    Ankle eversion     (Blank rows = not tested)   GAIT: Distance walked: from waiting room to tx room Assistive device utilized: None Level of assistance: Complete Independence Comments: postural sway noted with limited trunk rotation   TREATMENT :  [facility] Adult PT Treatment:                                                DATE: 05/07/24 MTPR along bil upp trap Sub-occipital release UBE L 3 x 5 min  (FWD/ BWD x 2:30) Upper trap stretch 2 x 30 sec Posture education and benefits of taking short breaks Standing pec stretch in corner 2 x 30 sec Tools that can be purchased to assist with self TP release.   [facility] Adult PT Treatment:                                                DATE: 05/01/24 Nu-step L5 x 6 min LE only Sit to stand 1 x 10,  1 x 10 with RTB around knees Alternating marching while seated on ifiter 2 x 12 Palloff press 2 x 12 with  GTB Alternating toe taps on 4 inch step 3 x 20  Rhomberg positoin 1 x 30 sec static, 1 x 30 sec with head turns Modified Tandem 3 x 20 sec alternating lead foot.  Standing forward reaching 3 x 10 with cone gradually pushed further requiring trunk lean   [facility] Adult PT Treatment:                                                DATE: 04/29/24 Prone chin tuck (on elbows 3 x 10 holding 10 seconds Sub-occipital release  Supine chin tuck with horizontal abduction 2 x 12, the diagonals 1 x 12 Scapular retraction with ER 2 x 12 with GTB Bridge with isometric glute squeeze 2 x 15 (second set with arms crossed.  Altering LE marching for core activation, tactile cues for poroper form with posterior pelvic tilt 2 x 10 bil Standing airex balance rhomberg position, 4 x 30 sec Corner balance  Updated HEP for corner balance.   [facility] Adult PT Treatment:                                                DATE: 04/25/24 Sub-occipital release and how to do at home with tennis ball Tack and stretch of the sub-occipitals and cervical paraspinals Pec stretch 2 x30 sec with hands behind head and squeezing shoulder blades together.  Standing marching at freemotion 2 x 20 with 1 hand hold  Standing hip abduction 1 x 12 bil at freemotion Sit to stand 2 x 10  Updated HEP for LE strengthening   PATIENT EDUCATION:  Education details: evaluation findings, POC, goals, HEP with proper form/ rationale.  Person educated: Patient Education method: Explanation, Verbal cues, and Handouts Education comprehension: verbalized understanding  HOME EXERCISE PROGRAM: Access Code: [email or web address] URL: [email or web address] Date: 05/07/2024 Prepared by: Joneen Fresh  Exercises - Seated or Standing Cervical Retraction  - 1 x daily - 7 x weekly - 2 sets - 10 reps - 5 sec hold - Supine Chin Tuck with Towel  - 1 x daily - 7 x weekly - 2 sets - 10 reps - 10 hold - Supine DNF Liftoffs  - 1 x daily - 7 x weekly - 3 sets - 5  reps - 5-10 hold - Thoracic Extension Mobilization with Noodle  - 1 x daily - 7 x weekly - 2 sets - 10 reps - 5 seconds hold - Seated Upper Trapezius Stretch  - 1 x daily - 7 x weekly - 2 sets - 2 reps - 30 seconds hold - Gentle Levator Scapulae Stretch  - 1 x daily - 7 x weekly - 2 sets - 2 reps - 30 sec  hold - Shoulder External Rotation and Scapular Retraction with Resistance  - 1 x daily - 7 x weekly - 3 sets - 10 reps - Seated Shoulder Horizontal Abduction with Resistance  - 1 x daily - 7 x weekly - 2 sets - 12 reps - Supine Pectoralis Stretch  - 2-3 x daily - 7 x weekly - 2 sets - 2 reps - 30-60 sec  hold - Doorway Pec Stretch at 90 Degrees Abduction  - 2 x daily - 7 x weekly - 2 sets - 2 reps - 30 seconds hold - Corner Pec Major Stretch  - 2 x daily - 7 x weekly - 2 sets - 2 reps - 30 hold   For LE/ hips Access Code: [email or web address] URL: [email or web address] Date: 04/29/2024 Prepared by: Joneen Fresh  Exercises - Standing Hip Abduction with Resistance at Ankles and Counter Support  - 1 x daily - 7 x weekly - 2-3 sets - 10-15 reps - Standing March  - 1 x daily - 7 x weekly - 2-3 sets - 10-15 reps - Sit to Stand Without Arm Support  - 1 x daily - 7 x weekly - 2 sets - 10 reps - Corner Balance Feet Together With Eyes Open  - 1 x daily - 7 x weekly - 2 sets - 10 reps  ASSESSMENT:  CLINICAL IMPRESSION: 05/07/2024 Mrs Greenstein arrives to session noting increased sorness in the her neck which she attributes to doing a few hours of yard work pulling heavy duty weeds and had no rest breaks. Today she scored 9/50 = 18% limitation on the NDI noting significant improvement since starting PT. Continued working on muscle tension along the posterior chain followed with exercises and stretch. Reviewed posture and benefits of taking intermittent breaks, and tools that can help with trigger points at home, and suggestion of ways to maintain efficient posture. She reported reduction of pain  end of session from a 5/10 to a 3/10.    EVAL- Patient is a 78 y.o. F who was seen today for physical therapy evaluation and treatment for referral dx of bil hip OA, bil knee pain, and neck pain. Mrs Tortora reports her hips/ knees started bothering her 6 weeks ago with no specific onset but has since reduced activity and noted she currently hasn't had any pain for over a week, with the exception of intermittent L knee pain. She reports her CC is Neck pain and limited activity as a result that is worst in the AM or with prolonged positioning/ activities. She demonstrates limited cervical extension and sidebending and is a resting position of 12 degrees of flexion. She exhibits a classic upper cross syndrome with a forward head posture and forward rolled shoulders and significant thoracic kyphosis which potentially could be attributed partially to her PMhx of throat and lung cx. She would benefit from physical therapy to reduce neck pain, strengthening of the posterior chain, maximize cervical and thoracic mobility, maximize postural efficiency and her overall function by addressing the deficits listed .  OBJECTIVE IMPAIRMENTS: decreased endurance, decreased ROM, increased fascial restrictions, increased muscle spasms, improper body mechanics, postural dysfunction, and pain.   ACTIVITY LIMITATIONS: carrying, lifting, and reading, yard work   PARTICIPATION LIMITATIONS: community activity and yard work  PERSONAL FACTORS: Age, Past/current experiences, and 1-2 comorbidities: hx of Cancer in both throat/ lung are also affecting patient's functional outcome.   REHAB POTENTIAL: Good  CLINICAL DECISION MAKING: Evolving/moderate complexity  EVALUATION COMPLEXITY: Moderate   GOALS: Goals reviewed with patient? Yes  SHORT TERM GOALS: Target date: 05/07/2024  Pt to be IND with initial HEP for therapeutic progression Baseline: Goal status: MET  2.  Pt to verbalize/ demo efficient posture in sitting/  standing to reduce stress/ strain on her neck/ thoracic spine.  Baseline:  Goal status: MET  3.  Pt to improve her cervical resting postion to </= 8 degrees of flexion to  reduce stress on cervical paraspinal/ sub-occipitals  Baseline:  Goal status: ongoing   4.  Pt to report pain to </= 7/10 at max to demo improving condition Baseline:  Goal status: MET   LONG TERM GOALS: Target date: 06/04/2024  Improve cervical sidebending to >/= 15 degrees bil and maintain current cervical mobility with </= 2/10 max pain for safety with ADLs and driving Baseline:  Goal status: INITIAL  2.  Pt to be able to return to yard work, reading for  >/= 45 min reporting pain as </= slight (per the NDI) for pt's personal goals.  Baseline:  Goal status: INITIAL  3.  Pt to be able to lift/ carrying items with report of minimal pain for returning to independent activity and maximize QOL Baseline:  Goal status: INITIAL  4.  Pt to be able to walk/ stand and navigate in home steps with no report of limitations with strength or feeling of balance for safety.  Baseline:  Goal status: INITIAL  5.  Improve NDI to </= 10/50 to demo a measureable improvement in her function.  Baseline:  Goal status: INITIAL  6.  Pt to be IND with all HEP and is able to maintain and progress their current LOF IND.  Baseline:  Goal status: INITIAL  PLAN:  PT FREQUENCY: 1-2x/week  PT DURATION: 8 weeks  PLANNED INTERVENTIONS: 97110-Therapeutic exercises, 97530- Therapeutic activity, [id number]- Neuromuscular re-education, 97535- Self Care, [id number]- Manual therapy, [id number]- Gait training, [id number]- Traction (mechanical), [phone] (1-2 muscles), [id number] (3+ muscles)- Dry Needling, Taping, Spinal mobilization, Cryotherapy, and Moist heat.  PLAN FOR NEXT SESSION: review/ update HEP PRN, posterior chain activation, pec stretch,STW along cervical paraspinals.   Zaliyah Meikle PT, DPT, LAT, ATC  05/07/24  10:17 AM

## 2024-05-09 ENCOUNTER — Ambulatory Visit: Admitting: Physical Therapy

## 2024-05-09 ENCOUNTER — Encounter: Payer: Self-pay | Admitting: Physical Therapy

## 2024-05-09 DIAGNOSIS — R293 Abnormal posture: Secondary | ICD-10-CM

## 2024-05-09 DIAGNOSIS — M542 Cervicalgia: Secondary | ICD-10-CM

## 2024-05-09 DIAGNOSIS — M25551 Pain in right hip: Secondary | ICD-10-CM | POA: Diagnosis not present

## 2024-05-09 DIAGNOSIS — M25561 Pain in right knee: Secondary | ICD-10-CM | POA: Diagnosis not present

## 2024-05-09 DIAGNOSIS — M25552 Pain in left hip: Secondary | ICD-10-CM | POA: Diagnosis not present

## 2024-05-09 DIAGNOSIS — M25562 Pain in left knee: Secondary | ICD-10-CM | POA: Diagnosis not present

## 2024-05-09 NOTE — Therapy (Signed)
 OUTPATIENT PHYSICAL THERAPY CERVICAL  TREATMENT   Patient Name: Debra Farrell MRN: [id number] DOB:Jun 10, 1946, 78 y.o., female Today's Date: 05/09/2024  END OF SESSION:  PT End of Session - 05/09/24 0932     Visit Number 8    Number of Visits 17    Date for PT Re-Evaluation 06/04/24    Authorization Type [employer] MCR    Progress Note Due on Visit 10    PT Start Time 0917    PT Stop Time 0955    PT Time Calculation (min) 38 min                 Past Medical History:  Diagnosis Date   Abdominal abscess 11/30/2015   Anemia in chronic illness 12/16/2015   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   Eczema    GERD (gastroesophageal reflux disease)    Hx of radiation therapy 11/11/15- 01/05/2016   Base of Tongue and bilateral neck   Hx of radiation therapy 03/14/16- 03/23/16   Left Lower Lung   Hyperlipidemia    Hypertension    no meds   Infection with methicillin-resistant Staphylococcus aureus (MRSA) 11/30/2015   Intractable nausea and vomiting 11/13/2015   Laceration 04/2014   around R ear, for falling out of bed & hitting nightstand   Lung cancer (HCC) dx'd 01/2016   Nausea without vomiting 12/16/2015   Status post dilation of esophageal narrowing    Thalassemia minor    Throat cancer (HCC)    Throat pain in adult 12/29/2015   Past Surgical History:  Procedure Laterality Date   ABDOMINAL HYSTERECTOMY  1990   partial   DIRECT LARYNGOSCOPY N/A 10/28/2015   Procedure: DIRECT LARYNGOSCOPY WITH BIOPSY;  Surgeon: Marlyce Finer, MD;  Location: [facility] OR;  Service: ENT;  Laterality: N/A;   DIRECT LARYNGOSCOPY N/A 03/13/2017   Procedure: DIRECT LARYNGOSCOPY;  Surgeon: Finer Marlyce, MD;  Location: [facility];  Service: ENT;  Laterality: N/A;   DIRECT LARYNGOSCOPY N/A 01/18/2018   Procedure: DIRECT LARYNGOSCOPY;  Surgeon: Finer Marlyce, MD;  Location: [facility] OR;  Service: ENT;  Laterality: N/A;   ESOPHAGOSCOPY N/A 10/28/2015   Procedure: ESOPHAGOSCOPY;  Surgeon:  Marlyce Finer, MD;  Location: [facility] OR;  Service: ENT;  Laterality: N/A;   ESOPHAGOSCOPY WITH DILITATION N/A 03/13/2017   Procedure: ESOPHAGOSCOPY WITH DILITATION;  Surgeon: Finer Marlyce, MD;  Location:  SURGERY CENTER;  Service: ENT;  Laterality: N/A;   ESOPHAGOSCOPY WITH DILITATION N/A 01/18/2018   Procedure: ESOPHAGOSCOPY WITH DILITATION POSSIBLE BOTOX ;  Surgeon: Finer Marlyce, MD;  Location: [facility] OR;  Service: ENT;  Laterality: N/A;   INCONTINENCE SURGERY  1990, [phone]   IR CV LINE INJECTION  06/28/2018   IR CV LINE INJECTION  07/23/2018   IR REMOVAL TUN ACCESS W/ PORT W/O FL MOD SED  03/21/2019   IR REMOVE CV FIBRIN SHEATH  07/23/2018   IR REPLACE G-TUBE SIMPLE WO FLUORO  06/19/2017   IR REPLACE G-TUBE SIMPLE WO FLUORO  09/05/2017   IR REPLACE G-TUBE SIMPLE WO FLUORO  12/21/2017   IR REPLC GASTRO/COLONIC TUBE PERCUT W/FLUORO  07/02/2018   IR US  GUIDE VASC ACCESS RIGHT  07/23/2018   LARYNGOSCOPY AND BRONCHOSCOPY N/A 10/28/2015   Procedure: BRONCHOSCOPY;  Surgeon: Marlyce Finer, MD;  Location: [facility] OR;  Service: ENT;  Laterality: N/A;   MULTIPLE EXTRACTIONS WITH ALVEOLOPLASTY N/A 10/28/2015   Procedure: Extraction of tooth #'s 2-12, 14,15,17,18,20-29, 31 with alveoloplasy and mandibular left torus reduction;  Surgeon: Tanda JULIANNA Fanny,  DDS;  Location: [facility] OR;  Service: Oral Surgery;  Laterality: N/A;   port-a-cath insertion     RECTOCELE REPAIR     TONSILLECTOMY     as a child   UPPER GASTROINTESTINAL ENDOSCOPY     urocele     correction surgery   Patient Active Problem List   Diagnosis Date Noted   Syncope 04/28/2022   Urinary incontinence 04/28/2022   Subclinical hypothyroidism 04/05/2021   Lipoma 04/05/2021   Healthcare maintenance 04/05/2021   Elevated blood pressure reading 04/05/2021   Thyroid  nodule 02/28/2019   Acquired hypothyroidism 03/19/2018   Metastasis to lung (HCC) 09/02/2016   Pancytopenia, acquired (HCC) 09/02/2016   Preventive measure 09/02/2016    Lymphedema in adult patient 05/13/2016   Malignant neoplasm of lower lobe of left lung (HCC) 02/17/2016   Malnutrition of moderate degree 01/04/2016   Goals of care, counseling/discussion    Dysphagia 12/31/2015   Mucositis due to radiation therapy 12/31/2015   Odynophagia    Throat pain in adult 12/29/2015   Anemia in chronic illness 12/16/2015   Intractable nausea and vomiting 11/13/2015   Gastritis, acute 11/13/2015   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   Lipoma of axilla 03/21/2013   Sleep disorder 03/17/2013   Health care maintenance 03/17/2013   Health maintenance examination 10/17/2011   Thalassemia 10/17/2011   Essential hypertension 10/17/2011   Hyperlipidemia 10/17/2011   Headache 10/17/2011    PCP: Evan Mackie Bar, MD  REFERRING PROVIDER: Teressa Rainell BROCKS, DO   REFERRING DIAG: Bilateral primary osteoarthritis of hip [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 Pt stated that they have maintained good compliance with current HEP.  Was dizzy trying some of the exercises last time, however feels much better today.   EVAL- Pt reports pain is the worst in the neck and is worse in the AM.  Pain is mostly in the back of the neck starting a few years ago  with no specific onset. Pain improves after being up and moving around for a while. She notes holding up to do prolonged activities is hard for  example mowing the lawn or reading.   Hips and knees have been  giving more issues about 6 weeks ago beginning of June. She reports nothing started this.  She reports about week ago the hip and knee pain has resolved, with some occasional L knee residual intermittent soreness, but notes the pain in her neck is the worst.   PERTINENT HISTORY:  Hx of Cx, see PMHx  PAIN:  Are you having pain?  Neck Yes: NPRS scale: 5/10 Last 24 hours: 2-10/10 Pain location: back of the neck  Pain description: ache, pulling  Aggravating factors: getting out of bed morning, prolonged looking down atactivities, lifting head Relieving factors: Tylenol ,   Hips / knees Yes: NPRS scale: 0/10 Last 24 hours: 0/10 Pain location: along the side of the hip Pain description: ache Aggravating factors: more activity / movement Relieving factors: Tylenol    PRECAUTIONS: Other: hx of cancer  RED FLAGS: None   WEIGHT BEARING RESTRICTIONS: No  FALLS:  Has patient fallen in last 6 months? Yes. Number of falls 3  LIVING ENVIRONMENT: Lives with: lives with their daughter Lives in: House/apartment Stairs: Yes: Internal: 12 steps; on left going up Has following equipment at home: None  OCCUPATION: retired  PLOF: Independent  PATIENT GOALS: be able to move again, get stuff done without relying on others.    OBJECTIVE:  Note: Objective measures were completed at Evaluation unless otherwise noted.  DIAGNOSTIC FINDINGS:  X-ray Cervical spine 03/26/24 IMPRESSION: Moderate degenerative disc disease at C6-7 and milder degenerative disc disease at C5-6.  X-ray bil knee 03/26/24 IMPRESSION: Mild degenerative changes of the knees.  PATIENT SURVEYS:  NDI: 22/50 NECK DISABILITY INDEX  Date: 7/15 Score  Pain intensity 3 = The pain is fairly severe at the moment  2. Personal care (washing, dressing, etc.) 0 = I can look after myself normally without causing extra pain  3. Lifting 5 = I cannot lift or carry anything   4. Reading 2 =  I can read as much as I want with moderate  pain in my neck  5. Headaches 0 = I have no headaches at all  6. Concentration 0 =  I can concentrate fully when I want to with no difficulty  7. Work 2 = I can do most of my usual work, but no more  8. Driving 0 = I can drive my car without any neck pain  9. Sleeping 5 =  My sleep is completely disturbed (5-7 hrs sleepless)   10. Recreation 5 = I can't do any recreation activities at all  Total 22/50   Minimum Detectable Change (90% confidence): 5 points or 10% points  Reassessed on 05/07/24 NDI 9/50 = 18%  COGNITION: Overall cognitive status: Within functional limits for tasks assessed     SENSATION: Not tested   POSTURE: rounded shoulders, forward head, increased thoracic kyphosis, and    PALPATION: TTP with multiple trigger points along the bil cervicothoracic paraspinals with R>L, bil upper trap tightness, levator scapulae tenderness and sub-occipital tightness, increaed thoracic kyphosis  CERVICAL ROM:   Active ROM A/PROM (deg) 04/09/2024  Flexion 40  Extension 20  Right lateral flexion 1  Left lateral flexion 10  Right rotation 55  Left rotation 55   (Blank rows = not tested)  Note: resting postioning is 12 degrees of flexion          Concordant sx noted with end range flexion.   UE ROM:  Active ROM Right 04/09/2024 Left 04/09/2024  Shoulder flexion [facility]  Shoulder extension [facility]  Shoulder abduction [facility]  Shoulder adduction [facility]  Shoulder extension    Shoulder internal rotation [facility]  Shoulder external rotation [facility]  Elbow flexion    Elbow extension    Wrist flexion    Wrist extension    Wrist ulnar deviation    Wrist radial deviation    Wrist pronation    Wrist supination     (Blank rows = not tested)  UE MMT:  MMT Right 04/09/2024 Left 04/09/2024  Shoulder flexion 4 4-  Shoulder extension 4 4-  Shoulder abduction 4 4-  Shoulder adduction 4 4  Shoulder extension    Shoulder internal rotation 4 4-  Shoulder external rotation 4  4-  Middle trapezius    Lower trapezius    Elbow flexion    Elbow extension    Wrist flexion    Wrist extension    Wrist ulnar deviation    Wrist radial deviation    Wrist pronation    Wrist supination    Grip strength     (Blank rows = not tested)  CERVICAL SPECIAL TESTS:  Cranial cervical flexion test: able to hold 20 seconds    LOWER EXTREMITY ROM:     Active  Right eval Left eval  Hip flexion    Hip extension    Hip abduction    Hip adduction    Hip internal rotation    Hip external rotation    Knee flexion    Knee extension    Ankle dorsiflexion    Ankle plantarflexion    Ankle inversion    Ankle eversion     (Blank rows = not tested)  LOWER EXTREMITY MMT:    MMT Right eval Left eval  Hip flexion    Hip extension    Hip abduction    Hip adduction    Hip internal rotation    Hip external rotation    Knee flexion    Knee extension    Ankle dorsiflexion    Ankle plantarflexion    Ankle inversion    Ankle eversion     (Blank rows = not tested)   GAIT: Distance walked: from waiting room to tx room Assistive device utilized: None Level of assistance: Complete Independence Comments: postural sway noted with limited trunk rotation   TREATMENT :  [facility] Adult PT Treatment:                                                DATE: 05/09/2024 Therapeutic Activity: NuStep 8' lvl 5 for activity tolerance Corner pec stretch at 30,60,90d of abduction 1' ea. Anchored row with isometric chin tucks 2x15, hold 2s, GTB Trap set at wall 2x12, hold 3s, 1st set in elevation, 2nd in depression OMEGA pull down 2x10, 30lbs    [facility] Adult PT Treatment:                                                DATE: 05/07/24 MTPR along bil upp trap Sub-occipital release UBE L 3 x 5 min  (FWD/ BWD x 2:30) Upper trap stretch 2 x 30 sec Posture education and benefits of taking short breaks Standing pec  stretch in corner 2 x 30 sec Tools that can be purchased to assist with  self TP release.   [facility] Adult PT Treatment:                                                DATE: 05/01/24 Nu-step L5 x 6 min LE only Sit to stand 1 x 10, 1 x 10 with RTB around knees Alternating marching while seated on ifiter 2 x 12 Palloff press 2 x 12 with GTB Alternating toe taps on 4 inch step 3 x 20  Rhomberg positoin 1 x 30 sec static, 1 x 30 sec with head turns Modified Tandem 3 x 20 sec alternating lead foot.  Standing forward reaching 3 x 10 with cone gradually pushed further requiring trunk lean   PATIENT EDUCATION:  Education details: evaluation findings, POC, goals, HEP with proper form/ rationale.  Person educated: Patient Education method: Explanation, Verbal cues, and Handouts Education comprehension: verbalized understanding  HOME EXERCISE PROGRAM: Access Code: [email or web address] URL: [email or web address] Date: 05/07/2024 Prepared by: Joneen Fresh  Exercises - Seated or Standing Cervical Retraction  - 1 x daily - 7 x weekly - 2 sets - 10 reps - 5 sec hold - Supine Chin Tuck with Towel  - 1 x daily - 7 x weekly - 2 sets - 10 reps - 10 hold - Supine DNF Liftoffs  - 1 x daily - 7 x weekly - 3 sets - 5 reps - 5-10 hold - Thoracic Extension Mobilization with Noodle  - 1 x daily - 7 x weekly - 2 sets - 10 reps - 5 seconds hold - Seated Upper Trapezius Stretch  - 1 x daily - 7 x weekly - 2 sets - 2 reps - 30 seconds hold - Gentle Levator Scapulae Stretch  - 1 x daily - 7 x weekly - 2 sets - 2 reps - 30 sec  hold - Shoulder External Rotation and Scapular Retraction with Resistance  - 1 x daily - 7 x weekly - 3 sets - 10 reps - Seated Shoulder Horizontal Abduction with Resistance  - 1 x daily - 7 x weekly - 2 sets - 12 reps - Supine Pectoralis Stretch  - 2-3 x daily - 7 x weekly - 2 sets - 2 reps - 30-60 sec hold - Doorway Pec Stretch at 90 Degrees Abduction  - 2 x daily - 7 x weekly - 2 sets - 2 reps - 30 seconds hold - Corner Pec Major Stretch  - 2 x daily - 7 x  weekly - 2 sets - 2 reps - 30 hold   For LE/ hips Access Code: [email or web address] URL: [email or web address] Date: 04/29/2024 Prepared by: Joneen Fresh  Exercises - Standing Hip Abduction with Resistance at Ankles and Counter Support  - 1 x daily - 7 x weekly - 2-3 sets - 10-15 reps - Standing March  - 1 x daily - 7 x weekly - 2-3 sets - 10-15 reps - Sit to Stand Without Arm Support  - 1 x daily - 7 x weekly - 2 sets - 10 reps - Corner Balance Feet Together With Eyes Open  - 1 x daily - 7 x weekly - 2 sets - 10 reps  ASSESSMENT:  CLINICAL IMPRESSION: Pt attended physical therapy session for continuation of treatment regarding neck and postural dysfunction. Today's treatment  focused on improvement of  postural endurance, shoulder girdle strength/motility and education surrounding postural mechanics. Pt is progressing with reported pain levels at 2/10 today.  Pt showed good tolerance to administered treatment with no adverse effects by the end of session. Skilled intervention was utilized via activity modification for pt tolerance with task completion, functional progression/regression promoting best outcomes inline with current rehab goals, as well as minimal verbal/tactile cuing alongside no physical assistance for safe and appropriate performance of today's activities. Continue to progress as tolerated within current POC focus.    EVAL- Patient is a 78 y.o. F who was seen today for physical therapy evaluation and treatment for referral dx of bil hip OA, bil knee pain, and neck pain. Mrs Debra Farrell reports her hips/ knees started bothering her 6 weeks ago with no specific onset but has since reduced activity and noted she currently hasn't had any pain for over a week, with the exception of intermittent L knee pain. She reports her CC is Neck pain and limited activity as a result that is worst in the AM or with prolonged positioning/ activities. She demonstrates limited cervical extension  and sidebending and is a resting position of 12 degrees of flexion. She exhibits a classic upper cross syndrome with a forward head posture and forward rolled shoulders and significant thoracic kyphosis which potentially could be attributed partially to her PMhx of throat and lung cx. She would benefit from physical therapy to reduce neck pain, strengthening of the posterior chain, maximize cervical and thoracic mobility, maximize postural efficiency and her overall function by addressing the deficits listed .  OBJECTIVE IMPAIRMENTS: decreased endurance, decreased ROM, increased fascial restrictions, increased muscle spasms, improper body mechanics, postural dysfunction, and pain.   ACTIVITY LIMITATIONS: carrying, lifting, and reading, yard work   PARTICIPATION LIMITATIONS: community activity and yard work  PERSONAL FACTORS: Age, Past/current experiences, and 1-2 comorbidities: hx of Cancer in both throat/ lung are also affecting patient's functional outcome.   REHAB POTENTIAL: Good  CLINICAL DECISION MAKING: Evolving/moderate complexity  EVALUATION COMPLEXITY: Moderate   GOALS: Goals reviewed with patient? Yes  SHORT TERM GOALS: Target date: 05/07/2024  Pt to be IND with initial HEP for therapeutic progression Baseline: Goal status: MET  2.  Pt to verbalize/ demo efficient posture in sitting/ standing to reduce stress/ strain on her neck/ thoracic spine.  Baseline:  Goal status: MET  3.  Pt to improve her cervical resting postion to </= 8 degrees of flexion to reduce stress on cervical paraspinal/ sub-occipitals  Baseline:  Goal status: ongoing   4.  Pt to report pain to </= 7/10 at max to demo improving condition Baseline:  Goal status: MET   LONG TERM GOALS: Target date: 06/04/2024  Improve cervical sidebending to >/= 15 degrees bil and maintain current cervical mobility with </= 2/10 max pain for safety with ADLs and driving Baseline:  Goal status: INITIAL  2.  Pt to be  able to return to yard work, reading for  >/= 45 min reporting pain as </= slight (per the NDI) for pt's personal goals.  Baseline:  Goal status: INITIAL  3.  Pt to be able to lift/ carrying items with report of minimal pain for returning to independent activity and maximize QOL Baseline:  Goal status: INITIAL  4.  Pt to be able to walk/ stand and navigate in home steps with no report of limitations with strength or feeling of balance for safety.  Baseline:  Goal status: INITIAL  5.  Improve NDI to </= 10/50 to demo a measureable improvement in her function.  Baseline:  Goal status: INITIAL  6.  Pt to be IND with all HEP and is able to maintain and progress their current LOF IND.  Baseline:  Goal status: INITIAL  PLAN:  PT FREQUENCY: 1-2x/week  PT DURATION: 8 weeks  PLANNED INTERVENTIONS: 97110-Therapeutic exercises, 97530- Therapeutic activity, [id number]- Neuromuscular re-education, 97535- Self Care, [id number]- Manual therapy, [id number]- Gait training, [phone]- Traction (mechanical), [phone] (1-2 muscles), 20561 (3+ muscles)- Dry Needling, Taping, Spinal mobilization, Cryotherapy, and Moist heat.  PLAN FOR NEXT SESSION: review/ update HEP PRN, posterior chain activation, pec stretch,STW along cervical paraspinals.   ]Veronia Laprise Abran, PT, DPT 05/09/2024, 9:55 AM

## 2024-05-13 ENCOUNTER — Ambulatory Visit: Admitting: Physical Therapy

## 2024-05-13 DIAGNOSIS — M542 Cervicalgia: Secondary | ICD-10-CM | POA: Diagnosis not present

## 2024-05-13 DIAGNOSIS — M25561 Pain in right knee: Secondary | ICD-10-CM

## 2024-05-13 DIAGNOSIS — R293 Abnormal posture: Secondary | ICD-10-CM | POA: Diagnosis not present

## 2024-05-13 DIAGNOSIS — M25552 Pain in left hip: Secondary | ICD-10-CM | POA: Diagnosis not present

## 2024-05-13 DIAGNOSIS — M25551 Pain in right hip: Secondary | ICD-10-CM | POA: Diagnosis not present

## 2024-05-13 DIAGNOSIS — M25562 Pain in left knee: Secondary | ICD-10-CM | POA: Diagnosis not present

## 2024-05-13 NOTE — Therapy (Signed)
 OUTPATIENT PHYSICAL THERAPY CERVICAL  TREATMENT   Patient Name: Debra Farrell MRN: [id number] DOB:02/28/1946, 78 y.o., female Today's Date: 05/13/2024  END OF SESSION:  PT End of Session - 05/13/24 1026     Visit Number 9    Number of Visits 17    Date for PT Re-Evaluation 06/04/24    Authorization Type [employer] MCR    Progress Note Due on Visit 10    PT Start Time 1000    PT Stop Time 1038    PT Time Calculation (min) 38 min    Activity Tolerance Patient tolerated treatment well    Behavior During Therapy WFL for tasks assessed/performed                  Past Medical History:  Diagnosis Date   Abdominal abscess 11/30/2015   Anemia in chronic illness 12/16/2015   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   Eczema    GERD (gastroesophageal reflux disease)    Hx of radiation therapy 11/11/15- 01/05/2016   Base of Tongue and bilateral neck   Hx of radiation therapy 03/14/16- 03/23/16   Left Lower Lung   Hyperlipidemia    Hypertension    no meds   Infection with methicillin-resistant Staphylococcus aureus (MRSA) 11/30/2015   Intractable nausea and vomiting 11/13/2015   Laceration 04/2014   around R ear, for falling out of bed & hitting nightstand   Lung cancer (HCC) dx'd 01/2016   Nausea without vomiting 12/16/2015   Status post dilation of esophageal narrowing    Thalassemia minor    Throat cancer (HCC)    Throat pain in adult 12/29/2015   Past Surgical History:  Procedure Laterality Date   ABDOMINAL HYSTERECTOMY  1990   partial   DIRECT LARYNGOSCOPY N/A 10/28/2015   Procedure: DIRECT LARYNGOSCOPY WITH BIOPSY;  Surgeon: Marlyce Finer, MD;  Location: [facility] OR;  Service: ENT;  Laterality: N/A;   DIRECT LARYNGOSCOPY N/A 03/13/2017   Procedure: DIRECT LARYNGOSCOPY;  Surgeon: Finer Marlyce, MD;  Location: [facility];  Service: ENT;  Laterality: N/A;   DIRECT LARYNGOSCOPY N/A 01/18/2018   Procedure: DIRECT LARYNGOSCOPY;  Surgeon: Finer Marlyce, MD;   Location: [facility] OR;  Service: ENT;  Laterality: N/A;   ESOPHAGOSCOPY N/A 10/28/2015   Procedure: ESOPHAGOSCOPY;  Surgeon: Marlyce Finer, MD;  Location: [facility] OR;  Service: ENT;  Laterality: N/A;   ESOPHAGOSCOPY WITH DILITATION N/A 03/13/2017   Procedure: ESOPHAGOSCOPY WITH DILITATION;  Surgeon: Finer Marlyce, MD;  Location: [facility];  Service: ENT;  Laterality: N/A;   ESOPHAGOSCOPY WITH DILITATION N/A 01/18/2018   Procedure: ESOPHAGOSCOPY WITH DILITATION POSSIBLE BOTOX ;  Surgeon: Finer Marlyce, MD;  Location: [facility] OR;  Service: ENT;  Laterality: N/A;   INCONTINENCE SURGERY  1990, [phone]   IR CV LINE INJECTION  06/28/2018   IR CV LINE INJECTION  07/23/2018   IR REMOVAL TUN ACCESS W/ PORT W/O FL MOD SED  03/21/2019   IR REMOVE CV FIBRIN SHEATH  07/23/2018   IR REPLACE G-TUBE SIMPLE WO FLUORO  06/19/2017   IR REPLACE G-TUBE SIMPLE WO FLUORO  09/05/2017   IR REPLACE G-TUBE SIMPLE WO FLUORO  12/21/2017   IR REPLC GASTRO/COLONIC TUBE PERCUT W/FLUORO  07/02/2018   IR US  GUIDE VASC ACCESS RIGHT  07/23/2018   LARYNGOSCOPY AND BRONCHOSCOPY N/A 10/28/2015   Procedure: BRONCHOSCOPY;  Surgeon: Marlyce Finer, MD;  Location: [facility] OR;  Service: ENT;  Laterality: N/A;   MULTIPLE EXTRACTIONS WITH ALVEOLOPLASTY N/A 10/28/2015  Procedure: Extraction of tooth #'s 2-12, 14,15,17,18,20-29, 31 with alveoloplasy and mandibular left torus reduction;  Surgeon: Tanda JULIANNA Fanny, DDS;  Location: [facility] OR;  Service: Oral Surgery;  Laterality: N/A;   port-a-cath insertion     RECTOCELE REPAIR     TONSILLECTOMY     as a child   UPPER GASTROINTESTINAL ENDOSCOPY     urocele     correction surgery   Patient Active Problem List   Diagnosis Date Noted   Syncope 04/28/2022   Urinary incontinence 04/28/2022   Subclinical hypothyroidism 04/05/2021   Lipoma 04/05/2021   Healthcare maintenance 04/05/2021   Elevated blood pressure reading 04/05/2021   Thyroid  nodule 02/28/2019   Acquired hypothyroidism  03/19/2018   Metastasis to lung (HCC) 09/02/2016   Pancytopenia, acquired (HCC) 09/02/2016   Preventive measure 09/02/2016   Lymphedema in adult patient 05/13/2016   Malignant neoplasm of lower lobe of left lung (HCC) 02/17/2016   Malnutrition of moderate degree 01/04/2016   Goals of care, counseling/discussion    Dysphagia 12/31/2015   Mucositis due to radiation therapy 12/31/2015   Odynophagia    Throat pain in adult 12/29/2015   Anemia in chronic illness 12/16/2015   Intractable nausea and vomiting 11/13/2015   Gastritis, acute 11/13/2015   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   Lipoma of axilla 03/21/2013   Sleep disorder 03/17/2013   Health care maintenance 03/17/2013   Health maintenance examination 10/17/2011   Thalassemia 10/17/2011   Essential hypertension 10/17/2011   Hyperlipidemia 10/17/2011   Headache 10/17/2011    PCP: Rolla Servidio Bar, MD  REFERRING PROVIDER: Teressa Rainell BROCKS, DO   REFERRING DIAG: Bilateral primary osteoarthritis of hip [M16.0], Neck pain [M54.2], Acute pain of both knees [M25.561, M25.562]   Rationale for Evaluation and Treatment: Rehabilitation  THERAPY DIAG:  Acute pain of both knees  Pain of both hip joints  Cervicalgia  Abnormal posture  ONSET DATE: Neck pain intermittent couple of years  SUBJECTIVE:                                                                                                                                                                                           SUBJECTIVE STATEMENT: Pt attended today's session with reports of 0/10 pain. Pt stated that they have maintained good compliance with current HEP.  Just a little sore after working outside in the yard over the weekend.  EVAL- Pt reports pain is the worst in the neck and is worse in the AM.  Pain is mostly in the back of the neck starting a few years ago  with no specific onset. Pain improves after being  up and moving around for a while. She notes  holding up to do prolonged activities is hard for example mowing the lawn or reading.   Hips and knees have been giving more issues about 6 weeks ago beginning of June. She reports nothing started this.  She reports about week ago the hip and knee pain has resolved, with some occasional L knee residual intermittent soreness, but notes the pain in her neck is the worst.   PERTINENT HISTORY:  Hx of Cx, see PMHx  PAIN:  Are you having pain?  Neck Yes: NPRS scale: 5/10 Last 24 hours: 2-10/10 Pain location: back of the neck  Pain description: ache, pulling  Aggravating factors: getting out of bed morning, prolonged looking down atactivities, lifting head Relieving factors: Tylenol ,   Hips / knees Yes: NPRS scale: 0/10 Last 24 hours: 0/10 Pain location: along the side of the hip Pain description: ache Aggravating factors: more activity / movement Relieving factors: Tylenol    PRECAUTIONS: Other: hx of cancer  RED FLAGS: None   WEIGHT BEARING RESTRICTIONS: No  FALLS:  Has patient fallen in last 6 months? Yes. Number of falls 3  LIVING ENVIRONMENT: Lives with: lives with their daughter Lives in: House/apartment Stairs: Yes: Internal: 12 steps; on left going up Has following equipment at home: None  OCCUPATION: retired  PLOF: Independent  PATIENT GOALS: be able to move again, get stuff done without relying on others.    OBJECTIVE:  Note: Objective measures were completed at Evaluation unless otherwise noted.  DIAGNOSTIC FINDINGS:  X-ray Cervical spine 03/26/24 IMPRESSION: Moderate degenerative disc disease at C6-7 and milder degenerative disc disease at C5-6.  X-ray bil knee 03/26/24 IMPRESSION: Mild degenerative changes of the knees.  PATIENT SURVEYS:  NDI: 22/50 NECK DISABILITY INDEX  Date: 7/15 Score  Pain intensity 3 = The pain is fairly severe at the moment  2. Personal care (washing, dressing, etc.) 0 = I can look after myself normally without causing extra  pain  3. Lifting 5 = I cannot lift or carry anything   4. Reading 2 =  I can read as much as I want with moderate pain in my neck  5. Headaches 0 = I have no headaches at all  6. Concentration 0 =  I can concentrate fully when I want to with no difficulty  7. Work 2 = I can do most of my usual work, but no more  8. Driving 0 = I can drive my car without any neck pain  9. Sleeping 5 =  My sleep is completely disturbed (5-7 hrs sleepless)   10. Recreation 5 = I can't do any recreation activities at all  Total 22/50   Minimum Detectable Change (90% confidence): 5 points or 10% points  Reassessed on 05/07/24 NDI 9/50 = 18%  COGNITION: Overall cognitive status: Within functional limits for tasks assessed     SENSATION: Not tested   POSTURE: rounded shoulders, forward head, increased thoracic kyphosis, and    PALPATION: TTP with multiple trigger points along the bil cervicothoracic paraspinals with R>L, bil upper trap tightness, levator scapulae tenderness and sub-occipital tightness, increaed thoracic kyphosis  CERVICAL ROM:   Active ROM A/PROM (deg) 04/09/2024  Flexion 40  Extension 20  Right lateral flexion 1  Left lateral flexion 10  Right rotation 55  Left rotation 55   (Blank rows = not tested)  Note: resting postioning is 12 degrees of flexion          Concordant  sx noted with end range flexion.   UE ROM:  Active ROM Right 04/09/2024 Left 04/09/2024  Shoulder flexion [facility]  Shoulder extension [facility]  Shoulder abduction [facility] WFL  Shoulder adduction [facility]  Shoulder extension    Shoulder internal rotation [facility] WFL  Shoulder external rotation [facility] WFL  Elbow flexion    Elbow extension    Wrist flexion    Wrist extension    Wrist ulnar deviation    Wrist radial deviation    Wrist pronation    Wrist supination     (Blank rows = not tested)  UE MMT:  MMT Right 04/09/2024 Left 04/09/2024  Shoulder flexion 4 4-  Shoulder extension 4 4-  Shoulder abduction 4  4-  Shoulder adduction 4 4  Shoulder extension    Shoulder internal rotation 4 4-  Shoulder external rotation 4 4-  Middle trapezius    Lower trapezius    Elbow flexion    Elbow extension    Wrist flexion    Wrist extension    Wrist ulnar deviation    Wrist radial deviation    Wrist pronation    Wrist supination    Grip strength     (Blank rows = not tested)  CERVICAL SPECIAL TESTS:  Cranial cervical flexion test: able to hold 20 seconds    LOWER EXTREMITY ROM:     Active  Right eval Left eval  Hip flexion    Hip extension    Hip abduction    Hip adduction    Hip internal rotation    Hip external rotation    Knee flexion    Knee extension    Ankle dorsiflexion    Ankle plantarflexion    Ankle inversion    Ankle eversion     (Blank rows = not tested)  LOWER EXTREMITY MMT:    MMT Right eval Left eval  Hip flexion    Hip extension    Hip abduction    Hip adduction    Hip internal rotation    Hip external rotation    Knee flexion    Knee extension    Ankle dorsiflexion    Ankle plantarflexion    Ankle inversion    Ankle eversion     (Blank rows = not tested)   GAIT: Distance walked: from waiting room to tx room Assistive device utilized: None Level of assistance: Complete Independence Comments: postural sway noted with limited trunk rotation   TREATMENT :  [facility] Adult PT Treatment:                                                DATE: 05/09/2024 Therapeutic Activity: NuStep 8' lvl 5 for activity tolerance doorway pec stretch at 30,60,90d of abduction and cervical rotation 1' ea. Anchored row with isometric chin tucks 2x15, hold 2s, BTB Thoracic ext over foam roller in supine 2x20, hold 2s Prone hold in neutral cervical with thoracic extension 5x30s, cue for chin tuck/cervical posturing PRN   [facility] Adult PT Treatment:                                                DATE: 05/09/2024 Therapeutic Activity: NuStep 8' lvl 5 for activity  tolerance Corner pec stretch at 30,60,90d of abduction 1' ea. Anchored row with isometric chin tucks 2x15, hold 2s, GTB Trap set at wall 2x12, hold 3s, 1st set in elevation, 2nd in depression OMEGA pull down 2x10, 30lbs    [facility] Adult PT Treatment:                                                DATE: 05/07/24 MTPR along bil upp trap Sub-occipital release UBE L 3 x 5 min  (FWD/ BWD x 2:30) Upper trap stretch 2 x 30 sec Posture education and benefits of taking short breaks Standing pec stretch in corner 2 x 30 sec Tools that can be purchased to assist with self TP release.   [facility] Adult PT Treatment:                                                DATE: 05/01/24 Nu-step L5 x 6 min LE only Sit to stand 1 x 10, 1 x 10 with RTB around knees Alternating marching while seated on ifiter 2 x 12 Palloff press 2 x 12 with GTB Alternating toe taps on 4 inch step 3 x 20  Rhomberg positoin 1 x 30 sec static, 1 x 30 sec with head turns Modified Tandem 3 x 20 sec alternating lead foot.  Standing forward reaching 3 x 10 with cone gradually pushed further requiring trunk lean   PATIENT EDUCATION:  Education details: evaluation findings, POC, goals, HEP with proper form/ rationale.  Person educated: Patient Education method: Explanation, Verbal cues, and Handouts Education comprehension: verbalized understanding  HOME EXERCISE PROGRAM: Access Code: [email or web address] URL: [email or web address] Date: 05/07/2024 Prepared by: Joneen Fresh  Exercises - Seated or Standing Cervical Retraction  - 1 x daily - 7 x weekly - 2 sets - 10 reps - 5 sec hold - Supine Chin Tuck with Towel  - 1 x daily - 7 x weekly - 2 sets - 10 reps - 10 hold - Supine DNF Liftoffs  - 1 x daily - 7 x weekly - 3 sets - 5 reps - 5-10 hold - Thoracic Extension Mobilization with Noodle  - 1 x daily - 7 x weekly - 2 sets - 10 reps - 5 seconds hold - Seated Upper Trapezius Stretch  - 1 x daily - 7 x weekly - 2 sets - 2  reps - 30 seconds hold - Gentle Levator Scapulae Stretch  - 1 x daily - 7 x weekly - 2 sets - 2 reps - 30 sec  hold - Shoulder External Rotation and Scapular Retraction with Resistance  - 1 x daily - 7 x weekly - 3 sets - 10 reps - Seated Shoulder Horizontal Abduction with Resistance  - 1 x daily - 7 x weekly - 2 sets - 12 reps - Supine Pectoralis Stretch  - 2-3 x daily - 7 x weekly - 2 sets - 2 reps - 30-60 sec hold - Doorway Pec Stretch at 90 Degrees Abduction  - 2 x daily - 7 x weekly - 2 sets - 2 reps - 30 seconds hold - Corner Pec Major Stretch  - 2 x daily - 7 x weekly - 2 sets - 2 reps - 30  hold   For LE/ hips Access Code: [email or web address] URL: [email or web address] Date: 04/29/2024 Prepared by: Joneen Fresh  Exercises - Standing Hip Abduction with Resistance at Ankles and Counter Support  - 1 x daily - 7 x weekly - 2-3 sets - 10-15 reps - Standing March  - 1 x daily - 7 x weekly - 2-3 sets - 10-15 reps - Sit to Stand Without Arm Support  - 1 x daily - 7 x weekly - 2 sets - 10 reps - Corner Balance Feet Together With Eyes Open  - 1 x daily - 7 x weekly - 2 sets - 10 reps  ASSESSMENT:  CLINICAL IMPRESSION: Pt attended physical therapy session for continuation of treatment regarding neck and postural dysfunction. Today's treatment focused on improvement of  postural endurance, shoulder girdle strength/motility, cervical stability,and thoracic mobility. Pt is progressing with reported pain levels at 0/10 today.  Pt showed good tolerance to administered treatment with no adverse effects by the end of session. Skilled intervention was utilized via activity modification for pt tolerance with task completion, functional progression/regression promoting best outcomes inline with current rehab goals, as well as minimal verbal/tactile cuing alongside no physical assistance for safe and appropriate performance of today's activities. Re-eval next visit    EVAL- Patient is a 78 y.o.  F who was seen today for physical therapy evaluation and treatment for referral dx of bil hip OA, bil knee pain, and neck pain. Mrs Brunke reports her hips/ knees started bothering her 6 weeks ago with no specific onset but has since reduced activity and noted she currently hasn't had any pain for over a week, with the exception of intermittent L knee pain. She reports her CC is Neck pain and limited activity as a result that is worst in the AM or with prolonged positioning/ activities. She demonstrates limited cervical extension and sidebending and is a resting position of 12 degrees of flexion. She exhibits a classic upper cross syndrome with a forward head posture and forward rolled shoulders and significant thoracic kyphosis which potentially could be attributed partially to her PMhx of throat and lung cx. She would benefit from physical therapy to reduce neck pain, strengthening of the posterior chain, maximize cervical and thoracic mobility, maximize postural efficiency and her overall function by addressing the deficits listed .  OBJECTIVE IMPAIRMENTS: decreased endurance, decreased ROM, increased fascial restrictions, increased muscle spasms, improper body mechanics, postural dysfunction, and pain.   ACTIVITY LIMITATIONS: carrying, lifting, and reading, yard work   PARTICIPATION LIMITATIONS: community activity and yard work  PERSONAL FACTORS: Age, Past/current experiences, and 1-2 comorbidities: hx of Cancer in both throat/ lung are also affecting patient's functional outcome.   REHAB POTENTIAL: Good  CLINICAL DECISION MAKING: Evolving/moderate complexity  EVALUATION COMPLEXITY: Moderate   GOALS: Goals reviewed with patient? Yes  SHORT TERM GOALS: Target date: 05/07/2024  Pt to be IND with initial HEP for therapeutic progression Baseline: Goal status: MET  2.  Pt to verbalize/ demo efficient posture in sitting/ standing to reduce stress/ strain on her neck/ thoracic spine.   Baseline:  Goal status: MET  3.  Pt to improve her cervical resting postion to </= 8 degrees of flexion to reduce stress on cervical paraspinal/ sub-occipitals  Baseline:  Goal status: ongoing   4.  Pt to report pain to </= 7/10 at max to demo improving condition Baseline:  Goal status: MET   LONG TERM GOALS: Target date: 06/04/2024  Improve cervical sidebending to >/= 15 degrees bil and  maintain current cervical mobility with </= 2/10 max pain for safety with ADLs and driving Baseline:  Goal status: INITIAL  2.  Pt to be able to return to yard work, reading for  >/= 45 min reporting pain as </= slight (per the NDI) for pt's personal goals.  Baseline:  Goal status: INITIAL  3.  Pt to be able to lift/ carrying items with report of minimal pain for returning to independent activity and maximize QOL Baseline:  Goal status: INITIAL  4.  Pt to be able to walk/ stand and navigate in home steps with no report of limitations with strength or feeling of balance for safety.  Baseline:  Goal status: INITIAL  5.  Improve NDI to </= 10/50 to demo a measureable improvement in her function.  Baseline:  Goal status: INITIAL  6.  Pt to be IND with all HEP and is able to maintain and progress their current LOF IND.  Baseline:  Goal status: INITIAL  PLAN:  PT FREQUENCY: 1-2x/week  PT DURATION: 8 weeks  PLANNED INTERVENTIONS: 97110-Therapeutic exercises, 97530- Therapeutic activity, [id number]- Neuromuscular re-education, 97535- Self Care, [id number]- Manual therapy, [id number]- Gait training, [phone]- Traction (mechanical), [phone] (1-2 muscles), [id number] (3+ muscles)- Dry Needling, Taping, Spinal mobilization, Cryotherapy, and Moist heat.  PLAN FOR NEXT SESSION: review/ update HEP PRN, posterior chain activation/mobility, pec stretch,STW along cervical paraspinals.   ]Joash Tony Abran, PT, DPT 05/13/2024, 10:38 AM

## 2024-05-14 ENCOUNTER — Ambulatory Visit: Admitting: Family Medicine

## 2024-05-14 ENCOUNTER — Other Ambulatory Visit (HOSPITAL_COMMUNITY): Payer: Self-pay

## 2024-05-14 ENCOUNTER — Encounter: Payer: Self-pay | Admitting: Family Medicine

## 2024-05-14 VITALS — BP 131/64 | Ht 66.0 in | Wt 122.0 lb

## 2024-05-14 DIAGNOSIS — M25562 Pain in left knee: Secondary | ICD-10-CM

## 2024-05-14 DIAGNOSIS — M160 Bilateral primary osteoarthritis of hip: Secondary | ICD-10-CM | POA: Diagnosis not present

## 2024-05-14 DIAGNOSIS — M25561 Pain in right knee: Secondary | ICD-10-CM | POA: Diagnosis not present

## 2024-05-14 DIAGNOSIS — M5030 Other cervical disc degeneration, unspecified cervical region: Secondary | ICD-10-CM | POA: Diagnosis not present

## 2024-05-14 MED ORDER — TIZANIDINE HCL 2 MG PO TABS
2.0000 mg | ORAL_TABLET | Freq: Every evening | ORAL | 1 refills | Status: AC
  Filled 2024-05-14: qty 30, 15d supply, fill #0

## 2024-05-14 NOTE — Progress Notes (Signed)
 DATE OF VISIT: 05/14/2024        Debra Farrell DOB: Aug 06, 1946 MRN: [id number]  CC:  Neck pain  History of present Illness: Debra Farrell is a 78 y.o. female who presents for a follow-up evaluation of continued neck pain, she was last seen in clinic on 03/26/2024.  Patient reports that her knee and hip pain have completely improved after her short course of steroids and physical therapy. However, she continues to have pain in her neck. She has found physical therapy to be beneficial, however, she recently started seeing a new physical therapist and has been doing different exercises which have not helped with her neck pain as much. She takes Tylenol  as needed but does not believe it helps with the pain. She says that she reads a lot and spends a lot of time with her head/neck downwards. X-ray of her cervical spine showed moderate degenerative disc disease at C6-C7 and milder degenerative disc disease at C5-C6.   Medications:  Outpatient Encounter Medications as of 05/14/2024  Medication Sig   tiZANidine  (ZANAFLEX ) 2 MG tablet Take 1-2 tablets (2-4 mg total) by mouth at bedtime.   [DISCONTINUED] tiZANidine  (ZANAFLEX ) 2 MG tablet Take 1-2 tablets (2-4 mg total) by mouth at bedtime.   No facility-administered encounter medications on file as of 05/14/2024.    Allergies: is allergic to chlorhexidine  gluconate and morphine and codeine .  Physical Examination: Vitals: BP 131/64   Ht 5' 6 (1.676 m)   Wt 122 lb (55.3 kg)   BMI 19.69 kg/m  GENERAL:  Debra Farrell is a 78 y.o. female appearing their stated age, alert and oriented x 3, in no apparent distress.  SKIN: no rashes or lesions, skin clean, dry, intact MSK: Cervical spine Inspection: There is mild prominence of the paraspinal muscles bilaterally. No bony abnormalities appreciated. Palpation: There is tenderness to palpation of the paraspinal muscles bilaterally, right worse than left. There is no tenderness to palpation along midline of  the cervical spine.  ROM: FROM with neck flexion. Limited range of motion with next extension and rightward/leftward rotation by roughly 20-30 degrees.  Strength: 5/5 upper extremity and grip strength Special Tests: Spurling's negative bilaterally  NEURO: sensation intact to light touch, DTR 2/4 biceps, triceps, and brachioradialis  VASC: pulses 2+ and symmetric brachial pulses bilaterally  Radiology: 03/26/2024 XRAY Cervical Spine: Moderate degenerative disc disease at C6-C7 and milder degenerative disc disease at C5-C6.   Assessment & Plan DDD (degenerative disc disease), cervical - Patient's neck pain pain appears to be due to a combination of muscle tightness/spasm and arthritis given the nature of her symptoms and findings on exam today.   PLAN: Discussed with patient that we will treat with a combination of formal physical therapy and muscle relaxants. Recommended patient speak with her physical therapist about making modifications to her current treatment plan given the re-emergence of her neck pain with the changes in her physical therapy regimen. Secondly, will provide a prescription for Zanaflex  to be taken at bedtime for pain relief. Recommended patient continue to work on posture while reading and throughout the day. Lastly, discussed that we may need to consider MRI in the future if her symptoms were to suddenly worsen or fail to respond to treatment. Advised patient to follow-up in 6-8 weeks for re-evaluation of progress.   - Formal physical therapy  - Zanaflex  nightly at bedtime  - NSAIDs and heat as needed  - Continue to work on/be mindful of posture   -  Consider MRI in the future if condition continues to worsen  - Follow-up in 6-8 weeks  Patient expressed understanding & agreement with above.  Signe Ravel, MS4 [facility]

## 2024-05-16 ENCOUNTER — Encounter: Payer: Self-pay | Admitting: Physical Therapy

## 2024-05-16 ENCOUNTER — Ambulatory Visit: Admitting: Physical Therapy

## 2024-05-16 DIAGNOSIS — M25561 Pain in right knee: Secondary | ICD-10-CM | POA: Diagnosis not present

## 2024-05-16 DIAGNOSIS — M25551 Pain in right hip: Secondary | ICD-10-CM

## 2024-05-16 DIAGNOSIS — R293 Abnormal posture: Secondary | ICD-10-CM | POA: Diagnosis not present

## 2024-05-16 DIAGNOSIS — M542 Cervicalgia: Secondary | ICD-10-CM | POA: Diagnosis not present

## 2024-05-16 DIAGNOSIS — M25552 Pain in left hip: Secondary | ICD-10-CM | POA: Diagnosis not present

## 2024-05-16 DIAGNOSIS — M25562 Pain in left knee: Secondary | ICD-10-CM | POA: Diagnosis not present

## 2024-05-16 NOTE — Therapy (Signed)
 OUTPATIENT PHYSICAL THERAPY Progress note   Patient Name: Debra Farrell MRN: [id number] DOB:[birth year], 78 y.o., female Today's Date: 05/16/2024  END OF SESSION:  PT End of Session - 05/16/24 0959     Visit Number 10    Number of Visits 17    Date for PT Re-Evaluation 06/04/24    Progress Note Due on Visit 10    PT Start Time 0958    PT Stop Time 1028    PT Time Calculation (min) 30 min                   Past Medical History:  Diagnosis Date   Abdominal abscess 11/30/2015   Anemia in chronic illness 12/16/2015   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   Eczema    GERD (gastroesophageal reflux disease)    Hx of radiation therapy 11/11/15- 01/05/2016   Base of Tongue and bilateral neck   Hx of radiation therapy 03/14/16- 03/23/16   Left Lower Lung   Hyperlipidemia    Hypertension    no meds   Infection with methicillin-resistant Staphylococcus aureus (MRSA) 11/30/2015   Intractable nausea and vomiting 11/13/2015   Laceration 04/2014   around R ear, for falling out of bed & hitting nightstand   Lung cancer (HCC) dx'd 01/2016   Nausea without vomiting 12/16/2015   Status post dilation of esophageal narrowing    Thalassemia minor    Throat cancer (HCC)    Throat pain in adult 12/29/2015   Past Surgical History:  Procedure Laterality Date   ABDOMINAL HYSTERECTOMY  1990   partial   DIRECT LARYNGOSCOPY N/A 10/28/2015   Procedure: DIRECT LARYNGOSCOPY WITH BIOPSY;  Surgeon: Marlyce Finer, MD;  Location: [facility] OR;  Service: ENT;  Laterality: N/A;   DIRECT LARYNGOSCOPY N/A 03/13/2017   Procedure: DIRECT LARYNGOSCOPY;  Surgeon: Finer Marlyce, MD;  Location: [facility];  Service: ENT;  Laterality: N/A;   DIRECT LARYNGOSCOPY N/A 01/18/2018   Procedure: DIRECT LARYNGOSCOPY;  Surgeon: Finer Marlyce, MD;  Location: [facility] OR;  Service: ENT;  Laterality: N/A;   ESOPHAGOSCOPY N/A 10/28/2015   Procedure: ESOPHAGOSCOPY;  Surgeon: Marlyce Finer, MD;  Location: [facility]  OR;  Service: ENT;  Laterality: N/A;   ESOPHAGOSCOPY WITH DILITATION N/A 03/13/2017   Procedure: ESOPHAGOSCOPY WITH DILITATION;  Surgeon: Finer Marlyce, MD;  Location: [facility];  Service: ENT;  Laterality: N/A;   ESOPHAGOSCOPY WITH DILITATION N/A 01/18/2018   Procedure: ESOPHAGOSCOPY WITH DILITATION POSSIBLE BOTOX ;  Surgeon: Finer Marlyce, MD;  Location: [facility] OR;  Service: ENT;  Laterality: N/A;   INCONTINENCE SURGERY  1990, [phone]   IR CV LINE INJECTION  06/28/2018   IR CV LINE INJECTION  07/23/2018   IR REMOVAL TUN ACCESS W/ PORT W/O FL MOD SED  03/21/2019   IR REMOVE CV FIBRIN SHEATH  07/23/2018   IR REPLACE G-TUBE SIMPLE WO FLUORO  06/19/2017   IR REPLACE G-TUBE SIMPLE WO FLUORO  09/05/2017   IR REPLACE G-TUBE SIMPLE WO FLUORO  12/21/2017   IR REPLC GASTRO/COLONIC TUBE PERCUT W/FLUORO  07/02/2018   IR US  GUIDE VASC ACCESS RIGHT  07/23/2018   LARYNGOSCOPY AND BRONCHOSCOPY N/A 10/28/2015   Procedure: BRONCHOSCOPY;  Surgeon: Marlyce Finer, MD;  Location: [facility] OR;  Service: ENT;  Laterality: N/A;   MULTIPLE EXTRACTIONS WITH ALVEOLOPLASTY N/A 10/28/2015   Procedure: Extraction of tooth #'s 2-12, 14,15,17,18,20-29, 31 with alveoloplasy and mandibular left torus reduction;  Surgeon: Tanda JULIANNA Fanny, DDS;  Location: [facility] OR;  Service: Oral Surgery;  Laterality: N/A;   port-a-cath insertion     RECTOCELE REPAIR     TONSILLECTOMY     as a child   UPPER GASTROINTESTINAL ENDOSCOPY     urocele     correction surgery   Patient Active Problem List   Diagnosis Date Noted   Syncope 04/28/2022   Urinary incontinence 04/28/2022   Subclinical hypothyroidism 04/05/2021   Lipoma 04/05/2021   Healthcare maintenance 04/05/2021   Elevated blood pressure reading 04/05/2021   Thyroid  nodule 02/28/2019   Acquired hypothyroidism 03/19/2018   Metastasis to lung (HCC) 09/02/2016   Pancytopenia, acquired (HCC) 09/02/2016   Preventive measure 09/02/2016   Lymphedema in adult patient  05/13/2016   Malignant neoplasm of lower lobe of left lung (HCC) 02/17/2016   Malnutrition of moderate degree 01/04/2016   Goals of care, counseling/discussion    Dysphagia 12/31/2015   Mucositis due to radiation therapy 12/31/2015   Odynophagia    Throat pain in adult 12/29/2015   Anemia in chronic illness 12/16/2015   Intractable nausea and vomiting 11/13/2015   Gastritis, acute 11/13/2015   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   Lipoma of axilla 03/21/2013   Sleep disorder 03/17/2013   Health care maintenance 03/17/2013   Health maintenance examination 10/17/2011   Thalassemia 10/17/2011   Essential hypertension 10/17/2011   Hyperlipidemia 10/17/2011   Headache 10/17/2011    PCP: Hadlei Stitt Bar, MD  REFERRING PROVIDER: Teressa Rainell BROCKS, DO   REFERRING DIAG: Bilateral primary osteoarthritis of hip [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 Pt stated that they have maintained fair compliance with current HEP.  Reports only occasionally performing HEP, hasn't tried implementing education surrounding pacing and posture at home.  EVAL- Pt reports pain is the worst in the neck and is worse in the AM.  Pain is mostly in the back of the neck starting a few years ago  with no specific onset. Pain improves after being up and moving around for a while. She notes holding up to do prolonged activities is hard for example mowing the lawn or reading.  Hips and knees have been  giving more issues about 6 weeks ago beginning of June. She reports nothing started this.  She reports about week ago the hip and knee pain has resolved, with some occasional L knee residual intermittent soreness, but notes the pain in her neck is the worst.   PERTINENT HISTORY:  Hx of Cx, see PMHx  PAIN:  Are you having pain?  Neck Yes: NPRS scale: 5/10 Last 24 hours: 2-10/10 Pain location: back of the neck  Pain description: ache, pulling  Aggravating factors: getting out of bed morning, prolonged looking down atactivities, lifting head Relieving factors: Tylenol ,   Hips / knees Yes: NPRS scale: 0/10 Last 24 hours: 0/10 Pain location: along the side of the hip Pain description: ache Aggravating factors: more activity / movement Relieving factors: Tylenol    PRECAUTIONS: Other: hx of cancer  RED FLAGS: None   WEIGHT BEARING RESTRICTIONS: No  FALLS:  Has patient fallen in last 6 months? Yes. Number of falls 3  LIVING ENVIRONMENT: Lives with: lives with their daughter Lives in: House/apartment Stairs: Yes: Internal: 12 steps; on left going up Has following equipment at home: None  OCCUPATION: retired  PLOF: Independent  PATIENT GOALS: be able to move again, get stuff done without relying on others.    OBJECTIVE:  Note: Objective measures were completed at Evaluation unless otherwise noted.  DIAGNOSTIC FINDINGS:  X-ray Cervical spine 03/26/24 IMPRESSION: Moderate degenerative disc disease at C6-7 and milder degenerative disc disease at C5-6.  X-ray bil knee 03/26/24 IMPRESSION: Mild degenerative changes of the knees.  PATIENT SURVEYS:  NDI: 22/50 NECK DISABILITY INDEX   Date: 7/15 Score 05/16/2024  Pain intensity 3 = The pain is fairly severe at the moment 1  2. Personal care (washing, dressing, etc.) 0 = I can look after myself normally without causing extra pain 0  3. Lifting 5 = I cannot lift or carry anything  0  4. Reading 2 =  I can read as much as I  want with moderate pain in my neck 3  5. Headaches 0 = I have no headaches at all 1  6. Concentration 0 =  I can concentrate fully when I want to with no difficulty 0  7. Work 2 = I can do most of my usual work, but no more 1  8. Driving 0 = I can drive my car without any neck pain 0  9. Sleeping 5 =  My sleep is completely disturbed (5-7 hrs sleepless)  3  10. Recreation 5 = I can't do any recreation activities at all 1  Total 22/50 10/50(20%)   Minimum Detectable Change (90% confidence): 5 points or 10% points  Reassessed on 05/07/24 NDI 9/50 = 18%  COGNITION: Overall cognitive status: Within functional limits for tasks assessed     SENSATION: Not tested   POSTURE: rounded shoulders, forward head, increased thoracic kyphosis, and    PALPATION: TTP with multiple trigger points along the bil cervicothoracic paraspinals with R>L, bil upper trap tightness, levator scapulae tenderness and sub-occipital tightness, increaed thoracic kyphosis  CERVICAL ROM:   Active ROM A/PROM (deg) 04/09/2024 05/16/2024  Flexion 40 60  Extension 20 40  Right lateral flexion 1 12  Left lateral flexion 10 12  Right rotation 55 60  Left rotation 55 60   (Blank rows = not tested)  Note: resting postioning is 12 degrees of flexion          Concordant sx noted with end range flexion.  UE ROM:  Active ROM Right 04/09/2024 Left 04/09/2024  Shoulder flexion [facility]  Shoulder extension [facility]  Shoulder abduction [facility] WFL  Shoulder adduction [facility]  Shoulder extension    Shoulder internal rotation [facility] WFL  Shoulder external rotation [facility] WFL  Elbow flexion    Elbow extension    Wrist flexion    Wrist extension    Wrist ulnar deviation    Wrist radial deviation    Wrist pronation    Wrist supination     (Blank rows = not tested)  UE MMT:  MMT Right 04/09/2024 Left 04/09/2024  Shoulder flexion 4 4-  Shoulder extension 4 4-  Shoulder abduction 4 4-  Shoulder adduction 4 4  Shoulder  extension    Shoulder internal rotation 4 4-  Shoulder external rotation 4 4-  Middle trapezius    Lower trapezius    Elbow flexion    Elbow extension    Wrist flexion    Wrist extension    Wrist ulnar deviation    Wrist radial deviation    Wrist pronation    Wrist supination    Grip strength     (Blank rows = not tested)  CERVICAL SPECIAL TESTS:  Cranial cervical flexion test: able to hold 20 seconds    LOWER EXTREMITY ROM:     Active  Right eval Left eval  Hip flexion    Hip extension    Hip abduction    Hip adduction    Hip internal rotation    Hip external rotation    Knee flexion    Knee extension    Ankle dorsiflexion    Ankle plantarflexion    Ankle inversion    Ankle eversion     (Blank rows = not tested)  LOWER EXTREMITY MMT:    MMT Right eval Left eval  Hip flexion    Hip extension    Hip abduction    Hip adduction    Hip internal rotation    Hip external rotation    Knee flexion    Knee extension    Ankle dorsiflexion    Ankle plantarflexion    Ankle inversion    Ankle eversion     (Blank rows = not tested)   GAIT: Distance walked: from waiting room to tx room Assistive device utilized: None Level of assistance: Complete Independence Comments: postural sway noted with limited trunk rotation   TREATMENT :  [facility] Adult PT Treatment:                                                DATE: 05/16/2024 Therapeutic Activity: Objective measures Goal assessment, functional testing Self Care: POC discussion Pt education   [facility] Adult PT Treatment:                                                DATE: 05/09/2024 Therapeutic Activity: NuStep 8' lvl 5 for activity tolerance doorway pec stretch at 30,60,90d of abduction and cervical rotation 1' ea. Anchored row with isometric chin tucks 2x15, hold 2s, BTB Thoracic ext over foam roller in supine 2x20, hold 2s Prone hold in neutral cervical with thoracic extension 5x30s, cue for chin  tuck/cervical posturing PRN  [facility] Adult PT Treatment:                                                DATE: 05/09/2024 Therapeutic Activity: NuStep 8' lvl 5 for activity tolerance Corner pec stretch at 30,60,90d of abduction 1' ea. Anchored row with isometric chin tucks 2x15, hold 2s, GTB Trap set at wall 2x12, hold 3s, 1st set in elevation, 2nd in depression OMEGA pull down 2x10, 30lbs    [facility] Adult PT Treatment:                                                DATE: 05/07/24 MTPR along bil upp trap Sub-occipital release UBE L 3 x 5 min  (FWD/ BWD x 2:30) Upper trap stretch 2 x 30 sec Posture education and benefits of taking short breaks Standing pec stretch in corner 2 x 30 sec Tools that can be purchased to assist with self TP release.   [facility] Adult PT Treatment:                                                DATE: 05/01/24 Nu-step L5 x 6 min LE only Sit to stand 1 x 10, 1 x 10 with RTB around knees Alternating marching while seated on ifiter 2 x 12 Palloff press 2 x 12 with GTB Alternating toe taps on 4 inch step 3 x 20  Rhomberg positoin 1 x 30 sec static, 1 x 30 sec with head turns Modified Tandem 3 x 20 sec alternating lead foot.  Standing forward reaching 3 x 10 with cone gradually pushed further requiring trunk lean   PATIENT EDUCATION:  Education details: evaluation findings, POC, goals, HEP with proper form/ rationale.  Person educated: Patient Education method: Explanation, Verbal cues, and Handouts Education comprehension: verbalized understanding  HOME EXERCISE PROGRAM: Access Code: [email or web address] URL: [email or web address] Date: 05/07/2024 Prepared by: Joneen Fresh  Exercises - Seated or Standing Cervical Retraction  - 1 x daily - 7 x weekly - 2 sets - 10 reps - 5 sec hold - Supine Chin Tuck with Towel  - 1 x daily - 7 x weekly - 2 sets - 10 reps - 10 hold - Supine DNF Liftoffs  - 1 x daily - 7 x weekly - 3 sets - 5 reps - 5-10 hold -  Thoracic Extension Mobilization with Noodle  - 1 x daily - 7 x weekly - 2 sets - 10 reps - 5 seconds hold - Seated Upper Trapezius Stretch  - 1 x daily - 7 x weekly - 2 sets - 2 reps - 30 seconds hold - Gentle Levator Scapulae Stretch  - 1 x daily - 7 x weekly - 2 sets - 2 reps - 30 sec  hold - Shoulder External Rotation and Scapular Retraction with Resistance  - 1 x daily - 7 x weekly - 3 sets - 10 reps - Seated Shoulder Horizontal Abduction with Resistance  - 1 x daily - 7 x weekly - 2 sets - 12 reps - Supine Pectoralis Stretch  - 2-3 x daily - 7  x weekly - 2 sets - 2 reps - 30-60 sec hold - Doorway Pec Stretch at 90 Degrees Abduction  - 2 x daily - 7 x weekly - 2 sets - 2 reps - 30 seconds hold - Corner Pec Major Stretch  - 2 x daily - 7 x weekly - 2 sets - 2 reps - 30 hold   For LE/ hips Access Code: [email or web address] URL: [email or web address] Date: 04/29/2024 Prepared by: Joneen Fresh  Exercises - Standing Hip Abduction with Resistance at Ankles and Counter Support  - 1 x daily - 7 x weekly - 2-3 sets - 10-15 reps - Standing March  - 1 x daily - 7 x weekly - 2-3 sets - 10-15 reps - Sit to Stand Without Arm Support  - 1 x daily - 7 x weekly - 2 sets - 10 reps - Corner Balance Feet Together With Eyes Open  - 1 x daily - 7 x weekly - 2 sets - 10 reps  ASSESSMENT:  CLINICAL IMPRESSION: Pt attended physical therapy session for re-evaluation of neck pain and postural dysfunction. Pt has met  7 goals and continues to work towards 3 others. Difficulties continue with general posture, at home application of postural education, and reported pain levels with cervical mobility. Pt required minimal cuing as well as no assistance for safe and appropriate performance of today's activities. Pt reports feeling great with BLE, and neck has improved however only wants to participate in therapy if we do nothing aside from STW to help minimize pain, pt is not interested in strengthening,  stretching, or at home application of education. Education was given on benefit of these interventions alongside STW, however STW itself is not a fix for symptoms in her case, Pt reported understanding and decided to request d/c and will look for massage therapy to better suite her goals. Education was given to continue applying ADL education from previous sessions as well as performing HEP as prescribed with freedom to progress as tolerated using previous education on modification and exercise dosage. Pt has displayed and verbalized competence regarding this education.     Pt attended physical therapy session for continuation of treatment regarding neck and postural dysfunction. Today's treatment focused on improvement of  postural endurance, shoulder girdle strength/motility, cervical stability,and thoracic mobility. Pt is progressing with reported pain levels at 0/10 today.  Pt showed good tolerance to administered treatment with no adverse effects by the end of session. Skilled intervention was utilized via activity modification for pt tolerance with task completion, functional progression/regression promoting best outcomes inline with current rehab goals, as well as minimal verbal/tactile cuing alongside no physical assistance for safe and appropriate performance of today's activities. Re-eval next visit    EVAL- Patient is a 78 y.o. F who was seen today for physical therapy evaluation and treatment for referral dx of bil hip OA, bil knee pain, and neck pain. Debra Farrell reports her hips/ knees started bothering her 6 weeks ago with no specific onset but has since reduced activity and noted she currently hasn't had any pain for over a week, with the exception of intermittent L knee pain. She reports her CC is Neck pain and limited activity as a result that is worst in the AM or with prolonged positioning/ activities. She demonstrates limited cervical extension and sidebending and is a resting position  of 12 degrees of flexion. She exhibits a classic upper cross syndrome with a forward head posture and forward rolled shoulders and significant thoracic kyphosis  which potentially could be attributed partially to her PMhx of throat and lung cx. She would benefit from physical therapy to reduce neck pain, strengthening of the posterior chain, maximize cervical and thoracic mobility, maximize postural efficiency and her overall function by addressing the deficits listed .  OBJECTIVE IMPAIRMENTS: decreased endurance, decreased ROM, increased fascial restrictions, increased muscle spasms, improper body mechanics, postural dysfunction, and pain.   ACTIVITY LIMITATIONS: carrying, lifting, and reading, yard work   PARTICIPATION LIMITATIONS: community activity and yard work  PERSONAL FACTORS: Age, Past/current experiences, and 1-2 comorbidities: hx of Cancer in both throat/ lung are also affecting patient's functional outcome.   REHAB POTENTIAL: Good  CLINICAL DECISION MAKING: Evolving/moderate complexity  EVALUATION COMPLEXITY: Moderate   GOALS: Goals reviewed with patient? Yes  SHORT TERM GOALS: Target date: 05/07/2024  Pt to be IND with initial HEP for therapeutic progression Baseline: Goal status: MET  2.  Pt to verbalize/ demo efficient posture in sitting/ standing to reduce stress/ strain on her neck/ thoracic spine.  Baseline:  Goal status: MET  3.  Pt to improve her cervical resting postion to </= 8 degrees of flexion to reduce stress on cervical paraspinal/ sub-occipitals  Baseline:  Goal status: ONGOING 05/16/2024  4.  Pt to report pain to </= 7/10 at max to demo improving condition Baseline:  Goal status: MET   LONG TERM GOALS: Target date: 06/04/2024  Improve cervical sidebending to >/= 15 degrees bil and maintain current cervical mobility with </= 2/10 max pain for safety with ADLs and driving Baseline: see objective charts Goal status: ONGOING (3/10)  2.  Pt to be able  to return to yard work, reading for  >/= 45 min reporting pain as </= slight (per the NDI) for pt's personal goals.  Baseline:  Goal status: MET 05/16/2024 (reports reading/working for hours at a time)  3.  Pt to be able to lift/ carrying items with report of minimal pain for returning to independent activity and maximize QOL Baseline:  Goal status: MET 05/16/2024  4.  Pt to be able to walk/ stand and navigate in home steps with no report of limitations with strength or feeling of balance for safety.  Baseline:  Goal status: MET 05/16/2024  5.  Improve NDI to </= 10/50 to demo a measureable improvement in her function.  Baseline:  Goal status: MET 05/16/2024  6.  Pt to be IND with all HEP and is able to maintain and progress their current LOF IND.  Baseline:  Goal status: ONGOING 05/16/2024  PLAN:  PT FREQUENCY: 1-2x/week  PT DURATION: 8 weeks  PLANNED INTERVENTIONS: 97110-Therapeutic exercises, 97530- Therapeutic activity, 97112- Neuromuscular re-education, 97535- Self Care, 02859- Manual therapy, [id number]- Gait training, [phone]- Traction (mechanical), [phone] (1-2 muscles), [id number] (3+ muscles)- Dry Needling, Taping, Spinal mobilization, Cryotherapy, and Moist heat.  PLAN FOR NEXT SESSION: review/ update HEP PRN, posterior chain activation/mobility, pec stretch,STW along cervical paraspinals.    PHYSICAL THERAPY DISCHARGE SUMMARY  Visits from Start of Care: 10  Current functional level related to goals / functional outcomes: See assessment   Remaining deficits: See assessment   Education / Equipment: See assessment   Patient agrees to discharge. Patient goals were partially met. Patient is being discharged due to being pleased with the current functional level.   Mabel Kiang, PT, DPT 05/16/2024, 10:28 AM

## 2024-05-21 ENCOUNTER — Encounter: Admitting: Physical Therapy

## 2024-05-23 ENCOUNTER — Ambulatory Visit: Admitting: Physical Therapy

## 2024-05-28 ENCOUNTER — Encounter: Admitting: Physical Therapy

## 2024-05-30 ENCOUNTER — Encounter: Admitting: Physical Therapy

## 2024-06-03 ENCOUNTER — Encounter: Admitting: Physical Therapy

## 2024-06-05 ENCOUNTER — Encounter: Admitting: Physical Therapy

## 2024-07-25 ENCOUNTER — Telehealth: Payer: Self-pay

## 2024-07-25 NOTE — Telephone Encounter (Signed)
 Patient is overdue for an appointment and has [facility] listed as PCP. LMOM for patient to call and schedule.

## 2024-08-12 NOTE — Telephone Encounter (Signed)
 Patient is on the [facility] list as not completing a PCP visit for 2025. PCP listed is [facility].   LVM for pt to call back as soon as possible.   RE: Schedule appt with Nicholas Bar, MD  if patient does not have a pcp.   If patient does have a PCP, please have them contact [employer] to update their provider with [facility].
